# Patient Record
Sex: Male | Born: 1965 | Race: Black or African American | Hispanic: No | Marital: Single
Health system: Southern US, Community
[De-identification: ages and names within clinical notes are randomized; demographics above are authoritative.]

## PROBLEM LIST (undated history)

## (undated) DIAGNOSIS — K635 Polyp of colon: Secondary | ICD-10-CM

## (undated) DIAGNOSIS — I420 Dilated cardiomyopathy: Secondary | ICD-10-CM

## (undated) DIAGNOSIS — I5022 Chronic systolic (congestive) heart failure: Secondary | ICD-10-CM

## (undated) DIAGNOSIS — G629 Polyneuropathy, unspecified: Secondary | ICD-10-CM

## (undated) DIAGNOSIS — F1010 Alcohol abuse, uncomplicated: Secondary | ICD-10-CM

## (undated) DIAGNOSIS — I10 Essential (primary) hypertension: Secondary | ICD-10-CM

## (undated) DIAGNOSIS — Z85038 Personal history of other malignant neoplasm of large intestine: Secondary | ICD-10-CM

## (undated) DIAGNOSIS — K7460 Unspecified cirrhosis of liver: Secondary | ICD-10-CM

## (undated) DIAGNOSIS — I471 Supraventricular tachycardia, unspecified: Secondary | ICD-10-CM

## (undated) DIAGNOSIS — K279 Peptic ulcer, site unspecified, unspecified as acute or chronic, without hemorrhage or perforation: Secondary | ICD-10-CM

## (undated) DIAGNOSIS — R9431 Abnormal electrocardiogram [ECG] [EKG]: Secondary | ICD-10-CM

## (undated) DIAGNOSIS — R931 Abnormal findings on diagnostic imaging of heart and coronary circulation: Secondary | ICD-10-CM

## (undated) DIAGNOSIS — R079 Chest pain, unspecified: Secondary | ICD-10-CM

## (undated) DIAGNOSIS — K219 Gastro-esophageal reflux disease without esophagitis: Secondary | ICD-10-CM

## (undated) DIAGNOSIS — C189 Malignant neoplasm of colon, unspecified: Secondary | ICD-10-CM

## (undated) DIAGNOSIS — K859 Acute pancreatitis without necrosis or infection, unspecified: Secondary | ICD-10-CM

## (undated) DIAGNOSIS — K7010 Alcoholic hepatitis without ascites: Secondary | ICD-10-CM

## (undated) HISTORY — DX: Chest pain, unspecified: R07.9

## (undated) HISTORY — DX: Personal history of other malignant neoplasm of large intestine: Z85.038

## (undated) HISTORY — DX: Dilated cardiomyopathy: I42.0

## (undated) HISTORY — DX: Supraventricular tachycardia, unspecified: I47.10

## (undated) HISTORY — DX: Abnormal electrocardiogram (ECG) (EKG): R94.31

## (undated) HISTORY — DX: Supraventricular tachycardia: I47.1

## (undated) HISTORY — DX: Other disorders of phosphorus metabolism: E83.39

## (undated) HISTORY — DX: Alcoholic hepatitis without ascites: K70.10

## (undated) HISTORY — DX: Abnormal findings on diagnostic imaging of heart and coronary circulation: R93.1

## (undated) HISTORY — DX: Hypomagnesemia: E83.42

## (undated) HISTORY — DX: Polyp of colon: K63.5

## (undated) HISTORY — DX: Unspecified cirrhosis of liver: K74.60

## (undated) HISTORY — DX: Chronic systolic (congestive) heart failure: I50.22

---
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
## 1998-01-31 ENCOUNTER — Emergency Department (HOSPITAL_COMMUNITY): Admission: EM | Admit: 1998-01-31 | Discharge: 1998-01-31 | Payer: Self-pay | Admitting: Emergency Medicine

## 1999-05-11 ENCOUNTER — Encounter: Payer: Self-pay | Admitting: Emergency Medicine

## 1999-05-11 ENCOUNTER — Emergency Department (HOSPITAL_COMMUNITY): Admission: EM | Admit: 1999-05-11 | Discharge: 1999-05-11 | Payer: Self-pay | Admitting: Emergency Medicine

## 1999-08-18 ENCOUNTER — Emergency Department (HOSPITAL_COMMUNITY): Admission: EM | Admit: 1999-08-18 | Discharge: 1999-08-18 | Payer: Self-pay | Admitting: Emergency Medicine

## 1999-12-26 ENCOUNTER — Emergency Department (HOSPITAL_COMMUNITY): Admission: EM | Admit: 1999-12-26 | Discharge: 1999-12-27 | Payer: Self-pay | Admitting: Emergency Medicine

## 2001-02-16 ENCOUNTER — Emergency Department (HOSPITAL_COMMUNITY): Admission: EM | Admit: 2001-02-16 | Discharge: 2001-02-16 | Payer: Self-pay | Admitting: Emergency Medicine

## 2001-02-16 ENCOUNTER — Encounter: Payer: Self-pay | Admitting: Emergency Medicine

## 2003-10-20 ENCOUNTER — Emergency Department (HOSPITAL_COMMUNITY): Admission: EM | Admit: 2003-10-20 | Discharge: 2003-10-20 | Payer: Self-pay | Admitting: Emergency Medicine

## 2005-10-03 HISTORY — PX: OTHER SURGICAL HISTORY: SHX169

## 2005-10-03 HISTORY — PX: LAPAROSCOPIC SIGMOID COLECTOMY: SHX5928

## 2005-12-06 ENCOUNTER — Ambulatory Visit: Payer: Self-pay | Admitting: Internal Medicine

## 2005-12-06 ENCOUNTER — Ambulatory Visit: Payer: Self-pay | Admitting: Infectious Diseases

## 2005-12-06 ENCOUNTER — Inpatient Hospital Stay (HOSPITAL_COMMUNITY): Admission: EM | Admit: 2005-12-06 | Discharge: 2005-12-13 | Payer: Self-pay | Admitting: *Deleted

## 2005-12-07 ENCOUNTER — Encounter (INDEPENDENT_AMBULATORY_CARE_PROVIDER_SITE_OTHER): Payer: Self-pay | Admitting: Specialist

## 2005-12-08 ENCOUNTER — Encounter (INDEPENDENT_AMBULATORY_CARE_PROVIDER_SITE_OTHER): Payer: Self-pay | Admitting: *Deleted

## 2005-12-13 ENCOUNTER — Ambulatory Visit: Payer: Self-pay | Admitting: Hematology and Oncology

## 2006-01-05 ENCOUNTER — Emergency Department (HOSPITAL_COMMUNITY): Admission: EM | Admit: 2006-01-05 | Discharge: 2006-01-05 | Payer: Self-pay | Admitting: Emergency Medicine

## 2006-01-10 LAB — CBC WITH DIFFERENTIAL/PLATELET
BASO%: 1.8 % (ref 0.0–2.0)
Basophils Absolute: 0.1 10*3/uL (ref 0.0–0.1)
HCT: 47.2 % (ref 38.7–49.9)
HGB: 16 g/dL (ref 13.0–17.1)
MONO#: 0.4 10*3/uL (ref 0.1–0.9)
NEUT#: 3.1 10*3/uL (ref 1.5–6.5)
NEUT%: 61.9 % (ref 40.0–75.0)
WBC: 5.1 10*3/uL (ref 4.0–10.0)
lymph#: 1.3 10*3/uL (ref 0.9–3.3)

## 2006-01-10 LAB — COMPREHENSIVE METABOLIC PANEL WITH GFR
ALT: 8 U/L (ref 0–40)
AST: 15 U/L (ref 0–37)
Albumin: 4.7 g/dL (ref 3.5–5.2)
Alkaline Phosphatase: 55 U/L (ref 39–117)
BUN: 11 mg/dL (ref 6–23)
CO2: 26 meq/L (ref 19–32)
Calcium: 9.9 mg/dL (ref 8.4–10.5)
Chloride: 104 meq/L (ref 96–112)
Creatinine, Ser: 0.9 mg/dL (ref 0.4–1.5)
Glucose, Bld: 85 mg/dL (ref 70–99)
Potassium: 4.1 meq/L (ref 3.5–5.3)
Sodium: 140 meq/L (ref 135–145)
Total Bilirubin: 0.6 mg/dL (ref 0.3–1.2)
Total Protein: 7.8 g/dL (ref 6.0–8.3)

## 2006-01-10 LAB — CEA: CEA: 2.4 ng/mL (ref 0.0–5.0)

## 2006-01-19 ENCOUNTER — Ambulatory Visit (HOSPITAL_BASED_OUTPATIENT_CLINIC_OR_DEPARTMENT_OTHER): Admission: RE | Admit: 2006-01-19 | Discharge: 2006-01-19 | Payer: Self-pay | Admitting: General Surgery

## 2006-02-05 ENCOUNTER — Ambulatory Visit: Payer: Self-pay | Admitting: Hematology and Oncology

## 2006-02-08 LAB — CBC WITH DIFFERENTIAL/PLATELET
Basophils Absolute: 0 10*3/uL (ref 0.0–0.1)
EOS%: 2.2 % (ref 0.0–7.0)
HCT: 44.9 % (ref 38.7–49.9)
HGB: 15.5 g/dL (ref 13.0–17.1)
LYMPH%: 29.2 % (ref 14.0–48.0)
MCH: 32.6 pg (ref 28.0–33.4)
MCV: 94.2 fL (ref 81.6–98.0)
MONO%: 13.2 % — ABNORMAL HIGH (ref 0.0–13.0)
NEUT%: 54.9 % (ref 40.0–75.0)

## 2006-02-08 LAB — COMPREHENSIVE METABOLIC PANEL
AST: 27 U/L (ref 0–37)
Alkaline Phosphatase: 54 U/L (ref 39–117)
BUN: 8 mg/dL (ref 6–23)
Creatinine, Ser: 0.9 mg/dL (ref 0.4–1.5)

## 2006-03-01 LAB — COMPREHENSIVE METABOLIC PANEL
Albumin: 4.5 g/dL (ref 3.5–5.2)
CO2: 24 mEq/L (ref 19–32)
Calcium: 9.8 mg/dL (ref 8.4–10.5)
Chloride: 101 mEq/L (ref 96–112)
Glucose, Bld: 123 mg/dL — ABNORMAL HIGH (ref 70–99)
Sodium: 134 mEq/L — ABNORMAL LOW (ref 135–145)
Total Bilirubin: 0.5 mg/dL (ref 0.3–1.2)
Total Protein: 7.6 g/dL (ref 6.0–8.3)

## 2006-03-01 LAB — CBC WITH DIFFERENTIAL/PLATELET
Eosinophils Absolute: 0.1 10*3/uL (ref 0.0–0.5)
HCT: 44.7 % (ref 38.7–49.9)
LYMPH%: 28 % (ref 14.0–48.0)
MONO#: 0.5 10*3/uL (ref 0.1–0.9)
NEUT#: 1.4 10*3/uL — ABNORMAL LOW (ref 1.5–6.5)
NEUT%: 48.8 % (ref 40.0–75.0)
Platelets: 200 10*3/uL (ref 145–400)
RBC: 4.74 10*6/uL (ref 4.20–5.71)
WBC: 2.8 10*3/uL — ABNORMAL LOW (ref 4.0–10.0)
lymph#: 0.8 10*3/uL — ABNORMAL LOW (ref 0.9–3.3)

## 2006-03-01 LAB — MAGNESIUM: Magnesium: 2.8 mg/dL — ABNORMAL HIGH (ref 1.5–2.5)

## 2006-03-17 ENCOUNTER — Ambulatory Visit: Payer: Self-pay | Admitting: Hematology and Oncology

## 2006-03-28 LAB — CBC WITH DIFFERENTIAL/PLATELET
BASO%: 1 % (ref 0.0–2.0)
Basophils Absolute: 0.1 10*3/uL (ref 0.0–0.1)
EOS%: 1.2 % (ref 0.0–7.0)
HCT: 45 % (ref 38.7–49.9)
HGB: 15.5 g/dL (ref 13.0–17.1)
MCH: 32.9 pg (ref 28.0–33.4)
MCHC: 34.4 g/dL (ref 32.0–35.9)
MONO#: 1.4 10*3/uL — ABNORMAL HIGH (ref 0.1–0.9)
NEUT%: 63.8 % (ref 40.0–75.0)
RDW: 14.3 % (ref 11.2–14.6)
WBC: 8.9 10*3/uL (ref 4.0–10.0)
lymph#: 1.6 10*3/uL (ref 0.9–3.3)

## 2006-03-28 LAB — COMPREHENSIVE METABOLIC PANEL
ALT: 9 U/L (ref 0–40)
AST: 16 U/L (ref 0–37)
Albumin: 4.4 g/dL (ref 3.5–5.2)
CO2: 23 mEq/L (ref 19–32)
Calcium: 9.2 mg/dL (ref 8.4–10.5)
Chloride: 107 mEq/L (ref 96–112)
Potassium: 4.3 mEq/L (ref 3.5–5.3)
Total Protein: 7.1 g/dL (ref 6.0–8.3)

## 2006-04-11 LAB — CBC WITH DIFFERENTIAL/PLATELET
BASO%: 0.6 % (ref 0.0–2.0)
EOS%: 1.9 % (ref 0.0–7.0)
HCT: 45.6 % (ref 38.7–49.9)
MCH: 33 pg (ref 28.0–33.4)
MCHC: 34.1 g/dL (ref 32.0–35.9)
MONO#: 1.1 10*3/uL — ABNORMAL HIGH (ref 0.1–0.9)
NEUT%: 62.8 % (ref 40.0–75.0)
RBC: 4.71 10*6/uL (ref 4.20–5.71)
WBC: 8.1 10*3/uL (ref 4.0–10.0)
lymph#: 1.7 10*3/uL (ref 0.9–3.3)

## 2006-04-11 LAB — COMPREHENSIVE METABOLIC PANEL
ALT: 10 U/L (ref 0–40)
AST: 16 U/L (ref 0–37)
Albumin: 4.5 g/dL (ref 3.5–5.2)
CO2: 24 mEq/L (ref 19–32)
Calcium: 9.1 mg/dL (ref 8.4–10.5)
Chloride: 105 mEq/L (ref 96–112)
Potassium: 4.1 mEq/L (ref 3.5–5.3)
Sodium: 139 mEq/L (ref 135–145)
Total Protein: 7.2 g/dL (ref 6.0–8.3)

## 2006-05-05 ENCOUNTER — Ambulatory Visit: Payer: Self-pay | Admitting: Hematology and Oncology

## 2006-05-09 LAB — COMPREHENSIVE METABOLIC PANEL
ALT: 16 U/L (ref 0–40)
AST: 21 U/L (ref 0–37)
Calcium: 9.8 mg/dL (ref 8.4–10.5)
Creatinine, Ser: 0.9 mg/dL (ref 0.40–1.50)
Glucose, Bld: 102 mg/dL — ABNORMAL HIGH (ref 70–99)
Potassium: 4.5 mEq/L (ref 3.5–5.3)
Total Bilirubin: 0.7 mg/dL (ref 0.3–1.2)
Total Protein: 7.5 g/dL (ref 6.0–8.3)

## 2006-05-09 LAB — CBC WITH DIFFERENTIAL/PLATELET
Basophils Absolute: 0 10*3/uL (ref 0.0–0.1)
Eosinophils Absolute: 0.1 10*3/uL (ref 0.0–0.5)
HCT: 45.1 % (ref 38.7–49.9)
LYMPH%: 12.4 % — ABNORMAL LOW (ref 14.0–48.0)
MCH: 34.8 pg — ABNORMAL HIGH (ref 28.0–33.4)
MCHC: 34.4 g/dL (ref 32.0–35.9)
MONO#: 0.3 10*3/uL (ref 0.1–0.9)
MONO%: 6 % (ref 0.0–13.0)
NEUT%: 79.7 % — ABNORMAL HIGH (ref 40.0–75.0)
Platelets: 219 10*3/uL (ref 145–400)
RDW: 13.3 % (ref 11.2–14.6)
lymph#: 0.7 10*3/uL — ABNORMAL LOW (ref 0.9–3.3)

## 2006-05-22 LAB — CBC WITH DIFFERENTIAL/PLATELET
LYMPH%: 33.6 % (ref 14.0–48.0)
MONO#: 0.7 10*3/uL (ref 0.1–0.9)
MONO%: 15.4 % — ABNORMAL HIGH (ref 0.0–13.0)
Platelets: 196 10*3/uL (ref 145–400)
WBC: 4.4 10*3/uL (ref 4.0–10.0)

## 2006-05-22 LAB — COMPREHENSIVE METABOLIC PANEL
ALT: 16 U/L (ref 0–40)
AST: 24 U/L (ref 0–37)
Albumin: 4.5 g/dL (ref 3.5–5.2)
CO2: 26 mEq/L (ref 19–32)
Chloride: 103 mEq/L (ref 96–112)
Creatinine, Ser: 0.99 mg/dL (ref 0.40–1.50)
Glucose, Bld: 108 mg/dL — ABNORMAL HIGH (ref 70–99)
Total Protein: 7.3 g/dL (ref 6.0–8.3)

## 2006-06-19 ENCOUNTER — Ambulatory Visit: Payer: Self-pay | Admitting: Hematology and Oncology

## 2006-06-19 LAB — COMPREHENSIVE METABOLIC PANEL
AST: 19 U/L (ref 0–37)
Albumin: 4.9 g/dL (ref 3.5–5.2)
BUN: 9 mg/dL (ref 6–23)
CO2: 24 mEq/L (ref 19–32)
Calcium: 10.4 mg/dL (ref 8.4–10.5)
Chloride: 104 mEq/L (ref 96–112)
Creatinine, Ser: 0.93 mg/dL (ref 0.40–1.50)
Glucose, Bld: 87 mg/dL (ref 70–99)
Potassium: 4.4 mEq/L (ref 3.5–5.3)

## 2006-06-19 LAB — CBC WITH DIFFERENTIAL/PLATELET
Basophils Absolute: 0 10*3/uL (ref 0.0–0.1)
EOS%: 1.1 % (ref 0.0–7.0)
HCT: 49.8 % (ref 38.7–49.9)
HGB: 17.2 g/dL — ABNORMAL HIGH (ref 13.0–17.1)
LYMPH%: 31.2 % (ref 14.0–48.0)
MCHC: 34.6 g/dL (ref 32.0–35.9)
NEUT%: 52.2 % (ref 40.0–75.0)
RBC: 4.93 10*6/uL (ref 4.20–5.71)
RDW: 13.1 % (ref 11.2–14.6)
WBC: 4.6 10*3/uL (ref 4.0–10.0)

## 2006-10-16 ENCOUNTER — Ambulatory Visit: Payer: Self-pay | Admitting: Hematology and Oncology

## 2007-08-31 ENCOUNTER — Ambulatory Visit (HOSPITAL_COMMUNITY): Admission: RE | Admit: 2007-08-31 | Discharge: 2007-08-31 | Payer: Self-pay | Admitting: Hematology and Oncology

## 2007-08-31 ENCOUNTER — Ambulatory Visit: Payer: Self-pay | Admitting: Hematology and Oncology

## 2007-08-31 LAB — CBC WITH DIFFERENTIAL/PLATELET
BASO%: 0.5 % (ref 0.0–2.0)
Basophils Absolute: 0 10*3/uL (ref 0.0–0.1)
EOS%: 1.8 % (ref 0.0–7.0)
HGB: 15.6 g/dL (ref 13.0–17.1)
MCH: 34.1 pg — ABNORMAL HIGH (ref 28.0–33.4)
MCHC: 35.1 g/dL (ref 32.0–35.9)
RBC: 4.57 10*6/uL (ref 4.20–5.71)
RDW: 13 % (ref 11.2–14.6)
lymph#: 1.6 10*3/uL (ref 0.9–3.3)

## 2007-08-31 LAB — COMPREHENSIVE METABOLIC PANEL
Alkaline Phosphatase: 52 U/L (ref 39–117)
Glucose, Bld: 96 mg/dL (ref 70–99)
Sodium: 135 mEq/L (ref 135–145)
Total Bilirubin: 0.8 mg/dL (ref 0.3–1.2)
Total Protein: 7.9 g/dL (ref 6.0–8.3)

## 2007-10-15 ENCOUNTER — Ambulatory Visit (HOSPITAL_COMMUNITY): Admission: RE | Admit: 2007-10-15 | Discharge: 2007-10-15 | Payer: Self-pay | Admitting: General Surgery

## 2007-10-22 ENCOUNTER — Ambulatory Visit: Payer: Self-pay | Admitting: Internal Medicine

## 2007-10-29 ENCOUNTER — Ambulatory Visit: Payer: Self-pay | Admitting: Internal Medicine

## 2008-03-05 ENCOUNTER — Ambulatory Visit: Payer: Self-pay | Admitting: Hematology and Oncology

## 2009-01-13 ENCOUNTER — Emergency Department (HOSPITAL_COMMUNITY): Admission: EM | Admit: 2009-01-13 | Discharge: 2009-01-13 | Payer: Self-pay | Admitting: Family Medicine

## 2010-02-18 ENCOUNTER — Emergency Department (HOSPITAL_COMMUNITY): Admission: EM | Admit: 2010-02-18 | Discharge: 2010-02-18 | Payer: Self-pay | Admitting: Emergency Medicine

## 2010-10-03 HISTORY — PX: OPEN REDUCTION INTERNAL FIXATION (ORIF) HAND: SHX5991

## 2010-10-24 ENCOUNTER — Encounter: Payer: Self-pay | Admitting: Hematology and Oncology

## 2011-01-12 LAB — COMPREHENSIVE METABOLIC PANEL
ALT: 102 U/L — ABNORMAL HIGH (ref 0–53)
AST: 195 U/L — ABNORMAL HIGH (ref 0–37)
Albumin: 4 g/dL (ref 3.5–5.2)
BUN: 8 mg/dL (ref 6–23)
CO2: 23 mEq/L (ref 19–32)
Creatinine, Ser: 0.96 mg/dL (ref 0.4–1.5)
GFR calc Af Amer: >60 mL/min (ref >60)
GFR calc non Af Amer: >60 mL/min (ref >60)
Potassium: 4.1 mEq/L (ref 3.5–5.1)
Sodium: 135 mEq/L (ref 135–145)
Total Bilirubin: 0.7 mg/dL (ref 0.3–1.2)
Total Protein: 7.6 g/dL (ref 6.0–8.3)

## 2011-01-12 LAB — DIFFERENTIAL
Lymphocytes Relative: 24 % (ref 12–46)
Lymphs Abs: 1 10*3/uL (ref 0.7–4.0)
Monocytes Relative: 15 % — ABNORMAL HIGH (ref 3–12)
Neutrophils Relative %: 59 % (ref 43–77)

## 2011-01-12 LAB — POCT URINALYSIS DIP (DEVICE)
Glucose, UA: NEGATIVE mg/dL
Hgb urine dipstick: NEGATIVE
Ketones, ur: 15 mg/dL — AB
Nitrite: NEGATIVE
Specific Gravity, Urine: 1.02 (ref 1.005–1.030)
pH: 5.5 (ref 5.0–8.0)

## 2011-01-12 LAB — CBC
MCV: 98.8 fL (ref 78.0–100.0)
Platelets: 150 10*3/uL (ref 150–400)
WBC: 3.9 10*3/uL — ABNORMAL LOW (ref 4.0–10.5)

## 2011-02-15 NOTE — Op Note (Signed)
NAMEMAHKI, SPIKES                 ACCOUNT NO.:  [id number]   MEDICAL RECORD NO.:  [id number]          PATIENT TYPE:  AMB   LOCATION:  DAY                          FACILITY:  [facility]   PHYSICIAN:  Adolph Pollack, M.D.DATE OF BIRTH:  12-28-1965   DATE OF PROCEDURE:  10/15/2007  DATE OF DISCHARGE:                               OPERATIVE REPORT   PREOPERATIVE DIAGNOSIS:  Retained Port-A-Cath.   POSTOPERATIVE DIAGNOSIS:  Retained Port-A-Cath.   PROCEDURE:  Port-A-Cath removal.   SURGEON:  Adolph Pollack, M.D.   ANESTHESIA:  MAC plus local (1% lidocaine).   INDICATIONS:  A 41-year male had colon cancer, required chemotherapy,  and has a retained Port-A-Cath in.  He does not require any further  therapy at this time, thus he is here to have Port-A-Cath removal.  I  discussed the procedure risks preoperatively.   TECHNIQUE:  He was seen in the holding area and the left chest with  marked my initials just above the port site.  He was then brought to the  operating room, placed supine on the operating table, and given  intravenous sedation.  The left upper chest was sterilely prepped and  draped.  Local anesthetic was infiltrated at the site of the previous  chest wall incision superficially and deep.  The previous incision was  reincised through skin and subcutaneous tissue.  The catheter was  identified and the fibrin sheath dissected free from it.  The catheter  was then removed from the left subclavian vein and direct pressure was  held at this area for 10 minutes.  Using electrocautery I then dissected  the port free from the fibrous capsule and underlying subcutaneous  tissue and the catheter and the port were removed in their entirety.   I then injected Marcaine solution into the deep area of the wound.   Hemostasis was obtained using electrocautery.  Once hemostasis was  adequate, the wound was closed in two layers.  The subcutaneous tissue  was approximated with a  running 2-0 Vicryl suture and skin closed with a  4-0 Monocryl subcuticular stitch.  Steri-Strips and sterile dressings  were applied.   He tolerated the procedure well without any apparent complications.  He  was taken to recovery in satisfactory condition.      Adolph Pollack, M.D.  Electronically Signed    TJR/MEDQ  D:  10/15/2007  T:  10/16/2007  Job:  [id number]

## 2011-02-18 NOTE — Discharge Summary (Signed)
NAMEBRAIDON, CHERMAK                 ACCOUNT NO.:  [id number]   MEDICAL RECORD NO.:  [id number]          PATIENT TYPE:  INP   LOCATION:  0981                         FACILITY:  [facility]   PHYSICIAN:  Ronda Fairly, M.D.    DATE OF BIRTH:  08-Feb-1966   DATE OF ADMISSION:  12/06/2005  DATE OF DISCHARGE:  12/13/2005                                 DISCHARGE SUMMARY   DISCHARGE DIAGNOSES:  1.  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  2.  Polysubstance abuse.   DISCHARGE MEDICATIONS:  1.  Protonix 40 mg daily.  2.  Nicotine patch daily.  3.  Carafate 1 g q. 6 h p.r.n. for abdominal distention.   CONDITION ON DISCHARGE AND FOLLOWUP:  The patient was stable at time of  discharge.  He had recovered very well from his abdominal surgery.  He will  follow up with Dr. Abbey Chatters from general surgery, and he also has a  followup with Dr. Dalene Carrow at the [facility] in 2 to 4 weeks'  time.  The patient will be called  for the date.   PROCEDURES AND IMPORTANT DIAGNOSTIC STUDIES:  1.  Colonoscopy was done which showed sigmoid diverticula but most important      a large bleeding obstructing mass of the descending colon with      appearance consistent with carcinoma.  2.  CT scan of the abdomen showed area of focal thickening at the splenic      flexure that was concerning for a neoplasm.   ADMISSION HISTORY AND PHYSICAL:  Mr. Jaquith is a 45 year old African-American  male with a known family history of colon cancer who was admitted on March 6  with history of abdominal pain and a 3-week history of black tarry stools  which had become severe for the last 1 to 2 days.  He also had a history of  black tarry stools for the same period. He gives a history of about a 10-  pound weight loss during the last couple of months.  He has been previously  treated with Nexium for epigastric pain, but he stopped taking that some  time ago.   ALLERGIES:  PENICILLIN.  He has hives.   PAST MEDICAL HISTORY:  1.  Peptic ulcer disease with ulceration beginning at the age of 45 but no      EGD done.  2.  History of alcohol abuse.  3.  History of hernia repair at the age of 45.  4.  History of GERD.   MEDICATIONS:  Nexium 40 mg daily which he stopped taking 2 years ago.   SUBSTANCE HISTORY:  He currently smokes 1 pack per day for the last 18  years.  Alcohol: 3 to 4 drinks a day for the past 5 to 6 months.  Smokes  marijuana occasionally.  Is single, has 11th grade education, is self paid,  lives with his girlfriend.   FAMILY HISTORY:  Significant for colon cancer in his father.   REVIEW OF SYSTEMS:  Positive for weight loss.  Over the last 5 years, he has  lost about 35 pounds.  History of abdominal pain, nausea, and poor appetite.   PHYSICAL EXAMINATION:  VITAL SIGNS:  Temperature 97.6, blood pressure  174/135, pulse 71, respiratory rate 22, O2 saturation 100%.  GENERAL:  Not in acute distress.  EYES:  Pupils equal and reactive.  Extraocular movements intact.  ENT: No thrush, no tonsillar adenopathy.  NECK: Supple, no JVD or thyromegaly.  RESPIRATORY:  Clear to auscultation bilaterally.  CARDIOVASCULAR: Regular rate and rhythm.  ABDOMEN: Soft, generalized diffuse tenderness on deep palpation noted.  No  guarding. Bowel sounds present.  RECTAL:  Fecal occult blood positive, maroon colored stools with red  streaks.  SKIN:  Normal.  NEUROLOGIC: Normal.   ADMISSION LABORATORY DATA:  Hemoglobin 18.5, hematocrit 4.8, WBC 5.8,  platelets 250, ANC 3.3, MCV 100.  Sodium 138, potassium 2.8, chloride 101,  bicarb 26, BUN 7, creatinine 1.1, glucose 80.  Bilirubin 1.1, alkaline  phosphatase 62, SGOT 24, SGPT 14, protein 10.7, albumin 3.9, calcium 9.1.  lipase 20.  Fecal occult blood positive.  PT 13.1, INR 1.   HOSPITAL COURSE:  #1.  ADENOCARCINOMA OF THE COLON STATUS POST COLECTOMY: Mr. Osgood is a 45-  year-old  male with a family history of colon cancer and a history of tobacco  and alcohol abuse, presents with a history of abdominal pain, black tarry  stools, and weight loss.  In the ED, a CT scan of the abdomen was done which  showed an area of focal thinning of the splenic flexure of the colon that is  concerning for neoplasm.  GI was consulted.  Dr. Marina Goodell saw him, and patient  underwent colonoscopy which revealed a large, bleeding, obstructing mass of  the descending colon with appearance consistent with carcinoma.  Biopsy of  the mass was taken.  Dr. Abbey Chatters, from general surgery, did left  colectomy and also mobilization of the splenic flexure.  The patient  tolerated the procedure well.  Post surgery, he did have some abdominal  distention and an ileus which gradually resolved during the course of  hospitalization.  At the time of discharge, he had recovered significantly  from the surgery with normal vital signs, was tolerating p.o. as well and  was pain free off the pain medications.  Dr. Dalene Carrow, from the [facility], was consulted with regard to further management of the colon  cancer.  By that time, the biopsy results had come back which showed  invasive adenocarcinoma of the left colon.  The sigmoid colon was negative.  The mass was about 6.5 cm moderately differentiated adenocarcinoma with  focal mucinous features.  The surgical margins were free of tumor.  The 18-  30 __________  lymph nodes were free of tumor.  This was most likely T3, N0,  MX moderately differentiated adenocarcinoma of the colon with mucinous  features.  As Dr. Dalene Carrow will see the patient in about 3 to 4 weeks' time  for further management.  The patient will be called for the interview date.  Mr. Freeland will probably need endoscopy in the future because he main  complaint at the time of admission was black tarry stools which, considering history of GERD, he could have an ulcer, although the H. pylori  test was  negative.   Mr. Pittinger will also need a repeat colonoscopy to search for synchronous and  metachronous lesions beyond the site of the tumor.   #2.  ALCOHOL ABUSE:  Mr. Schwarz has a significant history of alcohol and  tobacco abuse.  During the hospitalization, he promised he would stop  drinking and smoking.  He was put on a nicotine patch which he tolerated  well.   DISCHARGE LABORATORY DATA AND VITAL SIGNS:  At the time of discharge, Mr.  Ambrocio was clinically stable.  His blood pressure was 130/78, pulse 77,  temperature 97.4.  Hemoglobin 14.1, hematocrit 40.8, WBC 4.5, platelets 305.  Sodium 136, potassium 4, chloride 104, bicarb 27, BUN 2, creatinine 0.9,  glucose 80.  He is being sent home with the understanding that he would  follow up with Dr. Dalene Carrow at the [facility] in 3 weeks' time.      Ronda Fairly, M.D.     Margreta Journey  D:  01/25/2006  T:  01/25/2006  Job:  [id number]   cc:   Adolph Pollack, M.D.  [address], [address]  [place]  [place] [address]   Wilhemina Bonito. Marina Goodell, M.D. [facility]  [address]  [place]  [place] [address]   Vicente Serene I. Odogwu, M.D.  Fax: [phone]

## 2011-02-18 NOTE — Consult Note (Signed)
Richard [name], WEYENBERG                 ACCOUNT NO.:  [id number]   MEDICAL RECORD NO.:  [id number]          PATIENT TYPE:  INP   LOCATION:  5034                         FACILITY:  [facility]   PHYSICIAN:  Lauretta I. Odogwu, M.D.DATE OF BIRTH:  10-19-1965   DATE OF CONSULTATION:  12/12/2005  DATE OF DISCHARGE:                                   CONSULTATION   REASON FOR CONSULTATION:  Colon cancer.   REFERRING PHYSICIAN:  Teaching service   HISTORY OF PRESENT ILLNESS:  Mr. Gignac is a pleasant 45 year old African-  American male with a known family history of colon cancer admitted through  the emergency department on December 06, 2005 with GI bleed, right upper  abdominal pain, and dehydration as well as blood in the stools.  CT of the  abdomen was performed demonstrated a focal colonic soft tissue thickening  suspicious for neoplasm along with small bowel intussusception.  Pelvic CT  was essentially unremarkable with the exception of a small amount of free  fluid and bilateral hydrocele.  He underwent colonoscopy with biopsy of the  colonic mass on December 07, 2005.  Pathology report case #[id number], demonstrated  an invasive adenocarcinoma of the left colon.  The sigmoid colon was  negative.  He then underwent a left partial colectomy with mobilization of  the left splenic flexure and lymph node resection.  Pathology report case  #[id number], demonstrated a 6.5 cm moderatelydifferentiated invasive  adenocarcinoma, with focal mucinous features. The surgical margins were free  of tumor. 18 peri-intestinal lymph nodes free of tumor. We were asked to see  the patient with recommendations regarding his care.   PAST MEDICAL HISTORY:  1.  Colon cancer as above.  2.  History of tobacco abuse.  3.  Alcohol habituation.  4.  History of hydrocele.   SURGERIES:  1.  Status post left colectomy Dr. Abbey Chatters December 08, 2005.  2.  Status post hernia repair age 37.   ALLERGIES:  PENICILLIN.   CURRENT  MEDICATIONS:  1.  Dilaudid 7.5 mg IV q.4h. PCA.  2.  Nicoderm patch TD daily.  3.  Protonix 40 mg daily.  4.  Carafate 1 g q.6h.  5.  Benadryl p.r.n.  6.  Ativan p.r.n.  7.  Reglan p.r.n.  8.  Narcan p.r.n.  9.  Zofran p.r.n.  10. Percocet p.r.n.  11. Compazine p.r.n.  12. Phenergan p.r.n.   REVIEW OF SYSTEMS:  Remarkable for fatigue and weight loss of 10 pounds over  the last two months accompanied with poor appetite, abdominal pain  intermittent, especially prior to admission, blood in the stools as well as  flatulence.  The rest of the review of systems is negative.   FAMILY HISTORY:  Mother alive and well.  Father died with colon cancer at  age 104.  Two sisters alive with PUD.  One sister died with end-stage renal  disease and two brothers alive with a history of PUD.   SOCIAL HISTORY:  The patient is separated from wife.  He has three children  in good health.  He drinks alcohol sometimes heavily,  about three to four  drinks a day.  Occasional marijuana and smokes tobacco about one pack a day  for the last 20 years.  He lives in [place].   PHYSICAL EXAMINATION:  GENERAL:  This is a well-developed 45 year old  African-American male in no acute distress.  Alert and oriented x3.  VITAL SIGNS:  Blood pressure 110/53, pulse 87, respirations 18, temperature  97.2, pulse oximetry 100% on room air, weight 145 pounds, height 72 inches.  HEENT:  Normocephalic, atraumatic.  PERRLA.  Oral mucosa without thrush or  lesions.  Poor dentition.  NECK:  Supple.  No cervical or supraclavicular masses.  LUNGS:  Clear to auscultation bilaterally.  No axillary masses.  CARDIOVASCULAR:  Regular rate and rhythm without murmurs, rubs, or gallops.  ABDOMEN:  Slightly distended, nontender.  Bowel sounds x4, active.  No  palpable spleen or liver.  GENITOURINARY:  Deferred.  RECTAL:  Deferred.  EXTREMITIES:  No clubbing or cyanosis.  No edema.  SKIN:  With several tattoos.  No bruising or  petechiae.  NEUROLOGIC:  Nonfocal.   LABORATORIES:  Hemoglobin 14.1, hematocrit 40.8, white count 4.5, platelets  305, neutrophils 3.3, monos 0.8, MCV 99.8.  PT 13.4, PTT 31, INR 1.  Sodium  136, potassium 4, BUN 2, creatinine 0.9, glucose 90, total bilirubin 1.1,  alkaline phosphatase 62, AST 24, ALT 14, total protein 7.7, albumin 3.9,  calcium 8.8.  CEA 6.2 preoperatively.   ASSESSMENT/PLAN:  This is a 45 year old African-American male seen for  evaluation of T3 N0 MX moderately differentiated adenocarcinoma of the colon  with mucinous features.  Dr. Dalene Carrow is aware of the patient admission and  after discussion with her and formal evaluation by myself, Marlowe Kays,  P.A.-C. for Dr. Dalene Carrow it has been  decided that the patient will follow up as an out patient in the [facility], three to four weeks from now.  He patient hasbeen given an  appointment. The patient knows to call if he has any questions or concerns  or if he needed to be seen sooner.  Thank you very much for allowing [place] the  participate in the care of Mr. Manfred.      Marlowe Kays, P.A.      Lauretta I. Odogwu, M.D.  Electronically Signed    SW/MEDQ  D:  12/13/2005  T:  12/13/2005  Job:  [id number]   cc:   Wilhemina Bonito. Marina Goodell, M.D. [facility]  [address]  [place]  [place] [address]   Adolph Pollack, M.D.  [address]  [place]  [place] [address]

## 2011-02-18 NOTE — Op Note (Signed)
Richard [name], Richard [name]                 ACCOUNT NO.:  [id number]   MEDICAL RECORD NO.:  [id number]          PATIENT TYPE:  INP   LOCATION:  3714                         FACILITY:  [facility]   PHYSICIAN:  Adolph Pollack, M.D.DATE OF BIRTH:  Oct 08, 1965   DATE OF PROCEDURE:  12/08/2005  DATE OF DISCHARGE:                                 OPERATIVE REPORT   PREOP DIAGNOSIS:  Left colon cancer.   POSTOP:  Left colon cancer.   PROCEDURE:  Left colectomy, mobilization of splenic flexure.   SURGEON:  Rosenbower.   ASSISTANT:  Revonda Standard L. Rennis Harding, N.P.   ANESTHESIA:  General.   INDICATIONS:  This is a 45 year old male who came in with lower GI bleeding.  On colonoscopy he had a malignant neoplasm noted in left colon. CT is  negative for metastatic disease to the liver. The lesion was also  responsible for his blood loss.  He is now brought to the operating room for  elective partial colectomy. We discussed the procedure and risks preop.   TECHNIQUE:  He was brought the operating room, placed supine on the  operating table. General anesthetic was administered. A Foley catheter  placed in the bladder. The hair on the abdominal wall was clipped, the area  sterilely prepped and draped. A midline incision was made through skin,  subcutaneous tissue, fascia, peritoneum entering the peritoneal cavity.   Peritoneal cavity was explored. No drop metastases in the pelvis noted.  Liver was smooth without nodules. Gallbladder was nondistended. The splenic  flexure area, firm mass was palpable.   I immobilized the left colon and the sigmoid colon by incising its lateral  attachments. I then carefully mobilized the splenic flexure and noted there  was some adherent omentum to the tumor. I resected this omentum leaving it  en bloc with the tumor. I then picked a point in the mid transverse colon  and divided the colon the GIA stapler. I picked a point in the mid sigmoid  colon, divided the colon here. I  then dissected down to and divided the  mesenteric vessels all the way down to the origin to the main vessels from  the SMA. There is some enlarged lymph nodes were here as well which were  removed with the specimen.  The specimen was handed off the field with the  distal and marked with a suture. There was some bleeding from some of the  fatty tissue around the spleen and I just packed that for the time being.   Following this, I was able to put the transverse colon side-to-side with the  distal sigmoid colon to perform a stapled anastomosis. The remaining  enterotomy was closed with a linear noncutting stapler. The crotch area of  the anastomosis reinforced with silk suture. Anastomosis was patent, viable  under no tension.   Gloves were changed and abdominal cavity was irrigated. I inspected the area  under the spleen and again noticing bleeding and tried some FloSeal but this  failed.  I tried Surgicel but this failed and I then used electrocautery  which helped control bleeding  and then placed Surgicel there and then the  area was hemostatic. This appeared to be a small vessel, one of which I had  clipped when I was mobilizing the splenic flexure. The other of which had  retracted under the spleen.   Following this I made sure that sponge counts were correct. Once they were  reported to be correct, I then closed the fascia with running #1 PDS suture.  The subcutaneous tissue was irrigated and skin closed with staples.   He tolerated the procedure well without apparent complications was taken  recovery in satisfactory condition.      Adolph Pollack, M.D.  Electronically Signed     TJR/MEDQ  D:  12/08/2005  T:  12/09/2005  Job:  [id number]   cc:   Fransisco Hertz, M.D.  Fax: [phone]   Wilhemina Bonito. Marina Goodell, M.D. [facility]  [address]  [place]  [place] [address]

## 2011-02-18 NOTE — Op Note (Signed)
NAMECAYSON, Richard [name]                 ACCOUNT NO.:  [id number]   MEDICAL RECORD NO.:  [id number]          PATIENT TYPE:  INP   LOCATION:  3714                         FACILITY:  [facility]   PHYSICIAN:  Adolph Pollack, M.D.DATE OF BIRTH:  1966-07-04   DATE OF PROCEDURE:  12/08/2005  DATE OF DISCHARGE:                                 OPERATIVE REPORT   PREOP DIAGNOSIS:  Left colon cancer.   POSTOP:  Left colon cancer.   PROCEDURE:  Left colectomy, mobilization of splenic flexure.   SURGEON:  Rosenbower.   ASSISTANT:  Revonda Standard L. Rennis Harding, N.P.   ANESTHESIA:  General.   INDICATIONS:  This is a 45 year old male who came in with lower GI bleeding.  On colonoscopy he had a malignant neoplasm noted in left colon. CT is  negative for metastatic disease to the liver. The lesion was also  responsible for his blood loss.  He is now brought to the operating room for  elective partial colectomy. We discussed the procedure and risks preop.   TECHNIQUE:  He was brought the operating room, placed supine on the  operating table. General anesthetic was administered. A Foley catheter  placed in the bladder. The hair on the abdominal wall was clipped, the area  sterilely prepped and draped. A midline incision was made through skin,  subcutaneous tissue, fascia, peritoneum entering the peritoneal cavity.   Peritoneal cavity was explored. No drop metastases in the pelvis noted.  Liver was smooth without nodules. Gallbladder was nondistended. The splenic  flexure area, firm mass was palpable.   I immobilized the left colon and the sigmoid colon by incising its lateral  attachments. I then carefully mobilized the splenic flexure and noted there  was some adherent omentum to the tumor. I resected this omentum leaving it  en bloc with the tumor. I then picked a point in the mid transverse colon  and divided the colon the GIA stapler. I picked a point in the mid sigmoid  colon, divided the colon here. I  then dissected down to and divided the  mesenteric vessels all the way down to the origin to the main vessels from  the SMA. There is some enlarged lymph nodes were here as well which were  removed with the specimen.  The specimen was handed off the field with the  distal and marked with a suture. There was some bleeding from some of the  fatty tissue around the spleen and I just packed that for the time being.   Following this, I was able to put the transverse colon side-to-side with the  distal sigmoid colon to perform a stapled anastomosis. The remaining  enterotomy was closed with a linear noncutting stapler. The crotch area of  the anastomosis reinforced with silk suture. Anastomosis was patent, viable  under no tension.   Gloves were changed and abdominal cavity was irrigated. I inspected the area  under the spleen and again noticing bleeding and tried some FloSeal but this  failed.  I tried Surgicel but this failed and I then used electrocautery  which helped control bleeding  and then placed Surgicel there and then the  area was hemostatic. This appeared to be a small vessel, one of which I had  clipped when I was mobilizing the splenic flexure. The other of which had  retracted under the spleen.   Following this I made sure that sponge counts were correct. Once they were  reported to be correct, I then closed the fascia with running #1 PDS suture.  The subcutaneous tissue was irrigated and skin closed with staples.   He tolerated the procedure well without apparent complications was taken  recovery in satisfactory condition.      Adolph Pollack, M.D.  Electronically Signed     TJR/MEDQ  D:  12/08/2005  T:  12/09/2005  Job:  [id number]

## 2011-02-18 NOTE — Consult Note (Signed)
Richard [name], Richard [name]                 ACCOUNT NO.:  [id number]   MEDICAL RECORD NO.:  [id number]          PATIENT TYPE:  INP   LOCATION:  3714                         FACILITY:  [facility]   PHYSICIAN:  Revonda Standard L. Rennis Harding, N.P. DATE OF BIRTH:  10/31/1965   DATE OF CONSULTATION:  12/07/2005  DATE OF DISCHARGE:                                   CONSULTATION   Admitting physician is the teaching service.  Requesting physician is Dr.  Marina Goodell, with Gastroenterology.  Consulting surgeon is  Dr. Abbey Chatters.   REASON FOR CONSULTATION:  Bleeding, obstructing descending colon mass,  presumable cancer.   HISTORY OF PRESENT ILLNESS:  Richard [name] is a 45 year old male patient, known  family medical history of colon cancer.  His father was first diagnosed at  age 36 and lived 10 years after the diagnosis.  History has been obtained  from the patient's mother.  Patient is heavily sedated and has been examined  in the endoscopy lab.  His mother states that the patient has had problems  for several years with intermittent right upper quadrant pain, blood in  stools, constipation, weight loss recently with anorexia and fatigue.  He  presented to the ER on December 07, 2007 with complaints of lower GI bleeding.  Hemoglobin was elevated at 18 and he was presumed to be somewhat  hypovolemic.  CT scan was done that showed an area of focal thickening at  the splenic flexure of the colon that was concerning for neoplasm.  GI was  consulted and patient underwent colonoscopy today, which revealed descending  and sigmoid diverticula but most importantly, a large bleeding obstructing  mass of the descending colon with appearance consistent with carcinoma.  We  have been asked to evaluate the patient for colon resection.   REVIEW OF SYSTEMS:  As per the history of present illness.  Again,  progression of these symptoms has been worse over the past 3 or 4 months.  Otherwise, review of systems is negative.  RESPIRATORY:  No  chest pain, no  shortness of breath.  No dyspnea on exertion.  No orthopnea.  GASTROINTESTINAL:  No hematemesis.  No actual nausea or vomiting.  No reflux  symptoms.   FAMILY MEDICAL HISTORY:  Father had history of colon cancer at age 34.  He  is now deceased.   SOCIAL HISTORY:  Patient smokes at least one pack of cigarettes per day.  Social alcohol.  Occasional marijuana.  He is a Chiropractor.  He is separated  from his wife for greater than 9 to 12 months.  He has three children.   PAST MEDICAL HISTORY:  Presumed peptic ulcer disease.  Patient never sought  medical treatment.   PAST SURGICAL HISTORY:  Inguinal hernia repair bilaterally for bilateral  hydrocele as a toddler.   ALLERGIES:  NKDA.   CURRENT HOSPITAL MEDICATIONS:  Pepcid, Protonix, Carafate, Percocet, IV  morphine, Phenergan.   PHYSICAL EXAMINATION:  GENERAL:  Sedated male who is currently snoring and  difficult to arouse in endoscopy lab post procedure.  VITAL SIGNS:  Temp 97.7, BP 136/87, pulse 52 and  regular, respirations 20.  NEUROLOGIC:  Patient is sedated.  He does move extremities x 4 when  stimulated.  HEENT:  Head is normocephalic.  Sclerae are not injected.  NECK:  Supple.  No appreciable adenopathy.  CHEST:  Bilateral lung sounds are clear posteriorly.  Patient is on  __________ .  HEART:  Cardiac sounds are S1, S2.  No rubs, murmurs, thrills or gallops.  No JVD.  Carotids 2+ bilaterally.  No appreciable bruits.  ABDOMEN:  Soft, distended.  Does not appear to be tender but, again, patient  is sedated.  Bowel sounds are extremely active throughout all 4 quadrants of  the abdomen.  Patient is lying on his side, so difficult to appreciate  whether he has any hepatosplenomegaly.  EXTREMITIES:  Symmetrical in appearance without edema, cyanosis or clubbing.  Pulses are easily palpable at 2+ bilaterally and regular, femoral and pedal.   LABORATORY:  White count today is 4900, hemoglobin is down to 16.5.   This  was 18.5 on admission.  Platelets are 214,000.  Sodium 134, potassium 4, CO2  of 24, glucose 81, BUN 4, creatinine 1.  PT 13.4, INR 1, PTT 31.   DIAGNOSTICS:  A CT of the abdomen and pelvis was done yesterday.  This shows  a focal thickening of the splenic flexure of the colon.  There is a  questionable area of small bowel intussusception.  Target sign is noted on  the CT but no __________ , probably self-limiting.  Also noted were small  retroperitoneal nodes.   IMPRESSION:  1.  Left ascending colon bleeding mass, presumed malignancy.  2.  Descending and sigmoid colon diverticula.  3.  Volume depletion and polycythemia.  4.  Tobacco abuse, ongoing.  5.  Abdominal pain secondary to obstructing colon mass.   PLAN:  Again, on this endoscopy, this lesion has significant appearance  consistent with malignancy.  Pathology is pending.  Patient will probably  need to undergo left colectomy.  We have tentatively placed him on the  schedule for tomorrow in [address] with  Dr. Abbey Chatters as Careers adviser.  Due to patient being sedated, I spoke with the  mother, as noted, to obtain the history.  Also, discussed with her the  diagnosis, need for eventual surgery with expectation to proceed tomorrow  morning and expectations of the procedure including the small percentage  that patient may need a colostomy depending on the appearance of the bowel,  which may or may not include problems related to edema, infection or  bleeding.  Additional discussions about the surgery include risks and  benefits to be done per Dr. Abbey Chatters.  At this point, patient seems to be  somewhat volume depleted and polycythemic secondary to low volume.  Will go  ahead and continue IV fluids, D5 1/2 normal saline with potassium at 150 an  hour.  Repeat CBC in the morning.  Clear liquids only.  Patient may or may not benefit from a bowel prep and/or prophylactic oral antibiotic regimen  prior to bowel surgery, i.e., oral  erythromycin and neomycin x 3 doses each.  Agree with checking a CEA.   Thank you, once again, for this consultation.      Allison L. Rennis Harding, N.P.     ALE/MEDQ  D:  12/07/2005  T:  12/08/2005  Job:  [id number]

## 2011-02-18 NOTE — Op Note (Signed)
NAMECHARLETON, Richard [name]                 ACCOUNT NO.:  [id number]   MEDICAL RECORD NO.:  [id number]          PATIENT TYPE:  AMB   LOCATION:  DSC                          FACILITY:  [facility]   PHYSICIAN:  Adolph Pollack, M.D.DATE OF BIRTH:  02/27/1966   DATE OF PROCEDURE:  01/19/2006  DATE OF DISCHARGE:  01/19/2006                                 OPERATIVE REPORT   PREOPERATIVE DIAGNOSIS:  Colon cancer.   POSTOPERATIVE DIAGNOSIS:  Colon cancer.   PROCEDURE:  Insertion of single-lumen Port-A-Cath into the left subclavian  vein under fluoroscopic guidance.   SURGEON:  Adolph Pollack, M.D.   ANESTHESIA:  Local (1% lidocaine)with MAC.   INDICATIONS:  A 45 year old male has colon cancer and requires chemotherapy.  He needs long-term venous access and thus presents for Port-A-Cath  insertion.  We discussed the procedure and risks (including bleeding,  infection, death, pneumothorax, DVT, catheter malfunction) with him  preoperatively.   TECHNIQUE:  He was seen in the holding area, brought to the operating room,  placed supine on the operating room and given intravenous sedation.  A roll  was placed under the back.  The neck and upper chest were sterilely prepped  and draped.  Local anesthetic was infiltrated in the left infraclavicular  region.  An 18-gauge needle was used to cannulate the left subclavian vein  and a wire passed into the superior vena cava under fluoroscopic guidance.  Local anesthetic was then infiltrated inferior to the wire insertion site  into the chest wall superficially and deep.  The incision was made into the  chest wall through the skin and subcutaneous tissue and using electrocautery  a pocket created for the port.  An incision was then made around the wire  and a tunneling device passed from the superior through the inferior  incision.  The catheter was then threaded up from the inferior through the  superior incision.  A dilator and introducer complex was  then placed over  the wire and into the superior vena cava.  Dilator and wire were removed,  and the catheter was threaded through the introducer complex.  The catheter  was then pulled back until the tip was in the distal superior vena cava.  The catheter was then cut and placed onto the port.  The port was then  aspirated of blood and flushed easily.  The port was anchored to the chest  wall with interrupted 2-0 Vicryl sutures.   Fluoroscopy was used to confirm the position of the port and the position of  the tip of the catheter.  I then closed subcutaneous tissue over the port  with a running 2-0 Vicryl suture.  The skin incisions were closed with 4-0  Monocryl subcuticular stitches.  I recannulated the port and injected  concentrated heparin solution into the port.  I then placed Steri-Strips and  sterile dressings on the wound.   He tolerated the procedure without apparent complications.  He was taken to  recovery in satisfactory condition where a portal chest x-ray is pending.  I  will give him a prescription for  Coumadin due to take 1 mg a day, and I have  explained this to him preoperatively.  Also Vicodin for pain.      Adolph Pollack, M.D.  Electronically Signed     TJR/MEDQ  D:  01/19/2006  T:  01/19/2006  Job:  [id number]

## 2012-01-09 ENCOUNTER — Emergency Department (HOSPITAL_COMMUNITY)
Admission: EM | Admit: 2012-01-09 | Discharge: 2012-01-09 | Disposition: A | Discharge status: Home / Self Care | Payer: Self-pay | Attending: Emergency Medicine | Admitting: Emergency Medicine

## 2012-01-09 ENCOUNTER — Encounter (HOSPITAL_COMMUNITY): Payer: Self-pay

## 2012-01-09 DIAGNOSIS — H539 Unspecified visual disturbance: Secondary | ICD-10-CM | POA: Insufficient documentation

## 2012-01-09 DIAGNOSIS — S0590XA Unspecified injury of unspecified eye and orbit, initial encounter: Secondary | ICD-10-CM

## 2012-01-09 DIAGNOSIS — H5710 Ocular pain, unspecified eye: Secondary | ICD-10-CM | POA: Insufficient documentation

## 2012-01-09 DIAGNOSIS — H53149 Visual discomfort, unspecified: Secondary | ICD-10-CM | POA: Insufficient documentation

## 2012-01-09 DIAGNOSIS — S0510XA Contusion of eyeball and orbital tissues, unspecified eye, initial encounter: Secondary | ICD-10-CM | POA: Insufficient documentation

## 2012-01-09 DIAGNOSIS — IMO0002 Reserved for concepts with insufficient information to code with codable children: Secondary | ICD-10-CM | POA: Insufficient documentation

## 2012-01-09 DIAGNOSIS — F17200 Nicotine dependence, unspecified, uncomplicated: Secondary | ICD-10-CM | POA: Insufficient documentation

## 2012-01-09 HISTORY — DX: Gastro-esophageal reflux disease without esophagitis: K21.9

## 2012-01-09 HISTORY — DX: Malignant neoplasm of colon, unspecified: C18.9

## 2012-01-09 MED ORDER — POLYMYXIN B-TRIMETHOPRIM 10000-0.1 UNIT/ML-% OP SOLN
2.0000 [drp] | OPHTHALMIC | Status: DC
  Administered 2012-01-09: 2 [drp] via OPHTHALMIC
  Filled 2012-01-09: qty 10

## 2012-01-09 MED ORDER — FLUORESCEIN SODIUM 1 MG OP STRP
1.0000 | ORAL_STRIP | Freq: Once | OPHTHALMIC | Status: AC
  Administered 2012-01-09: 1 via OPHTHALMIC
  Filled 2012-01-09: qty 1

## 2012-01-09 MED ORDER — TETRACAINE HCL 0.5 % OP SOLN
2.0000 [drp] | Freq: Once | OPHTHALMIC | Status: AC
  Administered 2012-01-09: 2 [drp] via OPHTHALMIC
  Filled 2012-01-09: qty 2

## 2012-01-09 NOTE — ED Notes (Signed)
Pt states he was playing with son and his son poked him in the left eye with a toy on Saturday and since then his vision has become worse. States he has blurry vision and has always had problems with his left eye.

## 2012-01-09 NOTE — Discharge Instructions (Signed)
Eye Contusion Bruising around the eye is known as an eye contusion. Eye contusions may also be referred to as a "shiner" or "black eye." Eye contusions are typically caused by a direct hit (blunt trauma) to the face, eye, or forehead. They are common in many contact sports. The injury usually resolves without treatment in 3 to 10 days.  SYMPTOMS   Pain around the eye.   Swelling around the eye.   Purplish, "black and blue," discoloration around the eye, with gradual fading.   Tenderness over the cheekbone.   Eye discomfort when exposed to bright lights (photophobia).   Mild light-headedness, if a concussion occurs.  CAUSES   Direct person-to-person contact.   Contact with balls or other sports equipment.   Assault.   Contact with floors and walls.  RISK INCREASES WITH:   Contact or collision sports.   Not wearing protective gear.   Individuals with only one eye.   Partial blindness.  PREVENTION   Correct visual disturbances.   Wear protective eye gear.   Wear protective headgear.  TREATMENT  Treatment first involves ice and medicine to reduce pain and inflammation. It is important to watch for symptoms of a more serious injury, such as a concussion. If you develop severe pain, double vision, blurry vision, or blood in the space in front of your pupil, you should immediately seek medical attention. Protective equipment should always be worn, especially when returning to sports. Donot resume playing if vision has not returned to normal.  Document Released: 09/19/2005 Document Revised: 09/08/2011 Document Reviewed: 01/01/2009 [employer] Patient Information 2012 [employer]. 

## 2012-01-09 NOTE — ED Provider Notes (Signed)
History     CSN: [id number]  Arrival date & time 01/09/12  7829   First MD Initiated Contact with Patient 01/09/12 [phone]      Chief Complaint  Patient presents with  . Eye Pain    left    (Consider location/radiation/quality/duration/timing/severity/associated sxs/prior treatment) HPI Patient is a 46 yo male who presents today complaining of 10/10 left eye pain with some associated blurry vision since his son hit him in the eye with a toy 2 days ago.  He feels that this is worse with light.  The patient has an abnormal eye and vision at baseline in this eye and admits that he has not followed up with an opthomologist or gotten glasses though he was supposed to.  He describes foreign body sensation as well as an ache in his eye.  The patient is a vague historian despite detailed  questioning.  He denies nausea, complete loss of vision or visual field, or other concerning symptoms.  There are no other associated or modifying factors. Past Medical History  Diagnosis Date  . Colon cancer   . Acid reflux     Past Surgical History  Procedure Date  . Colon resection     No family history on file.  History  Substance Use Topics  . Smoking status: Current Everyday Smoker -- 1.0 packs/day  . Smokeless tobacco: Not on file  . Alcohol Use: Yes     occasionally      Review of Systems  Constitutional: Negative.   HENT: Negative.   Eyes: Positive for photophobia, pain, redness and visual disturbance.  Respiratory: Negative.   Cardiovascular: Negative.   Gastrointestinal: Negative.   Genitourinary: Negative.   Musculoskeletal: Negative.   Skin: Negative.   Neurological: Negative.   Hematological: Negative.   Psychiatric/Behavioral: Negative.   All other systems reviewed and are negative.    Allergies  Penicillins  Home Medications   Current Outpatient Rx  Name Route Sig Dispense Refill  . NEXIUM PO Oral Take 1 tablet by mouth 2 (two) times daily.      BP 135/85   Pulse 78  Temp(Src) 97.9 F (36.6 C) (Oral)  Resp 18  SpO2 99%  Physical Exam  Nursing note and vitals reviewed. GEN: Well-developed, well-nourished male in no distress HEENT: Atraumatic, normocephalic. Oropharynx clear without erythema EYES: PERRLA BL, no scleral icterus. Left conjunctiva is slightly injected. Left sclera is overgrown with flesh-colored triangular membrane extending from the medial canthus that has been present for years.  Slit-lamp without cell and flare, swing lamp testing negative, fluporescin remarkable for pinpoint corneal abrasion at 9 o'clock, no tonopen covers available.   NECK: Trachea midline, no meningismus CV: regular rate and rhythm.  PULM: No respiratory distress.   MSK: Patient moves all 4 extremities symmetrically, no deformity, edema, or injury noted Skin: No rashes petechiae, purpura, or jaundice Psych: no abnormality of mood   ED Course  Procedures (including critical care time)  Labs Reviewed - No data to display No results found.   1. Blunt eye trauma       MDM  Patient was evaluated for eye pain and photophobia following minor eye trauma.  There was no concern for ruptured globe and presentation was not consistent with traumatic iritis on my exam.  Patient was poor historian with prior pathology of the left eye and failure to follow-up.  Presentation was not consistent with glaucoma based on history.  Patient did have one pinpoint lesion concerning for corneal abrasion and  was started on antibiotic drops.  He was referred to on call optho for follow-up.        Cyndra Numbers, MD 01/10/12 2126

## 2012-04-16 ENCOUNTER — Emergency Department (HOSPITAL_BASED_OUTPATIENT_CLINIC_OR_DEPARTMENT_OTHER)
Admission: EM | Admit: 2012-04-16 | Discharge: 2012-04-16 | Disposition: A | Discharge status: Home / Self Care | Payer: Self-pay | Attending: Emergency Medicine | Admitting: Emergency Medicine

## 2012-04-16 ENCOUNTER — Encounter (HOSPITAL_BASED_OUTPATIENT_CLINIC_OR_DEPARTMENT_OTHER): Payer: Self-pay | Admitting: *Deleted

## 2012-04-16 DIAGNOSIS — Z880 Allergy status to penicillin: Secondary | ICD-10-CM | POA: Insufficient documentation

## 2012-04-16 DIAGNOSIS — Z85038 Personal history of other malignant neoplasm of large intestine: Secondary | ICD-10-CM | POA: Insufficient documentation

## 2012-04-16 DIAGNOSIS — K219 Gastro-esophageal reflux disease without esophagitis: Secondary | ICD-10-CM | POA: Insufficient documentation

## 2012-04-16 DIAGNOSIS — L509 Urticaria, unspecified: Secondary | ICD-10-CM | POA: Insufficient documentation

## 2012-04-16 DIAGNOSIS — F17200 Nicotine dependence, unspecified, uncomplicated: Secondary | ICD-10-CM | POA: Insufficient documentation

## 2012-04-16 MED ORDER — DEXAMETHASONE SODIUM PHOSPHATE 10 MG/ML IJ SOLN
10.0000 mg | Freq: Once | INTRAMUSCULAR | Status: AC
  Administered 2012-04-16: 10 mg via INTRAVENOUS
  Filled 2012-04-16: qty 1

## 2012-04-16 MED ORDER — FAMOTIDINE IN NACL 20-0.9 MG/50ML-% IV SOLN
20.0000 mg | Freq: Once | INTRAVENOUS | Status: AC
  Administered 2012-04-16: 20 mg via INTRAVENOUS
  Filled 2012-04-16: qty 50

## 2012-04-16 MED ORDER — SODIUM CHLORIDE 0.9 % IV SOLN
Freq: Once | INTRAVENOUS | Status: AC
  Administered 2012-04-16: 20 mL/h via INTRAVENOUS

## 2012-04-16 MED ORDER — DIPHENHYDRAMINE HCL 50 MG/ML IJ SOLN
50.0000 mg | Freq: Once | INTRAMUSCULAR | Status: AC
  Administered 2012-04-16: 50 mg via INTRAVENOUS
  Filled 2012-04-16: qty 1

## 2012-04-16 MED ORDER — DIPHENHYDRAMINE HCL 25 MG PO CAPS: 50.0000 mg | ORAL_CAPSULE | Freq: Four times a day (QID) | ORAL | Status: DC | PRN

## 2012-04-16 MED ORDER — FAMOTIDINE 20 MG PO TABS: ORAL_TABLET | ORAL | Status: DC

## 2012-04-16 NOTE — ED Provider Notes (Signed)
History     CSN: [id number]  Arrival date & time 04/16/12  0137   First MD Initiated Contact with Patient 04/16/12 [phone]      Chief Complaint  Patient presents with  . Hives     (Consider location/radiation/quality/duration/timing/severity/associated sxs/prior treatment) HPI This is a 46 year old black male with a three-day history of hives. He states they may have been triggered by a new soap. They were mild until yesterday evening when they became moderate to severe. The only thing is taken for it was two old prednisone tablets which he thinks were 10 mg apiece. He denies any respiratory difficulty, throat swelling, nausea, vomiting or diarrhea  Past Medical History  Diagnosis Date  . Colon cancer   . Acid reflux     Past Surgical History  Procedure Date  . Colon resection     History reviewed. No pertinent family history.  History  Substance Use Topics  . Smoking status: Current Everyday Smoker -- 1.0 packs/day  . Smokeless tobacco: Not on file  . Alcohol Use: Yes     occasionally      Review of Systems  All other systems reviewed and are negative.    Allergies  Penicillins  Home Medications   Current Outpatient Rx  Name Route Sig Dispense Refill  . NEXIUM PO Oral Take 1 tablet by mouth 2 (two) times daily.      BP 152/90  Pulse 75  Temp 97.8 F (36.6 C) (Oral)  Resp 16  SpO2 99%  Physical Exam General: Well-developed, well-nourished male in no acute distress; appearance consistent with age of record; scratching HENT: normocephalic, atraumatic; no pharyngeal or lingual edema Eyes: pupils equal round and reactive to light; extraocular muscles intact Neck: supple Heart: regular rate and rhythm Lungs: clear to auscultation bilaterally Abdomen: soft; nondistended Extremities: No deformity; full range of motion Neurologic: Awake, alert and oriented; motor function intact in all extremities and symmetric; no facial droop Skin: Sparsely distributed  clusters of of hives Psychiatric: Normal mood and affect    ED Course  Procedures (including critical care time)     MDM  3:34 AM Patient resting comfortably after IV Benadryl, Pepcid and dexamethasone. Hives nearly resolved.        Hanley Seamen, MD 04/16/12 [phone]

## 2012-04-16 NOTE — ED Notes (Signed)
Pt with hives since Friday became severe last Pm around 10 pt took "left over prednisone"

## 2013-02-03 ENCOUNTER — Emergency Department (HOSPITAL_COMMUNITY)
Admission: EM | Admit: 2013-02-03 | Discharge: 2013-02-04 | Disposition: A | Discharge status: Home / Self Care | Payer: BC Managed Care – PPO | Attending: Emergency Medicine | Admitting: Emergency Medicine

## 2013-02-03 ENCOUNTER — Encounter (HOSPITAL_COMMUNITY): Payer: Self-pay | Admitting: Emergency Medicine

## 2013-02-03 DIAGNOSIS — Z880 Allergy status to penicillin: Secondary | ICD-10-CM | POA: Insufficient documentation

## 2013-02-03 DIAGNOSIS — R112 Nausea with vomiting, unspecified: Secondary | ICD-10-CM | POA: Insufficient documentation

## 2013-02-03 DIAGNOSIS — Z85038 Personal history of other malignant neoplasm of large intestine: Secondary | ICD-10-CM | POA: Insufficient documentation

## 2013-02-03 DIAGNOSIS — Z8711 Personal history of peptic ulcer disease: Secondary | ICD-10-CM | POA: Insufficient documentation

## 2013-02-03 DIAGNOSIS — R21 Rash and other nonspecific skin eruption: Secondary | ICD-10-CM | POA: Insufficient documentation

## 2013-02-03 DIAGNOSIS — Z8719 Personal history of other diseases of the digestive system: Secondary | ICD-10-CM | POA: Insufficient documentation

## 2013-02-03 DIAGNOSIS — K2920 Alcoholic gastritis without bleeding: Secondary | ICD-10-CM

## 2013-02-03 DIAGNOSIS — M25562 Pain in left knee: Secondary | ICD-10-CM

## 2013-02-03 DIAGNOSIS — S0993XA Unspecified injury of face, initial encounter: Secondary | ICD-10-CM | POA: Insufficient documentation

## 2013-02-03 DIAGNOSIS — Y939 Activity, unspecified: Secondary | ICD-10-CM | POA: Insufficient documentation

## 2013-02-03 DIAGNOSIS — G8929 Other chronic pain: Secondary | ICD-10-CM | POA: Insufficient documentation

## 2013-02-03 DIAGNOSIS — Y92009 Unspecified place in unspecified non-institutional (private) residence as the place of occurrence of the external cause: Secondary | ICD-10-CM | POA: Insufficient documentation

## 2013-02-03 DIAGNOSIS — W19XXXA Unspecified fall, initial encounter: Secondary | ICD-10-CM | POA: Insufficient documentation

## 2013-02-03 DIAGNOSIS — R42 Dizziness and giddiness: Secondary | ICD-10-CM

## 2013-02-03 DIAGNOSIS — R040 Epistaxis: Secondary | ICD-10-CM

## 2013-02-03 DIAGNOSIS — F10929 Alcohol use, unspecified with intoxication, unspecified: Secondary | ICD-10-CM

## 2013-02-03 DIAGNOSIS — M25569 Pain in unspecified knee: Secondary | ICD-10-CM | POA: Insufficient documentation

## 2013-02-03 DIAGNOSIS — F1010 Alcohol abuse, uncomplicated: Secondary | ICD-10-CM | POA: Insufficient documentation

## 2013-02-03 DIAGNOSIS — F17200 Nicotine dependence, unspecified, uncomplicated: Secondary | ICD-10-CM | POA: Insufficient documentation

## 2013-02-03 HISTORY — DX: Peptic ulcer, site unspecified, unspecified as acute or chronic, without hemorrhage or perforation: K27.9

## 2013-02-03 LAB — COMPREHENSIVE METABOLIC PANEL
ALT: 40 U/L (ref 0–53)
Alkaline Phosphatase: 71 U/L (ref 39–117)
CO2: 21 mEq/L (ref 19–32)
GFR calc Af Amer: >90 mL/min (ref >90)
GFR calc non Af Amer: 86 mL/min — ABNORMAL LOW (ref >90)
Glucose, Bld: 139 mg/dL — ABNORMAL HIGH (ref 70–99)
Potassium: 3.5 mEq/L (ref 3.5–5.1)
Sodium: 138 mEq/L (ref 135–145)
Total Bilirubin: 0.2 mg/dL — ABNORMAL LOW (ref 0.3–1.2)

## 2013-02-03 LAB — CBC WITH DIFFERENTIAL/PLATELET
Hemoglobin: 13.6 g/dL (ref 13.0–17.0)
Lymphocytes Relative: 43 % (ref 12–46)
Lymphs Abs: 1.8 10*3/uL (ref 0.7–4.0)
MCV: 96.1 fL (ref 78.0–100.0)
Monocytes Relative: 13 % — ABNORMAL HIGH (ref 3–12)
Neutrophils Relative %: 36 % — ABNORMAL LOW (ref 43–77)
Platelets: 200 10*3/uL (ref 150–400)
RBC: 3.88 MIL/uL — ABNORMAL LOW (ref 4.22–5.81)
WBC: 4.2 10*3/uL (ref 4.0–10.5)

## 2013-02-03 NOTE — ED Notes (Addendum)
PT. REPORTS DIZZINESS WITH EPISTAXIS / FELL AT HOME THIS EVENING . NO LOC / NO BLEEDING AT ARRIVAL . STATES DRANK ETOH THIS EVENING . ALERT AND ORIENTED .

## 2013-02-03 NOTE — ED Notes (Signed)
The pt has multiple complaints.  Not eating loosing weight.  Nose bleed intermittently and a rash over his body for  3 days.  He has been taking benadryl for that.  No nose bleed at present

## 2013-02-04 ENCOUNTER — Encounter (HOSPITAL_COMMUNITY): Payer: Self-pay | Admitting: Radiology

## 2013-02-04 ENCOUNTER — Emergency Department (HOSPITAL_COMMUNITY): Payer: BC Managed Care – PPO

## 2013-02-04 MED ORDER — IOHEXOL 300 MG/ML  SOLN: 25.0000 mL | INTRAMUSCULAR | Status: DC

## 2013-02-04 MED ORDER — IOHEXOL 300 MG/ML  SOLN
100.0000 mL | Freq: Once | INTRAMUSCULAR | Status: AC | PRN
  Administered 2013-02-04: 100 mL via INTRAVENOUS

## 2013-02-04 MED ORDER — ONDANSETRON HCL 4 MG/2ML IJ SOLN
4.0000 mg | Freq: Once | INTRAMUSCULAR | Status: AC
  Administered 2013-02-04: 4 mg via INTRAVENOUS
  Filled 2013-02-04: qty 2

## 2013-02-04 MED ORDER — OXYMETAZOLINE HCL 0.05 % NA SOLN
1.0000 | Freq: Once | NASAL | Status: AC
  Administered 2013-02-04: 1 via NASAL
  Filled 2013-02-04: qty 15

## 2013-02-04 MED ORDER — HYDROXYZINE HCL 25 MG PO TABS
25.0000 mg | ORAL_TABLET | Freq: Once | ORAL | Status: AC
  Administered 2013-02-04: 25 mg via ORAL
  Filled 2013-02-04: qty 1

## 2013-02-04 MED ORDER — PROMETHAZINE HCL 25 MG PO TABS: 25.0000 mg | ORAL_TABLET | Freq: Four times a day (QID) | ORAL | Status: DC | PRN

## 2013-02-04 MED ORDER — IOHEXOL 300 MG/ML  SOLN: 50.0000 mL | Freq: Once | INTRAMUSCULAR | Status: DC | PRN

## 2013-02-04 NOTE — ED Provider Notes (Addendum)
History     CSN: [id number]  Arrival date & time 02/03/13  2248   First MD Initiated Contact with Patient 02/04/13 0026      Chief Complaint  Patient presents with  . Dizziness  . Epistaxis  . Fall    (Consider location/radiation/quality/duration/timing/severity/associated sxs/prior treatment) HPI Richard [name] is a 47 y.o. male history of colon cancer, acid reflux presents with multiple complaints. Patient is had morning nausea and vomiting, patient drinks alcohol daily but he does not think that this is a problem. Does not have any abdominal pain, chest pain, shortness of breath. He complains about chronic knee pain for which she wears a knee brace, patient also complains about an itchy rash which he has had in the past.  He has lost weight over the last few months  It is concerned about his appetite. Patient is also complaining about left-sided nosebleed today where he says he lost a lot of blood, currently is hemostatic. He's not taking any blood thinners. Denies any fevers or chills.   Past Medical History  Diagnosis Date  . Colon cancer   . Acid reflux   . PUD (peptic ulcer disease)   . Colon cancer     Past Surgical History  Procedure Laterality Date  . Colon resection      No family history on file.  History  Substance Use Topics  . Smoking status: Current Every Day Smoker -- 1.00 packs/day  . Smokeless tobacco: Not on file  . Alcohol Use: Yes     Comment: occasionally      Review of Systems At least 10pt or greater review of systems completed and are negative except where specified in the HPI.  Allergies  Penicillins  Home Medications  No current outpatient prescriptions on file.  BP 134/84  Pulse 81  Temp(Src) 97.8 F (36.6 C) (Oral)  Resp 14  SpO2 100%  Physical Exam  Nursing notes reviewed.  Electronic medical record reviewed. VITAL SIGNS:   Filed Vitals:   02/04/13 0135 02/04/13 0136 02/04/13 0138 02/04/13 0303  BP: 111/65 108/67 127/74  136/88  Pulse: 77 62 80 77  Temp:      TempSrc:      Resp:    18  SpO2:    96%   CONSTITUTIONAL: Awake, oriented, appears non-toxic, smells of alcohol and appears mildly intoxicated HENT: Atraumatic, normocephalic, oral mucosa pink and moist, airway patent. Nares patent without drainage. External ears normal. EYES: Conjunctiva clear, EOMI, PERRLA NECK: Trachea midline, non-tender, supple CARDIOVASCULAR: Normal heart rate, Normal rhythm, No murmurs, rubs, gallops PULMONARY/CHEST: Clear to auscultation, no rhonchi, wheezes, or rales. Symmetrical breath sounds. Non-tender. ABDOMINAL: Non-distended, soft, non-tender - no rebound or guarding.  BS normal. NEUROLOGIC: Non-focal, moving all four extremities, no gross sensory or motor deficits. EXTREMITIES: No clubbing, cyanosis, or edema SKIN: Warm, Dry, No erythema, No rash  ED Course  Procedures (including critical care time)  Date: 02/04/2013  Rate: 69  Rhythm: normal sinus rhythm  QRS Axis: normal  Intervals: normal  ST/T Wave abnormalities: normal  Conduction Disutrbances: none  Narrative Interpretation: Normal sinus rhythm no prior     Labs Reviewed  CBC WITH DIFFERENTIAL - Abnormal; Notable for the following:    RBC 3.88 (*)    HCT 37.3 (*)    MCH 35.1 (*)    MCHC 36.5 (*)    Neutrophils Relative 36 (*)    Neutro Abs 1.5 (*)    Monocytes Relative 13 (*)  Eosinophils Relative 6 (*)    Basophils Relative 2 (*)    All other components within normal limits  COMPREHENSIVE METABOLIC PANEL - Abnormal; Notable for the following:    Glucose, Bld 139 (*)    AST 94 (*)    Total Bilirubin 0.2 (*)    GFR calc non Af Amer 86 (*)    All other components within normal limits   Ct Abdomen Pelvis W Contrast  02/04/2013  *RADIOLOGY REPORT*  Clinical Data: 47 year old male with weight loss nausea and vomiting.  History of colon cancer.  CT ABDOMEN AND PELVIS WITH CONTRAST  Technique:  Multidetector CT imaging of the abdomen and pelvis  was performed following the standard protocol during bolus administration of intravenous contrast.  Contrast: OMNIPAQUE IOHEXOL 300 MG/ML  SOLN  Comparison: 01/13/2009 and earlier.  Findings: Stable lung bases with minor atelectasis.  No pericardial or pleural effusion.  No acute or suspicious osseous lesion identified.  No pelvic free fluid.  Decompressed distal colon.  Bladder moderately distended but otherwise unremarkable.  In the upper abdomen the large bowel is distended with gas and low density stool measuring up to 6 cm diameter.  The hepatic flexure and right colon has a more normal appearance. Normal appendix. Oral contrast has not yet reached the terminal ileum.  No dilated small bowel loops.  The stomach is distended with contrast and food debris.  Duodenum within normal limits.  Background decreased density in the liver is stable.  Subtle low density area near the falciform ligament is stable (series 2 image 13).  A small low density area adjacent to the gallbladder fossa also is stable.  Negative gallbladder.  Negative spleen, pancreas and adrenal glands.  Portal venous system is patent.  Major arterial structures in the abdomen and pelvis are patent with atherosclerosis.  No abdominal free fluid.  No lymphadenopathy identified.  Kidneys are stable and are normal except for a 2 mm nonobstructing right lower pole calculus.  IMPRESSION: 1.  Overall stable postoperative appearance of the abdomen and pelvis.  Evidence of partial colectomy.  The mid colon is more distended with gas and low density stool, but no evidence of bowel obstruction. 2.  Stable evidence of hepatic steatosis with stable heterogeneity along the falciform ligament and gallbladder fossa since 2010.  No metastatic disease identified in the abdomen or pelvis.   Original Report Authenticated By: Erskine Speed, M.D.      1. Alcohol intoxication   2. Dizzy   3. Epistaxis   4. Nausea and vomiting   5. Alcoholic gastritis   6.  Left knee pain       MDM  Patient with multiple complaints including epistaxis, dizziness, left knee pain and concern for colon cancer recurrence - labs are unremarkable with exception of alcohol level of 172, patient is having morning nausea and vomiting, but this is likely secondary to chronic alcohol use as he does drink every day. 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  Patient's CT of the abdomen is unremarkable. No evidence for metastasis or recurrence of colon cancer. Outpatient followup with [facility]       Jones Skene, MD 02/04/13 4098  Jones Skene, MD 02/04/13 1191

## 2013-02-04 NOTE — ED Notes (Signed)
Pt sleeping waiting on results

## 2013-02-04 NOTE — ED Notes (Signed)
The pt  Has finished his contrast.  C/o itching his mother at bedside keeps speaking for him.  Pt has not opened his eyes.

## 2013-02-04 NOTE — ED Notes (Signed)
Pt drinking oral contrast.

## 2013-02-04 NOTE — ED Notes (Signed)
Med given for itching to c-t now

## 2013-02-04 NOTE — ED Notes (Signed)
c-t contacted  About oral contrast xonsumed

## 2013-03-09 ENCOUNTER — Encounter (HOSPITAL_BASED_OUTPATIENT_CLINIC_OR_DEPARTMENT_OTHER): Payer: Self-pay

## 2013-03-09 ENCOUNTER — Emergency Department (HOSPITAL_BASED_OUTPATIENT_CLINIC_OR_DEPARTMENT_OTHER): Payer: BC Managed Care – PPO

## 2013-03-09 ENCOUNTER — Emergency Department (HOSPITAL_BASED_OUTPATIENT_CLINIC_OR_DEPARTMENT_OTHER)
Admission: EM | Admit: 2013-03-09 | Discharge: 2013-03-09 | Disposition: A | Discharge status: Home / Self Care | Payer: BC Managed Care – PPO | Attending: Emergency Medicine | Admitting: Emergency Medicine

## 2013-03-09 DIAGNOSIS — S62319A Displaced fracture of base of unspecified metacarpal bone, initial encounter for closed fracture: Secondary | ICD-10-CM | POA: Insufficient documentation

## 2013-03-09 DIAGNOSIS — S99919A Unspecified injury of unspecified ankle, initial encounter: Secondary | ICD-10-CM | POA: Insufficient documentation

## 2013-03-09 DIAGNOSIS — Y990 Civilian activity done for income or pay: Secondary | ICD-10-CM | POA: Insufficient documentation

## 2013-03-09 DIAGNOSIS — Z85038 Personal history of other malignant neoplasm of large intestine: Secondary | ICD-10-CM | POA: Insufficient documentation

## 2013-03-09 DIAGNOSIS — Y9389 Activity, other specified: Secondary | ICD-10-CM | POA: Insufficient documentation

## 2013-03-09 DIAGNOSIS — S8992XA Unspecified injury of left lower leg, initial encounter: Secondary | ICD-10-CM

## 2013-03-09 DIAGNOSIS — Z8711 Personal history of peptic ulcer disease: Secondary | ICD-10-CM | POA: Insufficient documentation

## 2013-03-09 DIAGNOSIS — K219 Gastro-esophageal reflux disease without esophagitis: Secondary | ICD-10-CM | POA: Insufficient documentation

## 2013-03-09 DIAGNOSIS — Z880 Allergy status to penicillin: Secondary | ICD-10-CM | POA: Insufficient documentation

## 2013-03-09 DIAGNOSIS — F17200 Nicotine dependence, unspecified, uncomplicated: Secondary | ICD-10-CM | POA: Insufficient documentation

## 2013-03-09 DIAGNOSIS — IMO0002 Reserved for concepts with insufficient information to code with codable children: Secondary | ICD-10-CM | POA: Insufficient documentation

## 2013-03-09 DIAGNOSIS — Z79899 Other long term (current) drug therapy: Secondary | ICD-10-CM | POA: Insufficient documentation

## 2013-03-09 DIAGNOSIS — S6990XA Unspecified injury of unspecified wrist, hand and finger(s), initial encounter: Secondary | ICD-10-CM | POA: Insufficient documentation

## 2013-03-09 DIAGNOSIS — Y9289 Other specified places as the place of occurrence of the external cause: Secondary | ICD-10-CM | POA: Insufficient documentation

## 2013-03-09 DIAGNOSIS — S8990XA Unspecified injury of unspecified lower leg, initial encounter: Secondary | ICD-10-CM | POA: Insufficient documentation

## 2013-03-09 MED ORDER — HYDROCODONE-ACETAMINOPHEN 5-325 MG PO TABS
1.0000 | ORAL_TABLET | Freq: Four times a day (QID) | ORAL | Status: DC | PRN
Start: 2013-03-09 — End: 2014-06-27

## 2013-03-09 NOTE — ED Provider Notes (Signed)
History     CSN: [id number]  Arrival date & time 03/09/13  1218   First MD Initiated Contact with Patient 03/09/13 1251      Chief Complaint  Patient presents with  . Hand Injury  . Knee Pain    (Consider location/radiation/quality/duration/timing/severity/associated sxs/prior treatment) Patient is a 47 y.o. male presenting with hand injury and knee pain. The history is provided by the patient.  Hand Injury Associated symptoms: no back pain, no fever and no neck pain   Knee Pain Associated symptoms: no back pain, no fever and no neck pain    patient with injury to right hand yesterday at work hit it on a hard solid object. Swelling to that area and pain. Patient also caught his left leg under a ladder in complaining of left knee pain. The pain is 10 out of 10. Made worse by movement of either hand or the knee. No other injuries no loss of consciousness denies neck pain head pain chest pain abdominal pain or other extremity pain.  Past Medical History  Diagnosis Date  . Colon cancer   . Acid reflux   . PUD (peptic ulcer disease)   . Colon cancer     Past Surgical History  Procedure Laterality Date  . Colon resection      History reviewed. No pertinent family history.  History  Substance Use Topics  . Smoking status: Current Every Day Smoker -- 1.00 packs/day  . Smokeless tobacco: Not on file  . Alcohol Use: Yes     Comment: occasionally      Review of Systems  Constitutional: Negative for fever.  HENT: Negative for neck pain.   Eyes: Negative for redness.  Respiratory: Negative for shortness of breath.   Cardiovascular: Negative for chest pain.  Gastrointestinal: Negative for abdominal pain.  Musculoskeletal: Negative for back pain.  Skin: Negative for rash.  Neurological: Negative for weakness, numbness and headaches.  Hematological: Does not bruise/bleed easily.  Psychiatric/Behavioral: Negative for confusion.    Allergies  Penicillins  Home  Medications   Current Outpatient Rx  Name  Route  Sig  Dispense  Refill  . esomeprazole (NEXIUM) 20 MG capsule   Oral   Take 20 mg by mouth daily before breakfast.         . naproxen (NAPROSYN) 250 MG tablet   Oral   Take 250 mg by mouth 2 (two) times daily with a meal.         . HYDROcodone-acetaminophen (NORCO/VICODIN) 5-325 MG per tablet   Oral   Take 1-2 tablets by mouth every 6 (six) hours as needed for pain.   14 tablet   0   . promethazine (PHENERGAN) 25 MG tablet   Oral   Take 1 tablet (25 mg total) by mouth every 6 (six) hours as needed for nausea.   30 tablet   0     BP 151/72  Pulse 100  Temp(Src) 98.4 F (36.9 C) (Oral)  Resp 20  Ht 6' (1.829 m)  Wt 155 lb (70.308 kg)  BMI 21.02 kg/m2  SpO2 99%  Physical Exam  Constitutional: He is oriented to person, place, and time. He appears well-developed and well-nourished. No distress.  HENT:  Head: Normocephalic and atraumatic.  Eyes: Conjunctivae and EOM are normal. Pupils are equal, round, and reactive to light.  Neck: Normal range of motion. Neck supple.  Cardiovascular: Normal rate.   Pulmonary/Chest: Effort normal and breath sounds normal.  Abdominal: Soft. Bowel sounds are normal.  There is no tenderness.  Musculoskeletal: He exhibits tenderness. He exhibits no edema.  Right hand was significant swelling of the dorsum of the hand. No obvious deformity neurocirculatory is intact distally cap refill 2 seconds. Sensation intact. Suspect of fractures of the metacarpals. Left knee without effusion the patellas not dislocated no joint line tenderness. Limited range of motion due to pain. Neurovascular intact distally.  Neurological: He is alert and oriented to person, place, and time. No cranial nerve deficit. He exhibits normal muscle tone. Coordination normal.  Skin: Skin is warm.    ED Course  Procedures (including critical care time)  Labs Reviewed - No data to display Dg Knee Complete 4 Views  Left  03/09/2013   *RADIOLOGY REPORT*  Clinical Data: History of fall with injury to left knee.  LEFT KNEE - COMPLETE 4+ VIEW  Comparison: No priors.  Findings: Four views of the left knee demonstrate some mild irregularity of the articular surface of the medial femoral condyle.  Adjacent to this projecting over the joint space there is a tiny ossific density, concerning for loose body in the joint space.  There are also ossific densities projecting immediately superior to the patella, likely within the suprapatellar bursa, and lateral to the distal femur, likely a focus of heterotopic ossification.  IMPRESSION: 1.  Irregularity of the articular surface of the medial femoral condyle may suggest an osteochondral injury.  Probable loose body within the joint space immediately lateral to this.  If there is clinical concern for internal joint derangement, these findings can be better characterized with follow-up MRI of the knee. 2.  Probable loose body within the suprapatellar bursa. 3.  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   Original Report Authenticated By: Trudie Reed, M.D.   Dg Hand Complete Right  03/09/2013   *RADIOLOGY REPORT*  Clinical Data: History of fall complaining of right-sided hand pain.  RIGHT HAND - COMPLETE 3+ VIEW  Comparison: No priors.  Findings: There is an acute minimally displaced and mildly angulated transverse fracture through the distal third of the fourth metacarpal diaphysis with approximately 20 degrees of volar angulation.  There also appears to be an acute nondisplaced fracture through the distal aspect of the second metacarpal.  The distal aspect of the second metacarpal has an irregular shape and appearance suggestive of an old healed fracture through this region as well.  There is also post-traumatic remodelling of the distal first metacarpal, and the base of the fifth metacarpal, also likely related to prior healed fractures.   IMPRESSION: 1.  Acute fractures of the third and fourth metacarpals, as above. 2.  Old healed fractures of the distal second and third metacarpals, and the base of the fifth metacarpal.   Original Report Authenticated By: Trudie Reed, M.D.     1. Knee injury, left, initial encounter   2. Fracture of metacarpal, initial encounter       MDM  Patient with injury at work yesterday also fell off a ladder. Left knee without an acute effusion some of the findings on the x-ray of may be old. Right hand clearly has acute injuries to involve metacarpal fracture of the third and fourth fingers. A splint applied referral to hand surgery for the hand and general orthopedics for the knee.        Shelda Jakes, MD 03/09/13 [phone]

## 2013-03-09 NOTE — ED Notes (Signed)
Pt states that he was working yesterday and hit his hand on a solid object, swelling to the R hand posterior aspect.  Pt also caught his L leg under a ladder, c/o L knee pain also.

## 2013-10-23 ENCOUNTER — Telehealth: Payer: Self-pay | Admitting: Hematology and Oncology

## 2013-10-23 ENCOUNTER — Other Ambulatory Visit: Payer: Self-pay | Admitting: Internal Medicine

## 2013-10-23 ENCOUNTER — Ambulatory Visit
Admission: RE | Admit: 2013-10-23 | Discharge: 2013-10-23 | Disposition: A | Discharge status: Home / Self Care | Payer: BC Managed Care – PPO | Source: Ambulatory Visit | Attending: Internal Medicine | Admitting: Internal Medicine

## 2013-10-23 DIAGNOSIS — M25562 Pain in left knee: Secondary | ICD-10-CM

## 2013-10-23 DIAGNOSIS — R0789 Other chest pain: Secondary | ICD-10-CM

## 2013-10-23 NOTE — Telephone Encounter (Signed)
Faxed pt medical records to [facility] °

## 2014-06-27 ENCOUNTER — Encounter (HOSPITAL_COMMUNITY): Payer: Self-pay | Admitting: Emergency Medicine

## 2014-06-27 ENCOUNTER — Emergency Department (HOSPITAL_COMMUNITY)
Admission: EM | Admit: 2014-06-27 | Discharge: 2014-06-27 | Disposition: A | Discharge status: Home / Self Care | Payer: BC Managed Care – PPO | Attending: Emergency Medicine | Admitting: Emergency Medicine

## 2014-06-27 DIAGNOSIS — X500XXA Overexertion from strenuous movement or load, initial encounter: Secondary | ICD-10-CM | POA: Insufficient documentation

## 2014-06-27 DIAGNOSIS — F17200 Nicotine dependence, unspecified, uncomplicated: Secondary | ICD-10-CM | POA: Insufficient documentation

## 2014-06-27 DIAGNOSIS — Y9301 Activity, walking, marching and hiking: Secondary | ICD-10-CM | POA: Insufficient documentation

## 2014-06-27 DIAGNOSIS — G8929 Other chronic pain: Secondary | ICD-10-CM | POA: Insufficient documentation

## 2014-06-27 DIAGNOSIS — M25562 Pain in left knee: Secondary | ICD-10-CM

## 2014-06-27 DIAGNOSIS — S99929A Unspecified injury of unspecified foot, initial encounter: Principal | ICD-10-CM

## 2014-06-27 DIAGNOSIS — S79919A Unspecified injury of unspecified hip, initial encounter: Secondary | ICD-10-CM | POA: Insufficient documentation

## 2014-06-27 DIAGNOSIS — S99919A Unspecified injury of unspecified ankle, initial encounter: Principal | ICD-10-CM

## 2014-06-27 DIAGNOSIS — S8990XA Unspecified injury of unspecified lower leg, initial encounter: Secondary | ICD-10-CM | POA: Insufficient documentation

## 2014-06-27 DIAGNOSIS — Z880 Allergy status to penicillin: Secondary | ICD-10-CM | POA: Insufficient documentation

## 2014-06-27 DIAGNOSIS — Y9289 Other specified places as the place of occurrence of the external cause: Secondary | ICD-10-CM | POA: Insufficient documentation

## 2014-06-27 DIAGNOSIS — Z85038 Personal history of other malignant neoplasm of large intestine: Secondary | ICD-10-CM | POA: Insufficient documentation

## 2014-06-27 DIAGNOSIS — K219 Gastro-esophageal reflux disease without esophagitis: Secondary | ICD-10-CM | POA: Insufficient documentation

## 2014-06-27 DIAGNOSIS — S79929A Unspecified injury of unspecified thigh, initial encounter: Secondary | ICD-10-CM

## 2014-06-27 DIAGNOSIS — Z8711 Personal history of peptic ulcer disease: Secondary | ICD-10-CM | POA: Insufficient documentation

## 2014-06-27 DIAGNOSIS — Z79899 Other long term (current) drug therapy: Secondary | ICD-10-CM | POA: Insufficient documentation

## 2014-06-27 MED ORDER — HYDROCODONE-ACETAMINOPHEN 5-325 MG PO TABS
1.0000 | ORAL_TABLET | Freq: Once | ORAL | Status: DC
  Filled 2014-06-27: qty 1

## 2014-06-27 MED ORDER — MELOXICAM 7.5 MG PO TABS: 7.5000 mg | ORAL_TABLET | Freq: Every day | ORAL | Status: DC

## 2014-06-27 MED ORDER — OXYCODONE-ACETAMINOPHEN 5-325 MG PO TABS: 1.0000 | ORAL_TABLET | ORAL | Status: DC | PRN

## 2014-06-27 MED ORDER — OXYCODONE-ACETAMINOPHEN 5-325 MG PO TABS
1.0000 | ORAL_TABLET | Freq: Once | ORAL | Status: AC
  Administered 2014-06-27: 1 via ORAL
  Filled 2014-06-27: qty 1

## 2014-06-27 MED ORDER — HYDROCODONE-ACETAMINOPHEN 5-325 MG PO TABS: 1.0000 | ORAL_TABLET | ORAL | Status: DC | PRN

## 2014-06-27 NOTE — ED Notes (Signed)
Pt reports falling two days ago and still having left knee pain. Ambulating with crutches on arrival.

## 2014-06-27 NOTE — ED Provider Notes (Signed)
Medical screening examination/treatment/procedure(s) were performed by non-physician practitioner and as supervising physician I was immediately available for consultation/collaboration.    Johnna Acosta, MD 06/27/14 1901

## 2014-06-27 NOTE — ED Provider Notes (Signed)
CSN: [id number]     Arrival date & time 06/27/14  5993 History  This chart was scribed for non-physician practitioner Clayton Bibles, PA-C working with Johnna Acosta, MD by Ludger Nutting, ED Scribe. This patient was seen in room TR07C/TR07C and the patient's care was started at 9:54 AM.    Chief Complaint  Patient presents with  . Fall  . Knee Pain   The history is provided by the patient. No language interpreter was used.    HPI Comments: Richard [name] is a 48 y.o. male who presents to the Emergency Department complaining of 2 days of constant, gradually worsening left knee pain with mild left hip pain that began after the left knee buckled. Patient states he was walking down stairs when the injury occurred but he denies any trauma. Patient states he has chronic left knee pain and describes this knee to be "flimsy". He describes his current pain as popping. He states the pain is worse with sitting down and bearing weight. He reports using crutches to help him ambulate. Patient states he was seen by ortho in April and planned to have surgery. He states he is not able to squat or run at baseline. He denies fever, weakness, numbness, back pain.   Past Medical History  Diagnosis Date  . Colon cancer   . Acid reflux   . PUD (peptic ulcer disease)   . Colon cancer    Past Surgical History  Procedure Laterality Date  . Colon resection     History reviewed. No pertinent family history. History  Substance Use Topics  . Smoking status: Current Every Day Smoker -- 1.00 packs/day  . Smokeless tobacco: Not on file  . Alcohol Use: Yes     Comment: occasionally    Review of Systems  Constitutional: Negative for fever.  Musculoskeletal: Positive for arthralgias.  Skin: Negative for wound.  Neurological: Negative for weakness and numbness.      Allergies  Penicillins  Home Medications   Prior to Admission medications   Medication Sig Start Date End Date Taking? Authorizing Provider   esomeprazole (NEXIUM) 20 MG capsule Take 20 mg by mouth daily before breakfast.    Historical Provider, MD  HYDROcodone-acetaminophen (NORCO/VICODIN) 5-325 MG per tablet Take 1-2 tablets by mouth every 6 (six) hours as needed for pain. 03/09/13   Fredia Sorrow, MD  naproxen (NAPROSYN) 250 MG tablet Take 250 mg by mouth 2 (two) times daily with a meal.    Historical Provider, MD  promethazine (PHENERGAN) 25 MG tablet Take 1 tablet (25 mg total) by mouth every 6 (six) hours as needed for nausea. 02/04/13   John-Adam Bonk, MD   BP 153/77  Pulse 88  Temp(Src) 98.3 F (36.8 C) (Oral)  Resp 20  SpO2 100% Physical Exam  Nursing note and vitals reviewed. Constitutional: He appears well-developed and well-nourished. No distress.  HENT:  Head: Normocephalic and atraumatic.  Neck: Neck supple.  Cardiovascular: Normal rate.   Pulmonary/Chest: Effort normal.  Musculoskeletal: Normal range of motion. He exhibits no edema and no tenderness.  Left knee: Full active ROM. No erythema, edema, or warmth. No tenderness. Joint is stable without pain or laxity when stressed in any direction. Distal sensation and pulses intact.  Left hip: Non tender. No erythema, edema, or warmth.  Lumbar spine: Non tender. No crepitus or step offs.   Neurological: He is alert.  Skin: He is not diaphoretic.    ED Course  Procedures (including critical care time)  DIAGNOSTIC STUDIES: Oxygen Saturation is 100% on RA, normal by my interpretation.    COORDINATION OF CARE: 10:03 AM Will discharge home with pain medication and referral to ortho. Discussed treatment plan with pt at bedside and pt agreed to plan.   Labs Review Labs Reviewed - No data to display  Imaging Review No results found.   EKG Interpretation None      MDM   Final diagnoses:  Knee pain, left    Afebrile, nontoxic patient with exacerbation of chronic left knee pain.  Knee is stable.  No e/o infection, gout. Did not fall onto the knee. No  bony tenderness. No xray indicated.   D/C home with new knee sleeve (his is now too big for him), crutches, mobic, #10 percocet, orthopedic follow up.  Discussed result, findings, treatment, and follow up  with patient.  Pt given return precautions.  Pt verbalizes understanding and agrees with plan.       I personally performed the services described in this documentation, which was scribed in my presence. The recorded information has been reviewed and is accurate.   Clayton Bibles, PA-C 06/27/14 1221

## 2014-06-27 NOTE — Discharge Instructions (Signed)
Read the information below.  Use the prescribed medication as directed.  Please discuss all new medications with your pharmacist.  Do not take additional tylenol while taking the prescribed pain medication to avoid overdose.  You may return to the Emergency Department at any time for worsening condition or any new symptoms that concern you.  If you develop uncontrolled pain, weakness or numbness of the extremity, severe discoloration of the skin, or you are unable to move your knee or walk, return to the ER for a recheck.      Knee Pain The knee is the complex joint between your thigh and your lower leg. It is made up of bones, tendons, ligaments, and cartilage. The bones that make up the knee are:  The femur in the thigh.  The tibia and fibula in the lower leg.  The patella or kneecap riding in the groove on the lower femur. CAUSES  Knee pain is a common complaint with many causes. A few of these causes are:  Injury, such as:  A ruptured ligament or tendon injury.  Torn cartilage.  Medical conditions, such as:  Gout  Arthritis  Infections  Overuse, over training, or overdoing a physical activity. Knee pain can be minor or severe. Knee pain can accompany debilitating injury. Minor knee problems often respond well to self-care measures or get well on their own. More serious injuries may need medical intervention or even surgery. SYMPTOMS The knee is complex. Symptoms of knee problems can vary widely. Some of the problems are:  Pain with movement and weight bearing.  Swelling and tenderness.  Buckling of the knee.  Inability to straighten or extend your knee.  Your knee locks and you cannot straighten it.  Warmth and redness with pain and fever.  Deformity or dislocation of the kneecap. DIAGNOSIS  Determining what is wrong may be very straight forward such as when there is an injury. It can also be challenging because of the complexity of the knee. Tests to make a  diagnosis may include:  Your caregiver taking a history and doing a physical exam.  Routine X-rays can be used to rule out other problems. X-rays will not reveal a cartilage tear. Some injuries of the knee can be diagnosed by:  Arthroscopy a surgical technique by which a small video camera is inserted through tiny incisions on the sides of the knee. This procedure is used to examine and repair internal knee joint problems. Tiny instruments can be used during arthroscopy to repair the torn knee cartilage (meniscus).  Arthrography is a radiology technique. A contrast liquid is directly injected into the knee joint. Internal structures of the knee joint then become visible on X-ray film.  An MRI scan is a non X-ray radiology procedure in which magnetic fields and a computer produce two- or three-dimensional images of the inside of the knee. Cartilage tears are often visible using an MRI scanner. MRI scans have largely replaced arthrography in diagnosing cartilage tears of the knee.  Blood work.  Examination of the fluid that helps to lubricate the knee joint (synovial fluid). This is done by taking a sample out using a needle and a syringe. TREATMENT The treatment of knee problems depends on the cause. Some of these treatments are:  Depending on the injury, proper casting, splinting, surgery, or physical therapy care will be needed.  Give yourself adequate recovery time. Do not overuse your joints. If you begin to get sore during workout routines, back off. Slow down or do  fewer repetitions.  For repetitive activities such as cycling or running, maintain your strength and nutrition.  Alternate muscle groups. For example, if you are a weight lifter, work the upper body on one day and the lower body the next.  Either tight or weak muscles do not give the proper support for your knee. Tight or weak muscles do not absorb the stress placed on the knee joint. Keep the muscles surrounding the knee  strong.  Take care of mechanical problems.  If you have flat feet, orthotics or special shoes may help. See your caregiver if you need help.  Arch supports, sometimes with wedges on the inner or outer aspect of the heel, can help. These can shift pressure away from the side of the knee most bothered by osteoarthritis.  A brace called an "unloader" brace also may be used to help ease the pressure on the most arthritic side of the knee.  If your caregiver has prescribed crutches, braces, wraps or ice, use as directed. The acronym for this is PRICE. This means protection, rest, ice, compression, and elevation.  Nonsteroidal anti-inflammatory drugs (NSAIDs), can help relieve pain. But if taken immediately after an injury, they may actually increase swelling. Take NSAIDs with food in your stomach. Stop them if you develop stomach problems. Do not take these if you have a history of ulcers, stomach pain, or bleeding from the bowel. Do not take without your caregiver's approval if you have problems with fluid retention, heart failure, or kidney problems.  For ongoing knee problems, physical therapy may be helpful.  Glucosamine and chondroitin are over-the-counter dietary supplements. Both may help relieve the pain of osteoarthritis in the knee. These medicines are different from the usual anti-inflammatory drugs. Glucosamine may decrease the rate of cartilage destruction.  Injections of a corticosteroid drug into your knee joint may help reduce the symptoms of an arthritis flare-up. They may provide pain relief that lasts a few months. You may have to wait a few months between injections. The injections do have a small increased risk of infection, water retention, and elevated blood sugar levels.  Hyaluronic acid injected into damaged joints may ease pain and provide lubrication. These injections may work by reducing inflammation. A series of shots may give relief for as long as 6 months.  Topical  painkillers. Applying certain ointments to your skin may help relieve the pain and stiffness of osteoarthritis. Ask your pharmacist for suggestions. Many over the-counter products are approved for temporary relief of arthritis pain.  In some countries, doctors often prescribe topical NSAIDs for relief of chronic conditions such as arthritis and tendinitis. A review of treatment with NSAID creams found that they worked as well as oral medications but without the serious side effects. PREVENTION  Maintain a healthy weight. Extra pounds put more strain on your joints.  Get strong, stay limber. Weak muscles are a common cause of knee injuries. Stretching is important. Include flexibility exercises in your workouts.  Be smart about exercise. If you have osteoarthritis, chronic knee pain or recurring injuries, you may need to change the way you exercise. This does not mean you have to stop being active. If your knees ache after jogging or playing basketball, consider switching to swimming, water aerobics, or other low-impact activities, at least for a few days a week. Sometimes limiting high-impact activities will provide relief.  Make sure your shoes fit well. Choose footwear that is right for your sport.  Protect your knees. Use the proper gear for  knee-sensitive activities. Use kneepads when playing volleyball or laying carpet. Buckle your seat belt every time you drive. Most shattered kneecaps occur in car accidents.  Rest when you are tired. SEEK MEDICAL CARE IF:  You have knee pain that is continual and does not seem to be getting better.  SEEK IMMEDIATE MEDICAL CARE IF:  Your knee joint feels hot to the touch and you have a high fever. MAKE SURE YOU:   Understand these instructions.  Will watch your condition.  Will get help right away if you are not doing well or get worse. Document Released: 07/17/2007 Document Revised: 12/12/2011 Document Reviewed: 07/17/2007 [facility] Patient  Information 2015 [place], [place]. This information is not intended to replace advice given to you by your health care provider. Make sure you discuss any questions you have with your health care provider.  Knee Bracing Knee braces are supports to help stabilize and protect an injured or painful knee. They come in many different styles. They should support and protect the knee without increasing the chance of other injuries to yourself or others. It is important not to have a false sense of security when using a brace. Knee braces that help you to keep using your knee:  Do not restore normal knee stability under high stress forces.  May decrease some aspects of athletic performance. Some of the different types of knee braces are:  Prophylactic knee braces are designed to prevent or reduce the severity of knee injuries during sports that make injury to the knee more likely.  Rehabilitative knee braces are designed to allow protected motion of:  Injured knees.  Knees that have been treated with or without surgery. There is no evidence that the use of a supportive knee brace protects the graft following a successful anterior cruciate ligament (ACL) reconstruction. However, braces are sometimes used to:   Protect injured ligaments.  Control knee movement during the initial healing period. They may be used as part of the treatment program for the various injured ligaments or cartilage of the knee including the:  Anterior cruciate ligament.  Medial collateral ligament.  Medial or lateral cartilage (meniscus).  Posterior cruciate ligament.  Lateral collateral ligament. Rehabilitative knee braces are most commonly used:  During crutch-assisted walking right after injury.  During crutch-assisted walking right after surgery to repair the cartilage and/or cruciate ligament injury.  For a short period of time, 2-8 weeks, after the injury or surgery. The value of a rehabilitative brace as  opposed to a cast or splint includes the:  Ability to adjust the brace for swelling.  Ability to remove the brace for examinations, icing, or showering.  Ability to allow for movement in a controlled range of motion. Functional knee braces give support to knees that have already been injured. They are designed to provide stability for the injured knee and provide protection after repair. Functional knee braces may not affect performance much. Lower extremity muscle strengthening, flexibility, and improvement in technique are more important than bracing in treating ligamentous knee injuries. Functional braces are not a substitute for rehabilitation or surgical procedures. Unloader/off-loader braces are designed to provide pain relief in arthritic knees. Patients with wear and tear arthritis from growing old or from an old cartilage injury (osteoarthritis) of the knee, and bowlegged (varus) or knock-knee (valgus) deformities, often develop increased pain in the arthritic side due to increased loading. Unloader/off-loader braces are made to reduce uneven loading in such knees. There is reduction in bowing out movement in bowlegged knees  when the correct unloader brace is used. Patients with advanced osteoarthritis or severe varus or valgus alignment problems would not likely benefit from bracing. Patellofemoral braces help the kneecap to move smoothly and well centered over the end of the femur in the knee.  Most people who wear knee braces feel that they help. However, there is a lack of scientific evidence that knee braces are helpful at the level needed for athletic participation to prevent injury. In spite of this, athletes report an increase in knee stability, pain relief, performance improvement, and confidence during athletics when using a brace.  Different knee problems require different knee braces:  Your caregiver may suggest one kind of knee brace after knee surgery.  A caregiver may choose  another kind of knee brace for support instead of surgery for some types of torn ligaments.  You may also need one for pain in the front of your knee that is not getting better with strengthening and flexibility exercises. Get your caregiver's advice if you want to try a knee brace. The caregiver will advise you on where to get them and provide a prescription when it is needed to fashion and/or fit the brace. Knee braces are the least important part of preventing knee injuries or getting better following injury. Stretching, strengthening and technique improvement are far more important in caring for and preventing knee injuries. When strengthening your knee, increase your activities a little at a time so as not to develop injuries from overuse. Work out an exercise plan with your caregiver and/or physical therapist to get the best program for you. Do not let a knee brace become a crutch. Always remember, there are no braces which support the knee as well as your original ligaments and cartilage you were born with. Conditioning, proper warm-up, and stretching remain the most important parts of keeping your knees healthy. HOW TO USE A KNEE BRACE  During sports, knee braces should be used as directed by your caregiver.  Make sure that the hinges are where the knee bends.  Straps, tapes, or hook-and-loop tapes should be fastened around your leg as instructed.  You should check the placement of the brace during activities to make sure that it has not moved. Poorly positioned braces can hurt rather than help you.  To work well, a knee brace should be worn during all activities that put you at risk of knee injury.  Warm up properly before beginning athletic activities. HOME CARE INSTRUCTIONS  Knee braces often get damaged during normal use. Replace worn-out braces for maximum benefit.  Clean regularly with soap and water.  Inspect your brace often for wear and tear.  Cover exposed metal to  protect others from injury.  Durable materials may cost more, but last longer. SEEK IMMEDIATE MEDICAL CARE IF:   Your knee seems to be getting worse rather than better.  You have increasing pain or swelling in the knee.  You have problems caused by the knee brace.  You have increased swelling or inflammation (redness or soreness) in your knee.  Your knee becomes warm and more painful and you develop an unexplained temperature over 101F (38.3C). MAKE SURE YOU:   Understand these instructions.  Will watch your condition.  Will get help right away if you are not doing well or get worse. See your caregiver, physical therapist, or orthopedic surgeon for additional information. Document Released: 12/10/2003 Document Revised: 02/03/2014 Document Reviewed: 03/18/2009 [facility] Patient Information 2015 [place], [place]. This information is not intended to  replace advice given to you by your health care provider. Make sure you discuss any questions you have with your health care provider. ° °

## 2015-06-01 ENCOUNTER — Encounter (HOSPITAL_COMMUNITY): Payer: Self-pay | Admitting: Emergency Medicine

## 2015-06-01 ENCOUNTER — Emergency Department (HOSPITAL_COMMUNITY): Payer: Self-pay

## 2015-06-01 ENCOUNTER — Emergency Department (HOSPITAL_COMMUNITY)
Admission: EM | Admit: 2015-06-01 | Discharge: 2015-06-01 | Disposition: A | Discharge status: Home / Self Care | Payer: Self-pay | Attending: Emergency Medicine | Admitting: Emergency Medicine

## 2015-06-01 DIAGNOSIS — Z79899 Other long term (current) drug therapy: Secondary | ICD-10-CM | POA: Insufficient documentation

## 2015-06-01 DIAGNOSIS — K219 Gastro-esophageal reflux disease without esophagitis: Secondary | ICD-10-CM | POA: Insufficient documentation

## 2015-06-01 DIAGNOSIS — S92311A Displaced fracture of first metatarsal bone, right foot, initial encounter for closed fracture: Secondary | ICD-10-CM | POA: Insufficient documentation

## 2015-06-01 DIAGNOSIS — S92334A Nondisplaced fracture of third metatarsal bone, right foot, initial encounter for closed fracture: Secondary | ICD-10-CM | POA: Insufficient documentation

## 2015-06-01 DIAGNOSIS — Z8711 Personal history of peptic ulcer disease: Secondary | ICD-10-CM | POA: Insufficient documentation

## 2015-06-01 DIAGNOSIS — Y9289 Other specified places as the place of occurrence of the external cause: Secondary | ICD-10-CM | POA: Insufficient documentation

## 2015-06-01 DIAGNOSIS — S92324A Nondisplaced fracture of second metatarsal bone, right foot, initial encounter for closed fracture: Secondary | ICD-10-CM | POA: Insufficient documentation

## 2015-06-01 DIAGNOSIS — Z880 Allergy status to penicillin: Secondary | ICD-10-CM | POA: Insufficient documentation

## 2015-06-01 DIAGNOSIS — Z720 Tobacco use: Secondary | ICD-10-CM | POA: Insufficient documentation

## 2015-06-01 DIAGNOSIS — S92341A Displaced fracture of fourth metatarsal bone, right foot, initial encounter for closed fracture: Secondary | ICD-10-CM | POA: Insufficient documentation

## 2015-06-01 DIAGNOSIS — Y998 Other external cause status: Secondary | ICD-10-CM | POA: Insufficient documentation

## 2015-06-01 DIAGNOSIS — W208XXA Other cause of strike by thrown, projected or falling object, initial encounter: Secondary | ICD-10-CM | POA: Insufficient documentation

## 2015-06-01 DIAGNOSIS — Y9389 Activity, other specified: Secondary | ICD-10-CM | POA: Insufficient documentation

## 2015-06-01 DIAGNOSIS — S92901A Unspecified fracture of right foot, initial encounter for closed fracture: Secondary | ICD-10-CM

## 2015-06-01 DIAGNOSIS — Z85038 Personal history of other malignant neoplasm of large intestine: Secondary | ICD-10-CM | POA: Insufficient documentation

## 2015-06-01 MED ORDER — METHOCARBAMOL 750 MG PO TABS: 750.0000 mg | ORAL_TABLET | Freq: Four times a day (QID) | ORAL | Status: DC

## 2015-06-01 MED ORDER — LORAZEPAM 2 MG/ML IJ SOLN
1.0000 mg | Freq: Once | INTRAMUSCULAR | Status: AC
  Administered 2015-06-01: 1 mg via INTRAVENOUS
  Filled 2015-06-01: qty 1

## 2015-06-01 MED ORDER — OXYCODONE-ACETAMINOPHEN 5-325 MG PO TABS: 2.0000 | ORAL_TABLET | ORAL | Status: DC | PRN

## 2015-06-01 MED ORDER — MORPHINE SULFATE (PF) 4 MG/ML IV SOLN
4.0000 mg | Freq: Once | INTRAVENOUS | Status: AC
  Administered 2015-06-01: 4 mg via INTRAVENOUS
  Filled 2015-06-01: qty 1

## 2015-06-01 MED ORDER — HYDROMORPHONE HCL 1 MG/ML IJ SOLN
2.0000 mg | Freq: Once | INTRAMUSCULAR | Status: AC
  Administered 2015-06-01: 2 mg via INTRAVENOUS
  Filled 2015-06-01: qty 2

## 2015-06-01 NOTE — Progress Notes (Signed)
Ortho tech, Jon phoned and will come down to see the pt. (2pm) left foot is extremely swollen and ecymotic.

## 2015-06-01 NOTE — ED Notes (Signed)
Pt was taking a trailer off a hitch and dropped it on his rt foot crush injury. Abrasion of skin to dorsal portion of foot, pt unable to feel palpation to small and 2nd toe, does feel palpation to big toe, pt will not let staff feel for pulses due to pain.

## 2015-06-01 NOTE — ED Provider Notes (Signed)
CSN: [id number]     Arrival date & time 06/01/15  1045 History   First MD Initiated Contact with Patient 06/01/15 1115     Chief Complaint  Patient presents with  . Foot Pain     (Consider location/radiation/quality/duration/timing/severity/associated sxs/prior Treatment) HPI Comments: Patient here after dropping heavy object onto his right foot just prior to arrival. Complains of severe pain characterized as sharp and worse with any kind of movement. Denies any numbness or tingling to his toes. No other injuries noted. Symptoms better with remaining still. No treatment use prior to arrival  Patient is a 49 y.o. male presenting with lower extremity pain. The history is provided by the patient.  Foot Pain    Past Medical History  Diagnosis Date  . Colon cancer   . Acid reflux   . PUD (peptic ulcer disease)   . Colon cancer    Past Surgical History  Procedure Laterality Date  . Colon resection     No family history on file. Social History  Substance Use Topics  . Smoking status: Current Every Day Smoker -- 1.00 packs/day  . Smokeless tobacco: None  . Alcohol Use: Yes     Comment: occasionally    Review of Systems  All other systems reviewed and are negative.     Allergies  Penicillins  Home Medications   Prior to Admission medications   Medication Sig Start Date End Date Taking? Authorizing Provider  esomeprazole (NEXIUM) 40 MG capsule Take 40 mg by mouth daily at 12 noon.   Yes Historical Provider, MD  meloxicam (MOBIC) 7.5 MG tablet Take 1 tablet (7.5 mg total) by mouth daily. Patient not taking: Reported on 06/01/2015 06/27/14   Clayton Bibles, PA-C   BP 180/106 mmHg  Pulse 89  Temp(Src) 98 F (36.7 C) (Oral)  Resp 22  SpO2 100% Physical Exam  Constitutional: He is oriented to person, place, and time. He appears well-developed and well-nourished.  Non-toxic appearance. No distress.  HENT:  Head: Normocephalic and atraumatic.  Eyes: Conjunctivae, EOM and lids  are normal. Pupils are equal, round, and reactive to light.  Neck: Normal range of motion. Neck supple. No tracheal deviation present. No thyroid mass present.  Cardiovascular: Normal rate, regular rhythm and normal heart sounds.  Exam reveals no gallop.   No murmur heard. Pulmonary/Chest: Effort normal and breath sounds normal. No stridor. No respiratory distress. He has no decreased breath sounds. He has no wheezes. He has no rhonchi. He has no rales.  Abdominal: Soft. Normal appearance and bowel sounds are normal. He exhibits no distension. There is no tenderness. There is no rebound and no CVA tenderness.  Musculoskeletal: Normal range of motion. He exhibits no edema or tenderness.       Feet:  Neurological: He is alert and oriented to person, place, and time. He has normal strength. No cranial nerve deficit or sensory deficit. GCS eye subscore is 4. GCS verbal subscore is 5. GCS motor subscore is 6.  Skin: Skin is warm and dry. No abrasion and no rash noted.  Psychiatric: He has a normal mood and affect. His speech is normal and behavior is normal.  Nursing note and vitals reviewed.   ED Course  Procedures (including critical care time) Labs Review Labs Reviewed - No data to display  Imaging Review No results found. I have personally reviewed and evaluated these images and lab results as part of my medical decision-making.   EKG Interpretation None  MDM   Final diagnoses:  None    Patient given IV hydromorphone along with Ativan and feels better. His foot was reexamined multiple times and shows a sinus compartment syndrome. Able to wiggle his toes without difficulty. Denies any numbness to his toes. Spoke with Dr. Tamera Punt from orthopedics and he will see the patient in 2 days for follow-up. Strict return precautions given to the patient including monitoring for compartment syndrome.  Lacretia Leigh, MD 06/01/15 1340

## 2015-06-01 NOTE — ED Notes (Signed)
Patient transported to X-ray 

## 2015-06-01 NOTE — Discharge Instructions (Signed)
Do not walk on your foot. Keep your foot elevated at the level of your nose. Cast or Splint Care Casts and splints support injured limbs and keep bones from moving while they heal. It is important to care for your cast or splint at home.  HOME CARE INSTRUCTIONS  Keep the cast or splint uncovered during the drying period. It can take 24 to 48 hours to dry if it is made of plaster. A fiberglass cast will dry in less than 1 hour.  Do not rest the cast on anything harder than a pillow for the first 24 hours.  Do not put weight on your injured limb or apply pressure to the cast until your health care provider gives you permission.  Keep the cast or splint dry. Wet casts or splints can lose their shape and may not support the limb as well. A wet cast that has lost its shape can also create harmful pressure on your skin when it dries. Also, wet skin can become infected.  Cover the cast or splint with a plastic bag when bathing or when out in the rain or snow. If the cast is on the trunk of the body, take sponge baths until the cast is removed.  If your cast does become wet, dry it with a towel or a blow dryer on the cool setting only.  Keep your cast or splint clean. Soiled casts may be wiped with a moistened cloth.  Do not place any hard or soft foreign objects under your cast or splint, such as cotton, toilet paper, lotion, or powder.  Do not try to scratch the skin under the cast with any object. The object could get stuck inside the cast. Also, scratching could lead to an infection. If itching is a problem, use a blow dryer on a cool setting to relieve discomfort.  Do not trim or cut your cast or remove padding from inside of it.  Exercise all joints next to the injury that are not immobilized by the cast or splint. For example, if you have a long leg cast, exercise the hip joint and toes. If you have an arm cast or splint, exercise the shoulder, elbow, thumb, and fingers.  Elevate your  injured arm or leg on 1 or 2 pillows for the first 1 to 3 days to decrease swelling and pain.It is best if you can comfortably elevate your cast so it is higher than your heart. SEEK MEDICAL CARE IF:   Your cast or splint cracks.  Your cast or splint is too tight or too loose.  You have unbearable itching inside the cast.  Your cast becomes wet or develops a soft spot or area.  You have a bad smell coming from inside your cast.  You get an object stuck under your cast.  Your skin around the cast becomes red or raw.  You have new pain or worsening pain after the cast has been applied. SEEK IMMEDIATE MEDICAL CARE IF:   You have fluid leaking through the cast.  You are unable to move your fingers or toes.  You have discolored (blue or white), cool, painful, or very swollen fingers or toes beyond the cast.  You have tingling or numbness around the injured area.  You have severe pain or pressure under the cast.  You have any difficulty with your breathing or have shortness of breath.  You have chest pain. Document Released: 09/16/2000 Document Revised: 07/10/2013 Document Reviewed: 03/28/2013 [facility] Patient Information 2015 [employer]. This information is not intended to replace advice given to you by your health care provider. Make sure you discuss any questions you have with your health care provider. Compartment Syndrome of the Foot Compartment syndrome of the foot is a condition in which increased tissue pressure in a confined space in your foot causes decreased blood flow. Decreased blood flow can lead to muscle weakness and loss of feeling in your foot. Compartments of your foot contain bones, muscles, blood vessels, and nerves that are wrapped tightly together by tough fibrous tissues (fascia). When an injury occurs to these compartments, there is no room for the tissue to swell. The dangerously high pressure in compartment syndrome restricts the flow of blood to and  from the injured areas. Having the syndrome can be an emergency requiring immediate surgery to prevent permanent injury. CAUSES A crushing type of injury to your foot. RISK FACTORS  Blood clots.  Surgery to blood vessels of your leg or foot.  Prolonged compression during a period of consciousness.  Extremely vigorous exercise.  Anabolic steroid use.  Trauma.  An infection throughout the body (sepsis).  Overly tight bandages or casts.  Bites from venomous animals like snakes.  Anticoagulant medicine use that leads to excessive bleeding into the compartments after an injury.  Burns. SIGNS AND SYMPTOMS  New and persistent deep ache.  Pain that is greater than expected for the severity of an injury.  Numbness, a "pins-and-needles," or electricity-like pain in the foot.  Swelling and tightness.  Bruising. DIAGNOSIS  Your health care provider will perform a physical exam. Sometimes pressures are taken within the compartment. TREATMENT  Treatment involves relief of pressure in the compartment by cutting the fascia that surrounds it (fasciotomy). You will remain in the hospital while the cuts (incisions) are usually left open for several days. When the swelling has gone away, your incision will be closed.  Document Released: 12/26/2000 Document Revised: 09/24/2013 Document Reviewed: 05/08/2013 [facility] Patient Information 2015 [place], [place]. This information is not intended to replace advice given to you by your health care provider. Make sure you discuss any questions you have with your health care provider. Crutch Use Crutches are used to take weight off one of your legs or feet when you stand or walk. It is important to use crutches that fit properly. When fitted properly:  Each crutch should be 2-3 finger widths below the armpit.  Your weight should be supported by your hand, and not by resting the armpit on the crutch.  RISKS AND COMPLICATIONS Damage to the nerves  that extend from your armpit to your hand and arm. To prevent this from happening, make sure your crutches fit properly and do not put pressure on your armpit when using them. HOW TO USE YOUR CRUTCHES If you have been instructed to use partial weight bearing, apply (bear) the amount of weight as your health care provider suggests. Do not bear weight in an amount that causes pain to the area of injury. Walking  Step with the crutches.  Swing the healthy leg slightly ahead of the crutches. Going Up Steps If there is no handrail:  Step up with the healthy leg.  Step up with the crutches and injured leg.  Continue in this way. If there is a handrail: 1. Hold both crutches in one hand. 2. Place your free hand on the handrail. 3. While putting your weight on your arms, lift your healthy leg to the step. 4. Bring the crutches and the  injured leg up to that step. 5. Continue in this way. Going Down Steps Be very careful, as going down stairs with crutches is very challenging. If there is no handrail: 1. Step down with the injured leg and crutches. 2. Step down with the healthy leg. If there is a handrail: 1. Place your hand on the handrail. 2. Hold both crutches with your free hand. 3. Lower your injured leg and crutch to the step below you. Make sure to keep the crutch tips in the center of the step, never on the edge. 4. Lower your healthy leg to that step. 5. Continue in this way. Standing Up 1. Hold the injured leg forward. 2. Grab the armrest with one hand and the top of the crutches with the other hand. 3. Using these supports, pull yourself up to a standing position. Sitting Down 1. Hold the injured leg forward. 2. Grab the armrest with one hand and the top of the crutches with the other hand. 3. Lower yourself to a sitting position. SEEK MEDICAL CARE IF:  You still feel unsteady on your feet.  You develop new pain, for example in your armpits, back, shoulder, wrist, or  hip.  You develop any numbness or tingling. SEEK IMMEDIATE MEDICAL CARE IF: You fall. Document Released: 09/16/2000 Document Revised: 09/24/2013 Document Reviewed: 05/27/2013 [facility] Patient Information 2015 [place], [place]. This information is not intended to replace advice given to you by your health care provider. Make sure you discuss any questions you have with your health care provider.

## 2015-06-01 NOTE — ED Notes (Signed)
Jones dressing per ortho tech. Pt alertx4 respirations easy non labored. wc to car.

## 2015-06-03 NOTE — Progress Notes (Signed)
ED Cm consulted by ED unit secretary after a call from pt indicating he was unable to get an appt to see Dr Tamera Punt as discussed in [facility] ED and in EDP note. Pt left a number of [phone] to be reached   Cm called the Dr Tamera Punt office and spoke with Theadora Rama at [phone] to discuss this Theadora Rama states she was given permission to add pt in for an appt today but unable to reach pt Cm and Brandy compared contact numbers Ragan corrected the number she had for pt and states she will contact him to offer his appt

## 2016-08-20 ENCOUNTER — Inpatient Hospital Stay (HOSPITAL_COMMUNITY)
Admission: EM | Admit: 2016-08-20 | Discharge: 2016-08-27 | DRG: 896 | Disposition: A | Discharge status: Home / Self Care | Payer: Self-pay | Attending: Internal Medicine | Admitting: Internal Medicine

## 2016-08-20 ENCOUNTER — Emergency Department (HOSPITAL_COMMUNITY): Payer: Self-pay

## 2016-08-20 ENCOUNTER — Encounter (HOSPITAL_COMMUNITY): Payer: Self-pay

## 2016-08-20 DIAGNOSIS — Y905 Blood alcohol level of 100-119 mg/100 ml: Secondary | ICD-10-CM | POA: Diagnosis present

## 2016-08-20 DIAGNOSIS — K219 Gastro-esophageal reflux disease without esophagitis: Secondary | ICD-10-CM | POA: Diagnosis present

## 2016-08-20 DIAGNOSIS — F10239 Alcohol dependence with withdrawal, unspecified: Secondary | ICD-10-CM

## 2016-08-20 DIAGNOSIS — K760 Fatty (change of) liver, not elsewhere classified: Secondary | ICD-10-CM | POA: Diagnosis present

## 2016-08-20 DIAGNOSIS — D696 Thrombocytopenia, unspecified: Secondary | ICD-10-CM | POA: Diagnosis present

## 2016-08-20 DIAGNOSIS — E872 Acidosis, unspecified: Secondary | ICD-10-CM | POA: Diagnosis present

## 2016-08-20 DIAGNOSIS — Z85038 Personal history of other malignant neoplasm of large intestine: Secondary | ICD-10-CM

## 2016-08-20 DIAGNOSIS — F10931 Alcohol use, unspecified with withdrawal delirium: Secondary | ICD-10-CM

## 2016-08-20 DIAGNOSIS — I10 Essential (primary) hypertension: Secondary | ICD-10-CM | POA: Diagnosis present

## 2016-08-20 DIAGNOSIS — R45851 Suicidal ideations: Secondary | ICD-10-CM | POA: Diagnosis not present

## 2016-08-20 DIAGNOSIS — G9341 Metabolic encephalopathy: Secondary | ICD-10-CM | POA: Diagnosis present

## 2016-08-20 DIAGNOSIS — E162 Hypoglycemia, unspecified: Secondary | ICD-10-CM | POA: Diagnosis present

## 2016-08-20 DIAGNOSIS — A419 Sepsis, unspecified organism: Secondary | ICD-10-CM

## 2016-08-20 DIAGNOSIS — J690 Pneumonitis due to inhalation of food and vomit: Secondary | ICD-10-CM

## 2016-08-20 DIAGNOSIS — K7290 Hepatic failure, unspecified without coma: Secondary | ICD-10-CM | POA: Diagnosis present

## 2016-08-20 DIAGNOSIS — F329 Major depressive disorder, single episode, unspecified: Secondary | ICD-10-CM | POA: Diagnosis present

## 2016-08-20 DIAGNOSIS — R05 Cough: Secondary | ICD-10-CM

## 2016-08-20 DIAGNOSIS — Z800 Family history of malignant neoplasm of digestive organs: Secondary | ICD-10-CM

## 2016-08-20 DIAGNOSIS — Z886 Allergy status to analgesic agent status: Secondary | ICD-10-CM

## 2016-08-20 DIAGNOSIS — E876 Hypokalemia: Secondary | ICD-10-CM | POA: Diagnosis present

## 2016-08-20 DIAGNOSIS — Z781 Physical restraint status: Secondary | ICD-10-CM

## 2016-08-20 DIAGNOSIS — Z8711 Personal history of peptic ulcer disease: Secondary | ICD-10-CM

## 2016-08-20 DIAGNOSIS — D649 Anemia, unspecified: Secondary | ICD-10-CM | POA: Diagnosis present

## 2016-08-20 DIAGNOSIS — R112 Nausea with vomiting, unspecified: Secondary | ICD-10-CM

## 2016-08-20 DIAGNOSIS — F1010 Alcohol abuse, uncomplicated: Secondary | ICD-10-CM

## 2016-08-20 DIAGNOSIS — E860 Dehydration: Secondary | ICD-10-CM

## 2016-08-20 DIAGNOSIS — F17210 Nicotine dependence, cigarettes, uncomplicated: Secondary | ICD-10-CM | POA: Diagnosis present

## 2016-08-20 DIAGNOSIS — E871 Hypo-osmolality and hyponatremia: Secondary | ICD-10-CM | POA: Diagnosis present

## 2016-08-20 DIAGNOSIS — F10231 Alcohol dependence with withdrawal delirium: Principal | ICD-10-CM | POA: Diagnosis present

## 2016-08-20 DIAGNOSIS — Z880 Allergy status to penicillin: Secondary | ICD-10-CM

## 2016-08-20 DIAGNOSIS — R059 Cough, unspecified: Secondary | ICD-10-CM

## 2016-08-20 DIAGNOSIS — D72819 Decreased white blood cell count, unspecified: Secondary | ICD-10-CM | POA: Diagnosis present

## 2016-08-20 HISTORY — DX: Alcohol use, unspecified with withdrawal delirium: F10.931

## 2016-08-20 HISTORY — DX: Acidosis: E87.2

## 2016-08-20 HISTORY — DX: Acidosis, unspecified: E87.20

## 2016-08-20 HISTORY — DX: Sepsis, unspecified organism: A41.9

## 2016-08-20 HISTORY — DX: Alcohol dependence with withdrawal delirium: F10.231

## 2016-08-20 HISTORY — DX: Pneumonitis due to inhalation of food and vomit: J69.0

## 2016-08-20 LAB — BLOOD GAS, VENOUS
ACID-BASE DEFICIT: 22.5 mmol/L — AB (ref 0.0–2.0)
BICARBONATE: 9 mmol/L — AB (ref 20.0–28.0)
FIO2: 0.21
O2 Saturation: 97.9 %
PH VEN: 7.021 — AB (ref 7.250–7.430)
Patient temperature: 98.6
pCO2, Ven: 36.6 mmHg — ABNORMAL LOW (ref 44.0–60.0)
pO2, Ven: 184 mmHg — ABNORMAL HIGH (ref 32.0–45.0)

## 2016-08-20 LAB — CBC
HCT: 46 % (ref 39.0–52.0)
HEMOGLOBIN: 15.7 g/dL (ref 13.0–17.0)
MCH: 34.2 pg — ABNORMAL HIGH (ref 26.0–34.0)
MCHC: 34.1 g/dL (ref 30.0–36.0)
MCV: 100.2 fL — ABNORMAL HIGH (ref 78.0–100.0)
Platelets: 185 10*3/uL (ref 150–400)
RBC: 4.59 MIL/uL (ref 4.22–5.81)
RDW: 13.3 % (ref 11.5–15.5)
WBC: 11.4 10*3/uL — AB (ref 4.0–10.5)

## 2016-08-20 LAB — URINE MICROSCOPIC-ADD ON: RBC / HPF: NONE SEEN RBC/hpf (ref 0–5)

## 2016-08-20 LAB — URINALYSIS, ROUTINE W REFLEX MICROSCOPIC
Bilirubin Urine: NEGATIVE
Glucose, UA: 500 mg/dL — AB
Ketones, ur: >80 mg/dL — AB
Leukocytes, UA: NEGATIVE
NITRITE: NEGATIVE
Protein, ur: 30 mg/dL — AB
SPECIFIC GRAVITY, URINE: 1.03 (ref 1.005–1.030)
pH: 5.5 (ref 5.0–8.0)

## 2016-08-20 LAB — RAPID URINE DRUG SCREEN, HOSP PERFORMED
Amphetamines: NOT DETECTED
BARBITURATES: NOT DETECTED
Benzodiazepines: NOT DETECTED
COCAINE: NOT DETECTED
OPIATES: NOT DETECTED
TETRAHYDROCANNABINOL: POSITIVE — AB

## 2016-08-20 LAB — LIPASE, BLOOD: Lipase: 38 U/L (ref 11–51)

## 2016-08-20 LAB — ETHANOL: Alcohol, Ethyl (B): 103 mg/dL — ABNORMAL HIGH (ref <5)

## 2016-08-20 LAB — COMPREHENSIVE METABOLIC PANEL
ALK PHOS: 79 U/L (ref 38–126)
ALT: 53 U/L (ref 17–63)
ANION GAP: 26 — AB (ref 5–15)
AST: 200 U/L — ABNORMAL HIGH (ref 15–41)
Albumin: 5 g/dL (ref 3.5–5.0)
BILIRUBIN TOTAL: 1.7 mg/dL — AB (ref 0.3–1.2)
BUN: 13 mg/dL (ref 6–20)
CALCIUM: 9.4 mg/dL (ref 8.9–10.3)
CO2: 7 mmol/L — ABNORMAL LOW (ref 22–32)
Chloride: 102 mmol/L (ref 101–111)
Creatinine, Ser: 1.09 mg/dL (ref 0.61–1.24)
GFR calc Af Amer: >60 mL/min (ref >60)
GFR calc non Af Amer: >60 mL/min (ref >60)
Glucose, Bld: 48 mg/dL — ABNORMAL LOW (ref 65–99)
Potassium: 5.7 mmol/L — ABNORMAL HIGH (ref 3.5–5.1)
Sodium: 135 mmol/L (ref 135–145)
TOTAL PROTEIN: 9 g/dL — AB (ref 6.5–8.1)

## 2016-08-20 LAB — I-STAT CG4 LACTIC ACID, ED: LACTIC ACID, VENOUS: 5.19 mmol/L — AB (ref 0.5–1.9)

## 2016-08-20 LAB — CBG MONITORING, ED
GLUCOSE-CAPILLARY: 63 mg/dL — AB (ref 65–99)
GLUCOSE-CAPILLARY: 119 mg/dL — AB (ref 65–99)

## 2016-08-20 LAB — SALICYLATE LEVEL: Salicylate Lvl: <7 mg/dL (ref 2.8–30.0)

## 2016-08-20 MED ORDER — KETOROLAC TROMETHAMINE 30 MG/ML IJ SOLN
30.0000 mg | Freq: Once | INTRAMUSCULAR | Status: AC
  Administered 2016-08-20: 30 mg via INTRAVENOUS
  Filled 2016-08-20: qty 1

## 2016-08-20 MED ORDER — LEVOFLOXACIN IN D5W 750 MG/150ML IV SOLN
750.0000 mg | Freq: Once | INTRAVENOUS | Status: AC
  Administered 2016-08-21: 750 mg via INTRAVENOUS
  Filled 2016-08-20: qty 150

## 2016-08-20 MED ORDER — SODIUM CHLORIDE 0.9 % IJ SOLN
INTRAMUSCULAR | Status: AC
  Filled 2016-08-20: qty 50

## 2016-08-20 MED ORDER — FAMOTIDINE IN NACL 20-0.9 MG/50ML-% IV SOLN
20.0000 mg | INTRAVENOUS | Status: AC
  Administered 2016-08-20: 20 mg via INTRAVENOUS
  Filled 2016-08-20: qty 50

## 2016-08-20 MED ORDER — SODIUM CHLORIDE 0.9 % IV BOLUS (SEPSIS): 1000.0000 mL | Freq: Once | INTRAVENOUS | Status: DC

## 2016-08-20 MED ORDER — LORAZEPAM 2 MG/ML IJ SOLN
1.0000 mg | Freq: Once | INTRAMUSCULAR | Status: AC
  Administered 2016-08-21: 1 mg via INTRAVENOUS
  Filled 2016-08-20: qty 1

## 2016-08-20 MED ORDER — VANCOMYCIN HCL IN DEXTROSE 1-5 GM/200ML-% IV SOLN
1000.0000 mg | Freq: Once | INTRAVENOUS | Status: AC
  Administered 2016-08-21: 1000 mg via INTRAVENOUS
  Filled 2016-08-20: qty 200

## 2016-08-20 MED ORDER — SODIUM CHLORIDE 0.9 % IV BOLUS (SEPSIS)
2000.0000 mL | Freq: Once | INTRAVENOUS | Status: AC
  Administered 2016-08-20: 2000 mL via INTRAVENOUS

## 2016-08-20 MED ORDER — IOPAMIDOL (ISOVUE-300) INJECTION 61%
INTRAVENOUS | Status: AC
Start: 2016-08-20 — End: 2016-08-21
  Filled 2016-08-20: qty 100

## 2016-08-20 MED ORDER — DICYCLOMINE HCL 10 MG/ML IM SOLN
20.0000 mg | Freq: Once | INTRAMUSCULAR | Status: AC
  Administered 2016-08-20: 20 mg via INTRAMUSCULAR
  Filled 2016-08-20: qty 2

## 2016-08-20 MED ORDER — PROMETHAZINE HCL 25 MG/ML IJ SOLN
12.5000 mg | Freq: Once | INTRAMUSCULAR | Status: AC
  Administered 2016-08-20: 12.5 mg via INTRAVENOUS
  Filled 2016-08-20: qty 1

## 2016-08-20 MED ORDER — ONDANSETRON HCL 4 MG/2ML IJ SOLN
4.0000 mg | Freq: Once | INTRAMUSCULAR | Status: AC
  Administered 2016-08-20: 4 mg via INTRAVENOUS
  Filled 2016-08-20: qty 2

## 2016-08-20 MED ORDER — THIAMINE HCL 100 MG/ML IJ SOLN
Freq: Once | INTRAVENOUS | Status: AC
  Administered 2016-08-21: via INTRAVENOUS
  Filled 2016-08-20: qty 1000

## 2016-08-20 MED ORDER — HYDROMORPHONE HCL 1 MG/ML IJ SOLN
1.0000 mg | Freq: Once | INTRAMUSCULAR | Status: AC
  Administered 2016-08-20: 1 mg via INTRAVENOUS
  Filled 2016-08-20: qty 1

## 2016-08-20 MED ORDER — IOPAMIDOL (ISOVUE-300) INJECTION 61%
100.0000 mL | Freq: Once | INTRAVENOUS | Status: AC | PRN
  Administered 2016-08-20: 100 mL via INTRAVENOUS

## 2016-08-20 MED ORDER — DEXTROSE 50 % IV SOLN
1.0000 | Freq: Once | INTRAVENOUS | Status: AC
  Administered 2016-08-20: 50 mL via INTRAVENOUS
  Filled 2016-08-20: qty 50

## 2016-08-20 MED ORDER — GLUCOSE 40 % PO GEL
1.0000 | Freq: Once | ORAL | Status: DC
  Filled 2016-08-20: qty 1

## 2016-08-20 NOTE — ED Notes (Signed)
Pt Called from lobby with no response

## 2016-08-20 NOTE — ED Triage Notes (Signed)
Pt presents with c/o abdominal pain and vomiting for the past 2 days. Pt reports he has been around someone with the same symptoms earlier this week. Pt denies any diarrhea.

## 2016-08-20 NOTE — ED Provider Notes (Signed)
[facility] DEPT Provider Note   CSN: [id number] Arrival date & time: 08/20/16  1826  By signing my name below, I, Doran Stabler, attest that this documentation has been prepared under the direction and in the presence of [employer], PA-C. Electronically Signed: Doran Stabler, ED Scribe. 08/20/16. 9:47 PM.   History   Chief Complaint Chief Complaint  Patient presents with  . Abdominal Pain  . Emesis   The history is provided by the patient and a relative. No language interpreter was used.   HPI Comments: Richard [name] is a 50 y.o. male who presents to the Emergency Department PMHx of colon cancer complaining of sudden onset of emesis that began 2 days ago. Family also reports decreased appetite, abdominal pain and diaphoresis. Pt has not taken any OTC medication for his symptoms.  Pt denies any fevers, chills, CP, SOB, diarrhea, or any other symptoms at this time. Pt states he has been around someone with similar symptoms.   Colon resection 2007. Patient reports that he drinks fairly regularly, ranging from 1 40-ounce beer to 1 pint. Last drink Thursday; patient reports having 3 shots.   Past Medical History:  Diagnosis Date  . Acid reflux   . Colon cancer ([place])   . Colon cancer ([place])   . PUD (peptic ulcer disease)    Patient Active Problem List   Diagnosis Date Noted  . 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 Dehydration 08/20/2016  . Intractable nausea and vomiting 08/20/2016   Past Surgical History:  Procedure Laterality Date  . COLON RESECTION      Home Medications    Prior to Admission medications   Medication Sig Start Date End Date Taking? Authorizing Provider  esomeprazole (NEXIUM) 40 MG capsule Take 40 mg by mouth daily at 12 noon.   Yes Historical Provider, MD   Family History No family history on file.  Social History Social History  Substance Use Topics  . Smoking status: Current Every Day Smoker    Packs/day: 1.00  . Smokeless tobacco: Not on  file  . Alcohol use Yes     Comment: occasionally    Allergies   Aspirin and Penicillins  Review of Systems Review of Systems A complete 10 system review of systems was obtained and all systems are negative except as noted in the HPI and PMH.    Physical Exam Updated Vital Signs BP 161/70   Pulse 90   Temp 98.1 F (36.7 C) (Oral)   Resp 18   Ht 6' (1.829 m)   Wt 65.8 kg   SpO2 100%   BMI 19.67 kg/m   Physical Exam  Constitutional: He is oriented to person, place, and time. He appears well-developed and well-nourished. No distress.  Thin in appearance. Actively vomiting. Mildly diaphoretic.  HENT:  Head: Normocephalic and atraumatic.  Eyes: Conjunctivae and EOM are normal. No scleral icterus.  Neck: Normal range of motion.  Cardiovascular: Normal rate, regular rhythm and intact distal pulses.   Pulmonary/Chest: Effort normal. No respiratory distress. He has no wheezes. He has no rales.  Lungs CTAB  Abdominal: Soft. He exhibits no distension and no mass. There is tenderness. There is no guarding.  Periumbilical TTP. No distension or peritoneal signs.  Musculoskeletal: Normal range of motion.  Neurological: He is alert and oriented to person, place, and time. He exhibits normal muscle tone. Coordination normal.  GCS 15. Patient moving all extremities.  Skin: Skin is warm. No rash noted. He is diaphoretic. No erythema. No pallor.  Psychiatric: He has a normal mood and affect. His behavior is normal.  Nursing note and vitals reviewed.   ED Treatments / Results  DIAGNOSTIC STUDIES: Oxygen Saturation is 100% on room air, normal by my interpretation.    COORDINATION OF CARE: 9:47 PM Discussed treatment plan with pt at bedside and pt agreed to plan.  Labs (all labs ordered are listed, but only abnormal results are displayed) Labs Reviewed  COMPREHENSIVE METABOLIC PANEL - Abnormal; Notable for the following:       Result Value   Potassium 5.7 (*)    CO2 7 (*)     Glucose, Bld 48 (*)    Total Protein 9.0 (*)    AST 200 (*)    Total Bilirubin 1.7 (*)    Anion gap 26 (*)    All other components within normal limits  CBC - Abnormal; Notable for the following:    WBC 11.4 (*)    MCV 100.2 (*)    MCH 34.2 (*)    All other components within normal limits  BLOOD GAS, VENOUS - Abnormal; Notable for the following:    pH, Ven 7.021 (*)    pCO2, Ven 36.6 (*)    pO2, Ven 184.0 (*)    Bicarbonate 9.0 (*)    Acid-base deficit 22.5 (*)    All other components within normal limits  CBG MONITORING, ED - Abnormal; Notable for the following:    Glucose-Capillary 63 (*)    All other components within normal limits  I-STAT CG4 LACTIC ACID, ED - Abnormal; Notable for the following:    Lactic Acid, Venous 5.19 (*)    All other components within normal limits  CBG MONITORING, ED - Abnormal; Notable for the following:    Glucose-Capillary 119 (*)    All other components within normal limits  LIPASE, BLOOD  SALICYLATE LEVEL  URINALYSIS, ROUTINE W REFLEX MICROSCOPIC (NOT AT [facility])  POTASSIUM  ETHANOL  RAPID URINE DRUG SCREEN, HOSP PERFORMED  CK  MAGNESIUM  PHOSPHORUS   EKG  EKG Interpretation None      Radiology Ct Abdomen Pelvis W Contrast  Result Date: 08/20/2016 CLINICAL DATA:  Sudden onset of emesis, 2 days ago. Abdominal pain and diaphoresis. EXAM: CT ABDOMEN AND PELVIS WITH CONTRAST TECHNIQUE: Multidetector CT imaging of the abdomen and pelvis was performed using the standard protocol following bolus administration of intravenous contrast. CONTRAST:  116mL ISOVUE-300 IOPAMIDOL (ISOVUE-300) INJECTION 61% COMPARISON:  02/04/2013 FINDINGS: Lower chest: Right lower lobe opacities, tree-in-bud distribution. This could represent aspiration. Pneumonia not excluded. Hepatobiliary: Diffuse fatty infiltration of the liver without significant focal lesion. Gallbladder and bile ducts are unremarkable. Pancreas: Unremarkable. No pancreatic ductal dilatation or  surrounding inflammatory changes. Spleen: Normal in size without focal abnormality. Adrenals/Urinary Tract: Adrenal glands are unremarkable. Kidneys are normal, without renal calculi, focal lesion, or hydronephrosis. Bladder is unremarkable. Stomach/Bowel: Stomach, small bowel and colon are unremarkable. No bowel obstruction. No bowel inflammation. No extraluminal air. Vascular/Lymphatic: The abdominal aorta is normal in caliber with mild atherosclerotic calcification. No pathologic adenopathy is evident in the abdomen or pelvis. Reproductive: Unremarkable Other: No ascites. Musculoskeletal: No acute or significant osseous findings. IMPRESSION: 1. Hepatic steatosis without focal lesion. 2. Right lower lobe lung base opacities, suggesting aspiration or pneumonia. Electronically Signed   By: Andreas Newport M.D.   On: 08/20/2016 23:01    Procedures Procedures (including critical care time)  Medications Ordered in ED Medications  iopamidol (ISOVUE-300) 61 % injection (not administered)  sodium chloride 0.9 %  injection (not administered)  dextrose (GLUTOSE) 40 % oral gel 37.5 g (not administered)  sodium chloride 0.9 % 1,000 mL with thiamine 123XX123 mg, folic acid 1 mg, multivitamins adult 10 mL infusion (not administered)  sodium chloride 0.9 % bolus 2,000 mL (2,000 mLs Intravenous New Bag/Given 08/20/16 2157)  ketorolac (TORADOL) 30 MG/ML injection 30 mg (30 mg Intravenous Given 08/20/16 2158)  ondansetron (ZOFRAN) injection 4 mg (4 mg Intravenous Given 08/20/16 2158)  dicyclomine (BENTYL) injection 20 mg (20 mg Intramuscular Given 08/20/16 2202)  promethazine (PHENERGAN) injection 12.5 mg (12.5 mg Intravenous Given 08/20/16 2208)  dextrose 50 % solution 50 mL (50 mLs Intravenous Given 08/20/16 2200)  famotidine (PEPCID) IVPB 20 mg premix (0 mg Intravenous Stopped 08/20/16 2235)  iopamidol (ISOVUE-300) 61 % injection 100 mL (100 mLs Intravenous Contrast Given 08/20/16 2232)  HYDROmorphone (DILAUDID)  injection 1 mg (1 mg Intravenous Given 08/20/16 2315)    Initial Impression / Assessment and Plan / ED Course  I have reviewed the triage vital signs and the nursing notes.  Pertinent labs & imaging results that were available during my care of the patient were reviewed by me and considered in my medical decision making (see chart for details).  Clinical Course     50 year old male presents to the emergency department for persistent nausea and vomiting. He does report being around an individual sick with similar symptoms; however patient also reports a history of regular alcohol use. Question whether his symptoms may be due to alcohol withdrawal. He states that his last drink was 2 days ago before onset of his emesis. Patient with a lactic acidosis. He was hypoglycemic on arrival. This improved after an amp of D50. Patient complaining of abdominal pain which is suspected to be secondary to retching. He has a history of colon cancer and resection; CT scan today shows no acute process. No tachycardia, hypotension, tachypnea, fever, or significant leukocytosis to indicate SIRS or SEPSIS at the present time.  Case discussed with Dr. Roel Cluck of [facility] who will admit. Patient anticipated to need placement in Step Down for close monitoring and medical management.   Final Clinical Impressions(s) / ED Diagnoses   Final diagnoses:  Intractable vomiting with nausea, unspecified vomiting type  Lactic acidosis  Hypoglycemia    New Prescriptions New Prescriptions   No medications on file    I personally performed the services described in this documentation, which was scribed in my presence. The recorded information has been reviewed and is accurate.       Antonietta Breach, PA-C 08/20/16 [id number]    Gareth Morgan, MD 08/22/16 2116

## 2016-08-20 NOTE — H&P (Signed)
Richard [name] [id number] DOB: 1966/07/29 DOA: 08/20/2016     PCP: none Outpatient Specialists: none   Patient coming from:    home Lives  With family sister    Chief Complaint: Abdominal pain and vomiting  HPI: Richard [name] is a 49 y.o. male with medical history significant of alcohol abuse and remote history of colon cancer, PUD    Presented with 2 day history of abdominal pain diffuse nausea and vomiting no diarrhea. Reports sick contacts of somebody had similar presentation. His been associated decreased appetite diaphoresis. Patient denies taking over-the-counter medications for his symptoms. From the [place] fevers or chills no associated chest pain shortness of breath. No melena. Patient drinks at least 40 ounces beers a day sometimes up to a pint last alcoholic drink was Thursday when he had 3 shots of liquor. Has hx of withdrawal with shaking. No hx of seizures.  He reports some coughing.    Regarding pertinent Chronic problems: Patient is status post colon resection 2007 He has multiple visits over the years to emergency department secondary to trauma and alcohol intoxication  IN ER:  Temp (24hrs), Avg:98.1 F (36.7 C), Min:98.1 F (36.7 C), Max:98.1 F (36.7 C)     RR 18-20 oxygen saturation 100% HR 90 BP 161/70 Initial blood sugar on arrival 63 patient received OraGel with blood glucose now up to 119 Lactic acid 5.19 EtOH level 103  VBG 7.021/36.6/184  Na 135 K 5.7 Bicarb 7 Glucose 48 AST 200 total bili1.7 Alb 5.0 WBC 11.4  Hg 15.7  CT abd hepatic steatosis; Right  Lower lobe lung base ? Aspiration   Following Medications were ordered in ER: Medications  iopamidol (ISOVUE-300) 61 % injection (not administered)  sodium chloride 0.9 % injection (not administered)  dextrose (GLUTOSE) 40 % oral gel 37.5 g (not administered)  sodium chloride 0.9 % 1,000 mL with thiamine 123XX123 mg, folic acid 1 mg, multivitamins adult 10 mL infusion (not administered)  sodium  chloride 0.9 % bolus 2,000 mL (2,000 mLs Intravenous New Bag/Given 08/20/16 2157)  ketorolac (TORADOL) 30 MG/ML injection 30 mg (30 mg Intravenous Given 08/20/16 2158)  ondansetron (ZOFRAN) injection 4 mg (4 mg Intravenous Given 08/20/16 2158)  dicyclomine (BENTYL) injection 20 mg (20 mg Intramuscular Given 08/20/16 2202)  promethazine (PHENERGAN) injection 12.5 mg (12.5 mg Intravenous Given 08/20/16 2208)  dextrose 50 % solution 50 mL (50 mLs Intravenous Given 08/20/16 2200)  famotidine (PEPCID) IVPB 20 mg premix (0 mg Intravenous Stopped 08/20/16 2235)  iopamidol (ISOVUE-300) 61 % injection 100 mL (100 mLs Intravenous Contrast Given 08/20/16 2232)  HYDROmorphone (DILAUDID) injection 1 mg (1 mg Intravenous Given 08/20/16 2315)      Hospitalist was called for admission for Severe lactic acidosis dehydration and intractable nausea vomiting setting of alcohol withdrawal with questionable aspiration pneumonia  Review of Systems:    Pertinent positives include:  abdominal pain, nausea, vomiting,  Constitutional:  No weight loss, night sweats, Fevers, chills, fatigue, weight loss  HEENT:  No headaches, Difficulty swallowing,Tooth/dental problems,Sore throat,  No sneezing, itching, ear ache, nasal congestion, post nasal drip,  Cardio-vascular:  No chest pain, Orthopnea, PND, anasarca, dizziness, palpitations.no Bilateral lower extremity swelling  GI:  No heartburn, indigestion, diarrhea, change in bowel habits, loss of appetite, melena, blood in stool, hematemesis Resp:  no shortness of breath at rest. No dyspnea on exertion, No excess mucus, no productive cough, No non-productive cough, No coughing up of blood.No change in color of mucus.No wheezing. Skin:  no rash or lesions. No jaundice GU:  no dysuria, change in color of urine, no urgency or frequency. No straining to urinate.  No flank pain.  Musculoskeletal:  No joint pain or no joint swelling. No decreased range of motion. No back  pain.  Psych:  No change in mood or affect. No depression or anxiety. No memory loss.  Neuro: no localizing neurological complaints, no tingling, no weakness, no double vision, no gait abnormality, no slurred speech, no confusion  As per HPI otherwise 10 point review of systems negative.   Past Medical History: Past Medical History:  Diagnosis Date  . Acid reflux   . Colon cancer ([place])   . Colon cancer ([place])   . PUD (peptic ulcer disease)    Past Surgical History:  Procedure Laterality Date  . COLON RESECTION       Social History:  Ambulatory   independently      reports that he has been smoking.  He has been smoking about 1.00 pack per day. He has never used smokeless tobacco. He reports that he drinks alcohol. He reports that he does not use drugs.  Allergies:   Allergies  Allergen Reactions  . Aspirin Other (See Comments)    Acid reflux   . Penicillins Hives    Has patient had a PCN reaction causing immediate rash, facial/tongue/throat swelling, SOB or lightheadedness with hypotension: yes Has patient had a PCN reaction causing severe rash involving mucus membranes or skin necrosis: no Has patient had a PCN reaction that required hospitalization: no Has patient had a PCN reaction occurring within the last 10 years: no If all of the above answers are "NO", then may proceed with Cephalosporin use.        Family History:   Family History  Problem Relation Age of Onset  . Colon cancer Father   . Cancer Sister   . CAD Neg Hx   . Stroke Neg Hx   . Diabetes Neg Hx     Medications: Prior to Admission medications   Medication Sig Start Date End Date Taking? Authorizing Provider  esomeprazole (NEXIUM) 40 MG capsule Take 40 mg by mouth daily at 12 noon.   Yes Historical Provider, MD    Physical Exam: Patient Vitals for the past 24 hrs:  BP Temp Temp src Pulse Resp SpO2 Height Weight  08/20/16 2306 161/70 - - 90 18 100 % - -  08/20/16 2200 150/80 - - 89 18 100  % - -  08/20/16 1840 - - - - - - 6' (1.829 m) 65.8 kg (145 lb)  08/20/16 1835 153/78 98.1 F (36.7 C) Oral 86 20 100 % - -    1. General:  in No Acute distress 2. Psychological: Alert and   Oriented 3. Head/ENT:     Dry Mucous Membranes                          Head Non traumatic, neck supple                           Poor Dentition 4. SKIN:   decreased Skin turgor,  Skin clean Dry and intact no rash 5. Heart: Regular rate and rhythm no Murmur, Rub or gallop 6. Lungs:  no wheezes some occasional crackles   7. Abdomen: Soft,  tender, Non distended 8. Lower extremities: no clubbing, cyanosis, or edema 9. Neurologically Grossly intact, moving all  4 extremities equally tremulous 10. MSK: Normal range of motion   body mass index is 19.67 kg/m.  Labs on Admission:   Labs on Admission: I have personally reviewed following labs and imaging studies  CBC:  Recent Labs Lab 08/20/16 1920  WBC 11.4*  HGB 15.7  HCT 46.0  MCV 100.2*  PLT 123XX123   Basic Metabolic Panel:  Recent Labs Lab 08/20/16 1920  NA 135  K 5.7*  CL 102  CO2 7*  GLUCOSE 48*  BUN 13  CREATININE 1.09  CALCIUM 9.4   GFR: Estimated Creatinine Clearance: 77.1 mL/min (by C-G formula based on SCr of 1.09 mg/dL). Liver Function Tests:  Recent Labs Lab 08/20/16 1920  AST 200*  ALT 53  ALKPHOS 79  BILITOT 1.7*  PROT 9.0*  ALBUMIN 5.0    Recent Labs Lab 08/20/16 1920  LIPASE 38   No results for input(s): AMMONIA in the last 168 hours. Coagulation Profile: No results for input(s): INR, PROTIME in the last 168 hours. Cardiac Enzymes: No results for input(s): CKTOTAL, CKMB, CKMBINDEX, TROPONINI in the last 168 hours. BNP (last 3 results) No results for input(s): PROBNP in the last 8760 hours. HbA1C: No results for input(s): HGBA1C in the last 72 hours. CBG:  Recent Labs Lab 08/20/16 2141 08/20/16 2305  GLUCAP 63* 119*   Lipid Profile: No results for input(s): CHOL, HDL, LDLCALC, TRIG,  CHOLHDL, LDLDIRECT in the last 72 hours. Thyroid Function Tests: No results for input(s): TSH, T4TOTAL, FREET4, T3FREE, THYROIDAB in the last 72 hours. Anemia Panel: No results for input(s): VITAMINB12, FOLATE, FERRITIN, TIBC, IRON, RETICCTPCT in the last 72 hours. Urine analysis:    Component Value Date/Time   COLORURINE YELLOW 08/20/2016 2310   APPEARANCEUR CLEAR 08/20/2016 2310   LABSPEC 1.030 08/20/2016 2310   PHURINE 5.5 08/20/2016 2310   GLUCOSEU 500 (A) 08/20/2016 2310   HGBUR SMALL (A) 08/20/2016 2310   BILIRUBINUR NEGATIVE 08/20/2016 2310   KETONESUR >80 (A) 08/20/2016 2310   PROTEINUR 30 (A) 08/20/2016 2310   UROBILINOGEN 0.2 01/13/2009 1011   NITRITE NEGATIVE 08/20/2016 2310   LEUKOCYTESUR NEGATIVE 08/20/2016 2310   Sepsis Labs: @LABRCNTIP (procalcitonin:4,lacticidven:4) )No results found for this or any previous visit (from the past 240 hour(s)).     UA  no evidence of UTI    No results found for: HGBA1C  Estimated Creatinine Clearance: 77.1 mL/min (by C-G formula based on SCr of 1.09 mg/dL).  BNP (last 3 results) No results for input(s): PROBNP in the last 8760 hours.   ECG REPORT ordered  Filed Weights   08/20/16 1840  Weight: 65.8 kg (145 lb)     Cultures: No results found for: SDES, SPECREQUEST, CULT, REPTSTATUS   Radiological Exams on Admission: Ct Abdomen Pelvis W Contrast  Result Date: 08/20/2016 CLINICAL DATA:  Sudden onset of emesis, 2 days ago. Abdominal pain and diaphoresis. EXAM: CT ABDOMEN AND PELVIS WITH CONTRAST TECHNIQUE: Multidetector CT imaging of the abdomen and pelvis was performed using the standard protocol following bolus administration of intravenous contrast. CONTRAST:  154mL ISOVUE-300 IOPAMIDOL (ISOVUE-300) INJECTION 61% COMPARISON:  02/04/2013 FINDINGS: Lower chest: Right lower lobe opacities, tree-in-bud distribution. This could represent aspiration. Pneumonia not excluded. Hepatobiliary: Diffuse fatty infiltration of the  liver without significant focal lesion. Gallbladder and bile ducts are unremarkable. Pancreas: Unremarkable. No pancreatic ductal dilatation or surrounding inflammatory changes. Spleen: Normal in size without focal abnormality. Adrenals/Urinary Tract: Adrenal glands are unremarkable. Kidneys are normal, without renal calculi, focal lesion, or hydronephrosis. Bladder is  unremarkable. Stomach/Bowel: Stomach, small bowel and colon are unremarkable. No bowel obstruction. No bowel inflammation. No extraluminal air. Vascular/Lymphatic: The abdominal aorta is normal in caliber with mild atherosclerotic calcification. No pathologic adenopathy is evident in the abdomen or pelvis. Reproductive: Unremarkable Other: No ascites. Musculoskeletal: No acute or significant osseous findings. IMPRESSION: 1. Hepatic steatosis without focal lesion. 2. Right lower lobe lung base opacities, suggesting aspiration or pneumonia. Electronically Signed   By: Andreas Newport M.D.   On: 08/20/2016 23:01    Chart has been reviewed    Assessment/Plan   50 y.o. male with medical history significant of alcohol abuse and remote history of colon cancer, PUD admitted with Severe lactic acidosis dehydration and intractable nausea vomiting setting of alcohol withdrawal with questionable aspiration pneumonia   Present on Admission: . Alcohol withdrawal ([place]) CIWA protocol monitor for severe withdrawal  . Dehydration town dehydration will rehydrate and monitor . Intractable nausea and vomiting likely secondary to gastroenteritis given sick contacts CT scan of abdomen showing no evidence of infection . Lactic acidosis/starvation ketoacidosis severe we'll admit to step down appreciate be CCM consult/electronic monitoring will order nutritional consult need to monitor electrolytes closely. Avoid refeeding syndrome. Make sure patient is on thiamine folic acid. Make sure to monitor closely phosphate and magnesium level . Aspiration pneumonia  ([place]) in a setting of alcohol abuse we will obtain chest x-ray. Cover with Levaquin . Sepsis ([place]) versus dehydration admit to step down follow serial lactic acid   Other plan as per orders.  DVT prophylaxis:    Lovenox     Code Status:  FULL CODE  as per patient    Family Communication:   Family not  at  Bedside   Friend at bedside  Disposition Plan:     To home once workup is complete and patient is stable                              Consults called: PCCM  Admission status:   inpatient       Level of care    SDU      I have spent a total of 66 min on this admission  extra time was spent to discuss case with PCCM  Daniels 08/20/2016, 12:26 AM   [facility]  Pager [phone]   after 2 AM please page floor coverage PA If 7AM-7PM, please contact the day team taking care of the patient  [email or web address]  Password [email or web address]

## 2016-08-20 NOTE — ED Notes (Signed)
Notified EDP,LU,MD., pt. I-stat CG4 Lactic acid 5.19 and RN,Rick made aware.

## 2016-08-21 ENCOUNTER — Inpatient Hospital Stay (HOSPITAL_COMMUNITY): Payer: Self-pay

## 2016-08-21 DIAGNOSIS — K760 Fatty (change of) liver, not elsewhere classified: Secondary | ICD-10-CM

## 2016-08-21 DIAGNOSIS — D696 Thrombocytopenia, unspecified: Secondary | ICD-10-CM

## 2016-08-21 DIAGNOSIS — F1010 Alcohol abuse, uncomplicated: Secondary | ICD-10-CM

## 2016-08-21 HISTORY — DX: Fatty (change of) liver, not elsewhere classified: K76.0

## 2016-08-21 HISTORY — DX: Thrombocytopenia, unspecified: D69.6

## 2016-08-21 LAB — RETICULOCYTES
RBC.: 3.78 MIL/uL — AB (ref 4.22–5.81)
RETIC COUNT ABSOLUTE: 45.4 10*3/uL (ref 19.0–186.0)
Retic Ct Pct: 1.2 % (ref 0.4–3.1)

## 2016-08-21 LAB — COMPREHENSIVE METABOLIC PANEL
ALK PHOS: 49 U/L (ref 38–126)
ALT: 37 U/L (ref 17–63)
ANION GAP: 10 (ref 5–15)
AST: 88 U/L — ABNORMAL HIGH (ref 15–41)
Albumin: 3.7 g/dL (ref 3.5–5.0)
BUN: 9 mg/dL (ref 6–20)
CALCIUM: 7.9 mg/dL — AB (ref 8.9–10.3)
CO2: 17 mmol/L — AB (ref 22–32)
Chloride: 104 mmol/L (ref 101–111)
Creatinine, Ser: 0.84 mg/dL (ref 0.61–1.24)
GFR calc non Af Amer: >60 mL/min (ref >60)
Glucose, Bld: 69 mg/dL (ref 65–99)
POTASSIUM: 4.5 mmol/L (ref 3.5–5.1)
SODIUM: 131 mmol/L — AB (ref 135–145)
TOTAL PROTEIN: 6.7 g/dL (ref 6.5–8.1)
Total Bilirubin: 1.6 mg/dL — ABNORMAL HIGH (ref 0.3–1.2)

## 2016-08-21 LAB — BASIC METABOLIC PANEL
ANION GAP: 13 (ref 5–15)
ANION GAP: 12 (ref 5–15)
BUN: 10 mg/dL (ref 6–20)
BUN: 8 mg/dL (ref 6–20)
CHLORIDE: 106 mmol/L (ref 101–111)
CHLORIDE: 104 mmol/L (ref 101–111)
CO2: 13 mmol/L — AB (ref 22–32)
CO2: 16 mmol/L — AB (ref 22–32)
CREATININE: 0.87 mg/dL (ref 0.61–1.24)
Calcium: 8 mg/dL — ABNORMAL LOW (ref 8.9–10.3)
Calcium: 8 mg/dL — ABNORMAL LOW (ref 8.9–10.3)
Creatinine, Ser: 0.84 mg/dL (ref 0.61–1.24)
GFR calc non Af Amer: >60 mL/min (ref >60)
GFR calc non Af Amer: >60 mL/min (ref >60)
Glucose, Bld: 89 mg/dL (ref 65–99)
Glucose, Bld: 71 mg/dL (ref 65–99)
POTASSIUM: 4.9 mmol/L (ref 3.5–5.1)
POTASSIUM: 4.1 mmol/L (ref 3.5–5.1)
SODIUM: 132 mmol/L — AB (ref 135–145)
SODIUM: 132 mmol/L — AB (ref 135–145)

## 2016-08-21 LAB — GLUCOSE, CAPILLARY
GLUCOSE-CAPILLARY: 97 mg/dL (ref 65–99)
GLUCOSE-CAPILLARY: 66 mg/dL (ref 65–99)
Glucose-Capillary: 80 mg/dL (ref 65–99)
Glucose-Capillary: 67 mg/dL (ref 65–99)
Glucose-Capillary: 95 mg/dL (ref 65–99)
Glucose-Capillary: 87 mg/dL (ref 65–99)
Glucose-Capillary: 131 mg/dL — ABNORMAL HIGH (ref 65–99)
Glucose-Capillary: 101 mg/dL — ABNORMAL HIGH (ref 65–99)

## 2016-08-21 LAB — EXPECTORATED SPUTUM ASSESSMENT W REFEX TO RESP CULTURE: SPECIAL REQUESTS: NORMAL

## 2016-08-21 LAB — MAGNESIUM
MAGNESIUM: 1.8 mg/dL (ref 1.7–2.4)
MAGNESIUM: 1.9 mg/dL (ref 1.7–2.4)

## 2016-08-21 LAB — PROTIME-INR
INR: 1.11
PROTHROMBIN TIME: 14.4 s (ref 11.4–15.2)

## 2016-08-21 LAB — PHOSPHORUS
PHOSPHORUS: 2.1 mg/dL — AB (ref 2.5–4.6)
Phosphorus: 2.6 mg/dL (ref 2.5–4.6)

## 2016-08-21 LAB — EXPECTORATED SPUTUM ASSESSMENT W GRAM STAIN, RFLX TO RESP C: Special Requests: NORMAL

## 2016-08-21 LAB — IRON AND TIBC
Iron: 147 ug/dL (ref 45–182)
Saturation Ratios: 44 % — ABNORMAL HIGH (ref 17.9–39.5)
TIBC: 337 ug/dL (ref 250–450)
UIBC: 190 ug/dL

## 2016-08-21 LAB — PREALBUMIN: PREALBUMIN: 18.2 mg/dL (ref 18–38)

## 2016-08-21 LAB — PROCALCITONIN: PROCALCITONIN: 0.26 ng/mL

## 2016-08-21 LAB — I-STAT CG4 LACTIC ACID, ED: LACTIC ACID, VENOUS: 3.09 mmol/L — AB (ref 0.5–1.9)

## 2016-08-21 LAB — LACTIC ACID, PLASMA
Lactic Acid, Venous: 1.8 mmol/L (ref 0.5–1.9)
Lactic Acid, Venous: 0.9 mmol/L (ref 0.5–1.9)

## 2016-08-21 LAB — MRSA PCR SCREENING: MRSA BY PCR: NEGATIVE

## 2016-08-21 LAB — CBC
HEMATOCRIT: 35.4 % — AB (ref 39.0–52.0)
HEMOGLOBIN: 12.2 g/dL — AB (ref 13.0–17.0)
MCH: 34.1 pg — AB (ref 26.0–34.0)
MCHC: 34.5 g/dL (ref 30.0–36.0)
MCV: 98.9 fL (ref 78.0–100.0)
Platelets: 104 10*3/uL — ABNORMAL LOW (ref 150–400)
RBC: 3.58 MIL/uL — AB (ref 4.22–5.81)
RDW: 13.1 % (ref 11.5–15.5)
WBC: 9 10*3/uL (ref 4.0–10.5)

## 2016-08-21 LAB — FOLATE: FOLATE: 86.1 ng/mL (ref >5.9)

## 2016-08-21 LAB — FIBRINOGEN: FIBRINOGEN: 228 mg/dL (ref 210–475)

## 2016-08-21 LAB — HIV ANTIBODY (ROUTINE TESTING W REFLEX): HIV Screen 4th Generation wRfx: NONREACTIVE

## 2016-08-21 LAB — CK: CK TOTAL: 657 U/L — AB (ref 49–397)

## 2016-08-21 LAB — APTT: aPTT: 29 seconds (ref 24–36)

## 2016-08-21 LAB — STREP PNEUMONIAE URINARY ANTIGEN: Strep Pneumo Urinary Antigen: POSITIVE — AB

## 2016-08-21 LAB — FERRITIN: Ferritin: 243 ng/mL (ref 24–336)

## 2016-08-21 LAB — TSH: TSH: 1.334 u[IU]/mL (ref 0.350–4.500)

## 2016-08-21 MED ORDER — DEXTROSE-NACL 5-0.9 % IV SOLN
INTRAVENOUS | Status: DC
  Administered 2016-08-22 – 2016-08-24 (×3): via INTRAVENOUS

## 2016-08-21 MED ORDER — THIAMINE HCL 100 MG/ML IJ SOLN
100.0000 mg | Freq: Every day | INTRAMUSCULAR | Status: DC
  Administered 2016-08-21 – 2016-08-22 (×2): 100 mg via INTRAVENOUS
  Filled 2016-08-21 (×2): qty 2

## 2016-08-21 MED ORDER — FAMOTIDINE 20 MG PO TABS
20.0000 mg | ORAL_TABLET | Freq: Two times a day (BID) | ORAL | Status: DC
  Administered 2016-08-21 – 2016-08-22 (×4): 20 mg via ORAL
  Filled 2016-08-21 (×4): qty 1

## 2016-08-21 MED ORDER — FOLIC ACID 5 MG/ML IJ SOLN
1.0000 mg | Freq: Every day | INTRAMUSCULAR | Status: DC
  Administered 2016-08-21 – 2016-08-24 (×3): 1 mg via INTRAVENOUS
  Filled 2016-08-21 (×5): qty 0.2

## 2016-08-21 MED ORDER — DEXTROSE-NACL 5-0.9 % IV SOLN
INTRAVENOUS | Status: DC
  Administered 2016-08-21 – 2016-08-27 (×6): via INTRAVENOUS

## 2016-08-21 MED ORDER — ACETAMINOPHEN 650 MG RE SUPP
650.0000 mg | Freq: Four times a day (QID) | RECTAL | Status: DC | PRN
Start: 2016-08-21 — End: 2016-08-27

## 2016-08-21 MED ORDER — ONDANSETRON HCL 4 MG/2ML IJ SOLN: 4.0000 mg | Freq: Four times a day (QID) | INTRAMUSCULAR | Status: DC | PRN

## 2016-08-21 MED ORDER — SODIUM CHLORIDE 0.9 % IV SOLN
INTRAVENOUS | Status: DC
  Administered 2016-08-21: 02:00:00 via INTRAVENOUS

## 2016-08-21 MED ORDER — LORAZEPAM 2 MG/ML IJ SOLN
2.0000 mg | INTRAMUSCULAR | Status: DC | PRN
  Administered 2016-08-21 (×2): 2 mg via INTRAVENOUS
  Administered 2016-08-21: 3 mg via INTRAVENOUS
  Administered 2016-08-22 (×3): 2 mg via INTRAVENOUS
  Administered 2016-08-23: 3 mg via INTRAVENOUS
  Administered 2016-08-23 (×2): 2 mg via INTRAVENOUS
  Administered 2016-08-23 (×2): 3 mg via INTRAVENOUS
  Administered 2016-08-23: 2 mg via INTRAVENOUS
  Filled 2016-08-21 (×2): qty 1
  Filled 2016-08-21: qty 2
  Filled 2016-08-21: qty 1
  Filled 2016-08-21: qty 2
  Filled 2016-08-21: qty 1
  Filled 2016-08-21: qty 2
  Filled 2016-08-21: qty 1
  Filled 2016-08-21: qty 2
  Filled 2016-08-21 (×3): qty 1

## 2016-08-21 MED ORDER — SODIUM CHLORIDE 0.9% FLUSH
3.0000 mL | Freq: Two times a day (BID) | INTRAVENOUS | Status: DC
  Administered 2016-08-21 – 2016-08-25 (×8): 3 mL via INTRAVENOUS

## 2016-08-21 MED ORDER — LEVOFLOXACIN IN D5W 750 MG/150ML IV SOLN
750.0000 mg | INTRAVENOUS | Status: DC
  Administered 2016-08-22 – 2016-08-24 (×3): 750 mg via INTRAVENOUS
  Filled 2016-08-21 (×3): qty 150

## 2016-08-21 MED ORDER — BOOST / RESOURCE BREEZE PO LIQD
1.0000 | Freq: Three times a day (TID) | ORAL | Status: DC
  Administered 2016-08-21 – 2016-08-22 (×5): 1 via ORAL

## 2016-08-21 MED ORDER — ONDANSETRON HCL 4 MG PO TABS
4.0000 mg | ORAL_TABLET | Freq: Four times a day (QID) | ORAL | Status: DC | PRN
  Administered 2016-08-24: 4 mg via ORAL
  Filled 2016-08-21: qty 1

## 2016-08-21 MED ORDER — NICOTINE 21 MG/24HR TD PT24
21.0000 mg | MEDICATED_PATCH | Freq: Every day | TRANSDERMAL | Status: DC
  Administered 2016-08-21 – 2016-08-27 (×7): 21 mg via TRANSDERMAL
  Filled 2016-08-21 (×7): qty 1

## 2016-08-21 MED ORDER — VANCOMYCIN HCL IN DEXTROSE 750-5 MG/150ML-% IV SOLN
750.0000 mg | Freq: Two times a day (BID) | INTRAVENOUS | Status: DC
  Filled 2016-08-21: qty 150

## 2016-08-21 MED ORDER — ENOXAPARIN SODIUM 40 MG/0.4ML ~~LOC~~ SOLN
40.0000 mg | SUBCUTANEOUS | Status: DC
  Administered 2016-08-21 – 2016-08-27 (×7): 40 mg via SUBCUTANEOUS
  Filled 2016-08-21 (×7): qty 0.4

## 2016-08-21 MED ORDER — ACETAMINOPHEN 325 MG PO TABS
650.0000 mg | ORAL_TABLET | Freq: Four times a day (QID) | ORAL | Status: DC | PRN
  Administered 2016-08-21 (×2): 650 mg via ORAL
  Administered 2016-08-21: 325 mg via ORAL
  Administered 2016-08-24: 650 mg via ORAL
  Filled 2016-08-21 (×4): qty 2

## 2016-08-21 NOTE — Progress Notes (Signed)
Sean from lab called and reported that the folate level of 28.7 was incorrect the actual level is 86.1. Paged the Dr. On call to let them know.

## 2016-08-21 NOTE — Progress Notes (Signed)
Patient received from ICU/Stepdown to room 1521. Oriented patient to room and unit. Agree with ICU RN's shift assessment. Call bell within reach. Patient's visitors at bedside.

## 2016-08-21 NOTE — Progress Notes (Signed)
PROGRESS NOTE    Richard [name]  [id number] DOB: 05-Jul-1966 DOA: 08/20/2016  PCP: Pcp Not In System   Brief Narrative:  Richard [name] is a 50 y.o. male with medical history significant of alcohol abuse and remote history of colon cancer, PUD presented with 2 day history of abdominal pain, nausea and vomiting. Reports sick contacts of somebody had similar presentation.Patient denies taking over-the-counter medications for his symptoms.. Patient drinks at least 40 ounces beers a day sometimes up to a pint- last alcoholic drink was Thursday when he had 3 shots of liquor.   Subjective: Having abdominal pain 5/10 in mid abdomen, achy, no nausea or vomiting today. Cough with dark sputum today. No dyspnea.   Assessment & Plan:   Principal Problem:   Aspiration pneumonia  - RLL infiltrate, yellow sputum - on Levaquin- d/c Vanc as MRSA negative - not hypoxic    Active Problems:    Alcohol abuse with acute alcohol intoxication - ETOH level 103 on admission - follow with CIWA scale for alcohol withdrawal      Dehydration/ hyponatremia - cont IVF    Intractable nausea and vomiting/ abdominal pain - improving- PRN Zofran, clear liquids, Pepcid BID - pain is likely soreness from vomiting- Lipase normal-Tylenol PRN, K pad    Lactic acidosis/ starvation acidosis - pH 7.021 on admission with CO2 of 7 which has improved to 16 - LA 5.19 >> 0.9  Mild hypoglycemia - starting clear liquid diet, add D5 to fluids- not symptomatic from it    Hepatic steatosis - due to ETOH abuse    Thrombocytopenia  - ? Due to infection vs chronic ETOH abuse  Anemia - check anemia panel  DVT prophylaxis: Lovenox Code Status: Full code Family Communication: spoke with his "baby mama" at bedside Disposition Plan: home when stable Consultants:    Procedures:    Antimicrobials:  Anti-infectives    Start     Dose/Rate Route Frequency Ordered Stop   08/22/16 0100  levofloxacin (LEVAQUIN) IVPB 750  mg     750 mg 100 mL/hr over 90 Minutes Intravenous Every 24 hours 08/21/16 0022     08/21/16 1400  vancomycin (VANCOCIN) IVPB 750 mg/150 ml premix  Status:  Discontinued     750 mg 150 mL/hr over 60 Minutes Intravenous Every 12 hours 08/21/16 0024 08/21/16 0731   08/20/16 2359  levofloxacin (LEVAQUIN) IVPB 750 mg     750 mg 100 mL/hr over 90 Minutes Intravenous  Once 08/20/16 2340 08/21/16 0222   08/20/16 2359  vancomycin (VANCOCIN) IVPB 1000 mg/200 mL premix     1,000 mg 200 mL/hr over 60 Minutes Intravenous  Once 08/20/16 2340 08/21/16 0251       Objective: Vitals:   08/21/16 0500 08/21/16 0600 08/21/16 0700 08/21/16 0800  BP:  (!) 145/76  (!) 163/91  Pulse: 74 75 77 69  Resp: 16 19 14 18   Temp:  98.3 F (36.8 C)    TempSrc:  Oral    SpO2: 98% 99% 99% 99%  Weight:      Height:        Intake/Output Summary (Last 24 hours) at 08/21/16 0958 Last data filed at 08/21/16 0824  Gross per 24 hour  Intake          2941.67 ml  Output              960 ml  Net          1981.67 ml   [employer]  08/20/16 1840  Weight: 65.8 kg (145 lb)    Examination: General exam: Appears comfortable  HEENT: PERRLA, oral mucosa moist, no sclera icterus or thrush Respiratory system: coarse breath sounds/ crackles in LLL. Respiratory effort normal. Cardiovascular system: S1 & S2 heard, RRR.  No murmurs  Gastrointestinal system: Abdomen soft, mildly tender in epigastrium and mid abdomen, nondistended. Normal bowel sound. No organomegaly Central nervous system: Alert and oriented. No focal neurological deficits. Extremities: No cyanosis, clubbing or edema Skin: No rashes or ulcers Psychiatry:  Mood & affect appropriate.     Data Reviewed: I have personally reviewed following labs and imaging studies  CBC:  Recent Labs Lab 08/20/16 1920 08/21/16 0512  WBC 11.4* 9.0  HGB 15.7 12.2*  HCT 46.0 35.4*  MCV 100.2* 98.9  PLT 185 123456*   Basic Metabolic Panel:  Recent Labs Lab  08/20/16 1920 08/21/16 0026 08/21/16 0147 08/21/16 0512 08/21/16 0800  NA 135  --  132* 131* 132*  K 5.7*  --  4.9 4.5 4.1  CL 102  --  106 104 104  CO2 7*  --  13* 17* 16*  GLUCOSE 48*  --  89 69 71  BUN 13  --  10 9 8   CREATININE 1.09  --  0.84 0.84 0.87  CALCIUM 9.4  --  8.0* 7.9* 8.0*  MG  --  1.9  --  1.8  --   PHOS  --  2.6  --  2.1*  --    GFR: Estimated Creatinine Clearance: 96.6 mL/min (by C-G formula based on SCr of 0.87 mg/dL). Liver Function Tests:  Recent Labs Lab 08/20/16 1920 08/21/16 0512  AST 200* 88*  ALT 53 37  ALKPHOS 79 49  BILITOT 1.7* 1.6*  PROT 9.0* 6.7  ALBUMIN 5.0 3.7    Recent Labs Lab 08/20/16 1920  LIPASE 38   No results for input(s): AMMONIA in the last 168 hours. Coagulation Profile:  Recent Labs Lab 08/21/16 0026  INR 1.11   Cardiac Enzymes:  Recent Labs Lab 08/21/16 0026  CKTOTAL 657*   BNP (last 3 results) No results for input(s): PROBNP in the last 8760 hours. HbA1C: No results for input(s): HGBA1C in the last 72 hours. CBG:  Recent Labs Lab 08/20/16 2305 08/21/16 0303 08/21/16 0416 08/21/16 0616 08/21/16 0742  GLUCAP 119* 97 80 66 67   Lipid Profile: No results for input(s): CHOL, HDL, LDLCALC, TRIG, CHOLHDL, LDLDIRECT in the last 72 hours. Thyroid Function Tests:  Recent Labs  08/21/16 0512  TSH 1.334   Anemia Panel: No results for input(s): VITAMINB12, FOLATE, FERRITIN, TIBC, IRON, RETICCTPCT in the last 72 hours. Urine analysis:    Component Value Date/Time   COLORURINE YELLOW 08/20/2016 2310   APPEARANCEUR CLEAR 08/20/2016 2310   LABSPEC 1.030 08/20/2016 2310   PHURINE 5.5 08/20/2016 2310   GLUCOSEU 500 (A) 08/20/2016 2310   HGBUR SMALL (A) 08/20/2016 2310   BILIRUBINUR NEGATIVE 08/20/2016 2310   KETONESUR >80 (A) 08/20/2016 2310   PROTEINUR 30 (A) 08/20/2016 2310   UROBILINOGEN 0.2 01/13/2009 1011   NITRITE NEGATIVE 08/20/2016 2310   LEUKOCYTESUR NEGATIVE 08/20/2016 2310   Sepsis  Labs: @LABRCNTIP (procalcitonin:4,lacticidven:4) ) Recent Results (from the past 240 hour(s))  MRSA PCR Screening     Status: None   Collection Time: 08/21/16  1:00 AM  Result Value Ref Range Status   MRSA by PCR NEGATIVE NEGATIVE Final    Comment:        The GeneXpert MRSA Assay (FDA  approved for NASAL specimens only), is one component of a comprehensive MRSA colonization surveillance program. It is not intended to diagnose MRSA infection nor to guide or monitor treatment for MRSA infections.   Culture, sputum-assessment     Status: None   Collection Time: 08/21/16  2:46 AM  Result Value Ref Range Status   Specimen Description SPUTUM  Final   Special Requests Normal  Final   Sputum evaluation   Final    Sputum specimen not acceptable for testing.  Please recollect.   RESULT CALLED TO, READ BACK BY AND VERIFIED WITH: R.[email or web address],RN [id number] 08/21/16 W.SHEA    Report Status 08/21/2016 FINAL  Final  Culture, expectorated sputum-assessment     Status: None   Collection Time: 08/21/16  8:07 AM  Result Value Ref Range Status   Specimen Description SPUTUM  Final   Special Requests Normal  Final   Sputum evaluation   Final    THIS SPECIMEN IS ACCEPTABLE. RESPIRATORY CULTURE REPORT TO FOLLOW.   Report Status 08/21/2016 FINAL  Final         Radiology Studies: Dg Chest 2 View  Result Date: 08/21/2016 CLINICAL DATA:  Acute onset of generalized abdominal pain and nausea. Vomiting. Initial encounter. EXAM: CHEST  2 VIEW COMPARISON:  Chest radiograph performed 10/23/2013 FINDINGS: The lungs are well-aerated. Mild vascular congestion is noted. Mild right basilar airspace opacity may reflect pneumonia or mild interstitial edema. There is no evidence of pleural effusion or pneumothorax. The heart is normal in size; the mediastinal contour is within normal limits. No acute osseous abnormalities are seen. IMPRESSION: Mild vascular congestion. Mild right basilar airspace opacity may reflect  pneumonia or mild interstitial edema. Electronically Signed   By: Garald Balding M.D.   On: 08/21/2016 00:50   Ct Abdomen Pelvis W Contrast  Result Date: 08/20/2016 CLINICAL DATA:  Sudden onset of emesis, 2 days ago. Abdominal pain and diaphoresis. EXAM: CT ABDOMEN AND PELVIS WITH CONTRAST TECHNIQUE: Multidetector CT imaging of the abdomen and pelvis was performed using the standard protocol following bolus administration of intravenous contrast. CONTRAST:  131mL ISOVUE-300 IOPAMIDOL (ISOVUE-300) INJECTION 61% COMPARISON:  02/04/2013 FINDINGS: Lower chest: Right lower lobe opacities, tree-in-bud distribution. This could represent aspiration. Pneumonia not excluded. Hepatobiliary: Diffuse fatty infiltration of the liver without significant focal lesion. Gallbladder and bile ducts are unremarkable. Pancreas: Unremarkable. No pancreatic ductal dilatation or surrounding inflammatory changes. Spleen: Normal in size without focal abnormality. Adrenals/Urinary Tract: Adrenal glands are unremarkable. Kidneys are normal, without renal calculi, focal lesion, or hydronephrosis. Bladder is unremarkable. Stomach/Bowel: Stomach, small bowel and colon are unremarkable. No bowel obstruction. No bowel inflammation. No extraluminal air. Vascular/Lymphatic: The abdominal aorta is normal in caliber with mild atherosclerotic calcification. No pathologic adenopathy is evident in the abdomen or pelvis. Reproductive: Unremarkable Other: No ascites. Musculoskeletal: No acute or significant osseous findings. IMPRESSION: 1. Hepatic steatosis without focal lesion. 2. Right lower lobe lung base opacities, suggesting aspiration or pneumonia. Electronically Signed   By: Andreas Newport M.D.   On: 08/20/2016 23:01      Scheduled Meds: . dextrose  1 Tube Oral Once  . enoxaparin (LOVENOX) injection  40 mg Subcutaneous Q24H  . folic acid  1 mg Intravenous Daily  . iopamidol      . [START ON 08/22/2016] levofloxacin (LEVAQUIN) IV  750  mg Intravenous Q24H  . nicotine  21 mg Transdermal Daily  . sodium chloride  1,000 mL Intravenous Once   And  . sodium chloride  1,000 mL Intravenous  Once  . sodium chloride      . sodium chloride flush  3 mL Intravenous Q12H  . thiamine  100 mg Intravenous Daily   Continuous Infusions: . sodium chloride Stopped (08/21/16 0824)  . dextrose 5 % and 0.9% NaCl 100 mL/hr at 08/21/16 0823     LOS: 0 days    Time spent in minutes: 46    [place], MD [facility] Pager: [email or web address] Password [email or web address] 08/21/2016, 9:58 AM

## 2016-08-21 NOTE — Progress Notes (Signed)
Pharmacy Antibiotic Note  Richard [name] is a 50 y.o. male admitted on 08/20/2016 with pneumonia.  Patient ordered Vancomycin 1gm and Levaquin 750mg  IV x 1 dose each in the ED.  PMH significant for colon cancer and presents with complaints of abdominal pain and vomiting.  CTAngio suggests PNA. Pharmacy has been consulted for Vancomycin & Levaquin dosing.  Plan:  Vancomycin 750mg  IV q12h  Vancomycin trough goal: 15-20 mcg/ml  Levaquin 750mg  IV q24h  F/U cultures/sensitivities  Height: 6' (182.9 cm) Weight: 145 lb (65.8 kg) IBW/kg (Calculated) : 77.6  Temp (24hrs), Avg:98.1 F (36.7 C), Min:98.1 F (36.7 C), Max:98.1 F (36.7 C)   Recent Labs Lab 08/20/16 1920 08/20/16 2228  WBC 11.4*  --   CREATININE 1.09  --   LATICACIDVEN  --  5.19*    Estimated Creatinine Clearance: 77.1 mL/min (by C-G formula based on SCr of 1.09 mg/dL).    Allergies  Allergen Reactions  . Aspirin Other (See Comments)    Acid reflux   . Penicillins Hives    Has patient had a PCN reaction causing immediate rash, facial/tongue/throat swelling, SOB or lightheadedness with hypotension: yes Has patient had a PCN reaction causing severe rash involving mucus membranes or skin necrosis: no Has patient had a PCN reaction that required hospitalization: no Has patient had a PCN reaction occurring within the last 10 years: no If all of the above answers are "NO", then may proceed with Cephalosporin use.     Antimicrobials this admission: 11/19 vanc >>   11/19 levaquin >>    Dose adjustments this admission:    Microbiology results: 11/19 BCx: sent  Thank you for allowing pharmacy to be a part of this patient's care.  Everette Rank, PharmD 08/21/2016 12:17 AM

## 2016-08-22 LAB — GLUCOSE, CAPILLARY
GLUCOSE-CAPILLARY: 108 mg/dL — AB (ref 65–99)
GLUCOSE-CAPILLARY: 118 mg/dL — AB (ref 65–99)

## 2016-08-22 LAB — HEPATITIS PANEL, ACUTE
HEP B S AG: NEGATIVE
Hep A IgM: NEGATIVE
Hep B C IgM: NEGATIVE

## 2016-08-22 LAB — CBC
HCT: 39.6 % (ref 39.0–52.0)
Hemoglobin: 14 g/dL (ref 13.0–17.0)
MCH: 34.1 pg — ABNORMAL HIGH (ref 26.0–34.0)
MCHC: 35.4 g/dL (ref 30.0–36.0)
MCV: 96.4 fL (ref 78.0–100.0)
PLATELETS: 111 10*3/uL — AB (ref 150–400)
RBC: 4.11 MIL/uL — AB (ref 4.22–5.81)
RDW: 12.7 % (ref 11.5–15.5)
WBC: 3.6 10*3/uL — AB (ref 4.0–10.5)

## 2016-08-22 LAB — VITAMIN B12: VITAMIN B 12: 336 pg/mL (ref 180–914)

## 2016-08-22 MED ORDER — ADULT MULTIVITAMIN W/MINERALS CH
1.0000 | ORAL_TABLET | Freq: Every day | ORAL | Status: DC
  Administered 2016-08-22 – 2016-08-27 (×4): 1 via ORAL
  Filled 2016-08-22 (×4): qty 1

## 2016-08-22 MED ORDER — POTASSIUM CHLORIDE CRYS ER 20 MEQ PO TBCR
40.0000 meq | EXTENDED_RELEASE_TABLET | ORAL | Status: AC
  Administered 2016-08-22: 40 meq via ORAL
  Filled 2016-08-22 (×2): qty 2

## 2016-08-22 NOTE — Progress Notes (Signed)
08/22/16 1715  Paged MD twice to notify her that patient's mother is in the room and would like to speak with her in regards to his care. Still waiting for MD to response.

## 2016-08-22 NOTE — Progress Notes (Signed)
08/22/16  Reviewed AMA form with patient. Patient signed AMA form.

## 2016-08-22 NOTE — Care Management Note (Signed)
Case Management Note  Patient Details  Name: Richard [name] MRN: [id number] Date of Birth: October 27, 1965  Subjective/Objective: 50 y/o m admitted w/abd pain. Hx: etoh. IVC-Psych CSW following.                   Action/Plan:d/c plan IP Psych.   Expected Discharge Date:                  Expected Discharge Plan:  [facility]  In-House Referral:  Clinical Social Work  Discharge planning Services  CM Consult  Post Acute Care Choice:    Choice offered to:     DME Arranged:    DME Agency:     HH Arranged:    [place] Agency:     Status of Service:  In process, will continue to follow  If discussed at Long Length of Stay Meetings, dates discussed:    Additional Comments:  Dessa Phi, RN 08/22/2016, 11:23 AM

## 2016-08-22 NOTE — Progress Notes (Signed)
08/22/16  Paged MD. Patient states he is going to take everything off and leave. Waiting response.

## 2016-08-22 NOTE — Progress Notes (Addendum)
PROGRESS NOTE    Richard [name]  [id number] DOB: May 18, 1966 DOA: 08/20/2016  PCP: Pcp Not In System   Brief Narrative:  Richard [name] is a 50 y.o. male with medical history significant of alcohol abuse and remote history of colon cancer, PUD presented with 2 day history of abdominal pain, nausea and vomiting. Reports sick contact with somebody who had a similar presentation. Patient denies taking over-the-counter medications for his symptoms.. Patient drinks at least 40 ounces beers a day sometimes up to a pint- last alcoholic drink was Thursday when he had 3 shots of liquor.   Subjective: No complaints today. Wants to leave the hospital.   Assessment & Plan:   Principal Problem:   Aspiration pneumonia  - RLL infiltrate, yellow sputum - on Levaquin- d/c Vanc as MRSA negative - not hypoxic    Active Problems:    Alcohol abuse with acute alcohol intoxication - ETOH level 103 on admission - following with CIWA scale for alcohol withdrawal  - has required 5 mg Ativan overnight - trying to leave the hospital - appears restless- have done IVC paperwork- cont ETOH withdrawal protocol    Dehydration/ hyponatremia - cont IVF    Intractable nausea and vomiting/ abdominal pain - improving- PRN Zofran, Pepcid BID - pain is likely soreness from vomiting- Lipase normal-Tylenol PRN, K pad - pain improved today - advance diet as tolerated    Lactic acidosis/ starvation acidosis - pH 7.021 on admission with CO2 of 7 which has improved to 16 - LA 5.19 >> 0.9  Mild hypoglycemia - starting clear liquid diet, added D5 to fluids- not symptomatic from it    Hepatic steatosis - due to ETOH abuse    Thrombocytopenia  - ? Due to infection vs chronic ETOH abuse  Leukopenia - mild- WBC 3.6- follow  Anemia -  anemia panel wnl- Hb upto 14 today- follow  Smoker - nicotine patch  DVT prophylaxis: Lovenox Code Status: Full code Family Communication: spoke with his "baby mama" at  bedside Disposition Plan: home when stable Consultants:    Procedures:    Antimicrobials:  Anti-infectives    Start     Dose/Rate Route Frequency Ordered Stop   08/22/16 0100  levofloxacin (LEVAQUIN) IVPB 750 mg     750 mg 100 mL/hr over 90 Minutes Intravenous Every 24 hours 08/21/16 0022     08/21/16 1400  vancomycin (VANCOCIN) IVPB 750 mg/150 ml premix  Status:  Discontinued     750 mg 150 mL/hr over 60 Minutes Intravenous Every 12 hours 08/21/16 0024 08/21/16 0731   08/20/16 2359  levofloxacin (LEVAQUIN) IVPB 750 mg     750 mg 100 mL/hr over 90 Minutes Intravenous  Once 08/20/16 2340 08/21/16 0222   08/20/16 2359  vancomycin (VANCOCIN) IVPB 1000 mg/200 mL premix     1,000 mg 200 mL/hr over 60 Minutes Intravenous  Once 08/20/16 2340 08/21/16 0251       Objective: Vitals:   08/21/16 2030 08/22/16 0410 08/22/16 0831 08/22/16 1226  BP: (!) 156/89 (!) 137/94 (!) 166/111 (!) 161/110  Pulse: 80 73 78 90  Resp: 17 17 20 20   Temp: 98.6 F (37 C) 98.2 F (36.8 C)    TempSrc: Oral Oral    SpO2: 98% 99% 98% 99%  Weight:      Height:        Intake/Output Summary (Last 24 hours) at 08/22/16 1350 Last data filed at 08/22/16 0800  Gross per 24 hour  Intake  1380 ml  Output             1002 ml  Net              378 ml   Filed Weights   08/20/16 1840 08/21/16 1529  Weight: 65.8 kg (145 lb) 62.6 kg (138 lb 0.1 oz)    Examination: General exam: Appears comfortable  HEENT: PERRLA, oral mucosa moist, no sclera icterus or thrush Respiratory system: coarse breath sounds/ crackles in LLL. Respiratory effort normal. Cardiovascular system: S1 & S2 heard, RRR.  No murmurs  Gastrointestinal system: Abdomen soft, non tender today, nondistended. Normal bowel sound. No organomegaly Central nervous system: Alert and oriented. No focal neurological deficits. Extremities: No cyanosis, clubbing or edema Skin: No rashes or ulcers Psychiatry:  Mood & affect appropriate.      Data Reviewed: I have personally reviewed following labs and imaging studies  CBC:  Recent Labs Lab 08/20/16 1920 08/21/16 0512 08/22/16 0411  WBC 11.4* 9.0 3.6*  HGB 15.7 12.2* 14.0  HCT 46.0 35.4* 39.6  MCV 100.2* 98.9 96.4  PLT 185 104* [id number]*   Basic Metabolic Panel:  Recent Labs Lab 08/20/16 1920 08/21/16 0026 08/21/16 0147 08/21/16 0512 08/21/16 0800 08/22/16 0411  NA 135  --  132* 131* 132* 134*  K 5.7*  --  4.9 4.5 4.1 3.4*  CL 102  --  106 104 104 99*  CO2 7*  --  13* 17* 16* 26  GLUCOSE 48*  --  89 69 71 114*  BUN 13  --  10 9 8  <5*  CREATININE 1.09  --  0.84 0.84 0.87 0.63  CALCIUM 9.4  --  8.0* 7.9* 8.0* 9.4  MG  --  1.9  --  1.8  --   --   PHOS  --  2.6  --  2.1*  --   --    GFR: Estimated Creatinine Clearance: 100 mL/min (by C-G formula based on SCr of 0.63 mg/dL). Liver Function Tests:  Recent Labs Lab 08/20/16 1920 08/21/16 0512  AST 200* 88*  ALT 53 37  ALKPHOS 79 49  BILITOT 1.7* 1.6*  PROT 9.0* 6.7  ALBUMIN 5.0 3.7    Recent Labs Lab 08/20/16 1920  LIPASE 38   No results for input(s): AMMONIA in the last 168 hours. Coagulation Profile:  Recent Labs Lab 08/21/16 0026  INR 1.11   Cardiac Enzymes:  Recent Labs Lab 08/21/16 0026  CKTOTAL 657*   BNP (last 3 results) No results for input(s): PROBNP in the last 8760 hours. HbA1C: No results for input(s): HGBA1C in the last 72 hours. CBG:  Recent Labs Lab 08/21/16 1632 08/21/16 2024 08/21/16 2243 08/22/16 0047 08/22/16 0258  GLUCAP 87 131* 101* 108* 118*   Lipid Profile: No results for input(s): CHOL, HDL, LDLCALC, TRIG, CHOLHDL, LDLDIRECT in the last 72 hours. Thyroid Function Tests:  Recent Labs  08/21/16 0512  TSH 1.334   Anemia Panel:  Recent Labs  08/21/16 1248 08/21/16 1251 08/22/16 0411  VITAMINB12  --   --  336  FOLATE 86.1  --   --   FERRITIN  --  243  --   TIBC  --  337  --   IRON  --  147  --   RETICCTPCT  --  1.2  --    Urine  analysis:    Component Value Date/Time   COLORURINE YELLOW 08/20/2016 [place] 08/20/2016 2310   LABSPEC 1.030 08/20/2016 2310  PHURINE 5.5 08/20/2016 2310   GLUCOSEU 500 (A) 08/20/2016 2310   HGBUR SMALL (A) 08/20/2016 2310   BILIRUBINUR NEGATIVE 08/20/2016 2310   KETONESUR >80 (A) 08/20/2016 2310   PROTEINUR 30 (A) 08/20/2016 2310   UROBILINOGEN 0.2 01/13/2009 1011   NITRITE NEGATIVE 08/20/2016 2310   LEUKOCYTESUR NEGATIVE 08/20/2016 2310   Sepsis Labs: @LABRCNTIP (procalcitonin:4,lacticidven:4) ) Recent Results (from the past 240 hour(s))  Culture, blood (x 2)     Status: None (Preliminary result)   Collection Time: 08/21/16 12:21 AM  Result Value Ref Range Status   Specimen Description BLOOD BLOOD RIGHT FOREARM  Final   Special Requests BOTTLES DRAWN AEROBIC AND ANAEROBIC 5CC EACH  Final   Culture   Final    NO GROWTH 1 DAY Performed at [facility]    Report Status PENDING  Incomplete  MRSA PCR Screening     Status: None   Collection Time: 08/21/16  1:00 AM  Result Value Ref Range Status   MRSA by PCR NEGATIVE NEGATIVE Final    Comment:        The GeneXpert MRSA Assay (FDA approved for NASAL specimens only), is one component of a comprehensive MRSA colonization surveillance program. It is not intended to diagnose MRSA infection nor to guide or monitor treatment for MRSA infections.   Culture, blood (x 2)     Status: None (Preliminary result)   Collection Time: 08/21/16  1:47 AM  Result Value Ref Range Status   Specimen Description BLOOD LEFT ANTECUBITAL  Final   Special Requests IN PEDIATRIC BOTTLE 1.5CC  Final   Culture   Final    NO GROWTH 1 DAY Performed at [facility]    Report Status PENDING  Incomplete  Culture, sputum-assessment     Status: None   Collection Time: 08/21/16  2:46 AM  Result Value Ref Range Status   Specimen Description SPUTUM  Final   Special Requests Normal  Final   Sputum evaluation   Final     Sputum specimen not acceptable for testing.  Please recollect.   RESULT CALLED TO, READ BACK BY AND VERIFIED WITH: R.MCINTOSH,RN 0320 08/21/16 W.SHEA    Report Status 08/21/2016 FINAL  Final  Culture, expectorated sputum-assessment     Status: None   Collection Time: 08/21/16  8:07 AM  Result Value Ref Range Status   Specimen Description SPUTUM  Final   Special Requests Normal  Final   Sputum evaluation   Final    THIS SPECIMEN IS ACCEPTABLE. RESPIRATORY CULTURE REPORT TO FOLLOW.   Report Status 08/21/2016 FINAL  Final  Culture, respiratory (NON-Expectorated)     Status: None (Preliminary result)   Collection Time: 08/21/16  8:07 AM  Result Value Ref Range Status   Specimen Description SPUTUM  Final   Special Requests NONE  Final   Gram Stain   Final    NO WBC SEEN FEW SQUAMOUS EPITHELIAL CELLS PRESENT FEW GRAM POSITIVE RODS FEW GRAM POSITIVE COCCI IN PAIRS IN CLUSTERS FEW GRAM NEGATIVE RODS    Culture   Final    CULTURE REINCUBATED FOR BETTER GROWTH Performed at [facility]    Report Status PENDING  Incomplete         Radiology Studies: Dg Chest 2 View  Result Date: 08/21/2016 CLINICAL DATA:  Acute onset of generalized abdominal pain and nausea. Vomiting. Initial encounter. EXAM: CHEST  2 VIEW COMPARISON:  Chest radiograph performed 10/23/2013 FINDINGS: The lungs are well-aerated. Mild vascular congestion is noted. Mild right  basilar airspace opacity may reflect pneumonia or mild interstitial edema. There is no evidence of pleural effusion or pneumothorax. The heart is normal in size; the mediastinal contour is within normal limits. No acute osseous abnormalities are seen. IMPRESSION: Mild vascular congestion. Mild right basilar airspace opacity may reflect pneumonia or mild interstitial edema. Electronically Signed   By: Garald Balding M.D.   On: 08/21/2016 00:50   Ct Abdomen Pelvis W Contrast  Result Date: 08/20/2016 CLINICAL DATA:  Sudden onset of emesis, 2  days ago. Abdominal pain and diaphoresis. EXAM: CT ABDOMEN AND PELVIS WITH CONTRAST TECHNIQUE: Multidetector CT imaging of the abdomen and pelvis was performed using the standard protocol following bolus administration of intravenous contrast. CONTRAST:  159mL ISOVUE-300 IOPAMIDOL (ISOVUE-300) INJECTION 61% COMPARISON:  02/04/2013 FINDINGS: Lower chest: Right lower lobe opacities, tree-in-bud distribution. This could represent aspiration. Pneumonia not excluded. Hepatobiliary: Diffuse fatty infiltration of the liver without significant focal lesion. Gallbladder and bile ducts are unremarkable. Pancreas: Unremarkable. No pancreatic ductal dilatation or surrounding inflammatory changes. Spleen: Normal in size without focal abnormality. Adrenals/Urinary Tract: Adrenal glands are unremarkable. Kidneys are normal, without renal calculi, focal lesion, or hydronephrosis. Bladder is unremarkable. Stomach/Bowel: Stomach, small bowel and colon are unremarkable. No bowel obstruction. No bowel inflammation. No extraluminal air. Vascular/Lymphatic: The abdominal aorta is normal in caliber with mild atherosclerotic calcification. No pathologic adenopathy is evident in the abdomen or pelvis. Reproductive: Unremarkable Other: No ascites. Musculoskeletal: No acute or significant osseous findings. IMPRESSION: 1. Hepatic steatosis without focal lesion. 2. Right lower lobe lung base opacities, suggesting aspiration or pneumonia. Electronically Signed   By: Andreas Newport M.D.   On: 08/20/2016 23:01      Scheduled Meds: . dextrose  1 Tube Oral Once  . enoxaparin (LOVENOX) injection  40 mg Subcutaneous Q24H  . famotidine  20 mg Oral BID  . feeding supplement  1 Container Oral TID BM  . folic acid  1 mg Intravenous Daily  . levofloxacin (LEVAQUIN) IV  750 mg Intravenous Q24H  . nicotine  21 mg Transdermal Daily  . sodium chloride  1,000 mL Intravenous Once   And  . sodium chloride  1,000 mL Intravenous Once  . sodium  chloride flush  3 mL Intravenous Q12H  . thiamine  100 mg Intravenous Daily   Continuous Infusions: . dextrose 5 % and 0.9% NaCl 100 mL/hr at 08/22/16 0625  . dextrose 5 % and 0.9% NaCl 100 mL/hr at 08/22/16 0939     LOS: 1 day    Time spent in minutes: 73    Rheems, MD [facility] Pager: [email or web address] Password [email or web address] 08/22/2016, 1:50 PM

## 2016-08-22 NOTE — Progress Notes (Signed)
IVC paperwork completed. Faxed to [employer]. [employer] to serve patient.  Kathrin Greathouse, Latanya Presser, MSW Clinical Social Worker 5E and Psychiatric Service Line [phone] 08/22/2016  9:28 AM

## 2016-08-22 NOTE — Progress Notes (Signed)
Initial Nutrition Assessment  DOCUMENTATION CODES:   Not applicable  INTERVENTION:  Advance diet per MD.  Continue Boost Breeze po TID, each supplement provides 250 kcal and 9 grams of protein.  Encouraged adequate intake of calories and protein through meals, snacks, and beverages. Reviewed protein foods patient will enjoy (chicken, beans, milk) and encouraged their intake with each meal.   Ordered multivitamin with minerals daily. Continue folic acid 1 mg daily. Also recommend thiamine 100 mg daily in setting of alcohol abuse.  NUTRITION DIAGNOSIS:   Unintentional weight loss related to chronic illness (remote hx of colon cancer) as evidenced by 24 percent weight loss over 1 year per patient report.  GOAL:   Patient will meet greater than or equal to 90% of their needs  MONITOR:   PO intake, Supplement acceptance, Labs, Weight trends, I & O's  REASON FOR ASSESSMENT:   Malnutrition Screening Tool, Consult Assessment of nutrition requirement/status  ASSESSMENT:   50 y.o.malewith medical history significant of alcohol abuseand remote history of colon cancer, PUD, presented with 2 day history of abdominal pain, nausea and vomiting. Patient drinks at least 40 ounces beers a day sometimes up to a pint- last alcoholic drink was Thursday when he had 3 shots of liquor. Found to have aspiration PNA, alcohol intoxication.   -Patient was attempting to leave AMA earlier today, IVC paperwork completed and served to patient.  Spoke with patient at bedside and sitter was present. He reports his appetite is good and he eats 3 meals per day of a variety of foods. Did not provide any further details regarding intake upon probing except that he enjoys chicken and breakfast food. He reports he has been losing weight slowly this past year and his UBW was 182 lbs. This is a weight loss of 44 lbs (24% body weight) over 1 year, which is significant for time frame.  Patient reports he enjoys [employer] and is amenable to continuing to drink these during admission.  Meal Completion: 60-100% of CLD/FLD per chart.  Medications reviewed and include: famotidine, folic acid 1 mg daily, potassium chloride 40 mEq Q4hrs, D5-NS @ 100 ml/hr (120 grams dextrose, 408 kcal daily).  Labs reviewed: CBG 87-131 past 24 hrs, Vitamin B12 WNL.  Unable to complete Nutrition-Focused physical exam at this time as patient was in his clothes already and had been attempting to leave AMA.   Could not collect enough data to diagnose patient with malnutrition as he did not provide many details on intake and could not complete the NFPE.   Diet Order:  Diet full liquid Room service appropriate? Yes; Fluid consistency: Thin  Skin:  Reviewed, no issues  Last BM:  08/20/2016  Height:   Ht Readings from Last 1 Encounters:  08/21/16 6' (1.829 m)    Weight:   Wt Readings from Last 1 Encounters:  08/21/16 138 lb 0.1 oz (62.6 kg)    Ideal Body Weight:  80.9 kg  BMI:  Body mass index is 18.72 kg/m.  Estimated Nutritional Needs:   Kcal:  1850-2150 (MSJ x 1.2-1.4)  Protein:  80-95 grams (1.3-1.5 grams/kg)  Fluid:  >/= 1.8 L/day (30 ml/kg)  EDUCATION NEEDS:   Education needs addressed (Adequate intake of calories and protein.)  Willey Blade, MS, RD, LDN Pager: [phone] After Hours Pager: [phone]

## 2016-08-22 NOTE — Progress Notes (Signed)
08/22/16  Notified MD of patient elevated BP. Waiting for response.

## 2016-08-23 DIAGNOSIS — E872 Acidosis: Secondary | ICD-10-CM

## 2016-08-23 DIAGNOSIS — D696 Thrombocytopenia, unspecified: Secondary | ICD-10-CM

## 2016-08-23 DIAGNOSIS — J690 Pneumonitis due to inhalation of food and vomit: Secondary | ICD-10-CM

## 2016-08-23 DIAGNOSIS — F1010 Alcohol abuse, uncomplicated: Secondary | ICD-10-CM

## 2016-08-23 DIAGNOSIS — F10231 Alcohol dependence with withdrawal delirium: Principal | ICD-10-CM

## 2016-08-23 LAB — BASIC METABOLIC PANEL
ANION GAP: 8 (ref 5–15)
Anion gap: 9 (ref 5–15)
BUN: <5 mg/dL — ABNORMAL LOW (ref 6–20)
CALCIUM: 9.4 mg/dL (ref 8.9–10.3)
CHLORIDE: 99 mmol/L — AB (ref 101–111)
CHLORIDE: 101 mmol/L (ref 101–111)
CO2: 26 mmol/L (ref 22–32)
CO2: 26 mmol/L (ref 22–32)
CREATININE: 0.63 mg/dL (ref 0.61–1.24)
CREATININE: 0.63 mg/dL (ref 0.61–1.24)
Calcium: 10.3 mg/dL (ref 8.9–10.3)
GFR calc non Af Amer: >60 mL/min (ref >60)
Glucose, Bld: 114 mg/dL — ABNORMAL HIGH (ref 65–99)
Glucose, Bld: 104 mg/dL — ABNORMAL HIGH (ref 65–99)
POTASSIUM: 4.5 mmol/L (ref 3.5–5.1)
Potassium: 3.4 mmol/L — ABNORMAL LOW (ref 3.5–5.1)
SODIUM: 134 mmol/L — AB (ref 135–145)
SODIUM: 135 mmol/L (ref 135–145)

## 2016-08-23 LAB — CULTURE, RESPIRATORY W GRAM STAIN

## 2016-08-23 LAB — CBC
HEMATOCRIT: 41.7 % (ref 39.0–52.0)
Hemoglobin: 14.3 g/dL (ref 13.0–17.0)
MCH: 33.2 pg (ref 26.0–34.0)
MCHC: 34.3 g/dL (ref 30.0–36.0)
MCV: 96.8 fL (ref 78.0–100.0)
Platelets: 119 10*3/uL — ABNORMAL LOW (ref 150–400)
RBC: 4.31 MIL/uL (ref 4.22–5.81)
RDW: 12.5 % (ref 11.5–15.5)
WBC: 3.6 10*3/uL — AB (ref 4.0–10.5)

## 2016-08-23 LAB — MRSA PCR SCREENING: MRSA BY PCR: NEGATIVE

## 2016-08-23 LAB — CULTURE, RESPIRATORY
CULTURE: NORMAL
GRAM STAIN: NONE SEEN

## 2016-08-23 MED ORDER — DIAZEPAM 5 MG/ML IJ SOLN
2.5000 mg | Freq: Two times a day (BID) | INTRAMUSCULAR | Status: DC
  Administered 2016-08-23 – 2016-08-24 (×3): 2.5 mg via INTRAVENOUS
  Filled 2016-08-23 (×3): qty 2

## 2016-08-23 MED ORDER — SODIUM CHLORIDE 0.9 % IV SOLN
500.0000 mg | Freq: Three times a day (TID) | INTRAVENOUS | Status: AC
  Administered 2016-08-23 – 2016-08-26 (×9): 500 mg via INTRAVENOUS
  Filled 2016-08-23 (×2): qty 5
  Filled 2016-08-23: qty 4
  Filled 2016-08-23: qty 2
  Filled 2016-08-23 (×4): qty 5
  Filled 2016-08-23: qty 3

## 2016-08-23 MED ORDER — DEXMEDETOMIDINE HCL IN NACL 200 MCG/50ML IV SOLN
0.4000 ug/kg/h | INTRAVENOUS | Status: DC
  Administered 2016-08-23: 1 ug/kg/h via INTRAVENOUS
  Administered 2016-08-23 – 2016-08-24 (×6): 1.2 ug/kg/h via INTRAVENOUS
  Filled 2016-08-23 (×8): qty 50

## 2016-08-23 MED ORDER — HYDRALAZINE HCL 20 MG/ML IJ SOLN
10.0000 mg | INTRAMUSCULAR | Status: DC | PRN
  Administered 2016-08-23: 10 mg via INTRAVENOUS
  Administered 2016-08-24 – 2016-08-25 (×2): 20 mg via INTRAVENOUS
  Filled 2016-08-23 (×3): qty 1

## 2016-08-23 MED ORDER — FAMOTIDINE IN NACL 20-0.9 MG/50ML-% IV SOLN
20.0000 mg | Freq: Two times a day (BID) | INTRAVENOUS | Status: DC
  Administered 2016-08-23 – 2016-08-24 (×3): 20 mg via INTRAVENOUS
  Filled 2016-08-23 (×3): qty 50

## 2016-08-23 NOTE — Progress Notes (Signed)
Pt was given at total of 11 mg of ativan overnight. Pt placed on 4-point restraints. Pt pulled out 3 IV's overnight. MD notified about pt status.

## 2016-08-23 NOTE — Consult Note (Signed)
PULMONARY / CRITICAL CARE MEDICINE   Name: Richard [name] MRN: [id number] DOB: 1966-06-23    ADMISSION DATE:  08/20/2016 CONSULTATION DATE:  08/23/2016  REFERRING MD:  Debbe Odea, M.D. / [facility]  CHIEF COMPLAINT:  Altered Mental Status  HISTORY OF PRESENT ILLNESS:  50 y.o. male with known history of chronic alcohol abuse and history of colon cancer status post resection. Patient presented with a 2 day history of abdominal pain, nausea, and vomiting. Patient does have sick contacts per documentation. Patient's last reported alcoholic beverage was on Thursday prior to admission but his alcohol level was elevated on admission. Per documentation patient's abdominal discomfort improved on twice daily Pepcid. He was noted to have significant acidosis and elevated lactic acid on admission. A nicotine patch was also placed for chronic tobacco use. Patient placed on CIWA protocol and received a total of 21 mg of Ativan since yesterday morning. Due to patient's ongoing combative nature and altered mental status he was transferred to the ICU for further treatment and we were consulted.  PAST MEDICAL HISTORY :  Past Medical History:  Diagnosis Date  . Acid reflux   . Colon cancer ([place])   . Colon cancer ([place])   . PUD (peptic ulcer disease)     PAST SURGICAL HISTORY: Past Surgical History:  Procedure Laterality Date  . COLON RESECTION       Allergies  Allergen Reactions  . Aspirin Other (See Comments)    Acid reflux   . Penicillins Hives    Has patient had a PCN reaction causing immediate rash, facial/tongue/throat swelling, SOB or lightheadedness with hypotension: yes Has patient had a PCN reaction causing severe rash involving mucus membranes or skin necrosis: no Has patient had a PCN reaction that required hospitalization: no Has patient had a PCN reaction occurring within the last 10 years: no If all of the above answers are "NO", then may proceed with Cephalosporin use.     No current  facility-administered medications on file prior to encounter.    Current Outpatient Prescriptions on File Prior to Encounter  Medication Sig  . esomeprazole (NEXIUM) 40 MG capsule Take 40 mg by mouth daily at 12 noon.    FAMILY HISTORY:  Family History  Problem Relation Age of Onset  . Colon cancer Father   . Cancer Sister   . CAD Neg Hx   . Stroke Neg Hx   . Diabetes Neg Hx     SOCIAL HISTORY: Social History   Social History  . Marital status: Legally Separated    Spouse name: N/A  . Number of children: N/A  . Years of education: N/A   Social History Main Topics  . Smoking status: Current Every Day Smoker    Packs/day: 1.00  . Smokeless tobacco: Never Used  . Alcohol use Yes     Comment: heavy alcohol abuse a beer and couple of shots a day for past  14 years  . Drug use: No  . Sexual activity: Not Asked   Other Topics Concern  . None   Social History Narrative  . None    REVIEW OF SYSTEMS:  Unable to obtain given altered mental status.  SUBJECTIVE: As above.  VITAL SIGNS: BP (!) 164/84 (BP Location: Right Arm)   Pulse 93   Temp 97.5 F (36.4 C) (Axillary)   Resp 18   Ht 6' (1.829 m)   Wt 138 lb 0.1 oz (62.6 kg)   SpO2 93%   BMI 18.72 kg/m  HEMODYNAMICS:    VENTILATOR SETTINGS:    INTAKE / OUTPUT: I/O last 3 completed shifts: In: [id number] [P.O.:480; I.V.:6685; IV Piggyback:300] Out: 1202 [Urine:1202]  PHYSICAL EXAMINATION: General:  Awake. Alert. No acute distress. Sitting watching TV. Family at bedside.  Integument:  Warm & dry. No rash on exposed skin. No bruising. Lymphatics:  No appreciated cervical or supraclavicular lymphadenoapthy. HEENT:  Moist mucus membranes. No oral ulcers. No scleral injection or icterus. Endotracheal tube in place. PERRL. Cardiovascular:  Regular rate. No edema. No appreciable JVD.  Pulmonary:  Good aeration & clear to auscultation bilaterally. Symmetric chest wall expansion. No accessory muscle use. Abdomen: Soft.  Normal bowel sounds. Nondistended. Grossly nontender. Musculoskeletal:  Normal bulk and tone. Hand grip strength 5/5 bilaterally. No joint deformity or effusion appreciated. Neurological:  CN 2-12 grossly in tact. No meningismus. Moving all 4 extremities equally. Symmetric brachioradialis deep tendon reflexes. Psychiatric:  Mood and affect congruent. Speech normal rhythm, rate & tone.   LABS:  BMET  Recent Labs Lab 08/21/16 0800 08/22/16 0411 08/23/16 0511  NA 132* 134* 135  K 4.1 3.4* 4.5  CL 104 99* 101  CO2 16* 26 26  BUN 8 <5* <5*  CREATININE 0.87 0.63 0.63  GLUCOSE 71 114* 104*    Electrolytes  Recent Labs Lab 08/21/16 0026  08/21/16 0512 08/21/16 0800 08/22/16 0411 08/23/16 0511  CALCIUM  --   < > 7.9* 8.0* 9.4 10.3  MG 1.9  --  1.8  --   --   --   PHOS 2.6  --  2.1*  --   --   --   < > = values in this interval not displayed.  CBC  Recent Labs Lab 08/21/16 0512 08/22/16 0411 08/23/16 0511  WBC 9.0 3.6* 3.6*  HGB 12.2* 14.0 14.3  HCT 35.4* 39.6 41.7  PLT 104* 111* 119*    Coag's  Recent Labs Lab 08/21/16 0026  APTT 29  INR 1.11    Sepsis Markers  Recent Labs Lab 08/21/16 0026 08/21/16 0031 08/21/16 0147 08/21/16 0512  LATICACIDVEN  --  3.09* 1.8 0.9  PROCALCITON 0.26  --   --   --     ABG No results for input(s): PHART, PCO2ART, PO2ART in the last 168 hours.  Liver Enzymes  Recent Labs Lab 08/20/16 1920 08/21/16 0512  AST 200* 88*  ALT 53 37  ALKPHOS 79 49  BILITOT 1.7* 1.6*  ALBUMIN 5.0 3.7    Cardiac Enzymes No results for input(s): TROPONINI, PROBNP in the last 168 hours.  Glucose  Recent Labs Lab 08/21/16 1136 08/21/16 1632 08/21/16 2024 08/21/16 2243 08/22/16 0047 08/22/16 0258  GLUCAP 95 87 131* 101* 108* 118*    Imaging No results found.   STUDIES:  CT Abd/Pelvis 11/18: IMPRESSION: 1. Hepatic steatosis without focal lesion. 2. Right lower lobe lung base opacities, suggesting aspiration or  pneumonia. CXR PA/LAT 11/19:  Personally reviewed by me. No pleural effusion. Hazy right basilar opacity. Heart normal in size & mediastinum normal in contour.  MICROBIOLOGY: Hepatitis A IgM:  Negative Hepatitis B Surface Ag:  Negative Hepatitis B Core Ab:  Negative Hepatitis C Ab:  <0.1 HIV:  Nonreactive  MRSA PCR:  Negative Blood Ctx x2 11/19 >> Sputum Ctx 11/19 >>  ANTIBIOTICS: Vancomycin 11/19 - 11/20 Levaquin 11/18 >>  SIGNIFICANT EVENTS: 11/18 - Admit  LINES/TUBES: PIV  DISCUSSION:  50 y.o. male with chronic alcohol and tobacco use. Patient with worsening mentation likely secondary to hepatic encephalopathy. Starting  Precedex infusion and scheduling IV Valium twice daily. Plan to uptitrate dose of thiamine for treatment of possible Wernicke's encephalopathy. Continuing close monitoring. Given potential worsening of his mental status and possibly requiring endotracheal intubation for airway protection we will monitor him closely in the intensive care unit.  ASSESSMENT / PLAN:  NEUROLOGIC A:   Acute Encephalopathy - Multifactorial. EtOH Abuse/Use THC Use  P:   RASS goal: 0 Starting Thiamine 500mg  IV q8hr x 3 days Continuing Folic Acid 1mg  IV daily Starting Precedex gtt Starting Valium 2.5mg  IV q12hr Discontinuing CIWA Protocol Sitter at bedside ordered  PULMONARY A: Aspiration Pneumonia vs Pneumonitis Right Lower Lobe Tobacco Use Disorder  P:   Continuous pulse oximetry Nicotine Patch 21mg /24hr  CARDIOVASCULAR A:  Hypertension  P:  Continuous telemetry monitoring Vitals per unit protocol  RENAL A:   Hypokalemia - Resolved. AGMA - Resolved. Likely due to starvation. Lactic Acidosis - Resolved.  P:   Trending renal function & electrolytes daily Replacing electrolytes as indicated  GASTROINTESTINAL A:   Elevated AST - Likely due to EtOH use. Hepatic Steatosis - Seen on CT imaging.  H/O GERD H/O PUD  P:   NPO for now pending improvement in  mental status Monitoring LFTs intermittently Continuing Pepcid IV q12hr  HEMATOLOGIC/ONCOLOGIC A:   Thrombocytopenia - Likely splenic sequestration. Mild. H/O Colon Cancer - S/P Resection.  P:  Trending cell counts daily w/ CBC Lovenox [place] daily SCDs  INFECTIOUS A:   Possible Aspiration Pneumonia.  P:   Empiric Levaquin Day #4/7 Awaiting culture results Plan to re-culture for fever  ENDOCRINE A:   Risk for Hypoglycemia    P:   Monitoring glucose on daily labs  FAMILY  - Updates: No family at bedside. Family updated by nurse via phone.  - Inter-disciplinary family meet or Palliative Care meeting due by:  11/28.  I have spent a total of 36 minutes of critical care time today caring for the patient and reviewing the patient's electronic medical record.  Sonia Baller Ashok Cordia, M.D. [facility] Pulmonary & Critical Care Pager:  [phone] After 3pm or if no response, call [phone] 08/23/2016, 9:32 AM

## 2016-08-23 NOTE — Progress Notes (Signed)
[facility] Progress Note Patient Name: BATES DENNINGTON DOB: 1966-08-30 MRN: [id number]   Date of Service  08/23/2016  HPI/Events of Note  Elevated BP while resting comfortably, asleep  eICU Interventions  Hydralazine prn     Intervention Category Major Interventions: Hypertension - evaluation and management  Simonne Maffucci 08/23/2016, 11:27 PM

## 2016-08-23 NOTE — Progress Notes (Addendum)
PROGRESS NOTE    Richard [name]  [id number] DOB: July 06, 1966 DOA: 08/20/2016  PCP: Pcp Not In System   Brief Narrative:  Richard [name] is a 50 y.o. male with medical history significant of alcohol abuse and remote history of colon cancer, PUD presented with 2 day history of abdominal pain, nausea and vomiting. Reports sick contact with somebody who had a similar presentation. Patient denies taking over-the-counter medications for his symptoms.. Patient drinks at least 40 ounces beers a day sometimes up to a pint- last alcoholic drink was Thursday when he had 3 shots of liquor. Has had significant withdrawal symptoms and is now on a Precedex infusion per PCCM.   Subjective: Very agitated this AM. Had to be put in restraints.   Assessment & Plan:   Principal Problem:   Aspiration pneumonia  - RLL infiltrate, yellow sputum - on Levaquin- would give 5 days- d/c'd Vanc as MRSA negative - not hypoxic    Active Problems:    Alcohol abuse with acute alcohol intoxication - ETOH level 103 on admission - following with CIWA scale for alcohol withdrawal  - has required 21 mg Ativan in 24 hrs - requested PCCM consult - now on precedex infusion with additional Valium-  - TID Thiamine started by PCCM     Dehydration/ hyponatremia - cont IVF- Na +135 today    Intractable nausea and vomiting/ abdominal pain - improving- PRN Zofran, Pepcid BID - pain is likely soreness from vomiting- Lipase normal-Tylenol PRN, K pad - advanced diet to solids    Lactic acidosis/ starvation acidosis - pH 7.021 on admission with CO2 of 7 which has improved to 16 - LA 5.19 >> 0.9  Mild hypoglycemia - glucose was 67 on 11/19 AM- started clear liquid diet, added D5 to fluids- not symptomatic from it    Hepatic steatosis - due to ETOH abuse    Thrombocytopenia  - ? Due to infection vs chronic ETOH abuse  Leukopenia - mild- WBC 3.6- follow  Anemia - Hb 12/2 on 11/19-  anemia panel wnl- Hb upto 14     Smoker - nicotine patch  DVT prophylaxis: Lovenox Code Status: Full code Family Communication: spoke with his "baby mama" at bedside Disposition Plan: home when stable Consultants:    Procedures:    Antimicrobials:  Anti-infectives    Start     Dose/Rate Route Frequency Ordered Stop   08/22/16 0100  levofloxacin (LEVAQUIN) IVPB 750 mg     750 mg 100 mL/hr over 90 Minutes Intravenous Every 24 hours 08/21/16 0022     08/21/16 1400  vancomycin (VANCOCIN) IVPB 750 mg/150 ml premix  Status:  Discontinued     750 mg 150 mL/hr over 60 Minutes Intravenous Every 12 hours 08/21/16 0024 08/21/16 0731   08/20/16 2359  levofloxacin (LEVAQUIN) IVPB 750 mg     750 mg 100 mL/hr over 90 Minutes Intravenous  Once 08/20/16 2340 08/21/16 0222   08/20/16 2359  vancomycin (VANCOCIN) IVPB 1000 mg/200 mL premix     1,000 mg 200 mL/hr over 60 Minutes Intravenous  Once 08/20/16 2340 08/21/16 0251       Objective: Vitals:   08/23/16 0200 08/23/16 0504 08/23/16 0832 08/23/16 1000  BP:  (!) 168/102 (!) 164/84   Pulse: 100 88 93 (!) 102  Resp:  18 18 (!) 21  Temp:  97.8 F (36.6 C) 97.5 F (36.4 C)   TempSrc:  Oral Axillary   SpO2:  100% 93% 100%  Weight:  Height:        Intake/Output Summary (Last 24 hours) at 08/23/16 1339 Last data filed at 08/23/16 [id number]  Gross per 24 hour  Intake             5990 ml  Output              550 ml  Net             5440 ml   Filed Weights   08/20/16 1840 08/21/16 1529  Weight: 65.8 kg (145 lb) 62.6 kg (138 lb 0.1 oz)    Examination: General exam: Appears comfortable  HEENT: PERRLA, oral mucosa moist, no sclera icterus or thrush Respiratory system: coarse breath sounds/ crackles in LLL. Respiratory effort normal. Cardiovascular system: S1 & S2 heard, RRR.  No murmurs  Gastrointestinal system: Abdomen soft, non tender today, nondistended. Normal bowel sound. No organomegaly Central nervous system: Alert and oriented. No focal neurological  deficits. Extremities: No cyanosis, clubbing or edema Skin: No rashes or ulcers Psychiatry:  Mood & affect appropriate.     Data Reviewed: I have personally reviewed following labs and imaging studies  CBC:  Recent Labs Lab 08/20/16 1920 08/21/16 0512 08/22/16 0411 08/23/16 0511  WBC 11.4* 9.0 3.6* 3.6*  HGB 15.7 12.2* 14.0 14.3  HCT 46.0 35.4* 39.6 41.7  MCV 100.2* 98.9 96.4 96.8  PLT 185 104* 111* 123456*   Basic Metabolic Panel:  Recent Labs Lab 08/21/16 0026 08/21/16 0147 08/21/16 0512 08/21/16 0800 08/22/16 0411 08/23/16 0511  NA  --  132* 131* 132* 134* 135  K  --  4.9 4.5 4.1 3.4* 4.5  CL  --  106 104 104 99* 101  CO2  --  13* 17* 16* 26 26  GLUCOSE  --  89 69 71 114* 104*  BUN  --  10 9 8  <5* <5*  CREATININE  --  0.84 0.84 0.87 0.63 0.63  CALCIUM  --  8.0* 7.9* 8.0* 9.4 10.3  MG 1.9  --  1.8  --   --   --   PHOS 2.6  --  2.1*  --   --   --    GFR: Estimated Creatinine Clearance: 100 mL/min (by C-G formula based on SCr of 0.63 mg/dL). Liver Function Tests:  Recent Labs Lab 08/20/16 1920 08/21/16 0512  AST 200* 88*  ALT 53 37  ALKPHOS 79 49  BILITOT 1.7* 1.6*  PROT 9.0* 6.7  ALBUMIN 5.0 3.7    Recent Labs Lab 08/20/16 1920  LIPASE 38   No results for input(s): AMMONIA in the last 168 hours. Coagulation Profile:  Recent Labs Lab 08/21/16 0026  INR 1.11   Cardiac Enzymes:  Recent Labs Lab 08/21/16 0026  CKTOTAL 657*   BNP (last 3 results) No results for input(s): PROBNP in the last 8760 hours. HbA1C: No results for input(s): HGBA1C in the last 72 hours. CBG:  Recent Labs Lab 08/21/16 1632 08/21/16 2024 08/21/16 2243 08/22/16 0047 08/22/16 0258  GLUCAP 87 131* 101* 108* 118*   Lipid Profile: No results for input(s): CHOL, HDL, LDLCALC, TRIG, CHOLHDL, LDLDIRECT in the last 72 hours. Thyroid Function Tests:  Recent Labs  08/21/16 0512  TSH 1.334   Anemia Panel:  Recent Labs  08/21/16 1248 08/21/16 1251  08/22/16 0411  VITAMINB12  --   --  336  FOLATE 86.1  --   --   FERRITIN  --  243  --   TIBC  --  337  --  IRON  --  147  --   RETICCTPCT  --  1.2  --    Urine analysis:    Component Value Date/Time   COLORURINE YELLOW 08/20/2016 2310   APPEARANCEUR CLEAR 08/20/2016 2310   LABSPEC 1.030 08/20/2016 2310   PHURINE 5.5 08/20/2016 2310   GLUCOSEU 500 (A) 08/20/2016 2310   HGBUR SMALL (A) 08/20/2016 2310   BILIRUBINUR NEGATIVE 08/20/2016 2310   KETONESUR >80 (A) 08/20/2016 2310   PROTEINUR 30 (A) 08/20/2016 2310   UROBILINOGEN 0.2 01/13/2009 1011   NITRITE NEGATIVE 08/20/2016 2310   LEUKOCYTESUR NEGATIVE 08/20/2016 2310   Sepsis Labs: @LABRCNTIP (procalcitonin:4,lacticidven:4) ) Recent Results (from the past 240 hour(s))  Culture, blood (x 2)     Status: None (Preliminary result)   Collection Time: 08/21/16 12:21 AM  Result Value Ref Range Status   Specimen Description BLOOD BLOOD RIGHT FOREARM  Final   Special Requests BOTTLES DRAWN AEROBIC AND ANAEROBIC 5CC EACH  Final   Culture   Final    NO GROWTH 2 DAYS Performed at [facility]    Report Status PENDING  Incomplete  MRSA PCR Screening     Status: None   Collection Time: 08/21/16  1:00 AM  Result Value Ref Range Status   MRSA by PCR NEGATIVE NEGATIVE Final    Comment:        The GeneXpert MRSA Assay (FDA approved for NASAL specimens only), is one component of a comprehensive MRSA colonization surveillance program. It is not intended to diagnose MRSA infection nor to guide or monitor treatment for MRSA infections.   Culture, blood (x 2)     Status: None (Preliminary result)   Collection Time: 08/21/16  1:47 AM  Result Value Ref Range Status   Specimen Description BLOOD LEFT ANTECUBITAL  Final   Special Requests IN PEDIATRIC BOTTLE 1.5CC  Final   Culture   Final    NO GROWTH 2 DAYS Performed at [facility]    Report Status PENDING  Incomplete  Culture, sputum-assessment     Status: None    Collection Time: 08/21/16  2:46 AM  Result Value Ref Range Status   Specimen Description SPUTUM  Final   Special Requests Normal  Final   Sputum evaluation   Final    Sputum specimen not acceptable for testing.  Please recollect.   RESULT CALLED TO, READ BACK BY AND VERIFIED WITH: R.[email or web address],RN [id number] 08/21/16 W.SHEA    Report Status 08/21/2016 FINAL  Final  Culture, expectorated sputum-assessment     Status: None   Collection Time: 08/21/16  8:07 AM  Result Value Ref Range Status   Specimen Description SPUTUM  Final   Special Requests Normal  Final   Sputum evaluation   Final    THIS SPECIMEN IS ACCEPTABLE. RESPIRATORY CULTURE REPORT TO FOLLOW.   Report Status 08/21/2016 FINAL  Final  Culture, respiratory (NON-Expectorated)     Status: None   Collection Time: 08/21/16  8:07 AM  Result Value Ref Range Status   Specimen Description SPUTUM  Final   Special Requests NONE  Final   Gram Stain   Final    NO WBC SEEN FEW SQUAMOUS EPITHELIAL CELLS PRESENT FEW GRAM POSITIVE RODS FEW GRAM POSITIVE COCCI IN PAIRS IN CLUSTERS FEW GRAM NEGATIVE RODS    Culture   Final    Consistent with normal respiratory flora. Performed at [facility]    Report Status 08/23/2016 FINAL  Final  MRSA PCR Screening     Status:  None   Collection Time: 08/23/16  9:16 AM  Result Value Ref Range Status   MRSA by PCR NEGATIVE NEGATIVE Final    Comment:        The GeneXpert MRSA Assay (FDA approved for NASAL specimens only), is one component of a comprehensive MRSA colonization surveillance program. It is not intended to diagnose MRSA infection nor to guide or monitor treatment for MRSA infections.          Radiology Studies: No results found.    Scheduled Meds: . dextrose  1 Tube Oral Once  . diazepam  2.5 mg Intravenous Q12H  . enoxaparin (LOVENOX) injection  40 mg Subcutaneous Q24H  . famotidine (PEPCID) IV  20 mg Intravenous Q12H  . folic acid  1 mg Intravenous Daily  .  levofloxacin (LEVAQUIN) IV  750 mg Intravenous Q24H  . multivitamin with minerals  1 tablet Oral Daily  . nicotine  21 mg Transdermal Daily  . sodium chloride  1,000 mL Intravenous Once   And  . sodium chloride  1,000 mL Intravenous Once  . sodium chloride flush  3 mL Intravenous Q12H  . thiamine injection  500 mg Intravenous Q8H   Continuous Infusions: . dexmedetomidine 1.2 mcg/kg/hr (08/23/16 1226)  . dextrose 5 % and 0.9% NaCl 100 mL/hr at 08/22/16 2000  . dextrose 5 % and 0.9% NaCl 100 mL/hr at 08/23/16 1025     LOS: 2 days    Time spent in minutes: 65    [place], MD [facility] Pager: [email or web address] Password [email or web address] 08/23/2016, 1:39 PM

## 2016-08-23 NOTE — Progress Notes (Signed)
Pt transferred to 1234. Pt AOx2. Pt on 4-point restraints. Pt belongings were sent with the pt. Pt mother was updated of pt status and informed family of where pt was transferred. No questions or concerns at this time.  Dashan Chizmar W Jamilet Ambroise, RN

## 2016-08-23 NOTE — Progress Notes (Signed)
Date:  August 23, 2016 Chart reviewed for concurrent status and case management needs. Will continue to follow patient progress.  Iv Precedex due to etoh w/d. Discharge Planning: following for needs Expected discharge date: [id number] Velva Harman, BSN, [place], [place]

## 2016-08-24 LAB — BASIC METABOLIC PANEL
Anion gap: 7 (ref 5–15)
BUN: 5 mg/dL — AB (ref 6–20)
CALCIUM: 9.8 mg/dL (ref 8.9–10.3)
CO2: 21 mmol/L — ABNORMAL LOW (ref 22–32)
CREATININE: 0.7 mg/dL (ref 0.61–1.24)
Chloride: 109 mmol/L (ref 101–111)
Glucose, Bld: 124 mg/dL — ABNORMAL HIGH (ref 65–99)
Potassium: 3.6 mmol/L (ref 3.5–5.1)
SODIUM: 137 mmol/L (ref 135–145)

## 2016-08-24 LAB — CBC
HCT: 40.7 % (ref 39.0–52.0)
Hemoglobin: 14.2 g/dL (ref 13.0–17.0)
MCH: 33.6 pg (ref 26.0–34.0)
MCHC: 34.9 g/dL (ref 30.0–36.0)
MCV: 96.2 fL (ref 78.0–100.0)
PLATELETS: 126 10*3/uL — AB (ref 150–400)
RBC: 4.23 MIL/uL (ref 4.22–5.81)
RDW: 12.5 % (ref 11.5–15.5)
WBC: 3.1 10*3/uL — AB (ref 4.0–10.5)

## 2016-08-24 MED ORDER — LEVOFLOXACIN 750 MG PO TABS
750.0000 mg | ORAL_TABLET | ORAL | Status: DC
  Administered 2016-08-24 – 2016-08-26 (×3): 750 mg via ORAL
  Filled 2016-08-24 (×3): qty 1

## 2016-08-24 MED ORDER — FAMOTIDINE 20 MG PO TABS
20.0000 mg | ORAL_TABLET | Freq: Two times a day (BID) | ORAL | Status: DC
  Administered 2016-08-24 – 2016-08-25 (×2): 20 mg via ORAL
  Filled 2016-08-24 (×2): qty 1

## 2016-08-24 MED ORDER — LORAZEPAM 1 MG PO TABS
1.0000 mg | ORAL_TABLET | Freq: Four times a day (QID) | ORAL | Status: AC | PRN
  Administered 2016-08-24 – 2016-08-25 (×2): 1 mg via ORAL
  Filled 2016-08-24 (×2): qty 1

## 2016-08-24 MED ORDER — LORAZEPAM 2 MG/ML IJ SOLN: 1.0000 mg | INTRAMUSCULAR | Status: DC | PRN

## 2016-08-24 MED ORDER — FOLIC ACID 1 MG PO TABS
1.0000 mg | ORAL_TABLET | Freq: Every day | ORAL | Status: DC
  Administered 2016-08-25 – 2016-08-27 (×3): 1 mg via ORAL
  Filled 2016-08-24 (×2): qty 1

## 2016-08-24 MED ORDER — LORAZEPAM 2 MG/ML IJ SOLN
INTRAMUSCULAR | Status: AC
  Filled 2016-08-24: qty 1

## 2016-08-24 MED ORDER — CLONIDINE HCL 0.1 MG/24HR TD PTWK
0.1000 mg | MEDICATED_PATCH | TRANSDERMAL | Status: DC
  Administered 2016-08-24: 0.1 mg via TRANSDERMAL
  Filled 2016-08-24: qty 1

## 2016-08-24 MED ORDER — DILTIAZEM HCL-DEXTROSE 100-5 MG/100ML-% IV SOLN (PREMIX)
5.0000 mg/h | INTRAVENOUS | Status: DC
  Administered 2016-08-24: 5 mg/h via INTRAVENOUS
  Filled 2016-08-24: qty 100

## 2016-08-24 MED ORDER — DIAZEPAM 2 MG PO TABS
2.0000 mg | ORAL_TABLET | Freq: Two times a day (BID) | ORAL | Status: DC
  Administered 2016-08-24 – 2016-08-27 (×6): 2 mg via ORAL
  Filled 2016-08-24 (×6): qty 1

## 2016-08-24 MED ORDER — MIDAZOLAM HCL 2 MG/2ML IJ SOLN
1.0000 mg | INTRAMUSCULAR | Status: DC | PRN
  Administered 2016-08-24 – 2016-08-25 (×2): 2 mg via INTRAVENOUS
  Filled 2016-08-24 (×2): qty 2

## 2016-08-24 MED ORDER — LORAZEPAM 2 MG/ML IJ SOLN
2.0000 mg | Freq: Once | INTRAMUSCULAR | Status: AC
  Administered 2016-08-24: 2 mg via INTRAVENOUS

## 2016-08-24 MED ORDER — LORAZEPAM 2 MG/ML IJ SOLN
1.0000 mg | Freq: Four times a day (QID) | INTRAMUSCULAR | Status: AC | PRN
  Filled 2016-08-24: qty 1

## 2016-08-24 NOTE — Progress Notes (Signed)
PULMONARY / CRITICAL CARE MEDICINE   Name: Richard [name] MRN: [id number] DOB: 08-Jun-1966    ADMISSION DATE:  08/20/2016 CONSULTATION DATE:  08/23/2016  REFERRING MD:  Debbe Odea, M.D. / [facility]  CHIEF COMPLAINT:  Altered Mental Status  HISTORY OF PRESENT ILLNESS:  50 y.o. male with known history of chronic alcohol abuse and history of colon cancer status post resection. Patient presented with a 2 day history of abdominal pain, nausea, and vomiting. Patient does have sick contacts per documentation. Patient's last reported alcoholic beverage was on Thursday prior to admission but his alcohol level was elevated on admission. Per documentation patient's abdominal discomfort improved on twice daily Pepcid. He was noted to have significant acidosis and elevated lactic acid on admission. A nicotine patch was also placed for chronic tobacco use. Patient placed on CIWA protocol and received a total of 21 mg of Ativan since yesterday morning. Due to patient's ongoing combative nature and altered mental status he was transferred to the ICU for further treatment and we were consulted.  SUBJECTIVE:  No acute events overnight. Patient did have hypertension while resting comfortably & Hydralazine was ordered as needed. Weaned off her Precedex drip this morning. Patient more awake & answering questions. Denies any pain or difficulty breathing.   REVIEW OF SYSTEMS:  No fever or chills. Denies any abdominal pain or nausea.  VITAL SIGNS: BP (!) 150/85 (BP Location: Right Arm)   Pulse (!) 58   Temp 97.5 F (36.4 C) (Oral)   Resp 17   Ht 6' (1.829 m)   Wt 138 lb 0.1 oz (62.6 kg)   SpO2 100%   BMI 18.72 kg/m   HEMODYNAMICS:    VENTILATOR SETTINGS:    INTAKE / OUTPUT: I/O last 3 completed shifts: In: 9809.9 [I.V.:9359.9; IV Piggyback:450] Out: [id number] [Urine:1550]  PHYSICAL EXAMINATION: General:  Awake. No acute distress. Sitter at bedside. Integument:  Warm & dry. No rash on exposed  skin. Lymphatics:  No appreciated cervical or supraclavicular lymphadenoapthy. HEENT:  Moist mucus membranes. No oral ulcers. No scleral injection or icterus.  Cardiovascular:  Regular rate and rhythm. No edema. No appreciable JVD.  Pulmonary:  Normal work of breathing on room air. No accessory muscle use. Abdomen: Soft. Normal bowel sounds. Nondistended. Grossly nontender. Musculoskeletal:  Normal bulk and tone. No joint deformity or effusion appreciated. Neurological:  Oriented to person, place, year, & president. No meningismus. Following commands.   LABS:  BMET  Recent Labs Lab 08/22/16 0411 08/23/16 0511 08/24/16 0317  NA 134* 135 137  K 3.4* 4.5 3.6  CL 99* 101 109  CO2 26 26 21*  BUN <5* <5* 5*  CREATININE 0.63 0.63 0.70  GLUCOSE 114* 104* 124*    Electrolytes  Recent Labs Lab 08/21/16 0026  08/21/16 0512  08/22/16 0411 08/23/16 0511 08/24/16 0317  CALCIUM  --   < > 7.9*  < > 9.4 10.3 9.8  MG 1.9  --  1.8  --   --   --   --   PHOS 2.6  --  2.1*  --   --   --   --   < > = values in this interval not displayed.  CBC  Recent Labs Lab 08/22/16 0411 08/23/16 0511 08/24/16 0317  WBC 3.6* 3.6* 3.1*  HGB 14.0 14.3 14.2  HCT 39.6 41.7 40.7  PLT 111* 119* 126*    Coag's  Recent Labs Lab 08/21/16 0026  APTT 29  INR 1.11    Sepsis Markers  Recent Labs Lab 08/21/16 0026 08/21/16 0031 08/21/16 0147 08/21/16 0512  LATICACIDVEN  --  3.09* 1.8 0.9  PROCALCITON 0.26  --   --   --     ABG No results for input(s): PHART, PCO2ART, PO2ART in the last 168 hours.  Liver Enzymes  Recent Labs Lab 08/20/16 1920 08/21/16 0512  AST 200* 88*  ALT 53 37  ALKPHOS 79 49  BILITOT 1.7* 1.6*  ALBUMIN 5.0 3.7    Cardiac Enzymes No results for input(s): TROPONINI, PROBNP in the last 168 hours.  Glucose  Recent Labs Lab 08/21/16 1136 08/21/16 1632 08/21/16 2024 08/21/16 2243 08/22/16 0047 08/22/16 0258  GLUCAP 95 87 131* 101* 108* 118*     Imaging No results found.   STUDIES:  CT Abd/Pelvis 11/18: IMPRESSION: 1. Hepatic steatosis without focal lesion. 2. Right lower lobe lung base opacities, suggesting aspiration or pneumonia. CXR PA/LAT 11/19:  Personally reviewed by me. No pleural effusion. Hazy right basilar opacity. Heart normal in size & mediastinum normal in contour.  MICROBIOLOGY: Hepatitis A IgM:  Negative Hepatitis B Surface Ag:  Negative Hepatitis B Core Ab:  Negative Hepatitis C Ab:  <0.1 HIV:  Nonreactive  MRSA PCR:  Negative Blood Ctx x2 11/19 >> Sputum Ctx 11/19:  Oral Flora  ANTIBIOTICS: Vancomycin 11/19 - 11/20 Levaquin 11/18 >>  SIGNIFICANT EVENTS: 11/18 - Admit  LINES/TUBES: PIV  ASSESSMENT / PLAN:  NEUROLOGIC A:   Acute Encephalopathy - Multifactorial. Improving. EtOH Abuse/Use THC Use  P:   RASS goal: 0 Thiamine 500mg  IV q8hr x 3 days Continuing Folic Acid 1mg  IV daily Precedex gtt weaned to off Change to Valium 2mg  PO q12hr Starting CIWA Med-Surg Protocol Sitter at bedside ordered  PULMONARY A: Aspiration Pneumonia vs Pneumonitis Right Lower Lobe Tobacco Use Disorder  P:   Continuous pulse oximetry Nicotine Patch 21mg /24hr  CARDIOVASCULAR A:  Hypertension  P:  Continuous telemetry monitoring Vitals per unit protocol Hydralazine IV prn Starting Clonidine Patch TTS-1 Weekly  RENAL A:   Hypokalemia - Resolved. AGMA - Resolved. Likely due to starvation. Lactic Acidosis - Resolved.  P:   Trending renal function & electrolytes daily Replacing electrolytes as indicated  GASTROINTESTINAL A:   Elevated AST - Likely due to EtOH use. Hepatic Steatosis - Seen on CT imaging.  H/O GERD H/O PUD  P:   Starting Regular diet Monitoring LFTs intermittently Continuing Pepcid IV q12hr  HEMATOLOGIC/ONCOLOGIC A:   Thrombocytopenia - Likely splenic sequestration. Mild and improving. Leukopenia - Mild.  H/O Colon Cancer - S/P Resection.  P:  Trending cell  counts daily w/ CBC Lovenox [place] daily SCDs  INFECTIOUS A:   Possible Aspiration Pneumonia.  P:   Empiric Levaquin Day #5/7 Awaiting culture results Plan to re-culture for fever  ENDOCRINE A:   Risk for Hypoglycemia    P:   Monitoring glucose on daily labs  FAMILY  - Updates: No family at bedside 11/22.  - Inter-disciplinary family meet or Palliative Care meeting due by:  11/28.  TODAY'S SUMMARY:  50 y.o. male with chronic alcohol and tobacco use. Patient with worsening mentation likely secondary to hepatic encephalopathy. Switching Valium to oral twice daily and starting CIWA protocol for alcohol withdrawal. Continuing high dose IV thiamine with plan to transition to PO Thiamine after 3 days. Advancing diet as tolerated. We will monitor him in the ICU off Precedex to see if he remains stable & is able to transfer out of the ICU.   I have spent a  total of 32 minutes of critical care time today caring for the patient and reviewing the patient's electronic medical record.  Sonia Baller Ashok Cordia, M.D. [facility] Pulmonary & Critical Care Pager:  [phone] After 3pm or if no response, call [phone] 08/24/2016, 9:46 AM

## 2016-08-24 NOTE — Progress Notes (Signed)
PCCM Attending Re-Rounding Note: Patient seen this afternoon remaining off of Precedex infusion. 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 He is reporting some chronic pain from an old fracture in his right foot that has been evaluated by Orthopedic Surgery per his report and recommended against surgical correction. I informed him that without proper nutrition healing will be delayed. Transitioning patient to stepdown status. Hospitalist will assume care & PCCM will sign off as of 11/23.   Sonia Baller Ashok Cordia, M.D. [facility] Pulmonary & Critical Care Pager:  [phone] After 3pm or if no response, call [phone] 2:53 PM 08/24/16

## 2016-08-24 NOTE — Progress Notes (Signed)
Patient appears restless, and uncomfortable at this time, c/o abdominal "fullness" and feeling "uneasy" heart rate remains elevated 128 bpm and blood pressure remains elevated 180/87

## 2016-08-24 NOTE — Progress Notes (Signed)
PHARMACIST - PHYSICIAN COMMUNICATION DR:   Ashok Cordia CONCERNING: IV to Oral Route Change Policy  RECOMMENDATION: This patient is receiving Levaquin, Famotidine, and Folic acid by the intravenous route.  Based on criteria approved by the Pharmacy and Therapeutics Committee, these are being converted to the equivalent oral dose form(s).  ---> High dose Thiamine IV for encephalopathy was NOT changed to PO.   DESCRIPTION: These criteria include:  Patient being treated for a respiratory tract infection, urinary tract infection, cellulitis or clostridium difficile associated diarrhea if on metronidazole  The patient is not neutropenic and does not exhibit a GI malabsorption state  The patient is eating (either orally or via tube) and/or has been taking other orally administered medications for a least 24 hours  The patient is improving clinically and has a Tmax < 100.5  If you have questions about this conversion, please contact the Pharmacy Department   [phone])  [facility] PharmD, [place] Pager [phone] 08/24/2016 2:12 PM

## 2016-08-24 NOTE — Progress Notes (Signed)
eLink Physician-Brief Progress Note Patient Name: Richard [name] DOB: 1966/07/21 MRN: [id number]   Date of Service  08/24/2016  HPI/Events of Note  Sitting calm but tachy cand hypertensive and RN feels increased early evidence of withdrawal  eICU Interventions  Increase ativan prn using icu protocol     Intervention Category Major Interventions: Delirium, psychosis, severe agitation - evaluation and management  [name] 08/24/2016, 6:55 PM

## 2016-08-24 NOTE — Progress Notes (Signed)
Pharmacy Antibiotic Note  Richard [name] is a 50 y.o. male admitted on 08/20/2016 with pneumonia.  PMH significant for colon cancer and presents with complaints of abdominal pain and vomiting.  CTAngio suggests PNA. Pharmacy was initially consulted for Vancomycin & Levaquin dosing, narrowed to Levaquin alone.  Plan:  Continue Levaquin 750mg  q24h, but change from IV to PO formulation. Dosage remains stable and need for further dosage adjustment appears unlikely at present.  Pharmacy will sign off at this time.  Please reconsult if a change in clinical status warrants re-evaluation of dosage.   Height: 6' (182.9 cm) Weight: 138 lb 0.1 oz (62.6 kg) IBW/kg (Calculated) : 77.6  Temp (24hrs), Avg:97.6 F (36.4 C), Min:97.3 F (36.3 C), Max:98.2 F (36.8 C)   Recent Labs Lab 08/20/16 1920 08/20/16 2228 08/21/16 0031 08/21/16 0147 08/21/16 0512 08/21/16 0800 08/22/16 0411 08/23/16 0511 08/24/16 0317  WBC 11.4*  --   --   --  9.0  --  3.6* 3.6* 3.1*  CREATININE 1.09  --   --  0.84 0.84 0.87 0.63 0.63 0.70  LATICACIDVEN  --  5.19* 3.09* 1.8 0.9  --   --   --   --     Estimated Creatinine Clearance: 100 mL/min (by C-G formula based on SCr of 0.7 mg/dL).    Allergies  Allergen Reactions  . Aspirin Other (See Comments)    Acid reflux   . Penicillins Hives    Has patient had a PCN reaction causing immediate rash, facial/tongue/throat swelling, SOB or lightheadedness with hypotension: yes Has patient had a PCN reaction causing severe rash involving mucus membranes or skin necrosis: no Has patient had a PCN reaction that required hospitalization: no Has patient had a PCN reaction occurring within the last 10 years: no If all of the above answers are "NO", then may proceed with Cephalosporin use.     Antimicrobials this admission: 11/19 vanc >>  11/19 11/19 levaquin >>    Dose adjustments this admission:    Microbiology results: 11/19 BCx: ngtd 11/19 sputum: c/w normal  flora 11/19 MRSA PCR: neg 11/18 Strep Pneumo UAg: positive 11/21 MRSA PCR: neg  Thank you for allowing pharmacy to be a part of this patient's care.  Gretta Arab PharmD, BCPS Pager [phone] 08/24/2016 1:41 PM

## 2016-08-24 NOTE — Progress Notes (Signed)
eLink Physician-Brief Progress Note Patient Name: Richard [name] DOB: 01-12-1966 MRN: [id number]   Date of Service  08/24/2016  HPI/Events of Note  HR 159. SVT on EKG. BEnzo did not help. He is sittnig calmly  eICU Interventions  Start cardizem gtt     Intervention Category Major Interventions: Arrhythmia - evaluation and management  Richard [name] 08/24/2016, 9:40 PM

## 2016-08-25 LAB — CBC WITH DIFFERENTIAL/PLATELET
Basophils Absolute: 0 10*3/uL (ref 0.0–0.1)
Basophils Relative: 1 %
EOS PCT: 2 %
Eosinophils Absolute: 0.1 10*3/uL (ref 0.0–0.7)
HEMATOCRIT: 35.6 % — AB (ref 39.0–52.0)
Hemoglobin: 12.5 g/dL — ABNORMAL LOW (ref 13.0–17.0)
LYMPHS ABS: 1 10*3/uL (ref 0.7–4.0)
LYMPHS PCT: 28 %
MCH: 34.1 pg — AB (ref 26.0–34.0)
MCHC: 35.1 g/dL (ref 30.0–36.0)
MCV: 97 fL (ref 78.0–100.0)
MONO ABS: 0.7 10*3/uL (ref 0.1–1.0)
Monocytes Relative: 19 %
NEUTROS ABS: 1.8 10*3/uL (ref 1.7–7.7)
Neutrophils Relative %: 50 %
PLATELETS: 135 10*3/uL — AB (ref 150–400)
RBC: 3.67 MIL/uL — AB (ref 4.22–5.81)
RDW: 12.7 % (ref 11.5–15.5)
WBC: 3.7 10*3/uL — ABNORMAL LOW (ref 4.0–10.5)

## 2016-08-25 LAB — RENAL FUNCTION PANEL
ANION GAP: 6 (ref 5–15)
Albumin: 3.3 g/dL — ABNORMAL LOW (ref 3.5–5.0)
BUN: 7 mg/dL (ref 6–20)
CHLORIDE: 107 mmol/L (ref 101–111)
CO2: 22 mmol/L (ref 22–32)
Calcium: 9.1 mg/dL (ref 8.9–10.3)
Creatinine, Ser: 0.81 mg/dL (ref 0.61–1.24)
GFR calc Af Amer: >60 mL/min (ref >60)
GFR calc non Af Amer: >60 mL/min (ref >60)
GLUCOSE: 97 mg/dL (ref 65–99)
POTASSIUM: 3.3 mmol/L — AB (ref 3.5–5.1)
Phosphorus: 3.3 mg/dL (ref 2.5–4.6)
Sodium: 135 mmol/L (ref 135–145)

## 2016-08-25 LAB — MAGNESIUM: Magnesium: 1.6 mg/dL — ABNORMAL LOW (ref 1.7–2.4)

## 2016-08-25 MED ORDER — POTASSIUM CHLORIDE CRYS ER 20 MEQ PO TBCR
40.0000 meq | EXTENDED_RELEASE_TABLET | Freq: Once | ORAL | Status: AC
  Administered 2016-08-25: 40 meq via ORAL
  Filled 2016-08-25: qty 2

## 2016-08-25 MED ORDER — GI COCKTAIL ~~LOC~~
30.0000 mL | Freq: Once | ORAL | Status: AC
  Administered 2016-08-25: 30 mL via ORAL
  Filled 2016-08-25: qty 30

## 2016-08-25 MED ORDER — PANTOPRAZOLE SODIUM 40 MG PO TBEC
40.0000 mg | DELAYED_RELEASE_TABLET | Freq: Every day | ORAL | Status: DC
  Administered 2016-08-25: 40 mg via ORAL
  Filled 2016-08-25: qty 1

## 2016-08-25 MED ORDER — LABETALOL HCL 100 MG PO TABS
100.0000 mg | ORAL_TABLET | Freq: Two times a day (BID) | ORAL | Status: DC
  Administered 2016-08-25 – 2016-08-27 (×5): 100 mg via ORAL
  Filled 2016-08-25 (×5): qty 1

## 2016-08-25 NOTE — Progress Notes (Addendum)
Patient ID: Richard [name], male   DOB: 03/01/1966, 50 y.o.   MRN: [id number]  PROGRESS NOTE    Richard [name]  [id number] DOB: 10-01-1966 DOA: 08/20/2016  PCP: Pcp Not In System   Brief Narrative:  50 y.o. male with known history of chronic alcohol abuse and history of colon cancer status post resection. Patient presented with a 2 day history of abdominal pain, nausea, and vomiting. Pt was noted to have significant acidosis and elevated lactic acid on admission. Patient placed on CIWA protocol. Due to patient's ongoing combative nature and altered mental status he was transferred to the ICU for further treatment and has required precedex drip. In addition, he is on levaquin for aspiration pneumonia.     Assessment & Plan:   Principal Problem: Acute metabolic encephalopathy / acute alcohol intoxication / alcohol withdrawal delirium - Alcohol level 103 on admission  - Patient has required Precedex drip which now has been stopped 08/25/2016 - Has ativan as needed and clonidine patch  - Mental status good at this time, patient is alert and oriented to time, place and person - Transfer to telemetry  - Continue to monitor for withdrawals  Active Problems:   Sepsis secondary to aspiration pneumonia ([place]) /  Lactic acidosis / Intractable nausea and vomiting / Leukopenia  - RLL infiltrate - Started on Levaquin - Blood cx so far negative - Stable respiratory status     Thrombocytopenia (HCC) - Likely bone marrow suppression from history of alcohol abuse - Platelet count 135 this morning    Depression / Suicidal ideations - Sitter at bedside - Psych consulted     Hypokalemia - Likely secondary to history of alcohol abuse, sepsis - Supplemented - Follow-up BMP tomorrow morning    Essential hypertension / sinus tachycardia - Patient also tachycardic which improved with Cardizem drip. Cardizem drip stopped 08/24/2016 - His heart rate this morning is 109 so we will use labetalol 100  mg twice daily    Tobacco abuse - Counseled on smoking cessation - Continue nicotine patch    DVT prophylaxis: Lovenox subcutaneous Code Status: full code  Family Communication: No family at the bedside Disposition Plan: transfer to telemetry floor today    Consultants:   PCCM initially attending and [place] assumed car as of 08/25/2016  Psych  Procedures:   None   Antimicrobials:   Levaquin    Subjective: No overnight events.   Objective: Vitals:   08/25/16 0700 08/25/16 0730 08/25/16 0752 08/25/16 0800  BP: (!) 149/100 (!) 145/99  131/75  Pulse: 89 83  (!) 109  Resp: 19 17  19   Temp:   98 F (36.7 C)   TempSrc:   Oral   SpO2: 100% 100%  100%  Weight:      Height:        Intake/Output Summary (Last 24 hours) at 08/25/16 1008 Last data filed at 08/25/16 0900  Gross per 24 hour  Intake           2887.5 ml  Output              550 ml  Net           2337.5 ml   Filed Weights   08/20/16 1840 08/21/16 1529  Weight: 65.8 kg (145 lb) 62.6 kg (138 lb 0.1 oz)    Examination:  General exam: Appears calm and comfortable  Respiratory system: Clear to auscultation. Respiratory effort normal. Cardiovascular system: S1 & S2 heard, RRR. No JVD,  murmurs, rubs, gallops or clicks. No pedal edema. Gastrointestinal system: Abdomen is nondistended, soft and nontender. No organomegaly or masses felt. Normal bowel sounds heard. Central nervous system: Alert and oriented. No focal neurological deficits. Extremities: Symmetric 5 x 5 power. Skin: No rashes, lesions or ulcers Psychiatry: Judgement and insight appear normal. Mood & affect appropriate.   Data Reviewed: I have personally reviewed following labs and imaging studies  CBC:  Recent Labs Lab 08/21/16 0512 08/22/16 0411 08/23/16 0511 08/24/16 0317 08/25/16 0320  WBC 9.0 3.6* 3.6* 3.1* 3.7*  NEUTROABS  --   --   --   --  1.8  HGB 12.2* 14.0 14.3 14.2 12.5*  HCT 35.4* 39.6 41.7 40.7 35.6*  MCV 98.9 96.4 96.8  96.2 97.0  PLT 104* 111* 119* 126* [id number]*   Basic Metabolic Panel:  Recent Labs Lab 08/21/16 0026  08/21/16 0512 08/21/16 0800 08/22/16 0411 08/23/16 0511 08/24/16 0317 08/25/16 0320  NA  --   < > 131* 132* 134* 135 137 135  K  --   < > 4.5 4.1 3.4* 4.5 3.6 3.3*  CL  --   < > 104 104 99* 101 109 107  CO2  --   < > 17* 16* 26 26 21* 22  GLUCOSE  --   < > 69 71 114* 104* 124* 97  BUN  --   < > 9 8 <5* <5* 5* 7  CREATININE  --   < > 0.84 0.87 0.63 0.63 0.70 0.81  CALCIUM  --   < > 7.9* 8.0* 9.4 10.3 9.8 9.1  MG 1.9  --  1.8  --   --   --   --  1.6*  PHOS 2.6  --  2.1*  --   --   --   --  3.3  < > = values in this interval not displayed. GFR: Estimated Creatinine Clearance: 98.8 mL/min (by C-G formula based on SCr of 0.81 mg/dL). Liver Function Tests:  Recent Labs Lab 08/20/16 1920 08/21/16 0512 08/25/16 0320  AST 200* 88*  --   ALT 53 37  --   ALKPHOS 79 49  --   BILITOT 1.7* 1.6*  --   PROT 9.0* 6.7  --   ALBUMIN 5.0 3.7 3.3*    Recent Labs Lab 08/20/16 1920  LIPASE 38   No results for input(s): AMMONIA in the last 168 hours. Coagulation Profile:  Recent Labs Lab 08/21/16 0026  INR 1.11   Cardiac Enzymes:  Recent Labs Lab 08/21/16 0026  CKTOTAL 657*   BNP (last 3 results) No results for input(s): PROBNP in the last 8760 hours. HbA1C: No results for input(s): HGBA1C in the last 72 hours. CBG:  Recent Labs Lab 08/21/16 1632 08/21/16 2024 08/21/16 2243 08/22/16 0047 08/22/16 0258  GLUCAP 87 131* 101* 108* 118*   Lipid Profile: No results for input(s): CHOL, HDL, LDLCALC, TRIG, CHOLHDL, LDLDIRECT in the last 72 hours. Thyroid Function Tests: No results for input(s): TSH, T4TOTAL, FREET4, T3FREE, THYROIDAB in the last 72 hours. Anemia Panel: No results for input(s): VITAMINB12, FOLATE, FERRITIN, TIBC, IRON, RETICCTPCT in the last 72 hours. Urine analysis:    Component Value Date/Time   COLORURINE YELLOW 08/20/2016 2310   APPEARANCEUR CLEAR  08/20/2016 2310   LABSPEC 1.030 08/20/2016 2310   PHURINE 5.5 08/20/2016 2310   GLUCOSEU 500 (A) 08/20/2016 2310   HGBUR SMALL (A) 08/20/2016 2310   BILIRUBINUR NEGATIVE 08/20/2016 2310   KETONESUR >80 (A) 08/20/2016 2310  PROTEINUR 30 (A) 08/20/2016 2310   UROBILINOGEN 0.2 01/13/2009 1011   NITRITE NEGATIVE 08/20/2016 2310   LEUKOCYTESUR NEGATIVE 08/20/2016 2310   Sepsis Labs: @LABRCNTIP (procalcitonin:4,lacticidven:4)   Recent Results (from the past 240 hour(s))  Culture, blood (x 2)     Status: None (Preliminary result)   Collection Time: 08/21/16 12:21 AM  Result Value Ref Range Status   Specimen Description BLOOD BLOOD RIGHT FOREARM  Final   Special Requests BOTTLES DRAWN AEROBIC AND ANAEROBIC 5CC EACH  Final   Culture   Final    NO GROWTH 3 DAYS Performed at [facility]    Report Status PENDING  Incomplete  MRSA PCR Screening     Status: None   Collection Time: 08/21/16  1:00 AM  Result Value Ref Range Status   MRSA by PCR NEGATIVE NEGATIVE Final    Comment:        The GeneXpert MRSA Assay (FDA approved for NASAL specimens only), is one component of a comprehensive MRSA colonization surveillance program. It is not intended to diagnose MRSA infection nor to guide or monitor treatment for MRSA infections.   Culture, blood (x 2)     Status: None (Preliminary result)   Collection Time: 08/21/16  1:47 AM  Result Value Ref Range Status   Specimen Description BLOOD LEFT ANTECUBITAL  Final   Special Requests IN PEDIATRIC BOTTLE 1.5CC  Final   Culture   Final    NO GROWTH 3 DAYS Performed at [facility]    Report Status PENDING  Incomplete  Culture, sputum-assessment     Status: None   Collection Time: 08/21/16  2:46 AM  Result Value Ref Range Status   Specimen Description SPUTUM  Final   Special Requests Normal  Final   Sputum evaluation   Final    Sputum specimen not acceptable for testing.  Please recollect.   RESULT CALLED TO, READ BACK BY  AND VERIFIED WITH: R.[email or web address],RN 0320 08/21/16 W.SHEA    Report Status 08/21/2016 FINAL  Final  Culture, expectorated sputum-assessment     Status: None   Collection Time: 08/21/16  8:07 AM  Result Value Ref Range Status   Specimen Description SPUTUM  Final   Special Requests Normal  Final   Sputum evaluation   Final    THIS SPECIMEN IS ACCEPTABLE. RESPIRATORY CULTURE REPORT TO FOLLOW.   Report Status 08/21/2016 FINAL  Final  Culture, respiratory (NON-Expectorated)     Status: None   Collection Time: 08/21/16  8:07 AM  Result Value Ref Range Status   Specimen Description SPUTUM  Final   Special Requests NONE  Final   Gram Stain   Final    NO WBC SEEN FEW SQUAMOUS EPITHELIAL CELLS PRESENT FEW GRAM POSITIVE RODS FEW GRAM POSITIVE COCCI IN PAIRS IN CLUSTERS FEW GRAM NEGATIVE RODS    Culture   Final    Consistent with normal respiratory flora. Performed at [facility]    Report Status 08/23/2016 FINAL  Final  MRSA PCR Screening     Status: None   Collection Time: 08/23/16  9:16 AM  Result Value Ref Range Status   MRSA by PCR NEGATIVE NEGATIVE Final    Comment:        The GeneXpert MRSA Assay (FDA approved for NASAL specimens only), is one component of a comprehensive MRSA colonization surveillance program. It is not intended to diagnose MRSA infection nor to guide or monitor treatment for MRSA infections.       Radiology Studies:  Dg Chest 2 View Result Date: 08/21/2016 Mild vascular congestion. Mild right basilar airspace opacity may reflect pneumonia or mild interstitial edema.   Ct Abdomen Pelvis W Contrast Result Date: 08/20/2016 1. Hepatic steatosis without focal lesion. 2. Right lower lobe lung base opacities, suggesting aspiration or pneumonia.     Scheduled Meds: . cloNIDine  0.1 mg Transdermal Weekly  . dextrose  1 Tube Oral Once  . diazepam  2 mg Oral Q12H  . enoxaparin (LOVENOX) injection  40 mg Subcutaneous Q24H  . famotidine  20 mg Oral  BID  . folic acid  1 mg Oral Daily  . levofloxacin  750 mg Oral Q24H  . multivitamin with minerals  1 tablet Oral Daily  . nicotine  21 mg Transdermal Daily  . potassium chloride  40 mEq Oral Once  . sodium chloride  1,000 mL Intravenous Once   And  . sodium chloride  1,000 mL Intravenous Once  . sodium chloride flush  3 mL Intravenous Q12H  . thiamine injection  500 mg Intravenous Q8H   Continuous Infusions: . dexmedetomidine Stopped (08/24/16 0730)  . dextrose 5 % and 0.9% NaCl Stopped (08/23/16 2310)  . dextrose 5 % and 0.9% NaCl 100 mL/hr at 08/24/16 2157  . diltiazem (CARDIZEM) infusion Stopped (08/25/16 0634)     LOS: 4 days    Time spent: 25 minutes  Greater than 50% of the time spent on counseling and coordinating the care.   Leisa Lenz, MD [facility] Pager [phone]  If 7PM-7AM, please contact night-coverage [email or web address] Password [email or web address] 08/25/2016, 10:08 AM

## 2016-08-26 DIAGNOSIS — F17210 Nicotine dependence, cigarettes, uncomplicated: Secondary | ICD-10-CM

## 2016-08-26 DIAGNOSIS — Z880 Allergy status to penicillin: Secondary | ICD-10-CM

## 2016-08-26 DIAGNOSIS — Z888 Allergy status to other drugs, medicaments and biological substances status: Secondary | ICD-10-CM

## 2016-08-26 DIAGNOSIS — Z79899 Other long term (current) drug therapy: Secondary | ICD-10-CM

## 2016-08-26 DIAGNOSIS — Z823 Family history of stroke: Secondary | ICD-10-CM

## 2016-08-26 DIAGNOSIS — R000 Tachycardia, unspecified: Secondary | ICD-10-CM

## 2016-08-26 DIAGNOSIS — Z800 Family history of malignant neoplasm of digestive organs: Secondary | ICD-10-CM

## 2016-08-26 DIAGNOSIS — Z9889 Other specified postprocedural states: Secondary | ICD-10-CM

## 2016-08-26 DIAGNOSIS — Z833 Family history of diabetes mellitus: Secondary | ICD-10-CM

## 2016-08-26 LAB — CBC WITH DIFFERENTIAL/PLATELET
BASOS ABS: 0 10*3/uL (ref 0.0–0.1)
BASOS PCT: 1 %
Eosinophils Absolute: 0.1 10*3/uL (ref 0.0–0.7)
Eosinophils Relative: 2 %
HCT: 32.8 % — ABNORMAL LOW (ref 39.0–52.0)
Hemoglobin: 11.3 g/dL — ABNORMAL LOW (ref 13.0–17.0)
Lymphocytes Relative: 28 %
Lymphs Abs: 1.2 10*3/uL (ref 0.7–4.0)
MCH: 33.6 pg (ref 26.0–34.0)
MCHC: 34.5 g/dL (ref 30.0–36.0)
MCV: 97.6 fL (ref 78.0–100.0)
MONO ABS: 0.8 10*3/uL (ref 0.1–1.0)
Monocytes Relative: 19 %
NEUTROS ABS: 2.2 10*3/uL (ref 1.7–7.7)
NEUTROS PCT: 50 %
Platelets: 152 10*3/uL (ref 150–400)
RBC: 3.36 MIL/uL — ABNORMAL LOW (ref 4.22–5.81)
RDW: 12.9 % (ref 11.5–15.5)
WBC: 4.3 10*3/uL (ref 4.0–10.5)

## 2016-08-26 LAB — RENAL FUNCTION PANEL
ALBUMIN: 3.1 g/dL — AB (ref 3.5–5.0)
ANION GAP: 5 (ref 5–15)
BUN: 8 mg/dL (ref 6–20)
CALCIUM: 8.8 mg/dL — AB (ref 8.9–10.3)
CO2: 21 mmol/L — ABNORMAL LOW (ref 22–32)
Chloride: 109 mmol/L (ref 101–111)
Creatinine, Ser: 0.73 mg/dL (ref 0.61–1.24)
GLUCOSE: 103 mg/dL — AB (ref 65–99)
PHOSPHORUS: 4.1 mg/dL (ref 2.5–4.6)
Potassium: 3.3 mmol/L — ABNORMAL LOW (ref 3.5–5.1)
SODIUM: 135 mmol/L (ref 135–145)

## 2016-08-26 LAB — BASIC METABOLIC PANEL
Anion gap: 5 (ref 5–15)
BUN: 8 mg/dL (ref 6–20)
CALCIUM: 8.8 mg/dL — AB (ref 8.9–10.3)
CHLORIDE: 108 mmol/L (ref 101–111)
CO2: 21 mmol/L — ABNORMAL LOW (ref 22–32)
CREATININE: 0.72 mg/dL (ref 0.61–1.24)
GFR calc non Af Amer: >60 mL/min (ref >60)
Glucose, Bld: 104 mg/dL — ABNORMAL HIGH (ref 65–99)
Potassium: 3.3 mmol/L — ABNORMAL LOW (ref 3.5–5.1)
SODIUM: 134 mmol/L — AB (ref 135–145)

## 2016-08-26 LAB — CULTURE, BLOOD (ROUTINE X 2)
Culture: NO GROWTH
Culture: NO GROWTH

## 2016-08-26 LAB — MAGNESIUM: Magnesium: 1.5 mg/dL — ABNORMAL LOW (ref 1.7–2.4)

## 2016-08-26 MED ORDER — MAGNESIUM SULFATE 2 GM/50ML IV SOLN
2.0000 g | Freq: Once | INTRAVENOUS | Status: AC
  Administered 2016-08-26: 2 g via INTRAVENOUS
  Filled 2016-08-26: qty 50

## 2016-08-26 MED ORDER — POTASSIUM CHLORIDE CRYS ER 20 MEQ PO TBCR
40.0000 meq | EXTENDED_RELEASE_TABLET | Freq: Once | ORAL | Status: AC
  Administered 2016-08-26: 40 meq via ORAL
  Filled 2016-08-26: qty 2

## 2016-08-26 MED ORDER — PANTOPRAZOLE SODIUM 40 MG PO TBEC
40.0000 mg | DELAYED_RELEASE_TABLET | Freq: Two times a day (BID) | ORAL | Status: DC
  Administered 2016-08-26 – 2016-08-27 (×3): 40 mg via ORAL
  Filled 2016-08-26 (×3): qty 1

## 2016-08-26 NOTE — Consult Note (Signed)
[facility] Consult   Reason for Consult:  Depression and alcohol abuse Referring Physician:  Dr. Charlies Silvers Patient Identification: Richard [name] MRN:  [id number] Principal Diagnosis: Alcohol abuse Diagnosis:   Patient Active Problem List   Diagnosis Date Noted  . Hepatic steatosis [K76.0] 08/21/2016  . Thrombocytopenia ([place]) [D69.6] 08/21/2016  . Alcohol abuse [F10.10] 08/21/2016  . Alcohol withdrawal ([place]) [F10.239] 08/20/2016  . Dehydration [E86.0] 08/20/2016  . Intractable nausea and vomiting [R11.2] 08/20/2016  . Lactic acidosis [E87.2] 08/20/2016  . Aspiration pneumonia ([place]) [J69.0] 08/20/2016  . Sepsis ([place]) [A41.9] 08/20/2016    Total Time spent with patient: 1 hour  Subjective:   Richard [name] is a 50 y.o. male patient admitted with depression and alcohol abuse.  HPI:  Richard [name] is a 50 y.o. male , seen, chart reviewed and case discussed with LCSW who accompanied for this psychiatric consultation and evaluation for increased symptoms of depression and suicidal ideation. Patient reported that he has been doing fine except his medical problems abdominal pain, on and off over the years, low blood counts, dehydration secondary to drinking alcohol and also smoking tobacco. Patient endorses history of colon cancer 2007 and reportedly smokes 1 pack per day and drinks 2 shots daily. Patient denied current symptoms of depression, anxiety, auditory/visual hallucinations, delusions or paranoia. Patient has denied active suicidal/homicidal ideation, intention or plans.  Past Psychiatric History: Patient has history of depression in 1996 after separated from his first wife but denied suicidal attempts. Patient has no previous acute psychiatric hospitalization.  Risk to Self: Is patient at risk for suicide?: Yes Risk to Others:   Prior Inpatient Therapy:   Prior Outpatient Therapy:    Past Medical History:  Past Medical History:  Diagnosis Date  . Acid reflux   .  Colon cancer ([place])   . Colon cancer ([place])   . PUD (peptic ulcer disease)     Past Surgical History:  Procedure Laterality Date  . COLON RESECTION     Family History:  Family History  Problem Relation Age of Onset  . Colon cancer Father   . Cancer Sister   . CAD Neg Hx   . Stroke Neg Hx   . Diabetes Neg Hx    Family Psychiatric  History: Not contributory Social History:  History  Alcohol Use  . Yes    Comment: heavy alcohol abuse a beer and couple of shots a day for past  14 years     History  Drug Use No    Social History   Social History  . Marital status: Legally Separated    Spouse name: N/A  . Number of children: N/A  . Years of education: N/A   Social History Main Topics  . Smoking status: Current Every Day Smoker    Packs/day: 1.00  . Smokeless tobacco: Never Used  . Alcohol use Yes     Comment: heavy alcohol abuse a beer and couple of shots a day for past  14 years  . Drug use: No  . Sexual activity: Not Asked   Other Topics Concern  . None   Social History Narrative  . None   Additional Social History:    Allergies:   Allergies  Allergen Reactions  . Aspirin Other (See Comments)    Acid reflux   . Penicillins Hives    Has patient had a PCN reaction causing immediate rash, facial/tongue/throat swelling, SOB or lightheadedness with hypotension: yes Has patient had a PCN reaction  causing severe rash involving mucus membranes or skin necrosis: no Has patient had a PCN reaction that required hospitalization: no Has patient had a PCN reaction occurring within the last 10 years: no If all of the above answers are "NO", then may proceed with Cephalosporin use.     Labs:  Results for orders placed or performed during the hospital encounter of 08/20/16 (from the past 48 hour(s))  Renal function panel     Status: Abnormal   Collection Time: 08/25/16  3:20 AM  Result Value Ref Range   Sodium 135 135 - 145 mmol/L   Potassium 3.3 (L) 3.5 - 5.1 mmol/L    Chloride 107 101 - 111 mmol/L   CO2 22 22 - 32 mmol/L   Glucose, Bld 97 65 - 99 mg/dL   BUN 7 6 - 20 mg/dL   Creatinine, Ser 0.81 0.61 - 1.24 mg/dL   Calcium 9.1 8.9 - 10.3 mg/dL   Phosphorus 3.3 2.5 - 4.6 mg/dL   Albumin 3.3 (L) 3.5 - 5.0 g/dL   GFR calc non Af Amer >60 >60 mL/min   GFR calc Af Amer >60 >60 mL/min    Comment: (NOTE) The eGFR has been calculated using the CKD EPI equation. This calculation has not been validated in all clinical situations. eGFR's persistently <60 mL/min signify possible Chronic Kidney Disease.    Anion gap 6 5 - 15  CBC with Differential/Platelet     Status: Abnormal   Collection Time: 08/25/16  3:20 AM  Result Value Ref Range   WBC 3.7 (L) 4.0 - 10.5 K/uL   RBC 3.67 (L) 4.22 - 5.81 MIL/uL   Hemoglobin 12.5 (L) 13.0 - 17.0 g/dL   HCT 35.6 (L) 39.0 - 52.0 %   MCV 97.0 78.0 - 100.0 fL   MCH 34.1 (H) 26.0 - 34.0 pg   MCHC 35.1 30.0 - 36.0 g/dL   RDW 12.7 11.5 - 15.5 %   Platelets 135 (L) 150 - 400 K/uL   Neutrophils Relative % 50 %   Neutro Abs 1.8 1.7 - 7.7 K/uL   Lymphocytes Relative 28 %   Lymphs Abs 1.0 0.7 - 4.0 K/uL   Monocytes Relative 19 %   Monocytes Absolute 0.7 0.1 - 1.0 K/uL   Eosinophils Relative 2 %   Eosinophils Absolute 0.1 0.0 - 0.7 K/uL   Basophils Relative 1 %   Basophils Absolute 0.0 0.0 - 0.1 K/uL  Magnesium     Status: Abnormal   Collection Time: 08/25/16  3:20 AM  Result Value Ref Range   Magnesium 1.6 (L) 1.7 - 2.4 mg/dL  Renal function panel     Status: Abnormal   Collection Time: 08/26/16  4:09 AM  Result Value Ref Range   Sodium 135 135 - 145 mmol/L   Potassium 3.3 (L) 3.5 - 5.1 mmol/L   Chloride 109 101 - 111 mmol/L   CO2 21 (L) 22 - 32 mmol/L   Glucose, Bld 103 (H) 65 - 99 mg/dL   BUN 8 6 - 20 mg/dL   Creatinine, Ser 0.73 0.61 - 1.24 mg/dL   Calcium 8.8 (L) 8.9 - 10.3 mg/dL   Phosphorus 4.1 2.5 - 4.6 mg/dL   Albumin 3.1 (L) 3.5 - 5.0 g/dL   GFR calc non Af Amer >60 >60 mL/min   GFR calc Af Amer >60  >60 mL/min    Comment: (NOTE) The eGFR has been calculated using the CKD EPI equation. This calculation has not been validated in all  clinical situations. eGFR's persistently <60 mL/min signify possible Chronic Kidney Disease.    Anion gap 5 5 - 15  CBC with Differential/Platelet     Status: Abnormal   Collection Time: 08/26/16  4:09 AM  Result Value Ref Range   WBC 4.3 4.0 - 10.5 K/uL   RBC 3.36 (L) 4.22 - 5.81 MIL/uL   Hemoglobin 11.3 (L) 13.0 - 17.0 g/dL   HCT 32.8 (L) 39.0 - 52.0 %   MCV 97.6 78.0 - 100.0 fL   MCH 33.6 26.0 - 34.0 pg   MCHC 34.5 30.0 - 36.0 g/dL   RDW 12.9 11.5 - 15.5 %   Platelets 152 150 - 400 K/uL   Neutrophils Relative % 50 %   Neutro Abs 2.2 1.7 - 7.7 K/uL   Lymphocytes Relative 28 %   Lymphs Abs 1.2 0.7 - 4.0 K/uL   Monocytes Relative 19 %   Monocytes Absolute 0.8 0.1 - 1.0 K/uL   Eosinophils Relative 2 %   Eosinophils Absolute 0.1 0.0 - 0.7 K/uL   Basophils Relative 1 %   Basophils Absolute 0.0 0.0 - 0.1 K/uL  Magnesium     Status: Abnormal   Collection Time: 08/26/16  4:09 AM  Result Value Ref Range   Magnesium 1.5 (L) 1.7 - 2.4 mg/dL  Basic metabolic panel     Status: Abnormal   Collection Time: 08/26/16  4:09 AM  Result Value Ref Range   Sodium 134 (L) 135 - 145 mmol/L   Potassium 3.3 (L) 3.5 - 5.1 mmol/L   Chloride 108 101 - 111 mmol/L   CO2 21 (L) 22 - 32 mmol/L   Glucose, Bld 104 (H) 65 - 99 mg/dL   BUN 8 6 - 20 mg/dL   Creatinine, Ser 0.72 0.61 - 1.24 mg/dL   Calcium 8.8 (L) 8.9 - 10.3 mg/dL   GFR calc non Af Amer >60 >60 mL/min   GFR calc Af Amer >60 >60 mL/min    Comment: (NOTE) The eGFR has been calculated using the CKD EPI equation. This calculation has not been validated in all clinical situations. eGFR's persistently <60 mL/min signify possible Chronic Kidney Disease.    Anion gap 5 5 - 15    Current Facility-Administered Medications  Medication Dose Route Frequency Provider Last Rate Last Dose  . acetaminophen  (TYLENOL) tablet 650 mg  650 mg Oral Q6H PRN Toy Baker, MD   650 mg at 08/24/16 1753   Or  . acetaminophen (TYLENOL) suppository 650 mg  650 mg Rectal Q6H PRN Toy Baker, MD      . cloNIDine (CATAPRES - Dosed in mg/24 hr) patch 0.1 mg  0.1 mg Transdermal Weekly Javier Glazier, MD   0.1 mg at 08/24/16 1116  . dextrose (GLUTOSE) 40 % oral gel 37.5 g  1 Tube Oral Once [employer], PA-C      . dextrose 5 %-0.9 % sodium chloride infusion   Intravenous Continuous [name], MD 50 mL/hr at 08/26/16 0617    . diazepam (VALIUM) tablet 2 mg  2 mg Oral Q12H Javier Glazier, MD   2 mg at 08/26/16 1034  . enoxaparin (LOVENOX) injection 40 mg  40 mg Subcutaneous Q24H Toy Baker, MD   40 mg at 08/26/16 1034  . folic acid (FOLVITE) tablet 1 mg  1 mg Oral Daily Randa Spike, RPH   1 mg at 08/26/16 1035  . hydrALAZINE (APRESOLINE) injection 10-40 mg  10-40 mg Intravenous Q4H PRN Nathaneil Canary  Jerral Ralph, MD   20 mg at 08/25/16 0728  . labetalol (NORMODYNE) tablet 100 mg  100 mg Oral BID [name], MD   100 mg at 08/26/16 1034  . levofloxacin (LEVAQUIN) tablet 750 mg  750 mg Oral Q24H Randa Spike, RPH   750 mg at 08/25/16 2224  . LORazepam (ATIVAN) tablet 1 mg  1 mg Oral Q6H PRN Javier Glazier, MD   1 mg at 08/25/16 0729   Or  . LORazepam (ATIVAN) injection 1 mg  1 mg Intravenous Q6H PRN Javier Glazier, MD      . LORazepam (ATIVAN) injection 1-2 mg  1-2 mg Intravenous Q1H PRN Brand Males, MD      . midazolam (VERSED) injection 1-2 mg  1-2 mg Intravenous Q1H PRN Brand Males, MD   2 mg at 08/25/16 0149  . multivitamin with minerals tablet 1 tablet  1 tablet Oral Daily Debbe Odea, MD   1 tablet at 08/26/16 1034  . nicotine (NICODERM CQ - dosed in mg/24 hours) patch 21 mg  21 mg Transdermal Daily Debbe Odea, MD   21 mg at 08/26/16 1033  . ondansetron (ZOFRAN) tablet 4 mg  4 mg Oral Q6H PRN Toy Baker, MD   4 mg at 08/24/16 1753   Or  . ondansetron  (ZOFRAN) injection 4 mg  4 mg Intravenous Q6H PRN Toy Baker, MD      . pantoprazole (PROTONIX) EC tablet 40 mg  40 mg Oral BID [name], MD   40 mg at 08/26/16 1034  . sodium chloride 0.9 % bolus 1,000 mL  1,000 mL Intravenous Once Toy Baker, MD       And  . sodium chloride 0.9 % bolus 1,000 mL  1,000 mL Intravenous Once Toy Baker, MD      . sodium chloride flush (NS) 0.9 % injection 3 mL  3 mL Intravenous Q12H Toy Baker, MD   3 mL at 08/25/16 2224    Musculoskeletal: Strength & Muscle Tone: within normal limits Gait & Station: unable to stand Patient leans: N/A  Psychiatric Specialty Exam: Physical Exam as per history and physical   ROSComplaining about generalized weakness, stomach pain on and off and recent dehydration due to alcohol abuse. Patient denied nausea, vomiting, abdomen pain, shortness of breath and withdrawal shakes and sweating.  No Fever-chills, No Headache, No changes with Vision or hearing, reports vertigo No problems swallowing food or Liquids, No Chest pain, Cough or Shortness of Breath, No Abdominal pain, No Nausea or Vommitting, Bowel movements are regular, No Blood in stool or Urine, No dysuria, No new skin rashes or bruises, No new joints pains-aches,  No new weakness, tingling, numbness in any extremity, No recent weight gain or loss, No polyuria, polydypsia or polyphagia,  A full 10 point Review of Systems was done, except as stated above, all other Review of Systems were negative.  Blood pressure (!) 150/87, pulse 84, temperature 98.3 F (36.8 C), temperature source Oral, resp. rate 18, height 6' (1.829 m), weight 62.6 kg (138 lb 0.1 oz), SpO2 100 %.Body mass index is 18.72 kg/m.  General Appearance: Casual  Eye Contact:  Good  Speech:  Clear and Coherent  Volume:  Decreased  Mood:  Euthymic  Affect:  Appropriate and Congruent  Thought Process:  Coherent and Goal Directed  Orientation:  Full (Time, Place,  and Person)  Thought Content:  WDL  Suicidal Thoughts:  No  Homicidal Thoughts:  No  Memory:  Immediate;   Good Recent;   Fair Remote;   Fair  Judgement:  Intact  Insight:  Fair  Psychomotor Activity:  Normal  Concentration:  Concentration: Good and Attention Span: Fair  Recall:  Good  Fund of Knowledge:  Good  Language:  Good  Akathisia:  Negative  Handed:  Right  AIMS (if indicated):     Assets:  Communication Skills Desire for Improvement Financial Resources/Insurance Housing Leisure Time Resilience Social Support Transportation  ADL's:  Intact  Cognition:  WNL  Sleep:        Treatment Plan Summary: 50 years old male with generalized weakness, on and off stomach pain, dehydration and alcohol abuse but denied current symptoms of depression, anxiety and psychosis. Patient has no evidence of psychosis. Patient denied active suicidal/homicidal ideation, intention or plans.  Patient has no safety concerns.  No psychotropic medication was recommended at this time  Patient will be referred to the outpatient counseling services and medication management as needed Case discussed with LCSW.  Daily contact with patient to assess and evaluate symptoms and progress in treatment and Medication management  Disposition: No evidence of imminent risk to self or others at present.   Patient does not meet criteria for psychiatric inpatient admission. Supportive therapy provided about ongoing stressors.  Ambrose Finland, MD 08/26/2016 4:03 PM

## 2016-08-26 NOTE — Clinical Social Work Psych Assess (Addendum)
Clinical Social Work Nature conservation officer  Clinical Social Worker:  Lia Hopping, LCSW Date/Time:  08/26/2016, 3:22 PM Referred By:  Clinical Social Work Date Referred:    Reason for Referral:  Behavioral Health Issues   Presenting Symptoms/Problems  Presenting Symptoms/Problems(in person's/family's own words):  " I am not having anything suicidal ideations, I spoke to the guy that was in my room and he took it out to of context."   Patient presented to ED for nausea, stomach pain and vomiting.  Patient has significant history for alcohol abuse.  Patient had withdrawals.  Abuse/Neglect/Trauma History  Abuse/Neglect/Trauma History:  Denies History Abuse/Neglect/Trauma History Comments (indicate dates):  Patient report after he divorced his first wife he was depressed and had SI. But denies having SI since then.  Patient presented to ED for abdominal pain nausea and vomiting. The patient reports he thinks it is because of alcohol.    Psychiatric History  Psychiatric History:  Denies History Psychiatric Medication:  None reported.    Current Mental Health Hospitalizations/Previous Mental Health History: None reported.    Current Provider:  No primary PCP Place and Date:    Current Medications:    Legend:                    Inactive   Active   Linked          Medications 08/26/16 08/27/16 08/28/16 08/29/16 08/30/16 08/31/16 09/01/16  cloNIDine (CATAPRES - Dosed in mg/24 hr) patch 0.1 mg Dose: 0.1 mg Freq: Weekly Route: TD Start: 08/24/16 1100   Admin Instructions:  Remove old patch BEFORE applying new patchChange weekly.        1059   1100      dextrose (GLUTOSE) 40 % oral gel 37.5 g Dose: 1 Tube Freq: Once Route: PO Start: 08/20/16 2315           diazepam (VALIUM) tablet 2 mg Dose: 2 mg Freq: Every 12 hours Route: PO Start: 08/24/16 2200   1034   2200    1000   2200    1000   2200    1000   2200    1000   2200    1000   2200    1000   2200     enoxaparin (LOVENOX) injection 40 mg Dose: 40 mg Freq: Every 24 hours Route: [place] Start: 08/21/16 1000   Admin Instructions:  Pharmacy may adjust. Do NOT expel air bubble from syringe before giving.   1034    1000    1000    1000    1000    8185    6314     folic acid (FOLVITE) tablet 1 mg Dose: 1 mg Freq: Daily Route: PO Start: 08/25/16 1000   1035    1000    1000    1000    1000    1000    1000     labetalol (NORMODYNE) tablet 100 mg Dose: 100 mg Freq: 2 times daily Route: PO Start: 08/25/16 1015   Admin Instructions:  (BETA BLOCKER)   1034   2200    1000   2200    1000   2200    1000   2200    1000   2200    1000   2200    1000   2200     levofloxacin (LEVAQUIN) tablet 750 mg Dose: 750 mg Freq: Every 24 hours Route: PO Start: 08/24/16 2200  Admin Instructions:  Give 2 hours apart from vitamins, iron, and antacids.   2200    2200    2200    2200    2200    2200    2200     multivitamin with minerals tablet 1 tablet Dose: 1 tablet Freq: Daily Route: PO Start: 08/22/16 1700   1034    1000    1000    1000    1000    1000    1000     nicotine (NICODERM CQ - dosed in mg/24 hours) patch 21 mg Dose: 21 mg Freq: Daily Route: TD Start: 08/21/16 1000   Admin Instructions:  Remove old patch before applying new patch   0959   1033    0959   1000    1000    1000    1000    1000    1000     pantoprazole (PROTONIX) EC tablet 40 mg Dose: 40 mg Freq: 2 times daily Route: PO Start: 08/26/16 1000   1034   2200    1000   2200    1000   2200    1000   2200    1000   2200    1000   2200    1000   2200     sodium chloride 0.9 % bolus 1,000 mL Dose: 1,000 mL Freq: Once Route: IV Start: 08/20/16 2345   Admin Instructions:  Bag # 1 of 2 for Sepsis Bolus.           And sodium chloride 0.9 % bolus 1,000 mL Dose: 1,000 mL Freq:  Once Route: IV Start: 08/21/16 0015   Admin Instructions:  Bag # 2 of 2 for Sepsis Bolus.           sodium chloride flush (NS) 0.9 % injection 3 mL Dose: 3 mL Freq: Every 12 hours Route: IV Start: 08/21/16 0215   (1000)   2200    1000   2200    1000   2200    1000   2200    1000   2200    1000   2200    1000   2200     Medications 08/26/16 08/27/16 08/28/16 08/29/16 08/30/16 08/31/16 09/01/16    Continuous Meds Sorted by Name  for Benn [place] B as of 08/26/16 1530     Previous Inpatient Admission/Date/Reason:  None reported.    Emotional Health/Current Symptoms  Suicide/Self Harm: Suicidal Ideation (ex. "I can't take anymore, I wish I could disappear") (History of Ideations) After divorce of first wife patient reported feeling depressed and had SI.  Suicide Attempt in Past (date/description):  None reported.   Other Harmful Behavior (ex. homicidal ideation) (describe):  None reported.    Psychotic/Dissociative Symptoms  Psychotic/Dissociative Symptoms: None Reported Other Psychotic/Dissociative Symptoms:  Denies auditory or visual hallucinations.    Attention/Behavioral Symptoms  Attention/Behavioral Symptoms: Within Normal Limits Other Attention/Behavioral Symptoms:  Patient has a history of polysubstance use with marijuana and alcohol.    Cognitive Impairment  Cognitive Impairment:  Orientation - Place, Orientation - Self, Orientation - Situation, Orientation - Time Other Cognitive Impairment:  Alert and Oriented.    Mood and Adjustment  Mood and Adjustment:   (Cooperative and Pleasant )   Stress, Anxiety, Trauma, Any Recent Loss/Stressor  Stress, Anxiety, Trauma, Any Recent Loss/Stressor: None Reported Anxiety (frequency):  None reported.   Phobia (specify):  None reported.   Compulsive Behavior (specify):  None reported.  Obsessive Behavior (specify):  None reported.   Other Stress, Anxiety, Trauma, Any Recent  Loss/Stressor:  Patient denies any stressors other than "normal family issues."   Substance Abuse/Use  Substance Abuse/Use: Current Substance Use, Substance Abuse Treatment Needed SBIRT Completed (please refer for detailed history): N/A Self-reported Substance Use (last use and frequency):  Positive for Tetrahydrocannabinol and reports 40oz of beer and three shots in 24 hour period.   Urinary Drug Screen Completed: Yes Alcohol Level:  103   Environment/Housing/Living Arrangement  Environmental/Housing/Living Arrangement: Stable Housing Who is in the Home:  Children 10, 7 and Significant other  Emergency Contact:  [phone]   Financial  Financial:  Nature conservation officer   Patient's Strengths and Goals  Patient's Strengths and Goals (patient's own words):  " I have to stop drinking because it is affecting my health."   Clinical Social Worker's Interpretive Summary  Clinical Social Workers Interpretive Summary:  [place] and psychiatrist met with patient and 48 year old daughter at bedside. Patient wanted daughter to be present during assessment. Patient reports he has been feeling better and not having pains in his stomach. Patient reports he has been having severe pains in stomach for many years. Patient reports he feels his mental health is fine. He denies having suicidal or homicidal ideations. The patient reports SI in the past after divorcing his first wife in 79. The patient reports he loves his family too much.  Patient reports using alcohol daily  And smoking a pack of cigarettes daily. The patient denied any other drugs during assessment.  The patient reports he has been to substance abuse treatment in the past but did not go into detail. Later denied having SA services.  LCSWA provided patient with list of outpatient resources for treatment.  No appointment made due to recognize holiday. Patient plans to follow up.  Disposition  Disposition: LCSWA Provided patient with  SA resources.   Psych Clinical Film/video editor

## 2016-08-26 NOTE — Progress Notes (Signed)
   08/26/16 1100  Clinical Encounter Type  Visited With Patient and family together  Visit Type Initial  Referral From Nurse  Consult/Referral To Chaplain  Spiritual Encounters  Spiritual Needs Emotional;Prayer  Stress Factors  Patient Stress Factors Loss of control  [facility] responded to [facility] consult.  First visit patient was asleep but visited with 48 year old daughter.  Returned later and spoke with patient who requested prayer. Roe Coombs 08/26/16

## 2016-08-26 NOTE — Progress Notes (Signed)
Patient ID: Richard [name], male   DOB: April 27, 1966, 50 y.o.   MRN: [id number]  PROGRESS NOTE    Richard [name]  [id number] DOB: 08/28/1966 DOA: 08/20/2016  PCP: Pcp Not In System   Brief Narrative:  50 y.o. male with known history of chronic alcohol abuse and history of colon cancer status post resection. Patient presented with a 2 day history of abdominal pain, nausea, and vomiting. Pt was noted to have significant acidosis and elevated lactic acid on admission. Patient placed on CIWA protocol. Due to patient's ongoing combative nature and altered mental status he was transferred to the ICU for further treatment and has required precedex drip. In addition, he is on levaquin for aspiration pneumonia.     Assessment & Plan:   Principal Problem: Acute metabolic encephalopathy / acute alcohol intoxication / alcohol withdrawal delirium - Alcohol level 103 on admission  - Patient has required Precedex drip which now has been stopped 08/25/2016 - Has ativan as needed and clonidine patch  - Much better mental status this morning - No reports of further withdrawal  Active Problems:   Sepsis secondary to aspiration pneumonia (HCC) /  Lactic acidosis / Intractable nausea and vomiting / Leukopenia  - RLL infiltrate - Continue Levaquin - Blood cx so far negative    Thrombocytopenia (HCC) - Likely bone marrow suppression from history of alcohol abuse - Platelet count WNL this am    Depression / Suicidal ideations - Sitter at bedside - Psych consulted, I called psych consult again this am     Hypokalemia - Likely secondary to history of alcohol abuse, sepsis - Supplemented potassium and magnesium this am - Check BMP and mag level in am    Essential hypertension / sinus tachycardia - Patient was tachycardic which improved with Cardizem drip. Cardizem drip stopped 08/24/2016 - HR and BP normalized with labetalol 100 mg Q12 hours     Tobacco abuse - Counseled on smoking cessation -  Continue nicotine patch    DVT prophylaxis: Lovenox subcutaneous Code Status: full code  Family Communication: daughter at the bedside Disposition Plan: Discharge home versus [place] the dependent on psychiatry evaluation   Consultants:   PCCM initially attending and [place] assumed car as of 08/25/2016  Psych  Procedures:   None   Antimicrobials:   Levaquin    Subjective: No overnight events.   Objective: Vitals:   08/25/16 1200 08/25/16 1224 08/25/16 2038 08/26/16 0436  BP: (!) 116/53 132/84 (!) 146/85 135/86  Pulse: 99 99 88 80  Resp: (!) 24 18 16 16   Temp:  98.5 F (36.9 C) 98.3 F (36.8 C) 98.2 F (36.8 C)  TempSrc:  Oral Oral Oral  SpO2: 100% 100% 100% 100%  Weight:      Height:        Intake/Output Summary (Last 24 hours) at 08/26/16 1010 Last data filed at 08/26/16 0900  Gross per 24 hour  Intake            772.5 ml  Output              600 ml  Net            172.5 ml   Filed Weights   08/20/16 1840 08/21/16 1529  Weight: 65.8 kg (145 lb) 62.6 kg (138 lb 0.1 oz)    Examination:  General exam: Appears calm and comfortable, No distress Respiratory system: No wheezing, no rhonchi Cardiovascular system: S1 & S2 heard, rate controlled  Gastrointestinal  system: (+) BS, non tender abdomen  Central nervous system: No focal neurological deficits. Extremities: No edema, palpable pulses  Skin: warm and dry  Psychiatry: Normal mood and behavior   Data Reviewed: I have personally reviewed following labs and imaging studies  CBC:  Recent Labs Lab 08/22/16 0411 08/23/16 0511 08/24/16 0317 08/25/16 0320 08/26/16 0409  WBC 3.6* 3.6* 3.1* 3.7* 4.3  NEUTROABS  --   --   --  1.8 2.2  HGB 14.0 14.3 14.2 12.5* 11.3*  HCT 39.6 41.7 40.7 35.6* 32.8*  MCV 96.4 96.8 96.2 97.0 97.6  PLT 111* 119* 126* 135* 0000000   Basic Metabolic Panel:  Recent Labs Lab 08/21/16 0026  08/21/16 0512  08/22/16 0411 08/23/16 0511 08/24/16 0317 08/25/16 0320  08/26/16 0409  NA  --   < > 131*  < > 134* 135 137 135 135  134*  K  --   < > 4.5  < > 3.4* 4.5 3.6 3.3* 3.3*  3.3*  CL  --   < > 104  < > 99* 101 109 107 109  108  CO2  --   < > 17*  < > 26 26 21* 22 21*  21*  GLUCOSE  --   < > 69  < > 114* 104* 124* 97 103*  104*  BUN  --   < > 9  < > <5* <5* 5* 7 8  8   CREATININE  --   < > 0.84  < > 0.63 0.63 0.70 0.81 0.73  0.72  CALCIUM  --   < > 7.9*  < > 9.4 10.3 9.8 9.1 8.8*  8.8*  MG 1.9  --  1.8  --   --   --   --  1.6* 1.5*  PHOS 2.6  --  2.1*  --   --   --   --  3.3 4.1  < > = values in this interval not displayed. GFR: Estimated Creatinine Clearance: 100 mL/min (by C-G formula based on SCr of 0.73 mg/dL). Liver Function Tests:  Recent Labs Lab 08/20/16 1920 08/21/16 0512 08/25/16 0320 08/26/16 0409  AST 200* 88*  --   --   ALT 53 37  --   --   ALKPHOS 79 49  --   --   BILITOT 1.7* 1.6*  --   --   PROT 9.0* 6.7  --   --   ALBUMIN 5.0 3.7 3.3* 3.1*    Recent Labs Lab 08/20/16 1920  LIPASE 38   No results for input(s): AMMONIA in the last 168 hours. Coagulation Profile:  Recent Labs Lab 08/21/16 0026  INR 1.11   Cardiac Enzymes:  Recent Labs Lab 08/21/16 0026  CKTOTAL 657*   BNP (last 3 results) No results for input(s): PROBNP in the last 8760 hours. HbA1C: No results for input(s): HGBA1C in the last 72 hours. CBG:  Recent Labs Lab 08/21/16 1632 08/21/16 2024 08/21/16 2243 08/22/16 0047 08/22/16 0258  GLUCAP 87 131* 101* 108* 118*   Lipid Profile: No results for input(s): CHOL, HDL, LDLCALC, TRIG, CHOLHDL, LDLDIRECT in the last 72 hours. Thyroid Function Tests: No results for input(s): TSH, T4TOTAL, FREET4, T3FREE, THYROIDAB in the last 72 hours. Anemia Panel: No results for input(s): VITAMINB12, FOLATE, FERRITIN, TIBC, IRON, RETICCTPCT in the last 72 hours. Urine analysis:    Component Value Date/Time   COLORURINE YELLOW 08/20/2016 2310   APPEARANCEUR CLEAR 08/20/2016 2310   LABSPEC 1.030  08/20/2016 2310  PHURINE 5.5 08/20/2016 2310   GLUCOSEU 500 (A) 08/20/2016 2310   HGBUR SMALL (A) 08/20/2016 2310   BILIRUBINUR NEGATIVE 08/20/2016 2310   KETONESUR >80 (A) 08/20/2016 2310   PROTEINUR 30 (A) 08/20/2016 2310   UROBILINOGEN 0.2 01/13/2009 1011   NITRITE NEGATIVE 08/20/2016 2310   LEUKOCYTESUR NEGATIVE 08/20/2016 2310   Sepsis Labs: @LABRCNTIP (procalcitonin:4,lacticidven:4)   Recent Results (from the past 240 hour(s))  Culture, blood (x 2)     Status: None (Preliminary result)   Collection Time: 08/21/16 12:21 AM  Result Value Ref Range Status   Specimen Description BLOOD BLOOD RIGHT FOREARM  Final   Special Requests BOTTLES DRAWN AEROBIC AND ANAEROBIC 5CC EACH  Final   Culture   Final    NO GROWTH 4 DAYS Performed at [facility]    Report Status PENDING  Incomplete  MRSA PCR Screening     Status: None   Collection Time: 08/21/16  1:00 AM  Result Value Ref Range Status   MRSA by PCR NEGATIVE NEGATIVE Final    Comment:        The GeneXpert MRSA Assay (FDA approved for NASAL specimens only), is one component of a comprehensive MRSA colonization surveillance program. It is not intended to diagnose MRSA infection nor to guide or monitor treatment for MRSA infections.   Culture, blood (x 2)     Status: None (Preliminary result)   Collection Time: 08/21/16  1:47 AM  Result Value Ref Range Status   Specimen Description BLOOD LEFT ANTECUBITAL  Final   Special Requests IN PEDIATRIC BOTTLE 1.5CC  Final   Culture   Final    NO GROWTH 4 DAYS Performed at [facility]    Report Status PENDING  Incomplete  Culture, sputum-assessment     Status: None   Collection Time: 08/21/16  2:46 AM  Result Value Ref Range Status   Specimen Description SPUTUM  Final   Special Requests Normal  Final   Sputum evaluation   Final    Sputum specimen not acceptable for testing.  Please recollect.   RESULT CALLED TO, READ BACK BY AND VERIFIED WITH: R.MCINTOSH,RN  0320 08/21/16 W.SHEA    Report Status 08/21/2016 FINAL  Final  Culture, expectorated sputum-assessment     Status: None   Collection Time: 08/21/16  8:07 AM  Result Value Ref Range Status   Specimen Description SPUTUM  Final   Special Requests Normal  Final   Sputum evaluation   Final    THIS SPECIMEN IS ACCEPTABLE. RESPIRATORY CULTURE REPORT TO FOLLOW.   Report Status 08/21/2016 FINAL  Final  Culture, respiratory (NON-Expectorated)     Status: None   Collection Time: 08/21/16  8:07 AM  Result Value Ref Range Status   Specimen Description SPUTUM  Final   Special Requests NONE  Final   Gram Stain   Final    NO WBC SEEN FEW SQUAMOUS EPITHELIAL CELLS PRESENT FEW GRAM POSITIVE RODS FEW GRAM POSITIVE COCCI IN PAIRS IN CLUSTERS FEW GRAM NEGATIVE RODS    Culture   Final    Consistent with normal respiratory flora. Performed at [facility]    Report Status 08/23/2016 FINAL  Final  MRSA PCR Screening     Status: None   Collection Time: 08/23/16  9:16 AM  Result Value Ref Range Status   MRSA by PCR NEGATIVE NEGATIVE Final    Comment:        The GeneXpert MRSA Assay (FDA approved for NASAL specimens only), is one  component of a comprehensive MRSA colonization surveillance program. It is not intended to diagnose MRSA infection nor to guide or monitor treatment for MRSA infections.       Radiology Studies: Dg Chest 2 View Result Date: 08/21/2016 Mild vascular congestion. Mild right basilar airspace opacity may reflect pneumonia or mild interstitial edema.   Ct Abdomen Pelvis W Contrast Result Date: 08/20/2016 1. Hepatic steatosis without focal lesion. 2. Right lower lobe lung base opacities, suggesting aspiration or pneumonia.     Scheduled Meds: . cloNIDine  0.1 mg Transdermal Weekly  . dextrose  1 Tube Oral Once  . diazepam  2 mg Oral Q12H  . enoxaparin (LOVENOX) injection  40 mg Subcutaneous Q24H  . folic acid  1 mg Oral Daily  . labetalol  100 mg Oral BID   . levofloxacin  750 mg Oral Q24H  . magnesium sulfate 1 - 4 g bolus IVPB  2 g Intravenous Once  . multivitamin with minerals  1 tablet Oral Daily  . nicotine  21 mg Transdermal Daily  . pantoprazole  40 mg Oral BID  . potassium chloride  40 mEq Oral Once  . sodium chloride  1,000 mL Intravenous Once   And  . sodium chloride  1,000 mL Intravenous Once  . sodium chloride flush  3 mL Intravenous Q12H   Continuous Infusions: . dextrose 5 % and 0.9% NaCl 50 mL/hr at 08/26/16 0617     LOS: 5 days    Time spent: 25 minutes  Greater than 50% of the time spent on counseling and coordinating the care.   Leisa Lenz, MD [facility] Pager [phone]  If 7PM-7AM, please contact night-coverage [email or web address] Password [email or web address] 08/26/2016, 10:10 AM

## 2016-08-27 LAB — BASIC METABOLIC PANEL
ANION GAP: 5 (ref 5–15)
BUN: 8 mg/dL (ref 6–20)
CALCIUM: 8.8 mg/dL — AB (ref 8.9–10.3)
CO2: 23 mmol/L (ref 22–32)
Chloride: 106 mmol/L (ref 101–111)
Creatinine, Ser: 0.87 mg/dL (ref 0.61–1.24)
GFR calc Af Amer: >60 mL/min (ref >60)
GLUCOSE: 94 mg/dL (ref 65–99)
Potassium: 4 mmol/L (ref 3.5–5.1)
SODIUM: 134 mmol/L — AB (ref 135–145)

## 2016-08-27 LAB — CBC WITH DIFFERENTIAL/PLATELET
BASOS PCT: 1 %
Basophils Absolute: 0 10*3/uL (ref 0.0–0.1)
EOS ABS: 0.1 10*3/uL (ref 0.0–0.7)
EOS PCT: 3 %
HCT: 34.1 % — ABNORMAL LOW (ref 39.0–52.0)
Hemoglobin: 11.5 g/dL — ABNORMAL LOW (ref 13.0–17.0)
LYMPHS ABS: 1.2 10*3/uL (ref 0.7–4.0)
Lymphocytes Relative: 30 %
MCH: 33.1 pg (ref 26.0–34.0)
MCHC: 33.7 g/dL (ref 30.0–36.0)
MCV: 98.3 fL (ref 78.0–100.0)
Monocytes Absolute: 0.9 10*3/uL (ref 0.1–1.0)
Monocytes Relative: 22 %
Neutro Abs: 1.8 10*3/uL (ref 1.7–7.7)
Neutrophils Relative %: 44 %
PLATELETS: 188 10*3/uL (ref 150–400)
RBC: 3.47 MIL/uL — AB (ref 4.22–5.81)
RDW: 13.1 % (ref 11.5–15.5)
WBC: 4 10*3/uL (ref 4.0–10.5)

## 2016-08-27 LAB — RENAL FUNCTION PANEL
ALBUMIN: 3.2 g/dL — AB (ref 3.5–5.0)
Anion gap: 7 (ref 5–15)
BUN: 8 mg/dL (ref 6–20)
CALCIUM: 8.8 mg/dL — AB (ref 8.9–10.3)
CHLORIDE: 106 mmol/L (ref 101–111)
CO2: 21 mmol/L — ABNORMAL LOW (ref 22–32)
CREATININE: 0.91 mg/dL (ref 0.61–1.24)
Glucose, Bld: 96 mg/dL (ref 65–99)
Phosphorus: 3.7 mg/dL (ref 2.5–4.6)
Potassium: 4 mmol/L (ref 3.5–5.1)
SODIUM: 134 mmol/L — AB (ref 135–145)

## 2016-08-27 LAB — MAGNESIUM: MAGNESIUM: 1.8 mg/dL (ref 1.7–2.4)

## 2016-08-27 MED ORDER — FOLIC ACID 1 MG PO TABS: 1.0000 mg | ORAL_TABLET | Freq: Every day | ORAL | 0 refills | Status: DC

## 2016-08-27 MED ORDER — LABETALOL HCL 100 MG PO TABS: 100.0000 mg | ORAL_TABLET | Freq: Two times a day (BID) | ORAL | 0 refills | Status: DC

## 2016-08-27 NOTE — Discharge Summary (Signed)
Physician Discharge Summary  Richard [name] [id number] DOB: [birth year] DOA: 08/20/2016  PCP: Pcp Not In System  Admit date: 08/20/2016 Discharge date: 08/27/2016  Recommendations for Outpatient Follow-up:  1. Continue labetalol on discharge. Continue folic acid on discharge.  Discharge Diagnoses:  Principal Problem:   Alcohol abuse Active Problems:   Alcohol withdrawal (HCC)   Dehydration   Intractable nausea and vomiting   Lactic acidosis   Aspiration pneumonia (HCC)   Sepsis (HCC)   Hepatic steatosis   Thrombocytopenia (HCC)    Discharge Condition: stable   Diet recommendation: as tolerated   History of present illness:  50 y.o.male with known history of chronic alcohol abuse and history of colon cancer status post resection. Patient presented with a 2 day history of abdominal pain, nausea, and vomiting. Pt was noted to have significant acidosis and elevated lactic acid on admission. Patient placed on CIWA protocol. Due to patient's ongoing combative nature and altered mental status he was transferred to the ICU for further treatment and has required precedex drip. In addition, he is on levaquin for aspiration pneumonia.     Hospital Course:    Assessment & Plan:   Principal Problem: Acute metabolic encephalopathy / acute alcohol intoxication / alcohol withdrawal delirium - Alcohol level 103 on admission  - Patient has required Precedex drip which now has been stopped 08/25/2016 - No reports of intervals - We will stop clonidine patch - Patient is medically stable and mental status is at baseline, alert and oriented to time, place and person  Active Problems:   Sepsis secondary to aspiration pneumonia ([place]) /  Lactic acidosis / Intractable nausea and vomiting / Leukopenia  - RLL infiltrate - SLAP Levaquin today - Blood cx so far negative    Thrombocytopenia (HCC) - Likely bone marrow suppression from history of alcohol abuse - Platelet count WNL      Depression / Suicidal ideations - Sitter at bedside - Psych consulted - patient clear for outpatient follow-up, does not need sitter at bedside and does not need inpatient behavioral health placement    Hypokalemia - Likely secondary to history of alcohol abuse, sepsis - Supplemented potassium and magnesium and is now within normal limits    Essential hypertension / sinus tachycardia - Patient was tachycardic which improved with Cardizem drip. Cardizem drip stopped 08/24/2016 - HR and BP normalized with labetalol 100 mg Q12 hours  Patient will continue labetalol on discharge-     Tobacco abuse - Counseled on smoking cessation - Nicotine patch in hospital    DVT prophylaxis: Lovenox subcutaneous Code Status: full code  Family Communication: daughter at the bedside    Consultants:   PCCM initially attending and [place] assumed car as of 08/25/2016  Psych  Procedures:   None   Antimicrobials:   Levaquin     Signed:  Leisa Lenz, MD  [facility] 08/27/2016, 9:37 AM  Pager #: [phone]  Time spent in minutes: less than 30 minutes   Discharge Exam: Vitals:   08/26/16 2100 08/27/16 0424  BP: (!) 153/91 (!) 144/85  Pulse: 84 70  Resp: 20 16  Temp: 98.8 F (37.1 C) 98.7 F (37.1 C)   Vitals:   08/26/16 0436 08/26/16 1324 08/26/16 2100 08/27/16 0424  BP: 135/86 (!) 150/87 (!) 153/91 (!) 144/85  Pulse: 80 84 84 70  Resp: 16 18 20 16   Temp: 98.2 F (36.8 C) 98.3 F (36.8 C) 98.8 F (37.1 C) 98.7 F (37.1 C)  TempSrc: Oral Oral  Oral Oral  SpO2: 100% 100% 100% 100%  Weight:      Height:        General: Pt is alert, follows commands appropriately, not in acute distress Cardiovascular: Regular rate and rhythm, S1/S2 +, no murmurs Respiratory: Clear to auscultation bilaterally, no wheezing, no crackles, no rhonchi Abdominal: Soft, non tender, non distended, bowel sounds +, no guarding Extremities: no edema, no cyanosis, pulses  palpable bilaterally DP and PT Neuro: Grossly nonfocal  Discharge Instructions  Discharge Instructions    Call MD for:  persistant nausea and vomiting    Complete by:  As directed    Call MD for:  redness, tenderness, or signs of infection (pain, swelling, redness, odor or green/yellow discharge around incision site)    Complete by:  As directed    Call MD for:  severe uncontrolled pain    Complete by:  As directed    Diet - low sodium heart healthy    Complete by:  As directed    Discharge instructions    Complete by:  As directed    Continue labetalol on discharge for blood pressure control Continue folic acid   Increase activity slowly    Complete by:  As directed        Medication List    TAKE these medications   esomeprazole 40 MG capsule Commonly known as:  NEXIUM Take 40 mg by mouth daily at 12 noon.   folic acid 1 MG tablet Commonly known as:  FOLVITE Take 1 tablet (1 mg total) by mouth daily.   labetalol 100 MG tablet Commonly known as:  NORMODYNE Take 1 tablet (100 mg total) by mouth 2 (two) times daily.         The results of significant diagnostics from this hospitalization (including imaging, microbiology, ancillary and laboratory) are listed below for reference.    Significant Diagnostic Studies: Dg Chest 2 View  Result Date: 08/21/2016 CLINICAL DATA:  Acute onset of generalized abdominal pain and nausea. Vomiting. Initial encounter. EXAM: CHEST  2 VIEW COMPARISON:  Chest radiograph performed 10/23/2013 FINDINGS: The lungs are well-aerated. Mild vascular congestion is noted. Mild right basilar airspace opacity may reflect pneumonia or mild interstitial edema. There is no evidence of pleural effusion or pneumothorax. The heart is normal in size; the mediastinal contour is within normal limits. No acute osseous abnormalities are seen. IMPRESSION: Mild vascular congestion. Mild right basilar airspace opacity may reflect pneumonia or mild interstitial edema.  Electronically Signed   By: Garald Balding M.D.   On: 08/21/2016 00:50   Ct Abdomen Pelvis W Contrast  Result Date: 08/20/2016 CLINICAL DATA:  Sudden onset of emesis, 2 days ago. Abdominal pain and diaphoresis. EXAM: CT ABDOMEN AND PELVIS WITH CONTRAST TECHNIQUE: Multidetector CT imaging of the abdomen and pelvis was performed using the standard protocol following bolus administration of intravenous contrast. CONTRAST:  158mL ISOVUE-300 IOPAMIDOL (ISOVUE-300) INJECTION 61% COMPARISON:  02/04/2013 FINDINGS: Lower chest: Right lower lobe opacities, tree-in-bud distribution. This could represent aspiration. Pneumonia not excluded. Hepatobiliary: Diffuse fatty infiltration of the liver without significant focal lesion. Gallbladder and bile ducts are unremarkable. Pancreas: Unremarkable. No pancreatic ductal dilatation or surrounding inflammatory changes. Spleen: Normal in size without focal abnormality. Adrenals/Urinary Tract: Adrenal glands are unremarkable. Kidneys are normal, without renal calculi, focal lesion, or hydronephrosis. Bladder is unremarkable. Stomach/Bowel: Stomach, small bowel and colon are unremarkable. No bowel obstruction. No bowel inflammation. No extraluminal air. Vascular/Lymphatic: The abdominal aorta is normal in caliber with mild atherosclerotic calcification. No  pathologic adenopathy is evident in the abdomen or pelvis. Reproductive: Unremarkable Other: No ascites. Musculoskeletal: No acute or significant osseous findings. IMPRESSION: 1. Hepatic steatosis without focal lesion. 2. Right lower lobe lung base opacities, suggesting aspiration or pneumonia. Electronically Signed   By: Andreas Newport M.D.   On: 08/20/2016 23:01    Microbiology: Recent Results (from the past 240 hour(s))  Culture, blood (x 2)     Status: None   Collection Time: 08/21/16 12:21 AM  Result Value Ref Range Status   Specimen Description BLOOD BLOOD RIGHT FOREARM  Final   Special Requests BOTTLES DRAWN  AEROBIC AND ANAEROBIC 5CC EACH  Final   Culture   Final    NO GROWTH 5 DAYS Performed at [facility]    Report Status 08/26/2016 FINAL  Final  MRSA PCR Screening     Status: None   Collection Time: 08/21/16  1:00 AM  Result Value Ref Range Status   MRSA by PCR NEGATIVE NEGATIVE Final    Comment:        The GeneXpert MRSA Assay (FDA approved for NASAL specimens only), is one component of a comprehensive MRSA colonization surveillance program. It is not intended to diagnose MRSA infection nor to guide or monitor treatment for MRSA infections.   Culture, blood (x 2)     Status: None   Collection Time: 08/21/16  1:47 AM  Result Value Ref Range Status   Specimen Description BLOOD LEFT ANTECUBITAL  Final   Special Requests IN PEDIATRIC BOTTLE 1.5CC  Final   Culture   Final    NO GROWTH 5 DAYS Performed at [facility]    Report Status 08/26/2016 FINAL  Final  Culture, sputum-assessment     Status: None   Collection Time: 08/21/16  2:46 AM  Result Value Ref Range Status   Specimen Description SPUTUM  Final   Special Requests Normal  Final   Sputum evaluation   Final    Sputum specimen not acceptable for testing.  Please recollect.   RESULT CALLED TO, READ BACK BY AND VERIFIED WITH: R.MCINTOSH,RN 0320 08/21/16 W.SHEA    Report Status 08/21/2016 FINAL  Final  Culture, expectorated sputum-assessment     Status: None   Collection Time: 08/21/16  8:07 AM  Result Value Ref Range Status   Specimen Description SPUTUM  Final   Special Requests Normal  Final   Sputum evaluation   Final    THIS SPECIMEN IS ACCEPTABLE. RESPIRATORY CULTURE REPORT TO FOLLOW.   Report Status 08/21/2016 FINAL  Final  Culture, respiratory (NON-Expectorated)     Status: None   Collection Time: 08/21/16  8:07 AM  Result Value Ref Range Status   Specimen Description SPUTUM  Final   Special Requests NONE  Final   Gram Stain   Final    NO WBC SEEN FEW SQUAMOUS EPITHELIAL CELLS PRESENT FEW  GRAM POSITIVE RODS FEW GRAM POSITIVE COCCI IN PAIRS IN CLUSTERS FEW GRAM NEGATIVE RODS    Culture   Final    Consistent with normal respiratory flora. Performed at [facility]    Report Status 08/23/2016 FINAL  Final  MRSA PCR Screening     Status: None   Collection Time: 08/23/16  9:16 AM  Result Value Ref Range Status   MRSA by PCR NEGATIVE NEGATIVE Final    Comment:        The GeneXpert MRSA Assay (FDA approved for NASAL specimens only), is one component of a comprehensive MRSA colonization surveillance  program. It is not intended to diagnose MRSA infection nor to guide or monitor treatment for MRSA infections.      Labs: Basic Metabolic Panel:  Recent Labs Lab 08/21/16 0026  08/21/16 0512  08/23/16 0511 08/24/16 0317 08/25/16 0320 08/26/16 0409 08/27/16 0530  NA  --   < > 131*  < > 135 137 135 135  134* 134*  134*  K  --   < > 4.5  < > 4.5 3.6 3.3* 3.3*  3.3* 4.0  4.0  CL  --   < > 104  < > 101 109 107 109  108 106  106  CO2  --   < > 17*  < > 26 21* 22 21*  21* 21*  23  GLUCOSE  --   < > 69  < > 104* 124* 97 103*  104* 96  94  BUN  --   < > 9  < > <5* 5* 7 8  8 8  8   CREATININE  --   < > 0.84  < > 0.63 0.70 0.81 0.73  0.72 0.91  0.87  CALCIUM  --   < > 7.9*  < > 10.3 9.8 9.1 8.8*  8.8* 8.8*  8.8*  MG 1.9  --  1.8  --   --   --  1.6* 1.5* 1.8  PHOS 2.6  --  2.1*  --   --   --  3.3 4.1 3.7  < > = values in this interval not displayed. Liver Function Tests:  Recent Labs Lab 08/20/16 1920 08/21/16 0512 08/25/16 0320 08/26/16 0409 08/27/16 0530  AST 200* 88*  --   --   --   ALT 53 37  --   --   --   ALKPHOS 79 49  --   --   --   BILITOT 1.7* 1.6*  --   --   --   PROT 9.0* 6.7  --   --   --   ALBUMIN 5.0 3.7 3.3* 3.1* 3.2*    Recent Labs Lab 08/20/16 1920  LIPASE 38   No results for input(s): AMMONIA in the last 168 hours. CBC:  Recent Labs Lab 08/23/16 0511 08/24/16 0317 08/25/16 0320 08/26/16 0409 08/27/16 0530   WBC 3.6* 3.1* 3.7* 4.3 4.0  NEUTROABS  --   --  1.8 2.2 1.8  HGB 14.3 14.2 12.5* 11.3* 11.5*  HCT 41.7 40.7 35.6* 32.8* 34.1*  MCV 96.8 96.2 97.0 97.6 98.3  PLT 119* 126* 135* 152 188   Cardiac Enzymes:  Recent Labs Lab 08/21/16 0026  CKTOTAL 657*   BNP: BNP (last 3 results) No results for input(s): BNP in the last 8760 hours.  ProBNP (last 3 results) No results for input(s): PROBNP in the last 8760 hours.  CBG:  Recent Labs Lab 08/21/16 1632 08/21/16 2024 08/21/16 2243 08/22/16 0047 08/22/16 0258  GLUCAP 87 131* 101* 108* 118*

## 2016-08-27 NOTE — Discharge Instructions (Signed)
Labetalol tablets °What is this medicine? °LABETALOL (la BET a lole) is a beta-blocker. Beta-blockers reduce the workload on the heart and help it to beat more regularly. This medicine is used to treat high blood pressure. °This medicine may be used for other purposes; ask your health care provider or pharmacist if you have questions. °COMMON BRAND NAME(S): Normodyne, Trandate °What should I tell my health care provider before I take this medicine? °They need to know if you have any of these conditions: °-diabetes °-history of heart attack, heart disease or heart failure °-kidney disease °-liver disease °-lung or breathing disease, like asthma or emphysema °-pheochromocytoma °-thyroid disease °-an unusual or allergic reaction to labetalol, other beta-blockers, medicines, foods, dyes, or preservatives °-pregnant or trying to get pregnant °-breast-feeding °How should I use this medicine? °Take this medicine by mouth with a glass of water. Follow the directions on the prescription label. Take your doses at regular intervals. Do not take your medicine more often than directed. Do not stop taking this medicine suddenly. This could lead to serious heart-related effects. °Talk to your pediatrician regarding the use of this medicine in children. Special care may be needed. °Overdosage: If you think you have taken too much of this medicine contact a poison control center or emergency room at once. °NOTE: This medicine is only for you. Do not share this medicine with others. °What if I miss a dose? °If you miss a dose, take it as soon as you can. If it is almost time for your next dose, take only that dose. Do not take double or extra doses. °What may interact with this medicine? °This medicine also interact with the following medications: °-certain medicines for blood pressure, heart disease, irregular heart beat °-cimetidine °-general anesthetics °-medicines for asthma or lung disease like albuterol °-medicines for  depression °-nitroglycerin °This list may not describe all possible interactions. Give your health care provider a list of all the medicines, herbs, non-prescription drugs, or dietary supplements you use. Also tell them if you smoke, drink alcohol, or use illegal drugs. Some items may interact with your medicine. °What should I watch for while using this medicine? °Visit your doctor or health care professional for regular check ups. Check your blood pressure and pulse rate regularly. Ask your health care professional what your blood pressure and pulse rate should be, and when you should contact him or her. °You may get drowsy or dizzy. Do not drive, use machinery, or do anything that needs mental alertness until you know how this medicine affects you. Do not stand or sit up quickly. Alcohol may interfere with the effect of this medicine. Avoid alcoholic drinks. °This medicine can affect blood sugar levels. If you have diabetes, check with your doctor or health care professional before you change your diet or the dose of your diabetic medicine. °Do not treat yourself for coughs, colds, or pain while you are taking this medicine without asking your doctor or health care professional for advice. Some ingredients may increase your blood pressure. °What side effects may I notice from receiving this medicine? °Side effects that you should report to your doctor or health care professional as soon as possible: °-allergic reactions like skin rash, itching or hives, swelling of the face, lips, or tongue °-breathing problems °-cold hands or feet °-dark urine °-depression °-general ill feeling or flu-like symptoms °-irregular heartbeat °-light-colored stools °-loss of appetite, nausea °-pain or trouble passing urine °-right upper belly pain °-slow heart rate (fewer than recommended by your   doctor or health care professional) -swollen legs or ankles -tingling of the scalp or skin -unusually weak or  tired -vomiting -yellowing of the eyes or skin Side effects that usually do not require medical attention (report to your doctor or health care professional if they continue or are bothersome): -decreased sexual function or desire -dry itching skin -headache -tiredness This list may not describe all possible side effects. Call your doctor for medical advice about side effects. You may report side effects to FDA at 1-800-FDA-1088. Where should I keep my medicine? Keep out of the reach of children. Store at room temperature between 15 and 30 degrees C (59 and 86 degrees F). Protect from light. Keep container tightly closed. Throw away any unused medicine after the expiration date. NOTE: This sheet is a summary. It may not cover all possible information. If you have questions about this medicine, talk to your doctor, pharmacist, or health care provider.  2017 Elsevier/Gold Standard (2013-05-24 14:34:23)

## 2016-08-27 NOTE — Care Management Note (Signed)
Case Management Note  Patient Details  Name: Richard [name] MRN: [id number] Date of Birth: May 31, 1966  Subjective/Objective:   ETOH detox               Action/Plan: Discharge Planning: AVS reviewed: NCM spoke to pt and provided pt with list of clinics that accept self-pay patients. Provided contact information of [name] also to call to arrange follow up appointment. Pt is familiar with [facility] and orange card and plans to apply. Provided pt with goodrx coupon for [facility] for $15.55. Pt states he can afford medication.    Expected Discharge Date:  08/27/2016                Expected Discharge Plan:  Home/Self Care  In-House Referral:  Clinical Social Work  Discharge planning Services  CM Consult  Post Acute Care Choice:  NA Choice offered to:  NA  DME Arranged:  N/A DME Agency:  NA  HH Arranged:  NA HH Agency:  NA  Status of Service:  Completed, signed off  If discussed at [employer] of Stay Meetings, dates discussed:    Additional Comments:  Erenest Rasher, RN 08/27/2016, 11:35 AM

## 2016-08-27 NOTE — Progress Notes (Signed)
Patient discharged home as ordered. Cleared by psych and suicide precautions D/C'd. IVC paperwork rescinded and faxed to magistrate. Home Nexium medication returned to patient from pharmacy. Patient left with sister and family, all belongings returned.

## 2017-03-24 ENCOUNTER — Emergency Department (HOSPITAL_COMMUNITY)
Admission: EM | Admit: 2017-03-24 | Discharge: 2017-03-25 | Disposition: A | Discharge status: Home / Self Care | Payer: Self-pay | Attending: Emergency Medicine | Admitting: Emergency Medicine

## 2017-03-24 ENCOUNTER — Emergency Department (HOSPITAL_COMMUNITY): Payer: Self-pay

## 2017-03-24 ENCOUNTER — Encounter (HOSPITAL_COMMUNITY): Payer: Self-pay | Admitting: Nurse Practitioner

## 2017-03-24 DIAGNOSIS — Z85038 Personal history of other malignant neoplasm of large intestine: Secondary | ICD-10-CM | POA: Insufficient documentation

## 2017-03-24 DIAGNOSIS — R1084 Generalized abdominal pain: Secondary | ICD-10-CM | POA: Insufficient documentation

## 2017-03-24 DIAGNOSIS — F17200 Nicotine dependence, unspecified, uncomplicated: Secondary | ICD-10-CM | POA: Insufficient documentation

## 2017-03-24 DIAGNOSIS — Z79899 Other long term (current) drug therapy: Secondary | ICD-10-CM | POA: Insufficient documentation

## 2017-03-24 DIAGNOSIS — R109 Unspecified abdominal pain: Secondary | ICD-10-CM

## 2017-03-24 LAB — COMPREHENSIVE METABOLIC PANEL
ALBUMIN: 4.4 g/dL (ref 3.5–5.0)
ALT: 38 U/L (ref 17–63)
ANION GAP: 24 — AB (ref 5–15)
AST: 104 U/L — ABNORMAL HIGH (ref 15–41)
Alkaline Phosphatase: 83 U/L (ref 38–126)
BUN: 11 mg/dL (ref 6–20)
CHLORIDE: 107 mmol/L (ref 101–111)
CO2: 9 mmol/L — AB (ref 22–32)
Calcium: 9.2 mg/dL (ref 8.9–10.3)
Creatinine, Ser: 0.99 mg/dL (ref 0.61–1.24)
GFR calc Af Amer: >60 mL/min (ref >60)
GFR calc non Af Amer: >60 mL/min (ref >60)
GLUCOSE: 65 mg/dL (ref 65–99)
POTASSIUM: 4.6 mmol/L (ref 3.5–5.1)
SODIUM: 140 mmol/L (ref 135–145)
TOTAL PROTEIN: 8.2 g/dL — AB (ref 6.5–8.1)
Total Bilirubin: 1.4 mg/dL — ABNORMAL HIGH (ref 0.3–1.2)

## 2017-03-24 LAB — URINALYSIS, ROUTINE W REFLEX MICROSCOPIC
BACTERIA UA: NONE SEEN
BILIRUBIN URINE: NEGATIVE
Glucose, UA: NEGATIVE mg/dL
KETONES UR: 80 mg/dL — AB
LEUKOCYTES UA: NEGATIVE
Nitrite: NEGATIVE
Protein, ur: 100 mg/dL — AB
SPECIFIC GRAVITY, URINE: 1.015 (ref 1.005–1.030)
SQUAMOUS EPITHELIAL / LPF: NONE SEEN
pH: 5 (ref 5.0–8.0)

## 2017-03-24 LAB — CBC WITH DIFFERENTIAL/PLATELET
BASOS PCT: 0 %
Basophils Absolute: 0 10*3/uL (ref 0.0–0.1)
EOS ABS: 0 10*3/uL (ref 0.0–0.7)
Eosinophils Relative: 0 %
HCT: 44.3 % (ref 39.0–52.0)
HEMOGLOBIN: 15 g/dL (ref 13.0–17.0)
Lymphocytes Relative: 9 %
Lymphs Abs: 1.2 10*3/uL (ref 0.7–4.0)
MCH: 35 pg — ABNORMAL HIGH (ref 26.0–34.0)
MCHC: 33.9 g/dL (ref 30.0–36.0)
MCV: 103.3 fL — ABNORMAL HIGH (ref 78.0–100.0)
Monocytes Absolute: 0.7 10*3/uL (ref 0.1–1.0)
Monocytes Relative: 5 %
NEUTROS ABS: 10.9 10*3/uL — AB (ref 1.7–7.7)
NEUTROS PCT: 86 %
Platelets: 182 10*3/uL (ref 150–400)
RBC: 4.29 MIL/uL (ref 4.22–5.81)
RDW: 14.1 % (ref 11.5–15.5)
WBC: 12.7 10*3/uL — ABNORMAL HIGH (ref 4.0–10.5)

## 2017-03-24 LAB — LIPASE, BLOOD: LIPASE: 74 U/L — AB (ref 11–51)

## 2017-03-24 LAB — I-STAT CG4 LACTIC ACID, ED: LACTIC ACID, VENOUS: 2.61 mmol/L — AB (ref 0.5–1.9)

## 2017-03-24 MED ORDER — SODIUM CHLORIDE 0.9 % IV BOLUS (SEPSIS)
1000.0000 mL | Freq: Once | INTRAVENOUS | Status: AC
  Administered 2017-03-24: 1000 mL via INTRAVENOUS

## 2017-03-24 MED ORDER — MORPHINE SULFATE (PF) 4 MG/ML IV SOLN
8.0000 mg | Freq: Once | INTRAVENOUS | Status: AC
  Administered 2017-03-24: 8 mg via INTRAVENOUS
  Filled 2017-03-24: qty 2

## 2017-03-24 MED ORDER — IOPAMIDOL (ISOVUE-300) INJECTION 61%
INTRAVENOUS | Status: AC
  Filled 2017-03-24: qty 100

## 2017-03-24 MED ORDER — ONDANSETRON HCL 4 MG/2ML IJ SOLN
4.0000 mg | Freq: Once | INTRAMUSCULAR | Status: AC
  Administered 2017-03-24: 4 mg via INTRAVENOUS
  Filled 2017-03-24: qty 2

## 2017-03-24 MED ORDER — IOPAMIDOL (ISOVUE-300) INJECTION 61%
100.0000 mL | Freq: Once | INTRAVENOUS | Status: AC | PRN
  Administered 2017-03-25: 100 mL via INTRAVENOUS

## 2017-03-24 MED ORDER — HYDROMORPHONE HCL 1 MG/ML IJ SOLN
1.0000 mg | Freq: Once | INTRAMUSCULAR | Status: AC
  Administered 2017-03-25: 1 mg via INTRAVENOUS
  Filled 2017-03-24: qty 1

## 2017-03-24 NOTE — ED Triage Notes (Signed)
Pt states that he "feels as if something burst out of his stomach." He adds that he has been constantly vomiting.

## 2017-03-24 NOTE — ED Notes (Signed)
Notified EDP,Nanavati,MD., pt. I-stat CG4 Lactic acid results 2.61 and RN,Lindsay made aware.

## 2017-03-25 ENCOUNTER — Encounter (HOSPITAL_COMMUNITY): Payer: Self-pay | Admitting: Radiology

## 2017-03-25 LAB — I-STAT CG4 LACTIC ACID, ED: LACTIC ACID, VENOUS: 5.37 mmol/L — AB (ref 0.5–1.9)

## 2017-03-25 MED ORDER — ONDANSETRON 8 MG PO TBDP
8.0000 mg | ORAL_TABLET | Freq: Three times a day (TID) | ORAL | 0 refills | Status: DC | PRN
Start: 2017-03-25 — End: 2018-06-28

## 2017-03-25 NOTE — ED Provider Notes (Signed)
[facility] DEPT Provider Note   CSN: [id number] Arrival date & time: 03/24/17  2013     History   Chief Complaint Chief Complaint  Patient presents with  . Abdominal Pain    HPI Richard [name] is a 51 y.o. male.  HPI Patient is a 51 year old male male with a history of intermittent abdominal pain.  He's has a history of colon cancer.  He reports that his abdominal pain began abruptly 48 hours ago with associated nausea vomiting and diarrhea.  He states decreased ability to keep fluids down.  No fevers or chills.  No hematemesis.  No melena or hematochezia.  No prior history of bowel obstruction.  He has had abdominal surgery before.  He does report flares of abdominal pain and nausea vomiting like this before in the past.  Usually once every 6 months.  He had a history of alcohol abuse   Past Medical History:  Diagnosis Date  . Acid reflux   . Colon cancer ([place])   . Colon cancer ([place])   . PUD (peptic ulcer disease)     Patient Active Problem List   Diagnosis Date Noted  . Hepatic steatosis 08/21/2016  . Thrombocytopenia ([place]) 08/21/2016  . Alcohol abuse 08/21/2016  . Alcohol withdrawal ([place]) 08/20/2016  . Dehydration 08/20/2016  . Intractable nausea and vomiting 08/20/2016  . Lactic acidosis 08/20/2016  . Aspiration pneumonia ([place]) 08/20/2016  . Sepsis ([place]) 08/20/2016    Past Surgical History:  Procedure Laterality Date  . COLON RESECTION         Home Medications    Prior to Admission medications   Medication Sig Start Date End Date Taking? Authorizing Provider  esomeprazole (NEXIUM) 40 MG capsule Take 40 mg by mouth daily at 12 noon.    [provider]  folic acid (FOLVITE) 1 MG tablet Take 1 tablet (1 mg total) by mouth daily. 08/27/16   [name], MD  labetalol (NORMODYNE) 100 MG tablet Take 1 tablet (100 mg total) by mouth 2 (two) times daily. 08/27/16   [name], MD  ondansetron (ZOFRAN ODT) 8 MG disintegrating tablet Take 1 tablet (8  mg total) by mouth every 8 (eight) hours as needed for nausea or vomiting. 03/25/17   Jola Schmidt, MD    Family History Family History  Problem Relation Age of Onset  . Colon cancer Father   . Cancer Sister   . CAD Neg Hx   . Stroke Neg Hx   . Diabetes Neg Hx     Social History Social History  Substance Use Topics  . Smoking status: Current Every Day Smoker    Packs/day: 1.00  . Smokeless tobacco: Never Used  . Alcohol use Yes     Comment: heavy alcohol abuse a beer and couple of shots a day for past  14 years     Allergies   Aspirin and Penicillins   Review of Systems Review of Systems  All other systems reviewed and are negative.    Physical Exam Updated Vital Signs BP (!) 141/76 (BP Location: Right Arm)   Pulse 73   Temp 98 F (36.7 C) (Oral)   Resp 14   SpO2 99%   Physical Exam  Constitutional: He is oriented to person, place, and time. He appears well-developed and well-nourished.  HENT:  Head: Normocephalic and atraumatic.  Eyes: EOM are normal.  Neck: Normal range of motion.  Cardiovascular: Normal rate, regular rhythm, normal heart sounds and intact distal pulses.  Pulmonary/Chest: Effort normal and breath sounds normal. No respiratory distress.  Abdominal: Soft. He exhibits no distension.  Mild generalized abdominal tenderness without guarding or rebound.  Musculoskeletal: Normal range of motion.  Neurological: He is alert and oriented to person, place, and time.  Skin: Skin is warm and dry.  Psychiatric: He has a normal mood and affect. Judgment normal.  Nursing note and vitals reviewed.    ED Treatments / Results  Labs (all labs ordered are listed, but only abnormal results are displayed) Labs Reviewed  CBC WITH DIFFERENTIAL/PLATELET - Abnormal; Notable for the following:       Result Value   WBC 12.7 (*)    MCV 103.3 (*)    MCH 35.0 (*)    Neutro Abs 10.9 (*)    All other components within normal limits  LIPASE, BLOOD - Abnormal;  Notable for the following:    Lipase 74 (*)    All other components within normal limits  COMPREHENSIVE METABOLIC PANEL - Abnormal; Notable for the following:    CO2 9 (*)    Total Protein 8.2 (*)    AST 104 (*)    Total Bilirubin 1.4 (*)    Anion gap 24 (*)    All other components within normal limits  URINALYSIS, ROUTINE W REFLEX MICROSCOPIC - Abnormal; Notable for the following:    Hgb urine dipstick MODERATE (*)    Ketones, ur 80 (*)    Protein, ur 100 (*)    All other components within normal limits  I-STAT CG4 LACTIC ACID, ED - Abnormal; Notable for the following:    Lactic Acid, Venous 2.61 (*)    All other components within normal limits  I-STAT CG4 LACTIC ACID, ED - Abnormal; Notable for the following:    Lactic Acid, Venous 5.37 (*)    All other components within normal limits  URINE CULTURE    EKG  EKG Interpretation None       Radiology Ct Abdomen Pelvis W Contrast  Result Date: 03/25/2017 CLINICAL DATA:  Vomiting, abdominal pressure. History of colon cancer, peptic ulcer disease, alcohol abuse. EXAM: CT ABDOMEN AND PELVIS WITH CONTRAST TECHNIQUE: Multidetector CT imaging of the abdomen and pelvis was performed using the standard protocol following bolus administration of intravenous contrast. CONTRAST:  160mL ISOVUE-300 IOPAMIDOL (ISOVUE-300) INJECTION 61% COMPARISON:  CT abdomen and pelvis August 20, 2016 FINDINGS: LOWER CHEST: Bibasilar atelectasis. Included heart size is normal. No pericardial effusion. HEPATOBILIARY: The liver is diffusely hypodense compatible with steatosis. subcentimeter hypodensity segment 4 compatible with cysts versus hemangioma. Transient hepatic attenuation difference about the gallbladder fossa. Normal gallbladder. PANCREAS: Normal. SPLEEN: Normal. ADRENALS/URINARY TRACT: Kidneys are orthotopic, demonstrating symmetric enhancement. No nephrolithiasis, hydronephrosis or solid renal masses. The unopacified ureters are normal in course and  caliber. Delayed imaging through the kidneys demonstrates symmetric prompt contrast excretion within the proximal urinary collecting system. Urinary bladder is partially distended and unremarkable. Normal adrenal glands. STOMACH/BOWEL: The stomach, small bowel are normal in course and caliber without inflammatory changes, limited assessment without enteric contrast. Subtotal colectomy. Mild RIGHT colon wall thickening, however no pericolonic inflammation. VASCULAR/LYMPHATIC: Aortoiliac vessels are normal in course and caliber, mild to moderate calcific atherosclerosis. No lymphadenopathy by CT size criteria. REPRODUCTIVE: Penile calcifications are likely vascular though, central punctate shaft calcification. No prostatomegaly. OTHER: No intraperitoneal free fluid or free air. MUSCULOSKELETAL: Nonacute. IMPRESSION: Mild suspected colitis, limited assessment due to decompressed bowel. Status post subtotal colectomy. Severe hepatic steatosis. Penile calcifications, possible potential urolithiasis. Electronically Signed  By: Elon Alas M.D.   On: 03/25/2017 00:28    Procedures Procedures (including critical care time)  Medications Ordered in ED Medications  iopamidol (ISOVUE-300) 61 % injection (not administered)  sodium chloride 0.9 % bolus 1,000 mL (0 mLs Intravenous Stopped 03/24/17 2320)  ondansetron (ZOFRAN) injection 4 mg (4 mg Intravenous Given 03/24/17 2217)  morphine 4 MG/ML injection 8 mg (8 mg Intravenous Given 03/24/17 2217)  HYDROmorphone (DILAUDID) injection 1 mg (1 mg Intravenous Given 03/25/17 0003)  iopamidol (ISOVUE-300) 61 % injection 100 mL (100 mLs Intravenous Contrast Given 03/25/17 0007)     Initial Impression / Assessment and Plan / ED Course  I have reviewed the triage vital signs and the nursing notes.  Pertinent labs & imaging results that were available during my care of the patient were reviewed by me and considered in my medical decision making (see chart for  details).     Patient dehydrated on arrival as evident by his CO2 and his lactate.  He's given IV fluids.  He was treated symptomatically.  CT abdomen pelvis without pathology.  He feels much better at this time.  He's keeping oral fluids down.  Outpatient GI follow-up.  Patient has a gastroenterologist.  He understands return to the ER for new or worsening symptoms.  Home with Zofran.  Final Clinical Impressions(s) / ED Diagnoses   Final diagnoses:  Abdominal pain, unspecified abdominal location    New Prescriptions New Prescriptions   ONDANSETRON (ZOFRAN ODT) 8 MG DISINTEGRATING TABLET    Take 1 tablet (8 mg total) by mouth every 8 (eight) hours as needed for nausea or vomiting.     Jola Schmidt, MD 03/25/17 [phone]

## 2017-03-26 LAB — URINE CULTURE

## 2017-11-12 ENCOUNTER — Encounter (HOSPITAL_COMMUNITY): Payer: Self-pay | Admitting: *Deleted

## 2017-11-12 DIAGNOSIS — K2920 Alcoholic gastritis without bleeding: Secondary | ICD-10-CM | POA: Diagnosis present

## 2017-11-12 DIAGNOSIS — F1010 Alcohol abuse, uncomplicated: Secondary | ICD-10-CM | POA: Diagnosis present

## 2017-11-12 DIAGNOSIS — Z85038 Personal history of other malignant neoplasm of large intestine: Secondary | ICD-10-CM

## 2017-11-12 DIAGNOSIS — Z880 Allergy status to penicillin: Secondary | ICD-10-CM

## 2017-11-12 DIAGNOSIS — E872 Acidosis: Principal | ICD-10-CM | POA: Diagnosis present

## 2017-11-12 DIAGNOSIS — Z23 Encounter for immunization: Secondary | ICD-10-CM

## 2017-11-12 DIAGNOSIS — K219 Gastro-esophageal reflux disease without esophagitis: Secondary | ICD-10-CM | POA: Diagnosis present

## 2017-11-12 DIAGNOSIS — F17200 Nicotine dependence, unspecified, uncomplicated: Secondary | ICD-10-CM | POA: Diagnosis present

## 2017-11-12 DIAGNOSIS — Z886 Allergy status to analgesic agent status: Secondary | ICD-10-CM

## 2017-11-12 DIAGNOSIS — Z8711 Personal history of peptic ulcer disease: Secondary | ICD-10-CM

## 2017-11-12 DIAGNOSIS — R195 Other fecal abnormalities: Secondary | ICD-10-CM | POA: Diagnosis present

## 2017-11-12 DIAGNOSIS — Z79899 Other long term (current) drug therapy: Secondary | ICD-10-CM

## 2017-11-12 DIAGNOSIS — E860 Dehydration: Secondary | ICD-10-CM | POA: Diagnosis present

## 2017-11-12 DIAGNOSIS — R739 Hyperglycemia, unspecified: Secondary | ICD-10-CM | POA: Diagnosis present

## 2017-11-12 LAB — COMPREHENSIVE METABOLIC PANEL
ALBUMIN: 4.2 g/dL (ref 3.5–5.0)
ALT: 41 U/L (ref 17–63)
AST: 124 U/L — ABNORMAL HIGH (ref 15–41)
Alkaline Phosphatase: 101 U/L (ref 38–126)
Anion gap: >20 — ABNORMAL HIGH (ref 5–15)
BUN: 8 mg/dL (ref 6–20)
CALCIUM: 8.9 mg/dL (ref 8.9–10.3)
CHLORIDE: 99 mmol/L — AB (ref 101–111)
CO2: 11 mmol/L — AB (ref 22–32)
CREATININE: 0.78 mg/dL (ref 0.61–1.24)
GFR calc Af Amer: >60 mL/min (ref >60)
GFR calc non Af Amer: >60 mL/min (ref >60)
GLUCOSE: 52 mg/dL — AB (ref 65–99)
Potassium: 3.6 mmol/L (ref 3.5–5.1)
SODIUM: 136 mmol/L (ref 135–145)
Total Bilirubin: 1.4 mg/dL — ABNORMAL HIGH (ref 0.3–1.2)
Total Protein: 8.2 g/dL — ABNORMAL HIGH (ref 6.5–8.1)

## 2017-11-12 LAB — CBC
HCT: 43.3 % (ref 39.0–52.0)
HEMOGLOBIN: 14.7 g/dL (ref 13.0–17.0)
MCH: 35.9 pg — AB (ref 26.0–34.0)
MCHC: 33.9 g/dL (ref 30.0–36.0)
MCV: 105.9 fL — AB (ref 78.0–100.0)
PLATELETS: 238 10*3/uL (ref 150–400)
RBC: 4.09 MIL/uL — ABNORMAL LOW (ref 4.22–5.81)
RDW: 13.5 % (ref 11.5–15.5)
WBC: 13.4 10*3/uL — ABNORMAL HIGH (ref 4.0–10.5)

## 2017-11-12 LAB — CBG MONITORING, ED: GLUCOSE-CAPILLARY: 50 mg/dL — AB (ref 65–99)

## 2017-11-12 LAB — LIPASE, BLOOD: LIPASE: 49 U/L (ref 11–51)

## 2017-11-12 MED ORDER — ONDANSETRON HCL 4 MG/2ML IJ SOLN
4.0000 mg | Freq: Once | INTRAMUSCULAR | Status: AC
  Administered 2017-11-13: 4 mg via INTRAVENOUS
  Filled 2017-11-12: qty 2

## 2017-11-12 MED ORDER — DEXTROSE 50 % IV SOLN
1.0000 | Freq: Once | INTRAVENOUS | Status: AC
Start: 2017-11-13 — End: 2017-11-13
  Administered 2017-11-13: 50 mL via INTRAVENOUS
  Filled 2017-11-12: qty 50

## 2017-11-12 NOTE — ED Triage Notes (Signed)
Pt reports that he woke up this morning with vomiting and right sided abdominal pain. Denies diarrhea.

## 2017-11-12 NOTE — ED Triage Notes (Signed)
Pt arrives via EMS, per report by medic, c/o abdominal pain and vomiting today. VSS

## 2017-11-12 NOTE — ED Notes (Signed)
Pt glucose noted to be 54 on lab draw. Brought pt back to triage, cbg 50. Spoke with dr. Wyvonnia Dusky. Attempted to give pt juice, pt now vomiting. Will give zofran and amp of dextrose.

## 2017-11-13 ENCOUNTER — Other Ambulatory Visit: Payer: Self-pay

## 2017-11-13 ENCOUNTER — Emergency Department (HOSPITAL_COMMUNITY): Payer: Self-pay

## 2017-11-13 ENCOUNTER — Inpatient Hospital Stay (HOSPITAL_COMMUNITY)
Admission: EM | Admit: 2017-11-13 | Discharge: 2017-11-15 | DRG: 641 | Discharge status: Against Medical Advice | Payer: Self-pay | Attending: Internal Medicine | Admitting: Internal Medicine

## 2017-11-13 DIAGNOSIS — E872 Acidosis: Principal | ICD-10-CM

## 2017-11-13 DIAGNOSIS — E8729 Other acidosis: Secondary | ICD-10-CM

## 2017-11-13 DIAGNOSIS — E860 Dehydration: Secondary | ICD-10-CM

## 2017-11-13 DIAGNOSIS — R195 Other fecal abnormalities: Secondary | ICD-10-CM

## 2017-11-13 DIAGNOSIS — R112 Nausea with vomiting, unspecified: Secondary | ICD-10-CM

## 2017-11-13 DIAGNOSIS — F1010 Alcohol abuse, uncomplicated: Secondary | ICD-10-CM

## 2017-11-13 DIAGNOSIS — E8889 Other specified metabolic disorders: Secondary | ICD-10-CM

## 2017-11-13 HISTORY — DX: Acidosis: E87.2

## 2017-11-13 HISTORY — DX: Other acidosis: E87.29

## 2017-11-13 HISTORY — DX: Other fecal abnormalities: R19.5

## 2017-11-13 LAB — URINALYSIS, ROUTINE W REFLEX MICROSCOPIC
BILIRUBIN URINE: NEGATIVE
Glucose, UA: NEGATIVE mg/dL
Ketones, ur: 80 mg/dL — AB
LEUKOCYTES UA: NEGATIVE
Nitrite: NEGATIVE
PH: 5 (ref 5.0–8.0)
Protein, ur: 100 mg/dL — AB
SPECIFIC GRAVITY, URINE: 1.016 (ref 1.005–1.030)
Squamous Epithelial / LPF: NONE SEEN

## 2017-11-13 LAB — RAPID URINE DRUG SCREEN, HOSP PERFORMED
Amphetamines: NOT DETECTED
BARBITURATES: NOT DETECTED
Benzodiazepines: NOT DETECTED
COCAINE: NOT DETECTED
Opiates: NOT DETECTED
Tetrahydrocannabinol: POSITIVE — AB

## 2017-11-13 LAB — CBG MONITORING, ED: Glucose-Capillary: 168 mg/dL — ABNORMAL HIGH (ref 65–99)

## 2017-11-13 LAB — BASIC METABOLIC PANEL
Anion gap: 20 — ABNORMAL HIGH (ref 5–15)
Anion gap: 15 (ref 5–15)
BUN: 7 mg/dL (ref 6–20)
BUN: 7 mg/dL (ref 6–20)
CHLORIDE: 106 mmol/L (ref 101–111)
CHLORIDE: 102 mmol/L (ref 101–111)
CO2: 14 mmol/L — ABNORMAL LOW (ref 22–32)
CO2: 19 mmol/L — ABNORMAL LOW (ref 22–32)
CREATININE: 0.72 mg/dL (ref 0.61–1.24)
CREATININE: 0.78 mg/dL (ref 0.61–1.24)
Calcium: 8.2 mg/dL — ABNORMAL LOW (ref 8.9–10.3)
Calcium: 8.3 mg/dL — ABNORMAL LOW (ref 8.9–10.3)
GFR calc Af Amer: >60 mL/min (ref >60)
GFR calc Af Amer: >60 mL/min (ref >60)
GFR calc non Af Amer: >60 mL/min (ref >60)
GFR calc non Af Amer: >60 mL/min (ref >60)
GLUCOSE: 100 mg/dL — AB (ref 65–99)
Glucose, Bld: 76 mg/dL (ref 65–99)
Potassium: 3.7 mmol/L (ref 3.5–5.1)
Potassium: 3.6 mmol/L (ref 3.5–5.1)
SODIUM: 140 mmol/L (ref 135–145)
SODIUM: 136 mmol/L (ref 135–145)

## 2017-11-13 LAB — BLOOD GAS, VENOUS
Acid-base deficit: 12.9 mmol/L — ABNORMAL HIGH (ref 0.0–2.0)
Bicarbonate: 12.9 mmol/L — ABNORMAL LOW (ref 20.0–28.0)
Drawn by: 51425
O2 SAT: 90.1 %
PATIENT TEMPERATURE: 98.6
PO2 VEN: 71.4 mmHg — AB (ref 32.0–45.0)
pCO2, Ven: 30.6 mmHg — ABNORMAL LOW (ref 44.0–60.0)
pH, Ven: 7.25 (ref 7.250–7.430)

## 2017-11-13 LAB — MAGNESIUM: MAGNESIUM: 1.6 mg/dL — AB (ref 1.7–2.4)

## 2017-11-13 LAB — ETHANOL: Alcohol, Ethyl (B): 32 mg/dL — ABNORMAL HIGH (ref <10)

## 2017-11-13 LAB — HIV ANTIBODY (ROUTINE TESTING W REFLEX): HIV Screen 4th Generation wRfx: NONREACTIVE

## 2017-11-13 LAB — I-STAT CG4 LACTIC ACID, ED: Lactic Acid, Venous: 0.7 mmol/L (ref 0.5–1.9)

## 2017-11-13 LAB — BETA-HYDROXYBUTYRIC ACID: Beta-Hydroxybutyric Acid: 6.88 mmol/L — ABNORMAL HIGH (ref 0.05–0.27)

## 2017-11-13 LAB — PHOSPHORUS: Phosphorus: 2.7 mg/dL (ref 2.5–4.6)

## 2017-11-13 MED ORDER — ACETAMINOPHEN 325 MG PO TABS
650.0000 mg | ORAL_TABLET | Freq: Four times a day (QID) | ORAL | Status: DC | PRN
  Administered 2017-11-13 – 2017-11-14 (×2): 650 mg via ORAL
  Filled 2017-11-13 (×2): qty 2

## 2017-11-13 MED ORDER — ADULT MULTIVITAMIN W/MINERALS CH
1.0000 | ORAL_TABLET | Freq: Every day | ORAL | Status: DC
  Administered 2017-11-13 – 2017-11-14 (×2): 1 via ORAL
  Filled 2017-11-13 (×2): qty 1

## 2017-11-13 MED ORDER — ACETAMINOPHEN 650 MG RE SUPP: 650.0000 mg | Freq: Four times a day (QID) | RECTAL | Status: DC | PRN

## 2017-11-13 MED ORDER — SODIUM CHLORIDE 0.9 % IV BOLUS (SEPSIS)
1000.0000 mL | Freq: Once | INTRAVENOUS | Status: AC
  Administered 2017-11-13: 1000 mL via INTRAVENOUS

## 2017-11-13 MED ORDER — MORPHINE SULFATE (PF) 4 MG/ML IV SOLN
2.0000 mg | INTRAVENOUS | Status: DC | PRN
  Administered 2017-11-13 – 2017-11-14 (×7): 4 mg via INTRAVENOUS
  Filled 2017-11-13 (×7): qty 1

## 2017-11-13 MED ORDER — ONDANSETRON HCL 4 MG PO TABS: 4.0000 mg | ORAL_TABLET | Freq: Four times a day (QID) | ORAL | Status: DC | PRN

## 2017-11-13 MED ORDER — DEXTROSE-NACL 5-0.45 % IV SOLN
INTRAVENOUS | Status: DC
  Administered 2017-11-13 – 2017-11-14 (×3): via INTRAVENOUS

## 2017-11-13 MED ORDER — BOOST / RESOURCE BREEZE PO LIQD CUSTOM
1.0000 | Freq: Three times a day (TID) | ORAL | Status: DC
  Administered 2017-11-13 – 2017-11-14 (×5): 1 via ORAL

## 2017-11-13 MED ORDER — PANTOPRAZOLE SODIUM 40 MG IV SOLR
40.0000 mg | Freq: Once | INTRAVENOUS | Status: AC
  Administered 2017-11-13: 40 mg via INTRAVENOUS
  Filled 2017-11-13: qty 40

## 2017-11-13 MED ORDER — ONDANSETRON HCL 4 MG/2ML IJ SOLN
4.0000 mg | Freq: Once | INTRAMUSCULAR | Status: AC
  Administered 2017-11-13: 4 mg via INTRAVENOUS
  Filled 2017-11-13: qty 2

## 2017-11-13 MED ORDER — LORAZEPAM 1 MG PO TABS: 1.0000 mg | ORAL_TABLET | Freq: Four times a day (QID) | ORAL | Status: DC | PRN

## 2017-11-13 MED ORDER — INFLUENZA VAC SPLIT QUAD 0.5 ML IM SUSY
0.5000 mL | PREFILLED_SYRINGE | INTRAMUSCULAR | Status: AC
  Administered 2017-11-14: 0.5 mL via INTRAMUSCULAR
  Filled 2017-11-13: qty 0.5

## 2017-11-13 MED ORDER — ONDANSETRON HCL 4 MG/2ML IJ SOLN: 4.0000 mg | Freq: Four times a day (QID) | INTRAMUSCULAR | Status: DC | PRN

## 2017-11-13 MED ORDER — PNEUMOCOCCAL VAC POLYVALENT 25 MCG/0.5ML IJ INJ
0.5000 mL | INJECTION | INTRAMUSCULAR | Status: AC
  Administered 2017-11-14: 0.5 mL via INTRAMUSCULAR
  Filled 2017-11-13: qty 0.5

## 2017-11-13 MED ORDER — VITAMIN B-1 100 MG PO TABS
100.0000 mg | ORAL_TABLET | Freq: Every day | ORAL | Status: DC
  Administered 2017-11-13 – 2017-11-14 (×2): 100 mg via ORAL
  Filled 2017-11-13 (×2): qty 1

## 2017-11-13 MED ORDER — THIAMINE HCL 100 MG/ML IJ SOLN: 100.0000 mg | Freq: Every day | INTRAMUSCULAR | Status: DC

## 2017-11-13 MED ORDER — LORAZEPAM 2 MG/ML IJ SOLN
1.0000 mg | Freq: Four times a day (QID) | INTRAMUSCULAR | Status: DC | PRN
  Administered 2017-11-13: 1 mg via INTRAVENOUS
  Filled 2017-11-13: qty 1

## 2017-11-13 MED ORDER — FOLIC ACID 1 MG PO TABS
1.0000 mg | ORAL_TABLET | Freq: Every day | ORAL | Status: DC
  Administered 2017-11-13 – 2017-11-14 (×2): 1 mg via ORAL
  Filled 2017-11-13 (×2): qty 1

## 2017-11-13 NOTE — ED Notes (Signed)
Dr. Wyvonnia Dusky, collected occult blood and tested in the room; MD noted that it was positive.

## 2017-11-13 NOTE — ED Provider Notes (Signed)
[facility] DEPT Provider Note   CSN: [id number] Arrival date & time: 11/12/17  1956     History   Chief Complaint Chief Complaint  Patient presents with  . Emesis    HPI EVAAN TIDWELL is a 52 y.o. male.  Patient is a poor historian.  He comes by EMS with a 1 day history of abdominal pain with nausea and vomiting.  States he has seen some red in his emesis but is not sure if it was blood.  Has not been able to keep anything down today.  Denies any blood in his stool.  Denies any fever.  Patient was found to be hyperglycemic in triage and is not a diabetic.  He admits to poor intake over the past several days.  He does have a history of alcohol abuse but states he has not had anything for 2 or 3 days.  When asked about his alcohol intake he says he might have one "one in a while".  Chart review shows history of extensive alcohol abuse with previous admissions for sepsis from aspiration pneumonia.  Patient does have history of peptic ulcer disease as well as colon cancer status post resection.   The history is provided by the patient and the EMS personnel.  Emesis   Associated symptoms include abdominal pain. Pertinent negatives include no arthralgias, no diarrhea, no headaches and no myalgias.    Past Medical History:  Diagnosis Date  . Acid reflux   . Colon cancer ([place])   . Colon cancer ([place])   . PUD (peptic ulcer disease)     Patient Active Problem List   Diagnosis Date Noted  . Hepatic steatosis 08/21/2016  . Thrombocytopenia ([place]) 08/21/2016  . Alcohol abuse 08/21/2016  . Alcohol withdrawal ([place]) 08/20/2016  . Dehydration 08/20/2016  . Intractable nausea and vomiting 08/20/2016  . Lactic acidosis 08/20/2016  . Aspiration pneumonia ([place]) 08/20/2016  . Sepsis ([place]) 08/20/2016    Past Surgical History:  Procedure Laterality Date  . COLON RESECTION         Home Medications    Prior to Admission medications   Medication Sig Start  Date End Date Taking? Authorizing Provider  esomeprazole (NEXIUM) 40 MG capsule Take 40 mg by mouth daily at 12 noon.    [provider]  folic acid (FOLVITE) 1 MG tablet Take 1 tablet (1 mg total) by mouth daily. 08/27/16   [name], MD  labetalol (NORMODYNE) 100 MG tablet Take 1 tablet (100 mg total) by mouth 2 (two) times daily. 08/27/16   [name], MD  ondansetron (ZOFRAN ODT) 8 MG disintegrating tablet Take 1 tablet (8 mg total) by mouth every 8 (eight) hours as needed for nausea or vomiting. 03/25/17   Jola Schmidt, MD    Family History Family History  Problem Relation Age of Onset  . Colon cancer Father   . Cancer Sister   . CAD Neg Hx   . Stroke Neg Hx   . Diabetes Neg Hx     Social History Social History   Tobacco Use  . Smoking status: Current Every Day Smoker    Packs/day: 1.00  . Smokeless tobacco: Never Used  Substance Use Topics  . Alcohol use: Yes    Comment: heavy alcohol abuse a beer and couple of shots a day for past  14 years  . Drug use: No     Allergies   Aspirin and Penicillins   Review of Systems Review  of Systems  Constitutional: Positive for activity change, appetite change and fatigue.  HENT: Negative for congestion and rhinorrhea.   Respiratory: Negative for chest tightness and shortness of breath.   Cardiovascular: Negative for chest pain.  Gastrointestinal: Positive for abdominal pain, nausea and vomiting. Negative for blood in stool and diarrhea.  Genitourinary: Negative for dysuria, hematuria and testicular pain.  Musculoskeletal: Negative for arthralgias and myalgias.  Skin: Negative for wound.  Neurological: Positive for weakness. Negative for dizziness and headaches.   all other systems are negative except as noted in the HPI and PMH.     Physical Exam Updated Vital Signs BP 137/74 (BP Location: Left Arm)   Pulse 77   Temp 98.1 F (36.7 C) (Oral)   Resp 18   Ht 6' (1.829 m)   Wt 61.2 kg (135 lb)   SpO2  100%   BMI 18.31 kg/m   Physical Exam  Constitutional: He is oriented to person, place, and time. He appears well-developed and well-nourished. No distress.  Disheveled, cachectic appearing  HENT:  Head: Normocephalic and atraumatic.  Mouth/Throat: Oropharynx is clear and moist. No oropharyngeal exudate.  Eyes: Conjunctivae and EOM are normal. Pupils are equal, round, and reactive to light.  Neck: Normal range of motion. Neck supple.  No meningismus.  Cardiovascular: Normal rate, regular rhythm, normal heart sounds and intact distal pulses.  No murmur heard. Pulmonary/Chest: Effort normal and breath sounds normal. No respiratory distress.  Abdominal: Soft. There is tenderness. There is no rebound and no guarding.  Epigastric tenderness with palpable aortic pulse No guarding or rebound  Musculoskeletal: Normal range of motion. He exhibits no edema or tenderness.  No gross blood on rectal exam stool brown  Neurological: He is alert and oriented to person, place, and time. No cranial nerve deficit. He exhibits normal muscle tone. Coordination normal.  No ataxia on finger to nose bilaterally. No pronator drift. 5/5 strength throughout. CN 2-12 intact.Equal grip strength. Sensation intact.   Skin: Skin is warm. Capillary refill takes less than 2 seconds. No rash noted.  Psychiatric: He has a normal mood and affect. His behavior is normal.  Nursing note and vitals reviewed.    ED Treatments / Results  Labs (all labs ordered are listed, but only abnormal results are displayed) Labs Reviewed  COMPREHENSIVE METABOLIC PANEL - Abnormal; Notable for the following components:      Result Value   Chloride 99 (*)    CO2 11 (*)    Glucose, Bld 52 (*)    Total Protein 8.2 (*)    AST 124 (*)    Total Bilirubin 1.4 (*)    Anion gap >20 (*)    All other components within normal limits  CBC - Abnormal; Notable for the following components:   WBC 13.4 (*)    RBC 4.09 (*)    MCV 105.9 (*)     MCH 35.9 (*)    All other components within normal limits  URINALYSIS, ROUTINE W REFLEX MICROSCOPIC - Abnormal; Notable for the following components:   Hgb urine dipstick SMALL (*)    Ketones, ur 80 (*)    Protein, ur 100 (*)    Bacteria, UA RARE (*)    All other components within normal limits  ETHANOL - Abnormal; Notable for the following components:   Alcohol, Ethyl (B) 32 (*)    All other components within normal limits  RAPID URINE DRUG SCREEN, HOSP PERFORMED - Abnormal; Notable for the following components:   Tetrahydrocannabinol  POSITIVE (*)    All other components within normal limits  BLOOD GAS, VENOUS - Abnormal; Notable for the following components:   pCO2, Ven 30.6 (*)    pO2, Ven 71.4 (*)    Bicarbonate 12.9 (*)    Acid-base deficit 12.9 (*)    All other components within normal limits  BASIC METABOLIC PANEL - Abnormal; Notable for the following components:   CO2 14 (*)    Calcium 8.2 (*)    Anion gap 20 (*)    All other components within normal limits  BETA-HYDROXYBUTYRIC ACID - Abnormal; Notable for the following components:   Beta-Hydroxybutyric Acid 6.88 (*)    All other components within normal limits  MAGNESIUM - Abnormal; Notable for the following components:   Magnesium 1.6 (*)    All other components within normal limits  CBG MONITORING, ED - Abnormal; Notable for the following components:   Glucose-Capillary 50 (*)    All other components within normal limits  CBG MONITORING, ED - Abnormal; Notable for the following components:   Glucose-Capillary 168 (*)    All other components within normal limits  LIPASE, BLOOD  PHOSPHORUS  BASIC METABOLIC PANEL  BASIC METABOLIC PANEL  HIV ANTIBODY (ROUTINE TESTING)  I-STAT CG4 LACTIC ACID, ED  POC OCCULT BLOOD, ED  I-STAT CG4 LACTIC ACID, ED    EKG  EKG Interpretation None       Radiology Dg Abdomen Acute W/chest  Result Date: 11/13/2017 CLINICAL DATA:  52 y/o  M; vomiting and right-sided abdominal  pain. EXAM: DG ABDOMEN ACUTE W/ 1V CHEST COMPARISON:  03/25/2017 CT abdomen and pelvis FINDINGS: There is no evidence of dilated bowel loops or free intraperitoneal air. Surgical clips project over left hemiabdomen. No radiopaque calculi or other significant radiographic abnormality is seen. Heart size and mediastinal contours are within normal limits. Both lungs are clear. IMPRESSION: Negative abdominal radiographs.  No acute cardiopulmonary disease. Electronically Signed   By: Kristine Garbe M.D.   On: 11/13/2017 01:07    Procedures Procedures (including critical care time)  Medications Ordered in ED Medications  sodium chloride 0.9 % bolus 1,000 mL (not administered)  ondansetron (ZOFRAN) injection 4 mg (not administered)  pantoprazole (PROTONIX) injection 40 mg (not administered)  ondansetron (ZOFRAN) injection 4 mg (4 mg Intravenous Given 11/13/17 0006)  dextrose 50 % solution 50 mL (50 mLs Intravenous Given 11/13/17 0007)     Initial Impression / Assessment and Plan / ED Course  I have reviewed the triage vital signs and the nursing notes.  Pertinent labs & imaging results that were available during my care of the patient were reviewed by me and considered in my medical decision making (see chart for details).     Patient with alcohol abuse presenting with upper abdominal pain nausea and vomiting of unclear hematemesis.  He is hematologically stable.  Labs show hyperglycemia with anion gap acidosis  Patient will be hydrated, given symptomatic control  Abdominal pulses palpable patient had a CT scan last year which showed no AAA.  Labs show metabolic acidosis with bicarb of 9.  Likely starvation ketosis.  No evidence of DKA.  Patient continues to have nausea and vomiting.  His abdomen is nonsurgical. Will require admission for hydration, correction of electrolytes, monitoring of his hemoglobin given his Hemoccult positive stools.  D/w Dr. Alcario Drought. CRITICAL  CARE Performed by: Ezequiel Essex Total critical care time: 33 minutes Critical care time was exclusive of separately billable procedures and treating other patients. Critical care was  necessary to treat or prevent imminent or life-threatening deterioration. Critical care was time spent personally by me on the following activities: development of treatment plan with patient and/or surrogate as well as nursing, discussions with consultants, evaluation of patient's response to treatment, examination of patient, obtaining history from patient or surrogate, ordering and performing treatments and interventions, ordering and review of laboratory studies, ordering and review of radiographic studies, pulse oximetry and re-evaluation of patient's condition.   Final Clinical Impressions(s) / ED Diagnoses   Final diagnoses:  Alcoholic ketosis  Dehydration    ED Discharge Orders    None       Aziah Brostrom, Annie Main, MD 11/13/17 [id number]

## 2017-11-13 NOTE — Care Management Note (Signed)
Case Management Note  Patient Details  Name: [name] MRN: [id number] Date of Birth: 1966/04/29  Subjective/Objective:                  Emesis and abd pain  Action/Plan: Date:  November 13, 2017 Chart reviewed for concurrent status and case management needs.  Will continue to follow patient progress.  Discharge Planning: following for needs.  None present at this time of review. Expected discharge date: November 16, 2017 Velva Harman, BSN, [place], [place]   Expected Discharge Date:                  Expected Discharge Plan:  Home/Self Care  In-House Referral:     Discharge planning Services  CM Consult  Post Acute Care Choice:    Choice offered to:     DME Arranged:    DME Agency:     HH Arranged:    HH Agency:     Status of Service:  In process, will continue to follow  If discussed at Long Length of Stay Meetings, dates discussed:    Additional Comments:  Leeroy Cha, RN 11/13/2017, 9:59 AM

## 2017-11-13 NOTE — Progress Notes (Signed)
Initial Nutrition Assessment  DOCUMENTATION CODES:   Underweight  INTERVENTION:   Provide Boost Breeze po TID, each supplement provides 250 kcal and 9 grams of protein  NUTRITION DIAGNOSIS:   Increased nutrient needs related to (ETOH abuse) as evidenced by estimated needs.  GOAL:   Patient will meet greater than or equal to 90% of their needs  MONITOR:   PO intake, Supplement acceptance, Labs, Weight trends, I & O's  REASON FOR ASSESSMENT:   Malnutrition Screening Tool    ASSESSMENT:   52 y.o. male with medical history significant of colon cancer in remission, EtOH abuse ongoing.  Patient reports drinking more ETOH than eating food. Pt on CIWA protocol. Pt drinks beer and shots of liquor daily. Would recommend Boost Breeze supplements given continued nausea. Diet was just advanced to regular diet, will monitor PO intakes.  Per chart review, weight has remained ~130 lb in the past year.  Medications: Folic acid tablet daily, Multivitamin with minerals daily, Thiamine tablet daily, D5 -.45% NaCl infusion at 125 ml/hr -provides 510 kcal Labs reviewed: Low Mg Phos WNL   NUTRITION - FOCUSED PHYSICAL EXAM:  Unable to perform NFPE  Diet Order:  DIET SOFT Room service appropriate? Yes; Fluid consistency: Thin  EDUCATION NEEDS:   No education needs have been identified at this time  Skin:  Skin Assessment: Reviewed RN Assessment  Last BM:  2/10  Height:   Ht Readings from Last 1 Encounters:  11/12/17 6' (1.829 m)    Weight:   Wt Readings from Last 1 Encounters:  11/12/17 135 lb (61.2 kg)    Ideal Body Weight:  80.9 kg  BMI:  Body mass index is 18.31 kg/m.  Estimated Nutritional Needs:   Kcal:  1850-2050  Protein:  90-100g  Fluid:  2L/day  Richard Bibles, MS, RD, LDN [place] [facility] Pager: [phone] After Hours Pager: [phone]

## 2017-11-13 NOTE — H&P (Signed)
History and Physical    Richard [name] FYB:[id number] DOB: 1965-11-23 DOA: 11/13/2017  PCP: Patient, No Pcp Per  Patient coming from: Home  I have personally briefly reviewed patient's old medical records in [place]  Chief Complaint: N/V  HPI: Richard [name] is a 52 y.o. male with medical history significant of colon cancer in remission, EtOH abuse ongoing.  Patient presents to ED with c/o 1 day history of Abd pain, inability to keep EtOH down due to N/V.  No fever, denies blood in stools or vomit.  To me the patient admits that he drinks heavily (which his chart review supports), and essentially doesn't have much PO intake other than EtOH.   ED Course: BGL low at 52.  Bicarb 11, AG > 20.  80 keytones in urine.  Given 1 amp D50.  VBG pH 7.25 with bicarb 12.9.   Review of Systems: As per HPI otherwise 10 point review of systems negative.   Past Medical History:  Diagnosis Date  . Acid reflux   . Colon cancer ([place])   . Colon cancer ([place])   . PUD (peptic ulcer disease)     Past Surgical History:  Procedure Laterality Date  . COLON RESECTION       reports that he has been smoking.  He has been smoking about 1.00 pack per day. he has never used smokeless tobacco. He reports that he drinks alcohol. He reports that he does not use drugs.  Allergies  Allergen Reactions  . Aspirin Other (See Comments)    Acid reflux   . Penicillins Hives    Has patient had a PCN reaction causing immediate rash, facial/tongue/throat swelling, SOB or lightheadedness with hypotension: yes Has patient had a PCN reaction causing severe rash involving mucus membranes or skin necrosis: no Has patient had a PCN reaction that required hospitalization: no Has patient had a PCN reaction occurring within the last 10 years: no If all of the above answers are "NO", then may proceed with Cephalosporin use.     Family History  Problem Relation Age of Onset  . Colon cancer Father   . Cancer Sister     . CAD Neg Hx   . Stroke Neg Hx   . Diabetes Neg Hx      Prior to Admission medications   Medication Sig Start Date End Date Taking? Authorizing Provider  esomeprazole (NEXIUM) 40 MG capsule Take 40 mg by mouth daily at 12 noon.    [provider]  folic acid (FOLVITE) 1 MG tablet Take 1 tablet (1 mg total) by mouth daily. 08/27/16   [name], MD  labetalol (NORMODYNE) 100 MG tablet Take 1 tablet (100 mg total) by mouth 2 (two) times daily. 08/27/16   [name], MD  ondansetron (ZOFRAN ODT) 8 MG disintegrating tablet Take 1 tablet (8 mg total) by mouth every 8 (eight) hours as needed for nausea or vomiting. 03/25/17   Jola Schmidt, MD    Physical Exam: Vitals:   11/12/17 2005 11/12/17 2348 11/13/17 0153  BP: 123/65 137/74 (!) 158/81  Pulse: 88 77 77  Resp: 14 18 16   Temp: 98.1 F (36.7 C)    TempSrc: Oral    SpO2: 98% 100% 100%  Weight: 61.2 kg (135 lb)    Height: 6' (1.829 m)      Constitutional: NAD, calm, comfortable Eyes: PERRL, lids and conjunctivae normal ENMT: Mucous membranes are moist. Posterior pharynx clear of any exudate or lesions.Normal  dentition.  Neck: normal, supple, no masses, no thyromegaly Respiratory: clear to auscultation bilaterally, no wheezing, no crackles. Normal respiratory effort. No accessory muscle use.  Cardiovascular: Regular rate and rhythm, no murmurs / rubs / gallops. No extremity edema. 2+ pedal pulses. No carotid bruits.  Abdomen: no tenderness, no masses palpated. No hepatosplenomegaly. Bowel sounds positive.  Musculoskeletal: no clubbing / cyanosis. No joint deformity upper and lower extremities. Good ROM, no contractures. Normal muscle tone.  Skin: no rashes, lesions, ulcers. No induration Neurologic: CN 2-12 grossly intact. Sensation intact, DTR normal. Strength 5/5 in all 4.  Psychiatric: Normal judgment and insight. Alert and oriented x 3. Normal mood.    Labs on Admission: I have personally reviewed following  labs and imaging studies  CBC: Recent Labs  Lab 11/12/17 2012  WBC 13.4*  HGB 14.7  HCT 43.3  MCV 105.9*  PLT 540   Basic Metabolic Panel: Recent Labs  Lab 11/12/17 2012  NA 136  K 3.6  CL 99*  CO2 11*  GLUCOSE 52*  BUN 8  CREATININE 0.78  CALCIUM 8.9   GFR: Estimated Creatinine Clearance: 96.7 mL/min (by C-G formula based on SCr of 0.78 mg/dL). Liver Function Tests: Recent Labs  Lab 11/12/17 2012  AST 124*  ALT 41  ALKPHOS 101  BILITOT 1.4*  PROT 8.2*  ALBUMIN 4.2   Recent Labs  Lab 11/12/17 2012  LIPASE 49   No results for input(s): AMMONIA in the last 168 hours. Coagulation Profile: No results for input(s): INR, PROTIME in the last 168 hours. Cardiac Enzymes: No results for input(s): CKTOTAL, CKMB, CKMBINDEX, TROPONINI in the last 168 hours. BNP (last 3 results) No results for input(s): PROBNP in the last 8760 hours. HbA1C: No results for input(s): HGBA1C in the last 72 hours. CBG: Recent Labs  Lab 11/12/17 2346 11/13/17 0033  GLUCAP 50* 168*   Lipid Profile: No results for input(s): CHOL, HDL, LDLCALC, TRIG, CHOLHDL, LDLDIRECT in the last 72 hours. Thyroid Function Tests: No results for input(s): TSH, T4TOTAL, FREET4, T3FREE, THYROIDAB in the last 72 hours. Anemia Panel: No results for input(s): VITAMINB12, FOLATE, FERRITIN, TIBC, IRON, RETICCTPCT in the last 72 hours. Urine analysis:    Component Value Date/Time   COLORURINE YELLOW 11/12/2017 2007   APPEARANCEUR CLEAR 11/12/2017 2007   LABSPEC 1.016 11/12/2017 2007   PHURINE 5.0 11/12/2017 2007   GLUCOSEU NEGATIVE 11/12/2017 2007   HGBUR SMALL (A) 11/12/2017 2007   BILIRUBINUR NEGATIVE 11/12/2017 2007   KETONESUR 80 (A) 11/12/2017 2007   PROTEINUR 100 (A) 11/12/2017 2007   UROBILINOGEN 0.2 01/13/2009 1011   NITRITE NEGATIVE 11/12/2017 2007   LEUKOCYTESUR NEGATIVE 11/12/2017 2007    Radiological Exams on Admission: Dg Abdomen Acute W/chest  Result Date: 11/13/2017 CLINICAL DATA:   52 y/o  M; vomiting and right-sided abdominal pain. EXAM: DG ABDOMEN ACUTE W/ 1V CHEST COMPARISON:  03/25/2017 CT abdomen and pelvis FINDINGS: There is no evidence of dilated bowel loops or free intraperitoneal air. Surgical clips project over left hemiabdomen. No radiopaque calculi or other significant radiographic abnormality is seen. Heart size and mediastinal contours are within normal limits. Both lungs are clear. IMPRESSION: Negative abdominal radiographs.  No acute cardiopulmonary disease. Electronically Signed   By: Kristine Garbe M.D.   On: 11/13/2017 01:07    EKG: Independently reviewed.  Assessment/Plan Principal Problem:   Alcoholic ketoacidosis Active Problems:   Intractable nausea and vomiting   Alcohol abuse   Occult blood positive stool    1. Alcoholic  ketoacidosis - 1. D5half at 125 cc/hr 2. Q4H BMPs 3. Check and replace Mg, PO4 4. Zofran PRN 5. Morphine PRN 2. EtOH abuse - 1. CIWA 3. Occult positive stool - with h/o colon cancer 10+ years ago. 1. Needs GI follow up at a minimum 2. Repeat CBC in AM, but no gross blood or large volume bleed  DVT prophylaxis: SCDs Code Status: Full Family Communication: No family in room Disposition Plan: Home after admit Consults called: None Admission status: Admit to inpatient   [place], [place] [facility] Pager [phone]  If 7AM-7PM, please contact day team taking care of patient [email or web address] Password [email or web address]  11/13/2017, 2:28 AM

## 2017-11-13 NOTE — ED Notes (Signed)
ED TO INPATIENT HANDOFF REPORT  Name/Age/Gender Richard [name] 52 y.o. male  Code Status    Code Status Orders  (From admission, onward)        Start     Ordered   11/13/17 0227  Full code  Continuous     11/13/17 0228    Code Status History    Date Active Date Inactive Code Status Order ID Comments User Context   08/21/2016 02:00 08/27/2016 16:13 Full Code [id number]  Toy Baker, MD Inpatient   08/20/2016 23:23 08/21/2016 02:00 Full Code [id number]  [facility]      Home/SNF/Other Home  Chief Complaint Emesis  Level of Care/Admitting Diagnosis ED Disposition    ED Disposition Condition [facility] Area: [facility] [[id number]]  Level of Care: Med-Surg [16]  Diagnosis: Alcoholic ketoacidosis [030092]  Admitting Physician: Etta Quill [[id number]]  Attending Physician: Etta Quill [[id number]]  Estimated length of stay: past midnight tomorrow  Certification:: I certify this patient will need inpatient services for at least 2 midnights  PT Class (Do Not Modify): Inpatient [101]  PT Acc Code (Do Not Modify): Private [1]       Medical History Past Medical History:  Diagnosis Date  . Acid reflux   . Colon cancer ([place])   . Colon cancer ([place])   . PUD (peptic ulcer disease)     Allergies Allergies  Allergen Reactions  . Aspirin Other (See Comments)    Acid reflux   . Penicillins Hives    Has patient had a PCN reaction causing immediate rash, facial/tongue/throat swelling, SOB or lightheadedness with hypotension: yes Has patient had a PCN reaction causing severe rash involving mucus membranes or skin necrosis: no Has patient had a PCN reaction that required hospitalization: no Has patient had a PCN reaction occurring within the last 10 years: no If all of the above answers are "NO", then may proceed with Cephalosporin use.     IV Location/Drains/Wounds Patient Lines/Drains/Airways Status   Active  Line/Drains/Airways    Name:   Placement date:   Placement time:   Site:   Days:   External Urinary Catheter   08/23/16    1800    -   447          Labs/Imaging Results for orders placed or performed during the hospital encounter of 11/13/17 (from the past 48 hour(s))  Urinalysis, Routine w reflex microscopic     Status: Abnormal   Collection Time: 11/12/17  8:07 PM  Result Value Ref Range   Color, Urine YELLOW YELLOW   APPearance CLEAR CLEAR   Specific Gravity, Urine 1.016 1.005 - 1.030   pH 5.0 5.0 - 8.0   Glucose, UA NEGATIVE NEGATIVE mg/dL   Hgb urine dipstick SMALL (A) NEGATIVE   Bilirubin Urine NEGATIVE NEGATIVE   Ketones, ur 80 (A) NEGATIVE mg/dL   Protein, ur 100 (A) NEGATIVE mg/dL   Nitrite NEGATIVE NEGATIVE   Leukocytes, UA NEGATIVE NEGATIVE   RBC / HPF 0-5 0 - 5 RBC/hpf   WBC, UA 0-5 0 - 5 WBC/hpf   Bacteria, UA RARE (A) NONE SEEN   Squamous Epithelial / LPF NONE SEEN NONE SEEN   Mucus PRESENT    Hyaline Casts, UA PRESENT     Comment: Performed at [facility], [place] [address] [place], [place] [address]  Lipase, blood     Status: None   Collection Time: 11/12/17  8:12 PM  Result Value Ref Range   Lipase 49 11 - 51 U/L    Comment: Performed at [facility], [place] [address] [place], [place] [address]  Comprehensive metabolic panel     Status: Abnormal   Collection Time: 11/12/17  8:12 PM  Result Value Ref Range   Sodium 136 135 - 145 mmol/L   Potassium 3.6 3.5 - 5.1 mmol/L   Chloride 99 (L) 101 - 111 mmol/L   CO2 11 (L) 22 - 32 mmol/L   Glucose, Bld 52 (L) 65 - 99 mg/dL   BUN 8 6 - 20 mg/dL   Creatinine, Ser 0.78 0.61 - 1.24 mg/dL   Calcium 8.9 8.9 - 10.3 mg/dL   Total Protein 8.2 (H) 6.5 - 8.1 g/dL   Albumin 4.2 3.5 - 5.0 g/dL   AST 124 (H) 15 - 41 U/L   ALT 41 17 - 63 U/L   Alkaline Phosphatase 101 38 - 126 U/L   Total Bilirubin 1.4 (H) 0.3 - 1.2 mg/dL   GFR calc non Af Amer >60 >60 mL/min   GFR calc Af Amer >60  >60 mL/min    Comment: (NOTE) The eGFR has been calculated using the CKD EPI equation. This calculation has not been validated in all clinical situations. eGFR's persistently <60 mL/min signify possible Chronic Kidney Disease.    Anion gap >20 (H) 5 - 15    Comment: Performed at [facility], [place] [address], [place], [place] [address]  CBC     Status: Abnormal   Collection Time: 11/12/17  8:12 PM  Result Value Ref Range   WBC 13.4 (H) 4.0 - 10.5 K/uL   RBC 4.09 (L) 4.22 - 5.81 MIL/uL   Hemoglobin 14.7 13.0 - 17.0 g/dL   HCT 43.3 39.0 - 52.0 %   MCV 105.9 (H) 78.0 - 100.0 fL   MCH 35.9 (H) 26.0 - 34.0 pg   MCHC 33.9 30.0 - 36.0 g/dL   RDW 13.5 11.5 - 15.5 %   Platelets 238 150 - 400 K/uL    Comment: Performed at [facility], [place] [address] [place], [place] [address]  CBG monitoring, ED     Status: Abnormal   Collection Time: 11/12/17 11:46 PM  Result Value Ref Range   Glucose-Capillary 50 (L) 65 - 99 mg/dL   Comment 1 Notify RN   CBG monitoring, ED     Status: Abnormal   Collection Time: 11/13/17 12:33 AM  Result Value Ref Range   Glucose-Capillary 168 (H) 65 - 99 mg/dL  Ethanol     Status: Abnormal   Collection Time: 11/13/17 12:38 AM  Result Value Ref Range   Alcohol, Ethyl (B) 32 (H) <10 mg/dL    Comment:        LOWEST DETECTABLE LIMIT FOR SERUM ALCOHOL IS 10 mg/dL FOR MEDICAL PURPOSES ONLY Performed at [place] [address], [place], [place] [address]   I-Stat CG4 Lactic Acid, ED     Status: None   Collection Time: 11/13/17 12:53 AM  Result Value Ref Range   Lactic Acid, Venous 0.70 0.5 - 1.9 mmol/L  Blood gas, venous     Status: Abnormal (Preliminary result)   Collection Time: 11/13/17  1:30 AM  Result Value Ref Range   FIO2 PENDING    O2 Content PENDING L/min   pH, Ven 7.250 7.250 - 7.430   pCO2, Ven 30.6 (L) 44.0 - 60.0 mmHg   pO2, Ven 71.4 (H)  32.0 - 45.0 mmHg   Bicarbonate 12.9 (L) 20.0 -  28.0 mmol/L   Acid-base deficit 12.9 (H) 0.0 - 2.0 mmol/L   O2 Saturation 90.1 %   Patient temperature 98.6    Collection site VEIN    Drawn by [phone]    Sample type VENIPUNCTURE     Comment: Performed at [facility] [place] [address], [place], [place] [address]   Dg Abdomen Acute W/chest  Result Date: 11/13/2017 CLINICAL DATA:  52 y/o  M; vomiting and right-sided abdominal pain. EXAM: DG ABDOMEN ACUTE W/ 1V CHEST COMPARISON:  03/25/2017 CT abdomen and pelvis FINDINGS: There is no evidence of dilated bowel loops or free intraperitoneal air. Surgical clips project over left hemiabdomen. No radiopaque calculi or other significant radiographic abnormality is seen. Heart size and mediastinal contours are within normal limits. Both lungs are clear. IMPRESSION: Negative abdominal radiographs.  No acute cardiopulmonary disease. Electronically Signed   By: Kristine Garbe M.D.   On: 11/13/2017 01:07    Pending Labs Unresulted Labs (From admission, onward)   Start     Ordered   11/13/17 0229  Beta-hydroxybutyric acid  STAT,   R     11/13/17 0228   11/13/17 0229  Magnesium  STAT,   R     11/13/17 0228   11/13/17 0229  Phosphorus  STAT,   R     11/13/17 0228   11/13/17 0226  HIV antibody (Routine Testing)  Once,   R     11/13/17 0228   11/13/17 8413  Basic metabolic panel  Now then every 4 hours,   R     11/13/17 0154   11/13/17 0026  Rapid urine drug screen (hospital performed)  STAT,   R     11/13/17 0025      Vitals/Pain Today's Vitals   11/12/17 2005 11/12/17 2348 11/13/17 0153  BP: 123/65 137/74 (!) 158/81  Pulse: 88 77 77  Resp: '14 18 16  ' Temp: 98.1 F (36.7 C)    TempSrc: Oral    SpO2: 98% 100% 100%  Weight: 135 lb (61.2 kg)    Height: 6' (1.829 m)    PainSc: 10-Worst pain ever      Isolation Precautions No active isolations  Medications Medications  dextrose 5 %-0.45 % sodium chloride infusion (not administered)  LORazepam (ATIVAN) tablet 1  mg (not administered)    Or  LORazepam (ATIVAN) injection 1 mg (not administered)  thiamine (VITAMIN B-1) tablet 100 mg (not administered)    Or  thiamine (B-1) injection 100 mg (not administered)  folic acid (FOLVITE) tablet 1 mg (not administered)  multivitamin with minerals tablet 1 tablet (not administered)  acetaminophen (TYLENOL) tablet 650 mg (not administered)    Or  acetaminophen (TYLENOL) suppository 650 mg (not administered)  ondansetron (ZOFRAN) tablet 4 mg (not administered)    Or  ondansetron (ZOFRAN) injection 4 mg (not administered)  morphine 2 MG/ML injection 2-4 mg (not administered)  ondansetron (ZOFRAN) injection 4 mg (4 mg Intravenous Given 11/13/17 0006)  dextrose 50 % solution 50 mL (50 mLs Intravenous Given 11/13/17 0007)  sodium chloride 0.9 % bolus 1,000 mL (0 mLs Intravenous Stopped 11/13/17 0239)  ondansetron (ZOFRAN) injection 4 mg (4 mg Intravenous Given 11/13/17 0139)  pantoprazole (PROTONIX) injection 40 mg (40 mg Intravenous Given 11/13/17 0139)    Mobility walks

## 2017-11-13 NOTE — Progress Notes (Signed)
[facility] Progress Note  Subjective: want s to eat, still some R sided abd pain  Vitals:   11/13/17 0153 11/13/17 0300 11/13/17 0350 11/13/17 1146  BP: (!) 158/81 (!) 142/74 (!) 156/91 (!) 151/75  Pulse: 77 73 71 68  Resp: 16 16 16 16   Temp:   98.3 F (36.8 C) 97.9 F (36.6 C)  TempSrc:   Oral Oral  SpO2: 100% 96% 97% 98%  Weight:      Height:        Inpatient medications: . folic acid  1 mg Oral Daily  . [START ON 11/14/2017] Influenza vac split quadrivalent PF  0.5 mL Intramuscular Tomorrow-1000  . multivitamin with minerals  1 tablet Oral Daily  . [START ON 11/14/2017] pneumococcal 23 valent vaccine  0.5 mL Intramuscular Tomorrow-1000  . thiamine  100 mg Oral Daily   Or  . thiamine  100 mg Intravenous Daily   . dextrose 5 % and 0.45% NaCl 125 mL/hr at 11/13/17 0343   acetaminophen **OR** acetaminophen, LORazepam **OR** LORazepam, morphine injection, ondansetron **OR** ondansetron (ZOFRAN) IV  Exam: Thin AAM, no distress No jvd Chest clear bilat RRR no mrg aBd soft ntnd no ascites Ext no edema NF, slight tremors of the hands, full strength x 4 ext, Ox 3   Brief Summary: Richard [name] is a 52 y.o. male with medical history significant of colon cancer in remission, EtOH abuse ongoing.  Patient presented to ED 11/12/17 with c/o 1 day history of abd pain, inability to keep EtOH down due to N/V.  No fever, denies blood in stools or vomit. Patient said he drinks heavily and essentially doesn't have much PO intake other than EtOH.  In ED blood glucose was low at 52, bicarb 11, AG > 20.  80 ketones in urine.  Given 1 amp D50.  ABG pH 7.25 with bicarb 12.9.  CXR and abd xray negative.  Asked to see for admission.     Principal Problem:   Alcoholic ketoacidosis Active Problems:   Intractable nausea and vomiting   Alcohol abuse   Occult blood positive stool      Impression/Plan:  1)  Alcoholic ketoacidosis/ intractable nausea and vomiting - prob etoh gastritis,  improving , less nausea  - anion gap down from 20 > 15 today - cont IVF"s, advance diet, f/u labs in am - prn's for pain and nausea - IV PPI for now  2)  EtOH abuse - - shaky but no disoriented - cont CIWA - SW consult, interested in OP therapy  3)  Occult positive stool - with h/o colon cancer 10+ years ago. - Needs GI follow up at a minimum      - f/u Hb in am  Kelly Splinter MD [facility] pgr [phone] 11/13/2017, 12:50 PM    DVT prophylaxis: SCDs Code Status: Full Family Communication: No family in room Disposition Plan: Home after admit Consults called: None Admission status: Admit to inpatient     Procedures: -none  Consults: -none     Recent Labs  Lab 11/12/17 2012 11/13/17 0426 11/13/17 1028  NA 136 140 136  K 3.6 3.7 3.6  CL 99* 106 102  CO2 11* 14* 19*  GLUCOSE 52* 76 100*  BUN 8 7 7   CREATININE 0.78 0.72 0.78  CALCIUM 8.9 8.2* 8.3*  PHOS  --  2.7  --    Recent Labs  Lab 11/12/17 2012  AST 124*  ALT 41  ALKPHOS 101  BILITOT 1.4*  PROT 8.2*  ALBUMIN 4.2   Recent Labs  Lab 11/12/17 2012  WBC 13.4*  HGB 14.7  HCT 43.3  MCV 105.9*  PLT 238   Iron/TIBC/Ferritin/ %Sat    Component Value Date/Time   IRON 147 08/21/2016 1251   TIBC 337 08/21/2016 1251   FERRITIN 243 08/21/2016 1251   IRONPCTSAT 44 (H) 08/21/2016 1251

## 2017-11-14 ENCOUNTER — Inpatient Hospital Stay (HOSPITAL_COMMUNITY): Payer: Self-pay

## 2017-11-14 LAB — COMPREHENSIVE METABOLIC PANEL
ALK PHOS: 72 U/L (ref 38–126)
ALT: 26 U/L (ref 17–63)
ANION GAP: 13 (ref 5–15)
AST: 59 U/L — AB (ref 15–41)
Albumin: 3.3 g/dL — ABNORMAL LOW (ref 3.5–5.0)
BILIRUBIN TOTAL: 1.2 mg/dL (ref 0.3–1.2)
CALCIUM: 8.8 mg/dL — AB (ref 8.9–10.3)
CO2: 24 mmol/L (ref 22–32)
CREATININE: 0.5 mg/dL — AB (ref 0.61–1.24)
Chloride: 95 mmol/L — ABNORMAL LOW (ref 101–111)
GFR calc Af Amer: >60 mL/min (ref >60)
GFR calc non Af Amer: >60 mL/min (ref >60)
GLUCOSE: 126 mg/dL — AB (ref 65–99)
Potassium: 2.9 mmol/L — ABNORMAL LOW (ref 3.5–5.1)
Sodium: 132 mmol/L — ABNORMAL LOW (ref 135–145)
TOTAL PROTEIN: 6.4 g/dL — AB (ref 6.5–8.1)

## 2017-11-14 LAB — CBC
HEMATOCRIT: 31.4 % — AB (ref 39.0–52.0)
HEMOGLOBIN: 11.2 g/dL — AB (ref 13.0–17.0)
MCH: 35.3 pg — ABNORMAL HIGH (ref 26.0–34.0)
MCHC: 35.7 g/dL (ref 30.0–36.0)
MCV: 99.1 fL (ref 78.0–100.0)
Platelets: 162 10*3/uL (ref 150–400)
RBC: 3.17 MIL/uL — ABNORMAL LOW (ref 4.22–5.81)
RDW: 12.5 % (ref 11.5–15.5)
WBC: 4.7 10*3/uL (ref 4.0–10.5)

## 2017-11-14 MED ORDER — POTASSIUM CHLORIDE CRYS ER 20 MEQ PO TBCR
40.0000 meq | EXTENDED_RELEASE_TABLET | Freq: Three times a day (TID) | ORAL | Status: AC
  Administered 2017-11-14 (×2): 40 meq via ORAL
  Filled 2017-11-14 (×2): qty 2

## 2017-11-14 MED ORDER — IOPAMIDOL (ISOVUE-300) INJECTION 61%
INTRAVENOUS | Status: AC
  Filled 2017-11-14: qty 100

## 2017-11-14 MED ORDER — IOPAMIDOL (ISOVUE-300) INJECTION 61%
100.0000 mL | Freq: Once | INTRAVENOUS | Status: AC | PRN
  Administered 2017-11-14: 80 mL via INTRAVENOUS

## 2017-11-14 MED ORDER — ADULT MULTIVITAMIN W/MINERALS CH: 1.0000 | ORAL_TABLET | Freq: Every day | ORAL | Status: DC

## 2017-11-14 MED ORDER — SODIUM CHLORIDE 0.45 % IV SOLN
INTRAVENOUS | Status: AC
  Administered 2017-11-14 (×2): via INTRAVENOUS

## 2017-11-14 MED ORDER — FOLIC ACID 1 MG PO TABS: 1.0000 mg | ORAL_TABLET | Freq: Every day | ORAL | Status: DC

## 2017-11-14 MED ORDER — SODIUM CHLORIDE 0.9 % IV BOLUS (SEPSIS)
500.0000 mL | Freq: Once | INTRAVENOUS | Status: AC
  Administered 2017-11-14: 500 mL via INTRAVENOUS

## 2017-11-14 MED ORDER — IOPAMIDOL (ISOVUE-300) INJECTION 61%
INTRAVENOUS | Status: AC
  Administered 2017-11-14: 15 mL
  Filled 2017-11-14: qty 30

## 2017-11-14 MED ORDER — THIAMINE HCL 100 MG/ML IJ SOLN: 100.0000 mg | Freq: Every day | INTRAMUSCULAR | Status: DC

## 2017-11-14 MED ORDER — LORAZEPAM 1 MG PO TABS
1.0000 mg | ORAL_TABLET | Freq: Four times a day (QID) | ORAL | Status: DC | PRN
  Administered 2017-11-14: 1 mg via ORAL
  Filled 2017-11-14: qty 1

## 2017-11-14 MED ORDER — IOPAMIDOL (ISOVUE-300) INJECTION 61%: 15.0000 mL | Freq: Once | INTRAVENOUS | Status: DC | PRN

## 2017-11-14 MED ORDER — LORAZEPAM 2 MG/ML IJ SOLN
1.0000 mg | Freq: Four times a day (QID) | INTRAMUSCULAR | Status: DC | PRN
  Administered 2017-11-14: 1 mg via INTRAVENOUS
  Filled 2017-11-14: qty 1

## 2017-11-14 MED ORDER — POTASSIUM CHLORIDE 10 MEQ/100ML IV SOLN
10.0000 meq | INTRAVENOUS | Status: AC
  Administered 2017-11-14 (×3): 10 meq via INTRAVENOUS
  Filled 2017-11-14 (×3): qty 100

## 2017-11-14 MED ORDER — MAGNESIUM SULFATE 2 GM/50ML IV SOLN
2.0000 g | Freq: Once | INTRAVENOUS | Status: AC
  Administered 2017-11-14: 2 g via INTRAVENOUS
  Filled 2017-11-14: qty 50

## 2017-11-14 MED ORDER — VITAMIN B-1 100 MG PO TABS: 100.0000 mg | ORAL_TABLET | Freq: Every day | ORAL | Status: DC

## 2017-11-15 NOTE — Discharge Summary (Signed)
Physician Discharge Summary  PRUITT TABOADA TOI:[id number] DOB: [birth year] DOA: 11/13/2017  PCP: Patient, No Pcp Per  Admit date: 11/13/2017 Discharge date: 11/15/2017  Admitted From:  Disposition:  Recommendations for Outpatient Follow-up:  1. Follow up with PCP in 1-2 weeks 2. Please obtain BMP/CBC in one week 3. Please follow up on the following pending results:  Home Health Equipment/Devices:  Discharge Condition: CODE STATUS: Diet recommendation: Brief/Interim Summary:PATIENT LEFT AMA EVEN THOUGH I SAW HIM AND TOLD HIM I WILL DISCHRAGE YOU ASAP Discharge Diagnoses:  Principal Problem:   Alcoholic ketoacidosis Active Problems:   Intractable nausea and vomiting   Alcohol abuse   Occult blood positive stool    Discharge Instructions   Allergies as of 11/15/2017      Reactions   Aspirin Other (See Comments)   Acid reflux    Penicillins Hives   Has patient had a PCN reaction causing immediate rash, facial/tongue/throat swelling, SOB or lightheadedness with hypotension: yes Has patient had a PCN reaction causing severe rash involving mucus membranes or skin necrosis: no Has patient had a PCN reaction that required hospitalization: no Has patient had a PCN reaction occurring within the last 10 years: no If all of the above answers are "NO", then may proceed with Cephalosporin use.      Medication List    ASK your doctor about these medications   esomeprazole 40 MG capsule Commonly known as:  NEXIUM Take 40 mg by mouth daily at 12 noon.   folic acid 1 MG tablet Commonly known as:  FOLVITE Take 1 tablet (1 mg total) by mouth daily.   labetalol 100 MG tablet Commonly known as:  NORMODYNE Take 1 tablet (100 mg total) by mouth 2 (two) times daily.   ondansetron 8 MG disintegrating tablet Commonly known as:  ZOFRAN ODT Take 1 tablet (8 mg total) by mouth every 8 (eight) hours as needed for nausea or vomiting.       Allergies  Allergen Reactions  . Aspirin Other  (See Comments)    Acid reflux   . Penicillins Hives    Has patient had a PCN reaction causing immediate rash, facial/tongue/throat swelling, SOB or lightheadedness with hypotension: yes Has patient had a PCN reaction causing severe rash involving mucus membranes or skin necrosis: no Has patient had a PCN reaction that required hospitalization: no Has patient had a PCN reaction occurring within the last 10 years: no If all of the above answers are "NO", then may proceed with Cephalosporin use.     Consultations:     Procedures/Studies: Ct Abdomen W Contrast  Result Date: 11/14/2017 CLINICAL DATA:  Right upper quadrant pain and nausea and vomiting for several days. Alcohol misuse disorder. Personal history of colon carcinoma. EXAM: CT ABDOMEN WITH CONTRAST TECHNIQUE: Multidetector CT imaging of the abdomen was performed using the standard protocol following bolus administration of intravenous contrast. CONTRAST:  72mL ISOVUE-300 IOPAMIDOL (ISOVUE-300) INJECTION 61% COMPARISON:  03/25/2017 FINDINGS: Lower chest: No acute findings. Hepatobiliary: No hepatic masses identified. Moderate diffuse hepatic steatosis again demonstrated with focal sparing adjacent to the gallbladder fossa. Gallbladder is unremarkable. Pancreas:  No mass or inflammatory changes. Spleen:  Within normal limits in size and appearance. Adrenals/Urinary Tract: No masses identified. No evidence of hydronephrosis. Stomach/Bowel: Large amount of stool noted in visualized portion of colon in the left quadrant. Vascular/Lymphatic: No pathologically enlarged lymph nodes identified. No abdominal aortic aneurysm. Aortic atherosclerosis. Other:  None. Musculoskeletal:  No suspicious bone lesions identified. IMPRESSION: No acute findings.  Stable moderate hepatic steatosis. Aortic atherosclerosis. Electronically Signed   By: Earle Gell M.D.   On: 11/14/2017 17:11   Dg Abdomen Acute W/chest  Result Date: 11/13/2017 CLINICAL DATA:  52 y/o   M; vomiting and right-sided abdominal pain. EXAM: DG ABDOMEN ACUTE W/ 1V CHEST COMPARISON:  03/25/2017 CT abdomen and pelvis FINDINGS: There is no evidence of dilated bowel loops or free intraperitoneal air. Surgical clips project over left hemiabdomen. No radiopaque calculi or other significant radiographic abnormality is seen. Heart size and mediastinal contours are within normal limits. Both lungs are clear. IMPRESSION: Negative abdominal radiographs.  No acute cardiopulmonary disease. Electronically Signed   By: Kristine Garbe M.D.   On: 11/13/2017 01:07    (Echo, Carotid, EGD, Colonoscopy, ERCP)    Subjective:   Discharge Exam: Vitals:   11/14/17 2344 11/15/17 0528  BP: (!) 147/89 (!) 151/91  Pulse: 74 83  Resp: 18 16  Temp: 98.3 F (36.8 C) 98.1 F (36.7 C)  SpO2: 100% 100%   Vitals:   11/14/17 2113 11/14/17 2200 11/14/17 2344 11/15/17 0528  BP: (!) 159/92 (!) 159/92 (!) 147/89 (!) 151/91  Pulse: 90 90 74 83  Resp: 20  18 16   Temp: 98.2 F (36.8 C)  98.3 F (36.8 C) 98.1 F (36.7 C)  TempSrc: Oral  Oral Oral  SpO2: 100%  100% 100%  Weight:      Height:        General: Pt is alert, awake, not in acute distress Cardiovascular: RRR, S1/S2 +, no rubs, no gallops Respiratory: CTA bilaterally, no wheezing, no rhonchi Abdominal: Soft, NT, ND, bowel sounds + Extremities: no edema, no cyanosis    The results of significant diagnostics from this hospitalization (including imaging, microbiology, ancillary and laboratory) are listed below for reference.     Microbiology: No results found for this or any previous visit (from the past 240 hour(s)).   Labs: BNP (last 3 results) No results for input(s): BNP in the last 8760 hours. Basic Metabolic Panel: Recent Labs  Lab 11/12/17 2012 11/13/17 0426 11/13/17 1028 11/14/17 0618  NA 136 140 136 132*  K 3.6 3.7 3.6 2.9*  CL 99* 106 102 95*  CO2 11* 14* 19* 24  GLUCOSE 52* 76 100* 126*  BUN 8 7 7  <5*   CREATININE 0.78 0.72 0.78 0.50*  CALCIUM 8.9 8.2* 8.3* 8.8*  MG  --  1.6*  --   --   PHOS  --  2.7  --   --    Liver Function Tests: Recent Labs  Lab 11/12/17 2012 11/14/17 0618  AST 124* 59*  ALT 41 26  ALKPHOS 101 72  BILITOT 1.4* 1.2  PROT 8.2* 6.4*  ALBUMIN 4.2 3.3*   Recent Labs  Lab 11/12/17 2012  LIPASE 49   No results for input(s): AMMONIA in the last 168 hours. CBC: Recent Labs  Lab 11/12/17 2012 11/14/17 0618  WBC 13.4* 4.7  HGB 14.7 11.2*  HCT 43.3 31.4*  MCV 105.9* 99.1  PLT 238 162   Cardiac Enzymes: No results for input(s): CKTOTAL, CKMB, CKMBINDEX, TROPONINI in the last 168 hours. BNP: Invalid input(s): POCBNP CBG: Recent Labs  Lab 11/12/17 2346 11/13/17 0033  GLUCAP 50* 168*   D-Dimer No results for input(s): DDIMER in the last 72 hours. Hgb A1c No results for input(s): HGBA1C in the last 72 hours. Lipid Profile No results for input(s): CHOL, HDL, LDLCALC, TRIG, CHOLHDL, LDLDIRECT in the last 72 hours. Thyroid function studies  No results for input(s): TSH, T4TOTAL, T3FREE, THYROIDAB in the last 72 hours.  Invalid input(s): FREET3 Anemia work up No results for input(s): VITAMINB12, FOLATE, FERRITIN, TIBC, IRON, RETICCTPCT in the last 72 hours. Urinalysis    Component Value Date/Time   COLORURINE YELLOW 11/12/2017 2007   APPEARANCEUR CLEAR 11/12/2017 2007   LABSPEC 1.016 11/12/2017 2007   PHURINE 5.0 11/12/2017 2007   GLUCOSEU NEGATIVE 11/12/2017 2007   HGBUR SMALL (A) 11/12/2017 2007   BILIRUBINUR NEGATIVE 11/12/2017 2007   KETONESUR 80 (A) 11/12/2017 2007   PROTEINUR 100 (A) 11/12/2017 2007   UROBILINOGEN 0.2 01/13/2009 1011   NITRITE NEGATIVE 11/12/2017 2007   LEUKOCYTESUR NEGATIVE 11/12/2017 2007   Sepsis Labs Invalid input(s): PROCALCITONIN,  WBC,  LACTICIDVEN Microbiology No results found for this or any previous visit (from the past 240 hour(s)).   Time coordinating discharge: Over 30  minutes  SIGNED:   Georgette Shell, MD  [facility] 11/15/2017, 3:43 PM Pager   If 7PM-7AM, please contact night-coverage [email or web address] Password [email or web address]

## 2017-11-15 NOTE — Progress Notes (Signed)
Patient packed and ready to leave hospital.  Patient wanting to sign out AMA.  Dr. Rodena Piety came and spoke with patient.  Patient refusing to wait for discharge.  IV removed and [place] Advice form signed and placed in patient's chart.

## 2018-03-14 ENCOUNTER — Encounter (HOSPITAL_COMMUNITY): Payer: Self-pay

## 2018-03-14 ENCOUNTER — Emergency Department (HOSPITAL_COMMUNITY)
Admission: EM | Admit: 2018-03-14 | Discharge: 2018-03-14 | Disposition: A | Discharge status: Home / Self Care | Payer: Self-pay | Attending: Emergency Medicine | Admitting: Emergency Medicine

## 2018-03-14 DIAGNOSIS — Z79899 Other long term (current) drug therapy: Secondary | ICD-10-CM | POA: Insufficient documentation

## 2018-03-14 DIAGNOSIS — F17200 Nicotine dependence, unspecified, uncomplicated: Secondary | ICD-10-CM | POA: Insufficient documentation

## 2018-03-14 DIAGNOSIS — R05 Cough: Secondary | ICD-10-CM | POA: Insufficient documentation

## 2018-03-14 DIAGNOSIS — R040 Epistaxis: Secondary | ICD-10-CM | POA: Insufficient documentation

## 2018-03-14 DIAGNOSIS — Z85038 Personal history of other malignant neoplasm of large intestine: Secondary | ICD-10-CM | POA: Insufficient documentation

## 2018-03-14 LAB — COMPREHENSIVE METABOLIC PANEL
ALBUMIN: 3.5 g/dL (ref 3.5–5.0)
ALK PHOS: 90 U/L (ref 38–126)
ALT: 24 U/L (ref 17–63)
ANION GAP: 11 (ref 5–15)
AST: 61 U/L — ABNORMAL HIGH (ref 15–41)
BUN: 9 mg/dL (ref 6–20)
CALCIUM: 8.8 mg/dL — AB (ref 8.9–10.3)
CO2: 26 mmol/L (ref 22–32)
Chloride: 107 mmol/L (ref 101–111)
Creatinine, Ser: 0.55 mg/dL — ABNORMAL LOW (ref 0.61–1.24)
GFR calc Af Amer: >60 mL/min (ref >60)
GFR calc non Af Amer: >60 mL/min (ref >60)
GLUCOSE: 107 mg/dL — AB (ref 65–99)
Potassium: 3.3 mmol/L — ABNORMAL LOW (ref 3.5–5.1)
SODIUM: 144 mmol/L (ref 135–145)
Total Bilirubin: 0.5 mg/dL (ref 0.3–1.2)
Total Protein: 7.1 g/dL (ref 6.5–8.1)

## 2018-03-14 LAB — CBC WITH DIFFERENTIAL/PLATELET
Basophils Absolute: 0.1 10*3/uL (ref 0.0–0.1)
Basophils Relative: 2 %
Eosinophils Absolute: 0.2 10*3/uL (ref 0.0–0.7)
Eosinophils Relative: 4 %
HEMATOCRIT: 33.8 % — AB (ref 39.0–52.0)
Hemoglobin: 11.5 g/dL — ABNORMAL LOW (ref 13.0–17.0)
LYMPHS PCT: 18 %
Lymphs Abs: 1 10*3/uL (ref 0.7–4.0)
MCH: 34.6 pg — ABNORMAL HIGH (ref 26.0–34.0)
MCHC: 34 g/dL (ref 30.0–36.0)
MCV: 101.8 fL — AB (ref 78.0–100.0)
MONO ABS: 0.7 10*3/uL (ref 0.1–1.0)
MONOS PCT: 13 %
NEUTROS ABS: 3.7 10*3/uL (ref 1.7–7.7)
Neutrophils Relative %: 63 %
Platelets: 217 10*3/uL (ref 150–400)
RBC: 3.32 MIL/uL — ABNORMAL LOW (ref 4.22–5.81)
RDW: 14.4 % (ref 11.5–15.5)
WBC: 5.8 10*3/uL (ref 4.0–10.5)

## 2018-03-14 LAB — PROTIME-INR
INR: 1.05
Prothrombin Time: 13.7 seconds (ref 11.4–15.2)

## 2018-03-14 LAB — APTT: aPTT: 28 s (ref 24–36)

## 2018-03-14 MED ORDER — LIDOCAINE HCL URETHRAL/MUCOSAL 2 % EX GEL
1.0000 "application " | Freq: Once | CUTANEOUS | Status: AC
  Administered 2018-03-14: 1 via TOPICAL
  Filled 2018-03-14: qty 5

## 2018-03-14 MED ORDER — OXYCODONE-ACETAMINOPHEN 5-325 MG PO TABS
1.0000 | ORAL_TABLET | Freq: Once | ORAL | Status: AC
  Administered 2018-03-14: 1 via ORAL
  Filled 2018-03-14: qty 1

## 2018-03-14 MED ORDER — CEPHALEXIN 500 MG PO CAPS: 500.0000 mg | ORAL_CAPSULE | Freq: Two times a day (BID) | ORAL | 0 refills | Status: DC

## 2018-03-14 MED ORDER — OXYMETAZOLINE HCL 0.05 % NA SOLN
1.0000 | Freq: Once | NASAL | Status: AC
  Administered 2018-03-14: 1 via NASAL
  Filled 2018-03-14: qty 15

## 2018-03-14 MED ORDER — ACETAMINOPHEN 325 MG PO TABS
650.0000 mg | ORAL_TABLET | Freq: Once | ORAL | Status: AC
Start: 2018-03-14 — End: 2018-03-14
  Administered 2018-03-14: 650 mg via ORAL
  Filled 2018-03-14: qty 2

## 2018-03-14 MED ORDER — TRANEXAMIC ACID 1000 MG/10ML IV SOLN
500.0000 mg | Freq: Once | INTRAVENOUS | Status: AC
  Administered 2018-03-14: 500 mg via TOPICAL
  Filled 2018-03-14: qty 10

## 2018-03-14 MED ORDER — GI COCKTAIL ~~LOC~~
30.0000 mL | Freq: Once | ORAL | Status: AC
  Administered 2018-03-14: 30 mL via ORAL
  Filled 2018-03-14: qty 30

## 2018-03-14 NOTE — ED Provider Notes (Addendum)
[facility] DEPT Provider Note   CSN: [id number] Arrival date & time: 03/14/18  0315  Time seen 03:57 AM   History   Chief Complaint Chief Complaint  Patient presents with  . Epistaxis    HPI Richard [name] is a 52 y.o. male.  HPI patient is here for his wife.  He states he started having epistaxis the evening of the 11th and then again this morning.  He states this morning it is coming out of both sides.  It awakened him from sleep.  He states yesterday he thinks maybe it was starting on the right side first but he cannot tell for sure.  Wife states he is having more blood clots coming out on the right side.  He states he has had a cough for 1 to 2 months, he denies any fever.  He states he is never had nosebleeds before.  Patient is not on any blood thinners.  He states he has not been on blood pressure medication.  PCP Patient, No Pcp Per   Past Medical History:  Diagnosis Date  . Acid reflux   . Colon cancer ([place])   . Colon cancer ([place])   . PUD (peptic ulcer disease)     Patient Active Problem List   Diagnosis Date Noted  . Occult blood positive stool 11/13/2017  . Alcoholic ketoacidosis 42/35/3614  . Hepatic steatosis 08/21/2016  . Thrombocytopenia ([place]) 08/21/2016  . Alcohol abuse 08/21/2016  . Alcohol withdrawal ([place]) 08/20/2016  . Dehydration 08/20/2016  . Intractable nausea and vomiting 08/20/2016  . Lactic acidosis 08/20/2016  . Aspiration pneumonia ([place]) 08/20/2016  . Sepsis ([place]) 08/20/2016    Past Surgical History:  Procedure Laterality Date  . COLON RESECTION          Home Medications    Prior to Admission medications   Medication Sig Start Date End Date Taking? Authorizing Provider  esomeprazole (NEXIUM) 40 MG capsule Take 40 mg by mouth daily at 12 noon.    [provider]  folic acid (FOLVITE) 1 MG tablet Take 1 tablet (1 mg total) by mouth daily. Patient not taking: Reported on 11/13/2017 08/27/16    [name], MD  labetalol (NORMODYNE) 100 MG tablet Take 1 tablet (100 mg total) by mouth 2 (two) times daily. Patient not taking: Reported on 11/13/2017 08/27/16   [name], MD  ondansetron (ZOFRAN ODT) 8 MG disintegrating tablet Take 1 tablet (8 mg total) by mouth every 8 (eight) hours as needed for nausea or vomiting. Patient not taking: Reported on 11/13/2017 03/25/17   Jola Schmidt, MD    Family History Family History  Problem Relation Age of Onset  . Colon cancer Father   . Cancer Sister   . CAD Neg Hx   . Stroke Neg Hx   . Diabetes Neg Hx     Social History Social History   Tobacco Use  . Smoking status: Current Every Day Smoker    Packs/day: 1.00  . Smokeless tobacco: Never Used  Substance Use Topics  . Alcohol use: Yes    Comment: heavy alcohol abuse a beer and couple of shots a day for past  14 years  . Drug use: No  lives with spouse   Allergies   Aspirin and Penicillins   Review of Systems Review of Systems  All other systems reviewed and are negative.    Physical Exam Updated Vital Signs BP (!) 153/93 (BP Location: Right Arm)   Pulse  94   Temp 97.7 F (36.5 C) (Oral)   Resp 18   Ht 6' (1.829 m)   Wt 65.8 kg (145 lb)   SpO2 100%   BMI 19.67 kg/m   Vital signs normal except hypertension   Physical Exam  Constitutional: He is oriented to person, place, and time. He appears well-developed and well-nourished.  HENT:  Head: Normocephalic and atraumatic.  Right Ear: External ear normal.  Left Ear: External ear normal.  Mouth/Throat: Oropharynx is clear and moist.  Pt has blood in both nares, no obvious bleeding site seen.   Eyes: Pupils are equal, round, and reactive to light. Conjunctivae and EOM are normal.  Cardiovascular: Normal rate and regular rhythm.  Pulmonary/Chest: Effort normal and breath sounds normal. No respiratory distress.  Musculoskeletal: Normal range of motion. He exhibits no deformity.  Neurological: He is alert  and oriented to person, place, and time. No cranial nerve deficit.  Skin: Skin is warm and dry. No pallor.  Psychiatric: He has a normal mood and affect. His behavior is normal. Thought content normal.  Nursing note and vitals reviewed.    ED Treatments / Results  Labs (all labs ordered are listed, but only abnormal results are displayed) Results for orders placed or performed during the hospital encounter of 03/14/18  Comprehensive metabolic panel  Result Value Ref Range   Sodium 144 135 - 145 mmol/L   Potassium 3.3 (L) 3.5 - 5.1 mmol/L   Chloride 107 101 - 111 mmol/L   CO2 26 22 - 32 mmol/L   Glucose, Bld 107 (H) 65 - 99 mg/dL   BUN 9 6 - 20 mg/dL   Creatinine, Ser 0.55 (L) 0.61 - 1.24 mg/dL   Calcium 8.8 (L) 8.9 - 10.3 mg/dL   Total Protein 7.1 6.5 - 8.1 g/dL   Albumin 3.5 3.5 - 5.0 g/dL   AST 61 (H) 15 - 41 U/L   ALT 24 17 - 63 U/L   Alkaline Phosphatase 90 38 - 126 U/L   Total Bilirubin 0.5 0.3 - 1.2 mg/dL   GFR calc non Af Amer >60 >60 mL/min   GFR calc Af Amer >60 >60 mL/min   Anion gap 11 5 - 15  CBC with Differential  Result Value Ref Range   WBC 5.8 4.0 - 10.5 K/uL   RBC 3.32 (L) 4.22 - 5.81 MIL/uL   Hemoglobin 11.5 (L) 13.0 - 17.0 g/dL   HCT 33.8 (L) 39.0 - 52.0 %   MCV 101.8 (H) 78.0 - 100.0 fL   MCH 34.6 (H) 26.0 - 34.0 pg   MCHC 34.0 30.0 - 36.0 g/dL   RDW 14.4 11.5 - 15.5 %   Platelets 217 150 - 400 K/uL   Neutrophils Relative % 63 %   Neutro Abs 3.7 1.7 - 7.7 K/uL   Lymphocytes Relative 18 %   Lymphs Abs 1.0 0.7 - 4.0 K/uL   Monocytes Relative 13 %   Monocytes Absolute 0.7 0.1 - 1.0 K/uL   Eosinophils Relative 4 %   Eosinophils Absolute 0.2 0.0 - 0.7 K/uL   Basophils Relative 2 %   Basophils Absolute 0.1 0.0 - 0.1 K/uL  Protime-INR  Result Value Ref Range   Prothrombin Time 13.7 11.4 - 15.2 seconds   INR 1.05   APTT  Result Value Ref Range   aPTT 28 24 - 36 seconds   Laboratory interpretation all normal except mild anemia that was present in  February    EKG None  Radiology No results found.  Procedures Procedures (including critical care time)  Medications Ordered in ED Medications  tranexamic acid (CYKLOKAPRON) injection 500 mg (500 mg Topical Given 03/14/18 0412)  acetaminophen (TYLENOL) tablet 650 mg (650 mg Oral Given 03/14/18 0443)     Initial Impression / Assessment and Plan / ED Course  I have reviewed the triage vital signs and the nursing notes.  Pertinent labs & imaging results that were available during my care of the patient were reviewed by me and considered in my medical decision making (see chart for details).    04:28 AM TXA pledget was placed in both nostrils.   Recheck at 5:35 AM patient is bleeding through the TXA pledgets on both sides.  I am going to have to place a Merocel sponge or Rhino Rocket.  06:50 AM The pledgets had come out.  Pt now in only trickling blood from his right nostril. Merocel 10 cm sponge was placed in right nostril and was sprayed with TXA and saline to expand the sponge.   Recheck at 7:40 AM the Merocel sponge is soaked in blood and patient states it is dripping blood.  He does not feel like he is having blood go down the back of his throat however.  He is not having any bleeding from the left nostril.  Patient was placed in the patient room and my PA is going to place a [employer] and if that does not stop the bleeding ENT will need to be consulted.  Blood pressure has improved to 144/77 without specific treatment.  Final Clinical Impressions(s) / ED Diagnoses   Final diagnoses:  Right-sided epistaxis    Disposition pending  Rolland Porter, MD, Barbette Or, MD 03/14/18 3295    Rolland Porter, MD 03/14/18 [id number]

## 2018-03-14 NOTE — Discharge Instructions (Addendum)
Please follow up with Dr. Erik Obey with ENT or primary care doctor in 2-3 days for packing removal. Keflex for infection prevention. Return if worsening symptoms.

## 2018-03-14 NOTE — ED Provider Notes (Signed)
Patient in emergency department with epistaxis from right nostril.  Patient was previously packed by Dr. Tomi Bamberger, however he is saturated through [facility] dressing.  She asked me to repack his nostril.  After pulling the Merocel dressing out, large amount of clots was extracted from the right nare.  2 sprays of Afrin applied.  I then packed his right nostril with a Rhino Rocket, 7.5 cm.  Patient tolerated procedure well.  Will monitor for further bleeding.  Marland KitchenEpistaxis Management Date/Time: 03/14/2018 8:19 AM Performed by: Jeannett Senior, PA-C Authorized by: Jeannett Senior, PA-C   Consent:    Consent obtained:  Verbal   Consent given by:  Patient   Risks discussed:  Bleeding, nasal injury and pain   Alternatives discussed:  No treatment Anesthesia (see MAR for exact dosages):    Anesthesia method:  None Procedure details:    Treatment site:  R anterior   Treatment method:  Nasal balloon   Treatment episode: recurring   Post-procedure details:    Assessment:  Bleeding decreased   Patient tolerance of procedure:  Tolerated well, no immediate complications   7:67 AM No bleeding from the nostril. Pt complaining of severe abdominal pain. GI cocktail given, no improvement. States this is a chronic issue. Will have percocet for pain. Otherwise pt is stable for DC home. Will start on keflex and follow up with ENT or PCP. Return precautions discussed.   Vitals:   03/14/18 0321 03/14/18 0715 03/14/18 0832  BP: (!) 153/93 (!) 144/77 (!) 164/96  Pulse: 94  62  Resp: 18  16  Temp: 97.7 F (36.5 C)    TempSrc: Oral    SpO2: 100%  100%  Weight: 65.8 kg (145 lb)    Height: 6' (1.829 m)        Jeannett Senior, PA-C 03/14/18 1606    Charlesetta Shanks, MD 03/16/18 1720

## 2018-03-14 NOTE — ED Triage Notes (Signed)
Pt's nose started bleeding yesterday on and off and he thought it was his allergies, about an hour ago he was unable to get it to stop EMS gave afrin with no relief

## 2018-03-14 NOTE — ED Notes (Signed)
Pt's nose is bleeding again

## 2018-05-01 ENCOUNTER — Encounter (HOSPITAL_COMMUNITY): Payer: Self-pay

## 2018-05-01 ENCOUNTER — Other Ambulatory Visit: Payer: Self-pay

## 2018-05-01 ENCOUNTER — Inpatient Hospital Stay (HOSPITAL_COMMUNITY)
Admission: EM | Admit: 2018-05-01 | Discharge: 2018-05-04 | DRG: 438 | Disposition: A | Discharge status: Home / Self Care | Payer: Self-pay | Attending: Internal Medicine | Admitting: Internal Medicine

## 2018-05-01 ENCOUNTER — Emergency Department (HOSPITAL_COMMUNITY): Payer: Self-pay

## 2018-05-01 DIAGNOSIS — K219 Gastro-esophageal reflux disease without esophagitis: Secondary | ICD-10-CM | POA: Diagnosis present

## 2018-05-01 DIAGNOSIS — K760 Fatty (change of) liver, not elsewhere classified: Secondary | ICD-10-CM | POA: Diagnosis present

## 2018-05-01 DIAGNOSIS — E8729 Other acidosis: Secondary | ICD-10-CM | POA: Diagnosis present

## 2018-05-01 DIAGNOSIS — K852 Alcohol induced acute pancreatitis without necrosis or infection: Principal | ICD-10-CM | POA: Diagnosis present

## 2018-05-01 DIAGNOSIS — Z681 Body mass index (BMI) 19 or less, adult: Secondary | ICD-10-CM

## 2018-05-01 DIAGNOSIS — Z800 Family history of malignant neoplasm of digestive organs: Secondary | ICD-10-CM

## 2018-05-01 DIAGNOSIS — E872 Acidosis: Secondary | ICD-10-CM | POA: Diagnosis present

## 2018-05-01 DIAGNOSIS — Z85038 Personal history of other malignant neoplasm of large intestine: Secondary | ICD-10-CM

## 2018-05-01 DIAGNOSIS — K859 Acute pancreatitis without necrosis or infection, unspecified: Secondary | ICD-10-CM | POA: Diagnosis present

## 2018-05-01 DIAGNOSIS — Z9049 Acquired absence of other specified parts of digestive tract: Secondary | ICD-10-CM

## 2018-05-01 DIAGNOSIS — F1010 Alcohol abuse, uncomplicated: Secondary | ICD-10-CM | POA: Diagnosis present

## 2018-05-01 DIAGNOSIS — F17210 Nicotine dependence, cigarettes, uncomplicated: Secondary | ICD-10-CM | POA: Diagnosis present

## 2018-05-01 DIAGNOSIS — E43 Unspecified severe protein-calorie malnutrition: Secondary | ICD-10-CM

## 2018-05-01 LAB — COMPREHENSIVE METABOLIC PANEL
ALT: 36 U/L (ref 0–44)
AST: 70 U/L — ABNORMAL HIGH (ref 15–41)
Albumin: 3.7 g/dL (ref 3.5–5.0)
Alkaline Phosphatase: 106 U/L (ref 38–126)
Anion gap: 30 — ABNORMAL HIGH (ref 5–15)
BUN: 8 mg/dL (ref 6–20)
CALCIUM: 8.8 mg/dL — AB (ref 8.9–10.3)
CO2: 10 mmol/L — ABNORMAL LOW (ref 22–32)
CREATININE: 0.95 mg/dL (ref 0.61–1.24)
Chloride: 99 mmol/L (ref 98–111)
Glucose, Bld: 76 mg/dL (ref 70–99)
Potassium: 3.7 mmol/L (ref 3.5–5.1)
Sodium: 139 mmol/L (ref 135–145)
Total Bilirubin: 1 mg/dL (ref 0.3–1.2)
Total Protein: 8 g/dL (ref 6.5–8.1)

## 2018-05-01 LAB — CBC WITH DIFFERENTIAL/PLATELET
BASOS ABS: 0.1 10*3/uL (ref 0.0–0.1)
Basophils Relative: 1 %
EOS ABS: 0 10*3/uL (ref 0.0–0.7)
Eosinophils Relative: 0 %
HCT: 39.3 % (ref 39.0–52.0)
Hemoglobin: 12.6 g/dL — ABNORMAL LOW (ref 13.0–17.0)
LYMPHS ABS: 0.7 10*3/uL (ref 0.7–4.0)
Lymphocytes Relative: 7 %
MCH: 31.3 pg (ref 26.0–34.0)
MCHC: 32.1 g/dL (ref 30.0–36.0)
MCV: 97.5 fL (ref 78.0–100.0)
MONO ABS: 0.7 10*3/uL (ref 0.1–1.0)
Monocytes Relative: 7 %
Neutro Abs: 8.5 10*3/uL — ABNORMAL HIGH (ref 1.7–7.7)
Neutrophils Relative %: 85 %
PLATELETS: 308 10*3/uL (ref 150–400)
RBC: 4.03 MIL/uL — ABNORMAL LOW (ref 4.22–5.81)
RDW: 14.4 % (ref 11.5–15.5)
WBC: 10 10*3/uL (ref 4.0–10.5)

## 2018-05-01 LAB — URINALYSIS, ROUTINE W REFLEX MICROSCOPIC
BILIRUBIN URINE: NEGATIVE
Bacteria, UA: NONE SEEN
Glucose, UA: NEGATIVE mg/dL
Ketones, ur: 80 mg/dL — AB
LEUKOCYTES UA: NEGATIVE
NITRITE: NEGATIVE
PH: 5 (ref 5.0–8.0)
Protein, ur: 100 mg/dL — AB
SPECIFIC GRAVITY, URINE: 1.014 (ref 1.005–1.030)

## 2018-05-01 LAB — LIPASE, BLOOD: LIPASE: 158 U/L — AB (ref 11–51)

## 2018-05-01 MED ORDER — TRAMADOL HCL 50 MG PO TABS
50.0000 mg | ORAL_TABLET | Freq: Four times a day (QID) | ORAL | Status: DC | PRN
  Administered 2018-05-02: 50 mg via ORAL
  Filled 2018-05-01: qty 1

## 2018-05-01 MED ORDER — SODIUM CHLORIDE 0.9 % IV SOLN
INTRAVENOUS | Status: DC
Start: 2018-05-01 — End: 2018-05-02

## 2018-05-01 MED ORDER — ONDANSETRON HCL 4 MG/2ML IJ SOLN
4.0000 mg | Freq: Once | INTRAMUSCULAR | Status: AC
  Administered 2018-05-01: 4 mg via INTRAVENOUS
  Filled 2018-05-01: qty 2

## 2018-05-01 MED ORDER — ONDANSETRON HCL 4 MG PO TABS: 4.0000 mg | ORAL_TABLET | Freq: Four times a day (QID) | ORAL | Status: DC | PRN

## 2018-05-01 MED ORDER — HYDROMORPHONE HCL 1 MG/ML IJ SOLN
0.5000 mg | INTRAMUSCULAR | Status: AC | PRN
  Administered 2018-05-01 – 2018-05-02 (×3): 0.5 mg via INTRAVENOUS
  Filled 2018-05-01 (×3): qty 1

## 2018-05-01 MED ORDER — ENOXAPARIN SODIUM 40 MG/0.4ML ~~LOC~~ SOLN
40.0000 mg | SUBCUTANEOUS | Status: DC
  Administered 2018-05-02 – 2018-05-03 (×2): 40 mg via SUBCUTANEOUS
  Filled 2018-05-01 (×2): qty 0.4

## 2018-05-01 MED ORDER — KCL IN DEXTROSE-NACL 20-5-0.45 MEQ/L-%-% IV SOLN
Freq: Once | INTRAVENOUS | Status: AC
Start: 2018-05-01 — End: 2018-05-02
  Administered 2018-05-02: 03:00:00 via INTRAVENOUS
  Filled 2018-05-01: qty 1000

## 2018-05-01 MED ORDER — ONDANSETRON HCL 4 MG/2ML IJ SOLN
4.0000 mg | Freq: Four times a day (QID) | INTRAMUSCULAR | Status: DC | PRN
  Administered 2018-05-02: 4 mg via INTRAVENOUS
  Filled 2018-05-01: qty 2

## 2018-05-01 MED ORDER — THIAMINE HCL 100 MG/ML IJ SOLN
Freq: Once | INTRAVENOUS | Status: AC
  Administered 2018-05-02: 07:00:00 via INTRAVENOUS
  Filled 2018-05-01: qty 1000

## 2018-05-01 MED ORDER — SENNOSIDES-DOCUSATE SODIUM 8.6-50 MG PO TABS: 1.0000 | ORAL_TABLET | Freq: Every evening | ORAL | Status: DC | PRN

## 2018-05-01 MED ORDER — SODIUM CHLORIDE 0.9 % IV BOLUS
1000.0000 mL | Freq: Once | INTRAVENOUS | Status: AC
  Administered 2018-05-01: 1000 mL via INTRAVENOUS

## 2018-05-01 NOTE — ED Triage Notes (Addendum)
Pt BIB [facility] from home c/o increased chronic abdominal pain. He reports loss of appetite, nausea and vomiting over the last 2 weeks. One episode of emesis with EMS. Pt endorses ETOH.   15g oral glucose given en route d/t GBG of 54. A&Ox4.

## 2018-05-01 NOTE — ED Provider Notes (Signed)
[facility] DEPT Provider Note   CSN: [id number] Arrival date & time: 05/01/18  2049     History   Chief Complaint Chief Complaint  Patient presents with  . Abdominal Pain    HPI Richard [name] is a 52 y.o. male.  HPI Patient presents to the emergency room for evaluation of abdominal pain.  Patient states he has had these symptoms for at least the last week or 2.  The pain has been throughout his entire abdomen.  He has had multiple episodes of vomiting and diarrhea.  Patient states it must be at least 1000.  He denies any blood in his vomit or diarrhea.  Nothing seems to make the pain any better.  He  admits to drinking alcohol and had some over the weekend and if anything it made it feel better. Past Medical History:  Diagnosis Date  . Acid reflux   . Colon cancer ([place])   . Colon cancer ([place])   . PUD (peptic ulcer disease)     Patient Active Problem List   Diagnosis Date Noted  . Occult blood positive stool 11/13/2017  . Alcoholic ketoacidosis 16/38/4665  . Hepatic steatosis 08/21/2016  . Thrombocytopenia ([place]) 08/21/2016  . Alcohol abuse 08/21/2016  . Alcohol withdrawal ([place]) 08/20/2016  . Dehydration 08/20/2016  . Intractable nausea and vomiting 08/20/2016  . Lactic acidosis 08/20/2016  . Aspiration pneumonia ([place]) 08/20/2016  . Sepsis ([place]) 08/20/2016    Past Surgical History:  Procedure Laterality Date  . COLON RESECTION          Home Medications    Prior to Admission medications   Medication Sig Start Date End Date Taking? Authorizing Provider  cephALEXin (KEFLEX) 500 MG capsule Take 1 capsule (500 mg total) by mouth 2 (two) times daily. Patient not taking: Reported on 05/01/2018 03/14/18   Jeannett Senior, PA-C  folic acid (FOLVITE) 1 MG tablet Take 1 tablet (1 mg total) by mouth daily. Patient not taking: Reported on 11/13/2017 08/27/16   [name], MD  labetalol (NORMODYNE) 100 MG tablet Take 1 tablet (100 mg  total) by mouth 2 (two) times daily. Patient not taking: Reported on 11/13/2017 08/27/16   [name], MD  ondansetron (ZOFRAN ODT) 8 MG disintegrating tablet Take 1 tablet (8 mg total) by mouth every 8 (eight) hours as needed for nausea or vomiting. Patient not taking: Reported on 11/13/2017 03/25/17   Jola Schmidt, MD    Family History Family History  Problem Relation Age of Onset  . Colon cancer Father   . Cancer Sister   . CAD Neg Hx   . Stroke Neg Hx   . Diabetes Neg Hx     Social History Social History   Tobacco Use  . Smoking status: Current Every Day Smoker    Packs/day: 1.00  . Smokeless tobacco: Never Used  Substance Use Topics  . Alcohol use: Yes    Comment: heavy alcohol abuse a beer and couple of shots a day for past  14 years  . Drug use: No     Allergies   Aspirin and Penicillins   Review of Systems Review of Systems  All other systems reviewed and are negative.    Physical Exam Updated Vital Signs BP (!) 155/75   Pulse 74   Temp 97.7 F (36.5 C) (Oral)   Resp 17   Ht 1.829 m (6')   Wt 65.8 kg (145 lb)   SpO2 100%   BMI 19.67  kg/m   Physical Exam  Constitutional: He appears well-developed and well-nourished. No distress.  HENT:  Head: Normocephalic and atraumatic.  Right Ear: External ear normal.  Left Ear: External ear normal.  Eyes: Conjunctivae are normal. Right eye exhibits no discharge. Left eye exhibits no discharge. No scleral icterus.  Neck: Neck supple. No tracheal deviation present.  Cardiovascular: Normal rate, regular rhythm and intact distal pulses.  Pulmonary/Chest: Effort normal and breath sounds normal. No stridor. No respiratory distress. He has no wheezes. He has no rales.  Abdominal: Soft. Bowel sounds are normal. He exhibits no distension. There is generalized tenderness. There is no rebound and no guarding.  Musculoskeletal: He exhibits no edema or tenderness.  Neurological: He is alert. He has normal strength. No  cranial nerve deficit (no facial droop, extraocular movements intact, no slurred speech) or sensory deficit. He exhibits normal muscle tone. He displays no seizure activity. Coordination normal.  Skin: Skin is warm and dry. No rash noted.  Psychiatric: He has a normal mood and affect.  Nursing note and vitals reviewed.    ED Treatments / Results  Labs (all labs ordered are listed, but only abnormal results are displayed) Labs Reviewed  COMPREHENSIVE METABOLIC PANEL - Abnormal; Notable for the following components:      Result Value   CO2 10 (*)    Calcium 8.8 (*)    AST 70 (*)    Anion gap 30 (*)    All other components within normal limits  LIPASE, BLOOD - Abnormal; Notable for the following components:   Lipase 158 (*)    All other components within normal limits  CBC WITH DIFFERENTIAL/PLATELET - Abnormal; Notable for the following components:   RBC 4.03 (*)    Hemoglobin 12.6 (*)    Neutro Abs 8.5 (*)    All other components within normal limits  URINALYSIS, ROUTINE W REFLEX MICROSCOPIC - Abnormal; Notable for the following components:   Hgb urine dipstick SMALL (*)    Ketones, ur 80 (*)    Protein, ur 100 (*)    All other components within normal limits  SALICYLATE LEVEL  LACTIC ACID, PLASMA  ETHANOL     Radiology Dg Abd Acute W/chest  Result Date: 05/01/2018 CLINICAL DATA:  Mid abdominal pain and vomiting. EXAM: DG ABDOMEN ACUTE W/ 1V CHEST COMPARISON:  Radiographs and CT February 2019 FINDINGS: The cardiomediastinal contours are normal. The lungs are clear. There is no free intra-abdominal air. Generalized paucity of small bowel gas. Enteric sutures in the left abdomen with surgical clips. Air within normal caliber ascending and transverse colon. No radiopaque calculi. No acute osseous abnormalities are seen. IMPRESSION: 1. Clear lungs. 2. Non-specific paucity of small bowel gas. No gaseous dilatation to suggest obstruction. No free air. Electronically Signed   By:  Jeb Levering M.D.   On: 05/01/2018 21:47    Procedures .Critical Care Performed by: Dorie Rank, MD Authorized by: Dorie Rank, MD   Critical care provider statement:    Critical care time (minutes):  30   Critical care was time spent personally by me on the following activities:  Discussions with consultants, evaluation of patient's response to treatment, examination of patient, ordering and performing treatments and interventions, ordering and review of laboratory studies, ordering and review of radiographic studies, pulse oximetry, re-evaluation of patient's condition, obtaining history from patient or surrogate and review of old charts   (including critical care time)  Medications Ordered in ED Medications  sodium chloride 0.9 % bolus 1,000  mL (1,000 mLs Intravenous New Bag/Given 05/01/18 2156)    And  0.9 %  sodium chloride infusion ( Intravenous Hold 05/01/18 2157)  HYDROmorphone (DILAUDID) injection 0.5 mg (0.5 mg Intravenous Given 05/01/18 2153)  dextrose 5 % and 0.45 % NaCl with KCl 20 mEq/L infusion (has no administration in time range)  ondansetron (ZOFRAN) injection 4 mg (4 mg Intravenous Given 05/01/18 2152)     Initial Impression / Assessment and Plan / ED Course  I have reviewed the triage vital signs and the nursing notes.  Pertinent labs & imaging results that were available during my care of the patient were reviewed by me and considered in my medical decision making (see chart for details).  Clinical Course as of May 01 2334  Tue May 01, 2018  2329 Pt states he is still not feeling any better.  Will add on dextrose to his fluids. Add on etoh, salicylate and lactic acid level to evaluate his metabolic acidosis   [JK]    Clinical Course User Index [JK] Dorie Rank, MD    Patient presented to the emergency room for evaluation of persistent abdominal pain.  According to the medical records, the patient has had issues with alcoholic ketoacidosis.  Patient's  laboratory tests are notable for an elevated lipase as well as an anion gap metabolic acidosis.  I suspect he is having issues with pancreatitis and recurrent alcoholic ketoacidosis.  I have added on on lactic acid, ethanol, and salicylate levels.  Patient has been treated with IV fluids and pain medications.  I will start a dextrose infusion.  I will consult the medical service for admission and further treatment  Final Clinical Impressions(s) / ED Diagnoses   Final diagnoses:  Alcohol-induced acute pancreatitis, unspecified complication status  High anion gap metabolic acidosis     Dorie Rank, MD 05/01/18 2339

## 2018-05-01 NOTE — ED Notes (Signed)
Bed: OB79 Expected date:  Expected time:  Means of arrival:  Comments: 87M N/V abd pain, hx colon cancer

## 2018-05-02 ENCOUNTER — Inpatient Hospital Stay (HOSPITAL_COMMUNITY): Payer: Self-pay

## 2018-05-02 ENCOUNTER — Encounter (HOSPITAL_COMMUNITY): Payer: Self-pay | Admitting: *Deleted

## 2018-05-02 DIAGNOSIS — F1010 Alcohol abuse, uncomplicated: Secondary | ICD-10-CM

## 2018-05-02 DIAGNOSIS — E43 Unspecified severe protein-calorie malnutrition: Secondary | ICD-10-CM

## 2018-05-02 DIAGNOSIS — K852 Alcohol induced acute pancreatitis without necrosis or infection: Principal | ICD-10-CM

## 2018-05-02 HISTORY — DX: Unspecified severe protein-calorie malnutrition: E43

## 2018-05-02 LAB — CBC
HEMATOCRIT: 35.7 % — AB (ref 39.0–52.0)
HEMOGLOBIN: 11.4 g/dL — AB (ref 13.0–17.0)
MCH: 30.8 pg (ref 26.0–34.0)
MCHC: 31.9 g/dL (ref 30.0–36.0)
MCV: 96.5 fL (ref 78.0–100.0)
Platelets: 275 10*3/uL (ref 150–400)
RBC: 3.7 MIL/uL — AB (ref 4.22–5.81)
RDW: 14.2 % (ref 11.5–15.5)
WBC: 9.6 10*3/uL (ref 4.0–10.5)

## 2018-05-02 LAB — COMPREHENSIVE METABOLIC PANEL
ALK PHOS: 95 U/L (ref 38–126)
ALT: 32 U/L (ref 0–44)
ANION GAP: 25 — AB (ref 5–15)
AST: 62 U/L — ABNORMAL HIGH (ref 15–41)
Albumin: 3.4 g/dL — ABNORMAL LOW (ref 3.5–5.0)
BUN: 7 mg/dL (ref 6–20)
CALCIUM: 8.3 mg/dL — AB (ref 8.9–10.3)
CO2: 11 mmol/L — AB (ref 22–32)
Chloride: 104 mmol/L (ref 98–111)
Creatinine, Ser: 1.04 mg/dL (ref 0.61–1.24)
GFR calc non Af Amer: >60 mL/min (ref >60)
GLUCOSE: 81 mg/dL (ref 70–99)
POTASSIUM: 4.3 mmol/L (ref 3.5–5.1)
SODIUM: 140 mmol/L (ref 135–145)
Total Bilirubin: 1.4 mg/dL — ABNORMAL HIGH (ref 0.3–1.2)
Total Protein: 7.5 g/dL (ref 6.5–8.1)

## 2018-05-02 LAB — SALICYLATE LEVEL: Salicylate Lvl: <7 mg/dL (ref 2.8–30.0)

## 2018-05-02 LAB — LACTIC ACID, PLASMA: Lactic Acid, Venous: 1.8 mmol/L (ref 0.5–1.9)

## 2018-05-02 LAB — LIPASE, BLOOD: Lipase: 200 U/L — ABNORMAL HIGH (ref 11–51)

## 2018-05-02 LAB — ETHANOL: Alcohol, Ethyl (B): 76 mg/dL — ABNORMAL HIGH (ref <10)

## 2018-05-02 MED ORDER — VITAMIN B-1 100 MG PO TABS
100.0000 mg | ORAL_TABLET | Freq: Every day | ORAL | Status: DC
  Administered 2018-05-02 – 2018-05-04 (×3): 100 mg via ORAL
  Filled 2018-05-02 (×3): qty 1

## 2018-05-02 MED ORDER — FOLIC ACID 1 MG PO TABS
1.0000 mg | ORAL_TABLET | Freq: Every day | ORAL | Status: DC
  Administered 2018-05-02 – 2018-05-04 (×3): 1 mg via ORAL
  Filled 2018-05-02 (×3): qty 1

## 2018-05-02 MED ORDER — MORPHINE SULFATE (PF) 4 MG/ML IV SOLN
4.0000 mg | INTRAVENOUS | Status: DC | PRN
  Administered 2018-05-02 – 2018-05-04 (×8): 4 mg via INTRAVENOUS
  Filled 2018-05-02 (×8): qty 1

## 2018-05-02 MED ORDER — IOPAMIDOL (ISOVUE-300) INJECTION 61%
INTRAVENOUS | Status: AC
  Administered 2018-05-02: 15 mL
  Filled 2018-05-02: qty 30

## 2018-05-02 MED ORDER — THIAMINE HCL 100 MG/ML IJ SOLN: 100.0000 mg | Freq: Every day | INTRAMUSCULAR | Status: DC

## 2018-05-02 MED ORDER — IOPAMIDOL (ISOVUE-300) INJECTION 61%
INTRAVENOUS | Status: AC
  Administered 2018-05-02: 15 mL
  Filled 2018-05-02: qty 100

## 2018-05-02 MED ORDER — ADULT MULTIVITAMIN W/MINERALS CH
1.0000 | ORAL_TABLET | Freq: Every day | ORAL | Status: DC
  Administered 2018-05-02 – 2018-05-04 (×3): 1 via ORAL
  Filled 2018-05-02 (×3): qty 1

## 2018-05-02 MED ORDER — IOPAMIDOL (ISOVUE-300) INJECTION 61%
100.0000 mL | Freq: Once | INTRAVENOUS | Status: AC | PRN
  Administered 2018-05-02: 100 mL via INTRAVENOUS

## 2018-05-02 MED ORDER — LORAZEPAM 2 MG/ML IJ SOLN
1.0000 mg | Freq: Four times a day (QID) | INTRAMUSCULAR | Status: DC | PRN
  Administered 2018-05-02: 1 mg via INTRAVENOUS
  Filled 2018-05-02: qty 1

## 2018-05-02 MED ORDER — OXYCODONE HCL 5 MG PO TABS
10.0000 mg | ORAL_TABLET | Freq: Once | ORAL | Status: AC
  Administered 2018-05-02: 10 mg via ORAL
  Filled 2018-05-02: qty 2

## 2018-05-02 MED ORDER — SODIUM CHLORIDE 0.9 % IV SOLN
INTRAVENOUS | Status: DC
  Administered 2018-05-02 – 2018-05-04 (×6): via INTRAVENOUS

## 2018-05-02 MED ORDER — NICOTINE 14 MG/24HR TD PT24
14.0000 mg | MEDICATED_PATCH | TRANSDERMAL | Status: DC
  Administered 2018-05-02 – 2018-05-03 (×2): 14 mg via TRANSDERMAL
  Filled 2018-05-02 (×2): qty 1

## 2018-05-02 MED ORDER — FAMOTIDINE IN NACL 20-0.9 MG/50ML-% IV SOLN
20.0000 mg | Freq: Two times a day (BID) | INTRAVENOUS | Status: DC
  Administered 2018-05-02 – 2018-05-04 (×6): 20 mg via INTRAVENOUS
  Filled 2018-05-02 (×6): qty 50

## 2018-05-02 MED ORDER — BOOST / RESOURCE BREEZE PO LIQD CUSTOM
1.0000 | Freq: Three times a day (TID) | ORAL | Status: DC
  Administered 2018-05-02: 1 via ORAL
  Administered 2018-05-02: 21:00:00 via ORAL
  Administered 2018-05-03: 1 via ORAL

## 2018-05-02 MED ORDER — LABETALOL HCL 100 MG PO TABS
100.0000 mg | ORAL_TABLET | Freq: Two times a day (BID) | ORAL | Status: DC
  Administered 2018-05-02 – 2018-05-04 (×5): 100 mg via ORAL
  Filled 2018-05-02 (×5): qty 1

## 2018-05-02 MED ORDER — LORAZEPAM 1 MG PO TABS: 1.0000 mg | ORAL_TABLET | Freq: Four times a day (QID) | ORAL | Status: DC | PRN

## 2018-05-02 MED ORDER — IOPAMIDOL (ISOVUE-300) INJECTION 61%: 15.0000 mL | Freq: Once | INTRAVENOUS | Status: AC | PRN

## 2018-05-02 NOTE — Consult Note (Signed)
Referring Provider:  Dr. Tawanna Solo, [facility] Primary Care Physician:  Patient, No Pcp Per Primary Gastroenterologist:  Dr. Henrene Pastor in 2007  Reason for Consultation:  Abdominal pain and elevated lipase  HPI: Richard [name] is a 52 y.o. male with history of colon cancer in 2007 diagnosed by Dr. Henrene Pastor.  Since then he has followed with [facility] and had a colonoscopy in 2015, but according to [facility] office he has been discharged from [place] practices.  Anyway, he also has medical history of ETOH abuse.  Came to hospital with complaints of abdominal pain for the past 14 days that has worsened in intensity.  Says that it actually seemed to start in mid-lower abdomen but has moved up.  Describes as sharp pains.  Had a lot of nausea and vomiting as well.  Tells me that nothing he has received for pain has helped since he has been here.  Lipase was 158 on admission and is 200 today.  Only abdominal x-ray performed.  Denies NSAID use.  Says that he takes medication for acid reflux at home but that is not on his medication list.  Is on pepcid 20 mg BID here.   Past Medical History:  Diagnosis Date  . Acid reflux   . Colon cancer ([place])   . Colon cancer ([place])   . PUD (peptic ulcer disease)     Past Surgical History:  Procedure Laterality Date  . COLON RESECTION      Prior to Admission medications   Medication Sig Start Date End Date Taking? Authorizing Provider  cephALEXin (KEFLEX) 500 MG capsule Take 1 capsule (500 mg total) by mouth 2 (two) times daily. Patient not taking: Reported on 05/01/2018 03/14/18   Jeannett Senior, PA-C  folic acid (FOLVITE) 1 MG tablet Take 1 tablet (1 mg total) by mouth daily. Patient not taking: Reported on 11/13/2017 08/27/16   [name], MD  labetalol (NORMODYNE) 100 MG tablet Take 1 tablet (100 mg total) by mouth 2 (two) times daily. Patient not taking: Reported on 11/13/2017 08/27/16   [name], MD  ondansetron (ZOFRAN ODT) 8 MG disintegrating tablet Take 1  tablet (8 mg total) by mouth every 8 (eight) hours as needed for nausea or vomiting. Patient not taking: Reported on 11/13/2017 03/25/17   Jola Schmidt, MD    Current Facility-Administered Medications  Medication Dose Route Frequency Provider Last Rate Last Dose  . 0.9 %  sodium chloride infusion   Intravenous Continuous Tomma Rakers, MD 125 mL/hr at 05/02/18 [phone]    . enoxaparin (LOVENOX) injection 40 mg  40 mg Subcutaneous Q24H Hollice Gong, Mir Mohammed, MD   40 mg at 05/02/18 0945  . famotidine (PEPCID) IVPB 20 mg premix  20 mg Intravenous Q12H Tomma Rakers, MD 100 mL/hr at 05/02/18 0943 20 mg at 05/02/18 0943  . feeding supplement (BOOST / RESOURCE BREEZE) liquid 1 Container  1 Container Oral TID BM Shelly Coss, MD   1 Container at 05/02/18 1205  . folic acid (FOLVITE) tablet 1 mg  1 mg Oral Daily Hollice Gong, Mir Mohammed, MD   1 mg at 05/02/18 [phone]  . labetalol (NORMODYNE) tablet 100 mg  100 mg Oral BID Tomma Rakers, MD   100 mg at 05/02/18 0347  . LORazepam (ATIVAN) tablet 1 mg  1 mg Oral Q6H PRN Tomma Rakers, MD       Or  . LORazepam (ATIVAN) injection 1 mg  1 mg Intravenous Q6H PRN Hollice Gong, Mir  Mohammed, MD      . morphine 4 MG/ML injection 4 mg  4 mg Intravenous Q4H PRN Shelly Coss, MD   4 mg at 05/02/18 1409  . multivitamin with minerals tablet 1 tablet  1 tablet Oral Daily Tomma Rakers, MD   1 tablet at 05/02/18 0944  . ondansetron (ZOFRAN) tablet 4 mg  4 mg Oral Q6H PRN Hollice Gong, Mir Mohammed, MD       Or  . ondansetron [facility]) injection 4 mg  4 mg Intravenous Q6H PRN Tomma Rakers, MD   4 mg at 05/02/18 0713  . senna-docusate (Senokot-S) tablet 1 tablet  1 tablet Oral QHS PRN Hollice Gong, Mir Mohammed, MD      . thiamine (VITAMIN B-1) tablet 100 mg  100 mg Oral Daily Hollice Gong, Mir Mohammed, MD   100 mg at 05/02/18 5397   Or  . thiamine (B-1) injection 100 mg  100 mg Intravenous Daily Tomma Rakers, MD        Allergies as of 05/01/2018 - Review Complete 05/01/2018  Allergen Reaction Noted  . Aspirin Other (See Comments) 08/20/2016  . Penicillins Hives 01/09/2012    Family History  Problem Relation Age of Onset  . Colon cancer Father   . Cancer Sister   . CAD Neg Hx   . Stroke Neg Hx   . Diabetes Neg Hx     Social History   Socioeconomic History  . Marital status: Legally Separated    Spouse name: Not on file  . Number of children: Not on file  . Years of education: Not on file  . Highest education level: Not on file  Occupational History  . Not on file  Social Needs  . Financial resource strain: Not on file  . Food insecurity:    Worry: Not on file    Inability: Not on file  . Transportation needs:    Medical: Not on file    Non-medical: Not on file  Tobacco Use  . Smoking status: Current Every Day Smoker    Packs/day: 1.00  . Smokeless tobacco: Never Used  Substance and Sexual Activity  . Alcohol use: Yes    Comment: heavy alcohol abuse a beer and couple of shots a day for past  14 years  . Drug use: No  . Sexual activity: Not on file  Lifestyle  . Physical activity:    Days per week: Not on file    Minutes per session: Not on file  . Stress: Not on file  Relationships  . Social connections:    Talks on phone: Not on file    Gets together: Not on file    Attends religious service: Not on file    Active member of club or organization: Not on file    Attends meetings of clubs or organizations: Not on file    Relationship status: Not on file  . Intimate partner violence:    Fear of current or ex partner: Not on file    Emotionally abused: Not on file    Physically abused: Not on file    Forced sexual activity: Not on file  Other Topics Concern  . Not on file  Social History Narrative  . Not on file   Review of Systems: ROS is O/W negative except as mentioned in HPI.  Physical Exam: Vital signs in last 24 hours: Temp:  [97.7 F  (36.5 C)] 97.7 F (36.5 C) (07/31 0207) Pulse Rate:  [67-83] 68 (07/31  2637) Resp:  [14-20] 20 (07/31 0207) BP: (130-157)/(74-92) 138/74 (07/31 0207) SpO2:  [99 %-100 %] 100 % (07/31 0207) Weight:  [140 lb (63.5 kg)-145 lb (65.8 kg)] 140 lb (63.5 kg) (07/31 0200) Last BM Date: 05/01/18 General:   Alert,  Well-developed, well-nourished, pleasant and cooperative in NAD Head:  Normocephalic and atraumatic. Eyes:  Sclera clear, no icterus.   Conjunctiva pink. Ears:  Normal auditory acuity. Mouth:  No deformity or lesions.   Neck:  Supple; no masses or thyromegaly. Lungs:  Clear throughout to auscultation.   No wheezes, crackles, or rhonchi.  Heart:  Regular rate and rhythm; no murmurs, clicks, rubs,  or gallops. Abdomen:  Soft,nontender, BS active,nonpalp mass or hsm.   Rectal:  Deferred  Msk:  Symmetrical without gross deformities. . Pulses:  Normal pulses noted. Extremities:  Without clubbing or edema. Neurologic:  Alert and  oriented x4;  grossly normal neurologically. Skin:  Intact without significant lesions or rashes.. Psych:  Alert and cooperative. Normal mood and affect.  Intake/Output from previous day: 07/30 0701 - 07/31 0700 In: 1246.8 [I.V.:246.8; IV Piggyback:1000] Out: -  Intake/Output this shift: Total I/O In: 720 [P.O.:720] Out: -   Lab Results: Recent Labs    05/01/18 2155 05/02/18 0235  WBC 10.0 9.6  HGB 12.6* 11.4*  HCT 39.3 35.7*  PLT 308 275   BMET Recent Labs    05/01/18 2155 05/02/18 0235  NA 139 140  K 3.7 4.3  CL 99 104  CO2 10* 11*  GLUCOSE 76 81  BUN 8 7  CREATININE 0.95 1.04  CALCIUM 8.8* 8.3*   LFT Recent Labs    05/02/18 0235  PROT 7.5  ALBUMIN 3.4*  AST 62*  ALT 32  ALKPHOS 95  BILITOT 1.4*   Studies/Results: Dg Abd Acute W/chest  Result Date: 05/01/2018 CLINICAL DATA:  Mid abdominal pain and vomiting. EXAM: DG ABDOMEN ACUTE W/ 1V CHEST COMPARISON:  Radiographs and CT February 2019 FINDINGS: The cardiomediastinal  contours are normal. The lungs are clear. There is no free intra-abdominal air. Generalized paucity of small bowel gas. Enteric sutures in the left abdomen with surgical clips. Air within normal caliber ascending and transverse colon. No radiopaque calculi. No acute osseous abnormalities are seen. IMPRESSION: 1. Clear lungs. 2. Non-specific paucity of small bowel gas. No gaseous dilatation to suggest obstruction. No free air. Electronically Signed   By: Jeb Levering M.D.   On: 05/01/2018 21:47   IMPRESSION:  *52 year old male with history of ETOH abuse who presented with abdominal pain, nausea, and vomiting for 14 days that has worsened.  Lipase is elevated.  ? ETOH pancreatitis. *History of colon cancer in 2007.  Last colonoscopy 2015 at [facility].  PLAN: *Will check CT scan abdomen and pelvis with contrast. *O/W continue supportive care for now with IVF's, pain medication, anti-emetics.  Laban Emperor. Kenny Rea  05/02/2018, 2:10 PM

## 2018-05-02 NOTE — Progress Notes (Signed)
Initial Nutrition Assessment  DOCUMENTATION CODES:   Severe malnutrition in context of chronic illness  INTERVENTION:   -Recommend weight patient for admission.  -Provide Boost Breeze po TID, each supplement provides 250 kcal and 9 grams of protein  NUTRITION DIAGNOSIS:   Severe Malnutrition related to chronic illness(ETOH, pancreatitis) as evidenced by severe fat depletion, severe muscle depletion, energy intake < or equal to 50% for > or equal to 5 days.  GOAL:   Patient will meet greater than or equal to 90% of their needs  MONITOR:   PO intake, Supplement acceptance, Labs, Weight trends, I & O's  REASON FOR ASSESSMENT:   Malnutrition Screening Tool   ASSESSMENT:   52 y.o. male with medical history significant for daily EtOH abuse, PUD presented with abdominal pain found to have mild pancreatitis and likely alcoholic starvation ketosis. He says he started having lower abdominal pain with radiation to the epigastrum about 10 days ago, he has also been vomiting and having diarrhea, though he says he vomits every morning due to phlegm. He says now that his last drink of EtOH was yesterday 7/29.  Patient reports still feeling pain. States morphine is not controlling his pain. Clear liquid tray consumed ~95%. Currently tolerating liquids but states he still has some nausea. No vomiting since PTA. Pt reports no PO for 10 days PTA, but he was drinking ETOH. Is willing to try Boost Breeze supplements while on clear liquids.   Pt reports his UBW is around 140 lb. That is his current weight for this admission, pt feels that he weighs less than this now. Recommend weighing patient again.   Labs reviewed. Medications: Folic acid tablet daily, Multivitamin with minerals daily , Thiamine tablet daily, IV Zofran PRN  NUTRITION - FOCUSED PHYSICAL EXAM:    Most Recent Value  Orbital Region  Mild depletion  Upper Arm Region  Severe depletion  Thoracic and Lumbar Region  Unable to assess   Buccal Region  Mild depletion  Temple Region  Moderate depletion  Clavicle Bone Region  Severe depletion  Clavicle and Acromion Bone Region  Severe depletion  Scapular Bone Region  Unable to assess  Dorsal Hand  Moderate depletion  Patellar Region  Moderate depletion  Anterior Thigh Region  Unable to assess  Posterior Calf Region  Moderate depletion  Edema (RD Assessment)  None       Diet Order:   Diet Order           Diet clear liquid Room service appropriate? Yes; Fluid consistency: Thin  Diet effective now          EDUCATION NEEDS:   Education needs have been addressed  Skin:  Skin Assessment: Reviewed RN Assessment  Last BM:  7/30  Height:   Ht Readings from Last 1 Encounters:  05/02/18 6' (1.829 m)    Weight:   Wt Readings from Last 1 Encounters:  05/02/18 140 lb (63.5 kg)    Ideal Body Weight:  80.9 kg  BMI:  Body mass index is 18.99 kg/m.  Estimated Nutritional Needs:   Kcal:  1900-2100  Protein:  100-110g  Fluid:  2L/day  Clayton Bibles, MS, RD, LDN [place] [facility] Pager: [phone] After Hours Pager: [phone]

## 2018-05-02 NOTE — Progress Notes (Signed)
Patient has had 2nd loose, watery stool today.  MD notified via text page.

## 2018-05-02 NOTE — Progress Notes (Signed)
PROGRESS NOTE    KRISHNA DANCEL  MEQ:[id number] DOB: 10-20-1965 DOA: 05/01/2018 PCP: Patient, No Pcp Per   Brief Narrative: Patient is a 52 year old male with past medical history significant for chronic alcohol abuse, peptic ulcer disease , history of colon cancer status post resection who presented with abdominal pain patient was found to have elevated lipase level, metabolic acidosis due to alcohol.  Admitted for management of acute pancreatitis.  He has been admitted in the past with abdominal pain.  Assessment & Plan:   Active Problems:   Hepatic steatosis   Alcohol abuse   Alcoholic ketoacidosis   Pancreatitis   Protein-calorie malnutrition, severe  Acute pancreatitis: Mild elevation in lipase.  Mild elevated LFTs.  Continues to complain of abdominal pain,mailny epigastric.  Continue pain management.  Continue IV fluids.  Started on clear liquid diet today. Also has history of peptic ulcer disease.  Pain Could also be alcoholic gastritis.  I have requested for GI evaluation.  Chronic alcohol abuse: Drinks daily.  He usually drinks vodka.  He is interested on alcohol rehabilitation.  Social worker requested for providing the resources. We will continue to monitor for withdrawal.  History of colon cancer: Currently in remission.  Status post colon resection.  Needs GI follow-up as an outpatient as well  Metabolic acidosis/Alcoholic ketoacidosis: Continue IV fluids.  Alcohol level was elevated on presentation.   DVT prophylaxis:SCD Code Status: Full Family Communication: None present at the bedside Disposition Plan: Home in 1 to 2 days   Consultants: GI  Procedures: None  Antimicrobials: None  Subjective: Patient seen and examined the bedside this morning.  Continues to complain of severe epigastric abdominal pain.  Objective: Vitals:   05/02/18 0100 05/02/18 0130 05/02/18 0200 05/02/18 0207  BP: 140/82 (!) 157/87  138/74  Pulse: 77 71  68  Resp: 16 14  20   Temp:     97.7 F (36.5 C)  TempSrc:      SpO2: 99% 100%  100%  Weight:   63.5 kg (140 lb)   Height:   6' (1.829 m)     Intake/Output Summary (Last 24 hours) at 05/02/2018 1347 Last data filed at 05/02/2018 1200 Gross per 24 hour  Intake 1966.79 ml  Output -  Net 1966.79 ml   Filed Weights   05/01/18 2107 05/02/18 0200  Weight: 65.8 kg (145 lb) 63.5 kg (140 lb)    Examination:  General exam: In moderate  Distress due to pain,thin built HEENT:PERRL,Oral mucosa moist, Ear/Nose normal on gross exam Respiratory system: Bilateral equal air entry, normal vesicular breath sounds, no wheezes or crackles  Cardiovascular system: S1 & S2 heard, RRR. No JVD, murmurs, rubs, gallops or clicks. No pedal edema. Gastrointestinal system: Abdomen is nondistended, soft .  Tenderness in the epigastric region. No organomegaly or masses felt. Normal bowel sounds heard. Central nervous system: Alert and oriented. No focal neurological deficits. Extremities: No edema, no clubbing ,no cyanosis, distal peripheral pulses palpable. Skin: No rashes, lesions or ulcers,no icterus ,no pallor MSK: Normal muscle bulk,tone ,power Psychiatry: Judgement and insight appear normal. Mood & affect appropriate.     Data Reviewed: I have personally reviewed following labs and imaging studies  CBC: Recent Labs  Lab 05/01/18 2155 05/02/18 0235  WBC 10.0 9.6  NEUTROABS 8.5*  --   HGB 12.6* 11.4*  HCT 39.3 35.7*  MCV 97.5 96.5  PLT 308 297   Basic Metabolic Panel: Recent Labs  Lab 05/01/18 2155 05/02/18 0235  NA 139 140  K 3.7 4.3  CL 99 104  CO2 10* 11*  GLUCOSE 76 81  BUN 8 7  CREATININE 0.95 1.04  CALCIUM 8.8* 8.3*   GFR: Estimated Creatinine Clearance: 76.3 mL/min (by C-G formula based on SCr of 1.04 mg/dL). Liver Function Tests: Recent Labs  Lab 05/01/18 2155 05/02/18 0235  AST 70* 62*  ALT 36 32  ALKPHOS 106 95  BILITOT 1.0 1.4*  PROT 8.0 7.5  ALBUMIN 3.7 3.4*   Recent Labs  Lab  05/01/18 2155 05/02/18 0235  LIPASE 158* 200*   No results for input(s): AMMONIA in the last 168 hours. Coagulation Profile: No results for input(s): INR, PROTIME in the last 168 hours. Cardiac Enzymes: No results for input(s): CKTOTAL, CKMB, CKMBINDEX, TROPONINI in the last 168 hours. BNP (last 3 results) No results for input(s): PROBNP in the last 8760 hours. HbA1C: No results for input(s): HGBA1C in the last 72 hours. CBG: No results for input(s): GLUCAP in the last 168 hours. Lipid Profile: No results for input(s): CHOL, HDL, LDLCALC, TRIG, CHOLHDL, LDLDIRECT in the last 72 hours. Thyroid Function Tests: No results for input(s): TSH, T4TOTAL, FREET4, T3FREE, THYROIDAB in the last 72 hours. Anemia Panel: No results for input(s): VITAMINB12, FOLATE, FERRITIN, TIBC, IRON, RETICCTPCT in the last 72 hours. Sepsis Labs: Recent Labs  Lab 05/02/18 0235  LATICACIDVEN 1.8    No results found for this or any previous visit (from the past 240 hour(s)).       Radiology Studies: Dg Abd Acute W/chest  Result Date: 05/01/2018 CLINICAL DATA:  Mid abdominal pain and vomiting. EXAM: DG ABDOMEN ACUTE W/ 1V CHEST COMPARISON:  Radiographs and CT February 2019 FINDINGS: The cardiomediastinal contours are normal. The lungs are clear. There is no free intra-abdominal air. Generalized paucity of small bowel gas. Enteric sutures in the left abdomen with surgical clips. Air within normal caliber ascending and transverse colon. No radiopaque calculi. No acute osseous abnormalities are seen. IMPRESSION: 1. Clear lungs. 2. Non-specific paucity of small bowel gas. No gaseous dilatation to suggest obstruction. No free air. Electronically Signed   By: Jeb Levering M.D.   On: 05/01/2018 21:47        Scheduled Meds: . enoxaparin (LOVENOX) injection  40 mg Subcutaneous Q24H  . feeding supplement  1 Container Oral TID BM  . folic acid  1 mg Oral Daily  . labetalol  100 mg Oral BID  . multivitamin  with minerals  1 tablet Oral Daily  . thiamine  100 mg Oral Daily   Or  . thiamine  100 mg Intravenous Daily   Continuous Infusions: . sodium chloride 125 mL/hr at 05/02/18 0658  . famotidine (PEPCID) IV 20 mg (05/02/18 0943)     LOS: 0 days    Time spent: 25 mins.More than 50% of that time was spent in counseling and/or coordination of care.      Shelly Coss, MD [facility] Pager [phone]  If 7PM-7AM, please contact night-coverage [email or web address] Password [email or web address] 05/02/2018, 1:47 PM

## 2018-05-02 NOTE — H&P (Signed)
History and Physical  ALDIN DREES CBJ:[id number] DOB: 01/05/1966 DOA: 05/01/2018   PCP: Patient, No Pcp Per   Patient coming from: Home   Chief Complaint: abd pain   HPI: Richard [name] is a 52 y.o. male with medical history significant for daily EtOH abuse, PUD presented with abdominal pain found to have mild pancreatitis and likely alcoholic starvation ketosis. He says he started having lower abdominal pain with radiation to the epigastrum about 10 days ago, he has also been vomiting and having diarrhea, though he says he vomits every morning due to phlegm. He says now that his last drink of EtOH was yesterday 7/29. Currently he is asking if he can have more pain medication for his abdominal pain.   ED Course: Labs in the ER consistent with mild pancreatitis and metabolic acidosis. Hospitalist asked to admit.  Review of Systems: Please see HPI for pertinent positives and negatives. A complete 10 system review of systems are otherwise negative.  Past Medical History:  Diagnosis Date  . Acid reflux   . Colon cancer ([place])   . Colon cancer ([place])   . PUD (peptic ulcer disease)    Past Surgical History:  Procedure Laterality Date  . COLON RESECTION      Social History:  reports that he has been smoking.  He has been smoking about 1.00 pack per day. He has never used smokeless tobacco. He reports that he drinks alcohol. He reports that he does not use drugs.   Allergies  Allergen Reactions  . Aspirin Other (See Comments)    Acid reflux   . Penicillins Hives    Has patient had a PCN reaction causing immediate rash, facial/tongue/throat swelling, SOB or lightheadedness with hypotension: yes Has patient had a PCN reaction causing severe rash involving mucus membranes or skin necrosis: no Has patient had a PCN reaction that required hospitalization: no Has patient had a PCN reaction occurring within the last 10 years: no If all of the above answers are "NO", then may proceed with  Cephalosporin use.     Family History  Problem Relation Age of Onset  . Colon cancer Father   . Cancer Sister   . CAD Neg Hx   . Stroke Neg Hx   . Diabetes Neg Hx      Prior to Admission medications   Medication Sig Start Date End Date Taking? Authorizing Provider  cephALEXin (KEFLEX) 500 MG capsule Take 1 capsule (500 mg total) by mouth 2 (two) times daily. Patient not taking: Reported on 05/01/2018 03/14/18   Jeannett Senior, PA-C  folic acid (FOLVITE) 1 MG tablet Take 1 tablet (1 mg total) by mouth daily. Patient not taking: Reported on 11/13/2017 08/27/16   [name], MD  labetalol (NORMODYNE) 100 MG tablet Take 1 tablet (100 mg total) by mouth 2 (two) times daily. Patient not taking: Reported on 11/13/2017 08/27/16   [name], MD  ondansetron (ZOFRAN ODT) 8 MG disintegrating tablet Take 1 tablet (8 mg total) by mouth every 8 (eight) hours as needed for nausea or vomiting. Patient not taking: Reported on 11/13/2017 03/25/17   Jola Schmidt, MD    Physical Exam: BP (!) 155/75   Pulse 74   Temp 97.7 F (36.5 C) (Oral)   Resp 17   Ht 6' (1.829 m)   Wt 65.8 kg (145 lb)   SpO2 100%   BMI 19.67 kg/m   General:  Alert, oriented, calm, in no acute distress, looks comfortable Eyes:  EOMI, clear conjuctivae, white sclerea Neck: supple, no masses, trachea mildline  Cardiovascular: RRR, no murmurs or rubs, no peripheral edema  Respiratory: clear to auscultation bilaterally, no wheezes, no crackles  Abdomen: soft, tender in epigastrum, nondistended, normal bowel tones heard  Skin: dry, no rashes  Musculoskeletal: no joint effusions, normal range of motion  Psychiatric: appropriate affect, normal speech  Neurologic: extraocular muscles intact, clear speech, moving all extremities with intact sensorium            Labs on Admission:  Basic Metabolic Panel: Recent Labs  Lab 05/01/18 2155  NA 139  K 3.7  CL 99  CO2 10*  GLUCOSE 76  BUN 8  CREATININE 0.95    CALCIUM 8.8*   Liver Function Tests: Recent Labs  Lab 05/01/18 2155  AST 70*  ALT 36  ALKPHOS 106  BILITOT 1.0  PROT 8.0  ALBUMIN 3.7   Recent Labs  Lab 05/01/18 2155  LIPASE 158*   No results for input(s): AMMONIA in the last 168 hours. CBC: Recent Labs  Lab 05/01/18 2155  WBC 10.0  NEUTROABS 8.5*  HGB 12.6*  HCT 39.3  MCV 97.5  PLT 308   Cardiac Enzymes: No results for input(s): CKTOTAL, CKMB, CKMBINDEX, TROPONINI in the last 168 hours.  BNP (last 3 results) No results for input(s): BNP in the last 8760 hours.  ProBNP (last 3 results) No results for input(s): PROBNP in the last 8760 hours.  CBG: No results for input(s): GLUCAP in the last 168 hours.  Radiological Exams on Admission: Dg Abd Acute W/chest  Result Date: 05/01/2018 CLINICAL DATA:  Mid abdominal pain and vomiting. EXAM: DG ABDOMEN ACUTE W/ 1V CHEST COMPARISON:  Radiographs and CT February 2019 FINDINGS: The cardiomediastinal contours are normal. The lungs are clear. There is no free intra-abdominal air. Generalized paucity of small bowel gas. Enteric sutures in the left abdomen with surgical clips. Air within normal caliber ascending and transverse colon. No radiopaque calculi. No acute osseous abnormalities are seen. IMPRESSION: 1. Clear lungs. 2. Non-specific paucity of small bowel gas. No gaseous dilatation to suggest obstruction. No free air. Electronically Signed   By: Jeb Levering M.D.   On: 05/01/2018 21:47    Assessment/Plan Present on Admission: . Pancreatitis . Hepatic steatosis . Alcohol abuse . Alcoholic ketoacidosis  52 year old [place] American male with history of daily alcohol abuse being admitted with abdominal pain likely due to mild pancreatitis and suspected recurrent PUD, as well as alcoholic ketoacidosis. - observation admission - IVF and NPO for pancreatitis - follow lipase and LFTs in AM - consider GI consult in AM if pain persists as he may benefit from EGD - IV  famotidine empirically Elevated LFT - likely due to recent EtOH abuse last on 7/29   Hepatic steatosis   Alcohol abuse - place on Ativan per CIWA protocol, currently no s/s of withdrawal   Alcoholic ketoacidosis   Pancreatitis  DVT prophylaxis: Lovenox   Code Status: FULL   Family Communication: Daughter at bedside in ER.   Disposition Plan: Home at discharge   Consults called: None   Admission status: Obs   Time spent: 35 minutes  Sigmund Morera Marry Guan MD [facility] Pager [phone]  If 7PM-7AM, please contact night-coverage [email or web address] Password [email or web address]  05/02/2018, 12:22 AM

## 2018-05-03 LAB — COMPREHENSIVE METABOLIC PANEL
ALT: 29 U/L (ref 0–44)
ANION GAP: 15 (ref 5–15)
AST: 86 U/L — ABNORMAL HIGH (ref 15–41)
Albumin: 3.1 g/dL — ABNORMAL LOW (ref 3.5–5.0)
Alkaline Phosphatase: 102 U/L (ref 38–126)
BUN: <5 mg/dL — ABNORMAL LOW (ref 6–20)
CO2: 19 mmol/L — AB (ref 22–32)
CREATININE: 0.76 mg/dL (ref 0.61–1.24)
Calcium: 8.4 mg/dL — ABNORMAL LOW (ref 8.9–10.3)
Chloride: 97 mmol/L — ABNORMAL LOW (ref 98–111)
GFR calc non Af Amer: >60 mL/min (ref >60)
Glucose, Bld: 122 mg/dL — ABNORMAL HIGH (ref 70–99)
POTASSIUM: 3.3 mmol/L — AB (ref 3.5–5.1)
SODIUM: 131 mmol/L — AB (ref 135–145)
Total Bilirubin: 0.7 mg/dL (ref 0.3–1.2)
Total Protein: 6.8 g/dL (ref 6.5–8.1)

## 2018-05-03 LAB — LIPASE, BLOOD: LIPASE: 322 U/L — AB (ref 11–51)

## 2018-05-03 MED ORDER — ENOXAPARIN SODIUM 40 MG/0.4ML ~~LOC~~ SOLN: 40.0000 mg | SUBCUTANEOUS | Status: DC

## 2018-05-03 MED ORDER — POTASSIUM CHLORIDE CRYS ER 20 MEQ PO TBCR
40.0000 meq | EXTENDED_RELEASE_TABLET | Freq: Once | ORAL | Status: AC
  Administered 2018-05-03: 40 meq via ORAL
  Filled 2018-05-03: qty 2

## 2018-05-03 NOTE — Progress Notes (Signed)
Ultrasound called and stated patient needs to be npo, prior to ultrasound.  They will do ultrasound in the morning, and asked if patient could be placed npo after midnight.

## 2018-05-03 NOTE — Progress Notes (Signed)
Progress Note   Subjective  Patient states he is doing much better today. Reports pain improved to 2-3/10, much improved since last visit. No vomiting, he's tolerating clears and passing gas / stools. Reports some loose stool overnight. CT scan as below     Objective   Vital signs in last 24 hours: Temp:  [98.3 F (36.8 C)-98.9 F (37.2 C)] 98.3 F (36.8 C) (08/01 0541) Pulse Rate:  [77-106] 106 (08/01 0541) Resp:  [18-20] 18 (08/01 0541) BP: (127-153)/(74-91) 127/74 (08/01 0541) SpO2:  [97 %-100 %] 100 % (08/01 0541) Weight:  [117 lb 4.6 oz (53.2 kg)] 117 lb 4.6 oz (53.2 kg) (07/31 2000) Last BM Date: 05/01/18 General:    AA male in NAD Heart:  Regular rate and rhythm; no murmurs Lungs: Respirations even and unlabored, lungs CTA bilaterally Abdomen:  Soft, nontender, mildly distended.  Extremities:  Without edema. Neurologic:  Alert and oriented,  grossly normal neurologically. Psych:  Cooperative. Normal mood and affect.  Intake/Output from previous day: 07/31 0701 - 08/01 0700 In: 1772.8 [P.O.:720; I.V.:1002.8; IV Piggyback:50] Out: 280 [Urine:280] Intake/Output this shift: No intake/output data recorded.  Lab Results: Recent Labs    05/01/18 2155 05/02/18 0235  WBC 10.0 9.6  HGB 12.6* 11.4*  HCT 39.3 35.7*  PLT 308 275   BMET Recent Labs    05/01/18 2155 05/02/18 0235 05/03/18 0605  NA 139 140 131*  K 3.7 4.3 3.3*  CL 99 104 97*  CO2 10* 11* 19*  GLUCOSE 76 81 122*  BUN 8 7 <5*  CREATININE 0.95 1.04 0.76  CALCIUM 8.8* 8.3* 8.4*   LFT Recent Labs    05/03/18 0605  PROT 6.8  ALBUMIN 3.1*  AST 86*  ALT 29  ALKPHOS 102  BILITOT 0.7   PT/INR No results for input(s): LABPROT, INR in the last 72 hours.  Studies/Results: Ct Abdomen Pelvis W Contrast  Result Date: 05/02/2018 CLINICAL DATA:  52 y/o M; right-sided abdominal pain with history of alcohol use. Elevated lipase. History of colon cancer with colon resection. EXAM: CT ABDOMEN  AND PELVIS WITH CONTRAST TECHNIQUE: Multidetector CT imaging of the abdomen and pelvis was performed using the standard protocol following bolus administration of intravenous contrast. CONTRAST:  100 cc Isovue-300 COMPARISON:  03/25/2017 CT abdomen and pelvis. FINDINGS: Lower chest: Small focus of consolidation within the dependent left lower lobe. Hepatobiliary: Hepatomegaly. No focal liver lesion identified. Hepatic steatosis. Patent portal venous system. No intra or extrahepatic biliary ductal dilatation. No gallbladder wall thickening or radiopaque cholelithiasis. Pancreas: Extensive edema throughout the upper abdominal retroperitoneum greater on the left and surrounding the pancreas. No discrete rim enhancing collection or findings of pancreatic necrosis. Spleen: Normal in size without focal abnormality. Adrenals/Urinary Tract: Adrenal glands are unremarkable. Kidneys are normal, without renal calculi, focal lesion, or hydronephrosis. Bladder is unremarkable. Stomach/Bowel: Partial colectomy with patent anastomosis in the left lower quadrant. Mild diffuse low-attenuation wall thickening of the colon without obstruction. Right lower quadrant enteroenteric intussusception with mild proximal distention of the upstream small bowel and minimal passage of oral contrast beyond the point of intussusception. Vascular/Lymphatic: Aortic atherosclerosis. No enlarged abdominal or pelvic lymph nodes. Reproductive: Prostate is unremarkable. Other: No abdominal wall hernia or abnormality. No abdominopelvic ascites. Musculoskeletal: No fracture is seen. IMPRESSION: 1. Extensive upper abdominal retroperitoneal edema, likely representing acute pancreatitis. No acute peripancreatic collection or findings of necrosis at this time. 2. Diffuse wall thickening of the residual colon may represent acute colitis or  possibly portal hypertensive colopathy. 3. Right lower quadrant enteroenteric intussusception with mild upstream distention  of small bowel and minimal passage of oral contrast indicating partial obstruction. 4. Small focus of consolidation within the dependent left lower lobe, possibly aspiration given distribution. 5. Hepatomegaly and hepatic steatosis. Electronically Signed   By: Kristine Garbe M.D.   On: 05/02/2018 18:39   Dg Abd Acute W/chest  Result Date: 05/01/2018 CLINICAL DATA:  Mid abdominal pain and vomiting. EXAM: DG ABDOMEN ACUTE W/ 1V CHEST COMPARISON:  Radiographs and CT February 2019 FINDINGS: The cardiomediastinal contours are normal. The lungs are clear. There is no free intra-abdominal air. Generalized paucity of small bowel gas. Enteric sutures in the left abdomen with surgical clips. Air within normal caliber ascending and transverse colon. No radiopaque calculi. No acute osseous abnormalities are seen. IMPRESSION: 1. Clear lungs. 2. Non-specific paucity of small bowel gas. No gaseous dilatation to suggest obstruction. No free air. Electronically Signed   By: Jeb Levering M.D.   On: 05/01/2018 21:47       Assessment / Plan:   52 y/o male with chronic alcoholism, increased alcohol intake recently, presenting with a few weeks of progressive abdominal pain, found to have pancreatitis by labs and imaging.   Given IVF and bowel rest, overall appears significantly improved symptomatically this morning.  I discussed pancreatitis with him. This is very likely due to his alcohol intake, almost certainly, but will obtain an [place] to rule out gallstones to ensure okay. Would also check baseline lipid panel if none on file to ensure okay. He also has hepatic steatosis and at risk for cirrhosis with his longstanding drinking. We discussed the importance of alcohol cessation, he will work on this, monitoring with CIWA scale now / detox.   Of note, had suspected partial SBO on CT scan as well as colonic thickening. He is tolerating liquids and has no vomiting, and passing gas / stool, he is clinically not  obstructed at this time, but would keep on clear liquids today. Otherwise, colon thickening is nonspecific, if having diarrhea would send stools for C diff initially. Continue IVF and replete electrolytes.   Will follow, call with questions.  Mullen Cellar, MD [facility]

## 2018-05-03 NOTE — Progress Notes (Signed)
PROGRESS NOTE    Richard [name]  QZE:[id number] DOB: 1966/07/18 DOA: 05/01/2018 PCP: Patient, No Pcp Per   Brief Narrative: Patient is a 52 year old male with past medical history significant for chronic alcohol abuse, peptic ulcer disease , history of colon cancer status post resection who presented with abdominal pain patient was found to have elevated lipase level, metabolic acidosis due to alcohol.  Admitted for management of acute pancreatitis.  He has been admitted in the past with abdominal pain.  Assessment & Plan:   Active Problems:   Hepatic steatosis   Alcohol abuse   Alcoholic ketoacidosis   Pancreatitis   Protein-calorie malnutrition, severe  Acute pancreatitis: Mild elevation in lipase.  Mild elevated LFTs.  Abdominal pain much better today.Continue IV fluids.  On clear liquid diet GI following.  GI recommending ultrasound of the abdomen to rule out gallstones.  We will also check basic lipid panel. CT scan showed peripancreatic edema suggesting of acute pancreatitis.  No necrosis.  CT also showed colonic thickening, possible SBO.  But patient is having bowel movements and he does not complain of any abdominal pain so clinically he is not obstructed at this time.  We will continue clear liquid diet for today.  Chronic alcohol abuse: Drinks daily.  He usually drinks vodka.  He is interested on alcohol rehabilitation.  Social worker requested for providing the resources. We will continue to monitor for withdrawal.  History of colon cancer: Currently in remission.  Status post colon resection.  Needs GI follow-up as an outpatient as well  Metabolic acidosis/Alcoholic ketoacidosis: Improved.Continue IV fluids.  Alcohol level was elevated on presentation.   DVT prophylaxis:SCD Code Status: Full Family Communication: None present at the bedside Disposition Plan: Home in 1 to 2 days   Consultants: GI  Procedures: None  Antimicrobials: None  Subjective: Patient seen  and examined the bedside this morning.  Feels much better today.  No abdominal pain.  Diarrhea has stopped. Objective: Vitals:   05/02/18 2010 05/03/18 0158 05/03/18 0541 05/03/18 1417  BP: (!) 141/83 (!) 136/91 127/74 (!) 142/96  Pulse: 77 83 (!) 106 86  Resp: 20 18 18 17   Temp: 98.7 F (37.1 C) 98.9 F (37.2 C) 98.3 F (36.8 C) 98.4 F (36.9 C)  TempSrc: Oral Oral Oral Oral  SpO2: 97% 97% 100% 98%  Weight:      Height:        Intake/Output Summary (Last 24 hours) at 05/03/2018 1441 Last data filed at 05/03/2018 1006 Gross per 24 hour  Intake 1292.76 ml  Output 280 ml  Net 1012.76 ml   Filed Weights   05/01/18 2107 05/02/18 0200 05/02/18 2000  Weight: 65.8 kg (145 lb) 63.5 kg (140 lb) 53.2 kg (117 lb 4.6 oz)    Examination:  General exam: comfortable ,thin built HEENT:PERRL,Oral mucosa moist, Ear/Nose normal on gross exam Respiratory system: Bilateral equal air entry, normal vesicular breath sounds, no wheezes or crackles  Cardiovascular system: S1 & S2 heard, RRR. No JVD, murmurs, rubs, gallops or clicks. No pedal edema. Gastrointestinal system: Abdomen is nondistended, soft ,nontender.No organomegaly or masses felt. Normal bowel sounds heard. Central nervous system: Alert and oriented. No focal neurological deficits. Extremities: No edema, no clubbing ,no cyanosis, distal peripheral pulses palpable. Skin: No rashes, lesions or ulcers,no icterus ,no pallor MSK: Normal muscle bulk,tone ,power Psychiatry: Judgement and insight appear normal. Mood & affect appropriate.     Data Reviewed: I have personally reviewed following labs and imaging studies  CBC:  Recent Labs  Lab 05/01/18 2155 05/02/18 0235  WBC 10.0 9.6  NEUTROABS 8.5*  --   HGB 12.6* 11.4*  HCT 39.3 35.7*  MCV 97.5 96.5  PLT 308 229   Basic Metabolic Panel: Recent Labs  Lab 05/01/18 2155 05/02/18 0235 05/03/18 0605  NA 139 140 131*  K 3.7 4.3 3.3*  CL 99 104 97*  CO2 10* 11* 19*  GLUCOSE 76 81  122*  BUN 8 7 <5*  CREATININE 0.95 1.04 0.76  CALCIUM 8.8* 8.3* 8.4*   GFR: Estimated Creatinine Clearance: 83.1 mL/min (by C-G formula based on SCr of 0.76 mg/dL). Liver Function Tests: Recent Labs  Lab 05/01/18 2155 05/02/18 0235 05/03/18 0605  AST 70* 62* 86*  ALT 36 32 29  ALKPHOS 106 95 102  BILITOT 1.0 1.4* 0.7  PROT 8.0 7.5 6.8  ALBUMIN 3.7 3.4* 3.1*   Recent Labs  Lab 05/01/18 2155 05/02/18 0235 05/03/18 0605  LIPASE 158* 200* 322*   No results for input(s): AMMONIA in the last 168 hours. Coagulation Profile: No results for input(s): INR, PROTIME in the last 168 hours. Cardiac Enzymes: No results for input(s): CKTOTAL, CKMB, CKMBINDEX, TROPONINI in the last 168 hours. BNP (last 3 results) No results for input(s): PROBNP in the last 8760 hours. HbA1C: No results for input(s): HGBA1C in the last 72 hours. CBG: No results for input(s): GLUCAP in the last 168 hours. Lipid Profile: No results for input(s): CHOL, HDL, LDLCALC, TRIG, CHOLHDL, LDLDIRECT in the last 72 hours. Thyroid Function Tests: No results for input(s): TSH, T4TOTAL, FREET4, T3FREE, THYROIDAB in the last 72 hours. Anemia Panel: No results for input(s): VITAMINB12, FOLATE, FERRITIN, TIBC, IRON, RETICCTPCT in the last 72 hours. Sepsis Labs: Recent Labs  Lab 05/02/18 0235  LATICACIDVEN 1.8    No results found for this or any previous visit (from the past 240 hour(s)).       Radiology Studies: Ct Abdomen Pelvis W Contrast  Result Date: 05/02/2018 CLINICAL DATA:  52 y/o M; right-sided abdominal pain with history of alcohol use. Elevated lipase. History of colon cancer with colon resection. EXAM: CT ABDOMEN AND PELVIS WITH CONTRAST TECHNIQUE: Multidetector CT imaging of the abdomen and pelvis was performed using the standard protocol following bolus administration of intravenous contrast. CONTRAST:  100 cc Isovue-300 COMPARISON:  03/25/2017 CT abdomen and pelvis. FINDINGS: Lower chest: Small  focus of consolidation within the dependent left lower lobe. Hepatobiliary: Hepatomegaly. No focal liver lesion identified. Hepatic steatosis. Patent portal venous system. No intra or extrahepatic biliary ductal dilatation. No gallbladder wall thickening or radiopaque cholelithiasis. Pancreas: Extensive edema throughout the upper abdominal retroperitoneum greater on the left and surrounding the pancreas. No discrete rim enhancing collection or findings of pancreatic necrosis. Spleen: Normal in size without focal abnormality. Adrenals/Urinary Tract: Adrenal glands are unremarkable. Kidneys are normal, without renal calculi, focal lesion, or hydronephrosis. Bladder is unremarkable. Stomach/Bowel: Partial colectomy with patent anastomosis in the left lower quadrant. Mild diffuse low-attenuation wall thickening of the colon without obstruction. Right lower quadrant enteroenteric intussusception with mild proximal distention of the upstream small bowel and minimal passage of oral contrast beyond the point of intussusception. Vascular/Lymphatic: Aortic atherosclerosis. No enlarged abdominal or pelvic lymph nodes. Reproductive: Prostate is unremarkable. Other: No abdominal wall hernia or abnormality. No abdominopelvic ascites. Musculoskeletal: No fracture is seen. IMPRESSION: 1. Extensive upper abdominal retroperitoneal edema, likely representing acute pancreatitis. No acute peripancreatic collection or findings of necrosis at this time. 2. Diffuse wall thickening of the residual colon  may represent acute colitis or possibly portal hypertensive colopathy. 3. Right lower quadrant enteroenteric intussusception with mild upstream distention of small bowel and minimal passage of oral contrast indicating partial obstruction. 4. Small focus of consolidation within the dependent left lower lobe, possibly aspiration given distribution. 5. Hepatomegaly and hepatic steatosis. Electronically Signed   By: Kristine Garbe M.D.    On: 05/02/2018 18:39   Dg Abd Acute W/chest  Result Date: 05/01/2018 CLINICAL DATA:  Mid abdominal pain and vomiting. EXAM: DG ABDOMEN ACUTE W/ 1V CHEST COMPARISON:  Radiographs and CT February 2019 FINDINGS: The cardiomediastinal contours are normal. The lungs are clear. There is no free intra-abdominal air. Generalized paucity of small bowel gas. Enteric sutures in the left abdomen with surgical clips. Air within normal caliber ascending and transverse colon. No radiopaque calculi. No acute osseous abnormalities are seen. IMPRESSION: 1. Clear lungs. 2. Non-specific paucity of small bowel gas. No gaseous dilatation to suggest obstruction. No free air. Electronically Signed   By: Jeb Levering M.D.   On: 05/01/2018 21:47        Scheduled Meds: . enoxaparin (LOVENOX) injection  40 mg Subcutaneous Q24H  . feeding supplement  1 Container Oral TID BM  . folic acid  1 mg Oral Daily  . labetalol  100 mg Oral BID  . multivitamin with minerals  1 tablet Oral Daily  . nicotine  14 mg Transdermal Q24H  . thiamine  100 mg Oral Daily   Or  . thiamine  100 mg Intravenous Daily   Continuous Infusions: . sodium chloride 125 mL/hr at 05/03/18 1223  . famotidine (PEPCID) IV 20 mg (05/03/18 0902)     LOS: 1 day    Time spent: 25 mins.More than 50% of that time was spent in counseling and/or coordination of care.      Shelly Coss, MD [facility] Pager [phone]  If 7PM-7AM, please contact night-coverage [email or web address] Password [email or web address] 05/03/2018, 2:41 PM

## 2018-05-04 ENCOUNTER — Inpatient Hospital Stay (HOSPITAL_COMMUNITY): Payer: Self-pay

## 2018-05-04 LAB — LIPID PANEL
Cholesterol: 97 mg/dL (ref 0–200)
HDL: 35 mg/dL — ABNORMAL LOW
LDL Cholesterol: 49 mg/dL (ref 0–99)
Total CHOL/HDL Ratio: 2.8 ratio
Triglycerides: 63 mg/dL
VLDL: 13 mg/dL (ref 0–40)

## 2018-05-04 LAB — BASIC METABOLIC PANEL WITH GFR
Anion gap: 9 (ref 5–15)
BUN: <5 mg/dL — ABNORMAL LOW (ref 6–20)
CO2: 28 mmol/L (ref 22–32)
Calcium: 8.7 mg/dL — ABNORMAL LOW (ref 8.9–10.3)
Chloride: 102 mmol/L (ref 98–111)
Creatinine, Ser: 0.51 mg/dL — ABNORMAL LOW (ref 0.61–1.24)
GFR calc Af Amer: >60 mL/min
GFR calc non Af Amer: >60 mL/min
Glucose, Bld: 89 mg/dL (ref 70–99)
Potassium: 2.9 mmol/L — ABNORMAL LOW (ref 3.5–5.1)
Sodium: 139 mmol/L (ref 135–145)

## 2018-05-04 LAB — LIPASE, BLOOD: Lipase: 319 U/L — ABNORMAL HIGH (ref 11–51)

## 2018-05-04 LAB — MAGNESIUM: MAGNESIUM: 1.5 mg/dL — AB (ref 1.7–2.4)

## 2018-05-04 MED ORDER — THIAMINE HCL 100 MG PO TABS: 100.0000 mg | ORAL_TABLET | Freq: Every day | ORAL | 0 refills | Status: DC

## 2018-05-04 MED ORDER — METOPROLOL TARTRATE 5 MG/5ML IV SOLN: 5.0000 mg | INTRAVENOUS | Status: DC | PRN

## 2018-05-04 MED ORDER — POTASSIUM CHLORIDE 10 MEQ/100ML IV SOLN
10.0000 meq | INTRAVENOUS | Status: AC
  Administered 2018-05-04 (×4): 10 meq via INTRAVENOUS
  Filled 2018-05-04 (×4): qty 100

## 2018-05-04 MED ORDER — POTASSIUM CHLORIDE CRYS ER 20 MEQ PO TBCR
40.0000 meq | EXTENDED_RELEASE_TABLET | Freq: Once | ORAL | Status: AC
  Administered 2018-05-04: 40 meq via ORAL
  Filled 2018-05-04: qty 2

## 2018-05-04 MED ORDER — POTASSIUM CHLORIDE ER 20 MEQ PO TBCR: 20.0000 meq | EXTENDED_RELEASE_TABLET | Freq: Every day | ORAL | 0 refills | Status: DC

## 2018-05-04 NOTE — Progress Notes (Signed)
Progress Note   Subjective  Patient is doing very well. No abdominal pain at all. He wants to eat. Also seems motivated to quit alcohol. [place] not done yesterday, should be done today. NPO while waiting for that.   Objective   Vital signs in last 24 hours: Temp:  [98.1 F (36.7 C)-99 F (37.2 C)] 99 F (37.2 C) (08/02 0421) Pulse Rate:  [74-86] 76 (08/02 0421) Resp:  [17-19] 18 (08/02 0421) BP: (142-145)/(94-97) 145/94 (08/02 0421) SpO2:  [98 %-100 %] 100 % (08/02 0421) Last BM Date: 05/03/18(per pt) General:    AA male in NAD Heart:  Regular rate and rhythm; no murmurs Lungs: Respirations even and unlabored, lungs CTA bilaterally Abdomen:  Soft, nontender and nondistended.  Extremities:  Without edema. Neurologic:  Alert and oriented,  grossly normal neurologically. Psych:  Cooperative. Normal mood and affect.  Intake/Output from previous day: 08/01 0701 - 08/02 0700 In: 3397.9 [P.O.:720; I.V.:2527.9; IV Piggyback:150] Out: 850 [Urine:850] Intake/Output this shift: No intake/output data recorded.  Lab Results: Recent Labs    05/01/18 2155 05/02/18 0235  WBC 10.0 9.6  HGB 12.6* 11.4*  HCT 39.3 35.7*  PLT 308 275   BMET Recent Labs    05/02/18 0235 05/03/18 0605 05/04/18 0608  NA 140 131* 139  K 4.3 3.3* 2.9*  CL 104 97* 102  CO2 11* 19* 28  GLUCOSE 81 122* 89  BUN 7 <5* <5*  CREATININE 1.04 0.76 0.51*  CALCIUM 8.3* 8.4* 8.7*   LFT Recent Labs    05/03/18 0605  PROT 6.8  ALBUMIN 3.1*  AST 86*  ALT 29  ALKPHOS 102  BILITOT 0.7   PT/INR No results for input(s): LABPROT, INR in the last 72 hours.  Studies/Results: Ct Abdomen Pelvis W Contrast  Result Date: 05/02/2018 CLINICAL DATA:  52 y/o M; right-sided abdominal pain with history of alcohol use. Elevated lipase. History of colon cancer with colon resection. EXAM: CT ABDOMEN AND PELVIS WITH CONTRAST TECHNIQUE: Multidetector CT imaging of the abdomen and pelvis was performed using the  standard protocol following bolus administration of intravenous contrast. CONTRAST:  100 cc Isovue-300 COMPARISON:  03/25/2017 CT abdomen and pelvis. FINDINGS: Lower chest: Small focus of consolidation within the dependent left lower lobe. Hepatobiliary: Hepatomegaly. No focal liver lesion identified. Hepatic steatosis. Patent portal venous system. No intra or extrahepatic biliary ductal dilatation. No gallbladder wall thickening or radiopaque cholelithiasis. Pancreas: Extensive edema throughout the upper abdominal retroperitoneum greater on the left and surrounding the pancreas. No discrete rim enhancing collection or findings of pancreatic necrosis. Spleen: Normal in size without focal abnormality. Adrenals/Urinary Tract: Adrenal glands are unremarkable. Kidneys are normal, without renal calculi, focal lesion, or hydronephrosis. Bladder is unremarkable. Stomach/Bowel: Partial colectomy with patent anastomosis in the left lower quadrant. Mild diffuse low-attenuation wall thickening of the colon without obstruction. Right lower quadrant enteroenteric intussusception with mild proximal distention of the upstream small bowel and minimal passage of oral contrast beyond the point of intussusception. Vascular/Lymphatic: Aortic atherosclerosis. No enlarged abdominal or pelvic lymph nodes. Reproductive: Prostate is unremarkable. Other: No abdominal wall hernia or abnormality. No abdominopelvic ascites. Musculoskeletal: No fracture is seen. IMPRESSION: 1. Extensive upper abdominal retroperitoneal edema, likely representing acute pancreatitis. No acute peripancreatic collection or findings of necrosis at this time. 2. Diffuse wall thickening of the residual colon may represent acute colitis or possibly portal hypertensive colopathy. 3. Right lower quadrant enteroenteric intussusception with mild upstream distention of small bowel and minimal passage of  oral contrast indicating partial obstruction. 4. Small focus of  consolidation within the dependent left lower lobe, possibly aspiration given distribution. 5. Hepatomegaly and hepatic steatosis. Electronically Signed   By: Kristine Garbe M.D.   On: 05/02/2018 18:39       Assessment / Plan:   52 y/o male with chronic alcoholism, increased alcohol intake recently, presenting with a few weeks of progressive abdominal pain, found to have pancreatitis by labs and imaging.   Pancreatitis almost certainly due to alcohol, but US abdomen pending to assess for gallstones, although even if present his LAEs suggest this is likely due to alcohol. Lipids normal. He's done really well so far, no abdominal pain today and wants to eat. We have discussed the importance of alcohol cessation in light of this occurrence and steatosis noted on [place] with risks for cirrhosis. He is motivated to quit long term.   Recommend: - [place] today - low fat diet once done with [place], as tolerated - if he tolerates a diet, could be discharged later today  - repletion of K - complete alcohol abstinence - he will coordinate a follow up in our office in the upcoming weeks post discharge. He thinks he had his last colonoscopy around 2014-2015. Will discuss scheduling surveillance colonoscopy at his office follow up given his history of colon cancer and CT findings  Call with questions.  Yolo Cellar, MD [facility]

## 2018-05-04 NOTE — Care Management Note (Signed)
Case Management Note  Patient Details  Name: KEALAN BUCHAN MRN: [id number] Date of Birth: 17-Jun-1966  Subjective/Objective:  CM referral for pcp-no health insurance-provided patient w/pcp listing-[facility] encouraged-patient voiced understanding.                  Action/Plan:d/c home.   Expected Discharge Date:  05/04/18               Expected Discharge Plan:  Home/Self Care  In-House Referral:     Discharge planning Services  CM Consult  Post Acute Care Choice:    Choice offered to:     DME Arranged:    DME Agency:     HH Arranged:    HH Agency:     Status of Service:  Completed, signed off  If discussed at [employer] of Stay Meetings, dates discussed:    Additional Comments:  Dessa Phi, RN 05/04/2018, 3:20 PM

## 2018-05-04 NOTE — Progress Notes (Signed)
Went over d/c instructions with patient.  He verbalized understanding.  Left hospital with AVS via personal vehicle.

## 2018-05-04 NOTE — Discharge Summary (Signed)
Physician Discharge Summary  Richard [name] ACZ:[id number] DOB: 19-Mar-1966 DOA: 05/01/2018  PCP: Patient, No Pcp Per  Admit date: 05/01/2018 Discharge date: 05/04/2018  Admitted From: Home Disposition:  Home  Discharge Condition:Stable CODE STATUS:FULL Diet recommendation: Heart Healthy,Low fat diet  Brief/Interim Summary: Patient is a 52 year old male with past medical history significant for chronic alcohol abuse, peptic ulcer disease , history of colon cancer status post resection who presented with abdominal pain patient was found to have elevated lipase level, metabolic acidosis due to alcohol.  Admitted for management of acute pancreatitis.  He has been admitted in the past with abdominal pain.  Following problems were addressed during his hospitalization:  Acute pancreatitis: Mild elevation in lipase.  Mild elevated LFTs.  Abdominal pain much better today.Diet advanced. GI following.    Ultrasound of the abdomen did not show any gallbladder stones .lipid panel normal.  CT scan showed peripancreatic edema suggesting of acute pancreatitis.  No necrosis.  CT also showed colonic thickening, possible SBO.  But patient is having bowel movements and he does not complain of any abdominal pain so clinically he is not obstructed at this time.   Patient is stable for discharge to home today.  He will follow-up with GI as an outpatient.  Chronic alcohol abuse: Drinks daily.  He usually drinks vodka.  He is interested on alcohol rehabilitation.  Social worker requested for providing the resources. Patient encouraged to follow-up with alcohol rehabilitation services as an outpatient.  Continue thiamine and folic acid at home.  History of colon cancer: Currently in remission.  Status post colon resection.  Needs GI follow-up as an outpatient as well  Metabolic acidosis/Alcoholic ketoacidosis: Improved.Alcohol level was elevated on presentation.      Discharge Diagnoses:  Active Problems:    Hepatic steatosis   Alcohol abuse   Alcoholic ketoacidosis   Pancreatitis   Protein-calorie malnutrition, severe    Discharge Instructions  Discharge Instructions    Diet - low sodium heart healthy   Complete by:  As directed    Low fat diet   Discharge instructions   Complete by:  As directed    1) Please stop alcohol consumption. 2) Follow up with alcohol rehabilitation services. 3) Follow up with gastroenterology as an outpatient.  Name and number of the provider has been attached. 4) Take prescribed medications as instructed.   Increase activity slowly   Complete by:  As directed      Allergies as of 05/04/2018      Reactions   Aspirin Other (See Comments)   Acid reflux    Penicillins Hives   Has patient had a PCN reaction causing immediate rash, facial/tongue/throat swelling, SOB or lightheadedness with hypotension: yes Has patient had a PCN reaction causing severe rash involving mucus membranes or skin necrosis: no Has patient had a PCN reaction that required hospitalization: no Has patient had a PCN reaction occurring within the last 10 years: no If all of the above answers are "NO", then may proceed with Cephalosporin use.      Medication List    STOP taking these medications   cephALEXin 500 MG capsule Commonly known as:  KEFLEX     TAKE these medications   folic acid 1 MG tablet Commonly known as:  FOLVITE Take 1 tablet (1 mg total) by mouth daily.   labetalol 100 MG tablet Commonly known as:  NORMODYNE Take 1 tablet (100 mg total) by mouth 2 (two) times daily.   ondansetron 8 MG disintegrating  tablet Commonly known as:  ZOFRAN ODT Take 1 tablet (8 mg total) by mouth every 8 (eight) hours as needed for nausea or vomiting.   Potassium Chloride ER 20 MEQ Tbcr Take 20 mEq by mouth daily for 5 days. Start taking on:  05/05/2018   thiamine 100 MG tablet Take 1 tablet (100 mg total) by mouth daily. Start taking on:  05/05/2018      Follow-up Information     Armbruster, Carlota Raspberry, MD. Schedule an appointment as soon as possible for a visit in 2 week(s).   Specialty:  Gastroenterology Contact information: [address] [place] [address] [phone]          Allergies  Allergen Reactions  . Aspirin Other (See Comments)    Acid reflux   . Penicillins Hives    Has patient had a PCN reaction causing immediate rash, facial/tongue/throat swelling, SOB or lightheadedness with hypotension: yes Has patient had a PCN reaction causing severe rash involving mucus membranes or skin necrosis: no Has patient had a PCN reaction that required hospitalization: no Has patient had a PCN reaction occurring within the last 10 years: no If all of the above answers are "NO", then may proceed with Cephalosporin use.     Consultations: GI  Procedures/Studies: Ct Abdomen Pelvis W Contrast  Result Date: 05/02/2018 CLINICAL DATA:  52 y/o M; right-sided abdominal pain with history of alcohol use. Elevated lipase. History of colon cancer with colon resection. EXAM: CT ABDOMEN AND PELVIS WITH CONTRAST TECHNIQUE: Multidetector CT imaging of the abdomen and pelvis was performed using the standard protocol following bolus administration of intravenous contrast. CONTRAST:  100 cc Isovue-300 COMPARISON:  03/25/2017 CT abdomen and pelvis. FINDINGS: Lower chest: Small focus of consolidation within the dependent left lower lobe. Hepatobiliary: Hepatomegaly. No focal liver lesion identified. Hepatic steatosis. Patent portal venous system. No intra or extrahepatic biliary ductal dilatation. No gallbladder wall thickening or radiopaque cholelithiasis. Pancreas: Extensive edema throughout the upper abdominal retroperitoneum greater on the left and surrounding the pancreas. No discrete rim enhancing collection or findings of pancreatic necrosis. Spleen: Normal in size without focal abnormality. Adrenals/Urinary Tract: Adrenal glands are unremarkable. Kidneys are  normal, without renal calculi, focal lesion, or hydronephrosis. Bladder is unremarkable. Stomach/Bowel: Partial colectomy with patent anastomosis in the left lower quadrant. Mild diffuse low-attenuation wall thickening of the colon without obstruction. Right lower quadrant enteroenteric intussusception with mild proximal distention of the upstream small bowel and minimal passage of oral contrast beyond the point of intussusception. Vascular/Lymphatic: Aortic atherosclerosis. No enlarged abdominal or pelvic lymph nodes. Reproductive: Prostate is unremarkable. Other: No abdominal wall hernia or abnormality. No abdominopelvic ascites. Musculoskeletal: No fracture is seen. IMPRESSION: 1. Extensive upper abdominal retroperitoneal edema, likely representing acute pancreatitis. No acute peripancreatic collection or findings of necrosis at this time. 2. Diffuse wall thickening of the residual colon may represent acute colitis or possibly portal hypertensive colopathy. 3. Right lower quadrant enteroenteric intussusception with mild upstream distention of small bowel and minimal passage of oral contrast indicating partial obstruction. 4. Small focus of consolidation within the dependent left lower lobe, possibly aspiration given distribution. 5. Hepatomegaly and hepatic steatosis. Electronically Signed   By: Kristine Garbe M.D.   On: 05/02/2018 18:39   Dg Abd Acute W/chest  Result Date: 05/01/2018 CLINICAL DATA:  Mid abdominal pain and vomiting. EXAM: DG ABDOMEN ACUTE W/ 1V CHEST COMPARISON:  Radiographs and CT February 2019 FINDINGS: The cardiomediastinal contours are normal. The lungs are clear. There  is no free intra-abdominal air. Generalized paucity of small bowel gas. Enteric sutures in the left abdomen with surgical clips. Air within normal caliber ascending and transverse colon. No radiopaque calculi. No acute osseous abnormalities are seen. IMPRESSION: 1. Clear lungs. 2. Non-specific paucity of small  bowel gas. No gaseous dilatation to suggest obstruction. No free air. Electronically Signed   By: Jeb Levering M.D.   On: 05/01/2018 21:47   US Abdomen Limited Ruq  Result Date: 05/04/2018 CLINICAL DATA:  Pancreatitis. Alcohol induced acute pancreatitis without infection or necrosis. EXAM: ULTRASOUND ABDOMEN LIMITED RIGHT UPPER QUADRANT COMPARISON:  CT abdomen and pelvis 05/02/2018. FINDINGS: Gallbladder: No gallstones or wall thickening visualized. No sonographic Murphy sign noted by sonographer. Pericholecystic fluid is secondary to adjacent inflammatory changes of the pancreas. Common bile duct: Diameter: 4.8 mm, within normal limits. Liver: No focal lesion identified. Within normal limits in parenchymal echogenicity. Portal vein is patent on color Doppler imaging with normal direction of blood flow towards the liver. IMPRESSION: 1. Normal sonographic appearance of the gallbladder and liver. 2. Ascites, related to known pancreatitis. 3. No secondary biliary duct obstruction. Electronically Signed   By: San Morelle M.D.   On: 05/04/2018 11:38      Subjective:  Patient seen and examined the bedside this morning.  Remains comfortable.  No new issues/events.  Stable for discharge Discharge Exam: Vitals:   05/03/18 2112 05/04/18 0421  BP: (!) 145/97 (!) 145/94  Pulse: 74 76  Resp: 19 18  Temp: 98.1 F (36.7 C) 99 F (37.2 C)  SpO2: 98% 100%   Vitals:   05/03/18 0541 05/03/18 1417 05/03/18 2112 05/04/18 0421  BP: 127/74 (!) 142/96 (!) 145/97 (!) 145/94  Pulse: (!) 106 86 74 76  Resp: 18 17 19 18   Temp: 98.3 F (36.8 C) 98.4 F (36.9 C) 98.1 F (36.7 C) 99 F (37.2 C)  TempSrc: Oral Oral Oral Oral  SpO2: 100% 98% 98% 100%  Weight:      Height:        General: Pt is alert, awake, not in acute distress Cardiovascular: RRR, S1/S2 +, no rubs, no gallops Respiratory: CTA bilaterally, no wheezing, no rhonchi Abdominal: Soft, NT, ND, bowel sounds + Extremities: no edema,  no cyanosis    The results of significant diagnostics from this hospitalization (including imaging, microbiology, ancillary and laboratory) are listed below for reference.     Microbiology: No results found for this or any previous visit (from the past 240 hour(s)).   Labs: BNP (last 3 results) No results for input(s): BNP in the last 8760 hours. Basic Metabolic Panel: Recent Labs  Lab 05/01/18 2155 05/02/18 0235 05/03/18 0605 05/04/18 0608  NA 139 140 131* 139  K 3.7 4.3 3.3* 2.9*  CL 99 104 97* 102  CO2 10* 11* 19* 28  GLUCOSE 76 81 122* 89  BUN 8 7 <5* <5*  CREATININE 0.95 1.04 0.76 0.51*  CALCIUM 8.8* 8.3* 8.4* 8.7*  MG  --   --   --  1.5*   Liver Function Tests: Recent Labs  Lab 05/01/18 2155 05/02/18 0235 05/03/18 0605  AST 70* 62* 86*  ALT 36 32 29  ALKPHOS 106 95 102  BILITOT 1.0 1.4* 0.7  PROT 8.0 7.5 6.8  ALBUMIN 3.7 3.4* 3.1*   Recent Labs  Lab 05/01/18 2155 05/02/18 0235 05/03/18 0605 05/04/18 0608  LIPASE 158* 200* 322* 319*   No results for input(s): AMMONIA in the last 168 hours. CBC: Recent Labs  Lab 05/01/18 2155 05/02/18 0235  WBC 10.0 9.6  NEUTROABS 8.5*  --   HGB 12.6* 11.4*  HCT 39.3 35.7*  MCV 97.5 96.5  PLT 308 275   Cardiac Enzymes: No results for input(s): CKTOTAL, CKMB, CKMBINDEX, TROPONINI in the last 168 hours. BNP: Invalid input(s): POCBNP CBG: No results for input(s): GLUCAP in the last 168 hours. D-Dimer No results for input(s): DDIMER in the last 72 hours. Hgb A1c No results for input(s): HGBA1C in the last 72 hours. Lipid Profile Recent Labs    05/04/18 0608  CHOL 97  HDL 35*  LDLCALC 49  TRIG 63  CHOLHDL 2.8   Thyroid function studies No results for input(s): TSH, T4TOTAL, T3FREE, THYROIDAB in the last 72 hours.  Invalid input(s): FREET3 Anemia work up No results for input(s): VITAMINB12, FOLATE, FERRITIN, TIBC, IRON, RETICCTPCT in the last 72 hours. Urinalysis    Component Value Date/Time    COLORURINE YELLOW 05/01/2018 2114   APPEARANCEUR CLEAR 05/01/2018 2114   LABSPEC 1.014 05/01/2018 2114   PHURINE 5.0 05/01/2018 2114   GLUCOSEU NEGATIVE 05/01/2018 2114   HGBUR SMALL (A) 05/01/2018 2114   BILIRUBINUR NEGATIVE 05/01/2018 2114   KETONESUR 80 (A) 05/01/2018 2114   PROTEINUR 100 (A) 05/01/2018 2114   UROBILINOGEN 0.2 01/13/2009 1011   NITRITE NEGATIVE 05/01/2018 2114   LEUKOCYTESUR NEGATIVE 05/01/2018 2114   Sepsis Labs Invalid input(s): PROCALCITONIN,  WBC,  LACTICIDVEN Microbiology No results found for this or any previous visit (from the past 240 hour(s)).  Please note: You were cared for by a hospitalist during your hospital stay. Once you are discharged, your primary care physician will handle any further medical issues. Please note that NO REFILLS for any discharge medications will be authorized once you are discharged, as it is imperative that you return to your primary care physician (or establish a relationship with a primary care physician if you do not have one) for your post hospital discharge needs so that they can reassess your need for medications and monitor your lab values.    Time coordinating discharge: 40 minutes  SIGNED:   Shelly Coss, MD  [facility] 05/04/2018, 1:01 PM Pager [phone]  If 7PM-7AM, please contact night-coverage [email or web address] Password [email or web address]

## 2018-05-07 ENCOUNTER — Telehealth: Payer: Self-pay

## 2018-05-07 NOTE — Telephone Encounter (Signed)
-----   Message from Yetta Flock, MD sent at 05/04/2018  1:24 PM EDT ----- Regarding: perry follow up Vaughan Basta this is a former patient of Dr. Henrene Pastor who will need a routine office follow up in a few months. Thanks

## 2018-05-07 NOTE — Telephone Encounter (Signed)
Pt scheduled to see Dr. Henrene Pastor 07/10/18@10am . Appt letter mailed to pt.

## 2018-06-27 ENCOUNTER — Other Ambulatory Visit: Payer: Self-pay

## 2018-06-27 ENCOUNTER — Encounter (HOSPITAL_COMMUNITY): Payer: Self-pay

## 2018-06-27 ENCOUNTER — Emergency Department (HOSPITAL_COMMUNITY): Payer: Self-pay

## 2018-06-27 ENCOUNTER — Observation Stay (HOSPITAL_COMMUNITY)
Admission: EM | Admit: 2018-06-27 | Discharge: 2018-06-28 | Discharge status: Against Medical Advice | Payer: Self-pay | Attending: Family Medicine | Admitting: Family Medicine

## 2018-06-27 DIAGNOSIS — K219 Gastro-esophageal reflux disease without esophagitis: Secondary | ICD-10-CM | POA: Insufficient documentation

## 2018-06-27 DIAGNOSIS — Y905 Blood alcohol level of 100-119 mg/100 ml: Secondary | ICD-10-CM | POA: Insufficient documentation

## 2018-06-27 DIAGNOSIS — R112 Nausea with vomiting, unspecified: Secondary | ICD-10-CM | POA: Diagnosis present

## 2018-06-27 DIAGNOSIS — D696 Thrombocytopenia, unspecified: Secondary | ICD-10-CM | POA: Insufficient documentation

## 2018-06-27 DIAGNOSIS — R778 Other specified abnormalities of plasma proteins: Secondary | ICD-10-CM

## 2018-06-27 DIAGNOSIS — R1013 Epigastric pain: Secondary | ICD-10-CM

## 2018-06-27 DIAGNOSIS — R7989 Other specified abnormal findings of blood chemistry: Secondary | ICD-10-CM

## 2018-06-27 DIAGNOSIS — Z8711 Personal history of peptic ulcer disease: Secondary | ICD-10-CM | POA: Insufficient documentation

## 2018-06-27 DIAGNOSIS — E162 Hypoglycemia, unspecified: Secondary | ICD-10-CM

## 2018-06-27 DIAGNOSIS — K860 Alcohol-induced chronic pancreatitis: Secondary | ICD-10-CM | POA: Insufficient documentation

## 2018-06-27 DIAGNOSIS — E860 Dehydration: Secondary | ICD-10-CM | POA: Insufficient documentation

## 2018-06-27 DIAGNOSIS — I160 Hypertensive urgency: Secondary | ICD-10-CM

## 2018-06-27 DIAGNOSIS — D649 Anemia, unspecified: Secondary | ICD-10-CM | POA: Insufficient documentation

## 2018-06-27 DIAGNOSIS — E872 Acidosis: Secondary | ICD-10-CM | POA: Insufficient documentation

## 2018-06-27 DIAGNOSIS — K2920 Alcoholic gastritis without bleeding: Principal | ICD-10-CM | POA: Insufficient documentation

## 2018-06-27 DIAGNOSIS — Z85028 Personal history of other malignant neoplasm of stomach: Secondary | ICD-10-CM | POA: Insufficient documentation

## 2018-06-27 DIAGNOSIS — E8729 Other acidosis: Secondary | ICD-10-CM | POA: Diagnosis present

## 2018-06-27 DIAGNOSIS — Z886 Allergy status to analgesic agent status: Secondary | ICD-10-CM | POA: Insufficient documentation

## 2018-06-27 DIAGNOSIS — Z880 Allergy status to penicillin: Secondary | ICD-10-CM | POA: Insufficient documentation

## 2018-06-27 DIAGNOSIS — I10 Essential (primary) hypertension: Secondary | ICD-10-CM | POA: Insufficient documentation

## 2018-06-27 DIAGNOSIS — F17210 Nicotine dependence, cigarettes, uncomplicated: Secondary | ICD-10-CM | POA: Insufficient documentation

## 2018-06-27 DIAGNOSIS — Z79899 Other long term (current) drug therapy: Secondary | ICD-10-CM | POA: Insufficient documentation

## 2018-06-27 DIAGNOSIS — I700 Atherosclerosis of aorta: Secondary | ICD-10-CM | POA: Insufficient documentation

## 2018-06-27 DIAGNOSIS — R109 Unspecified abdominal pain: Secondary | ICD-10-CM | POA: Diagnosis present

## 2018-06-27 DIAGNOSIS — K760 Fatty (change of) liver, not elsewhere classified: Secondary | ICD-10-CM | POA: Insufficient documentation

## 2018-06-27 DIAGNOSIS — F1010 Alcohol abuse, uncomplicated: Secondary | ICD-10-CM

## 2018-06-27 DIAGNOSIS — Z85038 Personal history of other malignant neoplasm of large intestine: Secondary | ICD-10-CM | POA: Insufficient documentation

## 2018-06-27 DIAGNOSIS — R748 Abnormal levels of other serum enzymes: Secondary | ICD-10-CM

## 2018-06-27 HISTORY — DX: Other specified abnormal findings of blood chemistry: R79.89

## 2018-06-27 HISTORY — DX: Other specified abnormalities of plasma proteins: R77.8

## 2018-06-27 HISTORY — DX: Hypertensive urgency: I16.0

## 2018-06-27 HISTORY — DX: Hypoglycemia, unspecified: E16.2

## 2018-06-27 LAB — CBG MONITORING, ED
GLUCOSE-CAPILLARY: 71 mg/dL (ref 70–99)
Glucose-Capillary: 81 mg/dL (ref 70–99)
Glucose-Capillary: 53 mg/dL — ABNORMAL LOW (ref 70–99)
Glucose-Capillary: 104 mg/dL — ABNORMAL HIGH (ref 70–99)

## 2018-06-27 LAB — COMPREHENSIVE METABOLIC PANEL
ALBUMIN: 3.4 g/dL — AB (ref 3.5–5.0)
ALT: 21 U/L (ref 0–44)
AST: 49 U/L — AB (ref 15–41)
Alkaline Phosphatase: 99 U/L (ref 38–126)
Anion gap: 20 — ABNORMAL HIGH (ref 5–15)
BUN: 5 mg/dL — AB (ref 6–20)
CHLORIDE: 104 mmol/L (ref 98–111)
CO2: 13 mmol/L — ABNORMAL LOW (ref 22–32)
Calcium: 8.5 mg/dL — ABNORMAL LOW (ref 8.9–10.3)
Creatinine, Ser: 0.99 mg/dL (ref 0.61–1.24)
GFR calc Af Amer: >60 mL/min (ref >60)
Glucose, Bld: 80 mg/dL (ref 70–99)
POTASSIUM: 3.5 mmol/L (ref 3.5–5.1)
Sodium: 137 mmol/L (ref 135–145)
Total Bilirubin: 1.1 mg/dL (ref 0.3–1.2)
Total Protein: 7.6 g/dL (ref 6.5–8.1)

## 2018-06-27 LAB — URINALYSIS, ROUTINE W REFLEX MICROSCOPIC
Bilirubin Urine: NEGATIVE
GLUCOSE, UA: NEGATIVE mg/dL
KETONES UR: 80 mg/dL — AB
LEUKOCYTES UA: NEGATIVE
NITRITE: NEGATIVE
PH: 6 (ref 5.0–8.0)
Protein, ur: 30 mg/dL — AB
Specific Gravity, Urine: 1.014 (ref 1.005–1.030)

## 2018-06-27 LAB — CBC WITH DIFFERENTIAL/PLATELET
ABS IMMATURE GRANULOCYTES: 0 10*3/uL (ref 0.0–0.1)
BASOS ABS: 0.1 10*3/uL (ref 0.0–0.1)
Basophils Relative: 1 %
Eosinophils Absolute: 0.1 10*3/uL (ref 0.0–0.7)
Eosinophils Relative: 1 %
HCT: 39.6 % (ref 39.0–52.0)
HEMOGLOBIN: 12.5 g/dL — AB (ref 13.0–17.0)
IMMATURE GRANULOCYTES: 0 %
Lymphocytes Relative: 11 %
Lymphs Abs: 1 10*3/uL (ref 0.7–4.0)
MCH: 31.2 pg (ref 26.0–34.0)
MCHC: 31.6 g/dL (ref 30.0–36.0)
MCV: 98.8 fL (ref 78.0–100.0)
MONO ABS: 0.6 10*3/uL (ref 0.1–1.0)
Monocytes Relative: 7 %
NEUTROS ABS: 7.7 10*3/uL (ref 1.7–7.7)
NEUTROS PCT: 80 %
Platelets: 187 10*3/uL (ref 150–400)
RBC: 4.01 MIL/uL — ABNORMAL LOW (ref 4.22–5.81)
RDW: 16.3 % — ABNORMAL HIGH (ref 11.5–15.5)
WBC: 9.6 10*3/uL (ref 4.0–10.5)

## 2018-06-27 LAB — ETHANOL: Alcohol, Ethyl (B): 113 mg/dL — ABNORMAL HIGH (ref <10)

## 2018-06-27 LAB — TROPONIN I: TROPONIN I: 0.05 ng/mL — AB (ref <0.03)

## 2018-06-27 LAB — LIPASE, BLOOD: LIPASE: 48 U/L (ref 11–51)

## 2018-06-27 MED ORDER — LORAZEPAM 1 MG PO TABS: 0.0000 mg | ORAL_TABLET | Freq: Four times a day (QID) | ORAL | Status: DC

## 2018-06-27 MED ORDER — GI COCKTAIL ~~LOC~~
30.0000 mL | Freq: Once | ORAL | Status: AC
  Administered 2018-06-27: 30 mL via ORAL
  Filled 2018-06-27: qty 30

## 2018-06-27 MED ORDER — THIAMINE HCL 100 MG/ML IJ SOLN: 100.0000 mg | Freq: Once | INTRAMUSCULAR | Status: DC

## 2018-06-27 MED ORDER — SODIUM CHLORIDE 0.9 % IV BOLUS
1000.0000 mL | Freq: Once | INTRAVENOUS | Status: AC
  Administered 2018-06-27: 1000 mL via INTRAVENOUS

## 2018-06-27 MED ORDER — FOLIC ACID 1 MG PO TABS: 1.0000 mg | ORAL_TABLET | Freq: Every day | ORAL | Status: DC

## 2018-06-27 MED ORDER — ADULT MULTIVITAMIN W/MINERALS CH: 1.0000 | ORAL_TABLET | Freq: Every day | ORAL | Status: DC

## 2018-06-27 MED ORDER — ONDANSETRON HCL 4 MG/2ML IJ SOLN
4.0000 mg | Freq: Four times a day (QID) | INTRAMUSCULAR | Status: DC | PRN
  Administered 2018-06-28: 4 mg via INTRAVENOUS
  Filled 2018-06-27: qty 2

## 2018-06-27 MED ORDER — LORAZEPAM 2 MG/ML IJ SOLN: 0.0000 mg | Freq: Four times a day (QID) | INTRAMUSCULAR | Status: DC

## 2018-06-27 MED ORDER — LORAZEPAM 2 MG/ML IJ SOLN: 0.0000 mg | Freq: Two times a day (BID) | INTRAMUSCULAR | Status: DC

## 2018-06-27 MED ORDER — ONDANSETRON HCL 4 MG PO TABS: 4.0000 mg | ORAL_TABLET | Freq: Four times a day (QID) | ORAL | Status: DC | PRN

## 2018-06-27 MED ORDER — DEXTROSE 10 % IV SOLN
INTRAVENOUS | Status: DC
  Administered 2018-06-27: 22:00:00 via INTRAVENOUS

## 2018-06-27 MED ORDER — IOHEXOL 300 MG/ML  SOLN
100.0000 mL | Freq: Once | INTRAMUSCULAR | Status: AC | PRN
  Administered 2018-06-27: 100 mL via INTRAVENOUS

## 2018-06-27 MED ORDER — FAMOTIDINE IN NACL 20-0.9 MG/50ML-% IV SOLN
20.0000 mg | Freq: Two times a day (BID) | INTRAVENOUS | Status: DC
  Administered 2018-06-27: 20 mg via INTRAVENOUS
  Filled 2018-06-27: qty 50

## 2018-06-27 MED ORDER — VITAMIN B-1 100 MG PO TABS: 100.0000 mg | ORAL_TABLET | Freq: Every day | ORAL | Status: DC

## 2018-06-27 MED ORDER — SODIUM CHLORIDE 0.9 % IV BOLUS
500.0000 mL | Freq: Once | INTRAVENOUS | Status: AC
  Administered 2018-06-27: 500 mL via INTRAVENOUS

## 2018-06-27 MED ORDER — LORAZEPAM 2 MG/ML IJ SOLN: 1.0000 mg | Freq: Four times a day (QID) | INTRAMUSCULAR | Status: DC | PRN

## 2018-06-27 MED ORDER — THIAMINE HCL 100 MG/ML IJ SOLN: 100.0000 mg | Freq: Every day | INTRAMUSCULAR | Status: DC

## 2018-06-27 MED ORDER — DEXTROSE 10 % IV BOLUS
250.0000 mL | Freq: Once | INTRAVENOUS | Status: AC
  Administered 2018-06-27: 250 mL via INTRAVENOUS

## 2018-06-27 MED ORDER — LORAZEPAM 1 MG PO TABS: 1.0000 mg | ORAL_TABLET | Freq: Four times a day (QID) | ORAL | Status: DC | PRN

## 2018-06-27 MED ORDER — ACETAMINOPHEN 325 MG PO TABS: 650.0000 mg | ORAL_TABLET | Freq: Once | ORAL | Status: DC

## 2018-06-27 MED ORDER — LABETALOL HCL 5 MG/ML IV SOLN
5.0000 mg | INTRAVENOUS | Status: DC | PRN
  Administered 2018-06-28: 5 mg via INTRAVENOUS
  Filled 2018-06-27: qty 4

## 2018-06-27 MED ORDER — LORAZEPAM 1 MG PO TABS: 0.0000 mg | ORAL_TABLET | Freq: Two times a day (BID) | ORAL | Status: DC

## 2018-06-27 NOTE — ED Notes (Signed)
Pt CBG 104. Notified Roselyn Reef, Therapist, sports.

## 2018-06-27 NOTE — H&P (Signed)
History and Physical    DUTCH ING BSJ:[id number] DOB: 10-Jan-1966 DOA: 06/27/2018  PCP: Patient, No Pcp Per  Patient coming from: Home.  Chief Complaint: Abdominal pain low blood sugar.  HPI: Richard [name] is a 52 y.o. male with history of colon cancer status post surgery in remission with history of chronic alcohol abuse and chronic pancreatitis who was admitted last month for acute pancreatitis presents to the ER because of worsening abdominal pain with nausea vomiting and diarrhea also was found to have low blood sugar.  As per the patient family was at the bedside patient has been a worsening pain in the abdomen last 3 days with persistent nausea vomiting no diarrhea.  The vomitus did not have any blood.  Abdominal pain is generalized mostly in the periumbilical area.  Has not been able to eat anything but was able to drink alcohol.  Patient has become more weak and EMS was called.  Patient was found to be hypoglycemic and was brought to the ER.  ED Course: In the ER patient remained hypoglycemic in the 49s and patient was given thiamine IV followed by which patient was started on D10 since patient remained hypoglycemic despite D50.  CT of the abdomen pelvis does not show any bowel obstruction but does show some bowel dysmotility at the enterocolonic anastomotic site and features for gastritis.  Labs are largely unremarkable.  Urine does show some ketosis.  Patient admitted for hypoglycemia and abdominal pain likely from gastritis.  Review of Systems: As per HPI, rest all negative.   Past Medical History:  Diagnosis Date  . Acid reflux   . Colon cancer ([place])   . Colon cancer ([place])   . PUD (peptic ulcer disease)     Past Surgical History:  Procedure Laterality Date  . COLON RESECTION       reports that he has been smoking. He has been smoking about 1.00 pack per day. He has never used smokeless tobacco. He reports that he drinks alcohol. He reports that he does not use  drugs.  Allergies  Allergen Reactions  . Aspirin Other (See Comments)    Acid reflux   . Penicillins Hives    Has patient had a PCN reaction causing immediate rash, facial/tongue/throat swelling, SOB or lightheadedness with hypotension: yes Has patient had a PCN reaction causing severe rash involving mucus membranes or skin necrosis: no Has patient had a PCN reaction that required hospitalization: no Has patient had a PCN reaction occurring within the last 10 years: no If all of the above answers are "NO", then may proceed with Cephalosporin use.     Family History  Problem Relation Age of Onset  . Colon cancer Father   . Cancer Sister   . CAD Neg Hx   . Stroke Neg Hx   . Diabetes Neg Hx     Prior to Admission medications   Medication Sig Start Date End Date Taking? Authorizing Provider  Esomeprazole Magnesium (NEXIUM PO) Take 1 capsule by mouth daily.   Yes [provider]  folic acid (FOLVITE) 1 MG tablet Take 1 tablet (1 mg total) by mouth daily. Patient not taking: Reported on 11/13/2017 08/27/16   [name], MD  labetalol (NORMODYNE) 100 MG tablet Take 1 tablet (100 mg total) by mouth 2 (two) times daily. Patient not taking: Reported on 11/13/2017 08/27/16   [name], MD  ondansetron (ZOFRAN ODT) 8 MG disintegrating tablet Take 1 tablet (8 mg total) by  mouth every 8 (eight) hours as needed for nausea or vomiting. Patient not taking: Reported on 06/27/2018 03/25/17   Jola Schmidt, MD  potassium chloride 20 MEQ TBCR Take 20 mEq by mouth daily for 5 days. 05/05/18 05/10/18  Shelly Coss, MD  thiamine 100 MG tablet Take 1 tablet (100 mg total) by mouth daily. Patient not taking: Reported on 06/27/2018 05/05/18   Shelly Coss, MD    Physical Exam: Vitals:   06/27/18 1718 06/27/18 1723 06/27/18 1827 06/27/18 2124  BP:    (!) 186/101  Pulse:    66  Resp:    12  Temp:   98.7 F (37.1 C) 98.2 F (36.8 C)  TempSrc:   Oral Oral  SpO2: 100%   100%  Weight:   62.6 kg    Height:  6' (1.829 m)        Constitutional: Moderately built and nourished. Vitals:   06/27/18 1718 06/27/18 1723 06/27/18 1827 06/27/18 2124  BP:    (!) 186/101  Pulse:    66  Resp:    12  Temp:   98.7 F (37.1 C) 98.2 F (36.8 C)  TempSrc:   Oral Oral  SpO2: 100%   100%  Weight:  62.6 kg    Height:  6' (1.829 m)     Eyes: Anicteric no pallor. ENMT: No discharge from the ears eyes nose or mouth. Neck: No mass felt.  No neck rigidity.  No JVD appreciated. Respiratory: No rhonchi or crepitations. Cardiovascular: S1-S2 heard no murmurs appreciated. Abdomen: Mild tenderness in the epigastric area. Musculoskeletal: No edema. Skin: No rash. Neurologic: Alert awake oriented to time place and person.  Moves all extremities. Psychiatric: Appears normal per normal affect.   Labs on Admission: I have personally reviewed following labs and imaging studies  CBC: Recent Labs  Lab 06/27/18 1807  WBC 9.6  NEUTROABS 7.7  HGB 12.5*  HCT 39.6  MCV 98.8  PLT 144   Basic Metabolic Panel: Recent Labs  Lab 06/27/18 1807  NA 137  K 3.5  CL 104  CO2 13*  GLUCOSE 80  BUN 5*  CREATININE 0.99  CALCIUM 8.5*   GFR: Estimated Creatinine Clearance: 79 mL/min (by C-G formula based on SCr of 0.99 mg/dL). Liver Function Tests: Recent Labs  Lab 06/27/18 1807  AST 49*  ALT 21  ALKPHOS 99  BILITOT 1.1  PROT 7.6  ALBUMIN 3.4*   Recent Labs  Lab 06/27/18 1807  LIPASE 48   No results for input(s): AMMONIA in the last 168 hours. Coagulation Profile: No results for input(s): INR, PROTIME in the last 168 hours. Cardiac Enzymes: Recent Labs  Lab 06/27/18 1807  TROPONINI 0.05*   BNP (last 3 results) No results for input(s): PROBNP in the last 8760 hours. HbA1C: No results for input(s): HGBA1C in the last 72 hours. CBG: Recent Labs  Lab 06/27/18 1832 06/27/18 1946 06/27/18 2120  GLUCAP 81 71 53*   Lipid Profile: No results for input(s): CHOL, HDL,  LDLCALC, TRIG, CHOLHDL, LDLDIRECT in the last 72 hours. Thyroid Function Tests: No results for input(s): TSH, T4TOTAL, FREET4, T3FREE, THYROIDAB in the last 72 hours. Anemia Panel: No results for input(s): VITAMINB12, FOLATE, FERRITIN, TIBC, IRON, RETICCTPCT in the last 72 hours. Urine analysis:    Component Value Date/Time   COLORURINE YELLOW 06/27/2018 1934   APPEARANCEUR CLEAR 06/27/2018 1934   LABSPEC 1.014 06/27/2018 1934   PHURINE 6.0 06/27/2018 1934   GLUCOSEU NEGATIVE 06/27/2018 1934   HGBUR SMALL (  A) 06/27/2018 1934   BILIRUBINUR NEGATIVE 06/27/2018 1934   KETONESUR 80 (A) 06/27/2018 1934   PROTEINUR 30 (A) 06/27/2018 1934   UROBILINOGEN 0.2 01/13/2009 1011   NITRITE NEGATIVE 06/27/2018 1934   LEUKOCYTESUR NEGATIVE 06/27/2018 1934   Sepsis Labs: @LABRCNTIP (procalcitonin:4,lacticidven:4) )No results found for this or any previous visit (from the past 240 hour(s)).   Radiological Exams on Admission: Dg Chest 2 View  Result Date: 06/27/2018 CLINICAL DATA:  Weakness, fatigue EXAM: CHEST - 2 VIEW COMPARISON:  05/01/2018 FINDINGS: Heart and mediastinal contours are within normal limits. No focal opacities or effusions. No acute bony abnormality. Mild peribronchial thickening IMPRESSION: Mild bronchitic changes. Electronically Signed   By: Rolm Baptise M.D.   On: 06/27/2018 19:28   Ct Abdomen Pelvis W Contrast  Result Date: 06/27/2018 CLINICAL DATA:  Stomach pain, history of stomach cancer. Nausea and vomiting with acute weakness. EXAM: CT ABDOMEN AND PELVIS WITH CONTRAST TECHNIQUE: Multidetector CT imaging of the abdomen and pelvis was performed using the standard protocol following bolus administration of intravenous contrast. CONTRAST:  164mL OMNIPAQUE IOHEXOL 300 MG/ML  SOLN COMPARISON:  05/02/2018 CT FINDINGS: Lower chest: Normal heart size without pericardial effusion. Lung bases are clear. Hepatobiliary: Steatosis of the liver with stable loud hepatomegaly. No enhancing liver  lesions. No biliary dilatation. The gallbladder is distended likely from a fasting state. No mural thickening or gallstones. Patent portal veins. Pancreas: Normal Spleen: Normal Adrenals/Urinary Tract: Adrenal glands are unremarkable. Kidneys are normal, without renal calculi, focal lesion, or hydronephrosis. Bladder is unremarkable. Stomach/Bowel: Diffuse mild-to-moderate gastric fold thickening suspicious for changes of gastritis. No ulcer, focal mural or mucosal thickening nor mass. Normal small bowel rotation. Partial colectomy with patent anastomosis in the left lower quadrant. Probable component of bowel dysmotility given fecalized material within the lumen of the small and large bowel at the anastomotic site and also given the patulous appearance. No bowel obstruction. Slight swirling of the mesentery in the left hemiabdomen without closed-loop obstruction identified. Resolution of previously noted small bowel intussusception within the right hemiabdomen. Gas and stool noted within large bowel. Vascular/Lymphatic: Aortic atherosclerosis. No enlarged abdominal or pelvic lymph nodes. Reproductive: Prostate is unremarkable. Other: No abdominal wall hernia or abnormality. No abdominopelvic ascites. Musculoskeletal: No acute or significant osseous findings. IMPRESSION: 1. Thickened gastric mucosa query gastritis. 2. Patulous distended appearance at site of enterocolic anastomosis with fecalized material within. Suspect a component of bowel dysmotility from prior surgery. No definite mechanical bowel obstruction. Resolution of previously noted small bowel intussusception from right hemiabdomen. 3. Stable hepatomegaly with steatosis of the liver. Distended gallbladder likely from a fasting state. Electronically Signed   By: Ashley Royalty M.D.   On: 06/27/2018 21:06   Dg Foot Complete Right  Result Date: 06/27/2018 CLINICAL DATA:  Right foot pain EXAM: RIGHT FOOT COMPLETE - 3+ VIEW COMPARISON:  06/01/2015 FINDINGS:  Old healed metatarsal fractures. No acute fracture, subluxation or dislocation. Mild pes planus. Small plantar calcaneal spur. Mild hallux valgus. IMPRESSION: No acute bony abnormality.  Mild hallux valgus and pes planus. Electronically Signed   By: Rolm Baptise M.D.   On: 06/27/2018 19:30    EKG: Independently reviewed.  Normal sinus rhythm with nonspecific ST-T changes.  Assessment/Plan Principal Problem:   Abdominal pain Active Problems:   Intractable nausea and vomiting   Alcohol abuse   Alcoholic ketoacidosis   Hypertensive urgency   Elevated troponin    1. Abdominal pain with intractable nausea vomiting likely from alcoholic gastritis -we will keep patient  on Pepcid and clear liquid diet for now.  Antiemetics.  Pain relief medications.  Will get KUB in the morning.  CT scan does show some bowel dysmotility but no obstruction.  If pain persists may consult GI or surgery. 2. Hypoglycemia likely from poor oral intake.  Continue D10 patient is already on thiamine IV.  Closely follow CBGs.  Will check C-peptide beta hydroxybutyric acid and cortisol level to work-up for hypoglycemia. 3. Elevated troponin -patient denies any chest pain.  EKG just showed nonspecific changes.  Will trend cardiac markers. 4. Alcoholic ketoacidosis -advised about quitting alcohol drinking.  For now patient is gently hydrated and full liquid diet. 5. Elevated blood pressure has been placed on PRN IV labetalol. 6. Alcohol abuse on CIWA protocol. 7. History of chronic pancreatitis secondary to alcohol abuse. 8. Chronic anemia follow CBC.   DVT prophylaxis: SCDs. Code Status: Full code. Family Communication: Family at the bedside. Disposition Plan: Home. Consults called: None. Admission status: Observation.   [name] MD [facility] Pager [phone].  If 7PM-7AM, please contact night-coverage [email or web address] Password [email or web address]  06/27/2018, 10:50 PM

## 2018-06-27 NOTE — ED Notes (Signed)
Brought pt to bathroom and tried getting a urine sample however pt had already urinated.

## 2018-06-27 NOTE — ED Triage Notes (Signed)
Pt arrived via [facility] from home with c/o stomach pain and hx of stomach caner. Pt reports he has been cancer free since 2007 with last colonoscopy in 2014 which "was clear". C/O increasing weakness stating he has not eaten in about 2-3 days and only been able to ambulate from bathroom to living room.

## 2018-06-27 NOTE — ED Notes (Signed)
Pt CBG 81

## 2018-06-27 NOTE — ED Notes (Signed)
Pt temp. 98.3 

## 2018-06-27 NOTE — ED Notes (Signed)
Pt also c/o right foot pain states "it was broken in the past, had a cast, but it never got better".

## 2018-06-27 NOTE — ED Provider Notes (Signed)
[facility] EMERGENCY DEPARTMENT Provider Note   CSN: [id number] Arrival date & time: 06/27/18  1714     History   Chief Complaint Chief Complaint  Patient presents with  . Abdominal Pain  . Fatigue    HPI Richard [name] is a 52 y.o. male with past medical history of colon cancer in remission since 2007, alcohol abuse with alcoholic ketosis, pancreatitis, who presents today for evaluation of abdominal pain and generally not feeling well.  He was reportedly found to be hypoglycemic with EMS with a sugar of 45, he was given oral glucose and it came up to 107.  He reports that for the past 2 to 3 days he has been weak and generally not feeling well.  He has been vomiting and unable to keep down other p.o. intake.  He denies diarrhea or constipation.  He reports mid lower abdominal pain, says the last time he felt like this was last time he was admitted at [facility] long when he was admitted with alcohol induced pancreatitis, high anion gap acidosis.   He also reports right foot pain,  Thinks he re broke it but is unsure when.  Denies recent falls or trauma. History is limited as patient is a poor historian.   HPI  Past Medical History:  Diagnosis Date  . Acid reflux   . Colon cancer ([place])   . Colon cancer ([place])   . PUD (peptic ulcer disease)     Patient Active Problem List   Diagnosis Date Noted  . Abdominal pain 06/27/2018  . Hypertensive urgency 06/27/2018  . Elevated troponin 06/27/2018  . Hypoglycemia 06/27/2018  . Protein-calorie malnutrition, severe 05/02/2018  . Pancreatitis 05/01/2018  . Occult blood positive stool 11/13/2017  . Alcoholic ketoacidosis 22/11/5425  . Hepatic steatosis 08/21/2016  . Thrombocytopenia ([place]) 08/21/2016  . Alcohol abuse 08/21/2016  . Alcohol withdrawal ([place]) 08/20/2016  . Dehydration 08/20/2016  . Intractable nausea and vomiting 08/20/2016  . Lactic acidosis 08/20/2016  . Aspiration pneumonia ([place]) 08/20/2016  . Sepsis  ([place]) 08/20/2016    Past Surgical History:  Procedure Laterality Date  . COLON RESECTION          Home Medications    Prior to Admission medications   Medication Sig Start Date End Date Taking? Authorizing Provider  Esomeprazole Magnesium (NEXIUM PO) Take 1 capsule by mouth daily.   Yes [provider]  folic acid (FOLVITE) 1 MG tablet Take 1 tablet (1 mg total) by mouth daily. Patient not taking: Reported on 11/13/2017 08/27/16   [name], MD  labetalol (NORMODYNE) 100 MG tablet Take 1 tablet (100 mg total) by mouth 2 (two) times daily. Patient not taking: Reported on 11/13/2017 08/27/16   [name], MD  ondansetron (ZOFRAN ODT) 8 MG disintegrating tablet Take 1 tablet (8 mg total) by mouth every 8 (eight) hours as needed for nausea or vomiting. Patient not taking: Reported on 06/27/2018 03/25/17   Jola Schmidt, MD  potassium chloride 20 MEQ TBCR Take 20 mEq by mouth daily for 5 days. 05/05/18 05/10/18  Shelly Coss, MD  thiamine 100 MG tablet Take 1 tablet (100 mg total) by mouth daily. Patient not taking: Reported on 06/27/2018 05/05/18   Shelly Coss, MD    Family History Family History  Problem Relation Age of Onset  . Colon cancer Father   . Cancer Sister   . CAD Neg Hx   . Stroke Neg Hx   . Diabetes Neg Hx  Social History Social History   Tobacco Use  . Smoking status: Current Every Day Smoker    Packs/day: 1.00  . Smokeless tobacco: Never Used  Substance Use Topics  . Alcohol use: Yes    Comment: heavy alcohol abuse a beer and couple of shots a day for past  14 years  . Drug use: No     Allergies   Aspirin and Penicillins   Review of Systems Review of Systems  Constitutional: Negative for chills and fever.  HENT: Negative for ear pain and sore throat.   Eyes: Negative for pain and visual disturbance.  Respiratory: Negative for cough and shortness of breath.   Cardiovascular: Negative for chest pain and palpitations.   Gastrointestinal: Positive for abdominal pain, nausea and vomiting. Negative for diarrhea.  Genitourinary: Negative for dysuria and hematuria.  Musculoskeletal: Negative for arthralgias and back pain.  Skin: Negative for color change and rash.  Neurological: Positive for weakness. Negative for seizures and syncope.  All other systems reviewed and are negative.    Physical Exam Updated Vital Signs BP (!) 189/95 (BP Location: Right Arm)   Pulse 71   Temp 98.3 F (36.8 C) (Oral)   Resp 14   Ht 6' (1.829 m)   Wt 62.6 kg   SpO2 99%   BMI 18.72 kg/m   Physical Exam  Constitutional: He is oriented to person, place, and time.  Appears malnourished, chronically ill.    HENT:  Head: Normocephalic and atraumatic.  Right Ear: External ear normal.  Left Ear: External ear normal.  Mouth/Throat: Mucous membranes are dry.  Eyes: Conjunctivae are normal.  Neck: Neck supple.  Cardiovascular: Normal rate, regular rhythm and intact distal pulses.  No murmur heard. Right foot 2+ distal pulses.   Pulmonary/Chest: Effort normal and breath sounds normal. No respiratory distress.  Abdominal: Soft. Normal appearance and bowel sounds are normal. There is generalized tenderness and tenderness in the right upper quadrant, epigastric area and periumbilical area. There is no rigidity, no rebound and no guarding.  Musculoskeletal: He exhibits no edema.  No obvious deformity, abnormal erythema or crepitis over right foot.   Neurological: He is alert and oriented to person, place, and time.  Skin: Skin is warm and dry.  Psychiatric: He has a normal mood and affect.  Nursing note and vitals reviewed.    ED Treatments / Results  Labs (all labs ordered are listed, but only abnormal results are displayed) Labs Reviewed  COMPREHENSIVE METABOLIC PANEL - Abnormal; Notable for the following components:      Result Value   CO2 13 (*)    BUN 5 (*)    Calcium 8.5 (*)    Albumin 3.4 (*)    AST 49 (*)     Anion gap 20 (*)    All other components within normal limits  CBC WITH DIFFERENTIAL/PLATELET - Abnormal; Notable for the following components:   RBC 4.01 (*)    Hemoglobin 12.5 (*)    RDW 16.3 (*)    All other components within normal limits  URINALYSIS, ROUTINE W REFLEX MICROSCOPIC - Abnormal; Notable for the following components:   Hgb urine dipstick SMALL (*)    Ketones, ur 80 (*)    Protein, ur 30 (*)    Bacteria, UA RARE (*)    All other components within normal limits  ETHANOL - Abnormal; Notable for the following components:   Alcohol, Ethyl (B) 113 (*)    All other components within normal limits  TROPONIN  I - Abnormal; Notable for the following components:   Troponin I 0.05 (*)    All other components within normal limits  BETA-HYDROXYBUTYRIC ACID - Abnormal; Notable for the following components:   Beta-Hydroxybutyric Acid 3.02 (*)    All other components within normal limits  CBG MONITORING, ED - Abnormal; Notable for the following components:   Glucose-Capillary 53 (*)    All other components within normal limits  CBG MONITORING, ED - Abnormal; Notable for the following components:   Glucose-Capillary 104 (*)    All other components within normal limits  LIPASE, BLOOD  TROPONIN I  CORTISOL  BASIC METABOLIC PANEL  CBC  HEPATIC FUNCTION PANEL  TROPONIN I  TROPONIN I  C-PEPTIDE  RAPID URINE DRUG SCREEN, HOSP PERFORMED  CBG MONITORING, ED  CBG MONITORING, ED  CBG MONITORING, ED    EKG EKG Interpretation  Date/Time:  Wednesday June 27 2018 20:23:30 EDT Ventricular Rate:  71 PR Interval:  152 QRS Duration: 88 QT Interval:  456 QTC Calculation: 495 R Axis:   72 Text Interpretation:  Normal sinus rhythm Minimal voltage criteria for LVH, may be normal variant Prolonged QT Abnormal ECG When compared with ECG of EARLIER SAME DATE No significant change was found Confirmed by Delora Fuel ([id number]) on 06/27/2018 10:54:38 PM   Radiology Dg Chest 2  View  Result Date: 06/27/2018 CLINICAL DATA:  Weakness, fatigue EXAM: CHEST - 2 VIEW COMPARISON:  05/01/2018 FINDINGS: Heart and mediastinal contours are within normal limits. No focal opacities or effusions. No acute bony abnormality. Mild peribronchial thickening IMPRESSION: Mild bronchitic changes. Electronically Signed   By: Rolm Baptise M.D.   On: 06/27/2018 19:28   Ct Abdomen Pelvis W Contrast  Result Date: 06/27/2018 CLINICAL DATA:  Stomach pain, history of stomach cancer. Nausea and vomiting with acute weakness. EXAM: CT ABDOMEN AND PELVIS WITH CONTRAST TECHNIQUE: Multidetector CT imaging of the abdomen and pelvis was performed using the standard protocol following bolus administration of intravenous contrast. CONTRAST:  134mL OMNIPAQUE IOHEXOL 300 MG/ML  SOLN COMPARISON:  05/02/2018 CT FINDINGS: Lower chest: Normal heart size without pericardial effusion. Lung bases are clear. Hepatobiliary: Steatosis of the liver with stable loud hepatomegaly. No enhancing liver lesions. No biliary dilatation. The gallbladder is distended likely from a fasting state. No mural thickening or gallstones. Patent portal veins. Pancreas: Normal Spleen: Normal Adrenals/Urinary Tract: Adrenal glands are unremarkable. Kidneys are normal, without renal calculi, focal lesion, or hydronephrosis. Bladder is unremarkable. Stomach/Bowel: Diffuse mild-to-moderate gastric fold thickening suspicious for changes of gastritis. No ulcer, focal mural or mucosal thickening nor mass. Normal small bowel rotation. Partial colectomy with patent anastomosis in the left lower quadrant. Probable component of bowel dysmotility given fecalized material within the lumen of the small and large bowel at the anastomotic site and also given the patulous appearance. No bowel obstruction. Slight swirling of the mesentery in the left hemiabdomen without closed-loop obstruction identified. Resolution of previously noted small bowel intussusception within  the right hemiabdomen. Gas and stool noted within large bowel. Vascular/Lymphatic: Aortic atherosclerosis. No enlarged abdominal or pelvic lymph nodes. Reproductive: Prostate is unremarkable. Other: No abdominal wall hernia or abnormality. No abdominopelvic ascites. Musculoskeletal: No acute or significant osseous findings. IMPRESSION: 1. Thickened gastric mucosa query gastritis. 2. Patulous distended appearance at site of enterocolic anastomosis with fecalized material within. Suspect a component of bowel dysmotility from prior surgery. No definite mechanical bowel obstruction. Resolution of previously noted small bowel intussusception from right hemiabdomen. 3. Stable hepatomegaly with steatosis of the  liver. Distended gallbladder likely from a fasting state. Electronically Signed   By: Ashley Royalty M.D.   On: 06/27/2018 21:06   Dg Foot Complete Right  Result Date: 06/27/2018 CLINICAL DATA:  Right foot pain EXAM: RIGHT FOOT COMPLETE - 3+ VIEW COMPARISON:  06/01/2015 FINDINGS: Old healed metatarsal fractures. No acute fracture, subluxation or dislocation. Mild pes planus. Small plantar calcaneal spur. Mild hallux valgus. IMPRESSION: No acute bony abnormality.  Mild hallux valgus and pes planus. Electronically Signed   By: Rolm Baptise M.D.   On: 06/27/2018 19:30    Procedures Procedures (including critical care time)  Medications Ordered in ED Medications  dextrose 10 % infusion ( Intravenous New Bag/Given 06/27/18 2158)  LORazepam (ATIVAN) tablet 1 mg (has no administration in time range)    Or  LORazepam (ATIVAN) injection 1 mg (has no administration in time range)  thiamine (VITAMIN B-1) tablet 100 mg (has no administration in time range)    Or  thiamine (B-1) injection 100 mg (has no administration in time range)  folic acid (FOLVITE) tablet 1 mg (has no administration in time range)  multivitamin with minerals tablet 1 tablet (has no administration in time range)  ondansetron (ZOFRAN) tablet  4 mg (has no administration in time range)    Or  ondansetron (ZOFRAN) injection 4 mg (has no administration in time range)  LORazepam (ATIVAN) injection 0-4 mg (0 mg Intravenous Not Given 06/28/18 0000)    Followed by  LORazepam (ATIVAN) injection 0-4 mg (has no administration in time range)  labetalol (NORMODYNE,TRANDATE) injection 5 mg (5 mg Intravenous Given 06/28/18 0031)  famotidine (PEPCID) IVPB 20 mg premix (0 mg Intravenous Stopped 06/28/18 0013)  sodium chloride 0.9 % bolus 500 mL (0 mLs Intravenous Stopped 06/27/18 1849)  iohexol (OMNIPAQUE) 300 MG/ML solution 100 mL (100 mLs Intravenous Contrast Given 06/27/18 2038)  sodium chloride 0.9 % bolus 1,000 mL (0 mLs Intravenous Stopped 06/27/18 2236)  gi cocktail (Maalox,Lidocaine,Donnatal) (30 mLs Oral Given 06/27/18 2132)  dextrose (D10W) 10% bolus 250 mL (0 mLs Intravenous Stopped 06/27/18 2217)     Initial Impression / Assessment and Plan / ED Course  I have reviewed the triage vital signs and the nursing notes.  Pertinent labs & imaging results that were available during my care of the patient were reviewed by me and considered in my medical decision making (see chart for details).  Clinical Course as of Jun 29 135  Wed Jun 27, 2018  2000 Troponin I(!!): 0.05 [EH]  2133 D10 ordered  Glucose-Capillary(!): 32 [EH]  2235 Asked RN to give thiamine.  Spoke with dr. Lara Mulch who will admit patient.    [EH]    Clinical Course User Index [EH] Lorin Glass, PA-C   Patient presents today for evaluation of abdominal pain, fatigue, and generally not feeling well.  Upon arrival of EMS he was found to be hypoglycemic with a sugar of 40.  He was given oral glucose which elevated his sugar to 170.  He has a long-standing history of alcohol abuse with alcoholic ketosis and high anion gap acidosis.  While in the emergency room he had a second episode of hypoglycemia where his sugar dropped to 53, D10 bolus and drip was ordered.  His  lipase is not elevated, CMP appears mostly consistent with his baseline, however CO2 is decreased at 13 consistent with acidosis.  His CBC does not show any leukocytosis, hemoglobin mildly decreased at 12.5.  His urine has 80 ketones with rare bacteria.  Alcohol is only elevated at 113.  Patient was placed on CIWA protocol.  Initial troponin I and lab was obtained at 0.05 which is mildly elevated.  Suspect that this may be secondary to dehydration and demand.  Patient was rehydrated with IV fluids.  CT abdomen pelvis was obtained concern for gastritis.  Patient was mildly hypertensive while in the emergency room.  His pain was treated with GI cocktail.  He was given IV thiamine while in the ER given his history of alcoholism.  Chest x-ray did not show any acute abnormalities.  X-ray of his right foot did not show acute abnormalities, suspect that this is chronic pain.  I spoke with hospitalist Dr. Lara Mulch who will admit the patient.    Final Clinical Impressions(s) / ED Diagnoses   Final diagnoses:  Dehydration  Epigastric pain  Chronic alcoholic gastritis, presence of bleeding unspecified  Hypoglycemia    ED Discharge Orders    None       Ollen Gross 06/28/18 0140    Julianne Rice, MD 07/01/18 1159

## 2018-06-28 ENCOUNTER — Observation Stay (HOSPITAL_COMMUNITY): Payer: Self-pay

## 2018-06-28 DIAGNOSIS — K2920 Alcoholic gastritis without bleeding: Principal | ICD-10-CM

## 2018-06-28 DIAGNOSIS — E872 Acidosis: Secondary | ICD-10-CM

## 2018-06-28 LAB — HEPATIC FUNCTION PANEL
ALT: 22 U/L (ref 0–44)
AST: 48 U/L — ABNORMAL HIGH (ref 15–41)
Albumin: 3.2 g/dL — ABNORMAL LOW (ref 3.5–5.0)
Alkaline Phosphatase: 87 U/L (ref 38–126)
BILIRUBIN DIRECT: 0.5 mg/dL — AB (ref 0.0–0.2)
Indirect Bilirubin: 1.3 mg/dL — ABNORMAL HIGH (ref 0.3–0.9)
Total Bilirubin: 1.8 mg/dL — ABNORMAL HIGH (ref 0.3–1.2)
Total Protein: 7 g/dL (ref 6.5–8.1)

## 2018-06-28 LAB — BASIC METABOLIC PANEL
Anion gap: 12 (ref 5–15)
BUN: <5 mg/dL — ABNORMAL LOW (ref 6–20)
CHLORIDE: 100 mmol/L (ref 98–111)
CO2: 19 mmol/L — ABNORMAL LOW (ref 22–32)
CREATININE: 0.86 mg/dL (ref 0.61–1.24)
Calcium: 8.4 mg/dL — ABNORMAL LOW (ref 8.9–10.3)
GFR calc Af Amer: >60 mL/min (ref >60)
GFR calc non Af Amer: >60 mL/min (ref >60)
Glucose, Bld: 109 mg/dL — ABNORMAL HIGH (ref 70–99)
POTASSIUM: 3.2 mmol/L — AB (ref 3.5–5.1)
SODIUM: 131 mmol/L — AB (ref 135–145)

## 2018-06-28 LAB — CBG MONITORING, ED
GLUCOSE-CAPILLARY: 130 mg/dL — AB (ref 70–99)
GLUCOSE-CAPILLARY: 91 mg/dL (ref 70–99)
GLUCOSE-CAPILLARY: 124 mg/dL — AB (ref 70–99)
GLUCOSE-CAPILLARY: 132 mg/dL — AB (ref 70–99)
Glucose-Capillary: 104 mg/dL — ABNORMAL HIGH (ref 70–99)
Glucose-Capillary: 135 mg/dL — ABNORMAL HIGH (ref 70–99)
Glucose-Capillary: 94 mg/dL (ref 70–99)
Glucose-Capillary: 95 mg/dL (ref 70–99)

## 2018-06-28 LAB — RAPID URINE DRUG SCREEN, HOSP PERFORMED
Amphetamines: NOT DETECTED
BARBITURATES: NOT DETECTED
BENZODIAZEPINES: NOT DETECTED
COCAINE: NOT DETECTED
Opiates: NOT DETECTED
Tetrahydrocannabinol: POSITIVE — AB

## 2018-06-28 LAB — CBC
HCT: 35.1 % — ABNORMAL LOW (ref 39.0–52.0)
Hemoglobin: 11.6 g/dL — ABNORMAL LOW (ref 13.0–17.0)
MCH: 31.3 pg (ref 26.0–34.0)
MCHC: 33 g/dL (ref 30.0–36.0)
MCV: 94.6 fL (ref 78.0–100.0)
PLATELETS: 160 10*3/uL (ref 150–400)
RBC: 3.71 MIL/uL — AB (ref 4.22–5.81)
RDW: 15.5 % (ref 11.5–15.5)
WBC: 7 10*3/uL (ref 4.0–10.5)

## 2018-06-28 LAB — CORTISOL: Cortisol, Plasma: 12.5 ug/dL

## 2018-06-28 LAB — TROPONIN I
Troponin I: <0.03 ng/mL (ref <0.03)
Troponin I: <0.03 ng/mL (ref <0.03)

## 2018-06-28 LAB — BETA-HYDROXYBUTYRIC ACID: BETA-HYDROXYBUTYRIC ACID: 3.02 mmol/L — AB (ref 0.05–0.27)

## 2018-06-28 MED ORDER — THIAMINE HCL 100 MG PO TABS: 100.0000 mg | ORAL_TABLET | Freq: Every day | ORAL | 0 refills | Status: DC

## 2018-06-28 MED ORDER — DIPHENHYDRAMINE HCL 25 MG PO CAPS
25.0000 mg | ORAL_CAPSULE | Freq: Once | ORAL | Status: AC
  Administered 2018-06-28: 25 mg via ORAL
  Filled 2018-06-28: qty 1

## 2018-06-28 MED ORDER — DIPHENHYDRAMINE HCL 50 MG/ML IJ SOLN
25.0000 mg | Freq: Once | INTRAMUSCULAR | Status: AC
  Administered 2018-06-28: 25 mg via INTRAVENOUS
  Filled 2018-06-28: qty 1

## 2018-06-28 MED ORDER — ONDANSETRON 8 MG PO TBDP: 8.0000 mg | ORAL_TABLET | Freq: Three times a day (TID) | ORAL | 0 refills | Status: DC | PRN

## 2018-06-28 MED ORDER — FENTANYL CITRATE (PF) 100 MCG/2ML IJ SOLN
25.0000 ug | INTRAMUSCULAR | Status: DC | PRN
  Administered 2018-06-28 (×2): 25 ug via INTRAVENOUS
  Filled 2018-06-28 (×2): qty 2

## 2018-06-28 MED ORDER — FOLIC ACID 1 MG PO TABS: 1.0000 mg | ORAL_TABLET | Freq: Every day | ORAL | 0 refills | Status: DC

## 2018-06-28 MED ORDER — AMLODIPINE BESYLATE 10 MG PO TABS: 10.0000 mg | ORAL_TABLET | Freq: Every day | ORAL | 0 refills | Status: DC

## 2018-06-28 MED ORDER — FAMOTIDINE 20 MG PO TABS: 20.0000 mg | ORAL_TABLET | Freq: Two times a day (BID) | ORAL | 0 refills | Status: DC

## 2018-06-28 NOTE — ED Notes (Signed)
Patient ambulated to bathroom without assistance. Steady gait noted.

## 2018-06-28 NOTE — ED Notes (Signed)
Admitting paged about patient BP 181/92; patient was concerned about asked MD to be notified.

## 2018-06-28 NOTE — ED Notes (Signed)
Pt stated that he does not feel sick.

## 2018-06-28 NOTE — Discharge Summary (Signed)
Physician Discharge Summary  Richard [name]  OZY:[id number]  DOB: 1966-06-14  DOA: 06/27/2018 PCP: Patient, No Pcp Per  Admit date: 06/27/2018 Discharge date: 06/28/2018  Admitted From: Home  Disposition:  Home   Recommendations for Outpatient Follow-up:  1. Follow up with PCP in 1 week 2. Please obtain BMP/CBC in one week to monitor electrolytes and  hemoglobin  Discharge Condition: Stable CODE STATUS: Full code Diet recommendation: Heart Healthy   Brief/Interim Summary: For full details see H&P/Progress note, but in brief, Richard [name] is a 52 year old male with medical history of colon cancer status post surgery in remission, chronic alcohol abuse and chronic pancreatitis who presented to the emergency department complaining of persistent abdominal pain associated with nausea, vomiting and diarrhea.  Per family member patient was not himself and they called EMS upon evaluation was found to have blood sugars in the 27s.  He was given D10, but remained hypoglycemic and patient was brought to the ER.  In the ED CT abdomen/pelvis did not show any obstruction but shows some bowel dysmotility of the colonic anastomotic site with features of gastritis.  Urine shows some ketosis and patient was admitted with working diagnosis of hypoglycemia and alcoholic gastritis.  Patient was started on IV Pepcid, antiemetics and clear liquid diet.  Tolerated diet well.  Requested to advance diet to regular diet which he did well.  Hypoglycemia resolved and patient requested to be discharged home.  Discussed alcohol use with patient he reported he is not quitting drinking alcohol and is not interested on detoxification.  His abdominal pain has resolved, he is tolerating diet well and ambulating with no issues.  Patient was deemed stable for discharge and follow-up with PCP as an outpatient.  Subjective: Patient seen and examined, he has no complaints.  Tolerating regular diet well.  Denies chest pain, shortness of  breath, palpitations, nausea, vomiting and diarrhea. Want to go home.  Discharge Diagnoses/Hospital Course:  Alcoholic gastritis Patient with history of chronic pancreatitis and alcohol abuse.  CT shows some bowel dysmotility, but no obstruction.  Also with signs of gastritis.  Patient was started on IV Pepcid with resolution of symptoms and now tolerating oral diet well.  Advised to avoid alcohol.  Will discharge on Pepcid 20 mg twice daily, and Zofran as needed.  Hypoglycemia Likely related to alcohol abuse and poor oral intake.  Patient was treated with D10 and capillary blood glucose improved.  Beta hydroxybutyric acid elevated likely related to alcohol.  Cortisol levels were normal.    Isolated elevated troponin, EKG with nonspecific changes, troponin trend was negative.  Patient asymptomatic.  No need for further work-up at this point.  Alcohol abuse with ketoacidosis Cessation discussed with patient, patient was placed on CIWA protocol.  Patient report he is not interested on quitting at this time and does not want any detoxification for the moment.  Patient declined rehab.   Hypertension Likely related to alcohol abuse, prior to admission on labetalol, will start amlodipine.  DC labetalol due cost effective.   Chronic pancreatitis Secondary to alcohol abuse, lipase normal  All other chronic medical condition were stable during the hospitalization.  On the day of the discharge the patient's vitals were stable, and no other acute medical condition were reported by patient. the patient was felt safe to be discharge to home.  Discharge Instructions  You were cared for by a hospitalist during your hospital stay. If you have any questions about your discharge medications or the care you  received while you were in the hospital after you are discharged, you can call the unit and asked to speak with the hospitalist on call if the hospitalist that took care of you is not available. Once you  are discharged, your primary care physician will handle any further medical issues. Please note that NO REFILLS for any discharge medications will be authorized once you are discharged, as it is imperative that you return to your primary care physician (or establish a relationship with a primary care physician if you do not have one) for your aftercare needs so that they can reassess your need for medications and monitor your lab values.  Discharge Instructions    Call MD for:  difficulty breathing, headache or visual disturbances   Complete by:  As directed    Call MD for:  extreme fatigue   Complete by:  As directed    Call MD for:  hives   Complete by:  As directed    Call MD for:  persistant dizziness or light-headedness   Complete by:  As directed    Call MD for:  persistant nausea and vomiting   Complete by:  As directed    Call MD for:  redness, tenderness, or signs of infection (pain, swelling, redness, odor or green/yellow discharge around incision site)   Complete by:  As directed    Call MD for:  severe uncontrolled pain   Complete by:  As directed    Call MD for:  temperature >100.4   Complete by:  As directed    Diet - low sodium heart healthy   Complete by:  As directed    Increase activity slowly   Complete by:  As directed      Allergies as of 06/28/2018      Reactions   Aspirin Other (See Comments)   Acid reflux    Penicillins Hives   Has patient had a PCN reaction causing immediate rash, facial/tongue/throat swelling, SOB or lightheadedness with hypotension: yes Has patient had a PCN reaction causing severe rash involving mucus membranes or skin necrosis: no Has patient had a PCN reaction that required hospitalization: no Has patient had a PCN reaction occurring within the last 10 years: no If all of the above answers are "NO", then may proceed with Cephalosporin use.      Medication List    STOP taking these medications   labetalol 100 MG tablet Commonly  known as:  NORMODYNE   NEXIUM PO   Potassium Chloride ER 20 MEQ Tbcr     TAKE these medications   amLODipine 10 MG tablet Commonly known as:  NORVASC Take 1 tablet (10 mg total) by mouth daily.   famotidine 20 MG tablet Commonly known as:  PEPCID Take 1 tablet (20 mg total) by mouth 2 (two) times daily.   folic acid 1 MG tablet Commonly known as:  FOLVITE Take 1 tablet (1 mg total) by mouth daily.   ondansetron 8 MG disintegrating tablet Commonly known as:  ZOFRAN-ODT Take 1 tablet (8 mg total) by mouth every 8 (eight) hours as needed for nausea or vomiting.   thiamine 100 MG tablet Take 1 tablet (100 mg total) by mouth daily.      Follow-up Information    [facility] .   Contact information: [place] [id number] [phone]         Allergies  Allergen Reactions  . Aspirin Other (See Comments)  Acid reflux   . Penicillins Hives    Has patient had a PCN reaction causing immediate rash, facial/tongue/throat swelling, SOB or lightheadedness with hypotension: yes Has patient had a PCN reaction causing severe rash involving mucus membranes or skin necrosis: no Has patient had a PCN reaction that required hospitalization: no Has patient had a PCN reaction occurring within the last 10 years: no If all of the above answers are "NO", then may proceed with Cephalosporin use.     Consultations:  None   Procedures/Studies: Dg Chest 2 View  Result Date: 06/27/2018 CLINICAL DATA:  Weakness, fatigue EXAM: CHEST - 2 VIEW COMPARISON:  05/01/2018 FINDINGS: Heart and mediastinal contours are within normal limits. No focal opacities or effusions. No acute bony abnormality. Mild peribronchial thickening IMPRESSION: Mild bronchitic changes. Electronically Signed   By: Rolm Baptise M.D.   On: 06/27/2018 19:28   Ct Abdomen Pelvis W Contrast  Result Date: 06/27/2018 CLINICAL DATA:  Stomach pain, history of  stomach cancer. Nausea and vomiting with acute weakness. EXAM: CT ABDOMEN AND PELVIS WITH CONTRAST TECHNIQUE: Multidetector CT imaging of the abdomen and pelvis was performed using the standard protocol following bolus administration of intravenous contrast. CONTRAST:  117mL OMNIPAQUE IOHEXOL 300 MG/ML  SOLN COMPARISON:  05/02/2018 CT FINDINGS: Lower chest: Normal heart size without pericardial effusion. Lung bases are clear. Hepatobiliary: Steatosis of the liver with stable loud hepatomegaly. No enhancing liver lesions. No biliary dilatation. The gallbladder is distended likely from a fasting state. No mural thickening or gallstones. Patent portal veins. Pancreas: Normal Spleen: Normal Adrenals/Urinary Tract: Adrenal glands are unremarkable. Kidneys are normal, without renal calculi, focal lesion, or hydronephrosis. Bladder is unremarkable. Stomach/Bowel: Diffuse mild-to-moderate gastric fold thickening suspicious for changes of gastritis. No ulcer, focal mural or mucosal thickening nor mass. Normal small bowel rotation. Partial colectomy with patent anastomosis in the left lower quadrant. Probable component of bowel dysmotility given fecalized material within the lumen of the small and large bowel at the anastomotic site and also given the patulous appearance. No bowel obstruction. Slight swirling of the mesentery in the left hemiabdomen without closed-loop obstruction identified. Resolution of previously noted small bowel intussusception within the right hemiabdomen. Gas and stool noted within large bowel. Vascular/Lymphatic: Aortic atherosclerosis. No enlarged abdominal or pelvic lymph nodes. Reproductive: Prostate is unremarkable. Other: No abdominal wall hernia or abnormality. No abdominopelvic ascites. Musculoskeletal: No acute or significant osseous findings. IMPRESSION: 1. Thickened gastric mucosa query gastritis. 2. Patulous distended appearance at site of enterocolic anastomosis with fecalized material  within. Suspect a component of bowel dysmotility from prior surgery. No definite mechanical bowel obstruction. Resolution of previously noted small bowel intussusception from right hemiabdomen. 3. Stable hepatomegaly with steatosis of the liver. Distended gallbladder likely from a fasting state. Electronically Signed   By: Ashley Royalty M.D.   On: 06/27/2018 21:06   Dg Abd Acute W/chest  Result Date: 06/28/2018 CLINICAL DATA:  Abdominal pain, hypoglycemia. EXAM: DG ABDOMEN ACUTE W/ 1V CHEST COMPARISON:  Chest x-ray of June 27, 2018 and abdominal and pelvic CT scan of June 27, 2018. FINDINGS: The lungs are mildly hyperinflated. There is no focal infiltrate. A prominent left nipple shadow is observed. The heart and pulmonary vascularity are normal. There is calcification in the wall of the aortic arch. There is no pleural effusion. The bony thorax is unremarkable. Within the abdomen there is a moderate amount of gas within normal caliber small and large bowel loops. The stool burden is moderate. There is gas in  the region of the rectum. No free extraluminal gas collections are observed. There surgical clips in the left mid abdomen. No abnormal soft tissue calcifications are observed. IMPRESSION: Mild bronchitic-smoking related changes, stable. No acute cardiopulmonary abnormality. Thoracic aortic atherosclerosis. No evidence of bowel obstruction or ileus. The colonic stool burden is moderate. Electronically Signed   By: David  Martinique M.D.   On: 06/28/2018 07:30   Dg Foot Complete Right  Result Date: 06/27/2018 CLINICAL DATA:  Right foot pain EXAM: RIGHT FOOT COMPLETE - 3+ VIEW COMPARISON:  06/01/2015 FINDINGS: Old healed metatarsal fractures. No acute fracture, subluxation or dislocation. Mild pes planus. Small plantar calcaneal spur. Mild hallux valgus. IMPRESSION: No acute bony abnormality.  Mild hallux valgus and pes planus. Electronically Signed   By: Rolm Baptise M.D.   On: 06/27/2018 19:30      Discharge Exam: Vitals:   06/28/18 0600 06/28/18 0630  BP: (!) 158/96 (!) 160/77  Pulse: 63 66  Resp: 14 16  Temp:    SpO2: 100% 100%   Vitals:   06/28/18 0500 06/28/18 0530 06/28/18 0600 06/28/18 0630  BP: (!) 162/97 137/81 (!) 158/96 (!) 160/77  Pulse: 70 61 63 66  Resp: 15 16 14 16   Temp:      TempSrc:      SpO2: 99% 100% 100% 100%  Weight:      Height:       General: Pt is alert, awake, not in acute distress Cardiovascular: RRR, S1/S2 +, no rubs, no gallops Respiratory: CTA bilaterally, no wheezing, no rhonchi Abdominal: Soft, NT, ND, bowel sounds + Extremities: no edema, no cyanosis  The results of significant diagnostics from this hospitalization (including imaging, microbiology, ancillary and laboratory) are listed below for reference.     Microbiology: No results found for this or any previous visit (from the past 240 hour(s)).   Labs: BNP (last 3 results) No results for input(s): BNP in the last 8760 hours. Basic Metabolic Panel: Recent Labs  Lab 06/27/18 1807 06/28/18 0425  NA 137 131*  K 3.5 3.2*  CL 104 100  CO2 13* 19*  GLUCOSE 80 109*  BUN 5* <5*  CREATININE 0.99 0.86  CALCIUM 8.5* 8.4*   Liver Function Tests: Recent Labs  Lab 06/27/18 1807 06/28/18 0425  AST 49* 48*  ALT 21 22  ALKPHOS 99 87  BILITOT 1.1 1.8*  PROT 7.6 7.0  ALBUMIN 3.4* 3.2*   Recent Labs  Lab 06/27/18 1807  LIPASE 48   No results for input(s): AMMONIA in the last 168 hours. CBC: Recent Labs  Lab 06/27/18 1807 06/28/18 0425  WBC 9.6 7.0  NEUTROABS 7.7  --   HGB 12.5* 11.6*  HCT 39.6 35.1*  MCV 98.8 94.6  PLT 187 160   Cardiac Enzymes: Recent Labs  Lab 06/27/18 1807 06/28/18 0039 06/28/18 0425 06/28/18 1211  TROPONINI 0.05* <0.03 <0.03 <0.03   BNP: Invalid input(s): POCBNP CBG: Recent Labs  Lab 06/28/18 0356 06/28/18 0651 06/28/18 1115 06/28/18 1239 06/28/18 1325  GLUCAP 135* 91 124* 94 132*   D-Dimer No results for input(s):  DDIMER in the last 72 hours. Hgb A1c No results for input(s): HGBA1C in the last 72 hours. Lipid Profile No results for input(s): CHOL, HDL, LDLCALC, TRIG, CHOLHDL, LDLDIRECT in the last 72 hours. Thyroid function studies No results for input(s): TSH, T4TOTAL, T3FREE, THYROIDAB in the last 72 hours.  Invalid input(s): FREET3 Anemia work up No results for input(s): VITAMINB12, FOLATE, FERRITIN, TIBC, IRON, RETICCTPCT in the  last 72 hours. Urinalysis    Component Value Date/Time   COLORURINE YELLOW 06/27/2018 1934   APPEARANCEUR CLEAR 06/27/2018 1934   LABSPEC 1.014 06/27/2018 1934   PHURINE 6.0 06/27/2018 1934   GLUCOSEU NEGATIVE 06/27/2018 1934   HGBUR SMALL (A) 06/27/2018 1934   BILIRUBINUR NEGATIVE 06/27/2018 1934   KETONESUR 80 (A) 06/27/2018 1934   PROTEINUR 30 (A) 06/27/2018 1934   UROBILINOGEN 0.2 01/13/2009 1011   NITRITE NEGATIVE 06/27/2018 1934   LEUKOCYTESUR NEGATIVE 06/27/2018 1934   Sepsis Labs Invalid input(s): PROCALCITONIN,  WBC,  LACTICIDVEN Microbiology No results found for this or any previous visit (from the past 240 hour(s)).   Time coordinating discharge: 32 minutes  SIGNED:  Chipper Oman, MD  [facility] 06/28/2018, 2:01 PM  Pager please text page via  [email or web address]  Note - This record has been created using [employer]. Chart creation errors have been sought, but may not always have been located. Such creation errors do not reflect on the standard of medical care.

## 2018-06-28 NOTE — ED Notes (Signed)
Pt left AMA °

## 2018-06-28 NOTE — ED Notes (Signed)
Pt has received his lunch and is now eating his lunch

## 2018-06-28 NOTE — ED Notes (Signed)
Ordered breakfast tray  

## 2018-06-29 LAB — C-PEPTIDE: C PEPTIDE: 1.5 ng/mL (ref 1.1–4.4)

## 2018-07-10 ENCOUNTER — Ambulatory Visit: Payer: Self-pay | Admitting: Internal Medicine

## 2018-07-17 ENCOUNTER — Ambulatory Visit: Payer: Self-pay | Admitting: Family Medicine

## 2018-07-20 ENCOUNTER — Encounter: Payer: Self-pay | Admitting: Gastroenterology

## 2018-07-20 ENCOUNTER — Other Ambulatory Visit: Payer: Self-pay

## 2018-07-20 ENCOUNTER — Ambulatory Visit: Payer: Self-pay | Attending: Family Medicine | Admitting: Licensed Clinical Social Worker

## 2018-07-20 ENCOUNTER — Encounter: Payer: Self-pay | Admitting: Family Medicine

## 2018-07-20 ENCOUNTER — Ambulatory Visit: Payer: Self-pay | Attending: Family Medicine | Admitting: Family Medicine

## 2018-07-20 VITALS — BP 156/88 | HR 95 | Temp 98.3°F | Resp 18 | Ht 72.0 in | Wt 124.0 lb

## 2018-07-20 DIAGNOSIS — K860 Alcohol-induced chronic pancreatitis: Secondary | ICD-10-CM

## 2018-07-20 DIAGNOSIS — Z800 Family history of malignant neoplasm of digestive organs: Secondary | ICD-10-CM | POA: Insufficient documentation

## 2018-07-20 DIAGNOSIS — M79671 Pain in right foot: Secondary | ICD-10-CM

## 2018-07-20 DIAGNOSIS — Z85038 Personal history of other malignant neoplasm of large intestine: Secondary | ICD-10-CM

## 2018-07-20 DIAGNOSIS — Z23 Encounter for immunization: Secondary | ICD-10-CM

## 2018-07-20 DIAGNOSIS — E162 Hypoglycemia, unspecified: Secondary | ICD-10-CM | POA: Insufficient documentation

## 2018-07-20 DIAGNOSIS — E43 Unspecified severe protein-calorie malnutrition: Secondary | ICD-10-CM

## 2018-07-20 DIAGNOSIS — F17210 Nicotine dependence, cigarettes, uncomplicated: Secondary | ICD-10-CM | POA: Insufficient documentation

## 2018-07-20 DIAGNOSIS — F1010 Alcohol abuse, uncomplicated: Secondary | ICD-10-CM

## 2018-07-20 DIAGNOSIS — R197 Diarrhea, unspecified: Secondary | ICD-10-CM | POA: Insufficient documentation

## 2018-07-20 DIAGNOSIS — F10288 Alcohol dependence with other alcohol-induced disorder: Secondary | ICD-10-CM

## 2018-07-20 DIAGNOSIS — Z880 Allergy status to penicillin: Secondary | ICD-10-CM | POA: Insufficient documentation

## 2018-07-20 DIAGNOSIS — F419 Anxiety disorder, unspecified: Secondary | ICD-10-CM

## 2018-07-20 DIAGNOSIS — Z809 Family history of malignant neoplasm, unspecified: Secondary | ICD-10-CM | POA: Insufficient documentation

## 2018-07-20 DIAGNOSIS — R053 Chronic cough: Secondary | ICD-10-CM

## 2018-07-20 DIAGNOSIS — R05 Cough: Secondary | ICD-10-CM

## 2018-07-20 DIAGNOSIS — F1020 Alcohol dependence, uncomplicated: Secondary | ICD-10-CM | POA: Insufficient documentation

## 2018-07-20 DIAGNOSIS — W208XXA Other cause of strike by thrown, projected or falling object, initial encounter: Secondary | ICD-10-CM | POA: Insufficient documentation

## 2018-07-20 DIAGNOSIS — G8929 Other chronic pain: Secondary | ICD-10-CM

## 2018-07-20 DIAGNOSIS — I10 Essential (primary) hypertension: Secondary | ICD-10-CM

## 2018-07-20 DIAGNOSIS — Z79899 Other long term (current) drug therapy: Secondary | ICD-10-CM | POA: Insufficient documentation

## 2018-07-20 DIAGNOSIS — M25562 Pain in left knee: Secondary | ICD-10-CM

## 2018-07-20 DIAGNOSIS — Z886 Allergy status to analgesic agent status: Secondary | ICD-10-CM | POA: Insufficient documentation

## 2018-07-20 DIAGNOSIS — K219 Gastro-esophageal reflux disease without esophagitis: Secondary | ICD-10-CM

## 2018-07-20 DIAGNOSIS — F331 Major depressive disorder, recurrent, moderate: Secondary | ICD-10-CM

## 2018-07-20 DIAGNOSIS — Z8711 Personal history of peptic ulcer disease: Secondary | ICD-10-CM | POA: Insufficient documentation

## 2018-07-20 DIAGNOSIS — R110 Nausea: Secondary | ICD-10-CM

## 2018-07-20 MED ORDER — ONDANSETRON 8 MG PO TBDP: 8.0000 mg | ORAL_TABLET | Freq: Three times a day (TID) | ORAL | 0 refills | Status: DC | PRN

## 2018-07-20 MED ORDER — DOXYCYCLINE HYCLATE 100 MG PO TABS: 100.0000 mg | ORAL_TABLET | Freq: Two times a day (BID) | ORAL | 0 refills | Status: DC

## 2018-07-20 MED ORDER — AMLODIPINE BESYLATE 10 MG PO TABS: 10.0000 mg | ORAL_TABLET | Freq: Every day | ORAL | 6 refills | Status: DC

## 2018-07-20 MED ORDER — PANTOPRAZOLE SODIUM 40 MG PO TBEC: 40.0000 mg | DELAYED_RELEASE_TABLET | Freq: Two times a day (BID) | ORAL | 3 refills | Status: DC

## 2018-07-20 MED ORDER — FOLIC ACID 1 MG PO TABS: 1.0000 mg | ORAL_TABLET | Freq: Every day | ORAL | 6 refills | Status: DC

## 2018-07-20 MED ORDER — PREDNISONE 20 MG PO TABS: ORAL_TABLET | ORAL | 0 refills | Status: DC

## 2018-07-20 MED ORDER — THIAMINE HCL 100 MG PO TABS: 100.0000 mg | ORAL_TABLET | Freq: Every day | ORAL | 6 refills | Status: DC

## 2018-07-20 NOTE — Patient Instructions (Signed)

## 2018-07-20 NOTE — Progress Notes (Signed)
Subjective:    Patient ID: Richard [name], male    DOB: 02-08-1966, 52 y.o.   MRN: [id number]  HPI 52 year old male new to the practice.  Patient is status post recent emergency department visit on 06/27/2018.  Per emergency department notes, patient had episode of hypoglycemia and per EMS, his blood sugar was 45 and he was given oral glucose with increase of his blood sugar to 107.  Patient with history of alcohol abuse with alcoholic ketosis and patient with chronic abdominal pain related to pancreatitis.  Patient additionally with a past history of colon cancer which is been in remission since 2007 but patient states he has had no recent follow-up with either GI or oncology.  Patient reports that he did have a colonoscopy which he believes was in 2014 after his initial surgery for colon cancer in 2007.  Patient feels as if he has difficulty gaining weight and maintaining weight.        Patient also with complaint of chronic issues with coughing which usually occurs each morning and patient states that he often coughs until he throws up.  Patient does feel at times as if he has postnasal drainage and mucus in his throat.  Patient has not thrown up any blood.  Patient states that the cough is usually worse in the morning.  Patient sometimes feels lightheaded after coughing.   patient does smoke.  Patient states that he was told in the emergency room recently that he did have an abnormality on his chest x-ray but patient is not sure what he was told.       Patient also reports issues with fatigue/no energy.  Patient also has complaint of chronic right foot pain since suffering an injury in which the equipment fell on his foot at work.  Patient states that he was diagnosed with a fracture of his foot but did not require surgery but had to wear a cast on his right foot for a long time.  Patient also states that when he was younger he suffered a left knee injury in high school and he believes that his gait over  time was abnormal due to the knee pain which also caused him to have increased right foot pain over time after his injury.  Patient reports he does occasionally take an over-the-counter pain medication such as ibuprofen or Aleve for his pain but does not believe that he is taken any medication recently.  Patient states the pain is in his knee is worse with walking and with weather changes and this pain is about a 4 -5 on a 0-to-10 scale however his foot pain ranges from a 6 to an 8 and sometimes greater.        Patient also reports chronic abdominal pain that sometimes is a 3 or 4 but often anywhere from an 8-10 and at today's visit, patient states that for the past few days he has had the sensation that the pain radiates from his mid upper abdomen to his back.  Patient states that he has this sensation often.  Patient reports that he does still drink a few beers on most days (Patient is accompanied at today's visit by his girlfriend who when not in the presence of the patient, stated that patient drinks at least a pint of alcohol on a daily basis in addition to beer.  Patient's girlfriend states that she was recently diagnosed with cancer related to her use of alcohol and she is stop drinking but  she states that patient continues to drink alcohol daily and she is trying to encourage him to seek treatment for his issues with alcohol).  Patient reports that he does have issues with substernal burning, chronic nausea as well as bad tasting liquid in his throat and mouth at times.  Patient states that he is taking over-the-counter Nexium to help with acid reflux but this has not been helping.  Patient reports that he has never had an EGD.  Patient reports that he was prescribed Creon but patient states that he was not really sure what this medication did therefore he has not been taking this medication and has a whole bottle at home.  Patient states that he does have issues with diarrhea at times.      Patient  reports history of hypertension.  Patient is not sure if he took his blood pressure medicine prior to today's visit.  Patient denies any headaches associated with his blood pressure.  In  Past Medical History:  Diagnosis Date  . Acid reflux   . Colon cancer ([place])   . Colon cancer ([place])   . PUD (peptic ulcer disease)    Past Surgical History:  Procedure Laterality Date  . COLON RESECTION     Family History  Problem Relation Age of Onset  . Colon cancer Father   . Cancer Sister   . CAD Neg Hx   . Stroke Neg Hx   . Diabetes Neg Hx   . Social History   Tobacco Use  . Smoking status: Current Every Day Smoker    Packs/day: 1.00  . Smokeless tobacco: Never Used  Substance Use Topics  . Alcohol use: Yes    Alcohol/week: 2.0 standard drinks    Types: 1 Cans of beer, 1 Shots of liquor per week    Comment: heavy alcohol abuse a beer and couple of shots a day for past  14 years  . Drug use: No   Allergies  Allergen Reactions  . Aspirin Other (See Comments)    Acid reflux   . Penicillins Hives    Has patient had a PCN reaction causing immediate rash, facial/tongue/throat swelling, SOB or lightheadedness with hypotension: yes Has patient had a PCN reaction causing severe rash involving mucus membranes or skin necrosis: no Has patient had a PCN reaction that required hospitalization: no Has patient had a PCN reaction occurring within the last 10 years: no If all of the above answers are "NO", then may proceed with Cephalosporin use.       Review of Systems  Constitutional: Positive for appetite change and fatigue. Negative for chills, diaphoresis and fever.  HENT: Positive for congestion, postnasal drip, rhinorrhea and sore throat. Negative for trouble swallowing.   Respiratory: Positive for cough. Negative for chest tightness, shortness of breath and wheezing.   Cardiovascular: Positive for chest pain, palpitations and leg swelling.  Gastrointestinal: Positive for abdominal  pain, diarrhea, nausea and vomiting. Negative for blood in stool.  Musculoskeletal: Positive for arthralgias, back pain, gait problem and joint swelling.  Neurological: Positive for light-headedness. Negative for dizziness and headaches.  Hematological: Negative for adenopathy. Does not bruise/bleed easily.  Psychiatric/Behavioral: Negative for self-injury and suicidal ideas. The patient is nervous/anxious.        Objective:   Physical Exam BP (!) 156/88   Pulse 95   Temp 98.3 F (36.8 C) (Oral)   Resp 18   Ht 6' (1.829 m)   Wt 124 lb (56.2 kg)   SpO2 99%  BMI 16.82 kg/m Nurse's notes and vital signs reviewed General- thin appearing older male, patient is wearing layers of clothing which make him appeared to weigh more who was sitting on the exam table in no acute distress but patient does not appear to feel well ENT- right TM dull, left TM obscured by impacted cerumen, nares with edema/erythema of the nasal turbinates with mild clear nasal discharge, patient with edema/erythema of the oral mucosa and posterior oropharynx and patient with a slight brownish tint on the posterior portion of the tongue but patient's oral mucosa appears moist Neck-supple, no lymphadenopathy, borderline thyromegaly, no carotid bruit Lungs- clear to auscultation bilaterally with some decrease in breath sounds at the lung bases Cardiovascular-regular rate and rhythm Abdomen- slightly distended, slightly hyperactive bowel sounds, positive epigastric tenderness but patient does not have rebound, possible mild voluntary guarding.  Patient with a healed midline vertical surgical scar which starts below the umbilicus to just above the pubis Back-no CVA tenderness, patient with complaint of lumbosacral tenderness to palpation and patient has some lumbar paraspinous spasm Extremities-no edema Musculoskeletal- patient with some left medial joint line tenderness of the knee and patient with complaint of right lateral  midfoot and top of the foot discomfort to palpation and patient with mild right pedal edema       Assessment & Plan:  1. Essential hypertension Patient is provided with refill of amlodipine for treatment of hypertension.  Patient is encouraged to remain compliant with his medications - amLODipine (NORVASC) 10 MG tablet; Take 1 tablet (10 mg total) by mouth daily. To lower blood pressure  Dispense: 30 tablet; Refill: 6  2. Chronic cough Patient with complaint of issues with chronic cough which usually occurs in the mornings and causes him to cough until he throws up.  Patient does have a long history of tobacco use and reports some symptoms that indicate possible allergic rhinitis with postnasal drainage as a contributing factor.  Patient however also with reflux symptoms which could be contributing to his cough.  On review of records from his emergency department visit, patient with chest x-ray findings of hyperinflated lungs and changes suggestive of chronic bronchitis.  Patient will be placed on a prednisone for 5 days and patient is to make sure that he eats prior to taking prednisone and patient will be placed on doxycycline as patient's cough could be related to chronic bronchitis/COPD.  Patient will be referred to see a pulmonologist here at this clinic.  Discussed the need for smoking cessation with the patient at today's visit. - Ambulatory referral to Pulmonology - doxycycline (VIBRA-TABS) 100 MG tablet; Take 1 tablet (100 mg total) by mouth 2 (two) times daily.  Dispense: 20 tablet; Refill: 0 - predniSONE (DELTASONE) 20 MG tablet; 2 pills once per day for 5 days; eat before taking the medication  Dispense: 10 tablet; Refill: 0  3. Chronic nausea Patient with issues with chronic nausea and I discussed with the patient that he likely needs to have EGD done by GI as patient with alcohol abuse and reflux symptoms which can increase the possibility of chronic irritation to the esophagus.   Patient also with chronic pancreatitis which is likely also contributing to his nausea.  Patient states that he does have Zofran which was recently prescribed and patient would like to have a refill this medication.  Patient is also being placed on pantoprazole 40 mg twice daily to help suppress stomach acid. - Ambulatory referral to Gastroenterology - ondansetron (ZOFRAN ODT) 8 MG  disintegrating tablet; Take 1 tablet (8 mg total) by mouth every 8 (eight) hours as needed for nausea or vomiting.  Dispense: 30 tablet; Refill: 0 - pantoprazole (PROTONIX) 40 MG tablet; Take 1 tablet (40 mg total) by mouth 2 (two) times daily. 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  Patient will have repeat lipase level at today's visit.  Patient will also be referred to gastroenterology for further evaluation and treatment.  Discussed with patient the role of Creon and helping to replace pancreatic enzymes that are needed to help with digestion and patient agrees to start the use of this medication.  Also discussed the role of the pancreas in insulin production and regulation of blood sugar and discussed how his issues with hypoglycemia are likely related to his pancreatitis (and possibly liver as patient may also have some alcoholic hepatitis/cirrhosis due to his chronic alcohol use.) - Lipase - Ambulatory referral to Gastroenterology - folic acid (FOLVITE) 1 MG tablet; Take 1 tablet (1 mg total) by mouth daily.  Dispense: 30 tablet; Refill: 6 - ondansetron (ZOFRAN ODT) 8 MG disintegrating tablet; Take 1 tablet (8 mg total) by mouth every 8 (eight) hours as needed for nausea or vomiting.  Dispense: 30 tablet; Refill: 0 - thiamine 100 MG tablet; Take 1 tablet (100 mg total) by mouth daily.  Dispense: 30 tablet; Refill: 6  5. Right foot pain Patient reports issues with chronic foot pain status post  injury.  Patient unfortunately has other medical issues which limit his use of Tylenol and NSAIDs for pain.  Patient should make sure that he is wearing comfortable, supportive shoes  6. Chronic pain of left knee Patient reports remote history of injury to the left knee while playing sports in high school and patient now with issues with recurrent pain and he believes that this is also affected his gait over time.  Patient may use an over-the-counter knee brace/sleeve to see if this will help with the knee pain and take low-dose over-the-counter pain medications as needed but because of his alcohol use, patient should try to limit/avoid nonsteroidal anti-inflammatories as well as Tylenol.  Patient can also see if warm moist heat or cold compresses to the knee help and then use whichever method gives better pain relief when needed  7. Gastroesophageal reflux disease, esophagitis presence not specified Patient will be placed on Protonix 40 mg twice daily.  Patient is aware of the need to stop alcohol use as this would likely improve his reflux symptoms.  Patient is also being referred to GI for further evaluation and treatment - pantoprazole (PROTONIX) 40 MG tablet; Take 1 tablet (40 mg total) by mouth 2 (two) times daily. To reduce stomach acid  Dispense: 60 tablet; Refill: 3  8. History of colon cancer Patient will have CBC to look for blood loss as patient with complaint of epigastric pain as well as history of colon cancer.  Patient is also being referred to gastroenterology - CBC with Differential - Ambulatory referral to Gastroenterology  9. Protein-calorie malnutrition, severe Patient with severe protein calorie malnutrition.  Patient has been asked to restart the use of Creon to help with digestion of food.  Patient unfortunately has had colon cancer with removal of portion of the colon but I do not know the full details of the surgery but this may also be affecting his caloric absorption.   Patient also with alcohol abuse  and likely does not eat healthy food choices and with diminished appetite secondary to abdominal pain from his pancreatitis as well as the chronic nausea and acid reflux symptoms.  Patient is encouraged to eat several small meals throughout the day and will try to see if there are resources available for high-protein dietary supplements. - Comprehensive metabolic panel  10.  Alcohol dependence Patient with alcoholism with chronic pancreatitis and poor nutritional status.  Patient will meet with social worker to discuss resources.  Patient is not yet ready to acknowledge his alcohol dependence issues and is not ready to seek treatment.  11. Need for immunization against influenza Patient was offered and received influenza immunization at today's visit  .An After Visit Summary was printed and given to the patient.  Return in about 4 weeks (around 08/17/2018).

## 2018-07-20 NOTE — Progress Notes (Signed)
Pain; right foot, 2017 broken, sharp Left knee/ right burn pain, tingling in fingers   Flu:   Stomach checked, giving him a lot of pain and blood sugar, ED requested to be checked with pcp.   Cbg: 91 A1c: 4.7   nexium  Over the counter pain meds prn   Cough everymorning makes him throw up and at night, dry cough

## 2018-07-23 NOTE — BH Specialist Note (Signed)
Integrated Behavioral Health Initial Visit  MRN: [id number] Name: Richard [name]  Number of [facility] Clinician visits:: 1/6 Session Start time: 10:30 AM  Session End time: 11:00 AM Total time: 30 minutes  Type of Service: [place] Interpretor:No. Interpretor Name and Language: N/A   Warm Hand Off Completed.       SUBJECTIVE: Richard [name] is a 52 y.o. male accompanied by Partner/Significant Other Patient was referred by Dr. Chapman Fitch for depression and anxiety. Patient reports the following symptoms/concerns: feelings of sadness and worry, racing thoughts, loss of appetite, chronic pain, and substance use (alcohol) Duration of problem: Ongoing; Severity of problem: severe  OBJECTIVE: Mood: Anxious and Affect: Appropriate Risk of harm to self or others: No plan to harm self or others  LIFE CONTEXT: Family and Social: Pt receives support from family and friends School/Work: Pt has pending disability case. He is uninsured Self-Care: Pt reports daily alcohol use (pint of vodka) to cope with stressors Life Changes: Pt has difficulty managing ongoing medical conditions. He experiences chronic pain and reports substance use hx.   GOALS ADDRESSED: Patient will: 1. Reduce symptoms of: anxiety and depression 2. Increase knowledge and/or ability of: coping skills and healthy habits  3. Demonstrate ability to: Increase adequate support systems for patient/family and Decrease self-medicating behaviors  INTERVENTIONS: Interventions utilized: Solution-Focused Strategies, Supportive Counseling, Psychoeducation and/or Health Education and Link to [employer]  Standardized Assessments completed: GAD-7 and PHQ 2&9  ASSESSMENT: Patient currently experiencing depression and anxiety triggered by difficulty managing ongoing medical conditions. He experiences chronic pain and reports substance use hx. Pt's symptoms consist of feelings of  sadness and worry, racing thoughts, loss of appetite, and chronic pain. Pt receives strong support from girlfriend, who was present during visit, in addition, to family and friends who reside locally.    Patient may benefit from psychoeducation, psychotherapy, and medication management. [place] educated pt on the correlation between one's physical and mental health, in addition, to how substance use can negatively impact both. Therapeutic interventions were discussed to decrease symptoms. Pt was provided supportive resources to assist with food insecurity and financial strain. Pt is not interested in substance use treatment resources at this time.   PLAN: 1. Follow up with behavioral health clinician on : Pt was encouraged to contact [facility] if symptoms worsen or fail to improve to schedule behavioral appointments at [facility]. 2. Behavioral recommendations: [facility] recommends that pt apply healthy coping skills discussed and utilize provided resources. Pt is encouraged to schedule follow up appointment with [facility] 3. Referral(s): [place] (In Clinic) and [employer] Resources:  Presenter, broadcasting 4. "From scale of 1-10, how likely are you to follow plan?":   Rebekah Chesterfield, LCSW 07/24/18 10:36 AM

## 2018-07-26 ENCOUNTER — Ambulatory Visit: Payer: Self-pay | Admitting: Gastroenterology

## 2018-08-01 ENCOUNTER — Ambulatory Visit: Payer: Self-pay

## 2018-08-03 DIAGNOSIS — K859 Acute pancreatitis without necrosis or infection, unspecified: Secondary | ICD-10-CM

## 2018-08-03 HISTORY — DX: Acute pancreatitis without necrosis or infection, unspecified: K85.90

## 2018-08-07 ENCOUNTER — Ambulatory Visit: Payer: Self-pay | Admitting: Gastroenterology

## 2018-08-13 ENCOUNTER — Other Ambulatory Visit: Payer: Self-pay

## 2018-08-13 ENCOUNTER — Inpatient Hospital Stay (HOSPITAL_COMMUNITY)
Admission: EM | Admit: 2018-08-13 | Discharge: 2018-08-15 | DRG: 438 | Discharge status: Against Medical Advice | Payer: Self-pay | Attending: Internal Medicine | Admitting: Internal Medicine

## 2018-08-13 ENCOUNTER — Encounter (HOSPITAL_COMMUNITY): Payer: Self-pay | Admitting: Emergency Medicine

## 2018-08-13 DIAGNOSIS — Z85038 Personal history of other malignant neoplasm of large intestine: Secondary | ICD-10-CM

## 2018-08-13 DIAGNOSIS — K859 Acute pancreatitis without necrosis or infection, unspecified: Principal | ICD-10-CM | POA: Diagnosis present

## 2018-08-13 DIAGNOSIS — Z880 Allergy status to penicillin: Secondary | ICD-10-CM

## 2018-08-13 DIAGNOSIS — Z5329 Procedure and treatment not carried out because of patient's decision for other reasons: Secondary | ICD-10-CM | POA: Diagnosis not present

## 2018-08-13 DIAGNOSIS — Z681 Body mass index (BMI) 19 or less, adult: Secondary | ICD-10-CM

## 2018-08-13 DIAGNOSIS — Z800 Family history of malignant neoplasm of digestive organs: Secondary | ICD-10-CM

## 2018-08-13 DIAGNOSIS — E860 Dehydration: Secondary | ICD-10-CM | POA: Diagnosis present

## 2018-08-13 DIAGNOSIS — Z79899 Other long term (current) drug therapy: Secondary | ICD-10-CM

## 2018-08-13 DIAGNOSIS — R251 Tremor, unspecified: Secondary | ICD-10-CM | POA: Diagnosis present

## 2018-08-13 DIAGNOSIS — I10 Essential (primary) hypertension: Secondary | ICD-10-CM

## 2018-08-13 DIAGNOSIS — F1010 Alcohol abuse, uncomplicated: Secondary | ICD-10-CM | POA: Diagnosis present

## 2018-08-13 DIAGNOSIS — F17210 Nicotine dependence, cigarettes, uncomplicated: Secondary | ICD-10-CM | POA: Diagnosis present

## 2018-08-13 DIAGNOSIS — Z8711 Personal history of peptic ulcer disease: Secondary | ICD-10-CM

## 2018-08-13 DIAGNOSIS — K219 Gastro-esophageal reflux disease without esophagitis: Secondary | ICD-10-CM | POA: Diagnosis present

## 2018-08-13 DIAGNOSIS — E43 Unspecified severe protein-calorie malnutrition: Secondary | ICD-10-CM | POA: Diagnosis present

## 2018-08-13 DIAGNOSIS — K319 Disease of stomach and duodenum, unspecified: Secondary | ICD-10-CM

## 2018-08-13 DIAGNOSIS — R64 Cachexia: Secondary | ICD-10-CM | POA: Diagnosis present

## 2018-08-13 DIAGNOSIS — Z886 Allergy status to analgesic agent status: Secondary | ICD-10-CM

## 2018-08-13 DIAGNOSIS — R112 Nausea with vomiting, unspecified: Secondary | ICD-10-CM | POA: Diagnosis present

## 2018-08-13 DIAGNOSIS — F17200 Nicotine dependence, unspecified, uncomplicated: Secondary | ICD-10-CM

## 2018-08-13 DIAGNOSIS — F1020 Alcohol dependence, uncomplicated: Secondary | ICD-10-CM | POA: Diagnosis present

## 2018-08-13 HISTORY — DX: Acute pancreatitis without necrosis or infection, unspecified: K85.90

## 2018-08-13 LAB — CBC
HEMATOCRIT: 41.4 % (ref 39.0–52.0)
Hemoglobin: 12.8 g/dL — ABNORMAL LOW (ref 13.0–17.0)
MCH: 30.3 pg (ref 26.0–34.0)
MCHC: 30.9 g/dL (ref 30.0–36.0)
MCV: 97.9 fL (ref 80.0–100.0)
Platelets: 198 10*3/uL (ref 150–400)
RBC: 4.23 MIL/uL (ref 4.22–5.81)
RDW: 16.3 % — ABNORMAL HIGH (ref 11.5–15.5)
WBC: 8.7 10*3/uL (ref 4.0–10.5)
nRBC: 0 % (ref 0.0–0.2)

## 2018-08-13 LAB — LIPASE, BLOOD: Lipase: 987 U/L — ABNORMAL HIGH (ref 11–51)

## 2018-08-13 LAB — COMPREHENSIVE METABOLIC PANEL
ALK PHOS: 83 U/L (ref 38–126)
ALT: 41 U/L (ref 0–44)
AST: 76 U/L — AB (ref 15–41)
Albumin: 4.8 g/dL (ref 3.5–5.0)
Anion gap: 15 (ref 5–15)
BILIRUBIN TOTAL: 1.1 mg/dL (ref 0.3–1.2)
BUN: 12 mg/dL (ref 6–20)
CALCIUM: 10 mg/dL (ref 8.9–10.3)
CHLORIDE: 106 mmol/L (ref 98–111)
CO2: 14 mmol/L — ABNORMAL LOW (ref 22–32)
Creatinine, Ser: 0.9 mg/dL (ref 0.61–1.24)
GFR calc Af Amer: >60 mL/min (ref >60)
Glucose, Bld: 130 mg/dL — ABNORMAL HIGH (ref 70–99)
Potassium: 4.3 mmol/L (ref 3.5–5.1)
Sodium: 135 mmol/L (ref 135–145)
TOTAL PROTEIN: 9 g/dL — AB (ref 6.5–8.1)

## 2018-08-13 LAB — I-STAT CG4 LACTIC ACID, ED: Lactic Acid, Venous: 1.01 mmol/L (ref 0.5–1.9)

## 2018-08-13 MED ORDER — SODIUM CHLORIDE 0.9 % IV BOLUS
1000.0000 mL | Freq: Once | INTRAVENOUS | Status: AC
  Administered 2018-08-14: 1000 mL via INTRAVENOUS

## 2018-08-13 MED ORDER — FENTANYL CITRATE (PF) 100 MCG/2ML IJ SOLN
100.0000 ug | Freq: Once | INTRAMUSCULAR | Status: AC
  Administered 2018-08-14: 100 ug via INTRAVENOUS
  Filled 2018-08-13: qty 2

## 2018-08-13 MED ORDER — ONDANSETRON HCL 4 MG/2ML IJ SOLN
4.0000 mg | Freq: Once | INTRAMUSCULAR | Status: AC
  Administered 2018-08-14: 4 mg via INTRAVENOUS
  Filled 2018-08-13: qty 2

## 2018-08-13 MED ORDER — FAMOTIDINE IN NACL 20-0.9 MG/50ML-% IV SOLN
20.0000 mg | Freq: Once | INTRAVENOUS | Status: AC
  Administered 2018-08-14: 20 mg via INTRAVENOUS
  Filled 2018-08-13: qty 50

## 2018-08-13 NOTE — ED Provider Notes (Signed)
[facility] DE[facility] Provider Note: Georgena Spurling, MD, FACEP  CSN: [id number] MRN: [id number] ARRIVAL: 08/13/18 at 2211 ROOM: [place]  Abdominal Pain   HISTORY OF PRESENT ILLNESS  08/13/18 11:22 PM Richard [name] is a 52 y.o. male with history of alcohol abuse and pancreatitis.  He is here with a 2-day history of nausea and vomiting abdominal pain.  He describes the abdominal pain is generalized and sharp.  He rates it as a 9-1/2 out of 10.  It is somewhat worse with movement or palpation.  He feels very weak and can feel his heart pounding.  His mouth feels dry.  He states his acid reflux is acting up and he has not been able to keep down his antacids.  He denies diarrhea.    Past Medical History:  Diagnosis Date  . Acid reflux   . Colon cancer ([place])   . Pancreatitis   . PUD (peptic ulcer disease)     Past Surgical History:  Procedure Laterality Date  . COLON RESECTION      Family History  Problem Relation Age of Onset  . Colon cancer Father   . Cancer Sister   . CAD Neg Hx   . Stroke Neg Hx   . Diabetes Neg Hx     Social History   Tobacco Use  . Smoking status: Current Every Day Smoker    Packs/day: 1.00  . Smokeless tobacco: Never Used  Substance Use Topics  . Alcohol use: Yes    Alcohol/week: 2.0 standard drinks    Types: 1 Cans of beer, 1 Shots of liquor per week    Comment: heavy alcohol abuse a beer and couple of shots a day for past  14 years  . Drug use: No    Prior to Admission medications   Medication Sig Start Date End Date Taking? Authorizing Provider  folic acid (FOLVITE) 1 MG tablet Take 1 tablet (1 mg total) by mouth daily. 07/20/18  Yes Fulp, Cammie, MD  amLODipine (NORVASC) 10 MG tablet Take 1 tablet (10 mg total) by mouth daily. To lower blood pressure 07/20/18 08/19/18  Fulp, Cammie, MD  doxycycline (VIBRA-TABS) 100 MG tablet Take 1 tablet (100 mg total) by mouth 2 (two) times daily. Patient not taking: Reported on  08/14/2018 07/20/18   Fulp, Ander Gaster, MD  famotidine (PEPCID) 20 MG tablet Take 1 tablet (20 mg total) by mouth 2 (two) times daily. Patient not taking: Reported on 07/20/2018 06/28/18   Patrecia Pour, Christean Grief, MD  ondansetron (ZOFRAN ODT) 8 MG disintegrating tablet Take 1 tablet (8 mg total) by mouth every 8 (eight) hours as needed for nausea or vomiting. Patient not taking: Reported on 08/14/2018 07/20/18   Fulp, Ander Gaster, MD  pantoprazole (PROTONIX) 40 MG tablet Take 1 tablet (40 mg total) by mouth 2 (two) times daily. To reduce stomach acid 07/20/18   Fulp, Cammie, MD  predniSONE (DELTASONE) 20 MG tablet 2 pills once per day for 5 days; eat before taking the medication Patient not taking: Reported on 08/14/2018 07/20/18   Fulp, Ander Gaster, MD  thiamine 100 MG tablet Take 1 tablet (100 mg total) by mouth daily. 07/20/18   Fulp, Cammie, MD    Allergies Aspirin and Penicillins   REVIEW OF SYSTEMS  Negative except as noted here or in the History of Present Illness.   PHYSICAL EXAMINATION  Initial Vital Signs Blood pressure (!) 152/86, pulse 96, temperature 98.1 F (36.7 C), temperature source Oral, resp. rate 14,  height 6' (1.829 m), weight 54.4 kg, SpO2 100 %.  Examination General: Well-developed, well-nourished male in no acute distress; appearance consistent with age of record HENT: normocephalic; atraumatic; ketotic breath Eyes: pupils equal, round and reactive to light; extraocular muscles intact Neck: supple Heart: regular rate and rhythm Lungs: clear to auscultation bilaterally Abdomen: soft; nondistended; mild diffuse tenderness; no masses or hepatosplenomegaly; bowel sounds present Extremities: No deformity; full range of motion; pulses normal Neurologic: Awake, alert, oriented; motor function intact in all extremities and symmetric; no facial droop Skin: Warm and dry Psychiatric: Flat affect   RESULTS  Summary of this visit's results, reviewed by myself:   EKG  Interpretation  Date/Time:    Ventricular Rate:    PR Interval:    QRS Duration:   QT Interval:    QTC Calculation:   R Axis:     Text Interpretation:        Laboratory Studies: Results for orders placed or performed during the hospital encounter of 08/13/18 (from the past 24 hour(s))  Lipase, blood     Status: Abnormal   Collection Time: 08/13/18 10:33 PM  Result Value Ref Range   Lipase 987 (H) 11 - 51 U/L  Comprehensive metabolic panel     Status: Abnormal   Collection Time: 08/13/18 10:33 PM  Result Value Ref Range   Sodium 135 135 - 145 mmol/L   Potassium 4.3 3.5 - 5.1 mmol/L   Chloride 106 98 - 111 mmol/L   CO2 14 (L) 22 - 32 mmol/L   Glucose, Bld 130 (H) 70 - 99 mg/dL   BUN 12 6 - 20 mg/dL   Creatinine, Ser 0.90 0.61 - 1.24 mg/dL   Calcium 10.0 8.9 - 10.3 mg/dL   Total Protein 9.0 (H) 6.5 - 8.1 g/dL   Albumin 4.8 3.5 - 5.0 g/dL   AST 76 (H) 15 - 41 U/L   ALT 41 0 - 44 U/L   Alkaline Phosphatase 83 38 - 126 U/L   Total Bilirubin 1.1 0.3 - 1.2 mg/dL   GFR calc non Af Amer >60 >60 mL/min   GFR calc Af Amer >60 >60 mL/min   Anion gap 15 5 - 15  CBC     Status: Abnormal   Collection Time: 08/13/18 10:33 PM  Result Value Ref Range   WBC 8.7 4.0 - 10.5 K/uL   RBC 4.23 4.22 - 5.81 MIL/uL   Hemoglobin 12.8 (L) 13.0 - 17.0 g/dL   HCT 41.4 39.0 - 52.0 %   MCV 97.9 80.0 - 100.0 fL   MCH 30.3 26.0 - 34.0 pg   MCHC 30.9 30.0 - 36.0 g/dL   RDW 16.3 (H) 11.5 - 15.5 %   Platelets 198 150 - 400 K/uL   nRBC 0.0 0.0 - 0.2 %  Differential     Status: None   Collection Time: 08/13/18 10:33 PM  Result Value Ref Range   Neutrophils Relative % 82 %   Neutro Abs 7.1 1.7 - 7.7 K/uL   Lymphocytes Relative 8 %   Lymphs Abs 0.7 0.7 - 4.0 K/uL   Monocytes Relative 9 %   Monocytes Absolute 0.8 0.1 - 1.0 K/uL   Eosinophils Relative 1 %   Eosinophils Absolute 0.1 0.0 - 0.5 K/uL   Basophils Relative 0 %   Basophils Absolute 0.0 0.0 - 0.1 K/uL  I-Stat CG4 Lactic Acid, ED      Status: None   Collection Time: 08/13/18 10:45 PM  Result Value Ref Range  Lactic Acid, Venous 1.01 0.5 - 1.9 mmol/L  Urinalysis, Routine w reflex microscopic     Status: Abnormal   Collection Time: 08/14/18 12:09 AM  Result Value Ref Range   Color, Urine YELLOW YELLOW   APPearance CLEAR CLEAR   Specific Gravity, Urine 1.024 1.005 - 1.030   pH 5.0 5.0 - 8.0   Glucose, UA NEGATIVE NEGATIVE mg/dL   Hgb urine dipstick MODERATE (A) NEGATIVE   Bilirubin Urine NEGATIVE NEGATIVE   Ketones, ur 80 (A) NEGATIVE mg/dL   Protein, ur 100 (A) NEGATIVE mg/dL   Nitrite NEGATIVE NEGATIVE   Leukocytes, UA NEGATIVE NEGATIVE   RBC / HPF 0-5 0 - 5 RBC/hpf   WBC, UA 0-5 0 - 5 WBC/hpf   Bacteria, UA NONE SEEN NONE SEEN   Mucus PRESENT    Hyaline Casts, UA PRESENT   Rapid urine drug screen (hospital performed)     Status: Abnormal   Collection Time: 08/14/18 12:09 AM  Result Value Ref Range   Opiates NONE DETECTED NONE DETECTED   Cocaine NONE DETECTED NONE DETECTED   Benzodiazepines POSITIVE (A) NONE DETECTED   Amphetamines NONE DETECTED NONE DETECTED   Tetrahydrocannabinol POSITIVE (A) NONE DETECTED   Barbiturates NONE DETECTED NONE DETECTED  Ethanol     Status: None   Collection Time: 08/14/18 12:15 AM  Result Value Ref Range   Alcohol, Ethyl (B) <10 <10 mg/dL  Blood gas, venous     Status: Abnormal   Collection Time: 08/14/18 12:15 AM  Result Value Ref Range   pH, Ven 7.341 7.250 - 7.430   pCO2, Ven 33.2 (L) 44.0 - 60.0 mmHg   pO2, Ven 47.8 (H) 32.0 - 45.0 mmHg   Bicarbonate 17.5 (L) 20.0 - 28.0 mmol/L   Acid-base deficit 6.9 (H) 0.0 - 2.0 mmol/L   O2 Saturation 81.3 %   Patient temperature 98.6    Collection site VEIN    Drawn by DRAWN BY RN    Sample type VENOUS   I-Stat CG4 Lactic Acid, ED     Status: None   Collection Time: 08/14/18 12:36 AM  Result Value Ref Range   Lactic Acid, Venous 1.16 0.5 - 1.9 mmol/L   Imaging Studies: No results found.  ED COURSE and MDM  Nursing  notes and initial vitals signs, including pulse oximetry, reviewed.  Vitals:   08/13/18 2220 08/13/18 2222 08/13/18 2225 08/14/18 0009  BP:   (!) 152/86 (!) 157/98  Pulse:   96 93  Resp:   14 16  Temp:   98.1 F (36.7 C)   TempSrc:   Oral   SpO2: 100%  100% 98%  Weight:  54.4 kg    Height:  6' (1.829 m)     12:18 AM Lipase consistent with acute pancreatitis.  Patient feeling better after IV fluids and medications.  Will have patient admitted.  PROCEDURES    ED DIAGNOSES     ICD-10-CM   1. Pancreatitis, recurrent K85.90   2. Nausea and vomiting in adult R11.2        Shanon Rosser, MD 08/14/18 [phone]

## 2018-08-13 NOTE — ED Notes (Signed)
Bed: DS89 Expected date:  Expected time:  Means of arrival:  Comments: [place]

## 2018-08-13 NOTE — ED Notes (Signed)
Writer asked pt to provide urine sample, pt stated they had "peed 20 times today" and wasn't able to at this time.  Pt has urinal at bedside.

## 2018-08-13 NOTE — ED Triage Notes (Addendum)
Pt arriving via [facility] from home with abdominal pain x2 days. N/V x2 days. Pt has hx of pancreatitis. A&O x4. Received 4mg  Zofran prior to arrival.

## 2018-08-14 ENCOUNTER — Telehealth: Payer: Self-pay

## 2018-08-14 ENCOUNTER — Telehealth: Payer: Self-pay | Admitting: Family Medicine

## 2018-08-14 DIAGNOSIS — K219 Gastro-esophageal reflux disease without esophagitis: Secondary | ICD-10-CM

## 2018-08-14 DIAGNOSIS — F17200 Nicotine dependence, unspecified, uncomplicated: Secondary | ICD-10-CM

## 2018-08-14 DIAGNOSIS — K319 Disease of stomach and duodenum, unspecified: Secondary | ICD-10-CM

## 2018-08-14 DIAGNOSIS — K859 Acute pancreatitis without necrosis or infection, unspecified: Secondary | ICD-10-CM | POA: Diagnosis present

## 2018-08-14 DIAGNOSIS — I10 Essential (primary) hypertension: Secondary | ICD-10-CM

## 2018-08-14 HISTORY — DX: Acute pancreatitis without necrosis or infection, unspecified: K85.90

## 2018-08-14 HISTORY — DX: Disease of stomach and duodenum, unspecified: K31.9

## 2018-08-14 LAB — BLOOD GAS, VENOUS
ACID-BASE DEFICIT: 6.9 mmol/L — AB (ref 0.0–2.0)
Bicarbonate: 17.5 mmol/L — ABNORMAL LOW (ref 20.0–28.0)
O2 SAT: 81.3 %
PCO2 VEN: 33.2 mmHg — AB (ref 44.0–60.0)
Patient temperature: 98.6
pH, Ven: 7.341 (ref 7.250–7.430)
pO2, Ven: 47.8 mmHg — ABNORMAL HIGH (ref 32.0–45.0)

## 2018-08-14 LAB — I-STAT CG4 LACTIC ACID, ED: Lactic Acid, Venous: 1.16 mmol/L (ref 0.5–1.9)

## 2018-08-14 LAB — URINALYSIS, ROUTINE W REFLEX MICROSCOPIC
Bacteria, UA: NONE SEEN
Bilirubin Urine: NEGATIVE
Glucose, UA: NEGATIVE mg/dL
KETONES UR: 80 mg/dL — AB
Leukocytes, UA: NEGATIVE
Nitrite: NEGATIVE
PH: 5 (ref 5.0–8.0)
Protein, ur: 100 mg/dL — AB
SPECIFIC GRAVITY, URINE: 1.024 (ref 1.005–1.030)

## 2018-08-14 LAB — DIFFERENTIAL
BASOS PCT: 0 %
Basophils Absolute: 0 10*3/uL (ref 0.0–0.1)
Eosinophils Absolute: 0.1 10*3/uL (ref 0.0–0.5)
Eosinophils Relative: 1 %
LYMPHS PCT: 8 %
Lymphs Abs: 0.7 10*3/uL (ref 0.7–4.0)
MONO ABS: 0.8 10*3/uL (ref 0.1–1.0)
Monocytes Relative: 9 %
NEUTROS ABS: 7.1 10*3/uL (ref 1.7–7.7)
Neutrophils Relative %: 82 %

## 2018-08-14 LAB — RAPID URINE DRUG SCREEN, HOSP PERFORMED
AMPHETAMINES: NOT DETECTED
BENZODIAZEPINES: POSITIVE — AB
Barbiturates: NOT DETECTED
COCAINE: NOT DETECTED
OPIATES: NOT DETECTED
TETRAHYDROCANNABINOL: POSITIVE — AB

## 2018-08-14 LAB — ETHANOL: Alcohol, Ethyl (B): <10 mg/dL (ref <10)

## 2018-08-14 MED ORDER — THIAMINE HCL 100 MG/ML IJ SOLN: 100.0000 mg | Freq: Every day | INTRAMUSCULAR | Status: DC

## 2018-08-14 MED ORDER — PANTOPRAZOLE SODIUM 40 MG PO TBEC
40.0000 mg | DELAYED_RELEASE_TABLET | Freq: Every day | ORAL | Status: DC
  Administered 2018-08-14 – 2018-08-15 (×2): 40 mg via ORAL
  Filled 2018-08-14 (×2): qty 1

## 2018-08-14 MED ORDER — IPRATROPIUM-ALBUTEROL 0.5-2.5 (3) MG/3ML IN SOLN
3.0000 mL | Freq: Two times a day (BID) | RESPIRATORY_TRACT | Status: DC
  Administered 2018-08-14 – 2018-08-15 (×2): 3 mL via RESPIRATORY_TRACT
  Filled 2018-08-14 (×2): qty 3

## 2018-08-14 MED ORDER — NICOTINE 14 MG/24HR TD PT24
14.0000 mg | MEDICATED_PATCH | Freq: Every day | TRANSDERMAL | Status: DC
  Administered 2018-08-14 – 2018-08-15 (×2): 14 mg via TRANSDERMAL
  Filled 2018-08-14 (×2): qty 1

## 2018-08-14 MED ORDER — AMLODIPINE BESYLATE 10 MG PO TABS
10.0000 mg | ORAL_TABLET | Freq: Every day | ORAL | Status: DC
  Administered 2018-08-14 – 2018-08-15 (×2): 10 mg via ORAL
  Filled 2018-08-14: qty 2
  Filled 2018-08-14: qty 1

## 2018-08-14 MED ORDER — ADULT MULTIVITAMIN W/MINERALS CH
1.0000 | ORAL_TABLET | Freq: Every day | ORAL | Status: DC
  Administered 2018-08-14 – 2018-08-15 (×2): 1 via ORAL
  Filled 2018-08-14 (×2): qty 1

## 2018-08-14 MED ORDER — VITAMIN B-1 100 MG PO TABS
100.0000 mg | ORAL_TABLET | Freq: Every day | ORAL | Status: DC
  Administered 2018-08-14 – 2018-08-15 (×2): 100 mg via ORAL
  Filled 2018-08-14 (×2): qty 1

## 2018-08-14 MED ORDER — MORPHINE SULFATE (PF) 2 MG/ML IV SOLN
2.0000 mg | INTRAVENOUS | Status: DC | PRN
  Administered 2018-08-14 – 2018-08-15 (×4): 2 mg via INTRAVENOUS
  Filled 2018-08-14 (×5): qty 1

## 2018-08-14 MED ORDER — ONDANSETRON HCL 4 MG/2ML IJ SOLN: 4.0000 mg | Freq: Four times a day (QID) | INTRAMUSCULAR | Status: DC | PRN

## 2018-08-14 MED ORDER — SODIUM CHLORIDE 0.9 % IV SOLN
INTRAVENOUS | Status: DC
  Administered 2018-08-14 – 2018-08-15 (×4): via INTRAVENOUS

## 2018-08-14 MED ORDER — IPRATROPIUM-ALBUTEROL 0.5-2.5 (3) MG/3ML IN SOLN: 3.0000 mL | Freq: Four times a day (QID) | RESPIRATORY_TRACT | Status: DC | PRN

## 2018-08-14 MED ORDER — FENTANYL CITRATE (PF) 100 MCG/2ML IJ SOLN
100.0000 ug | INTRAMUSCULAR | Status: DC | PRN
  Administered 2018-08-14 (×2): 100 ug via INTRAVENOUS
  Filled 2018-08-14 (×2): qty 2

## 2018-08-14 MED ORDER — LORAZEPAM 2 MG/ML IJ SOLN
1.0000 mg | Freq: Four times a day (QID) | INTRAMUSCULAR | Status: DC | PRN
  Administered 2018-08-14 – 2018-08-15 (×4): 1 mg via INTRAVENOUS
  Filled 2018-08-14 (×4): qty 1

## 2018-08-14 MED ORDER — LORAZEPAM 1 MG PO TABS: 1.0000 mg | ORAL_TABLET | Freq: Four times a day (QID) | ORAL | Status: DC | PRN

## 2018-08-14 MED ORDER — FOLIC ACID 1 MG PO TABS
1.0000 mg | ORAL_TABLET | Freq: Every day | ORAL | Status: DC
  Administered 2018-08-14 – 2018-08-15 (×2): 1 mg via ORAL
  Filled 2018-08-14 (×2): qty 1

## 2018-08-14 MED ORDER — IPRATROPIUM-ALBUTEROL 0.5-2.5 (3) MG/3ML IN SOLN
3.0000 mL | Freq: Four times a day (QID) | RESPIRATORY_TRACT | Status: DC
  Administered 2018-08-14: 3 mL via RESPIRATORY_TRACT
  Filled 2018-08-14: qty 3

## 2018-08-14 MED ORDER — ENOXAPARIN SODIUM 40 MG/0.4ML ~~LOC~~ SOLN: 40.0000 mg | SUBCUTANEOUS | Status: DC

## 2018-08-14 MED ORDER — LIP MEDEX EX OINT
TOPICAL_OINTMENT | CUTANEOUS | Status: AC
  Administered 2018-08-14: 15:00:00
  Filled 2018-08-14: qty 7

## 2018-08-14 NOTE — Progress Notes (Signed)
Report received from ED 

## 2018-08-14 NOTE — ED Notes (Signed)
Pt requesting pain meds. Lovena Le, RN notified.

## 2018-08-14 NOTE — Telephone Encounter (Signed)
Patient's (girlfriend) called to see if she could speak with you. Patient is at the hospital and in critical condition. Due to no DPR on file I am not able to cancel any appointments with her. Please follow up.

## 2018-08-14 NOTE — H&P (Signed)
History and Physical    Richard [name] GHW:[id number] DOB: 06/23/1966 DOA: 08/13/2018  PCP: Antony Blackbird, MD   Patient coming from: Home   Chief Complaint: Abdominal pain, nausea, vomiting  HPI: Richard [name] is a 52 y.o. male with medical history significant of chronic alcohol abuse, history of alcoholic pancreatitis, nicotine abuse, hypertension, GERD, colon cancer who presents to the emergency department today from home with complaints of nausea, vomiting and abdominal pain.  He reports that he has been having the symptoms since last Saturday.  He describes the pain as severe rating 9/10 , sharp in nature, in the epigastric region.  Patient was vomiting and was unable to tolerate anything by mouth.  His pain was worse with movement.  He feels very dehydrated and weak.  He has also been bothered by cough and acid reflux.  Patient reports drinking half pint of vodka every day for last several years.  He also reported smoking a pack a day for last several years.  His UDS was also positive for benzos and THC. Patient seen and examined the bedside in the emergency department.  He was mildly hypertensive otherwise hemodynamically stable.  Found to be weak and cachectic.  He denies any chest pain, shortness of breath, fever, chills, diarrhea, dysuria , headache, hematochezia or melena.  ED Course: Started on IV fluids, pain medication provided.    Past Medical History:  Diagnosis Date  . Acid reflux   . Colon cancer ([place])   . Pancreatitis   . PUD (peptic ulcer disease)     Past Surgical History:  Procedure Laterality Date  . COLON RESECTION       reports that he has been smoking. He has been smoking about 1.00 pack per day. He has never used smokeless tobacco. He reports that he drinks about 2.0 standard drinks of alcohol per week. He reports that he does not use drugs.  Allergies  Allergen Reactions  . Aspirin Other (See Comments)    Acid reflux   . Penicillins Hives    Has patient  had a PCN reaction causing immediate rash, facial/tongue/throat swelling, SOB or lightheadedness with hypotension: yes Has patient had a PCN reaction causing severe rash involving mucus membranes or skin necrosis: no Has patient had a PCN reaction that required hospitalization: no Has patient had a PCN reaction occurring within the last 10 years: no If all of the above answers are "NO", then may proceed with Cephalosporin use.     Family History  Problem Relation Age of Onset  . Colon cancer Father   . Cancer Sister   . CAD Neg Hx   . Stroke Neg Hx   . Diabetes Neg Hx      Prior to Admission medications   Medication Sig Start Date End Date Taking? Authorizing Provider  folic acid (FOLVITE) 1 MG tablet Take 1 tablet (1 mg total) by mouth daily. 07/20/18  Yes Fulp, Cammie, MD  amLODipine (NORVASC) 10 MG tablet Take 1 tablet (10 mg total) by mouth daily. To lower blood pressure 07/20/18 08/19/18  Fulp, Cammie, MD  pantoprazole (PROTONIX) 40 MG tablet Take 1 tablet (40 mg total) by mouth 2 (two) times daily. To reduce stomach acid 07/20/18   Fulp, Cammie, MD  thiamine 100 MG tablet Take 1 tablet (100 mg total) by mouth daily. 07/20/18   Antony Blackbird, MD    Physical Exam: Vitals:   08/14/18 0100 08/14/18 0457 08/14/18 0737 08/14/18 0739  BP: (!) 142/84 140/86 Marland Kitchen)  144/92 (!) 144/92  Pulse: 79 77 76 76  Resp: 17 13  13   Temp:      TempSrc:      SpO2: 98% 100%  99%  Weight:      Height:        Constitutional: Very weak,cachetic Vitals:   08/14/18 0100 08/14/18 0457 08/14/18 0737 08/14/18 0739  BP: (!) 142/84 140/86 (!) 144/92 (!) 144/92  Pulse: 79 77 76 76  Resp: 17 13  13   Temp:      TempSrc:      SpO2: 98% 100%  99%  Weight:      Height:       Eyes: PERRL, lids and conjunctivae normal ENMT: Mucous membranes are dry. Posterior pharynx clear of any exudate or lesions.poor dentition, poor oral hygiene Neck: normal, supple, no masses, no thyromegaly Respiratory: clear to  auscultation bilaterally, no wheezing, no crackles. Normal respiratory effort. No accessory muscle use.  Cardiovascular: Regular rate and rhythm, no murmurs / rubs / gallops. No extremity edema. 2+ pedal pulses. No carotid bruits.  Abdomen: Tenderness in the epigastric region , no masses palpated. No hepatosplenomegaly. Bowel sounds positive.  Musculoskeletal: no clubbing / cyanosis. No joint deformity upper and lower extremities. Good ROM, no contractures. Normal muscle tone.  Skin: no rashes, lesions, ulcers. No induration Neurologic: CN 2-12 grossly intact. Sensation intact, DTR normal. Strength 5/5 in all 4.  Psychiatric: Normal judgment and insight. Alert and oriented x 3. Normal mood.   Foley Catheter:None  Labs on Admission: I have personally reviewed following labs and imaging studies  CBC: Recent Labs  Lab 08/13/18 2233  WBC 8.7  NEUTROABS 7.1  HGB 12.8*  HCT 41.4  MCV 97.9  PLT 545   Basic Metabolic Panel: Recent Labs  Lab 08/13/18 2233  NA 135  K 4.3  CL 106  CO2 14*  GLUCOSE 130*  BUN 12  CREATININE 0.90  CALCIUM 10.0   GFR: Estimated Creatinine Clearance: 75.6 mL/min (by C-G formula based on SCr of 0.9 mg/dL). Liver Function Tests: Recent Labs  Lab 08/13/18 2233  AST 76*  ALT 41  ALKPHOS 83  BILITOT 1.1  PROT 9.0*  ALBUMIN 4.8   Recent Labs  Lab 08/13/18 2233  LIPASE 987*   No results for input(s): AMMONIA in the last 168 hours. Coagulation Profile: No results for input(s): INR, PROTIME in the last 168 hours. Cardiac Enzymes: No results for input(s): CKTOTAL, CKMB, CKMBINDEX, TROPONINI in the last 168 hours. BNP (last 3 results) No results for input(s): PROBNP in the last 8760 hours. HbA1C: No results for input(s): HGBA1C in the last 72 hours. CBG: No results for input(s): GLUCAP in the last 168 hours. Lipid Profile: No results for input(s): CHOL, HDL, LDLCALC, TRIG, CHOLHDL, LDLDIRECT in the last 72 hours. Thyroid Function Tests: No  results for input(s): TSH, T4TOTAL, FREET4, T3FREE, THYROIDAB in the last 72 hours. Anemia Panel: No results for input(s): VITAMINB12, FOLATE, FERRITIN, TIBC, IRON, RETICCTPCT in the last 72 hours. Urine analysis:    Component Value Date/Time   COLORURINE YELLOW 08/14/2018 0009   APPEARANCEUR CLEAR 08/14/2018 0009   LABSPEC 1.024 08/14/2018 0009   PHURINE 5.0 08/14/2018 0009   GLUCOSEU NEGATIVE 08/14/2018 0009   HGBUR MODERATE (A) 08/14/2018 0009   BILIRUBINUR NEGATIVE 08/14/2018 0009   KETONESUR 80 (A) 08/14/2018 0009   PROTEINUR 100 (A) 08/14/2018 0009   UROBILINOGEN 0.2 01/13/2009 1011   NITRITE NEGATIVE 08/14/2018 0009   LEUKOCYTESUR NEGATIVE 08/14/2018 0009  Radiological Exams on Admission: No results found.   Assessment/Plan Principal Problem:   Acute pancreatitis Active Problems:   Intractable nausea and vomiting   Alcohol abuse   Protein-calorie malnutrition, severe   Smoker   GERD (gastroesophageal reflux disease)   HTN (hypertension)  Acute pancreatitis: Presented with epigastric pain, nausea and vomiting.  Lipase elevated at 900s, mild elevation of AST. This is alcoholic pancreatitis.  He has history of same in the past.  Continue pain medication, IV fluids.  We will keep him n.p.o. for now.  Nausea/vomiting: Continue Zofran, IV fluids  Chronic alcohol abuse: Has been drinking since last several years.  Reports that he is half point of vodka daily.  Counseled for alcohol cessation.  Last drink was Friday.  Risks of withdrawal.  He says he gets tremors in the morning.  Continue to monitor for alcohol withdrawal.  Follow CIWA protocol.  Counseled for alcohol cessation.  Will request for social worker consultation.  Continue thiamine and folic acid.  Smoker: Has been smoking for last several years.  Smokes a pack a day.  Counseled for cessation.  Will order nicotine patch. He says he was having cough.  Will order bronchodilators.  No wheezes auscultated.  No  documented history of COPD, but he might have underlying COPD.  GERD: Continue Protonix  Hypertension: Not taking medication but was reported to be on amlodipine.  Restarted amlodipine.  Continue to monitor blood pressure.  Severe protein energy malnutrition: From alcohol dependence.  Will request for nutrition services..  Severity of Illness: The appropriate patient status for this patient is OBSERVATION.     DVT prophylaxis: Lovenox Code Status: Full Family Communication: None present at the bedside Consults called: None     Shelly Coss MD [facility] Pager [phone]  If 7PM-7AM, please contact night-coverage [email or web address] Password [facility]  08/14/2018, 7:53 AM

## 2018-08-14 NOTE — ED Notes (Signed)
ED TO INPATIENT HANDOFF REPORT  Name/Age/Gender Richard [name] 52 y.o. male  Code Status    Code Status Orders  (From admission, onward)         Start     Ordered   08/14/18 0752  Full code  Continuous     08/14/18 0752        Code Status History    Date Active Date Inactive Code Status Order ID Comments User Context   06/27/2018 2249 06/28/2018 2151 Full Code [id number]  [name], MD ED   05/01/2018 2359 05/04/2018 1955 Full Code [id number]  Tomma Rakers, MD ED   11/13/2017 0228 11/15/2017 1102 Full Code [id number]  Etta Quill, DO ED   08/21/2016 0200 08/27/2016 1613 Full Code [id number]  Toy Baker, MD Inpatient   08/20/2016 2323 08/21/2016 0200 Full Code [id number]  [facility]      Home/SNF/Other Home  Chief Complaint Abdominal Pain  Level of Care/Admitting Diagnosis ED Disposition    ED Disposition Condition Comment   [place] [facility]: [facility] [[id number]]  Level of Care: Med-Surg [16]  Diagnosis: Acute pancreatitis [577.0.ICD-9-CM]  Admitting Physician: Shelly Coss [[id number]]  Attending Physician: Shelly Coss [[id number]]  PT Class (Do Not Modify): Observation [104]  PT Acc Code (Do Not Modify): Observation [10022]       Medical History Past Medical History:  Diagnosis Date  . Acid reflux   . Colon cancer ([place])   . Pancreatitis   . PUD (peptic ulcer disease)     Allergies Allergies  Allergen Reactions  . Aspirin Other (See Comments)    Acid reflux   . Penicillins Hives    Has patient had a PCN reaction causing immediate rash, facial/tongue/throat swelling, SOB or lightheadedness with hypotension: yes Has patient had a PCN reaction causing severe rash involving mucus membranes or skin necrosis: no Has patient had a PCN reaction that required hospitalization: no Has patient had a PCN reaction occurring within the last 10 years: no If all of the above answers are "NO", then may  proceed with Cephalosporin use.     IV Location/Drains/Wounds Patient Lines/Drains/Airways Status   Active Line/Drains/Airways    Name:   Placement date:   Placement time:   Site:   Days:   Peripheral IV 08/13/18 Left Antecubital   08/13/18    2200    Antecubital   1   Peripheral IV 08/14/18 Left Forearm   08/14/18    0028    Forearm   less than 1          Labs/Imaging Results for orders placed or performed during the hospital encounter of 08/13/18 (from the past 48 hour(s))  Lipase, blood     Status: Abnormal   Collection Time: 08/13/18 10:33 PM  Result Value Ref Range   Lipase 987 (H) 11 - 51 U/L    Comment: RESULTS CONFIRMED BY MANUAL DILUTION Performed at [facility], [place] [address], [place], [place] [address]   Comprehensive metabolic panel     Status: Abnormal   Collection Time: 08/13/18 10:33 PM  Result Value Ref Range   Sodium 135 135 - 145 mmol/L   Potassium 4.3 3.5 - 5.1 mmol/L   Chloride 106 98 - 111 mmol/L   CO2 14 (L) 22 - 32 mmol/L   Glucose, Bld 130 (H) 70 - 99 mg/dL   BUN 12 6 - 20 mg/dL   Creatinine, Ser 0.90 0.61 - 1.24 mg/dL  Calcium 10.0 8.9 - 10.3 mg/dL   Total Protein 9.0 (H) 6.5 - 8.1 g/dL   Albumin 4.8 3.5 - 5.0 g/dL   AST 76 (H) 15 - 41 U/L   ALT 41 0 - 44 U/L   Alkaline Phosphatase 83 38 - 126 U/L   Total Bilirubin 1.1 0.3 - 1.2 mg/dL   GFR calc non Af Amer >60 >60 mL/min   GFR calc Af Amer >60 >60 mL/min    Comment: (NOTE) The eGFR has been calculated using the CKD EPI equation. This calculation has not been validated in all clinical situations. eGFR's persistently <60 mL/min signify possible Chronic Kidney Disease.    Anion gap 15 5 - 15    Comment: Performed at [facility], [place] [address] [place], [place] [address]  CBC     Status: Abnormal   Collection Time: 08/13/18 10:33 PM  Result Value Ref Range   WBC 8.7 4.0 - 10.5 K/uL   RBC 4.23 4.22 - 5.81 MIL/uL   Hemoglobin 12.8 (L) 13.0 - 17.0  g/dL   HCT 41.4 39.0 - 52.0 %   MCV 97.9 80.0 - 100.0 fL   MCH 30.3 26.0 - 34.0 pg   MCHC 30.9 30.0 - 36.0 g/dL   RDW 16.3 (H) 11.5 - 15.5 %   Platelets 198 150 - 400 K/uL   nRBC 0.0 0.0 - 0.2 %    Comment: Performed at [facility], [place] [address] [place], [place] [address]  Differential     Status: None   Collection Time: 08/13/18 10:33 PM  Result Value Ref Range   Neutrophils Relative % 82 %   Neutro Abs 7.1 1.7 - 7.7 K/uL   Lymphocytes Relative 8 %   Lymphs Abs 0.7 0.7 - 4.0 K/uL   Monocytes Relative 9 %   Monocytes Absolute 0.8 0.1 - 1.0 K/uL   Eosinophils Relative 1 %   Eosinophils Absolute 0.1 0.0 - 0.5 K/uL   Basophils Relative 0 %   Basophils Absolute 0.0 0.0 - 0.1 K/uL    Comment: Performed at [facility], [place] [address], [place], [place] [address]  I-Stat CG4 Lactic Acid, ED     Status: None   Collection Time: 08/13/18 10:45 PM  Result Value Ref Range   Lactic Acid, Venous 1.01 0.5 - 1.9 mmol/L  Urinalysis, Routine w reflex microscopic     Status: Abnormal   Collection Time: 08/14/18 12:09 AM  Result Value Ref Range   Color, Urine YELLOW YELLOW   APPearance CLEAR CLEAR   Specific Gravity, Urine 1.024 1.005 - 1.030   pH 5.0 5.0 - 8.0   Glucose, UA NEGATIVE NEGATIVE mg/dL   Hgb urine dipstick MODERATE (A) NEGATIVE   Bilirubin Urine NEGATIVE NEGATIVE   Ketones, ur 80 (A) NEGATIVE mg/dL   Protein, ur 100 (A) NEGATIVE mg/dL   Nitrite NEGATIVE NEGATIVE   Leukocytes, UA NEGATIVE NEGATIVE   RBC / HPF 0-5 0 - 5 RBC/hpf   WBC, UA 0-5 0 - 5 WBC/hpf   Bacteria, UA NONE SEEN NONE SEEN   Mucus PRESENT    Hyaline Casts, UA PRESENT     Comment: Performed at [facility], [place] [address] [place], [place] [address]  Rapid urine drug screen (hospital performed)     Status: Abnormal   Collection Time: 08/14/18 12:09 AM  Result Value Ref Range   Opiates NONE DETECTED NONE DETECTED   Cocaine NONE DETECTED NONE DETECTED  Benzodiazepines POSITIVE (A) NONE DETECTED   Amphetamines NONE DETECTED NONE DETECTED   Tetrahydrocannabinol POSITIVE (A) NONE DETECTED   Barbiturates NONE DETECTED NONE DETECTED    Comment: (NOTE) DRUG SCREEN FOR MEDICAL PURPOSES ONLY.  IF CONFIRMATION IS NEEDED FOR ANY PURPOSE, NOTIFY LAB WITHIN 5 DAYS. LOWEST DETECTABLE LIMITS FOR URINE DRUG SCREEN Drug Class                     Cutoff (ng/mL) Amphetamine and metabolites    1000 Barbiturate and metabolites    200 Benzodiazepine                 329 Tricyclics and metabolites     300 Opiates and metabolites        300 Cocaine and metabolites        300 THC                            50 Performed at [facility], [place] [address], [place], [place] [address]   Ethanol     Status: None   Collection Time: 08/14/18 12:15 AM  Result Value Ref Range   Alcohol, Ethyl (B) <10 <10 mg/dL    Comment: (NOTE) Lowest detectable limit for serum alcohol is 10 mg/dL. For medical purposes only. Performed at [facility], [place] [address] [place], [place] [address]   Blood gas, venous     Status: Abnormal   Collection Time: 08/14/18 12:15 AM  Result Value Ref Range   pH, Ven 7.341 7.250 - 7.430   pCO2, Ven 33.2 (L) 44.0 - 60.0 mmHg   pO2, Ven 47.8 (H) 32.0 - 45.0 mmHg   Bicarbonate 17.5 (L) 20.0 - 28.0 mmol/L   Acid-base deficit 6.9 (H) 0.0 - 2.0 mmol/L   O2 Saturation 81.3 %   Patient temperature 98.6    Collection site VEIN    Drawn by DRAWN BY RN    Sample type VENOUS     Comment: Performed at [facility] [address], [place], [place] [address]  I-Stat CG4 Lactic Acid, ED     Status: None   Collection Time: 08/14/18 12:36 AM  Result Value Ref Range   Lactic Acid, Venous 1.16 0.5 - 1.9 mmol/L   No results found. None  Pending Labs Unresulted Labs (From admission, onward)    Start     Ordered   08/15/18 1093  Basic metabolic panel  Tomorrow morning,   R     08/14/18  0752   08/15/18 0500  CBC  Tomorrow morning,   R     08/14/18 0752          Vitals/Pain Today's Vitals   08/14/18 0528 08/14/18 0737 08/14/18 0739 08/14/18 0857  BP:  (!) 144/92 (!) 144/92 (!) 158/99  Pulse:  76 76 85  Resp:   13 15  Temp:      TempSrc:      SpO2:   99% 100%  Weight:      Height:      PainSc: Asleep  5      Isolation Precautions No active isolations  Medications Medications  fentaNYL (SUBLIMAZE) injection 100 mcg (100 mcg Intravenous Given 08/14/18 0900)  LORazepam (ATIVAN) tablet 1 mg (has no administration in time range)    Or  LORazepam (ATIVAN) injection 1 mg (has no administration in time range)  thiamine (VITAMIN B-1) tablet 100 mg (100 mg  Oral Given 08/14/18 0903)    Or  thiamine (B-1) injection 100 mg ( Intravenous See Alternative 39/79/53 6922)  folic acid (FOLVITE) tablet 1 mg (1 mg Oral Given 08/14/18 0902)  multivitamin with minerals tablet 1 tablet (1 tablet Oral Given 08/14/18 0902)  amLODipine (NORVASC) tablet 10 mg (10 mg Oral Given 08/14/18 0906)  pantoprazole (PROTONIX) EC tablet 40 mg (40 mg Oral Given 08/14/18 0902)  enoxaparin (LOVENOX) injection 40 mg (has no administration in time range)  ondansetron (ZOFRAN) injection 4 mg (has no administration in time range)  morphine 2 MG/ML injection 2 mg (has no administration in time range)  ipratropium-albuterol (DUONEB) 0.5-2.5 (3) MG/3ML nebulizer solution 3 mL (3 mLs Nebulization Given 08/14/18 0858)  0.9 %  sodium chloride infusion ( Intravenous New Bag/Given 08/14/18 0900)  nicotine (NICODERM CQ - dosed in mg/24 hours) patch 14 mg (14 mg Transdermal Patch Applied 08/14/18 0906)  famotidine (PEPCID) IVPB 20 mg premix (0 mg Intravenous Stopped 08/14/18 0123)  ondansetron (ZOFRAN) injection 4 mg (4 mg Intravenous Given 08/14/18 0035)  fentaNYL (SUBLIMAZE) injection 100 mcg (100 mcg Intravenous Given 08/14/18 0035)  sodium chloride 0.9 % bolus 1,000 mL (0 mLs Intravenous Stopped 08/14/18  0143)    Mobility walks

## 2018-08-14 NOTE — Telephone Encounter (Signed)
Call placed to patient's friend, Gearldine Bienenstock.  She said the he is in the hospital and she wants to cancel the financial counseling appointment for 08/16/18 but she does not want to cancel his appointment with his PCP next week.

## 2018-08-15 DIAGNOSIS — E43 Unspecified severe protein-calorie malnutrition: Secondary | ICD-10-CM

## 2018-08-15 DIAGNOSIS — K219 Gastro-esophageal reflux disease without esophagitis: Secondary | ICD-10-CM

## 2018-08-15 DIAGNOSIS — F17200 Nicotine dependence, unspecified, uncomplicated: Secondary | ICD-10-CM

## 2018-08-15 DIAGNOSIS — F1010 Alcohol abuse, uncomplicated: Secondary | ICD-10-CM

## 2018-08-15 DIAGNOSIS — R112 Nausea with vomiting, unspecified: Secondary | ICD-10-CM

## 2018-08-15 DIAGNOSIS — I10 Essential (primary) hypertension: Secondary | ICD-10-CM

## 2018-08-15 DIAGNOSIS — K852 Alcohol induced acute pancreatitis without necrosis or infection: Secondary | ICD-10-CM

## 2018-08-15 DIAGNOSIS — K859 Acute pancreatitis without necrosis or infection, unspecified: Principal | ICD-10-CM

## 2018-08-15 LAB — BASIC METABOLIC PANEL
Anion gap: 9 (ref 5–15)
BUN: 6 mg/dL (ref 6–20)
CO2: 22 mmol/L (ref 22–32)
CREATININE: 0.55 mg/dL — AB (ref 0.61–1.24)
Calcium: 8.9 mg/dL (ref 8.9–10.3)
Chloride: 105 mmol/L (ref 98–111)
GFR calc Af Amer: >60 mL/min (ref >60)
Glucose, Bld: 96 mg/dL (ref 70–99)
POTASSIUM: 3.3 mmol/L — AB (ref 3.5–5.1)
Sodium: 136 mmol/L (ref 135–145)

## 2018-08-15 LAB — CBC
HCT: 34.3 % — ABNORMAL LOW (ref 39.0–52.0)
Hemoglobin: 11 g/dL — ABNORMAL LOW (ref 13.0–17.0)
MCH: 31.3 pg (ref 26.0–34.0)
MCHC: 32.1 g/dL (ref 30.0–36.0)
MCV: 97.7 fL (ref 80.0–100.0)
PLATELETS: 125 10*3/uL — AB (ref 150–400)
RBC: 3.51 MIL/uL — AB (ref 4.22–5.81)
RDW: 15.9 % — ABNORMAL HIGH (ref 11.5–15.5)
WBC: 6 10*3/uL (ref 4.0–10.5)
nRBC: 0 % (ref 0.0–0.2)

## 2018-08-15 MED ORDER — LORAZEPAM 2 MG/ML IJ SOLN: 1.0000 mg | INTRAMUSCULAR | Status: DC | PRN

## 2018-08-15 MED ORDER — BOOST / RESOURCE BREEZE PO LIQD CUSTOM: 1.0000 | Freq: Three times a day (TID) | ORAL | Status: DC

## 2018-08-15 MED ORDER — LORAZEPAM 1 MG PO TABS: 1.0000 mg | ORAL_TABLET | ORAL | Status: DC | PRN

## 2018-08-15 MED ORDER — POTASSIUM CHLORIDE CRYS ER 20 MEQ PO TBCR
40.0000 meq | EXTENDED_RELEASE_TABLET | Freq: Once | ORAL | Status: AC
  Administered 2018-08-15: 40 meq via ORAL
  Filled 2018-08-15: qty 2

## 2018-08-15 NOTE — Progress Notes (Signed)
PROGRESS NOTE  ANMOL FLECK EVO:[id number] DOB: [birth year] DOA: 08/13/2018 PCP: Antony Blackbird, MD   LOS: 0 days   Brief narrative: Richard [name] is a 52 y.o. male with medical history significant of chronic alcohol abuse, history of alcoholic pancreatitis, nicotine abuse, hypertension, GERD, colon cancer who presented to the emergency department with complaints of nausea vomiting and abdominal pain.  Patient reports drinking of vodka every day for several years.  In the ED lipase was elevated.  His urine drug screen was also positive for benzos and THC.  Patient was admitted to hospital for acute pancreatitis.  Assessment/Plan:  Principal Problem:   Acute pancreatitis Active Problems:   Intractable nausea and vomiting   Alcohol abuse   Protein-calorie malnutrition, severe   Smoker   GERD (gastroesophageal reflux disease)   HTN (hypertension)  Acute alcoholic pancreatitis.  Will check lipase in a.m.  Patient was intubated will advised to clear liquids today.  We will closely monitor.  Chronic alcohol abuse.  Continue CIWA protocol.  Not in active withdrawal at this time.    Nicotine dependence, cannabis abuse, on nicotine patch.  Patient was counseled about it.  GERD.  Continue Protonix.  Severe protein calorie malnutrition.  Get nutrition consult.  Patient was counseled regarding alcohol abuse.   VTE Prophylaxis: Lovenox  Code Status:  Full code  Family Communication: With the patient's father and mother at bedside  Disposition Plan: Home, in 1 to 2 days   Consultants:  None  Procedures:  None  Antibiotics:  None  Subjective: Denies any nausea vomiting or abdominal pain.  Wants to advance his diet.  No diarrhea.  Objective: Vitals:   08/15/18 0916 08/15/18 1032  BP:  (!) 160/89  Pulse: 77 92  Resp: 16   Temp:  98.3 F (36.8 C)  SpO2: 100% 97%    Intake/Output Summary (Last 24 hours) at 08/15/2018 1253 Last data filed at 08/15/2018 1142 Gross per  24 hour  Intake 1404.29 ml  Output 1600 ml  Net -195.71 ml   Filed Weights   08/13/18 2222  Weight: 54.4 kg   Physical Examination: General exam: Appears calm and comfortable ,Not in distress, thinly built. HEENT:PERRL,Oral mucosa moist Respiratory system: Bilateral equal air entry, normal vesicular breath sounds, no wheezes or crackles  Cardiovascular system: S1 & S2 heard, RRR.  Gastrointestinal system: Abdomen is nondistended, soft and nontender. No organomegaly or masses felt. Normal bowel sounds heard. Central nervous system: Alert and oriented. No focal neurological deficits. Extremities: No edema, no clubbing ,no cyanosis, distal peripheral pulses palpable.  Tremors noted. Skin: No rashes, lesions or ulcers,no icterus ,no pallor MSK: Thinly built.   Data Review: I have personally reviewed the following laboratory data and studies,  CBC: Recent Labs  Lab 08/13/18 2233 08/15/18 0519  WBC 8.7 6.0  NEUTROABS 7.1  --   HGB 12.8* 11.0*  HCT 41.4 34.3*  MCV 97.9 97.7  PLT 198 299*   Basic Metabolic Panel: Recent Labs  Lab 08/13/18 2233 08/15/18 0519  NA 135 136  K 4.3 3.3*  CL 106 105  CO2 14* 22  GLUCOSE 130* 96  BUN 12 6  CREATININE 0.90 0.55*  CALCIUM 10.0 8.9   Liver Function Tests: Recent Labs  Lab 08/13/18 2233  AST 76*  ALT 41  ALKPHOS 83  BILITOT 1.1  PROT 9.0*  ALBUMIN 4.8   Recent Labs  Lab 08/13/18 2233  LIPASE 987*   No results for input(s): AMMONIA in the last  168 hours. Cardiac Enzymes: No results for input(s): CKTOTAL, CKMB, CKMBINDEX, TROPONINI in the last 168 hours. BNP (last 3 results) No results for input(s): BNP in the last 8760 hours.  ProBNP (last 3 results) No results for input(s): PROBNP in the last 8760 hours.  CBG: No results for input(s): GLUCAP in the last 168 hours. No results found for this or any previous visit (from the past 240 hour(s)).   Studies: No results found.  Scheduled Meds: . amLODipine  10 mg  Oral Daily  . enoxaparin (LOVENOX) injection  40 mg Subcutaneous Q24H  . folic acid  1 mg Oral Daily  . multivitamin with minerals  1 tablet Oral Daily  . nicotine  14 mg Transdermal Daily  . pantoprazole  40 mg Oral Daily  . thiamine  100 mg Oral Daily   Or  . thiamine  100 mg Intravenous Daily   Continuous Infusions: . sodium chloride 150 mL/hr at 08/15/18 1047    Time spent: 25 minutes. More than 50% of that time was spent in counseling and/or coordination of care.  Kenecia Barren  [facility] Pager [phone]  If 7PM-7AM, please contact night-coverage at [email or web address], password [facility] 08/15/2018, 12:53 PM

## 2018-08-15 NOTE — Progress Notes (Signed)
Initial Nutrition Assessment  DOCUMENTATION CODES:   Severe malnutrition in context of chronic illness, Underweight  INTERVENTION:   Provide Boost Breeze po TID, each supplement provides 250 kcal and 9 grams of protein  NUTRITION DIAGNOSIS:   Severe Malnutrition related to chronic illness(alcohol abuse) as evidenced by percent weight loss, severe fat depletion, severe muscle depletion.   GOAL:   Patient will meet greater than or equal to 90% of their needs  MONITOR:   PO intake, Supplement acceptance, Labs, Weight trends, I & O's  REASON FOR ASSESSMENT:   Consult Assessment of nutrition requirement/status  ASSESSMENT:   52 y.o. male with medical history significant of chronic alcohol abuse, history of alcoholic pancreatitis, nicotine abuse, hypertension, GERD, colon cancer who presented to the emergency department with complaints of nausea vomiting and abdominal pain.  Patient reports drinking of vodka every day for several years.Patient was admitted to hospital for acute pancreatitis.   Patient currently on clear liquids, consuming 100% of trays. PTA pt was drinking 1/2 pint of vodka daily. Had N/V since 11/16, was unable to tolerate any PO during this time.  Pt would benefit from protein supplements, will order Boost Breeze.  Per weight records, pt has los 18 lb since 9/25 (13% wt loss x 1.5 months, significant for time frame).  Medications: Folic acid tablet daily, Multivitamin with minerals daily, K-DUR tablet once,  Thiamine tablet daily Labs reviewed:  Low K  NUTRITION - FOCUSED PHYSICAL EXAM:    Most Recent Value  Orbital Region  Mild depletion  Upper Arm Region  Severe depletion  Thoracic and Lumbar Region  Unable to assess  Buccal Region  Mild depletion  Temple Region  Moderate depletion  Clavicle Bone Region  Severe depletion  Clavicle and Acromion Bone Region  Severe depletion  Scapular Bone Region  Unable to assess  Dorsal Hand  Moderate depletion   Patellar Region  Moderate depletion  Anterior Thigh Region  Unable to assess  Posterior Calf Region  Moderate depletion  Edema (RD Assessment)  None       Diet Order:   Diet Order            Diet clear liquid Room service appropriate? Yes; Fluid consistency: Thin  Diet effective now              EDUCATION NEEDS:   No education needs have been identified at this time  Skin:  Skin Assessment: Reviewed RN Assessment  Last BM:  11/10  Height:   Ht Readings from Last 1 Encounters:  08/14/18 6' (1.829 m)    Weight:   Wt Readings from Last 1 Encounters:  08/13/18 54.4 kg    Ideal Body Weight:  80.9 kg  BMI:  Body mass index is 16.27 kg/m.  Estimated Nutritional Needs:   Kcal:  2200-2400  Protein:  120-130g  Fluid:  2.2L/day   Clayton Bibles, MS, RD, LDN [place] [facility] Pager: [phone] After Hours Pager: [phone]

## 2018-08-16 ENCOUNTER — Ambulatory Visit: Payer: Self-pay

## 2018-08-17 ENCOUNTER — Telehealth (INDEPENDENT_AMBULATORY_CARE_PROVIDER_SITE_OTHER): Payer: Self-pay

## 2018-08-17 NOTE — Telephone Encounter (Signed)
-----   Message from Antony Blackbird, MD sent at 07/25/2018  1:11 PM EDT ----- Please contact patient and asked that he have his blood work done from his recent visit.  It appears that patient left without having his blood work ----- Message ----- From: SYSTEM Sent: 07/25/2018  12:09 AM EDT To: Antony Blackbird, MD

## 2018-08-17 NOTE — Telephone Encounter (Signed)
Called patient but voicemail is full. Nat Christen, CMA

## 2018-08-21 ENCOUNTER — Encounter: Payer: Self-pay | Admitting: Family Medicine

## 2018-08-21 ENCOUNTER — Ambulatory Visit: Payer: Self-pay | Attending: Family Medicine | Admitting: Family Medicine

## 2018-08-21 ENCOUNTER — Ambulatory Visit: Payer: Self-pay | Attending: Family Medicine | Admitting: Licensed Clinical Social Worker

## 2018-08-21 VITALS — BP 130/77 | HR 77 | Temp 98.3°F | Resp 18 | Ht 72.0 in | Wt 128.0 lb

## 2018-08-21 DIAGNOSIS — K219 Gastro-esophageal reflux disease without esophagitis: Secondary | ICD-10-CM | POA: Insufficient documentation

## 2018-08-21 DIAGNOSIS — F17210 Nicotine dependence, cigarettes, uncomplicated: Secondary | ICD-10-CM | POA: Insufficient documentation

## 2018-08-21 DIAGNOSIS — J209 Acute bronchitis, unspecified: Secondary | ICD-10-CM

## 2018-08-21 DIAGNOSIS — F331 Major depressive disorder, recurrent, moderate: Secondary | ICD-10-CM

## 2018-08-21 DIAGNOSIS — F10288 Alcohol dependence with other alcohol-induced disorder: Secondary | ICD-10-CM

## 2018-08-21 DIAGNOSIS — F1010 Alcohol abuse, uncomplicated: Secondary | ICD-10-CM

## 2018-08-21 DIAGNOSIS — I10 Essential (primary) hypertension: Secondary | ICD-10-CM | POA: Insufficient documentation

## 2018-08-21 DIAGNOSIS — K859 Acute pancreatitis without necrosis or infection, unspecified: Secondary | ICD-10-CM | POA: Insufficient documentation

## 2018-08-21 DIAGNOSIS — Z886 Allergy status to analgesic agent status: Secondary | ICD-10-CM | POA: Insufficient documentation

## 2018-08-21 DIAGNOSIS — Z8711 Personal history of peptic ulcer disease: Secondary | ICD-10-CM | POA: Insufficient documentation

## 2018-08-21 DIAGNOSIS — E43 Unspecified severe protein-calorie malnutrition: Secondary | ICD-10-CM

## 2018-08-21 DIAGNOSIS — F419 Anxiety disorder, unspecified: Secondary | ICD-10-CM

## 2018-08-21 DIAGNOSIS — Z880 Allergy status to penicillin: Secondary | ICD-10-CM | POA: Insufficient documentation

## 2018-08-21 DIAGNOSIS — D649 Anemia, unspecified: Secondary | ICD-10-CM

## 2018-08-21 DIAGNOSIS — R110 Nausea: Secondary | ICD-10-CM

## 2018-08-21 DIAGNOSIS — F17200 Nicotine dependence, unspecified, uncomplicated: Secondary | ICD-10-CM

## 2018-08-21 DIAGNOSIS — K860 Alcohol-induced chronic pancreatitis: Secondary | ICD-10-CM

## 2018-08-21 DIAGNOSIS — K2920 Alcoholic gastritis without bleeding: Secondary | ICD-10-CM

## 2018-08-21 DIAGNOSIS — Z85038 Personal history of other malignant neoplasm of large intestine: Secondary | ICD-10-CM | POA: Insufficient documentation

## 2018-08-21 DIAGNOSIS — Z09 Encounter for follow-up examination after completed treatment for conditions other than malignant neoplasm: Secondary | ICD-10-CM

## 2018-08-21 DIAGNOSIS — E876 Hypokalemia: Secondary | ICD-10-CM

## 2018-08-21 MED ORDER — DOXYCYCLINE HYCLATE 100 MG PO TABS: 100.0000 mg | ORAL_TABLET | Freq: Two times a day (BID) | ORAL | 0 refills | Status: DC

## 2018-08-21 MED ORDER — ONDANSETRON HCL 4 MG PO TABS: 4.0000 mg | ORAL_TABLET | Freq: Three times a day (TID) | ORAL | 4 refills | Status: DC | PRN

## 2018-08-21 NOTE — Progress Notes (Signed)
Subjective:    Patient ID: Richard [name], male    DOB: 06-25-1966, 52 y.o.   MRN: [id number]  HPI       52 yo male seen status post recent hospitalization on 08/13/2018-08/15/18 secondary to emergency department visit for complaints of nausea/vomiting and abdominal pain.  Patient with a medical history significant for chronic alcohol abuse, alcoholic pancreatitis, tobacco dependence, hypertension, GERD, hypertension, protein calorie malnutrition, severe and history of colon cancer.  On review of labs from hospital, patient also with anemia with hemoglobin of 11.0 and hypokalemia with potassium of 3.3.  Patient with a lipase of 987 on 08/13/2018.      At today's visit, patient with complaint of continued abdominal pain, chronic nausea but also a few weeks of a recurrent cough with production of brown sputum and patient with sensation of postnasal drainage.  Patient is also felt as if he has chills.  Patient states that at times he is coughing until he throws up.  Patient reports upper mid abdominal pain that is a deep, aching pain and occasional burning and pain is about an 8 on a 0-to-10 scale.  Patient states that the pain was greater than 10 when he was admitted to the hospital.  Patient admits that he continues to drink.  Patient states that he is drinking about a half a pint a day of vodka and previously drank 1 pint per day until he started to slow down on his drinking.  Patient reports that he is interested in eventual complete cessation from alcohol.  Patient does continue to smoke.  Patient admits that his weight loss is partially to the fact that he just does not eat very much.  Patient states that eating increases his abdominal pain and that he really does not have an appetite.  Patient denies any recent issues with blood in the stool and no black/sticky stools.  Patient does have chronic nausea. Past Medical History:  Diagnosis Date  . Acid reflux   . Colon cancer ([place])   . Pancreatitis   .  PUD (peptic ulcer disease)    Past Surgical History:  Procedure Laterality Date  . COLON RESECTION     Family History  Problem Relation Age of Onset  . Colon cancer Father   . Cancer Sister   . CAD Neg Hx   . Stroke Neg Hx   . Diabetes Neg Hx    Social History   Tobacco Use  . Smoking status: Current Every Day Smoker    Packs/day: 1.00  . Smokeless tobacco: Never Used  Substance Use Topics  . Alcohol use: Yes    Alcohol/week: 2.0 standard drinks    Types: 1 Cans of beer, 1 Shots of liquor per week    Comment: heavy alcohol abuse a beer and couple of shots a day for past  14 years  . Drug use: No   Allergies  Allergen Reactions  . Aspirin Other (See Comments)    Acid reflux   . Penicillins Hives    Has patient had a PCN reaction causing immediate rash, facial/tongue/throat swelling, SOB or lightheadedness with hypotension: yes Has patient had a PCN reaction causing severe rash involving mucus membranes or skin necrosis: no Has patient had a PCN reaction that required hospitalization: no Has patient had a PCN reaction occurring within the last 10 years: no If all of the above answers are "NO", then may proceed with Cephalosporin use.      Review of Systems  Constitutional: Positive for appetite change, chills and fatigue. Negative for fever.  HENT: Negative for dental problem, nosebleeds and trouble swallowing.   Respiratory: Positive for cough. Negative for shortness of breath.   Cardiovascular: Negative for chest pain, palpitations and leg swelling.  Gastrointestinal: Positive for abdominal distention, abdominal pain, nausea and vomiting. Negative for blood in stool and constipation.  Genitourinary: Negative for dysuria and frequency.  Musculoskeletal: Positive for back pain and myalgias. Negative for joint swelling.  Neurological: Positive for light-headedness. Negative for headaches.       Objective:   Physical Exam BP 130/77 (BP Location: Right Arm, Patient  Position: Sitting, Cuff Size: Normal)   Pulse 77   Temp 98.3 F (36.8 C) (Oral)   Resp 18   Ht 6' (1.829 m)   Wt 128 lb (58.1 kg)   SpO2 100%   BMI 17.36 kg/m Nurse's notes and vital signs reviewed General- thin framed male who appears slightly older than stated age who appears to be fatigued.  Patient is semireclined on the exam table but able to sit up. ENT- TMs light pink bilaterally, nares with moderate edema/erythema of the nasal turbinates, patient with posterior pharynx erythema/edema and cobblestoning Neck-supple, no lymphadenopathy, no thyromegaly, no carotid bruit Lungs- decreased breath sounds in all lung fields, no increased work of breathing Abdomen- patient is very tender in the epigastric area with voluntary withdrawal with light palpation, patient with hepatomegaly, no rebound on exam Back-no CVA tenderness but patient with some lumbosacral paraspinous spasm and discomfort to palpation-mild Extremities-no edema Psych-patient with a subdued/slightly flattened or blunted affect       Assessment & Plan:  1. Alcohol-induced chronic pancreatitis [facility]) Patient's recent hospital notes were reviewed and discussed with the patient at today's visit.  I discussed with the patient that pancreatitis can be fatal.  Discussed with patient that pancreatic enzymes from irritation/inflammation of the pancreas can get into the bloodstream and cause damage to other organs leading to death.  Patient will have CMP and lipase and follow-up pancreatitis at today's visit and patient is given prescription for Zofran. - Comprehensive metabolic panel - Lipase - ondansetron (ZOFRAN) 4 MG tablet; Take 1 tablet (4 mg total) by mouth every 8 (eight) hours as needed for nausea or vomiting.  Dispense: 30 tablet; Refill: 4  2. Protein-calorie malnutrition, severe Patient with severe protein calorie malnutrition per hospital discharge records and labs.  I discussed the importance of patient with eating  small, bland meals throughout the day to help increase his nutritional status as well as the need to decrease and stop alcohol consumption. - Comprehensive metabolic panel  3. Acute bronchitis, unspecified organism Patient with evidence of acute bronchitis and patient with long-term smoking use so he may also have COPD.  Patient is being placed on doxycycline 100 mg twice daily x10 days and patient should return for evaluation if he has any worsening of symptoms or does not feel that his symptoms are improving on antibiotic treatment. - doxycycline (VIBRA-TABS) 100 MG tablet; Take 1 tablet (100 mg total) by mouth 2 (two) times daily.  Dispense: 20 tablet; Refill: 0  4. Chronic alcoholic gastritis, presence of bleeding unspecified Patient with CT scan done on 06/27/2018 showing diffuse mild to moderate gastric fold thickening suspicious for changes of gastritis.  Discussed with patient that his chronic use of alcohol is the likely cause of his stomach irritation and that this can lead to ulceration.  Alcohol cessation stressed and patient is to continue use of pantoprazole  40 mg twice daily and should continue his GI follow-up  5. Anemia, unspecified type Patient with anemia during hospitalization with hemoglobin of 11 and patient will have repeat CBC at today's visit.  If patient has had any significant drop in hemoglobin, he will be acutely referred to GI and/or asked to go to the emergency department for further evaluation and treatment - CBC with Differential  6. Hypokalemia Patient has had hypokalemia during recent hospitalization and patient will be sent for CMP which will check potassium as well as liver enzymes as patient also with hepatomegaly on imaging done during hospitalization and patient with protein calorie malnutrition - Comprehensive metabolic panel  7. Alcohol dependence with other alcohol-induced disorder [facility]) Patient with chronic issues with alcohol dependence and patient has  social work consultation at today's visit to offer resources to help with his alcohol dependence/treatment for alcohol abuse  8. Chronic nausea Patient with complaint of chronic nausea which is likely related to his alcohol use which has caused chronic pancreatitis and gastritis.  Patient is provided with prescription for Zofran and patient is encouraged to try and eat nutritious foods or diet supplements after he has taken the Zofran. - ondansetron (ZOFRAN) 4 MG tablet; Take 1 tablet (4 mg total) by mouth every 8 (eight) hours as needed for nausea or vomiting.  Dispense: 30 tablet; Refill: 4  9. Tobacco dependence Discussed the need for an importance of tobacco cessation at today's visit.  Patient reports that he is not yet ready to consider smoking cessation.  [facility] follow-up Patient's notes were reviewed including labs/imaging and notes from recent hospitalization secondary to chronic pancreatitis.  An After Visit Summary was printed and given to the patient.  Return in about 1 week (around 08/28/2018).

## 2018-08-21 NOTE — Progress Notes (Signed)
   Subjective:    Patient ID: Richard [name], male    DOB: 21-Jul-1966, 52 y.o.   MRN: [id number]  HPI    Review of Systems     Objective:   Physical Exam        Assessment & Plan:

## 2018-08-22 LAB — CBC WITH DIFFERENTIAL/PLATELET
Basophils Absolute: 0.1 x10E3/uL (ref 0.0–0.2)
Basos: 2 %
EOS (ABSOLUTE): 0.1 x10E3/uL (ref 0.0–0.4)
Eos: 2 %
Hematocrit: 32.5 % — ABNORMAL LOW (ref 37.5–51.0)
Hemoglobin: 11.1 g/dL — ABNORMAL LOW (ref 13.0–17.7)
Immature Grans (Abs): 0.1 x10E3/uL (ref 0.0–0.1)
Immature Granulocytes: 1 %
Lymphocytes Absolute: 1 x10E3/uL (ref 0.7–3.1)
Lymphs: 15 %
MCH: 30.8 pg (ref 26.6–33.0)
MCHC: 34.2 g/dL (ref 31.5–35.7)
MCV: 90 fL (ref 79–97)
Monocytes Absolute: 1.2 x10E3/uL — ABNORMAL HIGH (ref 0.1–0.9)
Monocytes: 19 %
Neutrophils Absolute: 3.9 x10E3/uL (ref 1.4–7.0)
Neutrophils: 61 %
Platelets: 381 x10E3/uL (ref 150–450)
RBC: 3.6 x10E6/uL — ABNORMAL LOW (ref 4.14–5.80)
RDW: 14.5 % (ref 12.3–15.4)
WBC: 6.4 x10E3/uL (ref 3.4–10.8)

## 2018-08-22 LAB — COMPREHENSIVE METABOLIC PANEL WITH GFR
ALT: 57 IU/L — ABNORMAL HIGH (ref 0–44)
AST: 117 IU/L — ABNORMAL HIGH (ref 0–40)
Albumin/Globulin Ratio: 1.4 (ref 1.2–2.2)
Albumin: 4.2 g/dL (ref 3.5–5.5)
Alkaline Phosphatase: 85 IU/L (ref 39–117)
BUN/Creatinine Ratio: 9 (ref 9–20)
BUN: 6 mg/dL (ref 6–24)
Bilirubin Total: 0.3 mg/dL (ref 0.0–1.2)
CO2: 23 mmol/L (ref 20–29)
Calcium: 9.7 mg/dL (ref 8.7–10.2)
Chloride: 98 mmol/L (ref 96–106)
Creatinine, Ser: 0.69 mg/dL — ABNORMAL LOW (ref 0.76–1.27)
GFR calc Af Amer: 128 mL/min/1.73
GFR calc non Af Amer: 111 mL/min/1.73
Globulin, Total: 2.9 g/dL (ref 1.5–4.5)
Glucose: 80 mg/dL (ref 65–99)
Potassium: 3.4 mmol/L — ABNORMAL LOW (ref 3.5–5.2)
Sodium: 139 mmol/L (ref 134–144)
Total Protein: 7.1 g/dL (ref 6.0–8.5)

## 2018-08-22 LAB — LIPASE: Lipase: 102 U/L — ABNORMAL HIGH (ref 13–78)

## 2018-08-24 ENCOUNTER — Telehealth: Payer: Self-pay | Admitting: *Deleted

## 2018-08-24 NOTE — BH Specialist Note (Signed)
Integrated Behavioral Health Initial Visit  MRN: [id number] Name: Richard [name]  Number of [facility] Clinician visits:: 2/6 Session Start time: 9:45 AM  Session End time: 10:00 AM Total time: 15 minutes  Type of Service: [place] Interpretor:No. Interpretor Name and Language: N/A   SUBJECTIVE: Richard [name] is a 52 y.o. male accompanied by self Patient was referred by Dr. Chapman Fitch for depression and anxiety. Patient reports the following symptoms/concerns: feelings of sadness and worry, racing thoughts, loss of appetite, chronic pain, and substance use (alcohol) Duration of problem: Ongoing; Severity of problem: severe  OBJECTIVE: Mood: Anxious and Affect: Depressed Risk of harm to self or others: No plan to harm self or others Pt scored positive on phq9; however, denied current SI/HI/AVH. Protective factors identified, safety plan discussed, and crisis intervention resources were provided.  LIFE CONTEXT: Family and Social: Pt receives support from family and friends School/Work: Pt has pending disability case. He is uninsured Self-Care: Pt reports daily alcohol use (pint of vodka) to cope with stressors Life Changes: Pt has difficulty managing ongoing medical conditions. He experiences chronic pain and reports substance use hx. Was recently hosopitalized for multiple days and is currently worried about girlfriend who is currently hospitalized, as well.   GOALS ADDRESSED: Patient will: 1. Reduce symptoms of: anxiety and depression 2. Increase knowledge and/or ability of: coping skills and healthy habits  3. Demonstrate ability to: Increase adequate support systems for patient/family and Decrease self-medicating behaviors  INTERVENTIONS: Interventions utilized: Solution-Focused Strategies, Supportive Counseling and Link to [employer]  Standardized Assessments completed: GAD-7 and PHQ 2&9 with  C-SSRS  ASSESSMENT: Patient currently experiencing depression and anxiety triggered by difficulty managing ongoing medical conditions and his girlfriend's recent hospitalization. He experiences chronic pain and reports substance use hx (alcohol) Pt's symptoms consist of feelings of sadness and worry, racing thoughts, loss of appetite, and chronic pain. Pt receives strong support in the community.   Pt scored positive on phq9; however, denied current SI/HI/AVH. Protective factors identified, safety plan discussed, and crisis intervention resources were provided.  Pt shared frustration with obtaining required documentation to complete application for the financial assistance program. LCSWA commended pt on coming to appointment and validated his feelings. Pt's strengths were highlighted and encouragement was provided. Transportation assistance (bus passes) were provided to assist pt with obtaining paperwork from [employer].   PLAN: 1. Follow up with behavioral health clinician on : Pt was encouraged to contact LCSWA if symptoms worsen or fail to improve to schedule behavioral appointments at [facility]. 2. Behavioral recommendations: LCSWA recommends that pt apply healthy coping skills discussed and utilize provided resources. Pt is encouraged to schedule follow up appointment with LCSWA 3. Referral(s): [place] (In Clinic) and [employer] Resources:  Transportation 4. "From scale of 1-10, how likely are you to follow plan?":   Rebekah Chesterfield, LCSW 08/24/18 4:10 PM

## 2018-08-24 NOTE — Telephone Encounter (Signed)
-----   Message from Antony Blackbird, MD sent at 08/22/2018  7:09 PM EST ----- Please notify patient that his CBC shows stable anemia with a hemoglobin of 11.1 with normal range being 13-17.  Notify patient that while his lipase remains elevated above normal at 102 that this level is much improved from a value of 987 on 08/13/2018 when he was hospitalized for acute pancreatitis.  Patient is complete metabolic panel showed a mild decrease in potassium at 3.4.  Patient may wish to eat bananas or drink orange juice which are high in potassium for about 3 to 4 days to help increase his potassium.  Patient also with elevated liver enzymes with AST of 117 and ALT of 57.  Patient should make efforts to decrease and eventually stop alcohol use.

## 2018-08-24 NOTE — Telephone Encounter (Signed)
MA unable to reach patient and VM was full.

## 2018-08-28 NOTE — Discharge Summary (Signed)
Physician Discharge Summary  SHOURYA MACPHERSON ZOX:[id number] DOB: [birth year] DOA: 08/13/2018  PCP: Antony Blackbird, MD  Admit date: 08/13/2018  Discharge date: 08/15/2018  Admitted From: Home  Disposition:  AMA  Discharge Condition: Left AMA  CODE STATUS:  Full   Brief/Interim Summary: Casper Harrison Criteis a 52 y.o.malewith medical history significant ofchronic alcohol abuse, history of alcoholic pancreatitis, nicotine abuse, hypertension, GERD, colon cancer who presented to the emergency department with complaints of nausea, vomiting and abdominal pain.  Patient reported drinking of vodka every day for several years.  In the ED lipase was elevated.  His urine drug screen was also positive for benzos and THC.  Patient was admitted to hospital for acute pancreatitis. He was treated conservatively but decided to leave AMA on 08/15/2018. He did not wish to stay in the hospital or get discharge instructions despite knowing the risk of pancreatitis. He was able to make his own medical decisions.   Discharge Diagnoses:  Principal Problem:   Acute pancreatitis Active Problems:   Intractable nausea and vomiting   Alcohol abuse   Protein-calorie malnutrition, severe   Smoker   GERD (gastroesophageal reflux disease)   HTN (hypertension)   Discharge Instructions Left AMA   Follow-up Information    Fulp, Cammie, MD Follow up.   Specialty:  Family Medicine Contact information: [address] [place] [address] [phone]          Allergies  Allergen Reactions  . Aspirin Other (See Comments)    Acid reflux   . Penicillins Hives    Has patient had a PCN reaction causing immediate rash, facial/tongue/throat swelling, SOB or lightheadedness with hypotension: yes Has patient had a PCN reaction causing severe rash involving mucus membranes or skin necrosis: no Has patient had a PCN reaction that required hospitalization: no Has patient had a PCN reaction occurring within the  last 10 years: no If all of the above answers are "NO", then may proceed with Cephalosporin use.     Consultations:  None   Procedures/Studies:  No results found.    The results of significant diagnostics from this hospitalization (including imaging, microbiology, ancillary and laboratory) are listed below for reference.    Microbiology: No results found for this or any previous visit (from the past 240 hour(s)).   Labs: BNP (last 3 results) No results for input(s): BNP in the last 8760 hours. Basic Metabolic Panel: No results for input(s): NA, K, CL, CO2, GLUCOSE, BUN, CREATININE, CALCIUM, MG, PHOS in the last 168 hours. Liver Function Tests: No results for input(s): AST, ALT, ALKPHOS, BILITOT, PROT, ALBUMIN in the last 168 hours. No results for input(s): LIPASE, AMYLASE in the last 168 hours. No results for input(s): AMMONIA in the last 168 hours. CBC: No results for input(s): WBC, NEUTROABS, HGB, HCT, MCV, PLT in the last 168 hours. Cardiac Enzymes: No results for input(s): CKTOTAL, CKMB, CKMBINDEX, TROPONINI in the last 168 hours. BNP: Invalid input(s): POCBNP CBG: No results for input(s): GLUCAP in the last 168 hours. D-Dimer No results for input(s): DDIMER in the last 72 hours. Hgb A1c No results for input(s): HGBA1C in the last 72 hours. Lipid Profile No results for input(s): CHOL, HDL, LDLCALC, TRIG, CHOLHDL, LDLDIRECT in the last 72 hours. Thyroid function studies No results for input(s): TSH, T4TOTAL, T3FREE, THYROIDAB in the last 72 hours.  Invalid input(s): FREET3 Anemia work up No results for input(s): VITAMINB12, FOLATE, FERRITIN, TIBC, IRON, RETICCTPCT in the last 72 hours. Urinalysis    Component  Value Date/Time   COLORURINE YELLOW 08/14/2018 0009   APPEARANCEUR CLEAR 08/14/2018 0009   LABSPEC 1.024 08/14/2018 0009   PHURINE 5.0 08/14/2018 0009   GLUCOSEU NEGATIVE 08/14/2018 0009   HGBUR MODERATE (A) 08/14/2018 0009   BILIRUBINUR NEGATIVE  08/14/2018 0009   KETONESUR 80 (A) 08/14/2018 0009   PROTEINUR 100 (A) 08/14/2018 0009   UROBILINOGEN 0.2 01/13/2009 1011   NITRITE NEGATIVE 08/14/2018 0009   LEUKOCYTESUR NEGATIVE 08/14/2018 0009   Sepsis Labs Invalid input(s): PROCALCITONIN,  WBC,  LACTICIDVEN Microbiology No results found for this or any previous visit (from the past 240 hour(s)).  Please note: You were cared for by a hospitalist during your hospital stay. Once you are discharged, your primary care physician will handle any further medical issues.    SIGNED:  Flora Lipps, MD  [facility] 08/28/2018, 12:03 PM

## 2018-09-10 ENCOUNTER — Ambulatory Visit: Payer: Self-pay | Admitting: Family Medicine

## 2018-09-28 ENCOUNTER — Ambulatory Visit: Payer: Self-pay | Attending: Family Medicine | Admitting: Family Medicine

## 2018-09-28 ENCOUNTER — Encounter: Payer: Self-pay | Admitting: Family Medicine

## 2018-09-28 VITALS — BP 134/91 | HR 85 | Temp 98.5°F | Resp 18 | Ht 72.0 in | Wt 123.0 lb

## 2018-09-28 DIAGNOSIS — Z800 Family history of malignant neoplasm of digestive organs: Secondary | ICD-10-CM | POA: Insufficient documentation

## 2018-09-28 DIAGNOSIS — G8929 Other chronic pain: Secondary | ICD-10-CM | POA: Insufficient documentation

## 2018-09-28 DIAGNOSIS — M545 Low back pain, unspecified: Secondary | ICD-10-CM

## 2018-09-28 DIAGNOSIS — K219 Gastro-esophageal reflux disease without esophagitis: Secondary | ICD-10-CM

## 2018-09-28 DIAGNOSIS — I10 Essential (primary) hypertension: Secondary | ICD-10-CM

## 2018-09-28 DIAGNOSIS — F10288 Alcohol dependence with other alcohol-induced disorder: Secondary | ICD-10-CM

## 2018-09-28 DIAGNOSIS — Z85038 Personal history of other malignant neoplasm of large intestine: Secondary | ICD-10-CM | POA: Insufficient documentation

## 2018-09-28 DIAGNOSIS — Z880 Allergy status to penicillin: Secondary | ICD-10-CM | POA: Insufficient documentation

## 2018-09-28 DIAGNOSIS — Z886 Allergy status to analgesic agent status: Secondary | ICD-10-CM | POA: Insufficient documentation

## 2018-09-28 DIAGNOSIS — J411 Mucopurulent chronic bronchitis: Secondary | ICD-10-CM

## 2018-09-28 DIAGNOSIS — F17210 Nicotine dependence, cigarettes, uncomplicated: Secondary | ICD-10-CM | POA: Insufficient documentation

## 2018-09-28 DIAGNOSIS — E876 Hypokalemia: Secondary | ICD-10-CM

## 2018-09-28 DIAGNOSIS — R110 Nausea: Secondary | ICD-10-CM

## 2018-09-28 DIAGNOSIS — K860 Alcohol-induced chronic pancreatitis: Secondary | ICD-10-CM

## 2018-09-28 DIAGNOSIS — Z8711 Personal history of peptic ulcer disease: Secondary | ICD-10-CM | POA: Insufficient documentation

## 2018-09-28 DIAGNOSIS — Z79899 Other long term (current) drug therapy: Secondary | ICD-10-CM | POA: Insufficient documentation

## 2018-09-28 MED ORDER — TRAMADOL HCL 50 MG PO TABS: ORAL_TABLET | ORAL | 0 refills | Status: DC

## 2018-09-28 MED ORDER — ONDANSETRON HCL 4 MG PO TABS: 4.0000 mg | ORAL_TABLET | Freq: Three times a day (TID) | ORAL | 4 refills | Status: DC | PRN

## 2018-09-28 MED ORDER — ONDANSETRON 4 MG PO TBDP
4.0000 mg | ORAL_TABLET | Freq: Once | ORAL | Status: AC
  Administered 2018-09-28: 4 mg via ORAL

## 2018-09-28 MED ORDER — PANTOPRAZOLE SODIUM 40 MG PO TBEC: 40.0000 mg | DELAYED_RELEASE_TABLET | Freq: Two times a day (BID) | ORAL | 3 refills | Status: DC

## 2018-09-28 MED ORDER — AMLODIPINE BESYLATE 10 MG PO TABS: 10.0000 mg | ORAL_TABLET | Freq: Every day | ORAL | 6 refills | Status: DC

## 2018-09-28 MED ORDER — DOXYCYCLINE HYCLATE 100 MG PO TABS: 100.0000 mg | ORAL_TABLET | Freq: Two times a day (BID) | ORAL | 0 refills | Status: DC

## 2018-09-28 MED FILL — AMLODIPINE BESYLATE 10 MG T: 10 | 30 days supply | Qty: 30 | Fill #0

## 2018-09-28 MED FILL — PANTOPRAZOLE SOD DR 40 MG T: 40 | 30 days supply | Qty: 60 | Fill #0

## 2018-09-28 MED FILL — DOXYCYCLINE HYCLATE 100 MG: 100 | 10 days supply | Qty: 20 | Fill #0

## 2018-09-28 MED FILL — ONDANSETRON HCL 4 MG TABLET: 4 | 10 days supply | Qty: 30 | Fill #0

## 2018-09-28 MED FILL — traMADol HCL 50 MG TABS: 50 | 5 days supply | Qty: 40 | Fill #0

## 2018-09-28 NOTE — Progress Notes (Signed)
Subjective:    Patient ID: Richard [name], male    DOB: 1965/12/18, 52 y.o.   MRN: [id number]  HPI       52 yo male seen in follow-up of chronic pancreatitis, Hypertension and GERD. Patient reports that he continues to have chronic abdominal pain and chronic nausea. Patient also continues to have a chronic productive cough. Patient states that he was unable to afford to have the antibiotic that was prescribed at his last visit for his bronchitis. He reports that he is not drinking as much. Last alcohol consumption was on Christmas Day. He does not believe that he is having any withdrawal symptoms. Patient has continued epigastric pain but not as severe as at his las visit. He believes that the pantoprazole is helping but he still occasionally gets a backwash of fluid into his mouth and throat.      Patient reports that he has seen his eye doctor in [place] since his last visit here-he is not sure of his diagnosis but he had a laser treatment that he does not feel helped. Patient is taking his blood pressure medications daily and has had no headaches or dizziness that he thinks is BP related. Patient has felt light-headed at times, including this morning as he has not yet eaten and is feeling nauseous.  Patient also saw a disability doctor and was sent to have imaging which he believes was of his right foot which he fractured in the past and in which he continues to issues with chronic pain. He also thinks that his back may have been x-rayed. (Patient did not mention to me during his initial H&P or exam that he had a recent fall this week onto his backside which caused increased pain in his lower back/tailone area).  Past Medical History:  Diagnosis Date  . Acid reflux   . Colon cancer ([place])   . Pancreatitis   . PUD (peptic ulcer disease)    Past Surgical History:  Procedure Laterality Date  . COLON RESECTION     Family History  Problem Relation Age of Onset  . Colon cancer Father   .  Cancer Sister   . CAD Neg Hx   . Stroke Neg Hx   . Diabetes Neg Hx    Social History   Tobacco Use  . Smoking status: Current Every Day Smoker    Packs/day: 1.00  . Smokeless tobacco: Never Used  Substance Use Topics  . Alcohol use: Yes    Alcohol/week: 2.0 standard drinks    Types: 1 Cans of beer, 1 Shots of liquor per week    Comment: heavy alcohol abuse a beer and couple of shots a day for past  14 years  . Drug use: No   Allergies  Allergen Reactions  . Aspirin Other (See Comments)    Acid reflux   . Penicillins Hives    Has patient had a PCN reaction causing immediate rash, facial/tongue/throat swelling, SOB or lightheadedness with hypotension: yes Has patient had a PCN reaction causing severe rash involving mucus membranes or skin necrosis: no Has patient had a PCN reaction that required hospitalization: no Has patient had a PCN reaction occurring within the last 10 years: no If all of the above answers are "NO", then may proceed with Cephalosporin use.       Review of Systems  Constitutional: Positive for appetite change and fatigue. Negative for chills and fever.  HENT: Positive for congestion, postnasal drip and rhinorrhea. Negative for  ear pain, sore throat and trouble swallowing.   Eyes: Positive for redness and visual disturbance. Negative for photophobia and pain.  Respiratory: Positive for cough. Negative for shortness of breath.   Cardiovascular: Negative for chest pain, palpitations and leg swelling.  Gastrointestinal: Positive for abdominal pain and nausea. Negative for blood in stool, constipation and diarrhea.  Endocrine: Negative for polydipsia, polyphagia and polyuria.  Genitourinary: Negative for dysuria and frequency.  Musculoskeletal: Positive for back pain, gait problem, joint swelling (right foot) and myalgias.  Neurological: Positive for light-headedness. Negative for dizziness and headaches.  Hematological: Negative for adenopathy. Does not  bruise/bleed easily.       Objective:   Physical Exam BP (!) 134/91 (BP Location: Right Arm, Patient Position: Sitting, Cuff Size: Normal)   Pulse 85   Temp 98.5 F (36.9 C) (Oral)   Resp 18   Ht 6' (1.829 m)   Wt 123 lb (55.8 kg)   SpO2 98%   BMI 16.68 kg/m nurses notes and vital signs reviewed Gen- WNWD but somewhat thin older male in NAD when sitting still but has some difficulty getting up from a chair and getting onto the exam table; occasional mild cough and a nasal quality to his voice ENT-TM's dull, nares with moderate edema and erythema of the nasal turbinates with clear drainage, posterior pharynx and tonsilar arch erythema Neck- supple, no LAD Lungs- mild decreased breath sounds and decreased breath sounds, breathing is not labored and no accessory muscle use CV- RRR ABD- soft, patient with mild epigastric tenderness to palp-no rebound or guarding Back-no CVA tenderness; patient with cervicothoracic scoliosis. Lumbosacral tenderness to palp and tenderness over the lower sacrum/coccyx area EXT- no edema       Assessment & Plan:  1. Alcohol-induced chronic pancreatitis [facility]) Patient with history of alcohol-induced chronic pancreatitis.  Patient reports that he has decreased his alcohol consumption and has not had anything to drink since December 25.  Patient does have less abdominal pain on exam but continues to have nausea.  Patient will have repeat lipase.  Prescription given for refill of Zofran and patient was given a dose of Zofran here in the office. - Lipase - ondansetron (ZOFRAN) 4 MG tablet; Take 1 tablet (4 mg total) by mouth every 8 (eight) hours as needed for nausea or vomiting.  Dispense: 30 tablet; Refill: 4  2. Nausea Patient received a dose of Zofran in the office as patient reported that he had not yet eaten and was having nausea.  Patient also was given some graham crackers which he was able to tolerate without emesis.  Patient will have BMP at today's  visit and follow-up of his issues with recurrent nausea with occasional vomiting and patient with history of hypokalemia.  Refill provided of Zofran. - ondansetron (ZOFRAN-ODT) disintegrating tablet 4 mg - Basic Metabolic Panel  3. Hypokalemia Patient has had issues with recurrent hypokalemia and patient had BMP done at today's visit - Basic Metabolic Panel  4. Essential hypertension Patient with hypertension which is fairly well-controlled on his current amlodipine which he will continue.  Patient will have BMP at today's visit in follow-up of electrolytes/renal function - Basic Metabolic Panel - amLODipine (NORVASC) 10 MG tablet; Take 1 tablet (10 mg total) by mouth daily. To lower blood pressure  Dispense: 30 tablet; Refill: 6  5. Mucopurulent chronic bronchitis ([place]) Patient with mucopurulent chronic bronchitis.  Patient states that he was unable to obtain the antibiotics prescribed at his last visit therefore prescription will be  sent to this pharmacy and as he has not used this pharmacy previously he will be able to obtain today's medication for free - doxycycline (VIBRA-TABS) 100 MG tablet; Take 1 tablet (100 mg total) by mouth 2 (two) times daily.  Dispense: 20 tablet; Refill: 0  6. Acute midline low back pain, unspecified whether sciatica present Patient with a history of chronic back pain but is now status post recent fall with increased pain in the lower sacrum/coccyx area.  X-ray ordered entered for patient to have an x-ray of the sacrum/coccyx to look for any possible fracture and patient provided with prescription for tramadol for acute pain - DG Sacrum/Coccyx; Future - traMADol (ULTRAM) 50 MG tablet; 1-2 pills every 6 hours as needed for pain; eat before taking medication  Dispense: 40 tablet; Refill: 0  7. Chronic nausea Patient with chronic nausea related to his longstanding alcohol use as well as chronic pancreatitis as well as possible gastritis and patient with acid reflux.   Patient is provided with refills of pantoprazole and Zofran  - pantoprazole (PROTONIX) 40 MG tablet; Take 1 tablet (40 mg total) by mouth 2 (two) times daily. To reduce stomach acid  Dispense: 60 tablet; Refill: 3 - ondansetron (ZOFRAN) 4 MG tablet; Take 1 tablet (4 mg total) by mouth every 8 (eight) hours as needed for nausea or vomiting.  Dispense: 30 tablet; Refill: 4  8. Gastroesophageal reflux disease, esophagitis presence not specified Patient provided with refill of pantoprazole for continued treatment of reflux as well as stomach protection as patient has had issues with alcohol abuse/dependence which increases his risk for gastritis/stomach ulcers and GI bleed - pantoprazole (PROTONIX) 40 MG tablet; Take 1 tablet (40 mg total) by mouth 2 (two) times daily. To reduce stomach acid  Dispense: 60 tablet; Refill: 3  9.  Alcohol dependence with other alcohol-induced disorder Patient reports that he has decreased his alcohol intake and states that he has not had alcohol consumption since Christmas day.  Patient denies any withdrawal symptoms.  Patient at today's visit did not have epigastric tenderness as he has had on prior visits due to acute inflammation of his pancreas from alcoholic pancreatitis.  Patient was congratulated on his efforts to decrease alcohol use.  Medical social worker was notified that patient needs to be contacted regarding help with social issues such as food insecurity and patient is in the process of trying to obtain disability. Again mentioned support groups/AA attendance at today's visit.  An After Visit Summary was printed and given to the patient.  Allergies as of 09/28/2018      Reactions   Aspirin Other (See Comments)   Acid reflux    Penicillins Hives   Has patient had a PCN reaction causing immediate rash, facial/tongue/throat swelling, SOB or lightheadedness with hypotension: yes Has patient had a PCN reaction causing severe rash involving mucus membranes or  skin necrosis: no Has patient had a PCN reaction that required hospitalization: no Has patient had a PCN reaction occurring within the last 10 years: no If all of the above answers are "NO", then may proceed with Cephalosporin use.      Medication List       Accurate as of September 28, 2018 10:12 PM. Always use your most recent med list.        amLODipine 10 MG tablet Commonly known as:  NORVASC Take 1 tablet (10 mg total) by mouth daily. To lower blood pressure   doxycycline 100 MG tablet Commonly known as:  VIBRA-TABS Take 1 tablet (100 mg total) by mouth 2 (two) times daily.   folic acid 1 MG tablet Commonly known as:  FOLVITE Take 1 tablet (1 mg total) by mouth daily.   ondansetron 4 MG tablet Commonly known as:  ZOFRAN Take 1 tablet (4 mg total) by mouth every 8 (eight) hours as needed for nausea or vomiting.   pantoprazole 40 MG tablet Commonly known as:  PROTONIX Take 1 tablet (40 mg total) by mouth 2 (two) times daily. To reduce stomach acid   thiamine 100 MG tablet Take 1 tablet (100 mg total) by mouth daily.   traMADol 50 MG tablet Commonly known as:  ULTRAM 1-2 pills every 6 hours as needed for pain; eat before taking medication       Return in about 2 weeks (around 10/12/2018) for chronic issues.

## 2018-09-29 LAB — BASIC METABOLIC PANEL WITH GFR
BUN/Creatinine Ratio: 8 — ABNORMAL LOW (ref 9–20)
BUN: 6 mg/dL (ref 6–24)
CO2: 16 mmol/L — ABNORMAL LOW (ref 20–29)
Calcium: 9.9 mg/dL (ref 8.7–10.2)
Creatinine, Ser: 0.73 mg/dL — ABNORMAL LOW (ref 0.76–1.27)
GFR calc Af Amer: 125 mL/min/1.73
GFR calc non Af Amer: 108 mL/min/1.73
Glucose: 86 mg/dL (ref 65–99)
Potassium: 4.6 mmol/L (ref 3.5–5.2)
Sodium: 151 mmol/L — ABNORMAL HIGH (ref 134–144)

## 2018-09-29 LAB — LIPASE: Lipase: 37 U/L (ref 13–78)

## 2018-09-30 ENCOUNTER — Encounter: Payer: Self-pay | Admitting: Family Medicine

## 2018-10-01 ENCOUNTER — Telehealth: Payer: Self-pay | Admitting: Family Medicine

## 2018-10-01 NOTE — Telephone Encounter (Signed)
Pt needs to come in for repeat lab work.

## 2018-10-02 ENCOUNTER — Telehealth: Payer: Self-pay | Admitting: *Deleted

## 2018-10-02 NOTE — Telephone Encounter (Signed)
-----   Message from Antony Blackbird, MD sent at 09/30/2018 12:00 AM EST ----- Lipase level (pancreatic enzyme) is now normal. Sodium level was high on BMP and needs to be repeated

## 2018-10-02 NOTE — Telephone Encounter (Signed)
Medical Assistant left message on patient's home and cell voicemail. Voicemail states to give a call back to Laron Boorman with [facility] at [phone].  

## 2018-10-04 ENCOUNTER — Telehealth: Payer: Self-pay | Admitting: Family Medicine

## 2018-10-04 NOTE — Telephone Encounter (Signed)
Patient called to get their lab results. Please follow up with patient. °

## 2018-10-09 ENCOUNTER — Telehealth: Payer: Self-pay | Admitting: Licensed Clinical Social Worker

## 2018-10-09 NOTE — Telephone Encounter (Signed)
Medical Assistant left message on patient's home and cell voicemail. Voicemail states to give a call back to [place] with [facility] at [phone]. !!!Please inform patient of Lipase level (pancreatic enzyme) being now normal. Sodium (salt) level was high on BMP and needs to be repeated. Please schedule a lab visit for BMP!!!

## 2018-10-09 NOTE — Telephone Encounter (Signed)
Patient called back to get their results. Please follow up.  °

## 2018-10-09 NOTE — Telephone Encounter (Signed)
LCSWA placed call to patient to follow up on psychosocial and/or behavioral health needs. A message requesting a return call was left.

## 2018-10-12 ENCOUNTER — Ambulatory Visit: Payer: Self-pay | Admitting: Family Medicine

## 2018-10-12 ENCOUNTER — Telehealth: Payer: Self-pay | Admitting: Licensed Clinical Social Worker

## 2018-10-12 NOTE — Telephone Encounter (Signed)
Call placed to patient. LCSWA informed pt of consult from PCP to address recent food stamp reduction. Pt shared that he is interested in Legal Aid referral to assist with appeal.   A completed Legal Aid referral was faxed to Abelino Derrick at [phone]

## 2018-10-15 ENCOUNTER — Ambulatory Visit: Payer: Self-pay | Admitting: Family Medicine

## 2018-11-05 ENCOUNTER — Inpatient Hospital Stay (HOSPITAL_COMMUNITY)
Admission: EM | Admit: 2018-11-05 | Discharge: 2018-11-10 | DRG: 640 | Disposition: A | Discharge status: Home / Self Care | Payer: Self-pay | Attending: Pulmonary Disease | Admitting: Pulmonary Disease

## 2018-11-05 ENCOUNTER — Encounter (HOSPITAL_COMMUNITY): Payer: Self-pay | Admitting: Emergency Medicine

## 2018-11-05 ENCOUNTER — Emergency Department (HOSPITAL_COMMUNITY): Payer: Self-pay

## 2018-11-05 ENCOUNTER — Inpatient Hospital Stay (HOSPITAL_COMMUNITY): Payer: Self-pay

## 2018-11-05 DIAGNOSIS — I110 Hypertensive heart disease with heart failure: Secondary | ICD-10-CM | POA: Diagnosis present

## 2018-11-05 DIAGNOSIS — Z792 Long term (current) use of antibiotics: Secondary | ICD-10-CM

## 2018-11-05 DIAGNOSIS — I471 Supraventricular tachycardia: Secondary | ICD-10-CM | POA: Diagnosis not present

## 2018-11-05 DIAGNOSIS — F1010 Alcohol abuse, uncomplicated: Secondary | ICD-10-CM

## 2018-11-05 DIAGNOSIS — Z85030 Personal history of malignant carcinoid tumor of large intestine: Secondary | ICD-10-CM

## 2018-11-05 DIAGNOSIS — H5500 Unspecified nystagmus: Secondary | ICD-10-CM | POA: Diagnosis present

## 2018-11-05 DIAGNOSIS — I420 Dilated cardiomyopathy: Secondary | ICD-10-CM | POA: Diagnosis present

## 2018-11-05 DIAGNOSIS — E43 Unspecified severe protein-calorie malnutrition: Secondary | ICD-10-CM | POA: Diagnosis present

## 2018-11-05 DIAGNOSIS — Y907 Blood alcohol level of 200-239 mg/100 ml: Secondary | ICD-10-CM | POA: Diagnosis present

## 2018-11-05 DIAGNOSIS — K852 Alcohol induced acute pancreatitis without necrosis or infection: Secondary | ICD-10-CM | POA: Diagnosis present

## 2018-11-05 DIAGNOSIS — Z8711 Personal history of peptic ulcer disease: Secondary | ICD-10-CM

## 2018-11-05 DIAGNOSIS — Z9049 Acquired absence of other specified parts of digestive tract: Secondary | ICD-10-CM

## 2018-11-05 DIAGNOSIS — Z23 Encounter for immunization: Secondary | ICD-10-CM

## 2018-11-05 DIAGNOSIS — R1084 Generalized abdominal pain: Secondary | ICD-10-CM

## 2018-11-05 DIAGNOSIS — Z880 Allergy status to penicillin: Secondary | ICD-10-CM

## 2018-11-05 DIAGNOSIS — F17210 Nicotine dependence, cigarettes, uncomplicated: Secondary | ICD-10-CM | POA: Diagnosis present

## 2018-11-05 DIAGNOSIS — G9341 Metabolic encephalopathy: Secondary | ICD-10-CM | POA: Diagnosis not present

## 2018-11-05 DIAGNOSIS — E860 Dehydration: Secondary | ICD-10-CM | POA: Diagnosis present

## 2018-11-05 DIAGNOSIS — R64 Cachexia: Secondary | ICD-10-CM | POA: Diagnosis present

## 2018-11-05 DIAGNOSIS — Z781 Physical restraint status: Secondary | ICD-10-CM

## 2018-11-05 DIAGNOSIS — R109 Unspecified abdominal pain: Secondary | ICD-10-CM | POA: Diagnosis present

## 2018-11-05 DIAGNOSIS — K219 Gastro-esophageal reflux disease without esophagitis: Secondary | ICD-10-CM | POA: Diagnosis present

## 2018-11-05 DIAGNOSIS — Z79899 Other long term (current) drug therapy: Secondary | ICD-10-CM

## 2018-11-05 DIAGNOSIS — Z800 Family history of malignant neoplasm of digestive organs: Secondary | ICD-10-CM

## 2018-11-05 DIAGNOSIS — E8729 Other acidosis: Secondary | ICD-10-CM

## 2018-11-05 DIAGNOSIS — Z886 Allergy status to analgesic agent status: Secondary | ICD-10-CM

## 2018-11-05 DIAGNOSIS — K860 Alcohol-induced chronic pancreatitis: Secondary | ICD-10-CM | POA: Diagnosis present

## 2018-11-05 DIAGNOSIS — F10931 Alcohol use, unspecified with withdrawal delirium: Secondary | ICD-10-CM

## 2018-11-05 DIAGNOSIS — R05 Cough: Secondary | ICD-10-CM

## 2018-11-05 DIAGNOSIS — Z85038 Personal history of other malignant neoplasm of large intestine: Secondary | ICD-10-CM

## 2018-11-05 DIAGNOSIS — K760 Fatty (change of) liver, not elsewhere classified: Secondary | ICD-10-CM | POA: Diagnosis present

## 2018-11-05 DIAGNOSIS — E876 Hypokalemia: Secondary | ICD-10-CM | POA: Diagnosis present

## 2018-11-05 DIAGNOSIS — R825 Elevated urine levels of drugs, medicaments and biological substances: Secondary | ICD-10-CM

## 2018-11-05 DIAGNOSIS — I700 Atherosclerosis of aorta: Secondary | ICD-10-CM | POA: Diagnosis present

## 2018-11-05 DIAGNOSIS — I10 Essential (primary) hypertension: Secondary | ICD-10-CM

## 2018-11-05 DIAGNOSIS — F10231 Alcohol dependence with withdrawal delirium: Secondary | ICD-10-CM | POA: Diagnosis present

## 2018-11-05 DIAGNOSIS — Z681 Body mass index (BMI) 19 or less, adult: Secondary | ICD-10-CM

## 2018-11-05 DIAGNOSIS — R059 Cough, unspecified: Secondary | ICD-10-CM

## 2018-11-05 DIAGNOSIS — Z598 Other problems related to housing and economic circumstances: Secondary | ICD-10-CM

## 2018-11-05 DIAGNOSIS — F10288 Alcohol dependence with other alcohol-induced disorder: Secondary | ICD-10-CM | POA: Diagnosis present

## 2018-11-05 DIAGNOSIS — E872 Acidosis: Secondary | ICD-10-CM

## 2018-11-05 DIAGNOSIS — I5022 Chronic systolic (congestive) heart failure: Secondary | ICD-10-CM | POA: Diagnosis present

## 2018-11-05 DIAGNOSIS — I959 Hypotension, unspecified: Secondary | ICD-10-CM | POA: Diagnosis not present

## 2018-11-05 DIAGNOSIS — E162 Hypoglycemia, unspecified: Secondary | ICD-10-CM | POA: Diagnosis present

## 2018-11-05 HISTORY — DX: Alcohol abuse, uncomplicated: F10.10

## 2018-11-05 HISTORY — DX: Other acidosis: E87.29

## 2018-11-05 HISTORY — DX: Acidosis: E87.2

## 2018-11-05 HISTORY — DX: Essential (primary) hypertension: I10

## 2018-11-05 LAB — COMPREHENSIVE METABOLIC PANEL
ALK PHOS: 100 U/L (ref 38–126)
ALT: 23 U/L (ref 0–44)
ANION GAP: 26 — AB (ref 5–15)
AST: 81 U/L — ABNORMAL HIGH (ref 15–41)
Albumin: 3.6 g/dL (ref 3.5–5.0)
BILIRUBIN TOTAL: 1.7 mg/dL — AB (ref 0.3–1.2)
BUN: 11 mg/dL (ref 6–20)
CALCIUM: 8.5 mg/dL — AB (ref 8.9–10.3)
CO2: 10 mmol/L — AB (ref 22–32)
CREATININE: 1.14 mg/dL (ref 0.61–1.24)
Chloride: 107 mmol/L (ref 98–111)
Glucose, Bld: 59 mg/dL — ABNORMAL LOW (ref 70–99)
Potassium: 3.9 mmol/L (ref 3.5–5.1)
SODIUM: 143 mmol/L (ref 135–145)
TOTAL PROTEIN: 8.1 g/dL (ref 6.5–8.1)

## 2018-11-05 LAB — CBC WITH DIFFERENTIAL/PLATELET
Abs Immature Granulocytes: 0.16 10*3/uL — ABNORMAL HIGH (ref 0.00–0.07)
BASOS ABS: 0.1 10*3/uL (ref 0.0–0.1)
Basophils Relative: 1 %
EOS ABS: 0 10*3/uL (ref 0.0–0.5)
EOS PCT: 0 %
HEMATOCRIT: 39.9 % (ref 39.0–52.0)
HEMOGLOBIN: 12.6 g/dL — AB (ref 13.0–17.0)
Immature Granulocytes: 1 %
LYMPHS ABS: 1.5 10*3/uL (ref 0.7–4.0)
LYMPHS PCT: 8 %
MCH: 31.1 pg (ref 26.0–34.0)
MCHC: 31.6 g/dL (ref 30.0–36.0)
MCV: 98.5 fL (ref 80.0–100.0)
MONO ABS: 1 10*3/uL (ref 0.1–1.0)
MONOS PCT: 6 %
NRBC: 0 % (ref 0.0–0.2)
Neutro Abs: 14.9 10*3/uL — ABNORMAL HIGH (ref 1.7–7.7)
Neutrophils Relative %: 84 %
Platelets: 232 10*3/uL (ref 150–400)
RBC: 4.05 MIL/uL — ABNORMAL LOW (ref 4.22–5.81)
RDW: 18.6 % — AB (ref 11.5–15.5)
WBC: 17.6 10*3/uL — ABNORMAL HIGH (ref 4.0–10.5)

## 2018-11-05 LAB — BASIC METABOLIC PANEL
Anion gap: 20 — ABNORMAL HIGH (ref 5–15)
Anion gap: 15 (ref 5–15)
BUN: 9 mg/dL (ref 6–20)
BUN: 6 mg/dL (ref 6–20)
CO2: 14 mmol/L — ABNORMAL LOW (ref 22–32)
CO2: 16 mmol/L — ABNORMAL LOW (ref 22–32)
Calcium: 7.9 mg/dL — ABNORMAL LOW (ref 8.9–10.3)
Calcium: 7.8 mg/dL — ABNORMAL LOW (ref 8.9–10.3)
Chloride: 106 mmol/L (ref 98–111)
Chloride: 106 mmol/L (ref 98–111)
Creatinine, Ser: 1 mg/dL (ref 0.61–1.24)
Creatinine, Ser: 0.86 mg/dL (ref 0.61–1.24)
GFR calc Af Amer: >60 mL/min (ref >60)
GFR calc Af Amer: >60 mL/min (ref >60)
GFR calc non Af Amer: >60 mL/min (ref >60)
GFR calc non Af Amer: >60 mL/min (ref >60)
GLUCOSE: 95 mg/dL (ref 70–99)
Glucose, Bld: 85 mg/dL (ref 70–99)
Potassium: 3.7 mmol/L (ref 3.5–5.1)
Potassium: 3.6 mmol/L (ref 3.5–5.1)
Sodium: 140 mmol/L (ref 135–145)
Sodium: 137 mmol/L (ref 135–145)

## 2018-11-05 LAB — CBG MONITORING, ED
GLUCOSE-CAPILLARY: 69 mg/dL — AB (ref 70–99)
Glucose-Capillary: 59 mg/dL — ABNORMAL LOW (ref 70–99)
Glucose-Capillary: 97 mg/dL (ref 70–99)
Glucose-Capillary: 72 mg/dL (ref 70–99)
Glucose-Capillary: 123 mg/dL — ABNORMAL HIGH (ref 70–99)
Glucose-Capillary: 65 mg/dL — ABNORMAL LOW (ref 70–99)
Glucose-Capillary: 76 mg/dL (ref 70–99)
Glucose-Capillary: 118 mg/dL — ABNORMAL HIGH (ref 70–99)
Glucose-Capillary: 93 mg/dL (ref 70–99)
Glucose-Capillary: 129 mg/dL — ABNORMAL HIGH (ref 70–99)

## 2018-11-05 LAB — MAGNESIUM: Magnesium: 1.7 mg/dL (ref 1.7–2.4)

## 2018-11-05 LAB — PHOSPHORUS: Phosphorus: 2.2 mg/dL — ABNORMAL LOW (ref 2.5–4.6)

## 2018-11-05 LAB — ETHANOL: Alcohol, Ethyl (B): 226 mg/dL — ABNORMAL HIGH (ref <10)

## 2018-11-05 LAB — TROPONIN I

## 2018-11-05 LAB — BETA-HYDROXYBUTYRIC ACID: Beta-Hydroxybutyric Acid: >8 mmol/L — ABNORMAL HIGH (ref 0.05–0.27)

## 2018-11-05 LAB — LACTIC ACID, PLASMA
LACTIC ACID, VENOUS: 2.2 mmol/L — AB (ref 0.5–1.9)
Lactic Acid, Venous: 3.1 mmol/L (ref 0.5–1.9)
Lactic Acid, Venous: 1.7 mmol/L (ref 0.5–1.9)

## 2018-11-05 LAB — LIPASE, BLOOD: Lipase: 92 U/L — ABNORMAL HIGH (ref 11–51)

## 2018-11-05 MED ORDER — FOLIC ACID 5 MG/ML IJ SOLN
1.0000 mg | Freq: Every day | INTRAMUSCULAR | Status: DC
  Administered 2018-11-05 – 2018-11-06 (×2): 1 mg via INTRAVENOUS
  Filled 2018-11-05 (×3): qty 0.2

## 2018-11-05 MED ORDER — K PHOS MONO-SOD PHOS DI & MONO 155-852-130 MG PO TABS
500.0000 mg | ORAL_TABLET | ORAL | Status: AC
  Administered 2018-11-05 (×2): 500 mg via ORAL
  Filled 2018-11-05 (×3): qty 2

## 2018-11-05 MED ORDER — DEXTROSE 50 % IV SOLN: 50.0000 mL | Freq: Once | INTRAVENOUS | Status: DC

## 2018-11-05 MED ORDER — SODIUM CHLORIDE 0.9 % IV BOLUS
500.0000 mL | Freq: Once | INTRAVENOUS | Status: AC
  Administered 2018-11-05: 500 mL via INTRAVENOUS

## 2018-11-05 MED ORDER — MORPHINE SULFATE (PF) 2 MG/ML IV SOLN
2.0000 mg | Freq: Once | INTRAVENOUS | Status: AC
  Administered 2018-11-05: 2 mg via INTRAVENOUS
  Filled 2018-11-05: qty 1

## 2018-11-05 MED ORDER — SODIUM CHLORIDE 0.9 % IV BOLUS
1000.0000 mL | Freq: Once | INTRAVENOUS | Status: AC
  Administered 2018-11-05: 1000 mL via INTRAVENOUS

## 2018-11-05 MED ORDER — DICLOFENAC SODIUM 1 % TD GEL
2.0000 g | Freq: Four times a day (QID) | TRANSDERMAL | Status: DC
  Administered 2018-11-05 – 2018-11-10 (×16): 2 g via TOPICAL
  Filled 2018-11-05 (×2): qty 100

## 2018-11-05 MED ORDER — DEXTROSE 50 % IV SOLN
50.0000 mL | INTRAVENOUS | Status: DC | PRN
  Filled 2018-11-05: qty 50

## 2018-11-05 MED ORDER — IOHEXOL 300 MG/ML  SOLN
100.0000 mL | Freq: Once | INTRAMUSCULAR | Status: AC | PRN
  Administered 2018-11-05: 100 mL via INTRAVENOUS

## 2018-11-05 MED ORDER — LORAZEPAM 2 MG/ML IJ SOLN
2.0000 mg | INTRAMUSCULAR | Status: DC | PRN
  Administered 2018-11-05: 2 mg via INTRAVENOUS
  Administered 2018-11-06: 3 mg via INTRAVENOUS
  Administered 2018-11-06 – 2018-11-07 (×3): 2 mg via INTRAVENOUS
  Administered 2018-11-07 (×3): 3 mg via INTRAVENOUS
  Administered 2018-11-07: 2 mg via INTRAVENOUS
  Filled 2018-11-05 (×3): qty 1
  Filled 2018-11-05: qty 2
  Filled 2018-11-05: qty 1
  Filled 2018-11-05 (×3): qty 2
  Filled 2018-11-05: qty 1

## 2018-11-05 MED ORDER — ONDANSETRON HCL 4 MG PO TABS: 4.0000 mg | ORAL_TABLET | Freq: Four times a day (QID) | ORAL | Status: DC | PRN

## 2018-11-05 MED ORDER — MORPHINE SULFATE (PF) 2 MG/ML IV SOLN
2.0000 mg | INTRAVENOUS | Status: DC | PRN
  Administered 2018-11-05 – 2018-11-06 (×6): 2 mg via INTRAVENOUS
  Filled 2018-11-05 (×6): qty 1

## 2018-11-05 MED ORDER — ACETAMINOPHEN 325 MG PO TABS
650.0000 mg | ORAL_TABLET | Freq: Four times a day (QID) | ORAL | Status: DC | PRN
  Administered 2018-11-09: 650 mg via ORAL
  Filled 2018-11-05: qty 2

## 2018-11-05 MED ORDER — THIAMINE HCL 100 MG/ML IJ SOLN
100.0000 mg | Freq: Every day | INTRAMUSCULAR | Status: DC
  Administered 2018-11-06: 100 mg via INTRAVENOUS
  Filled 2018-11-05 (×3): qty 2

## 2018-11-05 MED ORDER — PANTOPRAZOLE SODIUM 40 MG PO TBEC
40.0000 mg | DELAYED_RELEASE_TABLET | Freq: Every day | ORAL | Status: DC
  Administered 2018-11-06 – 2018-11-10 (×5): 40 mg via ORAL
  Filled 2018-11-05 (×5): qty 1

## 2018-11-05 MED ORDER — NICOTINE 21 MG/24HR TD PT24
21.0000 mg | MEDICATED_PATCH | Freq: Every day | TRANSDERMAL | Status: DC
  Administered 2018-11-05 – 2018-11-10 (×6): 21 mg via TRANSDERMAL
  Filled 2018-11-05 (×6): qty 1

## 2018-11-05 MED ORDER — PANTOPRAZOLE SODIUM 40 MG IV SOLR
40.0000 mg | Freq: Once | INTRAVENOUS | Status: AC
  Administered 2018-11-05: 40 mg via INTRAVENOUS
  Filled 2018-11-05: qty 40

## 2018-11-05 MED ORDER — DEXTROSE 50 % IV SOLN
1.0000 | Freq: Once | INTRAVENOUS | Status: AC
  Administered 2018-11-05: 50 mL via INTRAVENOUS
  Filled 2018-11-05: qty 50

## 2018-11-05 MED ORDER — PROMETHAZINE HCL 25 MG/ML IJ SOLN
12.5000 mg | Freq: Four times a day (QID) | INTRAMUSCULAR | Status: DC | PRN
  Administered 2018-11-05 – 2018-11-06 (×2): 12.5 mg via INTRAVENOUS
  Filled 2018-11-05 (×2): qty 1

## 2018-11-05 MED ORDER — ONDANSETRON HCL 4 MG/2ML IJ SOLN: 4.0000 mg | Freq: Four times a day (QID) | INTRAMUSCULAR | Status: DC | PRN

## 2018-11-05 MED ORDER — AMLODIPINE BESYLATE 10 MG PO TABS
10.0000 mg | ORAL_TABLET | Freq: Every day | ORAL | Status: DC
  Administered 2018-11-05 – 2018-11-06 (×2): 10 mg via ORAL
  Filled 2018-11-05 (×2): qty 2
  Filled 2018-11-05: qty 1

## 2018-11-05 MED ORDER — THIAMINE HCL 100 MG/ML IJ SOLN
Freq: Once | INTRAVENOUS | Status: AC
  Administered 2018-11-05: 07:00:00 via INTRAVENOUS
  Filled 2018-11-05: qty 1000

## 2018-11-05 MED ORDER — DEXTROSE-NACL 5-0.9 % IV SOLN
INTRAVENOUS | Status: DC
  Administered 2018-11-05 (×2): 125 mL/h via INTRAVENOUS
  Administered 2018-11-06: 07:00:00 via INTRAVENOUS

## 2018-11-05 MED ORDER — ACETAMINOPHEN 650 MG RE SUPP: 650.0000 mg | Freq: Four times a day (QID) | RECTAL | Status: DC | PRN

## 2018-11-05 MED ORDER — ENOXAPARIN SODIUM 40 MG/0.4ML ~~LOC~~ SOLN
40.0000 mg | SUBCUTANEOUS | Status: DC
  Administered 2018-11-06 – 2018-11-09 (×4): 40 mg via SUBCUTANEOUS
  Filled 2018-11-05 (×7): qty 0.4

## 2018-11-05 MED ORDER — SODIUM PHOSPHATES 45 MMOLE/15ML IV SOLN
10.0000 mmol | Freq: Once | INTRAVENOUS | Status: DC
  Filled 2018-11-05: qty 3.33

## 2018-11-05 NOTE — ED Notes (Signed)
PAGED ADMITTING PER RN  

## 2018-11-05 NOTE — ED Notes (Addendum)
Attempted EKg. Pt c/o nausea. Dr. Jari Favre contacted and request repeat EKG.

## 2018-11-05 NOTE — ED Notes (Signed)
Pt cleaned of urine that spilled from urinal

## 2018-11-05 NOTE — ED Notes (Signed)
DIET TRAY ORDERED FOR PT

## 2018-11-05 NOTE — ED Notes (Signed)
Pt again ask for pain mkeds. Will contact provider

## 2018-11-05 NOTE — H&P (Signed)
Date: 11/05/2018               Patient Name:  Richard [name] MRN: [id number]  DOB: 1965/11/01 Age / Sex: 53 y.o., male   PCP: Richard Blackbird, MD         Medical Service: Internal Medicine Teaching Service         Attending Physician: Dr. Annia Belt, MD    First Contact: Dr. Truman Hayward Pager: [phone]  Second Contact: Dr. Trilby Drummer Pager: [phone]       After Hours (After 5p/  First Contact Pager: [phone]  weekends / holidays): Second Contact Pager: [phone]   Chief Complaint: abdominal pain  History of Present Illness:  Richard [name] is a 53yo male with PMH of EtOH use disorder, HTN, EtOH pancreatitis, GERD, h/o colon cancer s/p resection presenting to [facility] for abdominal pain.  Patient states he has had recurrent abdominal pain for years now, but current episode began 2 days ago. Pain is diffuse and similar in character to prior episodes. He endorses associated weakness (hasn't gotten out of bed since pain began), nausea, vomiting (mucous with only specs of blood and w/o frank hematemesis or coffee ground emesis); he has not eaten or taken much PO liquids in the last couple of days due to weakness and abdominal pain. He endorses associated hot flashes and chills; denies fever as far as he knows, denies change in chronic, productive cough, denies melena or hematochezia; urinary symptoms. He endorses drinking 1 pint of vodka daily with last use 2 days ago; he denies prior seizures but endorses almost daily withdrawal symptoms as well as history of hallucinations with withdrawals.   He endorses ongoing chronic cough worse in the morning, productive of brown sputum. He denies hemoptysis. He has chest pain radiating from his left shoulder.   In the ED, glucose was 59; CMet revealed an anion gap metabolic acidosis with bicarb of 10 and anion gap of 26; he had AST elevation to 81 with normal ALT; total bili was up to 1.7. CBC revealed a leukocytosis to 17.6 with Hgb 12.6, Hct 39.9, plts 232. EtOH  level was 226. Lactic acid was 3.1>2.2. Lipase was 92. Troponin was negative. CT abdomen did not reveal an acute process to explain his pain.  Meds:  Current Meds  Medication Sig  . ondansetron (ZOFRAN) 4 MG tablet Take 1 tablet (4 mg total) by mouth every 8 (eight) hours as needed for nausea or vomiting.  . pantoprazole (PROTONIX) 40 MG tablet Take 1 tablet (40 mg total) by mouth 2 (two) times daily. To reduce stomach acid (Patient taking differently: Take 40 mg by mouth daily. To reduce stomach acid)   Allergies: Allergies as of 11/05/2018 - Review Complete 11/05/2018  Allergen Reaction Noted  . Aspirin Other (See Comments) 08/20/2016  . Penicillins Hives 01/09/2012   Past Medical History:  Diagnosis Date  . Acid reflux   . Colon cancer ([place])   . Pancreatitis   . PUD (peptic ulcer disease)     Family History:  Father who passed with colon cancer Sister - unknown cancer  Social History: smokes 1ppd of cigarettes for decades, drinks 1 pint of vodka daily; last illicit drug use in 5784O - cocaine and marijuana.   Review of Systems: A complete ROS was negative except as per HPI.   Physical Exam: Blood pressure 140/82, pulse 69, temperature 98.9 F (37.2 C), temperature source Rectal, resp. rate 19, SpO2 98 %. GENERAL- alert, co-operative, appears as  stated age, not in any distress. Cachectic. HEENT- Atraumatic, normocephalic, EOMI, oral mucosa appears dry CARDIAC- RRR, no murmurs, rubs or gallops. RESP- no increased work of breathing. Bibasilar rales present. No wheezing. ABDOMEN- Soft, nondistended, vertical midabdomen surgical scar present. Diffuse tenderness to palpation, no masses appreciated. NEURO- No obvious Cr N abnormality. Moving all 4 extremities freely EXTREMITIES- pulse 2+ PT/DP, symmetric, no pedal edema. SKIN- Warm, dry, no rash or lesion. PSYCH- Normal mood and affect, appropriate thought content and speech.  EKG: personally reviewed my interpretation is  sinus rhythm w/o acute ST elevation or TWI  CXR: personally reviewed my interpretation is blunting of bil hemidiaphragms, no infiltrate or consolidation, no mass  CT abd/pel: 1. Status post subtotal colectomy. Anastomosis patent in the left abdomen. No evident bowel obstruction currently. No abscess evident in the abdomen or pelvis. 2.  Hepatic steatosis. 3.  Aortoiliac atherosclerosis. 4.  Mild lower lobe bronchiectatic change.  Assessment & Plan by Problem: Principal Problem:   High anion gap metabolic acidosis Active Problems:   Hepatic steatosis   Alcohol abuse   Alcoholic ketoacidosis   Abdominal pain  Anion gap metabolic acidosis - likely alcoholic ketoacidosis: S/p 1.5L NS and 1L banana bag in ED; he was initially hypoglycemic which improved with dextrose. Lactic acid still slightly elevated to 2.2 after initial boluses. He does have a leukocytosis and reportedly a fever by EMS however has been afebrile and had not received treatment for fever; he denies other symptoms to indicate infection, however does have a chronic productive cough with exam revealing bibasilar crackles. CXR without infiltrate or consolidation. --f/u beta hydroxybutirate --q2hr CBGs for now, bmet q4hr until resolution of anion gap --1L NS bolus again, start D5NS infusion at 141ml/hr --f/u repeat LA  EtOH use disorder: Patient drinks 1 pint of vodka per day and experiences daily withdrawal symptoms until he is able to drink again. He denies prior seizures but does endorse prior hallucinations with withdrawal so high risk. He reports last EtOH use was 2 days ago; EtOH level in ED was 226. He has required precedex drip in 2017 for alcohol withdrawal. He is interested in cessation - we discussed medications to help with this that [facility] can likely help him with (naltrexone). --CIWA stepdown protocol --Thiamine and folate replacement --SW consult for AA resources --Consider starting naltrexone at discharge through  [place]  HTN: Patient with h/o of hypertension. Had been started on amlodipine by his PCP, however has not picked up this medication yet. He is mildly hypertensive today though this could be due to pain. --hold home amlodipine for now; if he stops drinking he may not need this at discharge  GERD: Continue home protonix daily.  Hepatic steatosis: Seen on CT abdomen, likely due to EtOH use. HCV negative 08/2016 and denies recent illicit drug use. --advised alcohol cessation  Tobacco use Chronic cough Shortness of breath with exertion: Patient with decades of 1ppd smoking history now with chronic cough and shortness of breath with exertion. SOB may be from deconditioning as he is cachectic; he has a chronic cough that is most productive in the mornings so may have COPD component as well. CXR w/o infiltrate or consolidation but does have some blunting of hemidiaphragms.. --nicotine patch --consider PFTs outpatient   H/o colon cancer: Patient with h/o invasive adenocarcinoma s/p resection in 2007. Per patient he had repeat colonoscopy in 2014 but no records in our system. He endorses weight loss but denies hematochezia or melena.  Diet: clears IVF: D5NS  153ml/hr VTE ppx: enoxaparin Code: FULL - discussed at admission; wishes consistent with FULL code  Dispo: Admit patient to Inpatient with expected length of stay greater than 2 midnights.  SignedAlphonzo Grieve, MD 11/05/2018, 9:46 AM  [phone]

## 2018-11-05 NOTE — ED Notes (Signed)
Hassan Rowan, RN states most recent CBG 97.

## 2018-11-05 NOTE — ED Notes (Signed)
Admit Doctor at bedside.  

## 2018-11-05 NOTE — ED Notes (Signed)
CBG confirmed and pt given po juice. Taking fluyids well and tolerates po well.

## 2018-11-05 NOTE — ED Triage Notes (Signed)
Per EMS, pt reports weakness, N/V X1 week.  Two days ago he started experiencing left sided chest/shoulder pain.  Per EMS, fever of 101.7.  Had a headache and took motrin earlier, headache is gone.

## 2018-11-05 NOTE — ED Notes (Signed)
Dr. Jari Favre contacted with most recent lactic.

## 2018-11-05 NOTE — ED Notes (Signed)
Dr, Jari Favre contacted with CBG and request for pain meds.

## 2018-11-05 NOTE — ED Provider Notes (Addendum)
[facility] EMERGENCY DEPARTMENT Provider Note   CSN: [id number] Arrival date & time: 11/05/18  0444     History   Chief Complaint Chief Complaint  Patient presents with  . Chest Pain  . Weakness    HPI Richard [name] is a 53 y.o. male.  Patient to ED via EMS for nausea, vomiting and progressive, severe generalized weakness over the past 1 week. He complains of abdominal pain, "I always have abdominal pain". He is reported by EMS to be febrile to 101.7. The patient does not endorse fever at home. He is having left upper chest pain but does not state for how long. He states he is concerned that he has pneumonia as he has been SOB with cough. He is a continuous smoker, daily alcohol drinker, with history of colon CA 2007 s/p surgery in remission, alcoholic gastritis, pancreatitis.   The history is provided by the EMS personnel and the patient. No language interpreter was used.  Chest Pain  Associated symptoms: abdominal pain, cough, fever (by EMS - ), nausea, shortness of breath, vomiting and weakness   Weakness  Associated symptoms: abdominal pain, chest pain, cough, fever (by EMS - ), nausea, shortness of breath and vomiting     Past Medical History:  Diagnosis Date  . Acid reflux   . Colon cancer ([place])   . Pancreatitis   . PUD (peptic ulcer disease)     Patient Active Problem List   Diagnosis Date Noted  . Acute pancreatitis 08/14/2018  . Smoker 08/14/2018  . GERD (gastroesophageal reflux disease) 08/14/2018  . HTN (hypertension) 08/14/2018  . Abdominal pain 06/27/2018  . Hypertensive urgency 06/27/2018  . Elevated troponin 06/27/2018  . Hypoglycemia 06/27/2018  . Protein-calorie malnutrition, severe 05/02/2018  . Pancreatitis 05/01/2018  . Occult blood positive stool 11/13/2017  . Alcoholic ketoacidosis 67/89/3810  . Hepatic steatosis 08/21/2016  . Thrombocytopenia ([place]) 08/21/2016  . Alcohol abuse 08/21/2016  . Alcohol withdrawal ([place])  08/20/2016  . Dehydration 08/20/2016  . Intractable nausea and vomiting 08/20/2016  . Lactic acidosis 08/20/2016  . Aspiration pneumonia ([place]) 08/20/2016  . Sepsis ([place]) 08/20/2016    Past Surgical History:  Procedure Laterality Date  . COLON RESECTION          Home Medications    Prior to Admission medications   Medication Sig Start Date End Date Taking? Authorizing Provider  amLODipine (NORVASC) 10 MG tablet Take 1 tablet (10 mg total) by mouth daily. To lower blood pressure 09/28/18 10/28/18  Fulp, Cammie, MD  doxycycline (VIBRA-TABS) 100 MG tablet Take 1 tablet (100 mg total) by mouth 2 (two) times daily. 09/28/18   Fulp, Cammie, MD  folic acid (FOLVITE) 1 MG tablet Take 1 tablet (1 mg total) by mouth daily. 07/20/18   Fulp, Cammie, MD  ondansetron (ZOFRAN) 4 MG tablet Take 1 tablet (4 mg total) by mouth every 8 (eight) hours as needed for nausea or vomiting. 09/28/18   Fulp, Cammie, MD  pantoprazole (PROTONIX) 40 MG tablet Take 1 tablet (40 mg total) by mouth 2 (two) times daily. To reduce stomach acid 09/28/18   Fulp, Cammie, MD  thiamine 100 MG tablet Take 1 tablet (100 mg total) by mouth daily. 07/20/18   Fulp, Ander Gaster, MD  traMADol (ULTRAM) 50 MG tablet 1-2 pills every 6 hours as needed for pain; eat before taking medication 09/28/18   Antony Blackbird, MD    Family History Family History  Problem Relation Age of Onset  . Colon  cancer Father   . Cancer Sister   . CAD Neg Hx   . Stroke Neg Hx   . Diabetes Neg Hx     Social History Social History   Tobacco Use  . Smoking status: Current Every Day Smoker    Packs/day: 1.00  . Smokeless tobacco: Never Used  Substance Use Topics  . Alcohol use: Yes    Alcohol/week: 2.0 standard drinks    Types: 1 Cans of beer, 1 Shots of liquor per week    Comment: heavy alcohol abuse a beer and couple of shots a day for past  14 years  . Drug use: No     Allergies   Aspirin and Penicillins   Review of Systems Review of  Systems  Constitutional: Positive for fever (by EMS - ).  HENT: Negative for congestion.   Respiratory: Positive for cough and shortness of breath.   Cardiovascular: Positive for chest pain.  Gastrointestinal: Positive for abdominal pain, nausea and vomiting.  Neurological: Positive for weakness.     Physical Exam Updated Vital Signs BP 133/80 (BP Location: Right Arm)   Pulse 76   Temp 98.6 F (37 C) (Oral)   Resp 16   SpO2 100%   Physical Exam Constitutional:      Appearance: He is well-developed. He is ill-appearing.  HENT:     Head: Normocephalic.     Mouth/Throat:     Mouth: Mucous membranes are dry.  Eyes:     Conjunctiva/sclera: Conjunctivae normal.  Neck:     Musculoskeletal: Normal range of motion and neck supple.  Cardiovascular:     Rate and Rhythm: Regular rhythm. Tachycardia present.     Heart sounds: No murmur.  Pulmonary:     Effort: Pulmonary effort is normal.     Breath sounds: Normal breath sounds. No wheezing, rhonchi or rales.     Comments: Left upper chest tender to palpation. Chest:     Chest wall: No tenderness.  Abdominal:     General: There is no distension.     Palpations: Abdomen is soft.     Tenderness: There is abdominal tenderness (Generalized). There is no guarding or rebound.  Musculoskeletal: Normal range of motion.  Skin:    General: Skin is warm and dry.     Findings: No rash.  Neurological:     Mental Status: He is alert.      ED Treatments / Results  Labs (all labs ordered are listed, but only abnormal results are displayed) Labs Reviewed  CULTURE, BLOOD (ROUTINE X 2)  CULTURE, BLOOD (ROUTINE X 2)  CBC WITH DIFFERENTIAL/PLATELET  LIPASE, BLOOD  COMPREHENSIVE METABOLIC PANEL  LACTIC ACID, PLASMA  LACTIC ACID, PLASMA  TROPONIN I   Results for orders placed or performed during the hospital encounter of 11/05/18  CBC with Differential  Result Value Ref Range   WBC 17.6 (H) 4.0 - 10.5 K/uL   RBC 4.05 (L) 4.22 - 5.81  MIL/uL   Hemoglobin 12.6 (L) 13.0 - 17.0 g/dL   HCT 39.9 39.0 - 52.0 %   MCV 98.5 80.0 - 100.0 fL   MCH 31.1 26.0 - 34.0 pg   MCHC 31.6 30.0 - 36.0 g/dL   RDW 18.6 (H) 11.5 - 15.5 %   Platelets 232 150 - 400 K/uL   nRBC 0.0 0.0 - 0.2 %   Neutrophils Relative % 84 %   Neutro Abs 14.9 (H) 1.7 - 7.7 K/uL   Lymphocytes Relative 8 %   Lymphs  Abs 1.5 0.7 - 4.0 K/uL   Monocytes Relative 6 %   Monocytes Absolute 1.0 0.1 - 1.0 K/uL   Eosinophils Relative 0 %   Eosinophils Absolute 0.0 0.0 - 0.5 K/uL   Basophils Relative 1 %   Basophils Absolute 0.1 0.0 - 0.1 K/uL   Immature Granulocytes 1 %   Abs Immature Granulocytes 0.16 (H) 0.00 - 0.07 K/uL  Lipase, blood  Result Value Ref Range   Lipase 92 (H) 11 - 51 U/L  Comprehensive metabolic panel  Result Value Ref Range   Sodium 143 135 - 145 mmol/L   Potassium 3.9 3.5 - 5.1 mmol/L   Chloride 107 98 - 111 mmol/L   CO2 10 (L) 22 - 32 mmol/L   Glucose, Bld 59 (L) 70 - 99 mg/dL   BUN 11 6 - 20 mg/dL   Creatinine, Ser 1.14 0.61 - 1.24 mg/dL   Calcium 8.5 (L) 8.9 - 10.3 mg/dL   Total Protein 8.1 6.5 - 8.1 g/dL   Albumin 3.6 3.5 - 5.0 g/dL   AST 81 (H) 15 - 41 U/L   ALT 23 0 - 44 U/L   Alkaline Phosphatase 100 38 - 126 U/L   Total Bilirubin 1.7 (H) 0.3 - 1.2 mg/dL   GFR calc non Af Amer >60 >60 mL/min   GFR calc Af Amer >60 >60 mL/min   Anion gap 26 (H) 5 - 15  Lactic acid, plasma  Result Value Ref Range   Lactic Acid, Venous 3.1 (HH) 0.5 - 1.9 mmol/L  Troponin I - Once  Result Value Ref Range   Troponin I <0.03 <0.03 ng/mL  CBG monitoring, ED  Result Value Ref Range   Glucose-Capillary 59 (L) 70 - 99 mg/dL     EKG None  Radiology No results found.  Procedures Procedures (including critical care time) CRITICAL CARE Performed by: Dewaine Oats   Total critical care time: 40 minutes  Critical care time was exclusive of separately billable procedures and treating other patients.  Critical care was necessary to treat or  prevent imminent or life-threatening deterioration.  Critical care was time spent personally by me on the following activities: development of treatment plan with patient and/or surrogate as well as nursing, discussions with consultants, evaluation of patient's response to treatment, examination of patient, obtaining history from patient or surrogate, ordering and performing treatments and interventions, ordering and review of laboratory studies, ordering and review of radiographic studies, pulse oximetry and re-evaluation of patient's condition.  Medications Ordered in ED Medications  sodium chloride 0.9 % bolus 500 mL (has no administration in time range)  pantoprazole (PROTONIX) injection 40 mg (has no administration in time range)     Initial Impression / Assessment and Plan / ED Course  I have reviewed the triage vital signs and the nursing notes.  Pertinent labs & imaging results that were available during my care of the patient were reviewed by me and considered in my medical decision making (see chart for details).     Patient to ED with weakness, abdominal pain, cough, SOB x 1 week. He called EMS tonight because "I couldn't get up". Per EMS, febrile to 101.7. Here he is afebrile.   He has a longstanding alcohol dependence with ongoing use. H/o pancreatitis. Labs are concerning for elevated lipase (92), alcoholic ketoacidosis with CO2 10, anion gap of 26. He has a significant leukocytosis of over 17.   Fluids started including banana bag for thiamine given hypoglycemia of 59. Amp  D50 also given.   VSS. CT abd/pel pending for full evaluation given abdominal pain in setting of remote colon CA, ulcer disease, pancreatitis. Abdomen is soft, diffusely tender without peritoneal signs. Feel he can be admitted at this point to medicine.  Discussed with Internal Medicine for unassigned admission who accepts the patient onto their service.     Final Clinical Impressions(s) / ED Diagnoses     Final diagnoses:  None   1. Alcoholic ketoacidosis 2. Pancreatitis 3. Severe, persistent alcohol dependence 4. Dehydration 5. Abdominal pain  ED Discharge Orders    None       Dennie Bible 11/05/18 2229    Orpah Greek, MD 11/16/18 0735    Charlann Lange, PA-C 12/21/18 7989    Orpah Greek, MD 12/22/18 [phone]

## 2018-11-05 NOTE — ED Notes (Signed)
Ordered diet tray for pt  

## 2018-11-05 NOTE — ED Notes (Signed)
Pt taking po fluids and tolerating well. 

## 2018-11-05 NOTE — ED Notes (Signed)
Patient transported to CT 

## 2018-11-05 NOTE — ED Notes (Signed)
Pt requesting pain meds. Will inform provider.

## 2018-11-05 NOTE — ED Notes (Signed)
Pt vomiting, figity and very uncomfortable.  CIWA was an 11.  EKG completed, provider returned page, ordered medication. Medication given.

## 2018-11-05 NOTE — ED Notes (Signed)
Pharmacy contacted for medications

## 2018-11-05 NOTE — ED Notes (Signed)
RN is drawing labs.

## 2018-11-05 NOTE — ED Notes (Signed)
Pt vomits moderate amount

## 2018-11-06 ENCOUNTER — Encounter (HOSPITAL_COMMUNITY): Payer: Self-pay

## 2018-11-06 ENCOUNTER — Other Ambulatory Visit: Payer: Self-pay

## 2018-11-06 DIAGNOSIS — E876 Hypokalemia: Secondary | ICD-10-CM

## 2018-11-06 DIAGNOSIS — K700 Alcoholic fatty liver: Secondary | ICD-10-CM

## 2018-11-06 LAB — CBC
HEMATOCRIT: 37.8 % — AB (ref 39.0–52.0)
Hemoglobin: 12.7 g/dL — ABNORMAL LOW (ref 13.0–17.0)
MCH: 30.1 pg (ref 26.0–34.0)
MCHC: 33.6 g/dL (ref 30.0–36.0)
MCV: 89.6 fL (ref 80.0–100.0)
NRBC: 0 % (ref 0.0–0.2)
Platelets: 174 10*3/uL (ref 150–400)
RBC: 4.22 MIL/uL (ref 4.22–5.81)
RDW: 17.5 % — ABNORMAL HIGH (ref 11.5–15.5)
WBC: 7.6 10*3/uL (ref 4.0–10.5)

## 2018-11-06 LAB — COMPREHENSIVE METABOLIC PANEL
ALT: 22 U/L (ref 0–44)
AST: 97 U/L — ABNORMAL HIGH (ref 15–41)
Albumin: 3.3 g/dL — ABNORMAL LOW (ref 3.5–5.0)
Alkaline Phosphatase: 108 U/L (ref 38–126)
Anion gap: 15 (ref 5–15)
CO2: 24 mmol/L (ref 22–32)
Calcium: 8.8 mg/dL — ABNORMAL LOW (ref 8.9–10.3)
Chloride: 95 mmol/L — ABNORMAL LOW (ref 98–111)
Creatinine, Ser: 0.89 mg/dL (ref 0.61–1.24)
GFR calc Af Amer: >60 mL/min (ref >60)
Glucose, Bld: 130 mg/dL — ABNORMAL HIGH (ref 70–99)
Potassium: 3 mmol/L — ABNORMAL LOW (ref 3.5–5.1)
Sodium: 134 mmol/L — ABNORMAL LOW (ref 135–145)
TOTAL PROTEIN: 7.4 g/dL (ref 6.5–8.1)
Total Bilirubin: 1.3 mg/dL — ABNORMAL HIGH (ref 0.3–1.2)

## 2018-11-06 LAB — PHOSPHORUS: Phosphorus: <1 mg/dL — CL (ref 2.5–4.6)

## 2018-11-06 LAB — MAGNESIUM: Magnesium: 1.3 mg/dL — ABNORMAL LOW (ref 1.7–2.4)

## 2018-11-06 LAB — GLUCOSE, CAPILLARY: Glucose-Capillary: 160 mg/dL — ABNORMAL HIGH (ref 70–99)

## 2018-11-06 LAB — CBG MONITORING, ED
Glucose-Capillary: 118 mg/dL — ABNORMAL HIGH (ref 70–99)
Glucose-Capillary: 138 mg/dL — ABNORMAL HIGH (ref 70–99)

## 2018-11-06 LAB — CALCIUM, IONIZED: Calcium, Ionized, Serum: 4.4 mg/dL — ABNORMAL LOW (ref 4.5–5.6)

## 2018-11-06 MED ORDER — MAGNESIUM SULFATE 2 GM/50ML IV SOLN
2.0000 g | Freq: Once | INTRAVENOUS | Status: AC
  Administered 2018-11-06: 2 g via INTRAVENOUS
  Filled 2018-11-06: qty 50

## 2018-11-06 MED ORDER — INFLUENZA VAC SPLIT QUAD 0.5 ML IM SUSY
0.5000 mL | PREFILLED_SYRINGE | INTRAMUSCULAR | Status: AC
  Administered 2018-11-08: 0.5 mL via INTRAMUSCULAR
  Filled 2018-11-06: qty 0.5

## 2018-11-06 MED ORDER — POTASSIUM CHLORIDE CRYS ER 20 MEQ PO TBCR
40.0000 meq | EXTENDED_RELEASE_TABLET | Freq: Once | ORAL | Status: AC
  Administered 2018-11-06: 40 meq via ORAL
  Filled 2018-11-06: qty 2

## 2018-11-06 MED ORDER — PROMETHAZINE HCL 25 MG/ML IJ SOLN
12.5000 mg | Freq: Three times a day (TID) | INTRAMUSCULAR | Status: DC | PRN
  Administered 2018-11-06: 12.5 mg via INTRAVENOUS
  Filled 2018-11-06 (×2): qty 1

## 2018-11-06 MED ORDER — ADULT MULTIVITAMIN W/MINERALS CH
1.0000 | ORAL_TABLET | Freq: Every day | ORAL | Status: DC
  Administered 2018-11-06 – 2018-11-10 (×5): 1 via ORAL
  Filled 2018-11-06 (×5): qty 1

## 2018-11-06 MED ORDER — ENSURE ENLIVE PO LIQD
237.0000 mL | Freq: Two times a day (BID) | ORAL | Status: DC
  Administered 2018-11-06 – 2018-11-10 (×10): 237 mL via ORAL

## 2018-11-06 MED ORDER — POTASSIUM PHOSPHATES 15 MMOLE/5ML IV SOLN
40.0000 mmol | Freq: Once | INTRAVENOUS | Status: AC
  Administered 2018-11-06: 40 mmol via INTRAVENOUS
  Filled 2018-11-06: qty 13.33

## 2018-11-06 MED ORDER — POTASSIUM CHLORIDE 10 MEQ/100ML IV SOLN: 10.0000 meq | INTRAVENOUS | Status: DC

## 2018-11-06 NOTE — ED Notes (Signed)
Hospital bed arrived in ED.  Pt placed on clean hospital bed for comfort.  He attempted to stand, was very wobbly.  RN requested that tec place condom cath for pt's safety.

## 2018-11-06 NOTE — ED Notes (Signed)
Breakfast tray ordered 

## 2018-11-06 NOTE — Progress Notes (Signed)
Subjective:  Richard [name] is a 53 y.o. with PMH of AUD admit for alcoholic ketoacidosis on hospital day 1  Richard [name] was examined and evaluated at bedside this AM. He was observed resting comfortably in bed. His abdominal tenderness has significantly improved and besides the abdominal pain he states he feels fine. He had episode of nausea and vomiting last night and was intermittently hypoglycemic. He states he feels well enough to attempt to eat breakfast today.  Objective:  Vital signs in last 24 hours: Vitals:   11/06/18 0645 11/06/18 0715 11/06/18 0730 11/06/18 0853  BP: (!) 147/110 (!) 144/107 (!) 141/96 (!) 147/104  Pulse: 94 97 (!) 103 (!) 106  Resp: 19 20 19    Temp:    97.9 F (36.6 C)  TempSrc:    Oral  SpO2: 100% 98% 97% 99%    Physical Exam  Constitutional: He is oriented to person, place, and time. No distress.  Cachetic  HENT:  Mouth/Throat: Oropharynx is clear and moist.  Eyes: Conjunctivae are normal.  Neck: Normal range of motion. Neck supple.  Cardiovascular: Normal rate, regular rhythm, normal heart sounds and intact distal pulses.  No murmur heard. Abdominal: Soft. Bowel sounds are normal. He exhibits distension (hepatomegaly). There is abdominal tenderness (peri-umbilical tenderness to deep palpation). There is no rebound and no guarding.  Musculoskeletal: Normal range of motion.        General: No edema.  Neurological: He is alert and oriented to person, place, and time.  Skin: Skin is warm and dry.   Assessment/Plan:  Principal Problem:   High anion gap metabolic acidosis Active Problems:   Hepatic steatosis   Alcohol abuse   Alcoholic ketoacidosis   Abdominal pain  Richard [name] is a 53 yo M w/ PMH of alcohol use disorder presenting with nausea and vomiting secondary to alcoholic ketoacidosis. His gap metabolic acidosis has now resolved with aggressive fluid resuscitation. His nausea, vomiting and abdominal pain is slowly improving. He is not having  significant withdrawal symptoms at this time. He is recovering well but has significant electrolyte abnormalities secondary to poor dietary nutrition. We will need to replete and correct his electrolyte abnormalities and ensure he is able to tolerate oral intake. He has been without alcohol for about 24 hours now and we expect withdrawal symptoms to begin appearing. Currently on CIWA.  Anion gap metabolic acidosis 2/2 alcoholic ketoacidosis & lactic acidosis Received 2.5L boluses on admission and currently on maintenance of Dextrose-NaCl 125cc/hr. Co2 14->16->24. Gap 20->15. Symptoms of nausea and vomiting improving. C/w aggressive fluid resuscitation and encourage oral intake - C/w D5NaCl @ 125cc/hr - Encourage oral intake - Trend BMP - Phenergan 12.5mg  for nausea [will need to monitor qtc prolongation had >500 on EKG]  Alcohol Use Disorder Drinks 1 pint of vodka daily. Presented with 226 alcohol level. ~24-30 hrs since last drink. Will need close observation for withdrawal symptoms. Prior hx of delirium during hospitalization from alcohol withdrawal but no seizures. States he would like to quit drinking. - CIWA w/ Ativan 2-3mg  - F/u social work consult for alcohol abstinence resources - C/w Thiamine and folate  Hypomagnesia, Hypophosphatemia, Hypokalemia, Hypocalcemia 2/2 poor oral intake Appear cachetic. Denies any numbness, tingling, cramping. Endorsing improving abdominal pain. Am labs show: Phos <1.0, Mag 1.3, Ionized calcium 4.4 - Mag IV 2g - K-dur 33mEq - KPhosphate IV 47mmol  - Trend electrolytes  Transaminitis 2/2 Alcohol-induced steatosis AST 97, ALT 22 consistent with alcohol induced liver injury. Hepatomegaly on exam but no  asterixis or jaundice. Albumin 3.3. TBili 1.3 - C/w trend - Alcoholic cessation  HTN Bp this am 147/104 - C/w home meds: amlodipine 10mg  daily  Hx of Gerd - C/w home meds: Pantoprazole 40mg  daily  DVT prophx: Lovenox Diet: Regular Bowel: N/A Code:  Full  Dispo: Anticipated discharge in approximately 3-4 day(s).   Mosetta Anis, MD 11/06/2018, 10:25 AM Pager: [phone]

## 2018-11-06 NOTE — Progress Notes (Signed)
Initial Nutrition Assessment  DOCUMENTATION CODES:   Severe malnutrition in context of social or environmental circumstances, Underweight  INTERVENTION:   Monitor magnesium, potassium, and phosphorus daily for at least 3 days, MD to replete as needed, as pt is at risk for refeeding syndrome given EtOH abuse and severe malnutrition.  - MVI with minerals daily  - Continue Ensure Enlive po BID, each supplement provides 350 kcal and 20 grams of protein (chocolate or strawberry)  - Continue thiamine and folic acid in the setting of EtOH abuse  NUTRITION DIAGNOSIS:   Severe Malnutrition related to social / environmental circumstances (EtOH abuse) as evidenced by moderate fat depletion, severe fat depletion, moderate muscle depletion, severe muscle depletion, percent weight loss (9.4% weight loss in less than 5 months).  GOAL:   Patient will meet greater than or equal to 90% of their needs  MONITOR:   PO intake, Supplement acceptance, Labs, Weight trends  REASON FOR ASSESSMENT:   Malnutrition Screening Tool    ASSESSMENT:   53 year old male who presented to the ED on 2/3 with N/V x 1 week, weakness, and chest pain. PMH significant for EtOH abuse (approximtately 1 pint of vodka daily), stage II colon cancer s/p sigmoid colectomy in 2007, pancreatitis, HTN, GERD.  Pt admitted with alcoholic ketoacidosis.  Spoke with pt at bedside. RN in room providing nursing care at time of visit.  Pt shares that he typically eats twice daily even though he does not have much of an appetite. In the morning, pt has eggs, bacon, oatmeal, grits, and pancakes (all at one meal per pt). In the afternoon, pt has a burger from a restaurant or fried chicken. Pt states that he cooks most foods for himself.  Pt states that "I've always been a small eater" but that since Saturday, he has barely eaten anything due to N/V. Pt shares that he drank liquids but "they came right back up." Pt reports that breakfast  this morning was the first time he's eaten since Saturday. Pt reports eating "almost all" of his breakfast this morning which included scrambled eggs, sausage, a biscuit, and fruit. Pt states that he didn't eat much lunch (noted ~10% completed lunch meal tray at bedside) due to just having breakfast and wanting to "take it slow."  Pt endorses "slow but steady" weight loss since colectomy surgery in 2007. Pt reports that prior to surgery, he weighed 182 lbs but now weighs 125 lbs.  Per weight history in chart, pt with 5.9 kg weight loss since 06/27/18. This is a 9.4% weight loss in less than 5 months which is significant for timeframe.  Pt shares that he occasionally has difficulty swallowing due to "stuff in the back of my throat." Pt states that "I throw up every morning, and its phlegm." Pt denies experiencing daily emesis with PO intake. Pt states that he eats mostly soft foods and does not eat much meat.  Discussed the importance of adequate kcal and protein intake in maintaining lean muscle mass and preventing further weight loss. Discussed the importance of adequate nutrition in healing. Pt expresses understanding.  Meal Completion: 50%  Medications reviewed and include: Ensure Enlive BID, folic acid, thiamine, Protonix, potassium phosphate 40 mmol once IVF: D5 @ 125 ml/hr (provides 510 kcal daily)  Labs reviewed: sodium 134 (L), potassium 3.0 (L), phosphorus <1 (L), magnesium 1.3 (L) CBG's: 160, 138, 118, 129, 93, 118 x 24 hours  UOP: 1900 ml x 24 hours  NUTRITION - FOCUSED PHYSICAL EXAM:  Most Recent Value  Orbital Region  Moderate depletion  Upper Arm Region  Severe depletion  Thoracic and Lumbar Region  Severe depletion  Buccal Region  Moderate depletion  Temple Region  Moderate depletion  Clavicle Bone Region  Severe depletion  Clavicle and Acromion Bone Region  Severe depletion  Scapular Bone Region  Severe depletion  Dorsal Hand  Moderate depletion  Patellar Region   Severe depletion  Anterior Thigh Region  Severe depletion  Posterior Calf Region  Severe depletion  Edema (RD Assessment)  None  Hair  Reviewed  Eyes  Reviewed  Mouth  Reviewed  Skin  Reviewed  Nails  Reviewed       Diet Order:   Diet Order            Diet regular Room service appropriate? Yes; Fluid consistency: Thin  Diet effective now              EDUCATION NEEDS:   Education needs have been addressed  Skin:  Skin Assessment: Reviewed RN Assessment  Last BM:  2/3  Height:   Ht Readings from Last 1 Encounters:  11/06/18 6' (1.829 m)    Weight:   Wt Readings from Last 1 Encounters:  11/06/18 56.7 kg    Ideal Body Weight:  80.9 kg  BMI:  Body mass index is 16.95 kg/m.  Estimated Nutritional Needs:   Kcal:  1800-2000  Protein:  80-95 grams  Fluid:  >/= 1.8 L    Gaynell Face, MS, RD, LDN Inpatient Clinical Dietitian Pager: [phone] Weekend/After Hours: [phone]

## 2018-11-07 ENCOUNTER — Encounter (HOSPITAL_COMMUNITY): Payer: Self-pay | Admitting: Physician Assistant

## 2018-11-07 ENCOUNTER — Other Ambulatory Visit (HOSPITAL_COMMUNITY): Payer: Self-pay

## 2018-11-07 DIAGNOSIS — F10231 Alcohol dependence with withdrawal delirium: Secondary | ICD-10-CM

## 2018-11-07 DIAGNOSIS — I471 Supraventricular tachycardia: Secondary | ICD-10-CM

## 2018-11-07 DIAGNOSIS — Z79899 Other long term (current) drug therapy: Secondary | ICD-10-CM

## 2018-11-07 DIAGNOSIS — E872 Acidosis: Principal | ICD-10-CM

## 2018-11-07 LAB — COMPREHENSIVE METABOLIC PANEL
ALK PHOS: 91 U/L (ref 38–126)
ALT: 23 U/L (ref 0–44)
ANION GAP: 15 (ref 5–15)
AST: 84 U/L — ABNORMAL HIGH (ref 15–41)
Albumin: 3.3 g/dL — ABNORMAL LOW (ref 3.5–5.0)
BUN: <5 mg/dL — ABNORMAL LOW (ref 6–20)
CO2: 23 mmol/L (ref 22–32)
Calcium: 9.3 mg/dL (ref 8.9–10.3)
Chloride: 97 mmol/L — ABNORMAL LOW (ref 98–111)
Creatinine, Ser: 0.73 mg/dL (ref 0.61–1.24)
GFR calc Af Amer: >60 mL/min (ref >60)
GFR calc non Af Amer: >60 mL/min (ref >60)
Glucose, Bld: 99 mg/dL (ref 70–99)
Potassium: 3.2 mmol/L — ABNORMAL LOW (ref 3.5–5.1)
Sodium: 135 mmol/L (ref 135–145)
Total Bilirubin: 2.1 mg/dL — ABNORMAL HIGH (ref 0.3–1.2)
Total Protein: 7.4 g/dL (ref 6.5–8.1)

## 2018-11-07 LAB — PHOSPHORUS
Phosphorus: 1.8 mg/dL — ABNORMAL LOW (ref 2.5–4.6)
Phosphorus: 2.9 mg/dL (ref 2.5–4.6)

## 2018-11-07 LAB — MAGNESIUM
Magnesium: 1.3 mg/dL — ABNORMAL LOW (ref 1.7–2.4)
Magnesium: 2.5 mg/dL — ABNORMAL HIGH (ref 1.7–2.4)

## 2018-11-07 LAB — BASIC METABOLIC PANEL
Anion gap: 12 (ref 5–15)
BUN: 5 mg/dL — ABNORMAL LOW (ref 6–20)
CO2: 25 mmol/L (ref 22–32)
Calcium: 9.4 mg/dL (ref 8.9–10.3)
Chloride: 100 mmol/L (ref 98–111)
Creatinine, Ser: 0.71 mg/dL (ref 0.61–1.24)
GFR calc Af Amer: >60 mL/min (ref >60)
GFR calc non Af Amer: >60 mL/min (ref >60)
Glucose, Bld: 79 mg/dL (ref 70–99)
Potassium: 4.2 mmol/L (ref 3.5–5.1)
Sodium: 137 mmol/L (ref 135–145)

## 2018-11-07 LAB — GLUCOSE, CAPILLARY: GLUCOSE-CAPILLARY: 102 mg/dL — AB (ref 70–99)

## 2018-11-07 LAB — MRSA PCR SCREENING: MRSA by PCR: NEGATIVE

## 2018-11-07 MED ORDER — METOPROLOL TARTRATE 5 MG/5ML IV SOLN
2.5000 mg | INTRAVENOUS | Status: DC | PRN
  Administered 2018-11-07 (×2): 5 mg via INTRAVENOUS
  Filled 2018-11-07 (×2): qty 5

## 2018-11-07 MED ORDER — LACTATED RINGERS IV BOLUS
1000.0000 mL | Freq: Once | INTRAVENOUS | Status: AC
  Administered 2018-11-07: 1000 mL via INTRAVENOUS

## 2018-11-07 MED ORDER — CHLORDIAZEPOXIDE HCL 25 MG PO CAPS: 25.0000 mg | ORAL_CAPSULE | Freq: Every day | ORAL | Status: DC

## 2018-11-07 MED ORDER — THIAMINE HCL 100 MG/ML IJ SOLN
100.0000 mg | Freq: Once | INTRAMUSCULAR | Status: AC
  Administered 2018-11-07: 100 mg via INTRAMUSCULAR
  Filled 2018-11-07: qty 2

## 2018-11-07 MED ORDER — CHLORDIAZEPOXIDE HCL 25 MG PO CAPS
25.0000 mg | ORAL_CAPSULE | Freq: Four times a day (QID) | ORAL | Status: DC | PRN
  Filled 2018-11-07: qty 1

## 2018-11-07 MED ORDER — CHLORDIAZEPOXIDE HCL 25 MG PO CAPS
25.0000 mg | ORAL_CAPSULE | Freq: Four times a day (QID) | ORAL | Status: AC
  Administered 2018-11-07 – 2018-11-08 (×5): 25 mg via ORAL
  Filled 2018-11-07 (×5): qty 1

## 2018-11-07 MED ORDER — METOPROLOL TARTRATE 5 MG/5ML IV SOLN
INTRAVENOUS | Status: AC
  Administered 2018-11-07: 5 mg via INTRAVENOUS
  Filled 2018-11-07: qty 5

## 2018-11-07 MED ORDER — ADENOSINE 6 MG/2ML IV SOLN
12.0000 mg | Freq: Once | INTRAVENOUS | Status: DC
  Filled 2018-11-07: qty 4

## 2018-11-07 MED ORDER — MAGNESIUM SULFATE 2 GM/50ML IV SOLN
2.0000 g | Freq: Once | INTRAVENOUS | Status: AC
  Administered 2018-11-07: 2 g via INTRAVENOUS
  Filled 2018-11-07: qty 50

## 2018-11-07 MED ORDER — DEXMEDETOMIDINE HCL IN NACL 400 MCG/100ML IV SOLN
0.4000 ug/kg/h | INTRAVENOUS | Status: DC
  Administered 2018-11-07: 0.2 ug/kg/h via INTRAVENOUS
  Administered 2018-11-08: 1 ug/kg/h via INTRAVENOUS
  Administered 2018-11-08 – 2018-11-09 (×3): 0.8 ug/kg/h via INTRAVENOUS
  Filled 2018-11-07 (×5): qty 100

## 2018-11-07 MED ORDER — ADULT MULTIVITAMIN W/MINERALS CH: 1.0000 | ORAL_TABLET | Freq: Every day | ORAL | Status: DC

## 2018-11-07 MED ORDER — LOPERAMIDE HCL 2 MG PO CAPS: 2.0000 mg | ORAL_CAPSULE | ORAL | Status: DC | PRN

## 2018-11-07 MED ORDER — LACTATED RINGERS IV SOLN
INTRAVENOUS | Status: DC
  Administered 2018-11-07 – 2018-11-09 (×6): via INTRAVENOUS

## 2018-11-07 MED ORDER — METOPROLOL TARTRATE 50 MG PO TABS
50.0000 mg | ORAL_TABLET | Freq: Three times a day (TID) | ORAL | Status: DC
  Administered 2018-11-07 – 2018-11-09 (×5): 50 mg via ORAL
  Filled 2018-11-07 (×6): qty 1

## 2018-11-07 MED ORDER — LORAZEPAM 2 MG/ML IJ SOLN
INTRAMUSCULAR | Status: AC
  Filled 2018-11-07: qty 1

## 2018-11-07 MED ORDER — ONDANSETRON 4 MG PO TBDP: 4.0000 mg | ORAL_TABLET | Freq: Four times a day (QID) | ORAL | Status: DC | PRN

## 2018-11-07 MED ORDER — HYDROXYZINE HCL 25 MG PO TABS: 25.0000 mg | ORAL_TABLET | Freq: Four times a day (QID) | ORAL | Status: DC | PRN

## 2018-11-07 MED ORDER — LORAZEPAM 2 MG/ML IJ SOLN: 1.0000 mg | INTRAMUSCULAR | Status: DC | PRN

## 2018-11-07 MED ORDER — POTASSIUM CHLORIDE CRYS ER 20 MEQ PO TBCR
40.0000 meq | EXTENDED_RELEASE_TABLET | Freq: Once | ORAL | Status: AC
  Administered 2018-11-07: 40 meq via ORAL
  Filled 2018-11-07: qty 2

## 2018-11-07 MED ORDER — METOPROLOL TARTRATE 5 MG/5ML IV SOLN
5.0000 mg | INTRAVENOUS | Status: AC
  Administered 2018-11-07: 5 mg via INTRAVENOUS

## 2018-11-07 MED ORDER — METOPROLOL TARTRATE 5 MG/5ML IV SOLN
5.0000 mg | Freq: Once | INTRAVENOUS | Status: AC
  Administered 2018-11-07: 5 mg via INTRAVENOUS
  Filled 2018-11-07: qty 5

## 2018-11-07 MED ORDER — CHLORDIAZEPOXIDE HCL 25 MG PO CAPS
25.0000 mg | ORAL_CAPSULE | Freq: Three times a day (TID) | ORAL | Status: AC
  Administered 2018-11-09 (×3): 25 mg via ORAL
  Filled 2018-11-07 (×3): qty 1

## 2018-11-07 MED ORDER — POTASSIUM PHOSPHATES 15 MMOLE/5ML IV SOLN
30.0000 mmol | Freq: Once | INTRAVENOUS | Status: AC
  Administered 2018-11-07: 30 mmol via INTRAVENOUS
  Filled 2018-11-07: qty 10

## 2018-11-07 MED ORDER — POTASSIUM PHOSPHATES 15 MMOLE/5ML IV SOLN
40.0000 mmol | Freq: Once | INTRAVENOUS | Status: DC
  Filled 2018-11-07: qty 13.33

## 2018-11-07 MED ORDER — METOPROLOL TARTRATE 5 MG/5ML IV SOLN: 2.5000 mg | INTRAVENOUS | Status: DC | PRN

## 2018-11-07 MED ORDER — FOLIC ACID 1 MG PO TABS
1.0000 mg | ORAL_TABLET | Freq: Every day | ORAL | Status: DC
  Administered 2018-11-07 – 2018-11-10 (×4): 1 mg via ORAL
  Filled 2018-11-07 (×3): qty 1

## 2018-11-07 MED ORDER — METOPROLOL TARTRATE 5 MG/5ML IV SOLN
5.0000 mg | INTRAVENOUS | Status: AC | PRN
  Administered 2018-11-07 (×2): 5 mg via INTRAVENOUS
  Filled 2018-11-07 (×2): qty 5

## 2018-11-07 MED ORDER — CHLORDIAZEPOXIDE HCL 25 MG PO CAPS
25.0000 mg | ORAL_CAPSULE | ORAL | Status: DC
  Administered 2018-11-10: 25 mg via ORAL
  Filled 2018-11-07: qty 1

## 2018-11-07 MED ORDER — LORAZEPAM 2 MG/ML IJ SOLN
1.0000 mg | INTRAMUSCULAR | Status: DC | PRN
  Administered 2018-11-07 – 2018-11-09 (×3): 2 mg via INTRAVENOUS
  Filled 2018-11-07 (×2): qty 1

## 2018-11-07 MED ORDER — VITAMIN B-1 100 MG PO TABS: 100.0000 mg | ORAL_TABLET | Freq: Every day | ORAL | Status: DC

## 2018-11-07 MED ORDER — VITAMIN B-1 100 MG PO TABS
100.0000 mg | ORAL_TABLET | Freq: Every day | ORAL | Status: DC
  Administered 2018-11-07 – 2018-11-10 (×4): 100 mg via ORAL
  Filled 2018-11-07 (×4): qty 1

## 2018-11-07 NOTE — Progress Notes (Signed)
Received call from central tele about HR in 190's. Patient not symptomatic in bathroom . Got back to bed tried to bear down and blow through straw. Dr. Hulen Skains back with order of metoprolol. Will continue to monitor.

## 2018-11-07 NOTE — Consult Note (Signed)
NAME:  Richard [name], MRN:  [id number], DOB:  December 15, 1965, LOS: 2 ADMISSION DATE:  11/05/2018, CONSULTATION DATE:  2/5 REFERRING MD: Waymon Budge, CHIEF COMPLAINT:  Alcohol withdrawal    Brief History   53 year old male patient with a history of alcohol abuse.  Last drink about 24 hours prior to admission on 2/3.  Developing worsening tachycardia, agitation, combativeness and delirium which progressed from 2/4 into 2/5.  Pulmonary/critical care asked to see on 2/5 for consideration of Precedex infusion   History of present illness   This is a 53 year old male patient with a history as mentioned below who presented to the emergency room On 2/3 with chief complaint of abdominal discomfort.  As mentioned below is a significant history of alcohol abuse disorder, and he continues to actively drink.  On admission he complained of marked discomfort of the abdomen primarily periumbilical in nature with intermittent nausea and vomiting there was no blood or coffee-ground discoloration noted.  He had decreased p.o. intake and on evaluation in the emergency room he was found to have an anion gap metabolic acidosis felt secondary to ketoacidosis from alcoholism, mild lactic acidosis mildly elevated lipase in elevated AST.  He was admitted with a working diagnosis of alcoholic ketoacidosis as well as mild pancreatitis.  On 2/4 his abdominal pain had improved some however he began to develop increased restlessness and confusion.  Later that day he developed significant tachycardia with heart rate up in the 170s ultimately necessitating cardiology consultation.  They recommended continuing beta-blockade and ongoing telemetry monitoring.  In addition they recommended TSH and TEE evaluation.  Over the following hours he became progressively agitated, and at times combative he is continued to have rapid cardiac rate pulmonary has been asked to see on 2/5 for consideration for Precedex infusion.  Past Medical History  HETOH  abuse, ETOH related pancreatitis, HTN, chronic abd pain,  colon cancer (s/p resection but no f/u)  Significant Hospital Events   2/3 admitted with mild pancreatitis, abdominal pain, and alcoholic ketoacidosis 2/4: Pain better began to have episodes of supraventricular tachycardia, worsening delirium, and combativeness.  Nursing staff noting he seemed to get more confused with lorazepam per CIWA protocol 2/5: Pulmonary asked to see and consider for possible Precedex infusion  Consults:  Pulmonary 2/5 for Precedex Cardiology 2/4 for SVT  Procedures:    Significant Diagnostic Tests:  Echo 2/5  Micro Data:    Antimicrobials:   Interim history/subjective:  Restless and agitated  Objective   Blood pressure (Abnormal) 110/93, pulse (Abnormal) 130, temperature 98.9 F (37.2 C), temperature source Oral, resp. rate 18, height 6' (1.829 m), weight 56.7 kg, SpO2 100 %.        Intake/Output Summary (Last 24 hours) at 11/07/2018 1354 Last data filed at 11/07/2018 1200 Gross per 24 hour  Intake 2178.86 ml  Output 575 ml  Net 1603.86 ml   Filed Weights   11/06/18 1100  Weight: 56.7 kg    Examination: General: This is a thin 53 year old male patient is currently in bed intermittently confused speech is slurred HENT: Normocephalic atraumatic mucous membranes moist sclera nonicteric Lungs: Clear to auscultation no accessory Cardiovascular: Rapid regular rhythm without murmur rub or gallop heart rate currently 140s Abdomen: Soft nontender Extremities: No edema brisk cap refill Neuro: Speech slurred oriented x1 no focal deficits GU: Voids  Resolved Hospital Problem list   Anion gap metabolic acidosis  Assessment & Plan:  Acute metabolic encephalopathy/delirium tremens -Seemingly worse with with Lorazepam per nursing staff Plan  Transfer to intensive care Initiate Precedex and Librium overlap Eventually transition to clonidine taper off Precedex in the next 24 to 48 hours Continue  close observation for safety Continue thiamine and folate Continue maintenance IV fluids Close observation of blood chemistry including magnesium  Supraventricular tachycardia -Likely exacerbated by withdrawal Plan Continuing the current dose of Lopressor Follow-up echocardiogram Often times heart rate will drop with Precedex so we will hold off on further intervention until Precedex infusion initiated  Mild abdominal pain with acute on chronic pancreatitis Plan Follow-up lipase  Hypotension Plan Continuing IV fluid  Fluid and electrolyte imbalance: Hypomagnesemia, Hypophosphatemia, hypokalemia. Plan Aggressive electrolyte replacement Follow-up and replace further as necessary  Anion gap metabolic acidosis  -Initially felt ketosis from alcoholism but also had mildly elevated.  His lactic acid cleared Plan Follow-up chemistry a.m.  History of gastroesophageal reflux disease Plan Continue PPI   Best practice:  Diet: clears Pain/Anxiety/Delirium protocol (if indicated): ETOH w/d started 2/5 VAP protocol (if indicated): na DVT prophylaxis: LMWH GI prophylaxis: PPI Glucose control: na Mobility: BR  Code Status: full code Family Communication: pending Disposition: to ICU for precedex.   Labs   CBC: Recent Labs  Lab 11/05/18 0529 11/06/18 0342  WBC 17.6* 7.6  NEUTROABS 14.9*  --   HGB 12.6* 12.7*  HCT 39.9 37.8*  MCV 98.5 89.6  PLT 232 784    Basic Metabolic Panel: Recent Labs  Lab 11/05/18 0529 11/05/18 0933 11/05/18 1407 11/06/18 0342 11/07/18 0418  NA 143 140 137 134* 135  K 3.9 3.7 3.6 3.0* 3.2*  CL 107 106 106 95* 97*  CO2 10* 14* 16* 24 23  GLUCOSE 59* 95 85 130* 99  BUN 11 9 6  <5* <5*  CREATININE 1.14 1.00 0.86 0.89 0.73  CALCIUM 8.5* 7.9* 7.8* 8.8* 9.3  MG  --  1.7  --  1.3* 1.3*  PHOS  --  2.2*  --  <1.0* 1.8*   GFR: Estimated Creatinine Clearance: 88.6 mL/min (by C-G formula based on SCr of 0.73 mg/dL). Recent Labs  Lab  11/05/18 0529 11/05/18 0654 11/05/18 1407 11/06/18 0342  WBC 17.6*  --   --  7.6  LATICACIDVEN 3.1* 2.2* 1.7  --     Liver Function Tests: Recent Labs  Lab 11/05/18 0529 11/06/18 0342 11/07/18 0418  AST 81* 97* 84*  ALT 23 22 23   ALKPHOS 100 108 91  BILITOT 1.7* 1.3* 2.1*  PROT 8.1 7.4 7.4  ALBUMIN 3.6 3.3* 3.3*   Recent Labs  Lab 11/05/18 0529  LIPASE 92*   No results for input(s): AMMONIA in the last 168 hours.  ABG    Component Value Date/Time   HCO3 17.5 (L) 08/14/2018 0015   ACIDBASEDEF 6.9 (H) 08/14/2018 0015   O2SAT 81.3 08/14/2018 0015     Coagulation Profile: No results for input(s): INR, PROTIME in the last 168 hours.  Cardiac Enzymes: Recent Labs  Lab 11/05/18 0529  TROPONINI <0.03    HbA1C: No results found for: HGBA1C  CBG: Recent Labs  Lab 11/05/18 2229 11/06/18 0110 11/06/18 0632 11/06/18 1221 11/07/18 0930  GLUCAP 129* 118* 138* 160* 102*    Review of Systems:   Not able   Past Medical History  He,  has a past medical history of Acid reflux, Colon cancer ([place]), ETOH abuse, HTN (hypertension), Pancreatitis (08/2018), and PUD (peptic ulcer disease).   Surgical History    Past Surgical History:  Procedure Laterality Date  . LAPAROSCOPIC SIGMOID COLECTOMY  2007  Social History   reports that he has been smoking. He has been smoking about 1.00 pack per day. He has never used smokeless tobacco. He reports current alcohol use of about 2.0 standard drinks of alcohol per week. He reports that he does not use drugs.   Family History   His family history includes Cancer in his sister; Colon cancer in his father. There is no history of CAD, Stroke, or Diabetes.   Allergies Allergies  Allergen Reactions  . Aspirin Other (See Comments)    Acid reflux   . Penicillins Hives    Has patient had a PCN reaction causing immediate rash, facial/tongue/throat swelling, SOB or lightheadedness with hypotension: yes Has patient had a PCN  reaction causing severe rash involving mucus membranes or skin necrosis: no Has patient had a PCN reaction that required hospitalization: no Has patient had a PCN reaction occurring within the last 10 years: no If all of the above answers are "NO", then may proceed with Cephalosporin use.      Home Medications  Prior to Admission medications   Medication Sig Start Date End Date Taking? Authorizing Provider  ondansetron (ZOFRAN) 4 MG tablet Take 1 tablet (4 mg total) by mouth every 8 (eight) hours as needed for nausea or vomiting. 09/28/18  Yes Fulp, Cammie, MD  pantoprazole (PROTONIX) 40 MG tablet Take 1 tablet (40 mg total) by mouth 2 (two) times daily. To reduce stomach acid Patient taking differently: Take 40 mg by mouth daily. To reduce stomach acid 09/28/18  Yes Fulp, Cammie, MD  amLODipine (NORVASC) 10 MG tablet Take 1 tablet (10 mg total) by mouth daily. To lower blood pressure Patient not taking: Reported on 11/05/2018 09/28/18 10/28/18  Fulp, Ander Gaster, MD  doxycycline (VIBRA-TABS) 100 MG tablet Take 1 tablet (100 mg total) by mouth 2 (two) times daily. Patient not taking: Reported on 11/05/2018 09/28/18   Antony Blackbird, MD  folic acid (FOLVITE) 1 MG tablet Take 1 tablet (1 mg total) by mouth daily. Patient not taking: Reported on 11/05/2018 07/20/18   Fulp, Ander Gaster, MD  thiamine 100 MG tablet Take 1 tablet (100 mg total) by mouth daily. Patient not taking: Reported on 11/05/2018 07/20/18   Antony Blackbird, MD  traMADol (ULTRAM) 50 MG tablet 1-2 pills every 6 hours as needed for pain; eat before taking medication Patient not taking: Reported on 11/05/2018 09/28/18   Antony Blackbird, MD     Critical care time: 34 min      Erick Colace ACNP-BC [place] Pager # [phone] OR # [phone] if no answer

## 2018-11-07 NOTE — Progress Notes (Addendum)
Evaluated patient at bedside for SVT. Patient initially admitted for alcoholic ketoacidosis which is now resolved, however he has been withdrawing from alcohol and required ativan throughout the night and this morning due to agitation and delirium. He also has electrolyte abnormalities with hypomag, hypophos, and hypokalemia which are being addressed.   On evaluation patient is somnolent and confused but does wake to voice and has spontaneous movement, talking spontaneously, breathing comfortably. BP initially stable however dipped to SBP of 88 after 5mg  metoprolol push, likely aided by recent ativan administration as well.   We are bolusing him as he is likely fluid down due to hypermetabolic process of EtOH withdrawal.  BP improved to SBP 100s. HR down to 140s from 160s after repeat metoprolol 5mg  IV injection with stable BP. Will repeat IV metoprolol (3rd dose this morning). Cardiology has been consulted and will evaluate patient.   Patient seen by Dr. Beryle Beams.  Addendum:  After 3rd dose of metoprolol HR still 140s, with BP stable in 110s and patient more awake, answering more appropriately. Will f/u cards recs; may try IV cardizem as he is now HD stable.  Alphonzo Grieve, MD [facility] - PGY3 Pager [phone]

## 2018-11-07 NOTE — Progress Notes (Signed)
Patient heart rate up into 170's, the patient laying in bed with no s/s of distress or discomfort, b/p stable . Dr Eileen Stanford paged and ordered 5mg  iv lopressor and EKG. Heart rate in high 140's after given .

## 2018-11-07 NOTE — Progress Notes (Signed)
Patient transfered to 3MW-10 report given to receiving RN. All patients belongings gathered and transferred with patient

## 2018-11-07 NOTE — Significant Event (Signed)
Rapid Response Event Note  Overview:  RN called regarding HR 190's, asymptomatic.    Initial Focused Assessment: Pt supine in bed, asymptomatic, reports some ongoing abd discomfort. BBS clear, alert and oriented x3, skin warm and dry. HR dropped to 140's after receiving 5mg  Metoprolol IVP approx 0430. MD to bedside. New orders given for additional dose of 5 mg Metoprolol, 3 mg Ativan, 1L LR bolus and repeat EKG. HR again dropped to 140's. The plan was to re-evaluate pt after 1 hour.   4327 update I went back to the reassess pt, Dr. Jari Favre at bedside. New orders given and completed by RRT.  5 mg Metoprolol IVP x2 Mag Sulfate 2 g IVPB Potassium phosphate gtt 37mmol CBG 101  18G RAFA    Interventions:  Plan of Care (if not transferred): Continue to monitor and call RRT as needed.  Event Summary:   at      at          [facility], Sela Hua

## 2018-11-07 NOTE — Progress Notes (Signed)
Paged by nursing staff about Richard [name]. He has started to become agitative and combative with violence against nursing staff despite Ativan administration. He is most likely to have worsening withdrawal symptoms. Sitter was ordered earlier today but was informed hospital have no more sitters available.  - Soft Restraints ordered - Will consult pulm for possible precedex drip

## 2018-11-07 NOTE — Progress Notes (Signed)
Paged provider x 5. Provider returned phone call after 12 minutes. Pt to get 5mg  of metoprolol iv push stat and new ECG. Pt asymptomatic.

## 2018-11-07 NOTE — Plan of Care (Signed)
Patient remains extremely agitated, restraints started for patient safety.  Cardiology involved with patient care for HR control.  CIWA protocol continued.  Pending transfer to critical care at this time

## 2018-11-07 NOTE — Progress Notes (Signed)
Patient continues to try to get up and want to leave the floor, Ciwa score 15, after given 4mg  ativan. The patient tried to leave AMA. Dr Eileen Stanford was paged and came to bedside to speak with the patient. Verbal ordered to give 3mg  ativan iv to patient. Patient became compliant to lay back in bed and not leave the hospital.

## 2018-11-07 NOTE — Progress Notes (Signed)
Pt agitated after girlfriend visited. Attempted to reposition patient in bed and he tried to punch nurses and also spit out his Librium at [place]. Pt in 4 point restraints and trying to bite them off. Ativan given and precedex dose increased.

## 2018-11-07 NOTE — Progress Notes (Addendum)
Subjective:  Richard [name] is a 53 y.o. with PMH of AUD admit for alcoholic ketoacidosis on hospital day 2  Richard [name] was examined and evaluated at bedside this AM. He developed tachycardia overnight read as SVT. Telemetry reviewed and repeat EKG performed confirming SVT. He states he feels fine and denies any chest pain, dyspnea. He states he does feel his heart racing but states he is not concerned.  Evaluated patient at later date. He was observed agitated, borderline combative per nursing staff, requesting to leave. He states he wants to go home to see his '3 boys.' He is AAOx2 to self and location but not date. He was observed attempting to walk out of the room with IV still on him.   Objective:  Vital signs in last 24 hours: Vitals:   11/07/18 0900 11/07/18 0945 11/07/18 1013 11/07/18 1035  BP: 112/89 (!) 104/91 119/88 115/84  Pulse:      Resp:  18 17   Temp:      TempSrc:      SpO2: 100% 100%  100%  Weight:      Height:       Physical Exam  Constitutional:  Cachetic  HENT:  Mouth/Throat: Oropharynx is clear and moist.  Eyes: Conjunctivae are normal.  Neck: Normal range of motion. Neck supple.  Cardiovascular: Regular rhythm, normal heart sounds and intact distal pulses.  No murmur heard. Tachycardic  Pulmonary/Chest: Effort normal and breath sounds normal. He has no wheezes. He has no rales.  Abdominal: Soft. Bowel sounds are normal. He exhibits distension (hepatomegaly). There is no abdominal tenderness. There is no rebound and no guarding.  Musculoskeletal: Normal range of motion.        General: No edema.  Neurological: He is alert.  Oriented x2 to name and location  Skin: Skin is warm and dry.   Assessment/Plan:  Principal Problem:   High anion gap metabolic acidosis Active Problems:   Hepatic steatosis   Alcohol abuse   Alcoholic ketoacidosis   Abdominal pain  Richard [name] is a 53 yo M w/ PMH of alcohol use disorder presenting with nausea and vomiting  secondary to alcoholic ketoacidosis. Developed SVT overnight with mild reduction of HR with metoprolol pushes. His blood pressure dropped after his IV push this morning with systolic below 43X and is expected to not tolerate any more medical management. Will consult cardiology for cardioversion. He is also currently actively withdrawing due to his history of alcohol use. He had prior episode of admission to ICU for precedex drip. He may require similar intervention if he continues to be agitated and starts to show more severe withdrawal symptoms but currently appear responsive to Ativan.  Superventricular Tachycardia 2/2 metabolic abnormality Developed tachycardia up to 180s last night and early this am. Received metoprolol 5mg  IV x3 w/ some improvement down to 140s. BP dropped below 90s this am. Unable to tolerate further pharmaceutical management. - Consult Cardiology for cardioversion  Hypomagnesia, Hypophosphatemia, Hypokalemia 2/2 poor oral intake Appear cachetic. Denies any numbness, tingling, cramping. Endorsing resolution of abdominal pain. Am labs show: Phos 1.8, Mag 1.3, K 3.0->3.2 - Mag IV 2g - K-dur 53mEq - KPhosphate IV 12mmol  - Trend electrolytes  Anion gap metabolic acidosis 2/2 alcoholic ketoacidosis & lactic acidosis Co2 16->24->23. Gap 20->15->15. Symptoms of nausea and vomiting resolved. Able to tolerate dinner last night as well as breakfast this morning - Resolved - LR 150cc/hr maintenance fluid - Encourage oral intake - Trend BMP  Alcohol Use Disorder  Presented with 226 alcohol level. ~48 hrs since last drink. CIWA score up to 15 overnight with response to Ativan 8mg  over 3 hours period. Prior hx of delirium during hospitalization from alcohol withdrawal but no seizures. Intermittent agitated this morning - CIWA w/ Ativan 2-3mg  - F/u social work consult for alcohol abstinence resources - C/w Thiamine and folate  HTN Had episode of hypotension this morning after  repeated metoprolol pushes - Hold home bp meds (amlodipine 10mg  daily)  Hx of Gerd - C/w home meds: Pantoprazole 40mg  daily  DVT prophx: Lovenox Diet: Regular Bowel: N/A Code: Full  Dispo: Anticipated discharge in approximately 3-4 day(s).   Mosetta Anis, MD 11/07/2018, 11:05 AM Pager: [phone]

## 2018-11-07 NOTE — Consult Note (Addendum)
Cardiology Consultation:   Patient ID: Richard [name]; [id number]; [birth year]   Admit date: 11/05/2018 Date of Consult: 11/07/2018  Primary Care Provider: Antony Blackbird, MD Primary Cardiologist: Jenkins Rouge, MD New Primary Electrophysiologist:  None   Patient Profile:   Richard [name] is a 53 y.o. male with a hx of ETOH abuse and w/d, colon CA s/p surgery 2007 w/out follow up, PUD, GERD, ETOH pancreatitis, chronic abd pain, HTN, who is being seen today for the evaluation of SVT at the request of Dr Beryle Beams.  History of Present Illness:   Richard [name] was admitted 02/03 for abd pain and alcoholic ketoacidosis, Ativan per CIWA protocol. Intermittently hypoglycemic (problems w/ this in the past).  Early this am, pt HR spiked to 190s, IV metoprolol given and HR improved. Cards asked to see.   Richard [name] responds to questions, but his speech is slurred and the answers are not necessarily responsive.  This is at least partly secondary to the Ativan being given per protocol.  He is not able to provide much information.  He denies chest pain.  He complains of some shoulder discomfort, not able to describe very well but it seems to be either related to him sleeping on his left side or lifting something heavy.  He does not seem aware of his elevated heart rate.  He denies shortness of breath and is not having abdominal pain at this time.   Past Medical History:  Diagnosis Date  . Acid reflux   . Colon cancer ([place])   . ETOH abuse   . HTN (hypertension)   . Pancreatitis 08/2018  . PUD (peptic ulcer disease)     Past Surgical History:  Procedure Laterality Date  . LAPAROSCOPIC SIGMOID COLECTOMY  2007     Prior to Admission medications   Medication Sig Start Date End Date Taking? Authorizing Provider  ondansetron (ZOFRAN) 4 MG tablet Take 1 tablet (4 mg total) by mouth every 8 (eight) hours as needed for nausea or vomiting. 09/28/18  Yes Fulp, Cammie, MD  pantoprazole (PROTONIX) 40 MG  tablet Take 1 tablet (40 mg total) by mouth 2 (two) times daily. To reduce stomach acid Patient taking differently: Take 40 mg by mouth daily. To reduce stomach acid 09/28/18  Yes Fulp, Cammie, MD  amLODipine (NORVASC) 10 MG tablet Take 1 tablet (10 mg total) by mouth daily. To lower blood pressure Patient not taking: Reported on 11/05/2018 09/28/18 10/28/18  Fulp, Ander Gaster, MD  doxycycline (VIBRA-TABS) 100 MG tablet Take 1 tablet (100 mg total) by mouth 2 (two) times daily. Patient not taking: Reported on 11/05/2018 09/28/18   Antony Blackbird, MD  folic acid (FOLVITE) 1 MG tablet Take 1 tablet (1 mg total) by mouth daily. Patient not taking: Reported on 11/05/2018 07/20/18   Fulp, Ander Gaster, MD  thiamine 100 MG tablet Take 1 tablet (100 mg total) by mouth daily. Patient not taking: Reported on 11/05/2018 07/20/18   Antony Blackbird, MD  traMADol (ULTRAM) 50 MG tablet 1-2 pills every 6 hours as needed for pain; eat before taking medication Patient not taking: Reported on 11/05/2018 09/28/18   Antony Blackbird, MD    Inpatient Medications: Scheduled Meds: . adenosine  12 mg Intravenous Once  . diclofenac sodium  2 g Topical QID  . enoxaparin (LOVENOX) injection  40 mg Subcutaneous Q24H  . feeding supplement (ENSURE ENLIVE)  237 mL Oral BID BM  . folic acid  1 mg Oral Daily  . Influenza vac split quadrivalent PF  0.5 mL Intramuscular Tomorrow-1000  . multivitamin with minerals  1 tablet Oral Daily  . nicotine  21 mg Transdermal Daily  . pantoprazole  40 mg Oral Daily  . thiamine  100 mg Oral Daily   Continuous Infusions: . lactated ringers 150 mL/hr at 11/07/18 1107  . potassium PHOSPHATE IVPB (in mmol) 30 mmol (11/07/18 0935)   PRN Meds: acetaminophen **OR** acetaminophen, dextrose, LORazepam, morphine injection, promethazine  Allergies:    Allergies  Allergen Reactions  . Aspirin Other (See Comments)    Acid reflux   . Penicillins Hives    Has patient had a PCN reaction causing immediate rash,  facial/tongue/throat swelling, SOB or lightheadedness with hypotension: yes Has patient had a PCN reaction causing severe rash involving mucus membranes or skin necrosis: no Has patient had a PCN reaction that required hospitalization: no Has patient had a PCN reaction occurring within the last 10 years: no If all of the above answers are "NO", then may proceed with Cephalosporin use.     Social History:   Social History   Socioeconomic History  . Marital status: Single    Spouse name: Not on file  . Number of children: Not on file  . Years of education: Not on file  . Highest education level: Not on file  Occupational History  . Not on file  Social Needs  . Financial resource strain: Not on file  . Food insecurity:    Worry: Not on file    Inability: Not on file  . Transportation needs:    Medical: Not on file    Non-medical: Not on file  Tobacco Use  . Smoking status: Current Every Day Smoker    Packs/day: 1.00  . Smokeless tobacco: Never Used  Substance and Sexual Activity  . Alcohol use: Yes    Alcohol/week: 2.0 standard drinks    Types: 1 Cans of beer, 1 Shots of liquor per week    Comment: heavy alcohol abuse a beer and couple of shots a day for past  14 years  . Drug use: No  . Sexual activity: Not Currently  Lifestyle  . Physical activity:    Days per week: Not on file    Minutes per session: Not on file  . Stress: Not on file  Relationships  . Social connections:    Talks on phone: Not on file    Gets together: Not on file    Attends religious service: Not on file    Active member of club or organization: Not on file    Attends meetings of clubs or organizations: Not on file    Relationship status: Not on file  . Intimate partner violence:    Fear of current or ex partner: Not on file    Emotionally abused: Not on file    Physically abused: Not on file    Forced sexual activity: Not on file  Other Topics Concern  . Not on file  Social History  Narrative  . Not on file    Family History:   Family History  Problem Relation Age of Onset  . Colon cancer Father   . Cancer Sister   . CAD Neg Hx   . Stroke Neg Hx   . Diabetes Neg Hx    Family Status:  Family Status  Relation Name Status  . Father  Deceased  . Sister  Deceased  . Neg Hx  (Not Specified)    ROS:  Please see the history of present  illness.  All other ROS reviewed and negative.     Physical Exam/Data:   Vitals:   11/07/18 0945 11/07/18 1013 11/07/18 1035 11/07/18 1136  BP: (!) 104/91 119/88 115/84 101/72  Pulse:      Resp: 18 17    Temp:      TempSrc:      SpO2: 100%  100% 100%  Weight:      Height:        Intake/Output Summary (Last 24 hours) at 11/07/2018 1145 Last data filed at 11/06/2018 1541 Gross per 24 hour  Intake 1500 ml  Output 575 ml  Net 925 ml   Filed Weights   11/06/18 1100  Weight: 56.7 kg   Body mass index is 16.95 kg/m.  General:  Well developed, thin African-American male, in no acute distress HEENT: normal for age Lymph: no adenopathy Neck: External jugular is elevated. Endocrine:  No thryomegaly Vascular: No carotid bruits; 4/4 extremity pulses 2+, without bruits  Cardiac:  normal S1, S2; rapid RRR; soft murmur  Lungs:  clear to auscultation bilaterally, no wheezing, rhonchi or rales  Abd: soft, nontender, bowel sounds are present Ext: no edema Musculoskeletal:  No deformities, BUE and BLE strength weak but equal Skin: warm and dry  Neuro:  CNs 2-12 intact, no focal abnormalities noted Psych:  Normal affect   EKG:  The EKG was personally reviewed and demonstrates: Initial ECG 11/05/2018 is sinus rhythm, heart rate 77, no acute ischemic changes, QT/QTc 440/498.  ECG later on 2/3 is sinus rhythm, heart rate 76, QT/QTc now 462/519. ECG 11/07/2018 is SVT, heart rate 177, diffuse ST depression  Telemetry:  Telemetry was personally reviewed and demonstrates: Sinus tach with rates occasionally spiking up greater than 170  versus sinus tach and SVT  Relevant CV Studies:  None  Laboratory Data:  Chemistry Recent Labs  Lab 11/05/18 1407 11/06/18 0342 11/07/18 0418  NA 137 134* 135  K 3.6 3.0* 3.2*  CL 106 95* 97*  CO2 16* 24 23  GLUCOSE 85 130* 99  BUN 6 <5* <5*  CREATININE 0.86 0.89 0.73  CALCIUM 7.8* 8.8* 9.3  GFRNONAA >60 >60 >60  GFRAA >60 >60 >60  ANIONGAP 15 15 15     Lab Results  Component Value Date   ALT 23 11/07/2018   AST 84 (H) 11/07/2018   ALKPHOS 91 11/07/2018   BILITOT 2.1 (H) 11/07/2018   Hematology Recent Labs  Lab 11/05/18 0529 11/06/18 0342  WBC 17.6* 7.6  RBC 4.05* 4.22  HGB 12.6* 12.7*  HCT 39.9 37.8*  MCV 98.5 89.6  MCH 31.1 30.1  MCHC 31.6 33.6  RDW 18.6* 17.5*  PLT 232 174   Cardiac Enzymes Recent Labs  Lab 11/05/18 0529  TROPONINI <0.03   TSH:  Lab Results  Component Value Date   TSH 1.334 08/21/2016   Lipids: Lab Results  Component Value Date   CHOL 97 05/04/2018   HDL 35 (L) 05/04/2018   LDLCALC 49 05/04/2018   TRIG 63 05/04/2018   CHOLHDL 2.8 05/04/2018   HgbA1c:No results found for: HGBA1C Magnesium:  Magnesium  Date Value Ref Range Status  11/07/2018 1.3 (L) 1.7 - 2.4 mg/dL Final    Comment:    Performed at [facility], [place] [address] [place], [place] [address]     Radiology/Studies:  Dg Chest 2 View  Result Date: 11/05/2018 CLINICAL DATA:  Chest pain and cough EXAM: CHEST - 2 VIEW COMPARISON:  06/28/2018 FINDINGS: Cardiac shadow is stable.  The lungs are well aerated bilaterally. No focal infiltrate or sizable effusion is seen. No bony abnormality is noted. IMPRESSION: No acute abnormality noted. Electronically Signed   By: Inez Catalina M.D.   On: 11/05/2018 09:20   Ct Abdomen Pelvis W Contrast  Result Date: 11/05/2018 CLINICAL DATA:  Nausea and vomiting with abdominal pain. History of colon carcinoma EXAM: CT ABDOMEN AND PELVIS WITH CONTRAST TECHNIQUE: Multidetector CT imaging of the abdomen and pelvis was performed  using the standard protocol following bolus administration of intravenous contrast. CONTRAST:  172mL OMNIPAQUE IOHEXOL 300 MG/ML  SOLN COMPARISON:  June 27, 2018 FINDINGS: Lower chest: There are foci of scarring and atelectatic change in lung bases. There is lower lobe bronchiectatic change is well. Hepatobiliary: There is hepatic steatosis. No focal liver lesions are demonstrable on this study. There is mild fatty sparing in the left lobe. Gallbladder wall is not appreciably thickened. There is no biliary duct dilatation. Pancreas: There is no appreciable pancreatic mass or inflammatory focus. Spleen: No splenic lesions are evident. Adrenals/Urinary Tract: Adrenals bilaterally appear unremarkable. Kidneys bilaterally show no evident mass or hydronephrosis on either side. There is no evident renal or ureteral calculus on either side. Urinary bladder is midline with wall thickness within normal limits. Stomach/Bowel: Patient is status post partial colectomy. Postoperative changes noted on the left with anastomosis patent. There is stool in portions of remaining colon. There is no appreciable bowel wall or mesenteric thickening. There is no evident bowel obstruction. There is no appreciable free air or portal venous air. Vascular/Lymphatic: There is aortoiliac atherosclerosis. Major mesenteric arterial vessels appear patent. No adenopathy is appreciable in the abdomen or pelvis. Reproductive: Prostate and seminal vesicles are normal in size and contour. No pelvic masses appreciable. Other: No abscess or ascites is evident in the abdomen or pelvis. Musculoskeletal: There are foci of degenerative change in the lower thoracic and lumbar spine regions. There are no blastic or lytic bone lesions. There is no intramuscular or abdominal wall lesion. IMPRESSION: 1. Status post subtotal colectomy. Anastomosis patent in the left abdomen. No evident bowel obstruction currently. No abscess evident in the abdomen or pelvis.  2.  Hepatic steatosis. 3.  Aortoiliac atherosclerosis. 4.  Mild lower lobe bronchiectatic change. Aortic Atherosclerosis (ICD10-I70.0). Electronically Signed   By: Lowella Grip III M.D.   On: 11/05/2018 07:41    Assessment and Plan:   1. SVT: His heart rate has been consistently elevated since admission and went slower, it is clearly sinus tach. -Although he does spike a higher heart rate at times and has been consistently elevated, the rate is variable and slows with the metoprolol so is not clearly a reentrant pathway. -Spoke with Dr Johnsie Cancel, he recommends a one-time dose of adenosine 12 mg (ordered) for possible conversion and/or diagnostic purposes. -Continue beta-blocker, it is ordered as metoprolol 5 mg every 5 minutes as needed - After the adenosine, decide on additional medications and evaluation. -MD advise if an echo would be helpful  Otherwise, per IM. He is getting nutritional supplements, electrolyte supplements, and Ativan per CIWA protocol Principal Problem:   High anion gap metabolic acidosis Active Problems:   Hepatic steatosis   Alcohol abuse   Alcoholic ketoacidosis   Abdominal pain     For questions or updates, please contact [facility] Please consult [email or web address] for contact info under Cardiology/STEMI.   SignedRosaria Ferries, PA-C  11/07/2018 11:45 AM  Patient examined chart reviewed. Discussed care with nurse and PA. Telemetry and ECG;s reviewed  Appears to have atrial tachycardia in setting of DT;s. We were prepared to give him adenosine and broke into sinus tachycardia with clear P waves. Appears to have long RP tachycardia. Continue oral beta blocker and PRN iv for faster rates Would prefer not to have him hooked up to continuous iv infusion due to risk of taking iv out. Exam with thin black male confused but not so agitated Clear lungs no murmur soft abdomen no edema. Check TSH and TTE supplement electrolytes per primary service
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
## 2018-11-08 ENCOUNTER — Encounter (HOSPITAL_COMMUNITY): Payer: Self-pay

## 2018-11-08 ENCOUNTER — Inpatient Hospital Stay (HOSPITAL_COMMUNITY): Payer: Self-pay

## 2018-11-08 DIAGNOSIS — I479 Paroxysmal tachycardia, unspecified: Secondary | ICD-10-CM

## 2018-11-08 LAB — CBC
HCT: 33.5 % — ABNORMAL LOW (ref 39.0–52.0)
Hemoglobin: 11.2 g/dL — ABNORMAL LOW (ref 13.0–17.0)
MCH: 31.1 pg (ref 26.0–34.0)
MCHC: 33.4 g/dL (ref 30.0–36.0)
MCV: 93.1 fL (ref 80.0–100.0)
Platelets: 142 10*3/uL — ABNORMAL LOW (ref 150–400)
RBC: 3.6 MIL/uL — ABNORMAL LOW (ref 4.22–5.81)
RDW: 17 % — ABNORMAL HIGH (ref 11.5–15.5)
WBC: 5 10*3/uL (ref 4.0–10.5)
nRBC: 0 % (ref 0.0–0.2)

## 2018-11-08 LAB — COMPREHENSIVE METABOLIC PANEL
ALT: 29 U/L (ref 0–44)
AST: 110 U/L — ABNORMAL HIGH (ref 15–41)
Albumin: 2.6 g/dL — ABNORMAL LOW (ref 3.5–5.0)
Alkaline Phosphatase: 68 U/L (ref 38–126)
Anion gap: 10 (ref 5–15)
BUN: <5 mg/dL — ABNORMAL LOW (ref 6–20)
CHLORIDE: 108 mmol/L (ref 98–111)
CO2: 22 mmol/L (ref 22–32)
Calcium: 8.1 mg/dL — ABNORMAL LOW (ref 8.9–10.3)
Creatinine, Ser: 0.53 mg/dL — ABNORMAL LOW (ref 0.61–1.24)
GFR calc non Af Amer: >60 mL/min (ref >60)
Glucose, Bld: 102 mg/dL — ABNORMAL HIGH (ref 70–99)
Potassium: 4.1 mmol/L (ref 3.5–5.1)
Sodium: 140 mmol/L (ref 135–145)
Total Bilirubin: 1.7 mg/dL — ABNORMAL HIGH (ref 0.3–1.2)
Total Protein: 6 g/dL — ABNORMAL LOW (ref 6.5–8.1)

## 2018-11-08 LAB — ECHOCARDIOGRAM COMPLETE
Height: 72 in
Weight: 2000 oz

## 2018-11-08 LAB — MAGNESIUM: MAGNESIUM: 2 mg/dL (ref 1.7–2.4)

## 2018-11-08 LAB — LIPASE, BLOOD: Lipase: 26 U/L (ref 11–51)

## 2018-11-08 LAB — PHOSPHORUS: PHOSPHORUS: 2.7 mg/dL (ref 2.5–4.6)

## 2018-11-08 NOTE — Progress Notes (Signed)
Medicine attending: We appreciate cardiology and critical care assistance. He reverted back to sinus rhythm spontaneously.  He never had to get a dose of adenosine.  He did not require any additional doses of IV beta-blocker overnight.  Currently on maintenance oral metoprolol 50 mg 3 times daily.  He is still having episodes of agitation and confusion now on a Precedex infusion.  He was very sedated when we saw him this morning but easily arousable. Plan per critical care medicine.  Back to medical service when stable.

## 2018-11-08 NOTE — Progress Notes (Signed)
Progress Note  Patient Name: Richard [name] Date of Encounter: 11/08/2018  Primary Cardiologist: Jenkins Rouge, MD    Subjective   Sedated   Inpatient Medications    Scheduled Meds: . chlordiazePOXIDE  25 mg Oral QID   Followed by  . [START ON 11/09/2018] chlordiazePOXIDE  25 mg Oral TID   Followed by  . [START ON 11/10/2018] chlordiazePOXIDE  25 mg Oral BH-qamhs   Followed by  . [START ON 11/11/2018] chlordiazePOXIDE  25 mg Oral Daily  . diclofenac sodium  2 g Topical QID  . enoxaparin (LOVENOX) injection  40 mg Subcutaneous Q24H  . feeding supplement (ENSURE ENLIVE)  237 mL Oral BID BM  . folic acid  1 mg Oral Daily  . Influenza vac split quadrivalent PF  0.5 mL Intramuscular Tomorrow-1000  . metoprolol tartrate  50 mg Oral Q8H  . multivitamin with minerals  1 tablet Oral Daily  . nicotine  21 mg Transdermal Daily  . pantoprazole  40 mg Oral Daily  . thiamine  100 mg Oral Daily   Continuous Infusions: . dexmedetomidine (PRECEDEX) IV infusion 0.8 mcg/kg/hr (11/08/18 0800)  . lactated ringers 150 mL/hr at 11/08/18 0800   PRN Meds: acetaminophen **OR** acetaminophen, chlordiazePOXIDE, dextrose, hydrOXYzine, loperamide, LORazepam, metoprolol tartrate, ondansetron   Vital Signs    Vitals:   11/08/18 0500 11/08/18 0600 11/08/18 0700 11/08/18 0800  BP: 123/87 (!) 125/97 (!) 128/92 (!) 135/96  Pulse: (!) 56 (!) 56 (!) 58 (!) 58  Resp: 18 14 19 18   Temp:      TempSrc:      SpO2: 96% 98% 96% 97%  Weight:      Height:        Intake/Output Summary (Last 24 hours) at 11/08/2018 0811 Last data filed at 11/08/2018 0800 Gross per 24 hour  Intake 4712.08 ml  Output 2100 ml  Net 2612.08 ml   Last 3 Weights 11/06/2018 09/28/2018 08/21/2018  Weight (lbs) 125 lb 123 lb 128 lb  Weight (kg) 56.7 kg 55.792 kg 58.06 kg      Telemetry    NSR rates 60 bpm - Personally Reviewed  ECG    ST no acute ST changes - Personally Reviewed  Physical Exam  Thin chronically ill black male    GEN: No acute distress.   Neck: No JVD Cardiac: RRR, no murmurs, rubs, or gallops.  Respiratory: Clear to auscultation bilaterally. GI: Soft, nontender, non-distended  MS: No edema; No deformity. Neuro:  Nonfocal  Psych: Normal affect   Labs    Chemistry Recent Labs  Lab 11/06/18 0342 11/07/18 0418 11/07/18 1805 11/08/18 0614  NA 134* 135 137 140  K 3.0* 3.2* 4.2 4.1  CL 95* 97* 100 108  CO2 24 23 25 22   GLUCOSE 130* 99 79 102*  BUN <5* <5* 5* <5*  CREATININE 0.89 0.73 0.71 0.53*  CALCIUM 8.8* 9.3 9.4 8.1*  PROT 7.4 7.4  --  6.0*  ALBUMIN 3.3* 3.3*  --  2.6*  AST 97* 84*  --  110*  ALT 22 23  --  29  ALKPHOS 108 91  --  68  BILITOT 1.3* 2.1*  --  1.7*  GFRNONAA >60 >60 >60 >60  GFRAA >60 >60 >60 >60  ANIONGAP 15 15 12 10      Hematology Recent Labs  Lab 11/05/18 0529 11/06/18 0342 11/08/18 0614  WBC 17.6* 7.6 5.0  RBC 4.05* 4.22 3.60*  HGB 12.6* 12.7* 11.2*  HCT 39.9 37.8* 33.5*  MCV 98.5 89.6 93.1  MCH 31.1 30.1 31.1  MCHC 31.6 33.6 33.4  RDW 18.6* 17.5* 17.0*  PLT 232 174 142*    Cardiac Enzymes Recent Labs  Lab 11/05/18 0529  TROPONINI <0.03   No results for input(s): TROPIPOC in the last 168 hours.   BNPNo results for input(s): BNP, PROBNP in the last 168 hours.   DDimer No results for input(s): DDIMER in the last 168 hours.   Radiology    No results found.  Cardiac Studies   TTE pending   Patient Profile     53 y.o. male with DT;s ETOH abuse with tachycardia   Assessment & Plan    Atrial tachycardia:  Resolved with Rx of DT;s Resolved with beta blocker continue q 8 hours ETOH:  DT;s per primary service sedated this am combative last night      For questions or updates, please contact [place] Please consult [email or web address] for contact info under        Signed, Jenkins Rouge, MD  11/08/2018, 8:11 AM

## 2018-11-08 NOTE — Progress Notes (Signed)
NAME:  Richard [name], MRN:  [id number], DOB:  02-01-1966, LOS: 3 ADMISSION DATE:  11/05/2018, CONSULTATION DATE:  2/5 REFERRING MD: Waymon Budge, CHIEF COMPLAINT:  Alcohol withdrawal    Brief History   53 year old male patient with a history of alcohol abuse.  Last drink about 24 hours prior to admission on 2/3.  Developing worsening tachycardia, agitation, combativeness and delirium which progressed from 2/4 into 2/5.  Pulmonary/critical care asked to see on 2/5 for consideration of Precedex infusion   Past Medical History  HETOH abuse, ETOH related pancreatitis, HTN, chronic abd pain,  colon cancer (s/p resection but no f/u)  Significant Hospital Events   2/3 admitted with mild pancreatitis, abdominal pain, and alcoholic ketoacidosis 2/4: Pain better began to have episodes of supraventricular tachycardia, worsening delirium, and combativeness.  Nursing staff noting he seemed to get more confused with lorazepam per CIWA protocol 2/5: Pulmonary asked to see and consider for possible Precedex infusion  Consults:  Pulmonary 2/5 for Precedex Cardiology 2/4 for SVT  Procedures:    Significant Diagnostic Tests:  Echo 2/5  Micro Data:  BCx 2/3> NGTD MRSA 1/5> neg  Antimicrobials:   Interim history/subjective:  NAEO, improved agitation with precedex    Objective   Blood pressure (!) 141/91, pulse (!) 57, temperature (!) 97.4 F (36.3 C), temperature source Oral, resp. rate 18, height 6' (1.829 m), weight 56.7 kg, SpO2 99 %.        Intake/Output Summary (Last 24 hours) at 11/08/2018 1233 Last data filed at 11/08/2018 1200 Gross per 24 hour  Intake 4778.63 ml  Output 2300 ml  Net 2478.63 ml   Filed Weights   11/06/18 1100  Weight: 56.7 kg    Examination: General: thin appearing adult male, NAD  HENT: MMM NCAT anicteric sclera trachea midline  Lungs: CTA bilaterally, no accessory muscle recruitment  Cardiovascular: RRR s1s2 no r/g/m, 2+ radial pulses  Abdomen: soft,  non-tender non-distended  Extremities: symmetrical bulk and tone. No edema  Neuro: Sedated. Awakens to loud voice. Follows commands. Is oriented to self.  Skin: clean, dry, warm, intact  Resolved Hospital Problem list   Anion gap metabolic acidosis Hypotension  Assessment & Plan:  Acute metabolic encephalopathy/delirium tremens -Seemingly worse with with Lorazepam per nursing staff Plan Initiate Precedex and Librium overlap Eventually transition to clonidine taper off Precedex in the next 24 to 48 hours Continue CIWA nursing assessment  Continue close observation for safety Continue thiamine and folate Continue to trend BMP, mag phos. Replace electrolytes PRN Continue mIVF   Supraventricular tachycardia -Likely exacerbated by withdrawal Plan Cardiology following NSR now with treatment of DTs, no acute ST changes.  Continuing current lopressor  ECHO completed, follow up for results   Acute on chronic pancreatitis Plan Follow-up lipase  Fluid and electrolyte imbalance:  Hypomagnesemia, Hypophosphatemia, hypokalemia Plan Trend BMP, mag/phos Follow-up and replace further as necessary  History of gastroesophageal reflux disease Plan Continue PPI  Rest per primary  Best practice:  Diet: NPO  Pain/Anxiety/Delirium protocol (if indicated): Precedex, PRN ativan. ETOH w/d started 2/5 VAP protocol (if indicated): na DVT prophylaxis: LMWH GI prophylaxis: PPI Glucose control: na Mobility: BR  Code Status: full code Family Communication: pending Disposition: Continue ICU level of care, precedex   Labs   CBC: Recent Labs  Lab 11/05/18 0529 11/06/18 0342 11/08/18 0614  WBC 17.6* 7.6 5.0  NEUTROABS 14.9*  --   --   HGB 12.6* 12.7* 11.2*  HCT 39.9 37.8* 33.5*  MCV 98.5 89.6 93.1  PLT 232 174 142*    Basic Metabolic Panel: Recent Labs  Lab 11/05/18 0933 11/05/18 1407 11/06/18 0342 11/07/18 0418 11/07/18 1805 11/08/18 0614  NA 140 137 134* 135 137 140  K  3.7 3.6 3.0* 3.2* 4.2 4.1  CL 106 106 95* 97* 100 108  CO2 14* 16* 24 23 25 22   GLUCOSE 95 85 130* 99 79 102*  BUN 9 6 <5* <5* 5* <5*  CREATININE 1.00 0.86 0.89 0.73 0.71 0.53*  CALCIUM 7.9* 7.8* 8.8* 9.3 9.4 8.1*  MG 1.7  --  1.3* 1.3* 2.5* 2.0  PHOS 2.2*  --  <1.0* 1.8* 2.9 2.7   GFR: Estimated Creatinine Clearance: 88.6 mL/min (A) (by C-G formula based on SCr of 0.53 mg/dL (L)). Recent Labs  Lab 11/05/18 0529 11/05/18 0654 11/05/18 1407 11/06/18 0342 11/08/18 0614  WBC 17.6*  --   --  7.6 5.0  LATICACIDVEN 3.1* 2.2* 1.7  --   --     Liver Function Tests: Recent Labs  Lab 11/05/18 0529 11/06/18 0342 11/07/18 0418 11/08/18 0614  AST 81* 97* 84* 110*  ALT 23 22 23 29   ALKPHOS 100 108 91 68  BILITOT 1.7* 1.3* 2.1* 1.7*  PROT 8.1 7.4 7.4 6.0*  ALBUMIN 3.6 3.3* 3.3* 2.6*   Recent Labs  Lab 11/05/18 0529 11/08/18 0614  LIPASE 92* 26   No results for input(s): AMMONIA in the last 168 hours.  ABG    Component Value Date/Time   HCO3 17.5 (L) 08/14/2018 0015   ACIDBASEDEF 6.9 (H) 08/14/2018 0015   O2SAT 81.3 08/14/2018 0015     Coagulation Profile: No results for input(s): INR, PROTIME in the last 168 hours.  Cardiac Enzymes: Recent Labs  Lab 11/05/18 0529  TROPONINI <0.03    HbA1C: No results found for: HGBA1C  CBG: Recent Labs  Lab 11/05/18 2229 11/06/18 0110 11/06/18 0632 11/06/18 1221 11/07/18 0930  GLUCAP 129* 118* 138* 160* 102*   Critical care time: 30 minutes      Eliseo Gum MSN, AGACNP-BC [place] 11/08/2018, 12:33 PM

## 2018-11-08 NOTE — Progress Notes (Signed)
  Echocardiogram 2D Echocardiogram has been performed.  Richard [name] 11/08/2018, 11:33 AM

## 2018-11-09 DIAGNOSIS — I426 Alcoholic cardiomyopathy: Secondary | ICD-10-CM

## 2018-11-09 LAB — BASIC METABOLIC PANEL
Anion gap: 13 (ref 5–15)
BUN: <5 mg/dL — ABNORMAL LOW (ref 6–20)
CO2: 23 mmol/L (ref 22–32)
Calcium: 9.4 mg/dL (ref 8.9–10.3)
Chloride: 101 mmol/L (ref 98–111)
Creatinine, Ser: 0.59 mg/dL — ABNORMAL LOW (ref 0.61–1.24)
GFR calc Af Amer: >60 mL/min (ref >60)
GFR calc non Af Amer: >60 mL/min (ref >60)
Glucose, Bld: 101 mg/dL — ABNORMAL HIGH (ref 70–99)
Potassium: 3.8 mmol/L (ref 3.5–5.1)
Sodium: 137 mmol/L (ref 135–145)

## 2018-11-09 LAB — PHOSPHORUS: Phosphorus: 3.5 mg/dL (ref 2.5–4.6)

## 2018-11-09 LAB — MAGNESIUM: Magnesium: 1.5 mg/dL — ABNORMAL LOW (ref 1.7–2.4)

## 2018-11-09 MED ORDER — LOSARTAN POTASSIUM 50 MG PO TABS
50.0000 mg | ORAL_TABLET | Freq: Every day | ORAL | Status: DC
  Administered 2018-11-09 – 2018-11-10 (×2): 50 mg via ORAL
  Filled 2018-11-09 (×2): qty 1

## 2018-11-09 MED ORDER — CARVEDILOL 12.5 MG PO TABS
12.5000 mg | ORAL_TABLET | Freq: Two times a day (BID) | ORAL | Status: DC
  Administered 2018-11-09 – 2018-11-10 (×3): 12.5 mg via ORAL
  Filled 2018-11-09 (×4): qty 1

## 2018-11-09 MED ORDER — FENTANYL CITRATE (PF) 100 MCG/2ML IJ SOLN
25.0000 ug | Freq: Once | INTRAMUSCULAR | Status: AC
  Administered 2018-11-09: 25 ug via INTRAVENOUS
  Filled 2018-11-09: qty 2

## 2018-11-09 MED ORDER — MAGNESIUM SULFATE 2 GM/50ML IV SOLN
2.0000 g | Freq: Once | INTRAVENOUS | Status: AC
  Administered 2018-11-09: 2 g via INTRAVENOUS
  Filled 2018-11-09: qty 50

## 2018-11-09 NOTE — Progress Notes (Signed)
Progress Note  Patient Name: Richard [name] Date of Encounter: 11/09/2018  Primary Cardiologist: Jenkins Rouge, MD    Subjective   Lethargic   Inpatient Medications    Scheduled Meds: . chlordiazePOXIDE  25 mg Oral QID   Followed by  . chlordiazePOXIDE  25 mg Oral TID   Followed by  . [START ON 11/10/2018] chlordiazePOXIDE  25 mg Oral BH-qamhs   Followed by  . [START ON 11/11/2018] chlordiazePOXIDE  25 mg Oral Daily  . diclofenac sodium  2 g Topical QID  . enoxaparin (LOVENOX) injection  40 mg Subcutaneous Q24H  . feeding supplement (ENSURE ENLIVE)  237 mL Oral BID BM  . folic acid  1 mg Oral Daily  . metoprolol tartrate  50 mg Oral Q8H  . multivitamin with minerals  1 tablet Oral Daily  . nicotine  21 mg Transdermal Daily  . pantoprazole  40 mg Oral Daily  . thiamine  100 mg Oral Daily   Continuous Infusions: . dexmedetomidine (PRECEDEX) IV infusion 0.4 mcg/kg/hr (11/09/18 0600)  . lactated ringers 75 mL/hr at 11/09/18 0600   PRN Meds: acetaminophen **OR** acetaminophen, chlordiazePOXIDE, dextrose, hydrOXYzine, loperamide, LORazepam, metoprolol tartrate, ondansetron   Vital Signs    Vitals:   11/09/18 0411 11/09/18 0500 11/09/18 0600 11/09/18 0738  BP:  (!) 137/92 128/87   Pulse:  (!) 56 (!) 56   Resp:  20 19   Temp: (!) 97.5 F (36.4 C)   97.8 F (36.6 C)  TempSrc: Oral   Oral  SpO2:  96% 96%   Weight:      Height:        Intake/Output Summary (Last 24 hours) at 11/09/2018 0821 Last data filed at 11/09/2018 0600 Gross per 24 hour  Intake 2790.35 ml  Output 2750 ml  Net 40.35 ml   Last 3 Weights 11/06/2018 09/28/2018 08/21/2018  Weight (lbs) 125 lb 123 lb 128 lb  Weight (kg) 56.7 kg 55.792 kg 58.06 kg      Telemetry    NSR rates 60 bpm - Personally Reviewed  ECG    ST no acute ST changes - Personally Reviewed  Physical Exam  Thin chronically ill black male  GEN: No acute distress.   Neck: No JVD Cardiac: RRR, no murmurs, rubs, or gallops.    Respiratory: Clear to auscultation bilaterally. GI: Soft, nontender, non-distended  MS: No edema; No deformity. Neuro:  Nonfocal  Psych: Normal affect   Labs    Chemistry Recent Labs  Lab 11/06/18 0342 11/07/18 0418 11/07/18 1805 11/08/18 0614 11/09/18 0708  NA 134* 135 137 140 137  K 3.0* 3.2* 4.2 4.1 3.8  CL 95* 97* 100 108 101  CO2 24 23 25 22 23   GLUCOSE 130* 99 79 102* 101*  BUN <5* <5* 5* <5* <5*  CREATININE 0.89 0.73 0.71 0.53* 0.59*  CALCIUM 8.8* 9.3 9.4 8.1* 9.4  PROT 7.4 7.4  --  6.0*  --   ALBUMIN 3.3* 3.3*  --  2.6*  --   AST 97* 84*  --  110*  --   ALT 22 23  --  29  --   ALKPHOS 108 91  --  68  --   BILITOT 1.3* 2.1*  --  1.7*  --   GFRNONAA >60 >60 >60 >60 >60  GFRAA >60 >60 >60 >60 >60  ANIONGAP 15 15 12 10 13      Hematology Recent Labs  Lab 11/05/18 0529 11/06/18 0342 11/08/18 0614  WBC  17.6* 7.6 5.0  RBC 4.05* 4.22 3.60*  HGB 12.6* 12.7* 11.2*  HCT 39.9 37.8* 33.5*  MCV 98.5 89.6 93.1  MCH 31.1 30.1 31.1  MCHC 31.6 33.6 33.4  RDW 18.6* 17.5* 17.0*  PLT 232 174 142*    Cardiac Enzymes Recent Labs  Lab 11/05/18 0529  TROPONINI <0.03   No results for input(s): TROPIPOC in the last 168 hours.   BNPNo results for input(s): BNP, PROBNP in the last 168 hours.   DDimer No results for input(s): DDIMER in the last 168 hours.   Radiology    No results found.  Cardiac Studies   TTE  EF 30-35% diffuse hypokinesis   Patient Profile     53 y.o. 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

## 2018-11-09 NOTE — Progress Notes (Addendum)
NAME:  Richard [name], MRN:  [id number], DOB:  Oct 25, 1965, LOS: 4 ADMISSION DATE:  11/05/2018, CONSULTATION DATE:  2/5 REFERRING MD: Waymon Budge, CHIEF COMPLAINT:  Alcohol withdrawal    Brief History   53 year old male patient with a history of alcohol abuse.  Last drink about 24 hours prior to admission on 2/3.  Developing worsening tachycardia, agitation, combativeness and delirium which progressed from 2/4 into 2/5.  Pulmonary/critical care asked to see on 2/5 for consideration of Precedex infusion  Past Medical History  HETOH abuse, ETOH related pancreatitis, HTN, chronic abd pain,  colon cancer (s/p resection but no f/u)  Significant Hospital Events   2/3 admitted with mild pancreatitis, abdominal pain, and alcoholic ketoacidosis 2/4: Pain better began to have episodes of supraventricular tachycardia, worsening delirium, and combativeness.  Nursing staff noting he seemed to get more confused with lorazepam per CIWA protocol 2/5: Pulmonary asked to see and consider for possible Precedex infusion  Consults:  Pulmonary 2/5 for Precedex Cardiology 2/4 for SVT  Procedures:    Significant Diagnostic Tests:  Echo 2/5  Micro Data:  BCx 2/3> NGTD MRSA 1/5> neg  Antimicrobials:   Interim history/subjective:  Mental status improved, eating breakfast in bed Plaints today Off Precedex for the past 2 hours.   Objective   Blood pressure (!) 139/95, pulse 69, temperature 97.8 F (36.6 C), temperature source Oral, resp. rate (!) 24, height 6' (1.829 m), weight 56.7 kg, SpO2 99 %.        Intake/Output Summary (Last 24 hours) at 11/09/2018 1029 Last data filed at 11/09/2018 1000 Gross per 24 hour  Intake 2747.54 ml  Output 2800 ml  Net -52.46 ml   Filed Weights   11/06/18 1100  Weight: 56.7 kg   Examination: Blood pressure (!) 139/95, pulse 69, temperature 97.8 F (36.6 C), temperature source Oral, resp. rate (!) 24, height 6' (1.829 m), weight 56.7 kg, SpO2 99 %. Gen:      No  acute distress HEENT:  EOMI, sclera anicteric Neck:     No masses; no thyromegaly Lungs:    Clear to auscultation bilaterally; normal respiratory effort CV:         Regular rate and rhythm; no murmurs Abd:      + bowel sounds; soft, non-tender; no palpable masses, no distension Ext:    No edema; adequate peripheral perfusion Skin:      Warm and dry; no rash Neuro: alert and oriented x 3 Psych: normal mood and affect  Resolved Hospital Problem list   Anion gap metabolic acidosis Hypotension  Assessment & Plan:  Acute metabolic encephalopathy/delirium tremens Plan Off Precedex Continue Librium CIWA protocol Continue thiamine, folate  Supraventricular tachycardia-Likely exacerbated by withdrawal Dilated cardiomyopathy ? Alcohol related Plan Normal sinus rhythm now Continue telemetry monitoring. Lopressor as needed On Coreg, losartan per cardiology.  Acute on chronic pancreatitis Plan Supportive care  Fluid and electrolyte imbalance:  Hypomagnesemia, Hypophosphatemia, hypokalemia Plan Trend BMP, mag/phos Follow-up and replace further as necessary  History of gastroesophageal reflux disease Plan Continue PPI  Best practice:  Diet: P.o. diet Pain/Anxiety/Delirium protocol (if indicated): PRN ativan. ETOH w/d started 2/5 VAP protocol (if indicated): na DVT prophylaxis: LMWH GI prophylaxis: PPI Glucose control: na Mobility: BR  Code Status: full code Family Communication: Patient updated at bedside. Disposition: Out of ICU  Labs   CBC: Recent Labs  Lab 11/05/18 0529 11/06/18 0342 11/08/18 0614  WBC 17.6* 7.6 5.0  NEUTROABS 14.9*  --   --   HGB  12.6* 12.7* 11.2*  HCT 39.9 37.8* 33.5*  MCV 98.5 89.6 93.1  PLT 232 174 142*    Basic Metabolic Panel: Recent Labs  Lab 11/06/18 0342 11/07/18 0418 11/07/18 1805 11/08/18 0614 11/09/18 0708  NA 134* 135 137 140 137  K 3.0* 3.2* 4.2 4.1 3.8  CL 95* 97* 100 108 101  CO2 24 23 25 22 23   GLUCOSE 130* 99  79 102* 101*  BUN <5* <5* 5* <5* <5*  CREATININE 0.89 0.73 0.71 0.53* 0.59*  CALCIUM 8.8* 9.3 9.4 8.1* 9.4  MG 1.3* 1.3* 2.5* 2.0 1.5*  PHOS <1.0* 1.8* 2.9 2.7 3.5   GFR: Estimated Creatinine Clearance: 88.6 mL/min (A) (by C-G formula based on SCr of 0.59 mg/dL (L)). Recent Labs  Lab 11/05/18 0529 11/05/18 0654 11/05/18 1407 11/06/18 0342 11/08/18 0614  WBC 17.6*  --   --  7.6 5.0  LATICACIDVEN 3.1* 2.2* 1.7  --   --     Liver Function Tests: Recent Labs  Lab 11/05/18 0529 11/06/18 0342 11/07/18 0418 11/08/18 0614  AST 81* 97* 84* 110*  ALT 23 22 23 29   ALKPHOS 100 108 91 68  BILITOT 1.7* 1.3* 2.1* 1.7*  PROT 8.1 7.4 7.4 6.0*  ALBUMIN 3.6 3.3* 3.3* 2.6*   Recent Labs  Lab 11/05/18 0529 11/08/18 0614  LIPASE 92* 26   No results for input(s): AMMONIA in the last 168 hours.  ABG    Component Value Date/Time   HCO3 17.5 (L) 08/14/2018 0015   ACIDBASEDEF 6.9 (H) 08/14/2018 0015   O2SAT 81.3 08/14/2018 0015     Coagulation Profile: No results for input(s): INR, PROTIME in the last 168 hours.  Cardiac Enzymes: Recent Labs  Lab 11/05/18 0529  TROPONINI <0.03    HbA1C: No results found for: HGBA1C  CBG: Recent Labs  Lab 11/05/18 2229 11/06/18 0110 11/06/18 0632 11/06/18 1221 11/07/18 0930  GLUCAP 129* 118* 138* 160* 102*   Raylon Lamson MD [facility] 11/09/2018, 10:38 AM

## 2018-11-09 NOTE — Progress Notes (Signed)
Paged by Critical Care. Richard [name] is off precedex drip and is currently stable to return to general medical floor. IMTS will resume care starting 11/10/18 7am.  - Gilberto Better, PGY1 Pager: [phone]

## 2018-11-10 DIAGNOSIS — I5022 Chronic systolic (congestive) heart failure: Secondary | ICD-10-CM

## 2018-11-10 DIAGNOSIS — I420 Dilated cardiomyopathy: Secondary | ICD-10-CM

## 2018-11-10 DIAGNOSIS — I471 Supraventricular tachycardia, unspecified: Secondary | ICD-10-CM

## 2018-11-10 DIAGNOSIS — R451 Restlessness and agitation: Secondary | ICD-10-CM

## 2018-11-10 DIAGNOSIS — Z85038 Personal history of other malignant neoplasm of large intestine: Secondary | ICD-10-CM

## 2018-11-10 LAB — CULTURE, BLOOD (ROUTINE X 2)
Culture: NO GROWTH
Culture: NO GROWTH

## 2018-11-10 LAB — BASIC METABOLIC PANEL
ANION GAP: 7 (ref 5–15)
BUN: 5 mg/dL — ABNORMAL LOW (ref 6–20)
CO2: 24 mmol/L (ref 22–32)
Calcium: 8.7 mg/dL — ABNORMAL LOW (ref 8.9–10.3)
Chloride: 103 mmol/L (ref 98–111)
Creatinine, Ser: 0.58 mg/dL — ABNORMAL LOW (ref 0.61–1.24)
GFR calc Af Amer: >60 mL/min (ref >60)
GFR calc non Af Amer: >60 mL/min (ref >60)
Glucose, Bld: 92 mg/dL (ref 70–99)
POTASSIUM: 3.7 mmol/L (ref 3.5–5.1)
Sodium: 134 mmol/L — ABNORMAL LOW (ref 135–145)

## 2018-11-10 LAB — CBC
HCT: 31.8 % — ABNORMAL LOW (ref 39.0–52.0)
Hemoglobin: 10.6 g/dL — ABNORMAL LOW (ref 13.0–17.0)
MCH: 30.4 pg (ref 26.0–34.0)
MCHC: 33.3 g/dL (ref 30.0–36.0)
MCV: 91.1 fL (ref 80.0–100.0)
Platelets: 194 10*3/uL (ref 150–400)
RBC: 3.49 MIL/uL — ABNORMAL LOW (ref 4.22–5.81)
RDW: 16.8 % — ABNORMAL HIGH (ref 11.5–15.5)
WBC: 6.4 10*3/uL (ref 4.0–10.5)
nRBC: 0 % (ref 0.0–0.2)

## 2018-11-10 LAB — MAGNESIUM: Magnesium: 1.6 mg/dL — ABNORMAL LOW (ref 1.7–2.4)

## 2018-11-10 LAB — PHOSPHORUS: Phosphorus: 3.3 mg/dL (ref 2.5–4.6)

## 2018-11-10 MED ORDER — PANTOPRAZOLE SODIUM 40 MG PO TBEC: 40.0000 mg | DELAYED_RELEASE_TABLET | Freq: Every day | ORAL | 0 refills | Status: DC

## 2018-11-10 MED ORDER — CARVEDILOL 12.5 MG PO TABS: 12.5000 mg | ORAL_TABLET | Freq: Two times a day (BID) | ORAL | 0 refills | Status: DC

## 2018-11-10 MED ORDER — MAGNESIUM SULFATE 2 GM/50ML IV SOLN: 2.0000 g | Freq: Once | INTRAVENOUS | Status: DC

## 2018-11-10 MED ORDER — LOSARTAN POTASSIUM 50 MG PO TABS: 100.0000 mg | ORAL_TABLET | Freq: Every day | ORAL | Status: DC

## 2018-11-10 MED ORDER — LOSARTAN POTASSIUM 50 MG PO TABS
50.0000 mg | ORAL_TABLET | Freq: Once | ORAL | Status: AC
  Administered 2018-11-10: 50 mg via ORAL
  Filled 2018-11-10: qty 1

## 2018-11-10 MED ORDER — LOSARTAN POTASSIUM 100 MG PO TABS: 100.0000 mg | ORAL_TABLET | Freq: Every day | ORAL | 0 refills | Status: DC

## 2018-11-10 MED ORDER — NALTREXONE HCL 50 MG PO TABS: 50.0000 mg | ORAL_TABLET | Freq: Every day | ORAL | 0 refills | Status: DC

## 2018-11-10 NOTE — Discharge Instructions (Signed)
Richard [name]  You were admitted for nausea, vomiting and abdominal pain. You were found to be severely malnourished with too much acid in your blood from drinking too much alcohol. Here are our recommendations at discharge:  - Please follow up with your Primary Care Provider - Please follow up with a Heart Doctor  - Please start carvedilol 12.5mg  BID with meals - Please start losartan 100mg  daily - Please start naltrexone 50mg  daily - Please try to avoid any more alcohol use  Thank you for choosing [facility]   Alcohol Use Disorder Alcohol use disorder is when your drinking disrupts your daily life. When you have this condition, you drink too much alcohol and you cannot control your drinking. Alcohol use disorder can cause serious problems with your physical health. It can affect your brain, heart, liver, pancreas, immune system, stomach, and intestines. Alcohol use disorder can increase your risk for certain cancers and cause problems with your mental health, such as depression, anxiety, psychosis, delirium, and dementia. People with this disorder risk hurting themselves and others. What are the causes? This condition is caused by drinking too much alcohol over time. It is not caused by drinking too much alcohol only one or two times. Some people with this condition drink alcohol to cope with or escape from negative life events. Others drink to relieve pain or symptoms of mental illness. What increases the risk? You are more likely to develop this condition if:  You have a family history of alcohol use disorder.  Your culture encourages drinking to the point of intoxication, or makes alcohol easy to get.  You had a mood or conduct disorder in childhood.  You have been a victim of abuse.  You are an adolescent and: ? You have poor grades or difficulties in school. ? Your caregivers do not talk to you about saying no to alcohol, or supervise your activities. ? You are impulsive or you  have trouble with self-control. What are the signs or symptoms? Symptoms of this condition include:  Drinkingmore than you want to.  Drinking for longer than you want to.  Trying several times to drink less or to control your drinking.  Spending a lot of time getting alcohol, drinking, or recovering from drinking.  Craving alcohol.  Having problems at work, at school, or at home due to drinking.  Having problems in relationships due to drinking.  Drinking when it is dangerous to drink, such as before driving a car.  Continuing to drink even though you know you might have a physical or mental problem related to drinking.  Needing more and more alcohol to get the same effect you want from the alcohol (building up tolerance).  Having symptoms of withdrawal when you stop drinking. Symptoms of withdrawal include: ? Fatigue. ? Nightmares. ? Trouble sleeping. ? Depression. ? Anxiety. ? Fever. ? Seizures. ? Severe confusion. ? Feeling or seeing things that are not there (hallucinations). ? Tremors. ? Rapid heart rate. ? Rapid breathing. ? High blood pressure.  Drinking to avoid symptoms of withdrawal. How is this diagnosed? This condition is diagnosed with an assessment. Your health care provider may start the assessment by asking three or four questions about your drinking. Your health care provider may perform a physical exam or do lab tests to see if you have physical problems resulting from alcohol use. She or he may refer you to a mental health professional for evaluation. How is this treated? Some people with alcohol use disorder are able  to reduce their alcohol use to low-risk levels. Others need to completely quit drinking alcohol. When necessary, mental health professionals with specialized training in substance use treatment can help. Your health care provider can help you decide how severe your alcohol use disorder is and what type of treatment you need. The following  forms of treatment are available:  Detoxification. Detoxification involves quitting drinking and using prescription medicines within the first week to help lessen withdrawal symptoms. This treatment is important for people who have had withdrawal symptoms before and for heavy drinkers who are likely to have withdrawal symptoms. Alcohol withdrawal can be dangerous, and in severe cases, it can cause death. Detoxification may be provided in a home, community, or primary care setting, or in a hospital or substance use treatment facility.  Counseling. This treatment is also called talk therapy. It is provided by substance use treatment counselors. A counselor can address the reasons you use alcohol and suggest ways to keep you from drinking again or to prevent problem drinking. The goals of talk therapy are to: ? Find healthy activities and ways for you to cope with stress. ? Identify and avoid the things that trigger your alcohol use. ? Help you learn how to handle cravings.  Medicines.Medicines can help treat alcohol use disorder by: ? Decreasing alcohol cravings. ? Decreasing the positive feeling you have when you drink alcohol. ? Causing an uncomfortable physical reaction when you drink alcohol (aversion therapy).  Support groups. Support groups are led by people who have quit drinking. They provide emotional support, advice, and guidance. These forms of treatment are often combined. Some people with this condition benefit from a combination of treatments provided by specialized substance use treatment centers. Follow these instructions at home:  Take over-the-counter and prescription medicines only as told by your health care provider.  Check with your health care provider before starting any new medicines.  Ask friends and family members not to offer you alcohol.  Avoid situations where alcohol is served, including gatherings where others are drinking alcohol.  Create a plan for what to  do when you are tempted to use alcohol.  Find hobbies or activities that you enjoy that do not include alcohol.  Keep all follow-up visits as told by your health care provider. This is important. How is this prevented?  If you drink, limit alcohol intake to no more than 1 drink a day for nonpregnant women and 2 drinks a day for men. One drink equals 12 oz of beer, 5 oz of wine, or 1 oz of hard liquor.  If you have a mental health condition, get treatment and support.  Do not give alcohol to adolescents.  If you are an adolescent: ? Do not drink alcohol. ? Do not be afraid to say no if someone offers you alcohol. Speak up about why you do not want to drink. You can be a positive role model for your friends and set a good example for those around you by not drinking alcohol. ? If your friends drink, spend time with others who do not drink alcohol. Make new friends who do not use alcohol. ? Find healthy ways to manage stress and emotions, such as meditation or deep breathing, exercise, spending time in nature, listening to music, or talking with a trusted friend or family member. Contact a health care provider if:  You are not able to take your medicines as told.  Your symptoms get worse.  You return to drinking alcohol (relapse) and  your symptoms get worse. Get help right away if:  You have thoughts about hurting yourself or others. If you ever feel like you may hurt yourself or others, or have thoughts about taking your own life, get help right away. You can go to your nearest emergency department or call:  Your local emergency services (911 in the [place]  A suicide crisis helpline, such as the [facility] at (506)563-7158. This is open 24 hours a day. Summary  Alcohol use disorder is when your drinking disrupts your daily life. When you have this condition, you drink too much alcohol and you cannot control your drinking.  Treatment may include  detoxification, counseling, medicine, and support groups.  Ask friends and family members not to offer you alcohol. Avoid situations where alcohol is served.  Get help right away if you have thoughts about hurting yourself or others. This information is not intended to replace advice given to you by your health care provider. Make sure you discuss any questions you have with your health care provider. Document Released: 10/27/2004 Document Revised: 06/16/2016 Document Reviewed: 06/16/2016 Elsevier Interactive Patient Education  [employer].

## 2018-11-10 NOTE — Progress Notes (Addendum)
Progress Note  Patient Name: Richard [name] Date of Encounter: 11/10/2018  Primary Cardiologist: Jenkins Rouge, MD    Subjective   Has no complaints.  Denies any CP or SOB.  Tele with NSR  Inpatient Medications    Scheduled Meds: . carvedilol  12.5 mg Oral BID WC  . chlordiazePOXIDE  25 mg Oral BH-qamhs   Followed by  . [START ON 11/11/2018] chlordiazePOXIDE  25 mg Oral Daily  . diclofenac sodium  2 g Topical QID  . enoxaparin (LOVENOX) injection  40 mg Subcutaneous Q24H  . feeding supplement (ENSURE ENLIVE)  237 mL Oral BID BM  . folic acid  1 mg Oral Daily  . losartan  50 mg Oral Daily  . multivitamin with minerals  1 tablet Oral Daily  . nicotine  21 mg Transdermal Daily  . pantoprazole  40 mg Oral Daily  . thiamine  100 mg Oral Daily   Continuous Infusions: . dexmedetomidine (PRECEDEX) IV infusion Stopped (11/09/18 0958)  . lactated ringers 75 mL/hr at 11/09/18 1800  . magnesium sulfate 1 - 4 g bolus IVPB     PRN Meds: acetaminophen **OR** acetaminophen, chlordiazePOXIDE, dextrose, hydrOXYzine, loperamide, LORazepam, metoprolol tartrate, ondansetron   Vital Signs    Vitals:   11/09/18 2123 11/10/18 0538 11/10/18 0700 11/10/18 1059  BP: (!) 142/95 (!) 148/98  (!) 157/109  Pulse: 77 72  76  Resp: 18 18    Temp: 98.2 F (36.8 C) 98.5 F (36.9 C)    TempSrc: Oral Oral    SpO2: 100% 100%    Weight:   52.8 kg   Height:        Intake/Output Summary (Last 24 hours) at 11/10/2018 1154 Last data filed at 11/10/2018 0900 Gross per 24 hour  Intake 864.27 ml  Output 1200 ml  Net -335.73 ml   Last 3 Weights 11/10/2018 11/06/2018 09/28/2018  Weight (lbs) 116 lb 6.5 oz 125 lb 123 lb  Weight (kg) 52.8 kg 56.7 kg 55.792 kg      Telemetry    NSR - Personally Reviewed  ECG    No new EKG to review- Personally Reviewed  Physical Exam  GEN: Well nourished, well developed in no acute distress HEENT: Normal NECK: No JVD; No carotid bruits LYMPHATICS: No  lymphadenopathy CARDIAC:RRR, no murmurs, rubs, gallops RESPIRATORY:  Clear to auscultation without rales, wheezing or rhonchi  ABDOMEN: Soft, non-tender, non-distended MUSCULOSKELETAL:  No edema; No deformity  SKIN: Warm and dry NEUROLOGIC:  Alert and oriented x 3 PSYCHIATRIC:  Normal affect    Labs    Chemistry Recent Labs  Lab 11/06/18 0342 11/07/18 0418  11/08/18 0614 11/09/18 0708 11/10/18 0525  NA 134* 135   < > 140 137 134*  K 3.0* 3.2*   < > 4.1 3.8 3.7  CL 95* 97*   < > 108 101 103  CO2 24 23   < > 22 23 24   GLUCOSE 130* 99   < > 102* 101* 92  BUN <5* <5*   < > <5* <5* 5*  CREATININE 0.89 0.73   < > 0.53* 0.59* 0.58*  CALCIUM 8.8* 9.3   < > 8.1* 9.4 8.7*  PROT 7.4 7.4  --  6.0*  --   --   ALBUMIN 3.3* 3.3*  --  2.6*  --   --   AST 97* 84*  --  110*  --   --   ALT 22 23  --  29  --   --  ALKPHOS 108 91  --  68  --   --   BILITOT 1.3* 2.1*  --  1.7*  --   --   GFRNONAA >60 >60   < > >60 >60 >60  GFRAA >60 >60   < > >60 >60 >60  ANIONGAP 15 15   < > 10 13 7    < > = values in this interval not displayed.     Hematology Recent Labs  Lab 11/06/18 0342 11/08/18 0614 11/10/18 0525  WBC 7.6 5.0 6.4  RBC 4.22 3.60* 3.49*  HGB 12.7* 11.2* 10.6*  HCT 37.8* 33.5* 31.8*  MCV 89.6 93.1 91.1  MCH 30.1 31.1 30.4  MCHC 33.6 33.4 33.3  RDW 17.5* 17.0* 16.8*  PLT 174 142* 194    Cardiac Enzymes Recent Labs  Lab 11/05/18 0529  TROPONINI <0.03   No results for input(s): TROPIPOC in the last 168 hours.   BNPNo results for input(s): BNP, PROBNP in the last 168 hours.   DDimer No results for input(s): DDIMER in the last 168 hours.   Radiology    No results found.  Cardiac Studies   TTE  EF 30-35% diffuse hypokinesis   Patient Profile     53 y.o. male with DT;s ETOH abuse with tachycardia TTE with likley alcoholic DCM EF 29-47%   Assessment & Plan    1.  Paroxysmal Atrial tachycardia -likely related to DTs   -resolved with BB and treatment of  DTs  2.  ETOH abuse  - treatment of DTs per primary service   3.  DCM -Likely non ischemic from ETOH/tachycardia and poorly controlled HTN -troponin neg x 4 -continue carvedilol 12.5mg  BID and increase Losartan to 100mg  daily -since trop negative and no h/o CP can get outpt ischemic workup  3.  HTN -BP remains poorly controlled despite Carvedilol 12.5mg  BID and Losartan 50mg  daily.   -increase Losartan to 100mg  daily.  -amlodipine stopped on admision -if BP remains elevated on Losartan 100mg  daily then would increase carvedilol further  [facility] will sign off.   Medication Recommendations:  Carvedilol 12.5mg  BID and Losartan 100mg  daily Other recommendations (labs, testing, etc):  Lexiscan myoview as outpt in [employer] - we will set up Follow up as an outpatient:  2-3 weeks with Dr. Johnsie Cancel     For questions or updates, please contact [facility] Please consult [email or web address] for contact info under      Signed, Fransico Him, MD  11/10/2018, 11:54 AM

## 2018-11-10 NOTE — Progress Notes (Signed)
Transfer Summary: Richard [name] was initially admitted on 11/05/2018 with abdominal pain and vomiting for alcoholic ketoacidosis. He was given fluid resuscitation with improvement in his acidosis and was beginning to tolerate oral intake. During admission, he developed supraventricular tachycardia with heart rate up to 180s. Cardiology was consulted and his heart rate convered to sinus rhythm after multiple doses of IV metoprolol. ~48 hours into the admission, he began to have significant agitation, delirium, and combative behavior against nursing staff with increasing CIWA score. He was transferred to ICU for sedation with precedex drip. He was transitioned to Librium after being on precedex. He was transferred to general medical floor.  Summary of Significant Hospital Events:  11/05/18 admisson 11/07/18 Agitation, Combativeness 11/08/18 SVTs, Violence against staff, transferred to ICU w/ precedex 11/10/18 Off precedex transfer to general floor  Current Medications:  Coreg 12.5mg  BID qc Librium 25mg  bid & PRN Hydroxyzine 25mg  PRN for anxiety Losartan 50mg  daily  Panroprazole 40mg  daily Thiamine 100mg  daily Folate 1mg  daily  Subjective:  Richard [name] is a 53 y.o. with PMH of Alcohol Use Disorder admit for alcoholic ketoacidosis on hospital day 5  Richard [name] was examined and evaluated at bedside this AM. He was observed resting comfortably in bed choosing breakfast. He states he feels bad about his behavior and wanted to apologize to the nursing staff. He states he does not feel anxious or agitated. Denies any tremors or hallucinations. He mentions that he is planning to go to his mother's place to be away from familiar surrounding so that he will not relapse. Denies any F/N/V/D/C. States his abdominal pain is resolved.  Objective:  Vital signs in last 24 hours: Vitals:   11/09/18 1900 11/09/18 2032 11/09/18 2123 11/10/18 0538  BP: 139/90  (!) 142/95 (!) 148/98  Pulse: 83  77 72  Resp: (!) 23  18 18     Temp:  98.9 F (37.2 C) 98.2 F (36.8 C) 98.5 F (36.9 C)  TempSrc:  Oral Oral Oral  SpO2: 99%  100% 100%  Weight:      Height:        Gen: Cachetic-appearing, NAD HEENT: NCAT head, hearing intact, EOMI,  MMM Neck: supple, ROM intact, no JVD, no cervical adenopathy CV: RRR, S1, S2 normal, No rubs, no murmurs, no gallops Pulm: CTAB, Bilateral rales   Abd: Soft, BS+, NTND, No rebound, no guarding Extm: ROM intact, Peripheral pulses intact, No peripheral edema Skin: Dry, Warm, normal turgor, no rashes, lesions, wounds.  Neuro: AAOx3 Psych: Normal mood and affect  Assessment/Plan:  Principal Problem:   High anion gap metabolic acidosis Active Problems:   Hepatic steatosis   Alcohol abuse   Alcoholic ketoacidosis   Abdominal pain  Richard [name] is a 53 yo M w/ PMH of AUD admit for alcoholic ketoacidosis. Much more calm after precedex drip. Currently stable. His presenting symptoms have all resolved. Not experiencing any more nausea or vomiting. His heart rate is in 70s. He would benefit from alcohol abstinence and should be getting naltrexone but has social barriers (financial mostly). He is stable for discharge today.   Delirium tremens Off precedex and received dose of Librium. Current CIWA score 0.  - Resolved  Atrial Tachycardia Stable on beta blocker. 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 - Appreciate cardiology recs: F/u ischemic eval as outpatient  DVT prophx: Lovenox Diet: Regular Code: Full  Dispo: Anticipated discharge in approximately 0  day(s).   Mosetta Anis, MD 11/10/2018, 6:30 AM Pager: [phone]

## 2018-11-10 NOTE — Progress Notes (Signed)
Nsg Discharge Note  Admit Date:  11/05/2018 Discharge date: 11/10/2018   Richard [name] to be D/C'd Home per MD order.  AVS completed.  Copy for chart, and copy for patient signed, and dated. Patient/caregiver able to verbalize understanding.  Discharge Medication: Allergies as of 11/10/2018      Reactions   Aspirin Other (See Comments)   Acid reflux    Penicillins Hives   Has patient had a PCN reaction causing immediate rash, facial/tongue/throat swelling, SOB or lightheadedness with hypotension: yes Has patient had a PCN reaction causing severe rash involving mucus membranes or skin necrosis: no Has patient had a PCN reaction that required hospitalization: no Has patient had a PCN reaction occurring within the last 10 years: no If all of the above answers are "NO", then may proceed with Cephalosporin use.      Medication List    STOP taking these medications   amLODipine 10 MG tablet Commonly known as:  NORVASC   doxycycline 100 MG tablet Commonly known as:  VIBRA-TABS   ondansetron 4 MG tablet Commonly known as:  ZOFRAN   traMADol 50 MG tablet Commonly known as:  ULTRAM     TAKE these medications   carvedilol 12.5 MG tablet Commonly known as:  COREG Take 1 tablet (12.5 mg total) by mouth 2 (two) times daily with a meal.   folic acid 1 MG tablet Commonly known as:  FOLVITE Take 1 tablet (1 mg total) by mouth daily.   losartan 100 MG tablet Commonly known as:  COZAAR Take 1 tablet (100 mg total) by mouth daily. Start taking on:  November 11, 2018   naltrexone 50 MG tablet Commonly known as:  DEPADE Take 1 tablet (50 mg total) by mouth daily.   pantoprazole 40 MG tablet Commonly known as:  PROTONIX Take 1 tablet (40 mg total) by mouth daily. Start taking on:  November 11, 2018 What changed:    when to take this  additional instructions   thiamine 100 MG tablet Take 1 tablet (100 mg total) by mouth daily.       Discharge Assessment: Vitals:   11/10/18  1059 11/10/18 1330  BP: (!) 157/109 (!) 147/97  Pulse: 76 79  Resp:  17  Temp:  98.8 F (37.1 C)  SpO2:  91%   Skin clean, dry and intact without evidence of skin break down, no evidence of skin tears noted. IV catheter discontinued intact. Site without signs and symptoms of complications - no redness or edema noted at insertion site, patient denies c/o pain - only slight tenderness at site.  Dressing with slight pressure applied.  D/c Instructions-Education: Discharge instructions given to patient/family with verbalized understanding. D/c education completed with patient/family including follow up instructions, medication list, d/c activities limitations if indicated, with other d/c instructions as indicated by MD - patient able to verbalize understanding, all questions fully answered. Patient instructed to return to ED, call 911, or call MD for any changes in condition.  Patient escorted via [name], and D/C home via private auto.  [name], [name], RN 11/10/2018 2:47 PM

## 2018-11-10 NOTE — Discharge Summary (Signed)
Name: Richard [name] MRN: [id number] DOB: [birth year] 53 y.o. PCP: Antony Blackbird, MD  Date of Admission: 11/05/2018  4:44 AM Date of Discharge: 11/10/2018 Attending Physician: Murriel Hopper, MD  Discharge Diagnosis: 1. Alcoholic ketoacidosis 2. Delirium Tremens 3. Paroxysmal Atrial Tachycardia 4. Dilated cardiomyopathy  Discharge Medications: Allergies as of 11/10/2018      Reactions   Aspirin Other (See Comments)   Acid reflux    Penicillins Hives   Has patient had a PCN reaction causing immediate rash, facial/tongue/throat swelling, SOB or lightheadedness with hypotension: yes Has patient had a PCN reaction causing severe rash involving mucus membranes or skin necrosis: no Has patient had a PCN reaction that required hospitalization: no Has patient had a PCN reaction occurring within the last 10 years: no If all of the above answers are "NO", then may proceed with Cephalosporin use.      Medication List    STOP taking these medications   amLODipine 10 MG tablet Commonly known as:  NORVASC   doxycycline 100 MG tablet Commonly known as:  VIBRA-TABS   ondansetron 4 MG tablet Commonly known as:  ZOFRAN   traMADol 50 MG tablet Commonly known as:  ULTRAM     TAKE these medications   carvedilol 12.5 MG tablet Commonly known as:  COREG Take 1 tablet (12.5 mg total) by mouth 2 (two) times daily with a meal.   folic acid 1 MG tablet Commonly known as:  FOLVITE Take 1 tablet (1 mg total) by mouth daily.   losartan 100 MG tablet Commonly known as:  COZAAR Take 1 tablet (100 mg total) by mouth daily.   naltrexone 50 MG tablet Commonly known as:  DEPADE Take 1 tablet (50 mg total) by mouth daily.   pantoprazole 40 MG tablet Commonly known as:  PROTONIX Take 1 tablet (40 mg total) by mouth daily. What changed:    when to take this  additional instructions   thiamine 100 MG tablet Take 1 tablet (100 mg total) by mouth daily.      Disposition and follow-up:    Richard [name] was discharged from [facility] in Stable condition.  At the hospital follow up visit please address:  1. Alcoholic ketoacidosis - Discharged after ketoacidosis resolved with IV fluid resuscitation - Please checking liver function for resolution of elevated transaminitis - Evaluate nutritional status and alcohol abstinence  2. Delirium Tremens - Discharged after withdrawal symptoms resolved after precedex drip - Please re-assess his willingness for alcohol abstinence - Please see if he was able to pick up his naltrexone  3. Paroxysmal Atrial Tachycardia - Please check his heart rate - Please ensure he followed up with cardiology  4. Dilated cardiomyopathy - Please check his blood pressure and volume status - Please ensure he followed up with cardiology for ischemic evaluation  2.  Labs / imaging needed at time of follow-up: CMP, Phosphorus, Magnesium, EKG  3.  Pending labs/ test needing follow-up:None  Follow-up Appointments: Follow-up Information    Antony Blackbird, MD. Call.   Specialty:  Family Medicine Contact information: [place] [address] [phone]        Josue Hector, MD .   Specialty:  Cardiology Contact information: [phone] [address] [place] by problem list: 1. Alcoholic ketoacidosis 2/2 alcohol use disorder: Richard [name] is a 53 yo M w/ PMH of alcohol use disorder  and stage 2 colon ca s/p colectomy in remission presenting with abdominal pain and nausea and vomiting. CT findings were negative for acute pathology. He had hypoglycemia (59), elevated lactate and elevated alcohol level with gap of 26. He was treated with aggressive fluid hydration.  He was also found to have significant hypophosphatemia and hypomagnesia which were treated with IV and oral supplementation. His abdominal pain, nausea and vomiting resolved. His gap closed  after 2 days. He was discharged with naltrexone prescription and recommendation for alcohol abstinence.  2. Delirium Tremens: Presented w/ blood alcohol level of 226. ~48 hours after admission, started to have elevated CIWA scores. He was delirious with inability to answer orientation questions. Began to be aggressive and combative with violence against nursing staff despite heavy Ativan use. He was transferred to ICU for precedex drip. Eventually his agitation resolved after 3 days in the ICU and was transferred back to the general floor. He was discharged with naltrexone prescription with recommendation for alcohol abstinence.  3. Paroxysmal Atrial Tachycardia: On day 2 of hospitalization, he began to develop tachycardia with HR rising up to 170s. Multiple doses of IV lopressor given with some improvement. EKG showed SVT with diffuse ST depressions without significant chest pain or palpitations. Cardiology was consulted with recommendation for Lopressor 5mg  q61mins. Adenosine was considered but his blood pressure dropped after ativan doses and was never given. He was diagnosed with paroxysmal atrial tachycardia which resolved after treatment of his delirium tremens and was discharged on carvedilol 12.5mg  BID.  4. Dilated cardiomyopathy: Had episodes of hypotension (90/60) after receiving multiple doses of metoprolol for tachycardia and ativan for alcohol withdrawal. Blood pressure resolved with fluid resuscitation. Follow up echocardiogram showed dilated cardiomyopathy and heart failure with ejection fraction of 30-35%. He was discharged with recommendation on carvedilol and losartan with recommendation to follow up with cardiology for ischemic evaluation.  Discharge Vitals:   BP (!) 147/97 (BP Location: Right Arm)   Pulse 79   Temp 98.8 F (37.1 C) (Oral)   Resp 17   Ht 6' (1.829 m)   Wt 52.8 kg   SpO2 91%   BMI 15.79 kg/m   Pertinent Labs, Studies, and Procedures:  BMP Latest Ref Rng & Units  11/10/2018 11/09/2018 11/08/2018  Glucose 70 - 99 mg/dL 92 101(H) 102(H)  BUN 6 - 20 mg/dL 5(L) <5(L) <5(L)  Creatinine 0.61 - 1.24 mg/dL 0.58(L) 0.59(L) 0.53(L)  BUN/Creat Ratio 9 - 20 - - -  Sodium 135 - 145 mmol/L 134(L) 137 140  Potassium 3.5 - 5.1 mmol/L 3.7 3.8 4.1  Chloride 98 - 111 mmol/L 103 101 108  CO2 22 - 32 mmol/L 24 23 22   Calcium 8.9 - 10.3 mg/dL 8.7(L) 9.4 8.1(L)   Trans-thoracic Echocardiogram IMPRESSIONS  1. The left ventricle has moderate-severely reduced systolic function of 94-49%. The cavity size was normal. There is no increased left ventricular wall thickness. Echo evidence of impaired diastolic relaxation Left ventrical global hypokinesis without  regional wall motion abnormalities.  2. The right ventricle has mildly reduced systolic function. The cavity was mildly enlarged. There is no increase in right ventricular wall thickness.  3. The mitral valve is normal in structure. No evidence of mitral valve stenosis. No significant mitral regurgitation.  4. The tricuspid valve is normal in structure.  5. The aortic valve is tricuspid. No aortic stenosis.  6. The aortic root and ascending aorta are normal in size and structure.  7. The inferior vena cava is normal in size  with greater than 50% respiratory variability.  8. No complete TR doppler jet so unable to estimate PA systolic pressure.  CT abd/pelvis IMPRESSION:   1. Status post subtotal colectomy. Anastomosis patent in the left abdomen. No evident bowel obstruction currently. No abscess evident in the abdomen or pelvis.  2.  Hepatic steatosis.  3.  Aortoiliac atherosclerosis.  4.  Mild lower lobe bronchiectatic change.   Discharge Instructions: Mr.Bugaj  You were admitted for nausea, vomiting and abdominal pain. You were found to be severely malnourished with too much acid in your blood from drinking too much alcohol. Here are our recommendations at discharge:  - Please follow up with your Primary  Care Provider - Please follow up with a Heart Doctor  - Please start carvedilol 12.5mg  BID with meals - Please start losartan 100mg  daily - Please start naltrexone 50mg  daily - Please try to avoid any more alcohol use  Thank you for choosing [facility]  Discharge Instructions    Call MD for:  difficulty breathing, headache or visual disturbances   Complete by:  As directed    Call MD for:  persistant dizziness or light-headedness   Complete by:  As directed    Call MD for:  persistant nausea and vomiting   Complete by:  As directed    Call MD for:  redness, tenderness, or signs of infection (pain, swelling, redness, odor or green/yellow discharge around incision site)   Complete by:  As directed    Call MD for:  severe uncontrolled pain   Complete by:  As directed    Diet - low sodium heart healthy   Complete by:  As directed    Increase activity slowly   Complete by:  As directed      Signed: Mosetta Anis, MD 11/12/2018, 12:08 AM   Pager: [phone]

## 2018-11-10 NOTE — Progress Notes (Signed)
Gave patient list of AA meetings in [place]

## 2018-11-10 NOTE — Evaluation (Signed)
Physical Therapy Evaluation & Discharge Patient Details Name: Richard [name] MRN: [id number] DOB: [birth year] Today's Date: 11/10/2018   History of Present Illness  Pt is a 53 y/o male admitted on 11/05/18 secondary to mild pancreatitis, abdominal pain, and alcoholic ketoacidosis. Admission complicated by supraventricular tachycardia, worsening delirium, and combativeness due to ETOH withdrawal. PMH including but not limited to HTN, colon cancer, tobacco use and ETOH abuse.    Clinical Impression  Pt presented sitting in room chair at sink washing up upon arrival. Prior to admission, pt reported that he was independent with ADLs and ambulated with use of a cane PRN. Pt currently able to perform transfers and ambulation with supervision for safety. No instability or LOB throughout. Pt reported feeling that he is back to his baseline in regards to mobility. No further acute or follow-up therapy services indicated at this time. PT signing off.      Follow Up Recommendations No PT follow up    Equipment Recommendations  None recommended by PT    Recommendations for Other Services       Precautions / Restrictions Precautions Precautions: Fall Restrictions Weight Bearing Restrictions: No      Mobility  Bed Mobility               General bed mobility comments: pt sitting EOB at end of session  Transfers Overall transfer level: Needs assistance Equipment used: None Transfers: Sit to/from Stand Sit to Stand: Supervision         General transfer comment: for safety  Ambulation/Gait Ambulation/Gait assistance: Supervision Gait Distance (Feet): 200 Feet Assistive device: IV Pole Gait Pattern/deviations: Step-through pattern Gait velocity: decreased   General Gait Details: pt with no instability or LOB, no need for physical assistance; pt able to navigate around obstacles in hallway without difficulty  Stairs            Wheelchair Mobility    Modified Rankin (Stroke  Patients Only)       Balance Overall balance assessment: Needs assistance Sitting-balance support: Feet supported Sitting balance-Leahy Scale: Normal     Standing balance support: During functional activity;No upper extremity supported Standing balance-Leahy Scale: Good                               Pertinent Vitals/Pain Pain Assessment: No/denies pain    Home Living Family/patient expects to be discharged to:: Private residence Living Arrangements: Non-relatives/Friends Available Help at Discharge: Friend(s);Available 24 hours/day   Home Access: Level entry     Home Layout: One level Home Equipment: Cane - single point      Prior Function Level of Independence: Independent with assistive device(s)         Comments: pt ambulates with a cane PRN     Hand Dominance   Dominant Hand: Right    Extremity/Trunk Assessment   Upper Extremity Assessment Upper Extremity Assessment: Overall WFL for tasks assessed    Lower Extremity Assessment Lower Extremity Assessment: Overall WFL for tasks assessed    Cervical / Trunk Assessment Cervical / Trunk Assessment: Normal  Communication   Communication: No difficulties  Cognition Arousal/Alertness: Awake/alert Behavior During Therapy: WFL for tasks assessed/performed Overall Cognitive Status: Within Functional Limits for tasks assessed  General Comments      Exercises     Assessment/Plan    PT Assessment Patent does not need any further PT services  PT Problem List         PT Treatment Interventions      PT Goals (Current goals can be found in the Care Plan section)  Acute Rehab PT Goals Patient Stated Goal: "to go home today" PT Goal Formulation: All assessment and education complete, DC therapy    Frequency     Barriers to discharge        Co-evaluation               AM-PAC PT "6 Clicks" Mobility  Outcome Measure Help  needed turning from your back to your side while in a flat bed without using bedrails?: None Help needed moving from lying on your back to sitting on the side of a flat bed without using bedrails?: None Help needed moving to and from a bed to a chair (including a wheelchair)?: None Help needed standing up from a chair using your arms (e.g., wheelchair or bedside chair)?: None Help needed to walk in hospital room?: None Help needed climbing 3-5 steps with a railing? : A Little 6 Click Score: 23    End of Session   Activity Tolerance: Patient tolerated treatment well Patient left: in bed;with call bell/phone within reach;with bed alarm set;Other (comment)(sitting EOB) Nurse Communication: Mobility status PT Visit Diagnosis: Other abnormalities of gait and mobility (R26.89)    Time: 3335-4562 PT Time Calculation (min) (ACUTE ONLY): 18 min   Charges:   PT Evaluation $PT Eval Moderate Complexity: 1 Mod          Sherie Don, PT, DPT  Acute Rehabilitation Services Pager [phone] Office [place] 11/10/2018, 11:46 AM

## 2018-12-21 ENCOUNTER — Telehealth: Payer: Self-pay | Admitting: *Deleted

## 2018-12-21 NOTE — Telephone Encounter (Signed)
Patient did not completed labs during office visit. Patient was admitted for alcohol concern and repeated BMP was completed at that time.

## 2018-12-21 NOTE — Progress Notes (Signed)
Patient was seen and BMP was completed in the hospital in february

## 2019-04-27 ENCOUNTER — Emergency Department (HOSPITAL_COMMUNITY): Payer: Self-pay

## 2019-04-27 ENCOUNTER — Emergency Department (HOSPITAL_COMMUNITY)
Admission: EM | Admit: 2019-04-27 | Discharge: 2019-04-27 | Disposition: A | Discharge status: Home / Self Care | Payer: Self-pay | Attending: Emergency Medicine | Admitting: Emergency Medicine

## 2019-04-27 ENCOUNTER — Other Ambulatory Visit: Payer: Self-pay

## 2019-04-27 DIAGNOSIS — Z85038 Personal history of other malignant neoplasm of large intestine: Secondary | ICD-10-CM | POA: Insufficient documentation

## 2019-04-27 DIAGNOSIS — F17210 Nicotine dependence, cigarettes, uncomplicated: Secondary | ICD-10-CM | POA: Insufficient documentation

## 2019-04-27 DIAGNOSIS — R0789 Other chest pain: Secondary | ICD-10-CM | POA: Insufficient documentation

## 2019-04-27 DIAGNOSIS — Z880 Allergy status to penicillin: Secondary | ICD-10-CM | POA: Insufficient documentation

## 2019-04-27 DIAGNOSIS — Z79899 Other long term (current) drug therapy: Secondary | ICD-10-CM | POA: Insufficient documentation

## 2019-04-27 DIAGNOSIS — I10 Essential (primary) hypertension: Secondary | ICD-10-CM | POA: Insufficient documentation

## 2019-04-27 DIAGNOSIS — Z886 Allergy status to analgesic agent status: Secondary | ICD-10-CM | POA: Insufficient documentation

## 2019-04-27 DIAGNOSIS — R0602 Shortness of breath: Secondary | ICD-10-CM | POA: Insufficient documentation

## 2019-04-27 LAB — BASIC METABOLIC PANEL
Anion gap: 16 — ABNORMAL HIGH (ref 5–15)
BUN: <5 mg/dL — ABNORMAL LOW (ref 6–20)
CO2: 22 mmol/L (ref 22–32)
Calcium: 9.7 mg/dL (ref 8.9–10.3)
Chloride: 101 mmol/L (ref 98–111)
Creatinine, Ser: 0.68 mg/dL (ref 0.61–1.24)
GFR calc Af Amer: >60 mL/min (ref >60)
GFR calc non Af Amer: >60 mL/min (ref >60)
Glucose, Bld: 84 mg/dL (ref 70–99)
Potassium: 3.5 mmol/L (ref 3.5–5.1)
Sodium: 139 mmol/L (ref 135–145)

## 2019-04-27 LAB — CBC
HCT: 33.7 % — ABNORMAL LOW (ref 39.0–52.0)
Hemoglobin: 11.4 g/dL — ABNORMAL LOW (ref 13.0–17.0)
MCH: 31.9 pg (ref 26.0–34.0)
MCHC: 33.8 g/dL (ref 30.0–36.0)
MCV: 94.4 fL (ref 80.0–100.0)
Platelets: 282 10*3/uL (ref 150–400)
RBC: 3.57 MIL/uL — ABNORMAL LOW (ref 4.22–5.81)
RDW: 16.3 % — ABNORMAL HIGH (ref 11.5–15.5)
WBC: 12 10*3/uL — ABNORMAL HIGH (ref 4.0–10.5)
nRBC: 0 % (ref 0.0–0.2)

## 2019-04-27 LAB — TROPONIN I (HIGH SENSITIVITY)
Troponin I (High Sensitivity): 11 ng/L (ref <18)
Troponin I (High Sensitivity): 8 ng/L (ref <18)

## 2019-04-27 MED ORDER — SODIUM CHLORIDE 0.9% FLUSH: 3.0000 mL | Freq: Once | INTRAVENOUS | Status: DC

## 2019-04-27 NOTE — Discharge Instructions (Signed)
Follow up with your family doc. Return for worsening symptoms.  °

## 2019-04-27 NOTE — ED Provider Notes (Signed)
[facility] EMERGENCY DEPARTMENT Provider Note   CSN: [id number] Arrival date & time: 04/27/19  0025    History   Chief Complaint Chief Complaint  Patient presents with  . Chest Pain  . Shortness of Breath    HPI Richard [name] is a 53 y.o. male.     53 yo M with a chief complaint of chest pain.  Going on for the past couple hours.  States he woke up from sleep and it was hurting.  Describes it as a sharp pain worse with movement palpation breathing.  Has been having significant cough.  Denies congestion or fever.  Denies vomiting or diarrhea.  The history is provided by the patient.  Chest Pain Pain location:  L chest, L lateral chest, R lateral chest and R chest Pain quality: aching, sharp and shooting   Pain radiates to:  Does not radiate Pain severity:  Moderate Onset quality:  Gradual Duration:  2 hours Timing:  Constant Progression:  Worsening Chronicity:  New Context: breathing   Relieved by:  Nothing Worsened by:  Nothing Ineffective treatments:  None tried Associated symptoms: cough and shortness of breath   Associated symptoms: no abdominal pain, no fever, no headache, no palpitations and no vomiting   Shortness of Breath Associated symptoms: chest pain and cough   Associated symptoms: no abdominal pain, no fever, no headaches, no rash and no vomiting     Past Medical History:  Diagnosis Date  . Acid reflux   . Colon cancer ([place])   . ETOH abuse   . HTN (hypertension)   . Pancreatitis 08/2018  . PUD (peptic ulcer disease)     Patient Active Problem List   Diagnosis Date Noted  . SVT (supraventricular tachycardia) ([place])   . Hypomagnesemia   . Hypophosphatemia   . High anion gap metabolic acidosis 18/84/1660  . Acute pancreatitis 08/14/2018  . Smoker 08/14/2018  . GERD (gastroesophageal reflux disease) 08/14/2018  . HTN (hypertension) 08/14/2018  . Abdominal pain 06/27/2018  . Hypertensive urgency 06/27/2018  . Elevated  troponin 06/27/2018  . Hypoglycemia 06/27/2018  . Protein-calorie malnutrition, severe 05/02/2018  . Pancreatitis 05/01/2018  . Occult blood positive stool 11/13/2017  . Alcoholic ketoacidosis 63/10/6008  . Hepatic steatosis 08/21/2016  . Thrombocytopenia ([place]) 08/21/2016  . Alcohol abuse 08/21/2016  . Alcohol withdrawal delirium ([place]) 08/20/2016  . Dehydration 08/20/2016  . Intractable nausea and vomiting 08/20/2016  . Lactic acidosis 08/20/2016  . Aspiration pneumonia ([place]) 08/20/2016  . Sepsis ([place]) 08/20/2016    Past Surgical History:  Procedure Laterality Date  . LAPAROSCOPIC SIGMOID COLECTOMY  2007        Home Medications    Prior to Admission medications   Medication Sig Start Date End Date Taking? Authorizing Provider  carvedilol (COREG) 12.5 MG tablet Take 1 tablet (12.5 mg total) by mouth 2 (two) times daily with a meal. 11/10/18 02/08/19  Mosetta Anis, MD  folic acid (FOLVITE) 1 MG tablet Take 1 tablet (1 mg total) by mouth daily. Patient not taking: Reported on 11/05/2018 07/20/18   Fulp, Ander Gaster, MD  losartan (COZAAR) 100 MG tablet Take 1 tablet (100 mg total) by mouth daily. 11/11/18   Mosetta Anis, MD  naltrexone (DEPADE) 50 MG tablet Take 1 tablet (50 mg total) by mouth daily. 11/10/18   Mosetta Anis, MD  pantoprazole (PROTONIX) 40 MG tablet Take 1 tablet (40 mg total) by mouth daily. 11/11/18   Mosetta Anis, MD  thiamine  100 MG tablet Take 1 tablet (100 mg total) by mouth daily. Patient not taking: Reported on 11/05/2018 07/20/18   Antony Blackbird, MD    Family History Family History  Problem Relation Age of Onset  . Colon cancer Father   . Cancer Sister   . CAD Neg Hx   . Stroke Neg Hx   . Diabetes Neg Hx     Social History Social History   Tobacco Use  . Smoking status: Current Every Day Smoker    Packs/day: 1.00  . Smokeless tobacco: Never Used  Substance Use Topics  . Alcohol use: Yes    Alcohol/week: 2.0 standard drinks    Types: 1 Cans of beer, 1  Shots of liquor per week    Comment: heavy alcohol abuse a beer and couple of shots a day for past  14 years  . Drug use: No     Allergies   Aspirin and Penicillins   Review of Systems Review of Systems  Constitutional: Negative for chills and fever.  HENT: Negative for congestion and facial swelling.   Eyes: Negative for discharge and visual disturbance.  Respiratory: Positive for cough and shortness of breath.   Cardiovascular: Positive for chest pain. Negative for palpitations.  Gastrointestinal: Negative for abdominal pain, diarrhea and vomiting.  Musculoskeletal: Negative for arthralgias and myalgias.  Skin: Negative for color change and rash.  Neurological: Negative for tremors, syncope and headaches.  Psychiatric/Behavioral: Negative for confusion and dysphoric mood.     Physical Exam Updated Vital Signs BP (!) 123/93   Pulse 80   Resp (!) 22   Ht 6' (1.829 m)   Wt 59 kg   SpO2 94%   BMI 17.63 kg/m   Physical Exam Vitals signs and nursing note reviewed.  Constitutional:      Appearance: He is well-developed.     Comments: Smells of marijuana  HENT:     Head: Normocephalic and atraumatic.  Eyes:     Pupils: Pupils are equal, round, and reactive to light.  Neck:     Musculoskeletal: Normal range of motion and neck supple.     Vascular: No JVD.  Cardiovascular:     Rate and Rhythm: Normal rate and regular rhythm.     Heart sounds: No murmur. No friction rub. No gallop.   Pulmonary:     Effort: No respiratory distress.     Breath sounds: No wheezing.  Chest:     Chest wall: Tenderness present.     Comments: Mild diffuse tenderness about the chest Abdominal:     General: There is no distension.     Tenderness: There is no guarding or rebound.  Musculoskeletal: Normal range of motion.  Skin:    Coloration: Skin is not pale.     Findings: No rash.  Neurological:     Mental Status: He is alert and oriented to person, place, and time.  Psychiatric:         Behavior: Behavior normal.      ED Treatments / Results  Labs (all labs ordered are listed, but only abnormal results are displayed) Labs Reviewed  BASIC METABOLIC PANEL - Abnormal; Notable for the following components:      Result Value   BUN <5 (*)    Anion gap 16 (*)    All other components within normal limits  CBC - Abnormal; Notable for the following components:   WBC 12.0 (*)    RBC 3.57 (*)    Hemoglobin 11.4 (*)  HCT 33.7 (*)    RDW 16.3 (*)    All other components within normal limits  TROPONIN I (HIGH SENSITIVITY)  TROPONIN I (HIGH SENSITIVITY)    EKG EKG Interpretation  Date/Time:  Saturday April 27 2019 00:33:54 EDT Ventricular Rate:  90 PR Interval:  164 QRS Duration: 86 QT Interval:  392 QTC Calculation: 479 R Axis:   62 Text Interpretation:  Normal sinus rhythm Septal infarct , age undetermined Abnormal ECG Since last tracing rate slower Otherwise no significant change Confirmed by Deno Etienne [phone]) on 04/27/2019 3:01:26 AM   Radiology Dg Chest 2 View  Result Date: 04/27/2019 CLINICAL DATA:  Chest pain EXAM: CHEST - 2 VIEW COMPARISON:  November 05, 2018 FINDINGS: Heart size is normal. There are chronic lung changes bilaterally without evidence of a focal infiltrate or large pleural effusion or pneumothorax. There is no acute osseous abnormality. Aortic calcifications are noted. The lungs appear somewhat hyperexpanded. IMPRESSION: No active cardiopulmonary disease. Electronically Signed   By: Constance Holster M.D.   On: 04/27/2019 01:21    Procedures Procedures (including critical care time)  Medications Ordered in ED Medications  sodium chloride flush (NS) 0.9 % injection 3 mL (has no administration in time range)     Initial Impression / Assessment and Plan / ED Course  I have reviewed the triage vital signs and the nursing notes.  Pertinent labs & imaging results that were available during my care of the patient were reviewed by me and  considered in my medical decision making (see chart for details).        53 yo M with a chief complaint of chest pain.  Atypical in nature reproduced on exam.  Delta troponin is negative.  Mild leukocytosis.  Chest x-ray viewed by me without focal infiltrate or pneumothorax.  Discharge home.  4:35 AM:  I have discussed the diagnosis/risks/treatment options with the patient and believe the pt to be eligible for discharge home to follow-up with PCP. We also discussed returning to the ED immediately if new or worsening sx occur. We discussed the sx which are most concerning (e.g., sudden worsening pain, fever, inability to tolerate by mouth) that necessitate immediate return. Medications administered to the patient during their visit and any new prescriptions provided to the patient are listed below.  Medications given during this visit Medications  sodium chloride flush (NS) 0.9 % injection 3 mL (has no administration in time range)     The patient appears reasonably screen and/or stabilized for discharge and I doubt any other medical condition or other [facility] requiring further screening, evaluation, or treatment in the ED at this time prior to discharge.    Final Clinical Impressions(s) / ED Diagnoses   Final diagnoses:  Atypical chest pain    ED Discharge Orders    None       Deno Etienne, DO 04/27/19 [phone]

## 2019-04-27 NOTE — ED Notes (Signed)
Discharge instructions and follow up care discussed with pt. Pt verbalized understanding. No questions at this time

## 2019-04-27 NOTE — ED Triage Notes (Signed)
Came in POV; c/o waking up w/ chest pain and shortness of breathe.

## 2019-10-29 ENCOUNTER — Ambulatory Visit: Payer: Self-pay | Attending: Internal Medicine

## 2019-10-29 DIAGNOSIS — Z20822 Contact with and (suspected) exposure to covid-19: Secondary | ICD-10-CM | POA: Insufficient documentation

## 2019-10-30 LAB — NOVEL CORONAVIRUS, NAA: SARS-CoV-2, NAA: NOT DETECTED

## 2019-12-12 ENCOUNTER — Inpatient Hospital Stay (HOSPITAL_COMMUNITY)
Admission: EM | Admit: 2019-12-12 | Discharge: 2019-12-14 | DRG: 377 | Disposition: A | Discharge status: Home / Self Care | Payer: Self-pay | Attending: Internal Medicine | Admitting: Internal Medicine

## 2019-12-12 ENCOUNTER — Other Ambulatory Visit: Payer: Self-pay

## 2019-12-12 DIAGNOSIS — F1210 Cannabis abuse, uncomplicated: Secondary | ICD-10-CM | POA: Diagnosis present

## 2019-12-12 DIAGNOSIS — R188 Other ascites: Secondary | ICD-10-CM | POA: Diagnosis present

## 2019-12-12 DIAGNOSIS — Z85038 Personal history of other malignant neoplasm of large intestine: Secondary | ICD-10-CM

## 2019-12-12 DIAGNOSIS — K648 Other hemorrhoids: Secondary | ICD-10-CM | POA: Diagnosis present

## 2019-12-12 DIAGNOSIS — Z8249 Family history of ischemic heart disease and other diseases of the circulatory system: Secondary | ICD-10-CM

## 2019-12-12 DIAGNOSIS — K7011 Alcoholic hepatitis with ascites: Secondary | ICD-10-CM | POA: Diagnosis present

## 2019-12-12 DIAGNOSIS — F17210 Nicotine dependence, cigarettes, uncomplicated: Secondary | ICD-10-CM | POA: Diagnosis present

## 2019-12-12 DIAGNOSIS — K219 Gastro-esophageal reflux disease without esophagitis: Secondary | ICD-10-CM | POA: Diagnosis present

## 2019-12-12 DIAGNOSIS — E43 Unspecified severe protein-calorie malnutrition: Secondary | ICD-10-CM | POA: Diagnosis present

## 2019-12-12 DIAGNOSIS — R112 Nausea with vomiting, unspecified: Secondary | ICD-10-CM | POA: Diagnosis present

## 2019-12-12 DIAGNOSIS — K3189 Other diseases of stomach and duodenum: Secondary | ICD-10-CM | POA: Diagnosis present

## 2019-12-12 DIAGNOSIS — K644 Residual hemorrhoidal skin tags: Secondary | ICD-10-CM | POA: Diagnosis present

## 2019-12-12 DIAGNOSIS — K635 Polyp of colon: Secondary | ICD-10-CM | POA: Diagnosis present

## 2019-12-12 DIAGNOSIS — Z20822 Contact with and (suspected) exposure to covid-19: Secondary | ICD-10-CM | POA: Diagnosis present

## 2019-12-12 DIAGNOSIS — Z800 Family history of malignant neoplasm of digestive organs: Secondary | ICD-10-CM

## 2019-12-12 DIAGNOSIS — K920 Hematemesis: Principal | ICD-10-CM | POA: Diagnosis present

## 2019-12-12 DIAGNOSIS — I700 Atherosclerosis of aorta: Secondary | ICD-10-CM | POA: Diagnosis present

## 2019-12-12 DIAGNOSIS — K860 Alcohol-induced chronic pancreatitis: Secondary | ICD-10-CM

## 2019-12-12 DIAGNOSIS — F1010 Alcohol abuse, uncomplicated: Secondary | ICD-10-CM | POA: Diagnosis present

## 2019-12-12 DIAGNOSIS — F1020 Alcohol dependence, uncomplicated: Secondary | ICD-10-CM | POA: Diagnosis present

## 2019-12-12 DIAGNOSIS — Z681 Body mass index (BMI) 19 or less, adult: Secondary | ICD-10-CM

## 2019-12-12 DIAGNOSIS — Z880 Allergy status to penicillin: Secondary | ICD-10-CM

## 2019-12-12 DIAGNOSIS — K2100 Gastro-esophageal reflux disease with esophagitis, without bleeding: Secondary | ICD-10-CM | POA: Diagnosis present

## 2019-12-12 DIAGNOSIS — Z886 Allergy status to analgesic agent status: Secondary | ICD-10-CM

## 2019-12-12 DIAGNOSIS — E876 Hypokalemia: Secondary | ICD-10-CM | POA: Diagnosis present

## 2019-12-12 DIAGNOSIS — K760 Fatty (change of) liver, not elsewhere classified: Secondary | ICD-10-CM | POA: Diagnosis present

## 2019-12-12 DIAGNOSIS — Z9049 Acquired absence of other specified parts of digestive tract: Secondary | ICD-10-CM

## 2019-12-12 DIAGNOSIS — K449 Diaphragmatic hernia without obstruction or gangrene: Secondary | ICD-10-CM | POA: Diagnosis present

## 2019-12-12 DIAGNOSIS — K319 Disease of stomach and duodenum, unspecified: Secondary | ICD-10-CM

## 2019-12-12 DIAGNOSIS — E860 Dehydration: Secondary | ICD-10-CM | POA: Diagnosis present

## 2019-12-12 DIAGNOSIS — I10 Essential (primary) hypertension: Secondary | ICD-10-CM | POA: Diagnosis present

## 2019-12-12 DIAGNOSIS — R195 Other fecal abnormalities: Secondary | ICD-10-CM

## 2019-12-12 DIAGNOSIS — I420 Dilated cardiomyopathy: Secondary | ICD-10-CM | POA: Diagnosis present

## 2019-12-12 DIAGNOSIS — R109 Unspecified abdominal pain: Secondary | ICD-10-CM | POA: Diagnosis present

## 2019-12-12 DIAGNOSIS — K7031 Alcoholic cirrhosis of liver with ascites: Secondary | ICD-10-CM | POA: Diagnosis present

## 2019-12-12 DIAGNOSIS — E871 Hypo-osmolality and hyponatremia: Secondary | ICD-10-CM | POA: Diagnosis present

## 2019-12-12 DIAGNOSIS — R1084 Generalized abdominal pain: Secondary | ICD-10-CM

## 2019-12-12 MED ORDER — MORPHINE SULFATE (PF) 4 MG/ML IV SOLN
4.0000 mg | Freq: Once | INTRAVENOUS | Status: AC
  Administered 2019-12-13: 4 mg via INTRAVENOUS
  Filled 2019-12-12: qty 1

## 2019-12-12 MED ORDER — ONDANSETRON HCL 4 MG/2ML IJ SOLN
4.0000 mg | Freq: Once | INTRAMUSCULAR | Status: AC
  Administered 2019-12-13: 4 mg via INTRAVENOUS
  Filled 2019-12-12: qty 2

## 2019-12-12 NOTE — ED Provider Notes (Signed)
[facility] DEPT Provider Note   CSN: [id number] Arrival date & time: 12/12/19  2309     History Chief Complaint  Patient presents with  . Abdominal Pain  . Nausea  . Emesis    Richard [name] is a 54 y.o. male.  Patient presents to the emergency department for evaluation of abdominal pain.  Patient reports that he has been experiencing diffuse abdominal pain, some abdominal distention and sensation of constipation.  He reports that he stays nauseated and vomits when he wakes up in the morning.  Sometimes he sees small amounts of blood in the vomit.  He has had a poor appetite and has had a significant weight loss.        Past Medical History:  Diagnosis Date  . Acid reflux   . Colon cancer ([place])   . ETOH abuse   . HTN (hypertension)   . Pancreatitis 08/2018  . PUD (peptic ulcer disease)     Patient Active Problem List   Diagnosis Date Noted  . SVT (supraventricular tachycardia) ([place])   . Hypomagnesemia   . Hypophosphatemia   . High anion gap metabolic acidosis 68/09/7516  . Acute pancreatitis 08/14/2018  . Smoker 08/14/2018  . GERD (gastroesophageal reflux disease) 08/14/2018  . HTN (hypertension) 08/14/2018  . Abdominal pain 06/27/2018  . Hypertensive urgency 06/27/2018  . Elevated troponin 06/27/2018  . Hypoglycemia 06/27/2018  . Protein-calorie malnutrition, severe 05/02/2018  . Pancreatitis 05/01/2018  . Occult blood positive stool 11/13/2017  . Alcoholic ketoacidosis 00/17/4944  . Hepatic steatosis 08/21/2016  . Thrombocytopenia ([place]) 08/21/2016  . Alcohol abuse 08/21/2016  . Alcohol withdrawal delirium ([place]) 08/20/2016  . Dehydration 08/20/2016  . Intractable nausea and vomiting 08/20/2016  . Lactic acidosis 08/20/2016  . Aspiration pneumonia ([place]) 08/20/2016  . Sepsis ([place]) 08/20/2016    Past Surgical History:  Procedure Laterality Date  . LAPAROSCOPIC SIGMOID COLECTOMY  2007       Family History  Problem  Relation Age of Onset  . Colon cancer Father   . Cancer Sister   . CAD Neg Hx   . Stroke Neg Hx   . Diabetes Neg Hx     Social History   Tobacco Use  . Smoking status: Current Every Day Smoker    Packs/day: 1.00  . Smokeless tobacco: Never Used  Substance Use Topics  . Alcohol use: Yes    Alcohol/week: 2.0 standard drinks    Types: 1 Cans of beer, 1 Shots of liquor per week    Comment: heavy alcohol abuse a beer and couple of shots a day for past  14 years  . Drug use: No    Home Medications Prior to Admission medications   Medication Sig Start Date End Date Taking? Authorizing Provider  acetaminophen (TYLENOL) 500 MG tablet Take 1,000 mg by mouth every 6 (six) hours as needed for mild pain, moderate pain or headache.   Yes [provider]  folic acid (FOLVITE) 1 MG tablet Take 1 tablet (1 mg total) by mouth daily. Patient not taking: Reported on 11/05/2018 07/20/18   Fulp, Ander Gaster, MD  losartan (COZAAR) 100 MG tablet Take 1 tablet (100 mg total) by mouth daily. Patient not taking: Reported on 12/13/2019 11/11/18   Mosetta Anis, MD  naltrexone (DEPADE) 50 MG tablet Take 1 tablet (50 mg total) by mouth daily. Patient not taking: Reported on 12/13/2019 11/10/18   Mosetta Anis, MD  pantoprazole (PROTONIX) 40 MG tablet Take 1 tablet (40  mg total) by mouth daily. Patient not taking: Reported on 12/13/2019 11/11/18   Mosetta Anis, MD  thiamine 100 MG tablet Take 1 tablet (100 mg total) by mouth daily. Patient not taking: Reported on 11/05/2018 07/20/18   Antony Blackbird, MD    Allergies    Aspirin and Penicillins  Review of Systems   Review of Systems  Constitutional: Positive for appetite change and unexpected weight change.  Gastrointestinal: Positive for abdominal distention, abdominal pain, constipation, nausea and vomiting.  All other systems reviewed and are negative.   Physical Exam Updated Vital Signs BP 103/71   Pulse 90   Temp 98.3 F (36.8 C) (Oral)   Resp 20    Ht 6' (1.829 m)   Wt 54.4 kg   SpO2 96%   BMI 16.27 kg/m   Physical Exam Vitals and nursing note reviewed.  Constitutional:      General: He is not in acute distress.    Appearance: He is well-developed.  HENT:     Head: Normocephalic and atraumatic.     Right Ear: Hearing normal.     Left Ear: Hearing normal.     Nose: Nose normal.  Eyes:     Conjunctiva/sclera: Conjunctivae normal.     Pupils: Pupils are equal, round, and reactive to light.  Cardiovascular:     Rate and Rhythm: Regular rhythm.     Heart sounds: S1 normal and S2 normal. No murmur. No friction rub. No gallop.   Pulmonary:     Effort: Pulmonary effort is normal. No respiratory distress.     Breath sounds: Normal breath sounds.  Chest:     Chest wall: No tenderness.  Abdominal:     General: Bowel sounds are normal. There is distension.     Palpations: Abdomen is soft.     Tenderness: There is generalized abdominal tenderness. There is guarding. There is no rebound. Negative signs include Murphy's sign and McBurney's sign.     Hernia: No hernia is present.  Musculoskeletal:        General: Normal range of motion.     Cervical back: Normal range of motion and neck supple.  Skin:    General: Skin is warm and dry.     Findings: No rash.  Neurological:     Mental Status: He is alert and oriented to person, place, and time.     GCS: GCS eye subscore is 4. GCS verbal subscore is 5. GCS motor subscore is 6.     Cranial Nerves: No cranial nerve deficit.     Sensory: No sensory deficit.     Coordination: Coordination normal.  Psychiatric:        Speech: Speech normal.        Behavior: Behavior normal.        Thought Content: Thought content normal.     ED Results / Procedures / Treatments   Labs (all labs ordered are listed, but only abnormal results are displayed) Labs Reviewed  CBC WITH DIFFERENTIAL/PLATELET - Abnormal; Notable for the following components:      Result Value   WBC 17.5 (*)    RBC 2.92  (*)    Hemoglobin 9.2 (*)    HCT 26.9 (*)    RDW 16.5 (*)    Neutro Abs 14.1 (*)    Monocytes Absolute 1.1 (*)    Abs Immature Granulocytes 0.13 (*)    All other components within normal limits  BASIC METABOLIC PANEL - Abnormal; Notable for the following  components:   Sodium 133 (*)    Potassium 3.0 (*)    Chloride 93 (*)    BUN <5 (*)    Creatinine, Ser 0.49 (*)    Calcium 8.3 (*)    Anion gap 16 (*)    All other components within normal limits  HEPATIC FUNCTION PANEL - Abnormal; Notable for the following components:   Albumin 2.5 (*)    AST 64 (*)    Alkaline Phosphatase 205 (*)    Total Bilirubin 4.6 (*)    Bilirubin, Direct 2.5 (*)    Indirect Bilirubin 2.1 (*)    All other components within normal limits  LACTIC ACID, PLASMA - Abnormal; Notable for the following components:   Lactic Acid, Venous 2.1 (*)    All other components within normal limits  URINALYSIS, ROUTINE W REFLEX MICROSCOPIC - Abnormal; Notable for the following components:   Color, Urine AMBER (*)    Bilirubin Urine MODERATE (*)    Ketones, ur 20 (*)    Protein, ur 30 (*)    Bacteria, UA RARE (*)    All other components within normal limits  ETHANOL - Abnormal; Notable for the following components:   Alcohol, Ethyl (B) 77 (*)    All other components within normal limits  BETA-HYDROXYBUTYRIC ACID - Abnormal; Notable for the following components:   Beta-Hydroxybutyric Acid 3.32 (*)    All other components within normal limits  RAPID URINE DRUG SCREEN, HOSP PERFORMED - Abnormal; Notable for the following components:   Opiates POSITIVE (*)    Tetrahydrocannabinol POSITIVE (*)    All other components within normal limits  PROTIME-INR - Abnormal; Notable for the following components:   Prothrombin Time 17.6 (*)    INR 1.5 (*)    All other components within normal limits  LIPASE, BLOOD  AMMONIA  TROPONIN I (HIGH SENSITIVITY)  TROPONIN I (HIGH SENSITIVITY)    EKG None  Radiology CT ABDOMEN PELVIS  W CONTRAST  Result Date: 12/13/2019 CLINICAL DATA:  54 year old male with distended abdomen, abnormal lactic acid. EXAM: CT ABDOMEN AND PELVIS WITH CONTRAST TECHNIQUE: Multidetector CT imaging of the abdomen and pelvis was performed using the standard protocol following bolus administration of intravenous contrast. CONTRAST:  132m OMNIPAQUE IOHEXOL 300 MG/ML  SOLN COMPARISON:  CT Abdomen and Pelvis 11/05/2018 and earlier. FINDINGS: Lower chest: Bilateral lower lobe pulmonary opacity most resembles atelectasis. No pericardial or pleural effusion. Hepatobiliary: Progressed hepatomegaly since last year (liver length now 24 cm) with new diffusely heterogeneous and abnormal liver enhancement. Geographic areas of hyperenhancement superimposed on chronic hepatic steatosis. The hepatic veins and portal venous system remain patent. No discrete or masslike liver lesion. The gallbladder is distended up to 6 cm diameter but there is no definite pericholecystic inflammation. Pancreas: Within normal limits. Spleen: Remains within normal limits, no splenomegaly. Adrenals/Urinary Tract: Normal adrenal glands. Bilateral renal enhancement and contrast excretion symmetric and within normal limits. Proximal ureters appear decompressed. No nephrolithiasis. Diminutive and unremarkable urinary bladder. Stomach/Bowel: Decompressed distal large bowel. There is a large bowel anastomosis in the left abdomen which appears similar to the CT last year. Similar gas and retained stool. The right colon appears decompressed although with mild generalized wall thickening. The terminal ileum has a more normal appearance. No dilated small bowel. Decompressed proximal stomach but at the distal gastric body there is circumferential gastric wall thickening with mucosal hyperenhancement (coronal image 32 and series 2, image 38). Fluid in the distal stomach. No free air. Small volume of free  fluid throughout the abdomen with simple fluid density. The  duodenum and proximal small bowel are within normal limits. Vascular/Lymphatic: Aortoiliac calcified atherosclerosis. Major arterial structures in the abdomen and pelvis are patent. Portal venous system remains patent. No lymphadenopathy. Reproductive: Negative. Other: Moderate to large volume of pelvic free fluid with simple fluid density. Musculoskeletal: No acute osseous abnormality identified. IMPRESSION: 1. Hepatomegaly with new highly heterogeneous liver enhancement superimposed on chronic hepatic steatosis. No hepatic vascular occlusion identified.  No discrete liver lesion. Superimposed new moderate volume ascites. Favor acute or acute on chronic Hepatitis. 2. Superimposed distended gallbladder, but no CT evidence of acute cholecystitis. 3. New circumferential wall thickening of the gastric body suggesting Acute Gastritis. Alternatively this might be reactive due to #1. 4. Mild generalized right colon wall thickening, favor reactive due to #1. No evidence of bowel obstruction. 5. Bilateral lung base opacity most resembles atelectasis. 6. Aortic Atherosclerosis (ICD10-I70.0). Electronically Signed   By: Genevie Ann M.D.   On: 12/13/2019 02:43    Procedures Procedures (including critical care time)  Medications Ordered in ED Medications  sodium chloride (PF) 0.9 % injection (has no administration in time range)  morphine 4 MG/ML injection 4 mg (4 mg Intravenous Given 12/13/19 0001)  ondansetron (ZOFRAN) injection 4 mg (4 mg Intravenous Given 12/13/19 0001)  iohexol (OMNIPAQUE) 300 MG/ML solution 100 mL (100 mLs Intravenous Contrast Given 12/13/19 0145)    ED Course  I have reviewed the triage vital signs and the nursing notes.  Pertinent labs & imaging results that were available during my care of the patient were reviewed by me and considered in my medical decision making (see chart for details).    MDM Rules/Calculators/A&P                      Patient presents to the emergency department for  evaluation of abdominal pain.  Patient has a long history of chronic alcoholism.  He reports that he has noticed that he has lost a lot of weight recently, has not had much of an appetite.  He has had frequent nausea and vomiting at times sees blood in the vomit.  No active emesis or hematemesis currently.  Vital signs are stable.  Abdominal exam reveals distention with diffuse tenderness and guarding.  Patient does have a leukocytosis.  Liver function tests with elevated alk phos and slightly elevated LFT, bilirubin.  Patient underwent CT scan to further evaluate.  Patient has significant changes in the liver from most recent CT 1 year ago.  He also has moderate volume ascites.  Based on his diffuse tenderness, consideration for spontaneous bacterial peritonitis.  Will empirically cover with Rocephin, admit for further management.  Final Clinical Impression(s) / ED Diagnoses Final diagnoses:  Generalized abdominal pain  Ascites due to alcoholic cirrhosis [facility])    Rx / DC Orders ED Discharge Orders    None       Tyr Franca, Gwenyth Allegra, MD 12/13/19 [phone]

## 2019-12-13 ENCOUNTER — Emergency Department (HOSPITAL_COMMUNITY): Payer: Self-pay

## 2019-12-13 ENCOUNTER — Inpatient Hospital Stay (HOSPITAL_COMMUNITY): Payer: Self-pay

## 2019-12-13 ENCOUNTER — Encounter (HOSPITAL_COMMUNITY): Payer: Self-pay

## 2019-12-13 DIAGNOSIS — K7011 Alcoholic hepatitis with ascites: Secondary | ICD-10-CM

## 2019-12-13 DIAGNOSIS — R109 Unspecified abdominal pain: Secondary | ICD-10-CM

## 2019-12-13 DIAGNOSIS — R1084 Generalized abdominal pain: Secondary | ICD-10-CM

## 2019-12-13 DIAGNOSIS — R188 Other ascites: Secondary | ICD-10-CM | POA: Diagnosis present

## 2019-12-13 DIAGNOSIS — Z85038 Personal history of other malignant neoplasm of large intestine: Secondary | ICD-10-CM

## 2019-12-13 DIAGNOSIS — D500 Iron deficiency anemia secondary to blood loss (chronic): Secondary | ICD-10-CM

## 2019-12-13 DIAGNOSIS — E43 Unspecified severe protein-calorie malnutrition: Secondary | ICD-10-CM

## 2019-12-13 HISTORY — DX: Other ascites: R18.8

## 2019-12-13 LAB — HEPATIC FUNCTION PANEL
ALT: 23 U/L (ref 0–44)
ALT: 19 U/L (ref 0–44)
AST: 64 U/L — ABNORMAL HIGH (ref 15–41)
AST: 65 U/L — ABNORMAL HIGH (ref 15–41)
Albumin: 2.5 g/dL — ABNORMAL LOW (ref 3.5–5.0)
Albumin: 2.3 g/dL — ABNORMAL LOW (ref 3.5–5.0)
Alkaline Phosphatase: 205 U/L — ABNORMAL HIGH (ref 38–126)
Alkaline Phosphatase: 191 U/L — ABNORMAL HIGH (ref 38–126)
Bilirubin, Direct: 2.5 mg/dL — ABNORMAL HIGH (ref 0.0–0.2)
Bilirubin, Direct: 2.1 mg/dL — ABNORMAL HIGH (ref 0.0–0.2)
Indirect Bilirubin: 2.1 mg/dL — ABNORMAL HIGH (ref 0.3–0.9)
Indirect Bilirubin: 2.1 mg/dL — ABNORMAL HIGH (ref 0.3–0.9)
Total Bilirubin: 4.6 mg/dL — ABNORMAL HIGH (ref 0.3–1.2)
Total Bilirubin: 4.2 mg/dL — ABNORMAL HIGH (ref 0.3–1.2)
Total Protein: 6.9 g/dL (ref 6.5–8.1)
Total Protein: 6.1 g/dL — ABNORMAL LOW (ref 6.5–8.1)

## 2019-12-13 LAB — CBG MONITORING, ED: Glucose-Capillary: 75 mg/dL (ref 70–99)

## 2019-12-13 LAB — BASIC METABOLIC PANEL
Anion gap: 16 — ABNORMAL HIGH (ref 5–15)
Anion gap: 15 (ref 5–15)
Anion gap: 14 (ref 5–15)
BUN: <5 mg/dL — ABNORMAL LOW (ref 6–20)
BUN: <5 mg/dL — ABNORMAL LOW (ref 6–20)
BUN: 5 mg/dL — ABNORMAL LOW (ref 6–20)
CO2: 24 mmol/L (ref 22–32)
CO2: 26 mmol/L (ref 22–32)
CO2: 24 mmol/L (ref 22–32)
Calcium: 8.3 mg/dL — ABNORMAL LOW (ref 8.9–10.3)
Calcium: 8 mg/dL — ABNORMAL LOW (ref 8.9–10.3)
Calcium: 7.9 mg/dL — ABNORMAL LOW (ref 8.9–10.3)
Chloride: 93 mmol/L — ABNORMAL LOW (ref 98–111)
Chloride: 91 mmol/L — ABNORMAL LOW (ref 98–111)
Chloride: 95 mmol/L — ABNORMAL LOW (ref 98–111)
Creatinine, Ser: 0.49 mg/dL — ABNORMAL LOW (ref 0.61–1.24)
Creatinine, Ser: 0.43 mg/dL — ABNORMAL LOW (ref 0.61–1.24)
Creatinine, Ser: 0.42 mg/dL — ABNORMAL LOW (ref 0.61–1.24)
GFR calc Af Amer: >60 mL/min (ref >60)
GFR calc Af Amer: >60 mL/min (ref >60)
GFR calc Af Amer: >60 mL/min (ref >60)
GFR calc non Af Amer: >60 mL/min (ref >60)
GFR calc non Af Amer: >60 mL/min (ref >60)
GFR calc non Af Amer: >60 mL/min (ref >60)
Glucose, Bld: 79 mg/dL (ref 70–99)
Glucose, Bld: 77 mg/dL (ref 70–99)
Glucose, Bld: 87 mg/dL (ref 70–99)
Potassium: 3 mmol/L — ABNORMAL LOW (ref 3.5–5.1)
Potassium: 3 mmol/L — ABNORMAL LOW (ref 3.5–5.1)
Potassium: 4 mmol/L (ref 3.5–5.1)
Sodium: 133 mmol/L — ABNORMAL LOW (ref 135–145)
Sodium: 132 mmol/L — ABNORMAL LOW (ref 135–145)
Sodium: 133 mmol/L — ABNORMAL LOW (ref 135–145)

## 2019-12-13 LAB — CBC WITH DIFFERENTIAL/PLATELET
Abs Immature Granulocytes: 0.13 10*3/uL — ABNORMAL HIGH (ref 0.00–0.07)
Basophils Absolute: 0.1 10*3/uL (ref 0.0–0.1)
Basophils Relative: 1 %
Eosinophils Absolute: 0.5 10*3/uL (ref 0.0–0.5)
Eosinophils Relative: 3 %
HCT: 26.9 % — ABNORMAL LOW (ref 39.0–52.0)
Hemoglobin: 9.2 g/dL — ABNORMAL LOW (ref 13.0–17.0)
Immature Granulocytes: 1 %
Lymphocytes Relative: 9 %
Lymphs Abs: 1.5 10*3/uL (ref 0.7–4.0)
MCH: 31.5 pg (ref 26.0–34.0)
MCHC: 34.2 g/dL (ref 30.0–36.0)
MCV: 92.1 fL (ref 80.0–100.0)
Monocytes Absolute: 1.1 10*3/uL — ABNORMAL HIGH (ref 0.1–1.0)
Monocytes Relative: 6 %
Neutro Abs: 14.1 10*3/uL — ABNORMAL HIGH (ref 1.7–7.7)
Neutrophils Relative %: 80 %
Platelets: 205 10*3/uL (ref 150–400)
RBC: 2.92 MIL/uL — ABNORMAL LOW (ref 4.22–5.81)
RDW: 16.5 % — ABNORMAL HIGH (ref 11.5–15.5)
WBC: 17.5 10*3/uL — ABNORMAL HIGH (ref 4.0–10.5)
nRBC: 0 % (ref 0.0–0.2)

## 2019-12-13 LAB — RAPID URINE DRUG SCREEN, HOSP PERFORMED
Amphetamines: NOT DETECTED
Barbiturates: NOT DETECTED
Benzodiazepines: NOT DETECTED
Cocaine: NOT DETECTED
Opiates: POSITIVE — AB
Tetrahydrocannabinol: POSITIVE — AB

## 2019-12-13 LAB — TROPONIN I (HIGH SENSITIVITY)
Troponin I (High Sensitivity): 5 ng/L (ref <18)
Troponin I (High Sensitivity): 6 ng/L (ref <18)

## 2019-12-13 LAB — URINALYSIS, ROUTINE W REFLEX MICROSCOPIC
Glucose, UA: NEGATIVE mg/dL
Hgb urine dipstick: NEGATIVE
Ketones, ur: 20 mg/dL — AB
Leukocytes,Ua: NEGATIVE
Nitrite: NEGATIVE
Protein, ur: 30 mg/dL — AB
Specific Gravity, Urine: 1.02 (ref 1.005–1.030)
pH: 6 (ref 5.0–8.0)

## 2019-12-13 LAB — CBC
HCT: 27.2 % — ABNORMAL LOW (ref 39.0–52.0)
Hemoglobin: 9.4 g/dL — ABNORMAL LOW (ref 13.0–17.0)
MCH: 32 pg (ref 26.0–34.0)
MCHC: 34.6 g/dL (ref 30.0–36.0)
MCV: 92.5 fL (ref 80.0–100.0)
Platelets: 181 10*3/uL (ref 150–400)
RBC: 2.94 MIL/uL — ABNORMAL LOW (ref 4.22–5.81)
RDW: 16.3 % — ABNORMAL HIGH (ref 11.5–15.5)
WBC: 15.1 10*3/uL — ABNORMAL HIGH (ref 4.0–10.5)
nRBC: 0 % (ref 0.0–0.2)

## 2019-12-13 LAB — HIV ANTIBODY (ROUTINE TESTING W REFLEX): HIV Screen 4th Generation wRfx: NONREACTIVE

## 2019-12-13 LAB — PROTIME-INR
INR: 1.5 — ABNORMAL HIGH (ref 0.8–1.2)
Prothrombin Time: 17.6 seconds — ABNORMAL HIGH (ref 11.4–15.2)

## 2019-12-13 LAB — LACTIC ACID, PLASMA: Lactic Acid, Venous: 2.1 mmol/L (ref 0.5–1.9)

## 2019-12-13 LAB — TYPE AND SCREEN
ABO/RH(D): B POS
Antibody Screen: NEGATIVE

## 2019-12-13 LAB — MAGNESIUM
Magnesium: 1.4 mg/dL — ABNORMAL LOW (ref 1.7–2.4)
Magnesium: 2.3 mg/dL (ref 1.7–2.4)

## 2019-12-13 LAB — ETHANOL: Alcohol, Ethyl (B): 77 mg/dL — ABNORMAL HIGH (ref <10)

## 2019-12-13 LAB — SARS CORONAVIRUS 2 (TAT 6-24 HRS): SARS Coronavirus 2: NEGATIVE

## 2019-12-13 LAB — GLUCOSE, CAPILLARY: Glucose-Capillary: 78 mg/dL (ref 70–99)

## 2019-12-13 LAB — HEPATITIS PANEL, ACUTE
HCV Ab: NONREACTIVE
Hep A IgM: NONREACTIVE
Hep B C IgM: NONREACTIVE
Hepatitis B Surface Ag: NONREACTIVE

## 2019-12-13 LAB — BETA-HYDROXYBUTYRIC ACID: Beta-Hydroxybutyric Acid: 3.32 mmol/L — ABNORMAL HIGH (ref 0.05–0.27)

## 2019-12-13 LAB — ABO/RH: ABO/RH(D): B POS

## 2019-12-13 LAB — LIPASE, BLOOD: Lipase: 32 U/L (ref 11–51)

## 2019-12-13 MED ORDER — SODIUM CHLORIDE 0.9 % IV SOLN
2.0000 g | Freq: Once | INTRAVENOUS | Status: AC
  Administered 2019-12-13: 2 g via INTRAVENOUS
  Filled 2019-12-13: qty 20

## 2019-12-13 MED ORDER — POTASSIUM CHLORIDE IN NACL 40-0.9 MEQ/L-% IV SOLN
INTRAVENOUS | Status: AC
  Administered 2019-12-13 (×2): 75 mL/h via INTRAVENOUS
  Filled 2019-12-13 (×2): qty 1000

## 2019-12-13 MED ORDER — LORAZEPAM 2 MG/ML IJ SOLN: 0.0000 mg | Freq: Two times a day (BID) | INTRAMUSCULAR | Status: DC

## 2019-12-13 MED ORDER — POLYETHYLENE GLYCOL 3350 17 GM/SCOOP PO POWD
0.5000 | Freq: Once | ORAL | Status: AC
  Administered 2019-12-13: 127.5 g via ORAL
  Filled 2019-12-13: qty 255

## 2019-12-13 MED ORDER — LORAZEPAM 2 MG/ML IJ SOLN: 0.0000 mg | Freq: Four times a day (QID) | INTRAMUSCULAR | Status: DC

## 2019-12-13 MED ORDER — LORAZEPAM 2 MG/ML IJ SOLN: 1.0000 mg | INTRAMUSCULAR | Status: DC | PRN

## 2019-12-13 MED ORDER — ONDANSETRON HCL 4 MG/2ML IJ SOLN: 4.0000 mg | Freq: Four times a day (QID) | INTRAMUSCULAR | Status: DC | PRN

## 2019-12-13 MED ORDER — MAGNESIUM SULFATE 4 GM/100ML IV SOLN
4.0000 g | Freq: Once | INTRAVENOUS | Status: AC
  Administered 2019-12-13: 4 g via INTRAVENOUS
  Filled 2019-12-13: qty 100

## 2019-12-13 MED ORDER — PEG-KCL-NACL-NASULF-NA ASC-C 100 G PO SOLR: 1.0000 | Freq: Once | ORAL | Status: DC

## 2019-12-13 MED ORDER — THIAMINE HCL 100 MG PO TABS: 100.0000 mg | ORAL_TABLET | Freq: Every day | ORAL | Status: DC

## 2019-12-13 MED ORDER — THIAMINE HCL 100 MG PO TABS
100.0000 mg | ORAL_TABLET | Freq: Every day | ORAL | Status: DC
  Administered 2019-12-14: 100 mg via ORAL
  Filled 2019-12-13: qty 1

## 2019-12-13 MED ORDER — POTASSIUM CHLORIDE 10 MEQ/100ML IV SOLN
10.0000 meq | INTRAVENOUS | Status: AC
  Administered 2019-12-13 (×6): 10 meq via INTRAVENOUS
  Filled 2019-12-13 (×6): qty 100

## 2019-12-13 MED ORDER — THIAMINE HCL 100 MG/ML IJ SOLN: 100.0000 mg | Freq: Every day | INTRAMUSCULAR | Status: DC

## 2019-12-13 MED ORDER — SODIUM CHLORIDE (PF) 0.9 % IJ SOLN
INTRAMUSCULAR | Status: AC
  Filled 2019-12-13: qty 50

## 2019-12-13 MED ORDER — NICOTINE 21 MG/24HR TD PT24
21.0000 mg | MEDICATED_PATCH | Freq: Every day | TRANSDERMAL | Status: DC
  Administered 2019-12-13 – 2019-12-14 (×2): 21 mg via TRANSDERMAL
  Filled 2019-12-13 (×2): qty 1

## 2019-12-13 MED ORDER — ACETAMINOPHEN 325 MG PO TABS: 650.0000 mg | ORAL_TABLET | Freq: Four times a day (QID) | ORAL | Status: DC | PRN

## 2019-12-13 MED ORDER — PANTOPRAZOLE SODIUM 40 MG IV SOLR
40.0000 mg | Freq: Two times a day (BID) | INTRAVENOUS | Status: DC
  Administered 2019-12-13 (×2): 40 mg via INTRAVENOUS
  Filled 2019-12-13 (×2): qty 40

## 2019-12-13 MED ORDER — ADULT MULTIVITAMIN W/MINERALS CH
1.0000 | ORAL_TABLET | Freq: Every day | ORAL | Status: DC
  Administered 2019-12-13 – 2019-12-14 (×2): 1 via ORAL
  Filled 2019-12-13 (×2): qty 1

## 2019-12-13 MED ORDER — THIAMINE HCL 100 MG/ML IJ SOLN
100.0000 mg | Freq: Every day | INTRAMUSCULAR | Status: DC
  Administered 2019-12-13: 100 mg via INTRAVENOUS
  Filled 2019-12-13: qty 2

## 2019-12-13 MED ORDER — SODIUM CHLORIDE 0.9 % IV SOLN: INTRAVENOUS | Status: DC

## 2019-12-13 MED ORDER — POTASSIUM CHLORIDE 10 MEQ/100ML IV SOLN: 10.0000 meq | INTRAVENOUS | Status: DC

## 2019-12-13 MED ORDER — LORAZEPAM 1 MG PO TABS: 1.0000 mg | ORAL_TABLET | ORAL | Status: DC | PRN

## 2019-12-13 MED ORDER — PEG-KCL-NACL-NASULF-NA ASC-C 100 G PO SOLR
0.5000 | Freq: Once | ORAL | Status: AC
  Administered 2019-12-13: 100 g via ORAL
  Filled 2019-12-13: qty 1

## 2019-12-13 MED ORDER — LORAZEPAM 1 MG PO TABS: 0.0000 mg | ORAL_TABLET | Freq: Two times a day (BID) | ORAL | Status: DC

## 2019-12-13 MED ORDER — SODIUM CHLORIDE 0.9 % IV SOLN
2.0000 g | INTRAVENOUS | Status: DC
  Administered 2019-12-13: 2 g via INTRAVENOUS
  Filled 2019-12-13: qty 2

## 2019-12-13 MED ORDER — IOHEXOL 300 MG/ML  SOLN
100.0000 mL | Freq: Once | INTRAMUSCULAR | Status: AC | PRN
  Administered 2019-12-13: 100 mL via INTRAVENOUS

## 2019-12-13 MED ORDER — MORPHINE SULFATE (PF) 2 MG/ML IV SOLN
2.0000 mg | INTRAVENOUS | Status: DC | PRN
  Administered 2019-12-13 – 2019-12-14 (×4): 2 mg via INTRAVENOUS
  Filled 2019-12-13 (×4): qty 1

## 2019-12-13 MED ORDER — PEG-KCL-NACL-NASULF-NA ASC-C 100 G PO SOLR
0.5000 | Freq: Once | ORAL | Status: AC
  Administered 2019-12-14: 100 g via ORAL

## 2019-12-13 MED ORDER — ONDANSETRON HCL 4 MG PO TABS: 4.0000 mg | ORAL_TABLET | Freq: Four times a day (QID) | ORAL | Status: DC | PRN

## 2019-12-13 MED ORDER — FOLIC ACID 1 MG PO TABS
1.0000 mg | ORAL_TABLET | Freq: Every day | ORAL | Status: DC
  Administered 2019-12-13 – 2019-12-14 (×2): 1 mg via ORAL
  Filled 2019-12-13 (×2): qty 1

## 2019-12-13 MED ORDER — LORAZEPAM 1 MG PO TABS: 0.0000 mg | ORAL_TABLET | Freq: Four times a day (QID) | ORAL | Status: DC

## 2019-12-13 MED ORDER — ACETAMINOPHEN 650 MG RE SUPP: 650.0000 mg | Freq: Four times a day (QID) | RECTAL | Status: DC | PRN

## 2019-12-13 NOTE — ED Notes (Signed)
Pt lying in bed, eye's closed, chest rising an falling. Monitor on. Pt will continue to monitor pt.

## 2019-12-13 NOTE — ED Triage Notes (Signed)
Pt brought in by EMS for vomiting and abd pain. Hx of pancreatitis.

## 2019-12-13 NOTE — ED Notes (Signed)
Pt made aware that urine sample is needed.

## 2019-12-13 NOTE — H&P (View-Only) (Signed)
Referring Provider:  Dr. Algis Liming, [facility] Primary Care Physician:  Antony Blackbird, MD Primary Gastroenterologist:  Dr. Henrene Pastor in 2007  Reason for Consultation:  Hematemesis  HPI: Richard [name] is a 54 y.o. male PMH of alcohol and tobacco dependence, pancreatitis, dilated cardiomyopathy with last LVEF 30-35% last year, GERD/PUD, HTN, history of colon cancer in 2007 diagnosed by Dr. Henrene Pastor.  Since then he has followed with [facility] and had a colonoscopy in 2015, but according to [facility] office he has been discharged from [place] [facility].  Anyway, he also has medical history of ETOH abuse.    He presented to [facility] long hospital with complaints of abdominal pain, nausea, vomiting.  He tells me that he has vomiting regularly almost on a daily basis, sometimes multiple times a day.  Has noted some blood in his vomitus in the form of red blood streaks at times.  Admits to drinking alcohol daily when asked how much he says "too much".  Labs show hemoglobin 9.2 g, platelets 205, creatinine 0.4, potassium 3, albumin 2.5, alk phos 205, total bili 4.2, lipase normal, white blood cell count elevated at 17.5.  CT scan of the abdomen pelvis with contrast showed the following:  IMPRESSION: 1. Hepatomegaly with new highly heterogeneous liver enhancement superimposed on chronic hepatic steatosis. No hepatic vascular occlusion identified.  No discrete liver lesion.  Superimposed new moderate volume ascites. Favor acute or acute on chronic Hepatitis.  2. Superimposed distended gallbladder, but no CT evidence of acute cholecystitis.  3. New circumferential wall thickening of the gastric body suggesting Acute Gastritis. Alternatively this might be reactive due to #1.  4. Mild generalized right colon wall thickening, favor reactive due to #1. No evidence of bowel obstruction.  5. Bilateral lung base opacity most resembles atelectasis.  6. Aortic Atherosclerosis (ICD10-I70.0).   Not enough fluid for  paracentesis.  In light of leukocytosis and ascites he has been started on Rocephin for possible SBP.   Past Medical History:  Diagnosis Date  . Acid reflux   . Colon cancer ([place])   . ETOH abuse   . HTN (hypertension)   . Pancreatitis 08/2018  . PUD (peptic ulcer disease)     Past Surgical History:  Procedure Laterality Date  . LAPAROSCOPIC SIGMOID COLECTOMY  2007    Prior to Admission medications   Medication Sig Start Date End Date Taking? Authorizing Provider  acetaminophen (TYLENOL) 500 MG tablet Take 1,000 mg by mouth every 6 (six) hours as needed for mild pain, moderate pain or headache.   Yes [provider]  folic acid (FOLVITE) 1 MG tablet Take 1 tablet (1 mg total) by mouth daily. Patient not taking: Reported on 11/05/2018 07/20/18   Fulp, Ander Gaster, MD  losartan (COZAAR) 100 MG tablet Take 1 tablet (100 mg total) by mouth daily. Patient not taking: Reported on 12/13/2019 11/11/18   Mosetta Anis, MD  naltrexone (DEPADE) 50 MG tablet Take 1 tablet (50 mg total) by mouth daily. Patient not taking: Reported on 12/13/2019 11/10/18   Mosetta Anis, MD  pantoprazole (PROTONIX) 40 MG tablet Take 1 tablet (40 mg total) by mouth daily. Patient not taking: Reported on 12/13/2019 11/11/18   Mosetta Anis, MD  thiamine 100 MG tablet Take 1 tablet (100 mg total) by mouth daily. Patient not taking: Reported on 11/05/2018 07/20/18   Antony Blackbird, MD    Current Facility-Administered Medications  Medication Dose Route Frequency Provider Last Rate Last Admin  . 0.9 % NaCl  with KCl 40 mEq / L  infusion   Intravenous Continuous Hongalgi, Anand D, MD      . acetaminophen (TYLENOL) tablet 650 mg  650 mg Oral Q6H PRN [name], MD       Or  . acetaminophen (TYLENOL) suppository 650 mg  650 mg Rectal Q6H PRN [name], MD      . cefTRIAXone (ROCEPHIN) 2 g in sodium chloride 0.9 % 100 mL IVPB  2 g Intravenous Q24H [name], MD      . folic acid (FOLVITE) tablet 1  mg  1 mg Oral Daily [name], MD      . LORazepam (ATIVAN) injection 0-4 mg  0-4 mg Intravenous Q6H Hongalgi, Lenis Dickinson, MD       Followed by  . [START ON 12/15/2019] LORazepam (ATIVAN) injection 0-4 mg  0-4 mg Intravenous Q12H Hongalgi, Anand D, MD      . LORazepam (ATIVAN) tablet 1-4 mg  1-4 mg Oral Q1H PRN [name], MD       Or  . LORazepam (ATIVAN) injection 1-4 mg  1-4 mg Intravenous Q1H PRN [name], MD      . magnesium sulfate IVPB 4 g 100 mL  4 g Intravenous Once Hongalgi, Everlene Farrier D, MD      . morphine 2 MG/ML injection 2 mg  2 mg Intravenous Q4H PRN Hongalgi, Lenis Dickinson, MD      . multivitamin with minerals tablet 1 tablet  1 tablet Oral Daily [name], MD      . nicotine (NICODERM CQ - dosed in mg/24 hours) patch 21 mg  21 mg Transdermal Daily Hongalgi, Lenis Dickinson, MD      . ondansetron (ZOFRAN) tablet 4 mg  4 mg Oral Q6H PRN [name], MD       Or  . ondansetron [facility]) injection 4 mg  4 mg Intravenous Q6H PRN [name], MD      . pantoprazole (PROTONIX) injection 40 mg  40 mg Intravenous Q12H Gean Birchwood N, MD      . potassium chloride 10 mEq in 100 mL IVPB  10 mEq Intravenous Q1 Hr x 6 Hongalgi, Anand D, MD 100 mL/hr at 12/13/19 0800 10 mEq at 12/13/19 0800  . sodium chloride (PF) 0.9 % injection           . thiamine tablet 100 mg  100 mg Oral Daily [name], MD       Or  . thiamine (B-1) injection 100 mg  100 mg Intravenous Daily [name], MD       Current Outpatient Medications  Medication Sig Dispense Refill  . acetaminophen (TYLENOL) 500 MG tablet Take 1,000 mg by mouth every 6 (six) hours as needed for mild pain, moderate pain or headache.    . folic acid (FOLVITE) 1 MG tablet Take 1 tablet (1 mg total) by mouth daily. (Patient not taking: Reported on 11/05/2018) 30 tablet 6  . losartan (COZAAR) 100 MG tablet Take 1 tablet (100 mg total) by mouth daily. (Patient not taking: Reported on  12/13/2019) 90 tablet 0  . naltrexone (DEPADE) 50 MG tablet Take 1 tablet (50 mg total) by mouth daily. (Patient not taking: Reported on 12/13/2019) 90 tablet 0  . pantoprazole (PROTONIX) 40 MG tablet Take 1 tablet (40 mg total) by mouth daily. (Patient not taking: Reported on 12/13/2019) 90 tablet 0  . thiamine 100 MG tablet Take 1  tablet (100 mg total) by mouth daily. (Patient not taking: Reported on 11/05/2018) 30 tablet 6    Allergies as of 12/12/2019 - Review Complete 12/12/2019  Allergen Reaction Noted  . Aspirin Other (See Comments) 08/20/2016  . Penicillins Hives 01/09/2012    Family History  Problem Relation Age of Onset  . Colon cancer Father   . Cancer Sister   . CAD Neg Hx   . Stroke Neg Hx   . Diabetes Neg Hx     Social History   Socioeconomic History  . Marital status: Single    Spouse name: Not on file  . Number of children: Not on file  . Years of education: Not on file  . Highest education level: Not on file  Occupational History  . Not on file  Tobacco Use  . Smoking status: Current Every Day Smoker    Packs/day: 1.00  . Smokeless tobacco: Never Used  Substance and Sexual Activity  . Alcohol use: Yes    Alcohol/week: 2.0 standard drinks    Types: 1 Cans of beer, 1 Shots of liquor per week    Comment: heavy alcohol abuse a beer and couple of shots a day for past  14 years  . Drug use: No  . Sexual activity: Not Currently  Other Topics Concern  . Not on file  Social History Narrative  . Not on file   Social Determinants of Health   Financial Resource Strain:   . Difficulty of Paying Living Expenses:   Food Insecurity:   . Worried About Charity fundraiser in the Last Year:   . Arboriculturist in the Last Year:   Transportation Needs:   . Film/video editor (Medical):   Marland Kitchen Lack of Transportation (Non-Medical):   Physical Activity:   . Days of Exercise per Week:   . Minutes of Exercise per Session:   Stress:   . Feeling of Stress :   Social  Connections:   . Frequency of Communication with Friends and Family:   . Frequency of Social Gatherings with Friends and Family:   . Attends Religious Services:   . Active Member of Clubs or Organizations:   . Attends Archivist Meetings:   Marland Kitchen Marital Status:   Intimate Partner Violence:   . Fear of Current or Ex-Partner:   . Emotionally Abused:   Marland Kitchen Physically Abused:   . Sexually Abused:     Review of Systems: ROS is O/W negative except as mentioned in HPI.  Physical Exam: Vital signs in last 24 hours: Temp:  [98.3 F (36.8 C)] 98.3 F (36.8 C) (03/11 2321) Pulse Rate:  [85-97] 87 (03/12 0645) Resp:  [12-24] 16 (03/12 0645) BP: (93-113)/(62-76) 105/73 (03/12 0645) SpO2:  [93 %-100 %] 96 % (03/12 0645) Weight:  [54.4 kg] 54.4 kg (03/11 2321)   General:  Alert, chronically ill-appearing, pleasant and cooperative in NAD; appears malnourished. Head:  Normocephalic and atraumatic. Eyes:  Sclera clear, no icterus.  Conjunctiva pink. Ears:  Normal auditory acuity. Mouth:  No deformity or lesions.   Lungs:  Clear throughout to auscultation.  No wheezes, crackles, or rhonchi.  Heart:  Regular rate and rhythm; no murmurs, clicks, rubs, or gallops. Abdomen:  Soft, slightly distended.  BS present.  Non-tender.  Scar noted on lower abdomen from previous surgery.  Msk:  Symmetrical without gross deformities. Pulses:  Normal pulses noted. Extremities:  Without clubbing or edema. Neurologic:  Alert and oriented x 4;  grossly  normal neurologically. Skin:  Intact without significant lesions or rashes. Psych:  Alert and cooperative. Normal mood and affect.  Intake/Output from previous day: 03/11 0701 - 03/12 0700 In: 100 [IV Piggyback:100] Out: -   Lab Results: Recent Labs    12/13/19 0001 12/13/19 0509  WBC 17.5* 15.1*  HGB 9.2* 9.4*  HCT 26.9* 27.2*  PLT 205 181   BMET Recent Labs    12/13/19 0001 12/13/19 0509  NA 133* 132*  K 3.0* 3.0*  CL 93* 91*  CO2 24  26  GLUCOSE 79 77  BUN <5* <5*  CREATININE 0.49* 0.43*  CALCIUM 8.3* 8.0*   LFT Recent Labs    12/13/19 0509  PROT 6.1*  ALBUMIN 2.3*  AST 65*  ALT 19  ALKPHOS 191*  BILITOT 4.2*  BILIDIR 2.1*  IBILI 2.1*   PT/INR Recent Labs    12/13/19 0001  LABPROT 17.6*  INR 1.5*   Studies/Results: CT ABDOMEN PELVIS W CONTRAST  Result Date: 12/13/2019 CLINICAL DATA:  54 year old male with distended abdomen, abnormal lactic acid. EXAM: CT ABDOMEN AND PELVIS WITH CONTRAST TECHNIQUE: Multidetector CT imaging of the abdomen and pelvis was performed using the standard protocol following bolus administration of intravenous contrast. CONTRAST:  155m OMNIPAQUE IOHEXOL 300 MG/ML  SOLN COMPARISON:  CT Abdomen and Pelvis 11/05/2018 and earlier. FINDINGS: Lower chest: Bilateral lower lobe pulmonary opacity most resembles atelectasis. No pericardial or pleural effusion. Hepatobiliary: Progressed hepatomegaly since last year (liver length now 24 cm) with new diffusely heterogeneous and abnormal liver enhancement. Geographic areas of hyperenhancement superimposed on chronic hepatic steatosis. The hepatic veins and portal venous system remain patent. No discrete or masslike liver lesion. The gallbladder is distended up to 6 cm diameter but there is no definite pericholecystic inflammation. Pancreas: Within normal limits. Spleen: Remains within normal limits, no splenomegaly. Adrenals/Urinary Tract: Normal adrenal glands. Bilateral renal enhancement and contrast excretion symmetric and within normal limits. Proximal ureters appear decompressed. No nephrolithiasis. Diminutive and unremarkable urinary bladder. Stomach/Bowel: Decompressed distal large bowel. There is a large bowel anastomosis in the left abdomen which appears similar to the CT last year. Similar gas and retained stool. The right colon appears decompressed although with mild generalized wall thickening. The terminal ileum has a more normal appearance.  No dilated small bowel. Decompressed proximal stomach but at the distal gastric body there is circumferential gastric wall thickening with mucosal hyperenhancement (coronal image 32 and series 2, image 38). Fluid in the distal stomach. No free air. Small volume of free fluid throughout the abdomen with simple fluid density. The duodenum and proximal small bowel are within normal limits. Vascular/Lymphatic: Aortoiliac calcified atherosclerosis. Major arterial structures in the abdomen and pelvis are patent. Portal venous system remains patent. No lymphadenopathy. Reproductive: Negative. Other: Moderate to large volume of pelvic free fluid with simple fluid density. Musculoskeletal: No acute osseous abnormality identified. IMPRESSION: 1. Hepatomegaly with new highly heterogeneous liver enhancement superimposed on chronic hepatic steatosis. No hepatic vascular occlusion identified.  No discrete liver lesion. Superimposed new moderate volume ascites. Favor acute or acute on chronic Hepatitis. 2. Superimposed distended gallbladder, but no CT evidence of acute cholecystitis. 3. New circumferential wall thickening of the gastric body suggesting Acute Gastritis. Alternatively this might be reactive due to #1. 4. Mild generalized right colon wall thickening, favor reactive due to #1. No evidence of bowel obstruction. 5. Bilateral lung base opacity most resembles atelectasis. 6. Aortic Atherosclerosis (ICD10-I70.0). Electronically Signed   By: HGenevie AnnM.D.   On: 12/13/2019  02:43   IMPRESSION:  *Vomiting/hematemesis: Patient reports frequent daily vomiting.  Hematemesis may be from some esophagitis related to his frequent vomiting episodes.  Possible gastritis seen on CT scan. 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  Hypoalbuminemia.  Ascites, not able to receive paracentesis due to minimal/non-accessible fluid. *ETOH abuse: Using alcohol until just a couple days  ago.  When asked how much that he drinks he says "too much". *History of colon cancer in 2007 status post resection.  Last colonoscopy 2014 or 2015 at [facility]. *Dilated cardiomyopathy with a EF of 30 to 35% last year.  PLAN: *We will plan for both EGD and colonoscopy tomorrow. *Needs ETOH abstinence. *Pantoprazole 40 mg IV twice daily for now. *In light of ascites and leukocytosis he has been started on Rocephin for possible SBP.  Can continue for now.  Laban Emperor. Zehr  12/13/2019, 9:43 AM   Attending physician's note   I have taken a history, examined the patient and reviewed the chart. I agree with the Advanced Practitioner's note, impression and recommendations.  54 year old male with alcoholism admitted with vomiting, hematemesis intermittently and melena few weeks ago. Hemoglobin 9.4 on admission  Total bilirubin 4.6, INR 1.5, albumin 2.5  Discriminate function score less than 32, no indication for steroids  Leukocytosis, WBC count 15.1 Small volume ascites, not amenable for diagnostic paracentesis He is on prophylactic ceftriaxone for SBP, okay to continue for now  History of colon cancer, last colonoscopy> 5 years ago at [place], records not available to review.  We will schedule for EGD and colonoscopy to evaluate for heme positive anemia, hematemesis and melena  Dilated cardiomyopathy with EF 30 to 35%  Discussed alcohol cessation, patient wants to go to rehab after his discharge from the hospital  The risks and benefits as well as alternatives of endoscopic procedure(s) have been discussed and reviewed. All questions answered. The patient agrees to proceed.  Damaris Hippo , MD [phone]

## 2019-12-13 NOTE — ED Notes (Signed)
Pt lying in bed, eye's closed. Pt asking for more pain meds. Monitor on. Will check with MD. Pt denies any other needs

## 2019-12-13 NOTE — ED Notes (Signed)
Pt lying in bed, eye's closed, chest rising and falling. Full monitor on. NAD noted. Will continue to monitor.

## 2019-12-13 NOTE — Progress Notes (Signed)
Pt arrived to room 1512 from ED. VS are stable.

## 2019-12-13 NOTE — Progress Notes (Signed)
Patient ID: Richard [name], male   DOB: 12-May-1966, 54 y.o.   MRN: [id number] Patient presented to ultrasound department today for paracentesis.  On limited ultrasound exam of abdomen in all 4 quadrants there is a small amount of ascites present, primarily deep in pelvis but overlying bowel loops prevent safe access at this time.  Procedure canceled.  Can reexamine at a later date if necessary for paracentesis if accessible fluid present.

## 2019-12-13 NOTE — H&P (Addendum)
History and Physical    Richard [name] [id number] DOB: 30-May-1966 DOA: 12/12/2019  PCP: Antony Blackbird, MD  Patient coming from: Home.  Chief Complaint: Abdominal pain nausea vomiting.  HPI: Richard [name] is a 54 y.o. male with history of stage II colon cancer status post colectomy in remission, alcohol abuse with history of alcoholic induced pancreatitis with history of dilated cardiomyopathy last EF was 30 to 35% last year presents to the ER because of ongoing abdominal pain with nausea vomiting over the last 8 days.  Patient states that abdominal pain is diffuse with at least 3-4 episodes of vomiting every day.  Denies any diarrhea.  Patient at times had noted some blood in the vomitus.  Patient admits to drinking alcohol every day.  Denies having any EGD previously.  Patient is a poor historian.  ED Course: In the ER labs show hemoglobin of 9.2 platelets 205 lactic acid 2.1 creatinine 0.4 potassium 3 albumin 2.5 alkaline phosphatase 205 direct bili 2.5 indirect bili 2.1 GFR was normal.  Isolated troponin was 5 and 6.  Lipase was normal.  CT abdomen pelvis was done which shows features concerning for new heterogeneous enhancement of liver on chronic liver findings.  In addition there is also finding concerning for gastritis.  Moderate volume ascites.  With that patient has features concerning for possible SBP patient was started on ceftriaxone.  Covid test is pending.  Patient admitted for further work-up.  Patient alcohol also positive.  Beta hydroxybutyric acid was 3.3.  Review of Systems: As per HPI, rest all negative.   Past Medical History:  Diagnosis Date  . Acid reflux   . Colon cancer ([place])   . ETOH abuse   . HTN (hypertension)   . Pancreatitis 08/2018  . PUD (peptic ulcer disease)     Past Surgical History:  Procedure Laterality Date  . LAPAROSCOPIC SIGMOID COLECTOMY  2007     reports that he has been smoking. He has been smoking about 1.00 pack per day. He has never  used smokeless tobacco. He reports current alcohol use of about 2.0 standard drinks of alcohol per week. He reports that he does not use drugs.  Allergies  Allergen Reactions  . Aspirin Other (See Comments)    Acid reflux   . Penicillins Hives    Has patient had a PCN reaction causing immediate rash, facial/tongue/throat swelling, SOB or lightheadedness with hypotension: yes Has patient had a PCN reaction causing severe rash involving mucus membranes or skin necrosis: no Has patient had a PCN reaction that required hospitalization: no Has patient had a PCN reaction occurring within the last 10 years: no If all of the above answers are "NO", then may proceed with Cephalosporin use.     Family History  Problem Relation Age of Onset  . Colon cancer Father   . Cancer Sister   . CAD Neg Hx   . Stroke Neg Hx   . Diabetes Neg Hx     Prior to Admission medications   Medication Sig Start Date End Date Taking? Authorizing Provider  acetaminophen (TYLENOL) 500 MG tablet Take 1,000 mg by mouth every 6 (six) hours as needed for mild pain, moderate pain or headache.   Yes [provider]  folic acid (FOLVITE) 1 MG tablet Take 1 tablet (1 mg total) by mouth daily. Patient not taking: Reported on 11/05/2018 07/20/18   Fulp, Ander Gaster, MD  losartan (COZAAR) 100 MG tablet Take 1 tablet (100 mg total) by mouth  daily. Patient not taking: Reported on 12/13/2019 11/11/18   Mosetta Anis, MD  naltrexone (DEPADE) 50 MG tablet Take 1 tablet (50 mg total) by mouth daily. Patient not taking: Reported on 12/13/2019 11/10/18   Mosetta Anis, MD  pantoprazole (PROTONIX) 40 MG tablet Take 1 tablet (40 mg total) by mouth daily. Patient not taking: Reported on 12/13/2019 11/11/18   Mosetta Anis, MD  thiamine 100 MG tablet Take 1 tablet (100 mg total) by mouth daily. Patient not taking: Reported on 11/05/2018 07/20/18   Antony Blackbird, MD    Physical Exam: Constitutional: Moderately built and nourished. Vitals:    12/13/19 0345 12/13/19 0400 12/13/19 0404 12/13/19 0405  BP: 99/66 100/70 100/70 100/70  Pulse: 91 95  95  Resp: (!) 23 15    Temp:      TempSrc:      SpO2: 97% 98%    Weight:      Height:       Eyes: Anicteric no pallor. ENMT: No discharge from the ears eyes nose or mouth. Neck: No mass felt.  No neck rigidity. Respiratory: No rhonchi or crepitations. Cardiovascular: S1-S2 heard. Abdomen: Mildly distended slightly tender no rigidity no guarding. Musculoskeletal: No edema. Skin: No rash. Neurologic: Patient is alert awake but not very cooperative with history.  Moves all extremities. Psychiatric: Alert awake oriented to name and place.   Labs on Admission: I have personally reviewed following labs and imaging studies  CBC: Recent Labs  Lab 12/13/19 0001  WBC 17.5*  NEUTROABS 14.1*  HGB 9.2*  HCT 26.9*  MCV 92.1  PLT 99991111   Basic Metabolic Panel: Recent Labs  Lab 12/13/19 0001  NA 133*  K 3.0*  CL 93*  CO2 24  GLUCOSE 79  BUN <5*  CREATININE 0.49*  CALCIUM 8.3*   GFR: Estimated Creatinine Clearance: 83.1 mL/min (A) (by C-G formula based on SCr of 0.49 mg/dL (L)). Liver Function Tests: Recent Labs  Lab 12/13/19 0001  AST 64*  ALT 23  ALKPHOS 205*  BILITOT 4.6*  PROT 6.9  ALBUMIN 2.5*   Recent Labs  Lab 12/13/19 0001  LIPASE 32   No results for input(s): AMMONIA in the last 168 hours. Coagulation Profile: Recent Labs  Lab 12/13/19 0001  INR 1.5*   Cardiac Enzymes: No results for input(s): CKTOTAL, CKMB, CKMBINDEX, TROPONINI in the last 168 hours. BNP (last 3 results) No results for input(s): PROBNP in the last 8760 hours. HbA1C: No results for input(s): HGBA1C in the last 72 hours. CBG: No results for input(s): GLUCAP in the last 168 hours. Lipid Profile: No results for input(s): CHOL, HDL, LDLCALC, TRIG, CHOLHDL, LDLDIRECT in the last 72 hours. Thyroid Function Tests: No results for input(s): TSH, T4TOTAL, FREET4, T3FREE, THYROIDAB in  the last 72 hours. Anemia Panel: No results for input(s): VITAMINB12, FOLATE, FERRITIN, TIBC, IRON, RETICCTPCT in the last 72 hours. Urine analysis:    Component Value Date/Time   COLORURINE AMBER (A) 12/13/2019 0205   APPEARANCEUR CLEAR 12/13/2019 0205   LABSPEC 1.020 12/13/2019 0205   PHURINE 6.0 12/13/2019 0205   GLUCOSEU NEGATIVE 12/13/2019 0205   HGBUR NEGATIVE 12/13/2019 0205   BILIRUBINUR MODERATE (A) 12/13/2019 0205   KETONESUR 20 (A) 12/13/2019 0205   PROTEINUR 30 (A) 12/13/2019 0205   UROBILINOGEN 0.2 01/13/2009 1011   NITRITE NEGATIVE 12/13/2019 0205   LEUKOCYTESUR NEGATIVE 12/13/2019 0205   Sepsis Labs: @LABRCNTIP (procalcitonin:4,lacticidven:4) )No results found for this or any previous visit (from the past 240 hour(s)).  Radiological Exams on Admission: CT ABDOMEN PELVIS W CONTRAST  Result Date: 12/13/2019 CLINICAL DATA:  54 year old male with distended abdomen, abnormal lactic acid. EXAM: CT ABDOMEN AND PELVIS WITH CONTRAST TECHNIQUE: Multidetector CT imaging of the abdomen and pelvis was performed using the standard protocol following bolus administration of intravenous contrast. CONTRAST:  145mL OMNIPAQUE IOHEXOL 300 MG/ML  SOLN COMPARISON:  CT Abdomen and Pelvis 11/05/2018 and earlier. FINDINGS: Lower chest: Bilateral lower lobe pulmonary opacity most resembles atelectasis. No pericardial or pleural effusion. Hepatobiliary: Progressed hepatomegaly since last year (liver length now 24 cm) with new diffusely heterogeneous and abnormal liver enhancement. Geographic areas of hyperenhancement superimposed on chronic hepatic steatosis. The hepatic veins and portal venous system remain patent. No discrete or masslike liver lesion. The gallbladder is distended up to 6 cm diameter but there is no definite pericholecystic inflammation. Pancreas: Within normal limits. Spleen: Remains within normal limits, no splenomegaly. Adrenals/Urinary Tract: Normal adrenal glands. Bilateral renal  enhancement and contrast excretion symmetric and within normal limits. Proximal ureters appear decompressed. No nephrolithiasis. Diminutive and unremarkable urinary bladder. Stomach/Bowel: Decompressed distal large bowel. There is a large bowel anastomosis in the left abdomen which appears similar to the CT last year. Similar gas and retained stool. The right colon appears decompressed although with mild generalized wall thickening. The terminal ileum has a more normal appearance. No dilated small bowel. Decompressed proximal stomach but at the distal gastric body there is circumferential gastric wall thickening with mucosal hyperenhancement (coronal image 32 and series 2, image 38). Fluid in the distal stomach. No free air. Small volume of free fluid throughout the abdomen with simple fluid density. The duodenum and proximal small bowel are within normal limits. Vascular/Lymphatic: Aortoiliac calcified atherosclerosis. Major arterial structures in the abdomen and pelvis are patent. Portal venous system remains patent. No lymphadenopathy. Reproductive: Negative. Other: Moderate to large volume of pelvic free fluid with simple fluid density. Musculoskeletal: No acute osseous abnormality identified. IMPRESSION: 1. Hepatomegaly with new highly heterogeneous liver enhancement superimposed on chronic hepatic steatosis. No hepatic vascular occlusion identified.  No discrete liver lesion. Superimposed new moderate volume ascites. Favor acute or acute on chronic Hepatitis. 2. Superimposed distended gallbladder, but no CT evidence of acute cholecystitis. 3. New circumferential wall thickening of the gastric body suggesting Acute Gastritis. Alternatively this might be reactive due to #1. 4. Mild generalized right colon wall thickening, favor reactive due to #1. No evidence of bowel obstruction. 5. Bilateral lung base opacity most resembles atelectasis. 6. Aortic Atherosclerosis (ICD10-I70.0). Electronically Signed   By: Genevie Ann M.D.   On: 12/13/2019 02:43     Assessment/Plan Principal Problem:   Abdominal pain Active Problems:   Intractable nausea and vomiting   Alcohol abuse   Protein-calorie malnutrition, severe   Ascites    1. Abdominal pain with CAT scan finding concerning for new heterogeneous enhancement of the liver with new ascites and also showing gastritis differentials include possible SBP versus gastritis for which patient is on empiric antibiotics ceftriaxone will order ultrasound-guided paracentesis patient will also be placed on IV Protonix.  Since patient is complaining that he has had vomiting with mild blood tinge may consult GI for possible EGD.  We will keep patient n.p.o. in anticipation of EGD. 2. Alcoholic ketoacidosis likely from vomiting and poor oral intake.  Will need nutrition consult when patient can take orally. 3. Polysubstance abuse including alcohol and marijuana.  Patient is placed on CIWA protocol in consult. 4. Elevated LFTs likely from alcoholic hepatitis.  Since there is new changes in the CT findings for increased heterogeneous changes in the liver may need improved from GI.  Check hepatitis panel with next blood draw. 5. History of dilated cardiomyopathy presently appears dehydrated.  Last EF was measured last year and was 30 to 35%.  Patient states he does not take any medications.  Closely observe. 6. Anemia appears to be chronic mildly worsening.  Follow CBC closely. 7. Hypokalemia likely from vomiting.  Replace recheck.  Check magnesium levels. 8. History of colon cancer status post colectomy.  Per chart.   The patient has abdominal pain with ascites concerning for SBP will need close monitoring for any further worsening in inpatient status.  Covid test is pending.   DVT prophylaxis: SCDs.  Avoiding anticoagulation for procedure and possible GI bleed. Code Status: Full code. Family Communication: Discussed with patient. Disposition Plan: To be  determined. Consults called: None. Admission status: Inpatient.   [name] MD [facility] Pager [phone].  If 7PM-7AM, please contact night-coverage [email or web address] Password [facility]  12/13/2019, 5:14 AM

## 2019-12-13 NOTE — Anesthesia Preprocedure Evaluation (Addendum)
Anesthesia Evaluation  Patient identified by MRN, date of birth, ID band Patient awake    Reviewed: Allergy & Precautions, NPO status , Patient's Chart, lab work & pertinent test results  History of Anesthesia Complications (+) PONV  Airway Mallampati: II  TM Distance: >3 FB     Dental   Pulmonary pneumonia, Current Smoker and Patient abstained from smoking.,    breath sounds clear to auscultation       Cardiovascular hypertension,  Rhythm:Regular Rate:Normal     Neuro/Psych    GI/Hepatic Neg liver ROS, PUD, GERD  ,  Endo/Other    Renal/GU negative Renal ROS     Musculoskeletal   Abdominal   Peds  Hematology   Anesthesia Other Findings   Reproductive/Obstetrics                            Anesthesia Physical Anesthesia Plan  ASA: III  Anesthesia Plan: MAC   Post-op Pain Management:    Induction: Intravenous  PONV Risk Score and Plan: Ondansetron, Propofol infusion and Midazolam  Airway Management Planned: Simple Face Mask and Nasal Cannula  Additional Equipment:   Intra-op Plan:   Post-operative Plan:   Informed Consent: I have reviewed the patients History and Physical, chart, labs and discussed the procedure including the risks, benefits and alternatives for the proposed anesthesia with the patient or authorized representative who has indicated his/her understanding and acceptance.     Dental advisory given  Plan Discussed with: Anesthesiologist and CRNA  Anesthesia Plan Comments:        Anesthesia Quick Evaluation

## 2019-12-13 NOTE — ED Notes (Signed)
ED TO INPATIENT HANDOFF REPORT  Name/Age/Gender Richard [name] 54 y.o. male  Code Status    Code Status Orders  (From admission, onward)         Start     Ordered   12/13/19 0511  Full code  Continuous     12/13/19 0513        Code Status History    Date Active Date Inactive Code Status Order ID Comments User Context   11/05/2018 0830 11/10/2018 1827 Full Code [id number]  Richard Grieve, MD ED   08/14/2018 0752 08/15/2018 1922 Full Code [id number]  Richard Coss, MD ED   06/27/2018 2249 06/28/2018 2151 Full Code [id number]  Richard Patience, MD ED   05/01/2018 2359 05/04/2018 1955 Full Code [id number]  Tomma Rakers, MD ED   11/13/2017 0228 11/15/2017 1102 Full Code [id number]  Etta Quill, DO ED   08/21/2016 0200 08/27/2016 1613 Full Code [id number]  Richard Baker, MD Inpatient   08/20/2016 2323 08/21/2016 0200 Full Code [id number]  Richard [name] ED   Advance Care Planning Activity      Home/SNF/Other Home  Chief Complaint Abdominal pain [R10.9]  Level of Care/Admitting Diagnosis ED Disposition    ED Disposition Condition [facility] Area: [facility] [[id number]]  Level of Care: Telemetry [5]  Admit to tele based on following criteria: Monitor for Ischemic changes  Covid Evaluation: Asymptomatic Screening Protocol (No Symptoms)  Diagnosis: Abdominal pain [939030]  Admitting Physician: Richard [name] [phone]  Attending Physician: Richard [name] [phone]  Estimated length of stay: past midnight tomorrow  Certification:: I certify this patient will need inpatient services for at least 2 midnights       Medical History Past Medical History:  Diagnosis Date  . Acid reflux   . Colon cancer ([place])   . ETOH abuse   . HTN (hypertension)   . Pancreatitis 08/2018  . PUD (peptic ulcer disease)     Allergies Allergies  Allergen Reactions  . Aspirin Other (See Comments)    Acid reflux   . Penicillins Hives     Has patient had a PCN reaction causing immediate rash, facial/tongue/throat swelling, SOB or lightheadedness with hypotension: yes Has patient had a PCN reaction causing severe rash involving mucus membranes or skin necrosis: no Has patient had a PCN reaction that required hospitalization: no Has patient had a PCN reaction occurring within the last 10 years: no If all of the above answers are "NO", then may proceed with Cephalosporin use.     IV Location/Drains/Wounds Patient Lines/Drains/Airways Status   Active Line/Drains/Airways    Name:   Placement date:   Placement time:   Site:   Days:   Peripheral IV 12/13/19 Left;Upper Arm   12/13/19    0001    Arm   less than 1   External Urinary Catheter   11/07/18    1900    --   1          Labs/Imaging Results for orders placed or performed during the hospital encounter of 12/12/19 (from the past 48 hour(s))  Ethanol     Status: Abnormal   Collection Time: 12/12/19 11:33 PM  Result Value Ref Range   Alcohol, Ethyl (B) 77 (H) <10 mg/dL    Comment: (NOTE) Lowest detectable limit for serum alcohol is 10 mg/dL. For medical purposes only. Performed at [facility], [place] [address], [place], [place] [address]   Beta-hydroxybutyric acid  Status: Abnormal   Collection Time: 12/12/19 11:33 PM  Result Value Ref Range   Beta-Hydroxybutyric Acid 3.32 (H) 0.05 - 0.27 mmol/L    Comment: Performed at [facility], [place] [address], [place], [place] [address]  CBC with Differential/Platelet     Status: Abnormal   Collection Time: 12/13/19 12:01 AM  Result Value Ref Range   WBC 17.5 (H) 4.0 - 10.5 K/uL   RBC 2.92 (L) 4.22 - 5.81 MIL/uL   Hemoglobin 9.2 (L) 13.0 - 17.0 g/dL   HCT 26.9 (L) 39.0 - 52.0 %   MCV 92.1 80.0 - 100.0 fL   MCH 31.5 26.0 - 34.0 pg   MCHC 34.2 30.0 - 36.0 g/dL   RDW 16.5 (H) 11.5 - 15.5 %   Platelets 205 150 - 400 K/uL   nRBC 0.0 0.0 - 0.2 %   Neutrophils Relative % 80 %    Neutro Abs 14.1 (H) 1.7 - 7.7 K/uL   Lymphocytes Relative 9 %   Lymphs Abs 1.5 0.7 - 4.0 K/uL   Monocytes Relative 6 %   Monocytes Absolute 1.1 (H) 0.1 - 1.0 K/uL   Eosinophils Relative 3 %   Eosinophils Absolute 0.5 0.0 - 0.5 K/uL   Basophils Relative 1 %   Basophils Absolute 0.1 0.0 - 0.1 K/uL   WBC Morphology TOXIC GRANULATION     Comment: VACUOLATED NEUTROPHILS   Immature Granulocytes 1 %   Abs Immature Granulocytes 0.13 (H) 0.00 - 0.07 K/uL   Polychromasia PRESENT    Target Cells PRESENT     Comment: Performed at [facility], [place] [address], [place], [place] [address]  Basic metabolic panel     Status: Abnormal   Collection Time: 12/13/19 12:01 AM  Result Value Ref Range   Sodium 133 (L) 135 - 145 mmol/L   Potassium 3.0 (L) 3.5 - 5.1 mmol/L   Chloride 93 (L) 98 - 111 mmol/L   CO2 24 22 - 32 mmol/L   Glucose, Bld 79 70 - 99 mg/dL    Comment: Glucose reference range applies only to samples taken after fasting for at least 8 hours.   BUN <5 (L) 6 - 20 mg/dL   Creatinine, Ser 0.49 (L) 0.61 - 1.24 mg/dL   Calcium 8.3 (L) 8.9 - 10.3 mg/dL   GFR calc non Af Amer >60 >60 mL/min   GFR calc Af Amer >60 >60 mL/min   Anion gap 16 (H) 5 - 15    Comment: Performed at [facility], [place] [address], [place], [place] [address]  Hepatic function panel     Status: Abnormal   Collection Time: 12/13/19 12:01 AM  Result Value Ref Range   Total Protein 6.9 6.5 - 8.1 g/dL   Albumin 2.5 (L) 3.5 - 5.0 g/dL   AST 64 (H) 15 - 41 U/L   ALT 23 0 - 44 U/L   Alkaline Phosphatase 205 (H) 38 - 126 U/L   Total Bilirubin 4.6 (H) 0.3 - 1.2 mg/dL   Bilirubin, Direct 2.5 (H) 0.0 - 0.2 mg/dL   Indirect Bilirubin 2.1 (H) 0.3 - 0.9 mg/dL    Comment: Performed at [facility], [place] [address], [place], [place] [address]  Troponin I (High Sensitivity)     Status: None   Collection Time: 12/13/19 12:01 AM  Result Value Ref Range   Troponin I (High  Sensitivity) 5 <18 ng/L    Comment: (NOTE) Elevated high sensitivity troponin I (  hsTnI) values and significant  changes across serial measurements may suggest ACS but many other  chronic and acute conditions are known to elevate hsTnI results.  Refer to the Links section for chest pain algorithms and additional  guidance. Performed at [facility], [place] [address] [place], [place] [address]   Lactic acid, plasma     Status: Abnormal   Collection Time: 12/13/19 12:01 AM  Result Value Ref Range   Lactic Acid, Venous 2.1 (HH) 0.5 - 1.9 mmol/L    Comment: CRITICAL RESULT CALLED TO, READ BACK BY AND VERIFIED WITH: Marina Gravel @ [phone] ON 12/13/19 C VARNER Performed at [facility], [place] [address] [place], [place] [address]   Lipase, blood     Status: None   Collection Time: 12/13/19 12:01 AM  Result Value Ref Range   Lipase 32 11 - 51 U/L    Comment: Performed at [facility], [place] [address], [place], [place] [address]  Protime-INR     Status: Abnormal   Collection Time: 12/13/19 12:01 AM  Result Value Ref Range   Prothrombin Time 17.6 (H) 11.4 - 15.2 seconds   INR 1.5 (H) 0.8 - 1.2    Comment: (NOTE) INR goal varies based on device and disease states. Performed at [facility], [place] [address] [place], [place] [address]   Urinalysis, Routine w reflex microscopic     Status: Abnormal   Collection Time: 12/13/19  2:05 AM  Result Value Ref Range   Color, Urine AMBER (A) YELLOW    Comment: BIOCHEMICALS MAY BE AFFECTED BY COLOR   APPearance CLEAR CLEAR   Specific Gravity, Urine 1.020 1.005 - 1.030   pH 6.0 5.0 - 8.0   Glucose, UA NEGATIVE NEGATIVE mg/dL   Hgb urine dipstick NEGATIVE NEGATIVE   Bilirubin Urine MODERATE (A) NEGATIVE   Ketones, ur 20 (A) NEGATIVE mg/dL   Protein, ur 30 (A) NEGATIVE mg/dL   Nitrite NEGATIVE NEGATIVE   Leukocytes,Ua NEGATIVE NEGATIVE   RBC / HPF 0-5 0 - 5 RBC/hpf   WBC,  UA 0-5 0 - 5 WBC/hpf   Bacteria, UA RARE (A) NONE SEEN   Squamous Epithelial / LPF 0-5 0 - 5   Mucus PRESENT    Hyaline Casts, UA PRESENT     Comment: Performed at [facility], [place] [address] [place], [place] [address]  Rapid urine drug screen (hospital performed)     Status: Abnormal   Collection Time: 12/13/19  2:05 AM  Result Value Ref Range   Opiates POSITIVE (A) NONE DETECTED   Cocaine NONE DETECTED NONE DETECTED   Benzodiazepines NONE DETECTED NONE DETECTED   Amphetamines NONE DETECTED NONE DETECTED   Tetrahydrocannabinol POSITIVE (A) NONE DETECTED   Barbiturates NONE DETECTED NONE DETECTED    Comment: (NOTE) DRUG SCREEN FOR MEDICAL PURPOSES ONLY.  IF CONFIRMATION IS NEEDED FOR ANY PURPOSE, NOTIFY LAB WITHIN 5 DAYS. LOWEST DETECTABLE LIMITS FOR URINE DRUG SCREEN Drug Class                     Cutoff (ng/mL) Amphetamine and metabolites    1000 Barbiturate and metabolites    200 Benzodiazepine                 569 Tricyclics and metabolites     300 Opiates and metabolites        300 Cocaine and metabolites        300 THC  50 Performed at [facility], [place] [address] [place], [place] [address]   Troponin I (High Sensitivity)     Status: None   Collection Time: 12/13/19  2:05 AM  Result Value Ref Range   Troponin I (High Sensitivity) 6 <18 ng/L    Comment: (NOTE) Elevated high sensitivity troponin I (hsTnI) values and significant  changes across serial measurements may suggest ACS but many other  chronic and acute conditions are known to elevate hsTnI results.  Refer to the "Links" section for chest pain algorithms and additional  guidance. Performed at [facility], [place] [name] [place], [place] [address]   SARS CORONAVIRUS 2 (TAT 6-24 HRS) Nasopharyngeal Nasopharyngeal Swab     Status: None   Collection Time: 12/13/19  4:00 AM   Specimen: Nasopharyngeal Swab  Result Value Ref  Range   SARS Coronavirus 2 NEGATIVE NEGATIVE    Comment: (NOTE) SARS-CoV-2 target nucleic acids are NOT DETECTED. The SARS-CoV-2 RNA is generally detectable in upper and lower respiratory specimens during the acute phase of infection. Negative results do not preclude SARS-CoV-2 infection, do not rule out co-infections with other pathogens, and should not be used as the sole basis for treatment or other patient management decisions. Negative results must be combined with clinical observations, patient history, and epidemiological information. The expected result is Negative. Fact Sheet for Patients: SugarRoll.be Fact Sheet for Healthcare Providers: https://www.woods-mathews.com/ This test is not yet approved or cleared by the [place] FDA and  has been authorized for detection and/or diagnosis of SARS-CoV-2 by FDA under an Emergency Use Authorization (EUA). This EUA will remain  in effect (meaning this test can be used) for the duration of the COVID-19 declaration under Section 56 4(b)(1) of the Act, 21 U.S.C. section 360bbb-3(b)(1), unless the authorization is terminated or revoked sooner. Performed at [facility], [place] [address] [place], [place] [address]   HIV Antibody (routine testing w rflx)     Status: None   Collection Time: 12/13/19  5:09 AM  Result Value Ref Range   HIV Screen 4th Generation wRfx NON REACTIVE NON REACTIVE    Comment: Performed at [place] [address] [place], [place] [address]  Basic metabolic panel     Status: Abnormal   Collection Time: 12/13/19  5:09 AM  Result Value Ref Range   Sodium 132 (L) 135 - 145 mmol/L   Potassium 3.0 (L) 3.5 - 5.1 mmol/L   Chloride 91 (L) 98 - 111 mmol/L   CO2 26 22 - 32 mmol/L   Glucose, Bld 77 70 - 99 mg/dL    Comment: Glucose reference range applies only to samples taken after fasting for at least 8 hours.   BUN <5 (L) 6 - 20 mg/dL   Creatinine, Ser  0.43 (L) 0.61 - 1.24 mg/dL   Calcium 8.0 (L) 8.9 - 10.3 mg/dL   GFR calc non Af Amer >60 >60 mL/min   GFR calc Af Amer >60 >60 mL/min   Anion gap 15 5 - 15    Comment: Performed at [facility], [place] [address] [place], [place] [address]  Hepatic function panel     Status: Abnormal   Collection Time: 12/13/19  5:09 AM  Result Value Ref Range   Total Protein 6.1 (L) 6.5 - 8.1 g/dL   Albumin 2.3 (L) 3.5 - 5.0 g/dL   AST 65 (H) 15 - 41 U/L   ALT 19 0 - 44  U/L   Alkaline Phosphatase 191 (H) 38 - 126 U/L   Total Bilirubin 4.2 (H) 0.3 - 1.2 mg/dL   Bilirubin, Direct 2.1 (H) 0.0 - 0.2 mg/dL   Indirect Bilirubin 2.1 (H) 0.3 - 0.9 mg/dL    Comment: Performed at [facility], [place] [address] [place], [place] [address]  Magnesium     Status: Abnormal   Collection Time: 12/13/19  5:09 AM  Result Value Ref Range   Magnesium 1.4 (L) 1.7 - 2.4 mg/dL    Comment: Performed at [facility], [place] [name] [place], [place] [address]  CBC     Status: Abnormal   Collection Time: 12/13/19  5:09 AM  Result Value Ref Range   WBC 15.1 (H) 4.0 - 10.5 K/uL   RBC 2.94 (L) 4.22 - 5.81 MIL/uL   Hemoglobin 9.4 (L) 13.0 - 17.0 g/dL   HCT 27.2 (L) 39.0 - 52.0 %   MCV 92.5 80.0 - 100.0 fL   MCH 32.0 26.0 - 34.0 pg   MCHC 34.6 30.0 - 36.0 g/dL   RDW 16.3 (H) 11.5 - 15.5 %   Platelets 181 150 - 400 K/uL   nRBC 0.0 0.0 - 0.2 %    Comment: Performed at [facility], [place] [address], [place], [place] [address]  ABO/Rh     Status: None   Collection Time: 12/13/19  6:43 AM  Result Value Ref Range   ABO/RH(D)      B POS Performed at [facility], [place] [address], [place], [place] [address]   Type and screen [place]     Status: None   Collection Time: 12/13/19  6:44 AM  Result Value Ref Range   ABO/RH(D) B POS    Antibody Screen NEG    Sample Expiration      12/16/2019,2359 Performed at [facility], [place] [address] [place],  [address]   CBG monitoring, ED     Status: None   Collection Time: 12/13/19 12:06 PM  Result Value Ref Range   Glucose-Capillary 75 70 - 99 mg/dL    Comment: Glucose reference range applies only to samples taken after fasting for at least 8 hours.   CT ABDOMEN PELVIS W CONTRAST  Result Date: 12/13/2019 CLINICAL DATA:  54 year old male with distended abdomen, abnormal lactic acid. EXAM: CT ABDOMEN AND PELVIS WITH CONTRAST TECHNIQUE: Multidetector CT imaging of the abdomen and pelvis was performed using the standard protocol following bolus administration of intravenous contrast. CONTRAST:  131m OMNIPAQUE IOHEXOL 300 MG/ML  SOLN COMPARISON:  CT Abdomen and Pelvis 11/05/2018 and earlier. FINDINGS: Lower chest: Bilateral lower lobe pulmonary opacity most resembles atelectasis. No pericardial or pleural effusion. Hepatobiliary: Progressed hepatomegaly since last year (liver length now 24 cm) with new diffusely heterogeneous and abnormal liver enhancement. Geographic areas of hyperenhancement superimposed on chronic hepatic steatosis. The hepatic veins and portal venous system remain patent. No discrete or masslike liver lesion. The gallbladder is distended up to 6 cm diameter but there is no definite pericholecystic inflammation. Pancreas: Within normal limits. Spleen: Remains within normal limits, no splenomegaly. Adrenals/Urinary Tract: Normal adrenal glands. Bilateral renal enhancement and contrast excretion symmetric and within normal limits. Proximal ureters appear decompressed. No nephrolithiasis. Diminutive and unremarkable urinary bladder. Stomach/Bowel: Decompressed distal large bowel. There is a large bowel anastomosis in the left abdomen which appears similar to the CT last year. Similar gas and retained stool. The right colon appears decompressed  although with mild generalized wall thickening. The terminal ileum has a more normal  appearance. No dilated small bowel. Decompressed proximal stomach but at the distal gastric body there is circumferential gastric wall thickening with mucosal hyperenhancement (coronal image 32 and series 2, image 38). Fluid in the distal stomach. No free air. Small volume of free fluid throughout the abdomen with simple fluid density. The duodenum and proximal small bowel are within normal limits. Vascular/Lymphatic: Aortoiliac calcified atherosclerosis. Major arterial structures in the abdomen and pelvis are patent. Portal venous system remains patent. No lymphadenopathy. Reproductive: Negative. Other: Moderate to large volume of pelvic free fluid with simple fluid density. Musculoskeletal: No acute osseous abnormality identified. IMPRESSION: 1. Hepatomegaly with new highly heterogeneous liver enhancement superimposed on chronic hepatic steatosis. No hepatic vascular occlusion identified.  No discrete liver lesion. Superimposed new moderate volume ascites. Favor acute or acute on chronic Hepatitis. 2. Superimposed distended gallbladder, but no CT evidence of acute cholecystitis. 3. New circumferential wall thickening of the gastric body suggesting Acute Gastritis. Alternatively this might be reactive due to #1. 4. Mild generalized right colon wall thickening, favor reactive due to #1. No evidence of bowel obstruction. 5. Bilateral lung base opacity most resembles atelectasis. 6. Aortic Atherosclerosis (ICD10-I70.0). Electronically Signed   By: Genevie Ann M.D.   On: 12/13/2019 02:43   [place] ASCITES (ABDOMEN LIMITED)  Result Date: 12/13/2019 CLINICAL DATA:  Liver disease and ascites. EXAM: LIMITED ABDOMEN ULTRASOUND FOR ASCITES TECHNIQUE: Limited ultrasound survey for ascites was performed in all four abdominal quadrants. COMPARISON:  CT of the abdomen and pelvis earlier today. FINDINGS: By ultrasound, there is only a small amount of accessible ascites in the peritoneal cavity and not enough to allow for safe  paracentesis. By CT, majority of fluid is in the deep dependent pelvis which is not accessible to percutaneous drainage. IMPRESSION: Small amount of accessible ascites in the peritoneal cavity. There was not enough fluid present to allow for safe paracentesis today. Electronically Signed   By: Aletta Edouard M.D.   On: 12/13/2019 11:34    Pending Labs Unresulted Labs (From admission, onward)    Start     Ordered   12/14/19 1700  CBC  Now then every 12 hours,   R    Comments: Page MD with results.    12/13/19 0955   12/14/19 0500  Comprehensive metabolic panel  Tomorrow morning,   R     12/13/19 0955   12/13/19 8938  Basic metabolic panel  Once-Timed,   STAT    Comments: Page MD with results.    12/13/19 0955   12/13/19 1700  Magnesium  Once-Timed,   STAT    Comments: Page MD with results.    12/13/19 0955   12/13/19 0957  Hepatitis panel, acute  Add-on,   AD     12/13/19 0956   12/12/19 2333  Ammonia  ONCE - STAT,   STAT     12/12/19 2333   Unscheduled  Occult blood card to lab, stool RN will collect  As needed,   R    Question:  Specimen to be collected by:  Answer:  RN will collect   12/13/19 0620          Vitals/Pain Today's Vitals   12/13/19 1050 12/13/19 1118 12/13/19 1205 12/13/19 1300  BP:  104/66 101/66 121/84  Pulse:   80 90  Resp:   15 20  Temp:      TempSrc:      SpO2:  97% 100%  Weight:      Height:      PainSc: 5        Isolation Precautions No active isolations  Medications Medications  sodium chloride (PF) 0.9 % injection (has no administration in time range)  LORazepam (ATIVAN) tablet 1-4 mg (has no administration in time range)    Or  LORazepam (ATIVAN) injection 1-4 mg (has no administration in time range)  thiamine tablet 100 mg ( Oral See Alternative 12/13/19 0948)    Or  thiamine (B-1) injection 100 mg (100 mg Intravenous Given 3/54/56 2563)  folic acid (FOLVITE) tablet 1 mg (1 mg Oral Given 12/13/19 0955)  multivitamin with minerals  tablet 1 tablet (1 tablet Oral Given 12/13/19 0955)  acetaminophen (TYLENOL) tablet 650 mg (has no administration in time range)    Or  acetaminophen (TYLENOL) suppository 650 mg (has no administration in time range)  ondansetron (ZOFRAN) tablet 4 mg (has no administration in time range)    Or  ondansetron (ZOFRAN) injection 4 mg (has no administration in time range)  pantoprazole (PROTONIX) injection 40 mg (40 mg Intravenous Given 12/13/19 0946)  cefTRIAXone (ROCEPHIN) 2 g in sodium chloride 0.9 % 100 mL IVPB (has no administration in time range)  potassium chloride 10 mEq in 100 mL IVPB (10 mEq Intravenous New Bag/Given 12/13/19 1429)  morphine 2 MG/ML injection 2 mg (2 mg Intravenous Given 12/13/19 0944)  nicotine (NICODERM CQ - dosed in mg/24 hours) patch 21 mg (21 mg Transdermal Patch Applied 12/13/19 0950)  LORazepam (ATIVAN) injection 0-4 mg (0 mg Intravenous Not Given 12/13/19 0953)    Followed by  LORazepam (ATIVAN) injection 0-4 mg (has no administration in time range)  0.9 % NaCl with KCl 40 mEq / L  infusion (75 mL/hr Intravenous New Bag/Given 12/13/19 1049)  polyethylene glycol powder (GLYCOLAX/MIRALAX) container 127.5 g (has no administration in time range)  peg 3350 powder (MOVIPREP) kit 100 g (has no administration in time range)    And  peg 3350 powder (MOVIPREP) kit 100 g (has no administration in time range)  morphine 4 MG/ML injection 4 mg (4 mg Intravenous Given 12/13/19 0001)  ondansetron (ZOFRAN) injection 4 mg (4 mg Intravenous Given 12/13/19 0001)  iohexol (OMNIPAQUE) 300 MG/ML solution 100 mL (100 mLs Intravenous Contrast Given 12/13/19 0145)  cefTRIAXone (ROCEPHIN) 2 g in sodium chloride 0.9 % 100 mL IVPB (0 g Intravenous Stopped 12/13/19 0424)  magnesium sulfate IVPB 4 g 100 mL (0 g Intravenous Stopped 12/13/19 1207)    Mobility walks

## 2019-12-13 NOTE — Progress Notes (Signed)
PROGRESS NOTE   Richard [name]  [id number]    DOB: [birth year]    DOA: 12/12/2019  PCP: Antony Blackbird, MD   I have briefly reviewed patients previous medical records in [facility].  Chief Complaint:   Chief Complaint  Patient presents with  . Abdominal Pain  . Nausea  . Emesis    Brief Narrative:  54 year old male, lives with his brother, independent, PMH of alcohol and tobacco dependence, colon cancer s/p colectomy and in remission, pancreatitis, dilated cardiomyopathy with last LVEF 30-35% last year, GERD/PUD, HTN, presented to [facility] ED on 12/12/2019 due to diffuse abdominal pain, 3-4 episodes of vomiting every day with some blood in the emesis but no coffee-ground material. Admitted for abdominal pain, abnormal CT with liver abnormality, new ascites R/O SBP, high risk for alcohol withdrawal. [facility] consulted.   Assessment & Plan:  Principal Problem:   Abdominal pain Active Problems:   Intractable nausea and vomiting   Alcohol abuse   Protein-calorie malnutrition, severe   Ascites   Abdominal pain with nausea and vomiting: With minimal blood in emesis but no coffee-ground or melena. Wide DD: PUD/gastritis/esophagitis/acute alcoholic hepatitis (although LFTs argue against), pancreatitis (although normal lipase argues against), SBP given new ascites versus others. Ultrasound-guided paracentesis by IR requested. Continue empirically started IV ceftriaxone. Continue NPO, IV fluids and IV PPI. [facility] consulted.  Dehydration with hyponatremia: Secondary to poor oral intake and GI losses. IV fluids.  Hypokalemia: Replace aggressively IV and follow BMP.  Hypomagnesemia: Magnesium 1.4. Replace IV aggressively and follow.  Anemia: May be related to chronic kidney disease. Stable. Follow daily CBC.  Leukocytosis: Could be related to possible SBP versus stress response. Neutrophilic and toxic granulations noted. Follow daily CBCs.  Acute on chronic  hepatitis: Minimal transaminitis. Follow hepatitis panel. Alcohol abstinence counseled. Follow daily LFTs. CT abdomen results as below: Hepatomegaly with new highly heterogeneous liver enhancement superimposed on chronic hepatic steatosis.  Alcohol dependence: Reportedly consumes 1.5 pint of vodka daily, last drink 2-3 days PTA. At high risk for alcohol withdrawal and associated complications. CIWA protocol with scheduled and as needed Ativan.  Tobacco abuse: Cessation counseled. Nicotine patch started per his request.  Hypertension: Currently controlled off of medications.  Cardiomyopathy: LVEF 30-35% by TTE 11/08/2018. Currently clinically dry. Monitor while on IV fluids.  Body mass index is 16.27 kg/m.   THC abuse: Cessation counseled.  History of falls: Reports that he has fallen a couple times at home. Unclear if this is in his inebriated state. Consider PT evaluation when able.   DVT prophylaxis: SCDs Code Status: Full Family Communication: None at bedside Disposition:  . Patient came from: Home           . Anticipated d/c place: Home . Barriers to d/c: Ongoing acute abdominal pain, recent nausea vomiting, suspected mild upper GI bleed, multiple electrolyte abnormality   Consultants:   [facility]  Procedures:   None  Antimicrobials:   None   Subjective:  Reviewed and examined along with RN in room. Ongoing diffuse abdominal pain, slightly better compared to admission. Mild abdominal distention. No nausea and vomiting since admission. Had a BM yesterday with normal colored stools.  Objective:   Vitals:   12/13/19 0600 12/13/19 0615 12/13/19 0630 12/13/19 0645  BP: 100/62 106/65 104/65 105/73  Pulse: 89 87 86 87  Resp: 20 (!) 21 19 16   Temp:      TempSrc:      SpO2: 95% 95% 97% 96%  Weight:      Height:        General exam: Pleasant middle-age male, moderately built thinly nourished lying comfortably propped up in bed without distress. Oral mucosa  dry. Respiratory system: Clear to auscultation. Respiratory effort normal. Cardiovascular system: S1 & S2 heard, RRR. No JVD, murmurs, rubs, gallops or clicks. No pedal edema. Telemetry personally reviewed: Sinus rhythm. Gastrointestinal system: Abdomen is minimally distended, diffuse mild tenderness without rigidity, guarding or rebound. No organomegaly or masses appreciated. Ascites +. Midline laparotomy scar. Central nervous system: Alert and oriented. No focal neurological deficits. Extremities: Symmetric 5 x 5 power. Skin: No rashes, lesions or ulcers Psychiatry: Judgement and insight appear normal. Mood & affect appropriate.  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    Data Reviewed:   I have personally reviewed following labs and imaging studies   CBC: Recent Labs  Lab 12/13/19 0001 12/13/19 0509  WBC 17.5* 15.1*  NEUTROABS 14.1*  --   HGB 9.2* 9.4*  HCT 26.9* 27.2*  MCV 92.1 92.5  PLT 205 0000000    Basic Metabolic Panel: Recent Labs  Lab 12/13/19 0001 12/13/19 0509  NA 133* 132*  K 3.0* 3.0*  CL 93* 91*  CO2 24 26  GLUCOSE 79 77  BUN <5* <5*  CREATININE 0.49* 0.43*  CALCIUM 8.3* 8.0*  MG  --  1.4*    Liver Function Tests: Recent Labs  Lab 12/13/19 0001 12/13/19 0509  AST 64* 65*  ALT 23 19  ALKPHOS 205* 191*  BILITOT 4.6* 4.2*  PROT 6.9 6.1*  ALBUMIN 2.5* 2.3*    CBG: No results for input(s): GLUCAP in the last 168 hours.  Microbiology Studies:  No results found for this or any previous visit (from the past 240 hour(s)).   Radiology Studies:  CT ABDOMEN PELVIS W CONTRAST  Result Date: 12/13/2019 CLINICAL DATA:  54 year old male with distended abdomen, abnormal lactic acid. EXAM: CT ABDOMEN AND PELVIS WITH CONTRAST TECHNIQUE: Multidetector CT imaging of the abdomen and pelvis was performed using the standard protocol following bolus administration of intravenous contrast. CONTRAST:  168mL OMNIPAQUE IOHEXOL 300 MG/ML  SOLN COMPARISON:  CT Abdomen  and Pelvis 11/05/2018 and earlier. FINDINGS: Lower chest: Bilateral lower lobe pulmonary opacity most resembles atelectasis. No pericardial or pleural effusion. Hepatobiliary: Progressed hepatomegaly since last year (liver length now 24 cm) with new diffusely heterogeneous and abnormal liver enhancement. Geographic areas of hyperenhancement superimposed on chronic hepatic steatosis. The hepatic veins and portal venous system remain patent. No discrete or masslike liver lesion. The gallbladder is distended up to 6 cm diameter but there is no definite pericholecystic inflammation. Pancreas: Within normal limits. Spleen: Remains within normal limits, no splenomegaly. Adrenals/Urinary Tract: Normal adrenal glands. Bilateral renal enhancement and contrast excretion symmetric and within normal limits. Proximal ureters appear decompressed. No nephrolithiasis. Diminutive and unremarkable urinary bladder. Stomach/Bowel: Decompressed distal large bowel. There is a large bowel anastomosis in the left abdomen which appears similar to the CT last year. Similar gas and retained stool. The right colon appears decompressed although with mild generalized wall thickening. The terminal ileum has a more normal appearance. No dilated small bowel. Decompressed proximal stomach but at the distal gastric body there is circumferential gastric wall thickening with mucosal hyperenhancement (coronal image 32 and series 2, image 38). Fluid in the distal stomach. No free air. Small volume of free fluid throughout the abdomen with simple fluid density. The duodenum and proximal small bowel are within normal limits. Vascular/Lymphatic: Aortoiliac calcified atherosclerosis.  Major arterial structures in the abdomen and pelvis are patent. Portal venous system remains patent. No lymphadenopathy. Reproductive: Negative. Other: Moderate to large volume of pelvic free fluid with simple fluid density. Musculoskeletal: No acute osseous abnormality  identified. IMPRESSION: 1. Hepatomegaly with new highly heterogeneous liver enhancement superimposed on chronic hepatic steatosis. No hepatic vascular occlusion identified.  No discrete liver lesion. Superimposed new moderate volume ascites. Favor acute or acute on chronic Hepatitis. 2. Superimposed distended gallbladder, but no CT evidence of acute cholecystitis. 3. New circumferential wall thickening of the gastric body suggesting Acute Gastritis. Alternatively this might be reactive due to #1. 4. Mild generalized right colon wall thickening, favor reactive due to #1. No evidence of bowel obstruction. 5. Bilateral lung base opacity most resembles atelectasis. 6. Aortic Atherosclerosis (ICD10-I70.0). Electronically Signed   By: Genevie Ann M.D.   On: 12/13/2019 02:43     Scheduled Meds:   . folic acid  1 mg Oral Daily  . LORazepam  0-4 mg Intravenous Q6H   Followed by  . [START ON 12/15/2019] LORazepam  0-4 mg Intravenous Q12H  . multivitamin with minerals  1 tablet Oral Daily  . nicotine  21 mg Transdermal Daily  . pantoprazole (PROTONIX) IV  40 mg Intravenous Q12H  . sodium chloride (PF)      . thiamine  100 mg Oral Daily   Or  . thiamine  100 mg Intravenous Daily    Continuous Infusions:   . 0.9 % NaCl with KCl 40 mEq / L    . cefTRIAXone (ROCEPHIN)  IV    . magnesium sulfate bolus IVPB    . potassium chloride 10 mEq (12/13/19 0800)     LOS: 0 days     Vernell Leep, MD, [place], [facility]. [facility]    To contact the attending provider between 7A-7P or the covering provider during after hours 7P-7A, please log into the web site [email or web address] and access using universal [place] password for that web site. If you do not have the password, please call the hospital operator.  12/13/2019, 9:41 AM

## 2019-12-13 NOTE — ED Notes (Signed)
Patient was taken to [place] by [place] staff.

## 2019-12-13 NOTE — Consult Note (Addendum)
Referring Provider:  Dr. Algis Liming, [facility] Primary Care Physician:  Antony Blackbird, MD Primary Gastroenterologist:  Dr. Henrene Pastor in 2007  Reason for Consultation:  Hematemesis  HPI: Richard [name] is a 54 y.o. male PMH of alcohol and tobacco dependence, pancreatitis, dilated cardiomyopathy with last LVEF 30-35% last year, GERD/PUD, HTN, history of colon cancer in 2007 diagnosed by Dr. Henrene Pastor.  Since then he has followed with [facility] and had a colonoscopy in 2015, but according to [facility] office he has been discharged from [facility].  Anyway, he also has medical history of ETOH abuse.    He presented to [facility] with complaints of abdominal pain, nausea, vomiting.  He tells me that he has vomiting regularly almost on a daily basis, sometimes multiple times a day.  Has noted some blood in his vomitus in the form of red blood streaks at times.  Admits to drinking alcohol daily when asked how much he says "too much".  Labs show hemoglobin 9.2 g, platelets 205, creatinine 0.4, potassium 3, albumin 2.5, alk phos 205, total bili 4.2, lipase normal, white blood cell count elevated at 17.5.  CT scan of the abdomen pelvis with contrast showed the following:  IMPRESSION: 1. Hepatomegaly with new highly heterogeneous liver enhancement superimposed on chronic hepatic steatosis. No hepatic vascular occlusion identified.  No discrete liver lesion.  Superimposed new moderate volume ascites. Favor acute or acute on chronic Hepatitis.  2. Superimposed distended gallbladder, but no CT evidence of acute cholecystitis.  3. New circumferential wall thickening of the gastric body suggesting Acute Gastritis. Alternatively this might be reactive due to #1.  4. Mild generalized right colon wall thickening, favor reactive due to #1. No evidence of bowel obstruction.  5. Bilateral lung base opacity most resembles atelectasis.  6. Aortic Atherosclerosis (ICD10-I70.0).   Not enough fluid for  paracentesis.  In light of leukocytosis and ascites he has been started on Rocephin for possible SBP.   Past Medical History:  Diagnosis Date  . Acid reflux   . Colon cancer ([place])   . ETOH abuse   . HTN (hypertension)   . Pancreatitis 08/2018  . PUD (peptic ulcer disease)     Past Surgical History:  Procedure Laterality Date  . LAPAROSCOPIC SIGMOID COLECTOMY  2007    Prior to Admission medications   Medication Sig Start Date End Date Taking? Authorizing Provider  acetaminophen (TYLENOL) 500 MG tablet Take 1,000 mg by mouth every 6 (six) hours as needed for mild pain, moderate pain or headache.   Yes [provider]  folic acid (FOLVITE) 1 MG tablet Take 1 tablet (1 mg total) by mouth daily. Patient not taking: Reported on 11/05/2018 07/20/18   Fulp, Ander Gaster, MD  losartan (COZAAR) 100 MG tablet Take 1 tablet (100 mg total) by mouth daily. Patient not taking: Reported on 12/13/2019 11/11/18   Mosetta Anis, MD  naltrexone (DEPADE) 50 MG tablet Take 1 tablet (50 mg total) by mouth daily. Patient not taking: Reported on 12/13/2019 11/10/18   Mosetta Anis, MD  pantoprazole (PROTONIX) 40 MG tablet Take 1 tablet (40 mg total) by mouth daily. Patient not taking: Reported on 12/13/2019 11/11/18   Mosetta Anis, MD  thiamine 100 MG tablet Take 1 tablet (100 mg total) by mouth daily. Patient not taking: Reported on 11/05/2018 07/20/18   Antony Blackbird, MD    Current Facility-Administered Medications  Medication Dose Route Frequency Provider Last Rate Last Admin  . 0.9 % NaCl  with KCl 40 mEq / L  infusion   Intravenous Continuous Hongalgi, Anand D, MD      . acetaminophen (TYLENOL) tablet 650 mg  650 mg Oral Q6H PRN [name], MD       Or  . acetaminophen (TYLENOL) suppository 650 mg  650 mg Rectal Q6H PRN [name], MD      . cefTRIAXone (ROCEPHIN) 2 g in sodium chloride 0.9 % 100 mL IVPB  2 g Intravenous Q24H [name], MD      . folic acid (FOLVITE) tablet 1  mg  1 mg Oral Daily [name], MD      . LORazepam (ATIVAN) injection 0-4 mg  0-4 mg Intravenous Q6H Hongalgi, Lenis Dickinson, MD       Followed by  . [START ON 12/15/2019] LORazepam (ATIVAN) injection 0-4 mg  0-4 mg Intravenous Q12H Hongalgi, Anand D, MD      . LORazepam (ATIVAN) tablet 1-4 mg  1-4 mg Oral Q1H PRN [name], MD       Or  . LORazepam (ATIVAN) injection 1-4 mg  1-4 mg Intravenous Q1H PRN [name], MD      . magnesium sulfate IVPB 4 g 100 mL  4 g Intravenous Once Hongalgi, Everlene Farrier D, MD      . morphine 2 MG/ML injection 2 mg  2 mg Intravenous Q4H PRN Hongalgi, Lenis Dickinson, MD      . multivitamin with minerals tablet 1 tablet  1 tablet Oral Daily [name], MD      . nicotine (NICODERM CQ - dosed in mg/24 hours) patch 21 mg  21 mg Transdermal Daily Hongalgi, Lenis Dickinson, MD      . ondansetron (ZOFRAN) tablet 4 mg  4 mg Oral Q6H PRN [name], MD       Or  . ondansetron [facility]) injection 4 mg  4 mg Intravenous Q6H PRN [name], MD      . pantoprazole (PROTONIX) injection 40 mg  40 mg Intravenous Q12H Gean Birchwood N, MD      . potassium chloride 10 mEq in 100 mL IVPB  10 mEq Intravenous Q1 Hr x 6 Hongalgi, Anand D, MD 100 mL/hr at 12/13/19 0800 10 mEq at 12/13/19 0800  . sodium chloride (PF) 0.9 % injection           . thiamine tablet 100 mg  100 mg Oral Daily [name], MD       Or  . thiamine (B-1) injection 100 mg  100 mg Intravenous Daily [name], MD       Current Outpatient Medications  Medication Sig Dispense Refill  . acetaminophen (TYLENOL) 500 MG tablet Take 1,000 mg by mouth every 6 (six) hours as needed for mild pain, moderate pain or headache.    . folic acid (FOLVITE) 1 MG tablet Take 1 tablet (1 mg total) by mouth daily. (Patient not taking: Reported on 11/05/2018) 30 tablet 6  . losartan (COZAAR) 100 MG tablet Take 1 tablet (100 mg total) by mouth daily. (Patient not taking: Reported on  12/13/2019) 90 tablet 0  . naltrexone (DEPADE) 50 MG tablet Take 1 tablet (50 mg total) by mouth daily. (Patient not taking: Reported on 12/13/2019) 90 tablet 0  . pantoprazole (PROTONIX) 40 MG tablet Take 1 tablet (40 mg total) by mouth daily. (Patient not taking: Reported on 12/13/2019) 90 tablet 0  . thiamine 100 MG tablet Take 1  tablet (100 mg total) by mouth daily. (Patient not taking: Reported on 11/05/2018) 30 tablet 6    Allergies as of 12/12/2019 - Review Complete 12/12/2019  Allergen Reaction Noted  . Aspirin Other (See Comments) 08/20/2016  . Penicillins Hives 01/09/2012    Family History  Problem Relation Age of Onset  . Colon cancer Father   . Cancer Sister   . CAD Neg Hx   . Stroke Neg Hx   . Diabetes Neg Hx     Social History   Socioeconomic History  . Marital status: Single    Spouse name: Not on file  . Number of children: Not on file  . Years of education: Not on file  . Highest education level: Not on file  Occupational History  . Not on file  Tobacco Use  . Smoking status: Current Every Day Smoker    Packs/day: 1.00  . Smokeless tobacco: Never Used  Substance and Sexual Activity  . Alcohol use: Yes    Alcohol/week: 2.0 standard drinks    Types: 1 Cans of beer, 1 Shots of liquor per week    Comment: heavy alcohol abuse a beer and couple of shots a day for past  14 years  . Drug use: No  . Sexual activity: Not Currently  Other Topics Concern  . Not on file  Social History Narrative  . Not on file   Social Determinants of Health   Financial Resource Strain:   . Difficulty of Paying Living Expenses:   Food Insecurity:   . Worried About Charity fundraiser in the Last Year:   . Arboriculturist in the Last Year:   Transportation Needs:   . Film/video editor (Medical):   Marland Kitchen Lack of Transportation (Non-Medical):   Physical Activity:   . Days of Exercise per Week:   . Minutes of Exercise per Session:   Stress:   . Feeling of Stress :   Social  Connections:   . Frequency of Communication with Friends and Family:   . Frequency of Social Gatherings with Friends and Family:   . Attends Religious Services:   . Active Member of Clubs or Organizations:   . Attends Archivist Meetings:   Marland Kitchen Marital Status:   Intimate Partner Violence:   . Fear of Current or Ex-Partner:   . Emotionally Abused:   Marland Kitchen Physically Abused:   . Sexually Abused:     Review of Systems: ROS is O/W negative except as mentioned in HPI.  Physical Exam: Vital signs in last 24 hours: Temp:  [98.3 F (36.8 C)] 98.3 F (36.8 C) (03/11 2321) Pulse Rate:  [85-97] 87 (03/12 0645) Resp:  [12-24] 16 (03/12 0645) BP: (93-113)/(62-76) 105/73 (03/12 0645) SpO2:  [93 %-100 %] 96 % (03/12 0645) Weight:  [54.4 kg] 54.4 kg (03/11 2321)   General:  Alert, chronically ill-appearing, pleasant and cooperative in NAD; appears malnourished. Head:  Normocephalic and atraumatic. Eyes:  Sclera clear, no icterus.  Conjunctiva pink. Ears:  Normal auditory acuity. Mouth:  No deformity or lesions.   Lungs:  Clear throughout to auscultation.  No wheezes, crackles, or rhonchi.  Heart:  Regular rate and rhythm; no murmurs, clicks, rubs, or gallops. Abdomen:  Soft, slightly distended.  BS present.  Non-tender.  Scar noted on lower abdomen from previous surgery.  Msk:  Symmetrical without gross deformities. Pulses:  Normal pulses noted. Extremities:  Without clubbing or edema. Neurologic:  Alert and oriented x 4;  grossly  normal neurologically. Skin:  Intact without significant lesions or rashes. Psych:  Alert and cooperative. Normal mood and affect.  Intake/Output from previous day: 03/11 0701 - 03/12 0700 In: 100 [IV Piggyback:100] Out: -   Lab Results: Recent Labs    12/13/19 0001 12/13/19 0509  WBC 17.5* 15.1*  HGB 9.2* 9.4*  HCT 26.9* 27.2*  PLT 205 181   BMET Recent Labs    12/13/19 0001 12/13/19 0509  NA 133* 132*  K 3.0* 3.0*  CL 93* 91*  CO2 24  26  GLUCOSE 79 77  BUN <5* <5*  CREATININE 0.49* 0.43*  CALCIUM 8.3* 8.0*   LFT Recent Labs    12/13/19 0509  PROT 6.1*  ALBUMIN 2.3*  AST 65*  ALT 19  ALKPHOS 191*  BILITOT 4.2*  BILIDIR 2.1*  IBILI 2.1*   PT/INR Recent Labs    12/13/19 0001  LABPROT 17.6*  INR 1.5*   Studies/Results: CT ABDOMEN PELVIS W CONTRAST  Result Date: 12/13/2019 CLINICAL DATA:  53 year old male with distended abdomen, abnormal lactic acid. EXAM: CT ABDOMEN AND PELVIS WITH CONTRAST TECHNIQUE: Multidetector CT imaging of the abdomen and pelvis was performed using the standard protocol following bolus administration of intravenous contrast. CONTRAST:  125m OMNIPAQUE IOHEXOL 300 MG/ML  SOLN COMPARISON:  CT Abdomen and Pelvis 11/05/2018 and earlier. FINDINGS: Lower chest: Bilateral lower lobe pulmonary opacity most resembles atelectasis. No pericardial or pleural effusion. Hepatobiliary: Progressed hepatomegaly since last year (liver length now 24 cm) with new diffusely heterogeneous and abnormal liver enhancement. Geographic areas of hyperenhancement superimposed on chronic hepatic steatosis. The hepatic veins and portal venous system remain patent. No discrete or masslike liver lesion. The gallbladder is distended up to 6 cm diameter but there is no definite pericholecystic inflammation. Pancreas: Within normal limits. Spleen: Remains within normal limits, no splenomegaly. Adrenals/Urinary Tract: Normal adrenal glands. Bilateral renal enhancement and contrast excretion symmetric and within normal limits. Proximal ureters appear decompressed. No nephrolithiasis. Diminutive and unremarkable urinary bladder. Stomach/Bowel: Decompressed distal large bowel. There is a large bowel anastomosis in the left abdomen which appears similar to the CT last year. Similar gas and retained stool. The right colon appears decompressed although with mild generalized wall thickening. The terminal ileum has a more normal appearance.  No dilated small bowel. Decompressed proximal stomach but at the distal gastric body there is circumferential gastric wall thickening with mucosal hyperenhancement (coronal image 32 and series 2, image 38). Fluid in the distal stomach. No free air. Small volume of free fluid throughout the abdomen with simple fluid density. The duodenum and proximal small bowel are within normal limits. Vascular/Lymphatic: Aortoiliac calcified atherosclerosis. Major arterial structures in the abdomen and pelvis are patent. Portal venous system remains patent. No lymphadenopathy. Reproductive: Negative. Other: Moderate to large volume of pelvic free fluid with simple fluid density. Musculoskeletal: No acute osseous abnormality identified. IMPRESSION: 1. Hepatomegaly with new highly heterogeneous liver enhancement superimposed on chronic hepatic steatosis. No hepatic vascular occlusion identified.  No discrete liver lesion. Superimposed new moderate volume ascites. Favor acute or acute on chronic Hepatitis. 2. Superimposed distended gallbladder, but no CT evidence of acute cholecystitis. 3. New circumferential wall thickening of the gastric body suggesting Acute Gastritis. Alternatively this might be reactive due to #1. 4. Mild generalized right colon wall thickening, favor reactive due to #1. No evidence of bowel obstruction. 5. Bilateral lung base opacity most resembles atelectasis. 6. Aortic Atherosclerosis (ICD10-I70.0). Electronically Signed   By: HGenevie AnnM.D.   On: 12/13/2019  02:43   IMPRESSION:  *Vomiting/hematemesis: Patient reports frequent daily vomiting.  Hematemesis may be from some esophagitis related to his frequent vomiting episodes.  Possible gastritis seen on CT scan. 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  Hypoalbuminemia.  Ascites, not able to receive paracentesis due to minimal/non-accessible fluid. *ETOH abuse: Using alcohol until just a couple days  ago.  When asked how much that he drinks he says "too much". *History of colon cancer in 2007 status post resection.  Last colonoscopy 2014 or 2015 at [facility]. *Dilated cardiomyopathy with a EF of 30 to 35% last year.  PLAN: *We will plan for both EGD and colonoscopy tomorrow. *Needs ETOH abstinence. *Pantoprazole 40 mg IV twice daily for now. *In light of ascites and leukocytosis he has been started on Rocephin for possible SBP.  Can continue for now.  Laban Emperor. Zehr  12/13/2019, 9:43 AM   Attending physician's note   I have taken a history, examined the patient and reviewed the chart. I agree with the Advanced Practitioner's note, impression and recommendations.  54 year old male with alcoholism admitted with vomiting, hematemesis intermittently and melena few weeks ago. Hemoglobin 9.4 on admission  Total bilirubin 4.6, INR 1.5, albumin 2.5  Discriminate function score less than 32, no indication for steroids  Leukocytosis, WBC count 15.1 Small volume ascites, not amenable for diagnostic paracentesis He is on prophylactic ceftriaxone for SBP, okay to continue for now  History of colon cancer, last colonoscopy> 5 years ago at [facility], records not available to review.  We will schedule for EGD and colonoscopy to evaluate for heme positive anemia, hematemesis and melena  Dilated cardiomyopathy with EF 30 to 35%  Discussed alcohol cessation, patient wants to go to rehab after his discharge from the hospital  The risks and benefits as well as alternatives of endoscopic procedure(s) have been discussed and reviewed. All questions answered. The patient agrees to proceed.  Damaris Hippo , MD [phone]

## 2019-12-14 ENCOUNTER — Inpatient Hospital Stay (HOSPITAL_COMMUNITY): Payer: Self-pay | Admitting: Anesthesiology

## 2019-12-14 ENCOUNTER — Encounter (HOSPITAL_COMMUNITY)
Admission: EM | Disposition: A | Discharge status: Home / Self Care | Payer: Self-pay | Source: Home / Self Care | Attending: Internal Medicine

## 2019-12-14 ENCOUNTER — Encounter (HOSPITAL_COMMUNITY): Payer: Self-pay | Admitting: Internal Medicine

## 2019-12-14 DIAGNOSIS — K319 Disease of stomach and duodenum, unspecified: Secondary | ICD-10-CM

## 2019-12-14 DIAGNOSIS — K922 Gastrointestinal hemorrhage, unspecified: Secondary | ICD-10-CM

## 2019-12-14 DIAGNOSIS — F1010 Alcohol abuse, uncomplicated: Secondary | ICD-10-CM

## 2019-12-14 DIAGNOSIS — K2100 Gastro-esophageal reflux disease with esophagitis, without bleeding: Secondary | ICD-10-CM

## 2019-12-14 DIAGNOSIS — Z85038 Personal history of other malignant neoplasm of large intestine: Secondary | ICD-10-CM

## 2019-12-14 DIAGNOSIS — K3189 Other diseases of stomach and duodenum: Secondary | ICD-10-CM

## 2019-12-14 DIAGNOSIS — K633 Ulcer of intestine: Secondary | ICD-10-CM

## 2019-12-14 DIAGNOSIS — R112 Nausea with vomiting, unspecified: Secondary | ICD-10-CM

## 2019-12-14 DIAGNOSIS — K635 Polyp of colon: Secondary | ICD-10-CM

## 2019-12-14 HISTORY — PX: ESOPHAGOGASTRODUODENOSCOPY (EGD) WITH PROPOFOL: SHX5813

## 2019-12-14 HISTORY — PX: COLONOSCOPY WITH PROPOFOL: SHX5780

## 2019-12-14 HISTORY — PX: BIOPSY: SHX5522

## 2019-12-14 HISTORY — PX: POLYPECTOMY: SHX5525

## 2019-12-14 LAB — COMPREHENSIVE METABOLIC PANEL
ALT: 23 U/L (ref 0–44)
AST: 81 U/L — ABNORMAL HIGH (ref 15–41)
Albumin: 2.3 g/dL — ABNORMAL LOW (ref 3.5–5.0)
Alkaline Phosphatase: 213 U/L — ABNORMAL HIGH (ref 38–126)
Anion gap: 13 (ref 5–15)
BUN: <5 mg/dL — ABNORMAL LOW (ref 6–20)
CO2: 22 mmol/L (ref 22–32)
Calcium: 8 mg/dL — ABNORMAL LOW (ref 8.9–10.3)
Chloride: 98 mmol/L (ref 98–111)
Creatinine, Ser: 0.43 mg/dL — ABNORMAL LOW (ref 0.61–1.24)
GFR calc Af Amer: >60 mL/min (ref >60)
GFR calc non Af Amer: >60 mL/min (ref >60)
Glucose, Bld: 86 mg/dL (ref 70–99)
Potassium: 4.1 mmol/L (ref 3.5–5.1)
Sodium: 133 mmol/L — ABNORMAL LOW (ref 135–145)
Total Bilirubin: 4.1 mg/dL — ABNORMAL HIGH (ref 0.3–1.2)
Total Protein: 6.3 g/dL — ABNORMAL LOW (ref 6.5–8.1)

## 2019-12-14 LAB — GLUCOSE, CAPILLARY
Glucose-Capillary: 71 mg/dL (ref 70–99)
Glucose-Capillary: 75 mg/dL (ref 70–99)
Glucose-Capillary: 95 mg/dL (ref 70–99)

## 2019-12-14 LAB — SURGICAL PCR SCREEN
MRSA, PCR: NEGATIVE
Staphylococcus aureus: POSITIVE — AB

## 2019-12-14 SURGERY — ESOPHAGOGASTRODUODENOSCOPY (EGD) WITH PROPOFOL
Anesthesia: Monitor Anesthesia Care

## 2019-12-14 MED ORDER — PROPOFOL 500 MG/50ML IV EMUL
INTRAVENOUS | Status: DC | PRN
  Administered 2019-12-14: 100 ug/kg/min via INTRAVENOUS

## 2019-12-14 MED ORDER — PROPOFOL 10 MG/ML IV BOLUS
INTRAVENOUS | Status: DC | PRN
  Administered 2019-12-14: 70 mg via INTRAVENOUS

## 2019-12-14 MED ORDER — PANTOPRAZOLE SODIUM 40 MG PO TBEC
40.0000 mg | DELAYED_RELEASE_TABLET | Freq: Two times a day (BID) | ORAL | Status: DC
  Administered 2019-12-14: 40 mg via ORAL
  Filled 2019-12-14: qty 1

## 2019-12-14 MED ORDER — FOLIC ACID 1 MG PO TABS: 1.0000 mg | ORAL_TABLET | Freq: Every day | ORAL | 0 refills | Status: DC

## 2019-12-14 MED ORDER — ADULT MULTIVITAMIN W/MINERALS CH: 1.0000 | ORAL_TABLET | Freq: Every day | ORAL | Status: DC

## 2019-12-14 MED ORDER — THIAMINE HCL 100 MG PO TABS: 100.0000 mg | ORAL_TABLET | Freq: Every day | ORAL | 0 refills | Status: DC

## 2019-12-14 MED ORDER — PANTOPRAZOLE SODIUM 40 MG PO TBEC: 40.0000 mg | DELAYED_RELEASE_TABLET | Freq: Two times a day (BID) | ORAL | 0 refills | Status: DC

## 2019-12-14 MED ORDER — NICOTINE 21 MG/24HR TD PT24: 21.0000 mg | MEDICATED_PATCH | Freq: Every day | TRANSDERMAL | 0 refills | Status: DC

## 2019-12-14 MED ORDER — LIDOCAINE 2% (20 MG/ML) 5 ML SYRINGE
INTRAMUSCULAR | Status: DC | PRN
  Administered 2019-12-14: 80 mg via INTRAVENOUS

## 2019-12-14 MED ORDER — LACTATED RINGERS IV SOLN
INTRAVENOUS | Status: AC | PRN
  Administered 2019-12-14: 1000 mL via INTRAVENOUS

## 2019-12-14 MED ORDER — ACETAMINOPHEN 500 MG PO TABS: 500.0000 mg | ORAL_TABLET | Freq: Four times a day (QID) | ORAL | Status: DC | PRN

## 2019-12-14 MED ORDER — PHENYLEPHRINE HCL (PRESSORS) 10 MG/ML IV SOLN
INTRAVENOUS | Status: DC | PRN
  Administered 2019-12-14 (×3): 80 ug via INTRAVENOUS

## 2019-12-14 SURGICAL SUPPLY — 25 items

## 2019-12-14 NOTE — Anesthesia Postprocedure Evaluation (Signed)
Anesthesia Post Note  Patient: Richard [name]  Procedure(s) Performed: ESOPHAGOGASTRODUODENOSCOPY (EGD) WITH PROPOFOL (N/A ) COLONOSCOPY WITH PROPOFOL (N/A ) POLYPECTOMY BIOPSY     Patient location during evaluation: Endoscopy Anesthesia Type: MAC Level of consciousness: awake Pain management: pain level controlled Vital Signs Assessment: post-procedure vital signs reviewed and stable Respiratory status: spontaneous breathing Cardiovascular status: stable Postop Assessment: no apparent nausea or vomiting Anesthetic complications: no    Last Vitals:  Vitals:   12/14/19 0830 12/14/19 0901  BP: 106/67 109/76  Pulse: 90 88  Resp: (!) 22 10  Temp:  36.4 C  SpO2: 99% 99%    Last Pain:  Vitals:   12/14/19 0827  TempSrc:   PainSc: 0-No pain                 Brie Eppard

## 2019-12-14 NOTE — Op Note (Signed)
[facility] Patient Name: Richard [name] Procedure Date: 12/14/2019 MRN: [id number] Attending MD: Mauri Pole , MD Date of Birth: 04-Jan-1966 CSN: [id number] Age: 54 Admit Type: Inpatient Procedure:                Colonoscopy Indications:              Evaluation of unexplained GI bleeding presenting                            with fecal occult blood, Unexplained iron                            deficiency anemia. H/o colon cancer s/p resection                            in 2007. Last colonoscopy in 2014. Providers:                Mauri Pole, MD, Barbie Banner, Technician, Eliberto Ivory, CRNA Referring MD:              Medicines:                Monitored Anesthesia Care Complications:            No immediate complications. Estimated Blood Loss:     Estimated blood loss was minimal. Procedure:                Pre-Anesthesia Assessment:                           - Prior to the procedure, a History and Physical                            was performed, and patient medications and                            allergies were reviewed. The patient's tolerance of                            previous anesthesia was also reviewed. The risks                            and benefits of the procedure and the sedation                            options and risks were discussed with the patient.                            All questions were answered, and informed consent                            was obtained. Prior Anticoagulants: The patient has  taken no previous anticoagulant or antiplatelet                            agents. ASA Grade Assessment: III - A patient with                            severe systemic disease. After reviewing the risks                            and benefits, the patient was deemed in                            satisfactory condition to undergo the procedure.         After obtaining informed consent, the colonoscope                            was passed under direct vision. Throughout the                            procedure, the patient's blood pressure, pulse, and                            oxygen saturations were monitored continuously. The                            [id number] Olympus pediatric colonscope                            was introduced through the anus and advanced to the                            the terminal ileum, with identification of the                            appendiceal orifice and IC valve. The colonoscopy                            was performed without difficulty. The patient                            tolerated the procedure well. The quality of the                            bowel preparation was adequate. The ileocecal                            valve, appendiceal orifice, and rectum were                            photographed. Scope In: 7:55:04 AM Scope Out: 8:11:38 AM Scope Withdrawal Time: 0 hours 12 minutes 26 seconds  Total Procedure Duration: 0 hours 16 minutes 34 seconds  Findings:      The perianal and digital rectal examinations were normal.  A few patchy non-bleeding erosions were found in the sigmoid colon, in       the ascending colon and at the ileocecal valve. Biopsies were taken from       IC valve with a cold forceps for histology.      A 2 mm polyp was found in the ascending colon. The polyp was sessile.       The polyp was removed with a cold biopsy forceps. Resection and       retrieval were complete.      There was evidence of a prior functional end-to-end colo-colonic       anastomosis in the descending colon. This was patent and was       characterized by healthy appearing mucosa. The anastomosis was traversed.      Non-bleeding external and internal hemorrhoids were found during       retroflexion. The hemorrhoids were medium-sized. Impression:               - A few  erosions in the sigmoid colon, in the                            ascending colon and at the ileocecal valve.                            Biopsied.                           - One 2 mm polyp in the ascending colon, removed                            with a cold biopsy forceps. Resected and retrieved.                           - Patent functional end-to-end colo-colonic                            anastomosis, characterized by healthy appearing                            mucosa.                           - Non-bleeding external and internal hemorrhoids. Moderate Sedation:      Not Applicable - Patient had care per Anesthesia. Recommendation:           - Patient has a contact number available for                            emergencies. The signs and symptoms of potential                            delayed complications were discussed with the                            patient. Return to normal activities tomorrow.  Written discharge instructions were provided to the                            patient.                           - Resume previous diet.                           - Continue present medications.                           - Await pathology results.                           - Repeat colonoscopy in 5 years for surveillance                            based on pathology results.                           - See the other procedure note for documentation of                            additional recommendations. Procedure Code(s):        --- Professional ---                           [phone], Colonoscopy, flexible; with biopsy, single                            or multiple Diagnosis Code(s):        --- Professional ---                           K63.3, Ulcer of intestine                           K63.5, Polyp of colon                           Z98.0, Intestinal bypass and anastomosis status                           K64.8, Other hemorrhoids                            R19.5, Other fecal abnormalities                           D50.9, Iron deficiency anemia, unspecified CPT copyright 2019 American Medical Association. All rights reserved. The codes documented in this report are preliminary and upon coder review may  be revised to meet current compliance requirements. Mauri Pole, MD 12/14/2019 8:32:39 AM This report has been signed electronically. Number of Addenda: 0

## 2019-12-14 NOTE — Progress Notes (Signed)
Pt being discharged home. Discharge instructions and medication education provided to pt.  

## 2019-12-14 NOTE — Discharge Instructions (Signed)

## 2019-12-14 NOTE — Transfer of Care (Signed)
Immediate Anesthesia Transfer of Care Note  Patient: Jules Seliga Eskelson  Procedure(s) Performed: Procedure(s): ESOPHAGOGASTRODUODENOSCOPY (EGD) WITH PROPOFOL (N/A) COLONOSCOPY WITH PROPOFOL (N/A) POLYPECTOMY BIOPSY  Patient Location: PACU and Endoscopy Unit  Anesthesia Type:General  Level of Consciousness: awake, alert  and oriented  Airway & Oxygen Therapy: Patient Spontanous Breathing and Patient connected to nasal cannula oxygen  Post-op Assessment: Report given to RN and Post -op Vital signs reviewed and stable  Post vital signs: Reviewed and stable  Last Vitals:  Vitals:   12/14/19 0613 12/14/19 0708  BP: 110/74 111/73  Pulse: 90 85  Resp: 18 17  Temp: 36.8 C 36.9 C  SpO2: [id number]    Complications: No apparent anesthesia complications

## 2019-12-14 NOTE — Discharge Summary (Signed)
Physician Discharge Summary  Chou Beldin Jaco [id number] DOB: 02-13-1966  PCP: Antony Blackbird, MD  Admitted from: Home Discharged to: Home  Admit date: 12/12/2019 Discharge date: 12/14/2019  Recommendations for Outpatient Follow-up:   Follow-up Information    Fulp, Cammie, MD. Schedule an appointment as soon as possible for a visit in 1 week(s).   Specialty: Family Medicine Why: To be seen with repeat labs (CBC & CMP). Contact information: [place] [address] [phone]        Josue Hector, MD .   Specialty: Cardiology Contact information: [phone] [address] [place] [address] [phone]            Home Health: None Equipment/Devices: None  Discharge Condition: Improved and stable CODE STATUS: Full Diet recommendation: Heart healthy diet.  Discharge Diagnoses:  Principal Problem:   Abdominal pain Active Problems:   Intractable nausea and vomiting   Alcohol abuse   Heme positive stool   Protein-calorie malnutrition, severe   Gastropathy   Ascites   Polyp of ascending colon   History of colon cancer   Brief Summary: 54 year old male, lives with his brother, independent, PMH of alcohol and tobacco dependence, colon cancer s/p colectomy and in remission, pancreatitis, dilated cardiomyopathy with last LVEF 30-35% last year, GERD/PUD, HTN, presented to [facility] on 12/12/2019 due to diffuse abdominal pain, 3-4 episodes of vomiting every day with some blood in the emesis but no coffee-ground material. Admitted for abdominal pain, abnormal CT with liver abnormality, new ascites R/O SBP, high risk for alcohol withdrawal. [facility] consulted.   Assessment & Plan:   Abdominal pain with nausea and vomiting: With minimal blood in emesis but no coffee-ground or melena. Initially with DD: PUD/gastritis/esophagitis/acute alcoholic hepatitis (although LFTs argue against), pancreatitis (although normal lipase  argues against), SBP given new ascites versus others. Ultrasound-guided paracentesis by IR requested. Empirically started IV ceftriaxone. Treated supportively by NPO, IV fluids and IV PPI. [facility] consulted. As per IR, very little ascitic fluid to safely tap and hence not performed. Low index of clinical suspicion for SBP, as recommended by GI, discontinued ceftriaxone. Patient underwent EGD and colonoscopy on 3/13 with detailed findings as noted below. They will follow-up with patient with pathology results when available. EGD confirmed reflux esophagitis without bleeding and congestive gastropathy which may have been the cause of his symptoms on admission. He has been counseled extensively regarding avoiding aspirin or NSAIDs, tobacco cessation and alcohol abstinence. He has tolerated regular diet without complaints of abdominal pain, nausea or vomiting. GI signed off. Continue PPI at discharge.  Dehydration with hyponatremia: Secondary to poor oral intake and GI losses. Resolved after IV fluids. Mild hyponatremia but stable.  Hypokalemia: Replaced.  Hypomagnesemia: Replaced.  Anemia: May be related to chronic kidney disease. Stable. Follow CBC as outpatient.  Leukocytosis: Possibly stress response. No clinical suspicion for infection. Improving. Follow CBC as outpatient.  Acute on chronic hepatitis: Minimal transaminitis. Acute hepatitis panel and HIV screening negative. Alcohol abstinence counseled. LFTs stable/hyperbilirubinemia. CT abdomen results as below: Hepatomegaly with new highly heterogeneous liver enhancement superimposed on chronic hepatic steatosis. Outpatient follow-up with PCP with periodic labs.  Alcohol dependence: Reportedly consumes 1.5 pint of vodka daily, last drink 2-3 days PTA. At high risk for alcohol withdrawal and associated complications. CIWA protocol with scheduled and as needed Ativan. However in the hospital he did not develop any overt withdrawal. His CIWA  scores remained 0 and he did not require any  benzodiazepines. Continue thiamine, folate and multivitamins.  Tobacco abuse: Cessation counseled. Nicotine patch started per his request, continue at discharge.  Hypertension: Currently controlled off of medications. Patient likely noncompliant and probably quit taking his medications.  Cardiomyopathy: LVEF 30-35% by TTE 11/08/2018. Clinically euvolemic. May follow-up as outpatient with cardiology.  Body mass index is 16.27 kg/m.   THC abuse: Cessation counseled.  History of falls: Reports that he has fallen a couple times at home. Unclear if this is in his inebriated state. Patient ambulated steadily and declines PT evaluation.  History of colon cancer, s/p colectomy: No abnormal findings on colonoscopy as noted below.    Consultations:  [facility]  Procedures:  Colonoscopy 12/14/2019:  Impression:  - A few erosions in the sigmoid colon, in the ascending colon and at the ileocecal valve. Biopsied. - One 2 mm polyp in the ascending colon, removed with a cold biopsy forceps. Resected and retrieved. - Patent functional end-to-end colo-colonic anastomosis, characterized by healthy appearing mucosa. - Non-bleeding external and internal hemorrhoids.  Recommendations:  - Patient has a contact number available for emergencies. The signs and symptoms of potential delayed complications were discussed with the patient. Return to normal activities tomorrow. Written discharge instructions were provided to the patient. - Resume previous diet. - Continue present medications. - Await pathology results. - Repeat colonoscopy in 5 years for surveillance based on pathology results. - See the other procedure note for documentation of additional recommendations.  EGD 12/14/2019  Impression:  - LA Grade B reflux esophagitis with no bleeding. - Small hiatal hernia. - Congestive gastropathy. - Normal examined duodenum. - No specimens  collected.  Recommendations:  - Patient has a contact number available for emergencies. The signs and symptoms of potential delayed complications were discussed with the patient. Return to normal activities tomorrow. Written discharge instructions were provided to the patient. - Resume previous diet. - Continue present medications. - No aspirin, ibuprofen, naproxen, or other non-steroidal anti-inflammatory drugs. - Alcohol cessation - Protonix 40mg  BID, before meals - Can DC Ceftriaxone for SBP prophylaxis no other source of infection - GI will sign off, available if have any questions  Discharge Instructions  Discharge Instructions    Call MD for:   Complete by: As directed    Recurrent vomiting blood or coffee ground colored material or passing black tarry stools or blood in stools.   Call MD for:  difficulty breathing, headache or visual disturbances   Complete by: As directed    Call MD for:  extreme fatigue   Complete by: As directed    Call MD for:  persistant dizziness or light-headedness   Complete by: As directed    Call MD for:  persistant nausea and vomiting   Complete by: As directed    Call MD for:  severe uncontrolled pain   Complete by: As directed    Call MD for:  temperature >100.4   Complete by: As directed    Diet - low sodium heart healthy   Complete by: As directed    Increase activity slowly   Complete by: As directed        Medication List    STOP taking these medications   losartan 100 MG tablet Commonly known as: COZAAR   naltrexone 50 MG tablet Commonly known as: DEPADE     TAKE these medications   acetaminophen 500 MG tablet Commonly known as: TYLENOL Take 1 tablet (500 mg total) by mouth every 6 (six) hours as needed for mild pain,  moderate pain or headache. What changed: how much to take   folic acid 1 MG tablet Commonly known as: FOLVITE Take 1 tablet (1 mg total) by mouth daily.   multivitamin with minerals Tabs tablet Take 1  tablet by mouth daily. Start taking on: December 15, 2019   nicotine 21 mg/24hr patch Commonly known as: NICODERM CQ - dosed in mg/24 hours Place 1 patch (21 mg total) onto the skin daily. Start taking on: December 15, 2019   pantoprazole 40 MG tablet Commonly known as: PROTONIX Take 1 tablet (40 mg total) by mouth 2 (two) times daily before a meal. What changed: when to take this   thiamine 100 MG tablet Take 1 tablet (100 mg total) by mouth daily.      Allergies  Allergen Reactions  . Aspirin Other (See Comments)    Acid reflux   . Penicillins Hives    Has patient had a PCN reaction causing immediate rash, facial/tongue/throat swelling, SOB or lightheadedness with hypotension: yes Has patient had a PCN reaction causing severe rash involving mucus membranes or skin necrosis: no Has patient had a PCN reaction that required hospitalization: no Has patient had a PCN reaction occurring within the last 10 years: no If all of the above answers are "NO", then may proceed with Cephalosporin use.       Procedures/Studies: CT ABDOMEN PELVIS W CONTRAST  Result Date: 12/13/2019 CLINICAL DATA:  54 year old male with distended abdomen, abnormal lactic acid. EXAM: CT ABDOMEN AND PELVIS WITH CONTRAST TECHNIQUE: Multidetector CT imaging of the abdomen and pelvis was performed using the standard protocol following bolus administration of intravenous contrast. CONTRAST:  125mL OMNIPAQUE IOHEXOL 300 MG/ML  SOLN COMPARISON:  CT Abdomen and Pelvis 11/05/2018 and earlier. FINDINGS: Lower chest: Bilateral lower lobe pulmonary opacity most resembles atelectasis. No pericardial or pleural effusion. Hepatobiliary: Progressed hepatomegaly since last year (liver length now 24 cm) with new diffusely heterogeneous and abnormal liver enhancement. Geographic areas of hyperenhancement superimposed on chronic hepatic steatosis. The hepatic veins and portal venous system remain patent. No discrete or masslike liver  lesion. The gallbladder is distended up to 6 cm diameter but there is no definite pericholecystic inflammation. Pancreas: Within normal limits. Spleen: Remains within normal limits, no splenomegaly. Adrenals/Urinary Tract: Normal adrenal glands. Bilateral renal enhancement and contrast excretion symmetric and within normal limits. Proximal ureters appear decompressed. No nephrolithiasis. Diminutive and unremarkable urinary bladder. Stomach/Bowel: Decompressed distal large bowel. There is a large bowel anastomosis in the left abdomen which appears similar to the CT last year. Similar gas and retained stool. The right colon appears decompressed although with mild generalized wall thickening. The terminal ileum has a more normal appearance. No dilated small bowel. Decompressed proximal stomach but at the distal gastric body there is circumferential gastric wall thickening with mucosal hyperenhancement (coronal image 32 and series 2, image 38). Fluid in the distal stomach. No free air. Small volume of free fluid throughout the abdomen with simple fluid density. The duodenum and proximal small bowel are within normal limits. Vascular/Lymphatic: Aortoiliac calcified atherosclerosis. Major arterial structures in the abdomen and pelvis are patent. Portal venous system remains patent. No lymphadenopathy. Reproductive: Negative. Other: Moderate to large volume of pelvic free fluid with simple fluid density. Musculoskeletal: No acute osseous abnormality identified. IMPRESSION: 1. Hepatomegaly with new highly heterogeneous liver enhancement superimposed on chronic hepatic steatosis. No hepatic vascular occlusion identified.  No discrete liver lesion. Superimposed new moderate volume ascites. Favor acute or acute on chronic Hepatitis. 2. Superimposed  distended gallbladder, but no CT evidence of acute cholecystitis. 3. New circumferential wall thickening of the gastric body suggesting Acute Gastritis. Alternatively this might be  reactive due to #1. 4. Mild generalized right colon wall thickening, favor reactive due to #1. No evidence of bowel obstruction. 5. Bilateral lung base opacity most resembles atelectasis. 6. Aortic Atherosclerosis (ICD10-I70.0). Electronically Signed   By: Genevie Ann M.D.   On: 12/13/2019 02:43   [place] ASCITES (ABDOMEN LIMITED)  Result Date: 12/13/2019 CLINICAL DATA:  Liver disease and ascites. EXAM: LIMITED ABDOMEN ULTRASOUND FOR ASCITES TECHNIQUE: Limited ultrasound survey for ascites was performed in all four abdominal quadrants. COMPARISON:  CT of the abdomen and pelvis earlier today. FINDINGS: By ultrasound, there is only a small amount of accessible ascites in the peritoneal cavity and not enough to allow for safe paracentesis. By CT, majority of fluid is in the deep dependent pelvis which is not accessible to percutaneous drainage. IMPRESSION: Small amount of accessible ascites in the peritoneal cavity. There was not enough fluid present to allow for safe paracentesis today. Electronically Signed   By: Aletta Edouard M.D.   On: 12/13/2019 11:34      Subjective: Patient denies complaints. No further nausea, vomiting, abdominal pain or BM since admission. Tolerated regular diet at breakfast without complaints. Ambulates steadily. No chest pain or dyspnea. No dizziness or lightheadedness.  Discharge Exam:  Vitals:   12/14/19 0821 12/14/19 0827 12/14/19 0830 12/14/19 0901  BP:  105/68 106/67 109/76  Pulse: 83 89 90 88  Resp: (!) 23 17 (!) 22 10  Temp:    97.6 F (36.4 C)  TempSrc:      SpO2: 99% 100% 99% 99%  Weight:      Height:        General exam: Pleasant middle-age male, moderately built thinly nourished lying comfortably propped up in bed without distress. Oral mucosa moist. Respiratory system: Clear to auscultation. Respiratory effort normal. Cardiovascular system: S1 & S2 heard, RRR. No JVD, murmurs, rubs, gallops or clicks. No pedal edema. Telemetry personally reviewed: Sinus  rhythm. Gastrointestinal system:  Abdomen is nondistended, soft and nontender. No organomegaly or masses appreciated. Normal bowel sounds heard. Central nervous system: Alert and oriented. No focal neurological deficits. Extremities: Symmetric 5 x 5 power. Skin: No rashes, lesions or ulcers Psychiatry: Judgement and insight appear normal. Mood & affect appropriate.  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   The results of significant diagnostics from this hospitalization (including imaging, microbiology, ancillary and laboratory) are listed below for reference.     Microbiology: Recent Results (from the past 240 hour(s))  SARS CORONAVIRUS 2 (TAT 6-24 HRS) Nasopharyngeal Nasopharyngeal Swab     Status: None   Collection Time: 12/13/19  4:00 AM   Specimen: Nasopharyngeal Swab  Result Value Ref Range Status   SARS Coronavirus 2 NEGATIVE NEGATIVE Final    Comment: (NOTE) SARS-CoV-2 target nucleic acids are NOT DETECTED. The SARS-CoV-2 RNA is generally detectable in upper and lower respiratory specimens during the acute phase of infection. Negative results do not preclude SARS-CoV-2 infection, do not rule out co-infections with other pathogens, and should not be used as the sole basis for treatment or other patient management decisions. Negative results must be combined with clinical observations, patient history, and epidemiological information. The expected result is Negative. Fact Sheet for Patients: SugarRoll.be Fact Sheet for Healthcare Providers: https://www.woods-mathews.com/ This test is not yet approved or cleared by the [place] FDA and  has been authorized for detection  and/or diagnosis of SARS-CoV-2 by FDA under an Emergency Use Authorization (EUA). This EUA will remain  in effect (meaning this test can be used) for the duration of the COVID-19 declaration under Section 56 4(b)(1) of the Act, 21 U.S.C. section 360bbb-3(b)(1),  unless the authorization is terminated or revoked sooner. Performed at [facility], [place] [address], [place], [place] [address]   Surgical pcr screen     Status: Abnormal   Collection Time: 12/14/19 12:42 AM   Specimen: Nasal Mucosa; Nasal Swab  Result Value Ref Range Status   MRSA, PCR NEGATIVE NEGATIVE Final   Staphylococcus aureus POSITIVE (A) NEGATIVE Final    Comment: RESULT CALLED TO, READ BACK BY AND VERIFIED WITH: CUMMINS, RN ON 12/14/19 @ 0342 BY LE (NOTE) The Xpert SA Assay (FDA approved for NASAL specimens in patients 90 years of age and older), is one component of a comprehensive surveillance program. It is not intended to diagnose infection nor to guide or monitor treatment. Performed at [facility], [place] [address] [place], [place] [address]      Labs: CBC: Recent Labs  Lab 12/13/19 0001 12/13/19 0509  WBC 17.5* 15.1*  NEUTROABS 14.1*  --   HGB 9.2* 9.4*  HCT 26.9* 27.2*  MCV 92.1 92.5  PLT 205 0000000    Basic Metabolic Panel: Recent Labs  Lab 12/13/19 0001 12/13/19 0509 12/13/19 1551 12/14/19 0602  NA 133* 132* 133* 133*  K 3.0* 3.0* 4.0 4.1  CL 93* 91* 95* 98  CO2 24 26 24 22   GLUCOSE 79 77 87 86  BUN <5* <5* 5* <5*  CREATININE 0.49* 0.43* 0.42* 0.43*  CALCIUM 8.3* 8.0* 7.9* 8.0*  MG  --  1.4* 2.3  --     Liver Function Tests: Recent Labs  Lab 12/13/19 0001 12/13/19 0509 12/14/19 0602  AST 64* 65* 81*  ALT 23 19 23   ALKPHOS 205* 191* 213*  BILITOT 4.6* 4.2* 4.1*  PROT 6.9 6.1* 6.3*  ALBUMIN 2.5* 2.3* 2.3*    CBG: Recent Labs  Lab 12/13/19 1206 12/13/19 1748 12/14/19 0001 12/14/19 0108 12/14/19 0615  GLUCAP 75 78 71 95 75     Urinalysis    Component Value Date/Time   COLORURINE AMBER (A) 12/13/2019 0205   APPEARANCEUR CLEAR 12/13/2019 0205   LABSPEC 1.020 12/13/2019 0205   PHURINE 6.0 12/13/2019 0205   GLUCOSEU NEGATIVE 12/13/2019 0205   HGBUR NEGATIVE 12/13/2019 0205   BILIRUBINUR MODERATE  (A) 12/13/2019 0205   KETONESUR 20 (A) 12/13/2019 0205   PROTEINUR 30 (A) 12/13/2019 0205   UROBILINOGEN 0.2 01/13/2009 1011   NITRITE NEGATIVE 12/13/2019 0205   LEUKOCYTESUR NEGATIVE 12/13/2019 0205      Time coordinating discharge: 35 minutes  SIGNED:  Vernell Leep, MD, [place], [facility]. [facility]  To contact the attending provider between 7A-7P or the covering provider during after hours 7P-7A, please log into the web site [email or web address] and access using universal [place] password for that web site. If you do not have the password, please call the hospital operator.

## 2019-12-14 NOTE — Op Note (Signed)
[facility] Patient Name: Richard [name] Procedure Date: 12/14/2019 MRN: [id number] Attending MD: Mauri Pole , MD Date of Birth: Jan 09, 1966 CSN: [id number] Age: 54 Admit Type: Inpatient Procedure:                Upper GI endoscopy Indications:              Gastrointestinal bleeding of unknown origin,                            Suspected upper gastrointestinal bleeding in                            patient with unexplained iron deficiency anemia Providers:                Mauri Pole, MD, Barbie Banner, Technician, Eliberto Ivory, CRNA Referring MD:              Medicines:                Monitored Anesthesia Care Complications:            No immediate complications. Estimated Blood Loss:     Estimated blood loss was minimal. Procedure:                Pre-Anesthesia Assessment:                           - Prior to the procedure, a History and Physical                            was performed, and patient medications and                            allergies were reviewed. The patient's tolerance of                            previous anesthesia was also reviewed. The risks                            and benefits of the procedure and the sedation                            options and risks were discussed with the patient.                            All questions were answered, and informed consent                            was obtained. Prior Anticoagulants: The patient has                            taken no previous anticoagulant or antiplatelet  agents. ASA Grade Assessment: III - A patient with                            severe systemic disease. After reviewing the risks                            and benefits, the patient was deemed in                            satisfactory condition to undergo the procedure.                           After obtaining informed consent, the endoscope was                             passed under direct vision. Throughout the                            procedure, the patient's blood pressure, pulse, and                            oxygen saturations were monitored continuously. The                            [id number] Olympus gastroscope was                            introduced through the mouth, and advanced to the                            second part of duodenum. The upper GI endoscopy was                            accomplished without difficulty. The patient                            tolerated the procedure well. Scope In: Scope Out: Findings:      LA Grade B (one or more mucosal breaks greater than 5 mm, not extending       between the tops of two mucosal folds) esophagitis with no bleeding was       found 34 to 38 cm from the incisors. No esophageal varices      A small hiatal hernia was present.      Diffuse mildly congested mucosa was found in the entire examined       stomach. No Gastric varices      The examined duodenum was normal. Impression:               - LA Grade B reflux esophagitis with no bleeding.                           - Small hiatal hernia.                           - Congestive gastropathy.                           -  Normal examined duodenum.                           - No specimens collected. Moderate Sedation:      Not Applicable - Patient had care per Anesthesia. Recommendation:           - Patient has a contact number available for                            emergencies. The signs and symptoms of potential                            delayed complications were discussed with the                            patient. Return to normal activities tomorrow.                            Written discharge instructions were provided to the                            patient.                           - Resume previous diet.                           - Continue present medications.                            - No aspirin, ibuprofen, naproxen, or other                            non-steroidal anti-inflammatory drugs.                           - Alcohol cessation                           - Protonix 40mg  BID, before meals                           - Can DC Ceftriaxone for SBP prophylaxis no other                            source of infection                           - GI will sign off, available if have any questions Procedure Code(s):        --- Professional ---                           [phone], Esophagogastroduodenoscopy, flexible,                            transoral; diagnostic, including collection of  specimen(s) by brushing or washing, when performed                            (separate procedure) Diagnosis Code(s):        --- Professional ---                           K21.00, Gastro-esophageal reflux disease with                            esophagitis, without bleeding                           K44.9, Diaphragmatic hernia without obstruction or                            gangrene                           K31.89, Other diseases of stomach and duodenum                           K92.2, Gastrointestinal hemorrhage, unspecified                           D50.9, Iron deficiency anemia, unspecified CPT copyright 2019 American Medical Association. All rights reserved. The codes documented in this report are preliminary and upon coder review may  be revised to meet current compliance requirements. Mauri Pole, MD 12/14/2019 8:28:21 AM This report has been signed electronically. Number of Addenda: 0

## 2019-12-14 NOTE — Interval H&P Note (Signed)
History and Physical Interval Note:  12/14/2019 7:01 AM  Richard [name]  has presented today for surgery, with the diagnosis of Hematemesis, history of colon cancer.  The various methods of treatment have been discussed with the patient and family. After consideration of risks, benefits and other options for treatment, the patient has consented to  Procedure(s): ESOPHAGOGASTRODUODENOSCOPY (EGD) WITH PROPOFOL (N/A) COLONOSCOPY WITH PROPOFOL (N/A) as a surgical intervention.  The patient's history has been reviewed, patient examined, no change in status, stable for surgery.  I have reviewed the patient's chart and labs.  Questions were answered to the patient's satisfaction.     Dusan Lipford

## 2019-12-15 ENCOUNTER — Encounter: Payer: Self-pay | Admitting: *Deleted

## 2019-12-16 ENCOUNTER — Telehealth: Payer: Self-pay

## 2019-12-16 NOTE — Telephone Encounter (Signed)
Transition Care Management Follow-up Telephone Call  Date of discharge and from where: 12/23/2019, [facility]   How have you been since you were released from the hospital? He has been doing okay,   Any questions or concerns?  None at this time   Items Reviewed:  Did the pt receive and understand the discharge instructions provided?  he said that he has the instructions but needs to look a them again.   Medications obtained and verified?  he said that he has all medications except the vitamins.  No questions at this time. Instructed him to review the list and call this CM with any questions.   Any new allergies since your discharge?   None reported   Do you have support at home?  yes, he lives with his brother but his brother works all day,   Other (ie: DME, [place], etc) no home health ordered. No DME ordered   Functional Questionnaire: (I = Independent and D = Dependent) ADL's:independent   Follow up appointments reviewed:    PCP Hospital f/u appt confirmed? . Appointment scheduled with [facility] walk in provider 12/26/2019  Specialist Hospital f/u appt confirmed? .needs to call cardiology.  He has the phone number  Are transportation arrangements needed? he said that his mother will drive or he can take a bus.   If their condition worsens, is the pt aware to call  their PCP or go to the ED?  yes  Was the patient provided with contact information for the PCP's office or ED?  He has the phone number for the clinic  Was the pt encouraged to call back with questions or concerns?  yes

## 2019-12-17 LAB — SURGICAL PATHOLOGY

## 2019-12-23 ENCOUNTER — Ambulatory Visit: Payer: Self-pay | Admitting: Family

## 2019-12-24 ENCOUNTER — Encounter: Payer: Self-pay | Admitting: Gastroenterology

## 2019-12-25 NOTE — Progress Notes (Deleted)
Patient ID: Richard [name], male   DOB: [birth year], 54 y.o.   MRN: [id number] After hospitalization 3/1/-12/14/2019.  From discharge summary: Discharge Diagnoses:  Principal Problem:   Abdominal pain Active Problems:   Intractable nausea and vomiting   Alcohol abuse   Heme positive stool   Protein-calorie malnutrition, severe   Gastropathy   Ascites   Polyp of ascending colon   History of colon cancer   Brief Summary: 54 year old male, lives with his brother, independent, PMH of alcohol and tobacco dependence, colon cancer s/p colectomy and in remission, pancreatitis, dilated cardiomyopathy with last LVEF 30-35% last year, GERD/PUD, HTN, [facility] ED on 12/12/2019 due to diffuse abdominal pain, 3-4 episodes of vomiting every day with some blood in the emesis but no coffee-ground material. Admitted for abdominal pain, abnormal CT with liver abnormality, new ascites R/O SBP, high risk for alcohol withdrawal. [facility] consulted.   Assessment & Plan:   Abdominal pain with nausea and vomiting: With minimal blood in emesis but no coffee-ground or melena. Initially with DD: PUD/gastritis/esophagitis/acute alcoholic hepatitis (although LFTs argue against), pancreatitis (although normal lipase argues against), SBP given new ascites versus others. Ultrasound-guided paracentesis by IR requested. Empirically started IV ceftriaxone. Treated supportively byNPO, IV fluids and IV PPI. [facility] consulted. As per IR, very little ascitic fluid to safely tap and hence not performed. Low index of clinical suspicion for SBP, as recommended by GI, discontinued ceftriaxone. Patient underwent EGD and colonoscopy on 3/13 with detailed findings as noted below. They will follow-up with patient with pathology results when available. EGD confirmed reflux esophagitis without bleeding and congestive gastropathy which may have been the cause of his symptoms on admission. He has been  counseled extensively regarding avoiding aspirin or NSAIDs, tobacco cessation and alcohol abstinence. He has tolerated regular diet without complaints of abdominal pain, nausea or vomiting. GI signed off. Continue PPI at discharge.  Dehydration with hyponatremia: Secondary to poor oral intake and GI losses. Resolved after IV fluids. Mild hyponatremia but stable.  Hypokalemia: Replaced.  Hypomagnesemia: Replaced.  Anemia: May be related to chronic kidney disease. Stable. Follow CBC as outpatient.  Leukocytosis: Possibly stress response. No clinical suspicion for infection. Improving. Follow CBC as outpatient.  Acute on chronic hepatitis: Minimal transaminitis. Acute hepatitis panel and HIV screening negative. Alcohol abstinence counseled. LFTs stable/hyperbilirubinemia. CT abdomen results as below: Hepatomegaly with new highly heterogeneous liver enhancement superimposed on chronic hepatic steatosis. Outpatient follow-up with PCP with periodic labs.  Alcohol dependence: Reportedly consumes 1.5 pint of vodka daily, last drink 2-3 days PTA. At high risk for alcohol withdrawal and associated complications. CIWA protocol with scheduled and as needed Ativan. However in the hospital he did not develop any overt withdrawal. His CIWA scores remained 0 and he did not require any benzodiazepines. Continue thiamine, folate and multivitamins.  Tobacco abuse: Cessation counseled. Nicotine patch started per his request, continue at discharge.  Hypertension: Currently controlled off of medications. Patient likely noncompliant and probably quit taking his medications.  Cardiomyopathy: LVEF 30-35% by TTE 11/08/2018. Clinically euvolemic. May follow-up as outpatient with cardiology.  Body mass index is 16.27 kg/m.  THC abuse: Cessation counseled.  History of falls: Reports that he has fallen a couple times at home. Unclear if this is in his inebriated state. Patient ambulated steadily and  declines PT evaluation.  History of colon cancer, s/p colectomy: No abnormal findings on colonoscopy as noted below.

## 2019-12-26 ENCOUNTER — Ambulatory Visit: Payer: Self-pay

## 2020-01-06 ENCOUNTER — Inpatient Hospital Stay (HOSPITAL_COMMUNITY)
Admission: EM | Admit: 2020-01-06 | Discharge: 2020-01-16 | DRG: 432 | Disposition: A | Discharge status: Home / Self Care | Payer: Self-pay | Attending: Internal Medicine | Admitting: Internal Medicine

## 2020-01-06 ENCOUNTER — Other Ambulatory Visit: Payer: Self-pay

## 2020-01-06 ENCOUNTER — Encounter (HOSPITAL_COMMUNITY): Payer: Self-pay

## 2020-01-06 DIAGNOSIS — Z886 Allergy status to analgesic agent status: Secondary | ICD-10-CM

## 2020-01-06 DIAGNOSIS — K652 Spontaneous bacterial peritonitis: Secondary | ICD-10-CM | POA: Diagnosis present

## 2020-01-06 DIAGNOSIS — B1920 Unspecified viral hepatitis C without hepatic coma: Secondary | ICD-10-CM | POA: Diagnosis present

## 2020-01-06 DIAGNOSIS — K449 Diaphragmatic hernia without obstruction or gangrene: Secondary | ICD-10-CM | POA: Diagnosis present

## 2020-01-06 DIAGNOSIS — I420 Dilated cardiomyopathy: Secondary | ICD-10-CM | POA: Diagnosis present

## 2020-01-06 DIAGNOSIS — R9431 Abnormal electrocardiogram [ECG] [EKG]: Secondary | ICD-10-CM

## 2020-01-06 DIAGNOSIS — I5022 Chronic systolic (congestive) heart failure: Secondary | ICD-10-CM | POA: Diagnosis present

## 2020-01-06 DIAGNOSIS — K7010 Alcoholic hepatitis without ascites: Secondary | ICD-10-CM | POA: Diagnosis present

## 2020-01-06 DIAGNOSIS — R64 Cachexia: Secondary | ICD-10-CM | POA: Diagnosis present

## 2020-01-06 DIAGNOSIS — K7031 Alcoholic cirrhosis of liver with ascites: Secondary | ICD-10-CM | POA: Diagnosis present

## 2020-01-06 DIAGNOSIS — E871 Hypo-osmolality and hyponatremia: Secondary | ICD-10-CM | POA: Diagnosis present

## 2020-01-06 DIAGNOSIS — Z8711 Personal history of peptic ulcer disease: Secondary | ICD-10-CM

## 2020-01-06 DIAGNOSIS — J9811 Atelectasis: Secondary | ICD-10-CM | POA: Diagnosis present

## 2020-01-06 DIAGNOSIS — Z79899 Other long term (current) drug therapy: Secondary | ICD-10-CM

## 2020-01-06 DIAGNOSIS — F1021 Alcohol dependence, in remission: Secondary | ICD-10-CM | POA: Diagnosis present

## 2020-01-06 DIAGNOSIS — K5900 Constipation, unspecified: Secondary | ICD-10-CM | POA: Diagnosis present

## 2020-01-06 DIAGNOSIS — K3189 Other diseases of stomach and duodenum: Secondary | ICD-10-CM | POA: Diagnosis present

## 2020-01-06 DIAGNOSIS — D6859 Other primary thrombophilia: Secondary | ICD-10-CM | POA: Diagnosis present

## 2020-01-06 DIAGNOSIS — F10239 Alcohol dependence with withdrawal, unspecified: Secondary | ICD-10-CM | POA: Diagnosis present

## 2020-01-06 DIAGNOSIS — I2129 ST elevation (STEMI) myocardial infarction involving other sites: Secondary | ICD-10-CM | POA: Diagnosis present

## 2020-01-06 DIAGNOSIS — R079 Chest pain, unspecified: Secondary | ICD-10-CM

## 2020-01-06 DIAGNOSIS — Z880 Allergy status to penicillin: Secondary | ICD-10-CM

## 2020-01-06 DIAGNOSIS — I5043 Acute on chronic combined systolic (congestive) and diastolic (congestive) heart failure: Secondary | ICD-10-CM | POA: Diagnosis present

## 2020-01-06 DIAGNOSIS — I2510 Atherosclerotic heart disease of native coronary artery without angina pectoris: Secondary | ICD-10-CM | POA: Diagnosis present

## 2020-01-06 DIAGNOSIS — D72829 Elevated white blood cell count, unspecified: Secondary | ICD-10-CM | POA: Diagnosis present

## 2020-01-06 DIAGNOSIS — R931 Abnormal findings on diagnostic imaging of heart and coronary circulation: Secondary | ICD-10-CM

## 2020-01-06 DIAGNOSIS — Z20822 Contact with and (suspected) exposure to covid-19: Secondary | ICD-10-CM | POA: Diagnosis present

## 2020-01-06 DIAGNOSIS — I110 Hypertensive heart disease with heart failure: Secondary | ICD-10-CM | POA: Diagnosis present

## 2020-01-06 DIAGNOSIS — R1084 Generalized abdominal pain: Secondary | ICD-10-CM

## 2020-01-06 DIAGNOSIS — Z951 Presence of aortocoronary bypass graft: Secondary | ICD-10-CM

## 2020-01-06 DIAGNOSIS — T380X5A Adverse effect of glucocorticoids and synthetic analogues, initial encounter: Secondary | ICD-10-CM | POA: Diagnosis present

## 2020-01-06 DIAGNOSIS — Z800 Family history of malignant neoplasm of digestive organs: Secondary | ICD-10-CM

## 2020-01-06 DIAGNOSIS — F1010 Alcohol abuse, uncomplicated: Secondary | ICD-10-CM | POA: Diagnosis present

## 2020-01-06 DIAGNOSIS — K7011 Alcoholic hepatitis with ascites: Principal | ICD-10-CM | POA: Diagnosis present

## 2020-01-06 DIAGNOSIS — Z85038 Personal history of other malignant neoplasm of large intestine: Secondary | ICD-10-CM

## 2020-01-06 DIAGNOSIS — F419 Anxiety disorder, unspecified: Secondary | ICD-10-CM | POA: Diagnosis present

## 2020-01-06 DIAGNOSIS — Z681 Body mass index (BMI) 19 or less, adult: Secondary | ICD-10-CM

## 2020-01-06 DIAGNOSIS — K219 Gastro-esophageal reflux disease without esophagitis: Secondary | ICD-10-CM | POA: Diagnosis present

## 2020-01-06 DIAGNOSIS — F1910 Other psychoactive substance abuse, uncomplicated: Secondary | ICD-10-CM | POA: Diagnosis present

## 2020-01-06 DIAGNOSIS — K7290 Hepatic failure, unspecified without coma: Secondary | ICD-10-CM | POA: Diagnosis present

## 2020-01-06 DIAGNOSIS — E876 Hypokalemia: Secondary | ICD-10-CM | POA: Diagnosis present

## 2020-01-06 DIAGNOSIS — F17210 Nicotine dependence, cigarettes, uncomplicated: Secondary | ICD-10-CM | POA: Diagnosis present

## 2020-01-06 DIAGNOSIS — E8809 Other disorders of plasma-protein metabolism, not elsewhere classified: Secondary | ICD-10-CM | POA: Diagnosis present

## 2020-01-06 DIAGNOSIS — K859 Acute pancreatitis without necrosis or infection, unspecified: Secondary | ICD-10-CM | POA: Diagnosis present

## 2020-01-06 DIAGNOSIS — D649 Anemia, unspecified: Secondary | ICD-10-CM | POA: Diagnosis present

## 2020-01-06 DIAGNOSIS — I959 Hypotension, unspecified: Secondary | ICD-10-CM | POA: Diagnosis present

## 2020-01-06 DIAGNOSIS — Z720 Tobacco use: Secondary | ICD-10-CM | POA: Diagnosis present

## 2020-01-06 DIAGNOSIS — G9341 Metabolic encephalopathy: Secondary | ICD-10-CM | POA: Diagnosis present

## 2020-01-06 DIAGNOSIS — R188 Other ascites: Secondary | ICD-10-CM

## 2020-01-06 DIAGNOSIS — I10 Essential (primary) hypertension: Secondary | ICD-10-CM | POA: Diagnosis present

## 2020-01-06 DIAGNOSIS — I426 Alcoholic cardiomyopathy: Secondary | ICD-10-CM | POA: Diagnosis present

## 2020-01-06 DIAGNOSIS — Z9049 Acquired absence of other specified parts of digestive tract: Secondary | ICD-10-CM

## 2020-01-06 DIAGNOSIS — F1290 Cannabis use, unspecified, uncomplicated: Secondary | ICD-10-CM | POA: Diagnosis present

## 2020-01-06 NOTE — ED Triage Notes (Signed)
Patient arrived via [facility] with complaints of abdominal pain and distention that has increased over the last three weeks since he was admitted for alcohol detox, denies any use since. Reports increased generalized weakness and bilateral pitting leg edema. Taking tylenol for pain with little relief. Last BM one week ago.

## 2020-01-07 ENCOUNTER — Encounter (HOSPITAL_COMMUNITY): Payer: Self-pay | Admitting: Family Medicine

## 2020-01-07 ENCOUNTER — Emergency Department (HOSPITAL_COMMUNITY): Payer: Self-pay

## 2020-01-07 ENCOUNTER — Inpatient Hospital Stay (HOSPITAL_COMMUNITY): Payer: Self-pay

## 2020-01-07 DIAGNOSIS — F1910 Other psychoactive substance abuse, uncomplicated: Secondary | ICD-10-CM | POA: Diagnosis present

## 2020-01-07 DIAGNOSIS — K652 Spontaneous bacterial peritonitis: Secondary | ICD-10-CM

## 2020-01-07 DIAGNOSIS — I429 Cardiomyopathy, unspecified: Secondary | ICD-10-CM

## 2020-01-07 DIAGNOSIS — R9431 Abnormal electrocardiogram [ECG] [EKG]: Secondary | ICD-10-CM

## 2020-01-07 DIAGNOSIS — Z720 Tobacco use: Secondary | ICD-10-CM | POA: Diagnosis present

## 2020-01-07 DIAGNOSIS — I426 Alcoholic cardiomyopathy: Secondary | ICD-10-CM | POA: Diagnosis present

## 2020-01-07 DIAGNOSIS — I2129 ST elevation (STEMI) myocardial infarction involving other sites: Secondary | ICD-10-CM

## 2020-01-07 DIAGNOSIS — E876 Hypokalemia: Secondary | ICD-10-CM

## 2020-01-07 DIAGNOSIS — D72829 Elevated white blood cell count, unspecified: Secondary | ICD-10-CM | POA: Diagnosis present

## 2020-01-07 DIAGNOSIS — I5022 Chronic systolic (congestive) heart failure: Secondary | ICD-10-CM

## 2020-01-07 DIAGNOSIS — F1010 Alcohol abuse, uncomplicated: Secondary | ICD-10-CM

## 2020-01-07 DIAGNOSIS — K7010 Alcoholic hepatitis without ascites: Secondary | ICD-10-CM | POA: Diagnosis present

## 2020-01-07 DIAGNOSIS — I420 Dilated cardiomyopathy: Secondary | ICD-10-CM | POA: Diagnosis present

## 2020-01-07 DIAGNOSIS — I10 Essential (primary) hypertension: Secondary | ICD-10-CM

## 2020-01-07 DIAGNOSIS — K7011 Alcoholic hepatitis with ascites: Principal | ICD-10-CM

## 2020-01-07 HISTORY — DX: Hypokalemia: E87.6

## 2020-01-07 HISTORY — DX: Elevated white blood cell count, unspecified: D72.829

## 2020-01-07 HISTORY — DX: Spontaneous bacterial peritonitis: K65.2

## 2020-01-07 HISTORY — DX: ST elevation (STEMI) myocardial infarction involving other sites: I21.29

## 2020-01-07 HISTORY — DX: Alcoholic cardiomyopathy: I42.6

## 2020-01-07 HISTORY — DX: Other psychoactive substance abuse, uncomplicated: F19.10

## 2020-01-07 LAB — BODY FLUID CELL COUNT WITH DIFFERENTIAL
Eos, Fluid: 0 %
Lymphs, Fluid: 32 %
Monocyte-Macrophage-Serous Fluid: 46 % — ABNORMAL LOW (ref 50–90)
Neutrophil Count, Fluid: 22 % (ref 0–25)
Total Nucleated Cell Count, Fluid: 124 cu mm (ref 0–1000)

## 2020-01-07 LAB — CBC WITH DIFFERENTIAL/PLATELET
Abs Immature Granulocytes: 0.16 10*3/uL — ABNORMAL HIGH (ref 0.00–0.07)
Abs Immature Granulocytes: 0.19 10*3/uL — ABNORMAL HIGH (ref 0.00–0.07)
Basophils Absolute: 0.1 10*3/uL (ref 0.0–0.1)
Basophils Absolute: 0.2 10*3/uL — ABNORMAL HIGH (ref 0.0–0.1)
Basophils Relative: 1 %
Basophils Relative: 1 %
Eosinophils Absolute: 0.6 10*3/uL — ABNORMAL HIGH (ref 0.0–0.5)
Eosinophils Absolute: 0.6 10*3/uL — ABNORMAL HIGH (ref 0.0–0.5)
Eosinophils Relative: 3 %
Eosinophils Relative: 2 %
HCT: 29.7 % — ABNORMAL LOW (ref 39.0–52.0)
HCT: 30 % — ABNORMAL LOW (ref 39.0–52.0)
Hemoglobin: 10.3 g/dL — ABNORMAL LOW (ref 13.0–17.0)
Hemoglobin: 10.2 g/dL — ABNORMAL LOW (ref 13.0–17.0)
Immature Granulocytes: 1 %
Immature Granulocytes: 1 %
Lymphocytes Relative: 7 %
Lymphocytes Relative: 8 %
Lymphs Abs: 1.6 10*3/uL (ref 0.7–4.0)
Lymphs Abs: 1.7 10*3/uL (ref 0.7–4.0)
MCH: 31.3 pg (ref 26.0–34.0)
MCH: 31.1 pg (ref 26.0–34.0)
MCHC: 34.7 g/dL (ref 30.0–36.0)
MCHC: 34 g/dL (ref 30.0–36.0)
MCV: 90.3 fL (ref 80.0–100.0)
MCV: 91.5 fL (ref 80.0–100.0)
Monocytes Absolute: 1.7 10*3/uL — ABNORMAL HIGH (ref 0.1–1.0)
Monocytes Absolute: 1.4 10*3/uL — ABNORMAL HIGH (ref 0.1–1.0)
Monocytes Relative: 7 %
Monocytes Relative: 6 %
Neutro Abs: 19.6 10*3/uL — ABNORMAL HIGH (ref 1.7–7.7)
Neutro Abs: 18.9 10*3/uL — ABNORMAL HIGH (ref 1.7–7.7)
Neutrophils Relative %: 81 %
Neutrophils Relative %: 82 %
Platelets: 243 10*3/uL (ref 150–400)
Platelets: 250 10*3/uL (ref 150–400)
RBC: 3.29 MIL/uL — ABNORMAL LOW (ref 4.22–5.81)
RBC: 3.28 MIL/uL — ABNORMAL LOW (ref 4.22–5.81)
RDW: 19.1 % — ABNORMAL HIGH (ref 11.5–15.5)
RDW: 19.5 % — ABNORMAL HIGH (ref 11.5–15.5)
WBC: 23.7 10*3/uL — ABNORMAL HIGH (ref 4.0–10.5)
WBC: 22.8 10*3/uL — ABNORMAL HIGH (ref 4.0–10.5)
nRBC: 0 % (ref 0.0–0.2)
nRBC: 0 % (ref 0.0–0.2)

## 2020-01-07 LAB — COMPREHENSIVE METABOLIC PANEL
ALT: 26 U/L (ref 0–44)
ALT: 23 U/L (ref 0–44)
AST: 70 U/L — ABNORMAL HIGH (ref 15–41)
AST: 65 U/L — ABNORMAL HIGH (ref 15–41)
Albumin: 1.9 g/dL — ABNORMAL LOW (ref 3.5–5.0)
Albumin: 1.7 g/dL — ABNORMAL LOW (ref 3.5–5.0)
Alkaline Phosphatase: 115 U/L (ref 38–126)
Alkaline Phosphatase: 101 U/L (ref 38–126)
Anion gap: 12 (ref 5–15)
Anion gap: 12 (ref 5–15)
BUN: 11 mg/dL (ref 6–20)
BUN: 10 mg/dL (ref 6–20)
CO2: 28 mmol/L (ref 22–32)
CO2: 26 mmol/L (ref 22–32)
Calcium: 7.9 mg/dL — ABNORMAL LOW (ref 8.9–10.3)
Calcium: 7.6 mg/dL — ABNORMAL LOW (ref 8.9–10.3)
Chloride: 93 mmol/L — ABNORMAL LOW (ref 98–111)
Chloride: 96 mmol/L — ABNORMAL LOW (ref 98–111)
Creatinine, Ser: 0.84 mg/dL (ref 0.61–1.24)
Creatinine, Ser: 0.67 mg/dL (ref 0.61–1.24)
GFR calc Af Amer: >60 mL/min (ref >60)
GFR calc Af Amer: >60 mL/min (ref >60)
GFR calc non Af Amer: >60 mL/min (ref >60)
GFR calc non Af Amer: >60 mL/min (ref >60)
Glucose, Bld: 93 mg/dL (ref 70–99)
Glucose, Bld: 87 mg/dL (ref 70–99)
Potassium: 2.4 mmol/L — CL (ref 3.5–5.1)
Potassium: 3.1 mmol/L — ABNORMAL LOW (ref 3.5–5.1)
Sodium: 133 mmol/L — ABNORMAL LOW (ref 135–145)
Sodium: 134 mmol/L — ABNORMAL LOW (ref 135–145)
Total Bilirubin: 9.3 mg/dL — ABNORMAL HIGH (ref 0.3–1.2)
Total Bilirubin: 8.4 mg/dL — ABNORMAL HIGH (ref 0.3–1.2)
Total Protein: 6.2 g/dL — ABNORMAL LOW (ref 6.5–8.1)
Total Protein: 5.6 g/dL — ABNORMAL LOW (ref 6.5–8.1)

## 2020-01-07 LAB — AMMONIA: Ammonia: 69 umol/L — ABNORMAL HIGH (ref 9–35)

## 2020-01-07 LAB — MAGNESIUM
Magnesium: 1.9 mg/dL (ref 1.7–2.4)
Magnesium: 1.6 mg/dL — ABNORMAL LOW (ref 1.7–2.4)

## 2020-01-07 LAB — BASIC METABOLIC PANEL
Anion gap: 7 (ref 5–15)
BUN: 10 mg/dL (ref 6–20)
CO2: 29 mmol/L (ref 22–32)
Calcium: 7.6 mg/dL — ABNORMAL LOW (ref 8.9–10.3)
Chloride: 97 mmol/L — ABNORMAL LOW (ref 98–111)
Creatinine, Ser: 0.64 mg/dL (ref 0.61–1.24)
GFR calc Af Amer: >60 mL/min (ref >60)
GFR calc non Af Amer: >60 mL/min (ref >60)
Glucose, Bld: 92 mg/dL (ref 70–99)
Potassium: 3.1 mmol/L — ABNORMAL LOW (ref 3.5–5.1)
Sodium: 133 mmol/L — ABNORMAL LOW (ref 135–145)

## 2020-01-07 LAB — RAPID URINE DRUG SCREEN, HOSP PERFORMED
Amphetamines: NOT DETECTED
Barbiturates: NOT DETECTED
Benzodiazepines: NOT DETECTED
Cocaine: NOT DETECTED
Opiates: NOT DETECTED
Tetrahydrocannabinol: POSITIVE — AB

## 2020-01-07 LAB — LACTATE DEHYDROGENASE, PLEURAL OR PERITONEAL FLUID: LD, Fluid: 28 U/L — ABNORMAL HIGH (ref 3–23)

## 2020-01-07 LAB — URINALYSIS, ROUTINE W REFLEX MICROSCOPIC
Bacteria, UA: NONE SEEN
Glucose, UA: NEGATIVE mg/dL
Hgb urine dipstick: NEGATIVE
Ketones, ur: NEGATIVE mg/dL
Leukocytes,Ua: NEGATIVE
Nitrite: NEGATIVE
Protein, ur: 100 mg/dL — AB
Specific Gravity, Urine: 1.02 (ref 1.005–1.030)
pH: 5 (ref 5.0–8.0)

## 2020-01-07 LAB — TROPONIN I (HIGH SENSITIVITY)
Troponin I (High Sensitivity): 3 ng/L (ref <18)
Troponin I (High Sensitivity): 3 ng/L (ref <18)
Troponin I (High Sensitivity): 4 ng/L (ref <18)

## 2020-01-07 LAB — ALBUMIN, PLEURAL OR PERITONEAL FLUID: Albumin, Fluid: <1 g/dL

## 2020-01-07 LAB — ECHOCARDIOGRAM COMPLETE
Height: 72 in
Weight: 2000 oz

## 2020-01-07 LAB — LACTIC ACID, PLASMA
Lactic Acid, Venous: 2.6 mmol/L (ref 0.5–1.9)
Lactic Acid, Venous: 1.2 mmol/L (ref 0.5–1.9)
Lactic Acid, Venous: 1.3 mmol/L (ref 0.5–1.9)

## 2020-01-07 LAB — PROTIME-INR
INR: 2.3 — ABNORMAL HIGH (ref 0.8–1.2)
Prothrombin Time: 25.5 seconds — ABNORMAL HIGH (ref 11.4–15.2)

## 2020-01-07 LAB — GLUCOSE, PLEURAL OR PERITONEAL FLUID: Glucose, Fluid: 101 mg/dL

## 2020-01-07 LAB — PROTEIN, PLEURAL OR PERITONEAL FLUID: Total protein, fluid: <3 g/dL

## 2020-01-07 LAB — PATHOLOGIST SMEAR REVIEW

## 2020-01-07 LAB — SARS CORONAVIRUS 2 (TAT 6-24 HRS): SARS Coronavirus 2: NEGATIVE

## 2020-01-07 LAB — LIPASE, BLOOD: Lipase: 19 U/L (ref 11–51)

## 2020-01-07 LAB — PHOSPHORUS: Phosphorus: 3 mg/dL (ref 2.5–4.6)

## 2020-01-07 LAB — BRAIN NATRIURETIC PEPTIDE: B Natriuretic Peptide: 211.4 pg/mL — ABNORMAL HIGH (ref 0.0–100.0)

## 2020-01-07 LAB — PROCALCITONIN: Procalcitonin: 0.49 ng/mL

## 2020-01-07 LAB — ETHANOL: Alcohol, Ethyl (B): <10 mg/dL (ref <10)

## 2020-01-07 MED ORDER — FOLIC ACID 1 MG PO TABS
1.0000 mg | ORAL_TABLET | Freq: Every day | ORAL | Status: DC
  Administered 2020-01-07 – 2020-01-16 (×9): 1 mg via ORAL
  Filled 2020-01-07 (×10): qty 1

## 2020-01-07 MED ORDER — POTASSIUM CHLORIDE 10 MEQ/100ML IV SOLN
10.0000 meq | Freq: Once | INTRAVENOUS | Status: AC
  Administered 2020-01-07: 20:00:00 10 meq via INTRAVENOUS
  Filled 2020-01-07: qty 100

## 2020-01-07 MED ORDER — POTASSIUM CHLORIDE 10 MEQ/100ML IV SOLN: 10.0000 meq | INTRAVENOUS | Status: DC

## 2020-01-07 MED ORDER — ADULT MULTIVITAMIN W/MINERALS CH
1.0000 | ORAL_TABLET | Freq: Every day | ORAL | Status: DC
  Administered 2020-01-07 – 2020-01-16 (×9): 1 via ORAL
  Filled 2020-01-07 (×11): qty 1

## 2020-01-07 MED ORDER — ALBUMIN HUMAN 5 % IV SOLN
12.5000 g | Freq: Four times a day (QID) | INTRAVENOUS | Status: DC
  Filled 2020-01-07: qty 250

## 2020-01-07 MED ORDER — FENTANYL CITRATE (PF) 100 MCG/2ML IJ SOLN
50.0000 ug | Freq: Once | INTRAMUSCULAR | Status: AC
  Administered 2020-01-07: 50 ug via INTRAVENOUS
  Filled 2020-01-07: qty 2

## 2020-01-07 MED ORDER — THIAMINE HCL 100 MG PO TABS
100.0000 mg | ORAL_TABLET | Freq: Every day | ORAL | Status: DC
  Administered 2020-01-07 – 2020-01-16 (×9): 100 mg via ORAL
  Filled 2020-01-07 (×10): qty 1

## 2020-01-07 MED ORDER — NICOTINE 21 MG/24HR TD PT24
21.0000 mg | MEDICATED_PATCH | Freq: Every day | TRANSDERMAL | Status: DC
  Administered 2020-01-07 – 2020-01-16 (×10): 21 mg via TRANSDERMAL
  Filled 2020-01-07 (×10): qty 1

## 2020-01-07 MED ORDER — SODIUM CHLORIDE 0.9 % IV SOLN
2.0000 g | Freq: Once | INTRAVENOUS | Status: AC
  Administered 2020-01-07: 2 g via INTRAVENOUS
  Filled 2020-01-07: qty 20

## 2020-01-07 MED ORDER — SODIUM CHLORIDE 0.9 % IV SOLN: Freq: Once | INTRAVENOUS | Status: AC

## 2020-01-07 MED ORDER — FENTANYL CITRATE (PF) 100 MCG/2ML IJ SOLN
50.0000 ug | Freq: Once | INTRAMUSCULAR | Status: AC
  Administered 2020-01-07: 02:00:00 50 ug via INTRAVENOUS
  Filled 2020-01-07: qty 2

## 2020-01-07 MED ORDER — FENTANYL CITRATE (PF) 100 MCG/2ML IJ SOLN
25.0000 ug | INTRAMUSCULAR | Status: DC | PRN
  Administered 2020-01-07 (×2): 50 ug via INTRAVENOUS
  Filled 2020-01-07 (×2): qty 2

## 2020-01-07 MED ORDER — POTASSIUM CHLORIDE CRYS ER 20 MEQ PO TBCR
40.0000 meq | EXTENDED_RELEASE_TABLET | Freq: Once | ORAL | Status: DC
  Filled 2020-01-07: qty 2

## 2020-01-07 MED ORDER — SODIUM CHLORIDE 0.9% FLUSH
3.0000 mL | Freq: Two times a day (BID) | INTRAVENOUS | Status: DC
  Administered 2020-01-07 – 2020-01-15 (×9): 3 mL via INTRAVENOUS

## 2020-01-07 MED ORDER — PANTOPRAZOLE SODIUM 40 MG PO TBEC
40.0000 mg | DELAYED_RELEASE_TABLET | Freq: Two times a day (BID) | ORAL | Status: DC
  Administered 2020-01-07 – 2020-01-16 (×18): 40 mg via ORAL
  Filled 2020-01-07 (×20): qty 1

## 2020-01-07 MED ORDER — ALBUMIN HUMAN 5 % IV SOLN
12.5000 g | Freq: Four times a day (QID) | INTRAVENOUS | Status: AC
  Administered 2020-01-07: 13:00:00 12.5 g via INTRAVENOUS
  Filled 2020-01-07 (×3): qty 250

## 2020-01-07 MED ORDER — ONDANSETRON HCL 4 MG/2ML IJ SOLN
4.0000 mg | Freq: Once | INTRAMUSCULAR | Status: AC
  Administered 2020-01-07: 4 mg via INTRAVENOUS
  Filled 2020-01-07: qty 2

## 2020-01-07 MED ORDER — ALBUMIN HUMAN 25 % IV SOLN
25.0000 g | Freq: Once | INTRAVENOUS | Status: AC
  Administered 2020-01-08: 25 g via INTRAVENOUS
  Filled 2020-01-07: qty 100

## 2020-01-07 MED ORDER — IVABRADINE HCL 5 MG PO TABS
7.5000 mg | ORAL_TABLET | Freq: Once | ORAL | Status: AC
  Administered 2020-01-08: 7.5 mg via ORAL
  Filled 2020-01-07: qty 2

## 2020-01-07 MED ORDER — POTASSIUM CHLORIDE 10 MEQ/100ML IV SOLN
10.0000 meq | INTRAVENOUS | Status: AC
  Administered 2020-01-07 (×2): 10 meq via INTRAVENOUS
  Filled 2020-01-07 (×2): qty 100

## 2020-01-07 MED ORDER — SODIUM CHLORIDE 0.9% FLUSH
3.0000 mL | INTRAVENOUS | Status: DC | PRN
  Administered 2020-01-13: 3 mL via INTRAVENOUS

## 2020-01-07 MED ORDER — ENOXAPARIN SODIUM 40 MG/0.4ML ~~LOC~~ SOLN
40.0000 mg | SUBCUTANEOUS | Status: DC
  Administered 2020-01-07: 40 mg via SUBCUTANEOUS
  Filled 2020-01-07: qty 0.4

## 2020-01-07 MED ORDER — SODIUM CHLORIDE 0.9 % IV SOLN
2.0000 g | INTRAVENOUS | Status: DC
  Administered 2020-01-07 – 2020-01-11 (×5): 2 g via INTRAVENOUS
  Filled 2020-01-07: qty 20
  Filled 2020-01-07 (×5): qty 2

## 2020-01-07 MED ORDER — POTASSIUM CHLORIDE 10 MEQ/100ML IV SOLN
10.0000 meq | INTRAVENOUS | Status: AC
  Administered 2020-01-07 (×5): 10 meq via INTRAVENOUS
  Filled 2020-01-07 (×5): qty 100

## 2020-01-07 MED ORDER — SODIUM CHLORIDE 0.9 % IV SOLN: 250.0000 mL | INTRAVENOUS | Status: DC | PRN

## 2020-01-07 MED ORDER — FENTANYL CITRATE (PF) 100 MCG/2ML IJ SOLN
25.0000 ug | Freq: Once | INTRAMUSCULAR | Status: AC
  Administered 2020-01-07: 25 ug via INTRAVENOUS
  Filled 2020-01-07: qty 2

## 2020-01-07 MED ORDER — FENTANYL CITRATE (PF) 100 MCG/2ML IJ SOLN
25.0000 ug | INTRAMUSCULAR | Status: DC
  Administered 2020-01-08 – 2020-01-11 (×19): 25 ug via INTRAVENOUS
  Filled 2020-01-07 (×19): qty 2

## 2020-01-07 MED ORDER — SODIUM CHLORIDE 0.9% FLUSH
3.0000 mL | Freq: Two times a day (BID) | INTRAVENOUS | Status: DC
  Administered 2020-01-07 – 2020-01-13 (×10): 3 mL via INTRAVENOUS

## 2020-01-07 NOTE — ED Provider Notes (Signed)
[facility] DEPT Provider Note   CSN: [id number] Arrival date & time: 01/06/20  2320     History Chief Complaint  Patient presents with  . Abdominal Pain    Richard [name] is a 54 y.o. male.  Patient with history of colon CA s/p colectomy, alcoholism, ascites, pancreatitis, PUD, HTN presents with abdominal pain and swelling x 3 weeks since discharge from most recent hospitalization. No vomiting. He states he cannot eat and drink anything secondary to pain. No fever. He reports last BM was one week ago. He is also having increased LE edema. No SOB or chest pain.   The history is provided by the patient. No language interpreter was used.  Abdominal Pain Associated symptoms: no chest pain, no chills, no fever and no shortness of breath        Past Medical History:  Diagnosis Date  . Acid reflux   . Colon cancer ([place])   . ETOH abuse   . HTN (hypertension)   . Pancreatitis 08/2018  . PUD (peptic ulcer disease)     Patient Active Problem List   Diagnosis Date Noted  . Polyp of ascending colon   . History of colon cancer   . Ascites 12/13/2019  . SVT (supraventricular tachycardia) ([place])   . Hypomagnesemia   . Hypophosphatemia   . High anion gap metabolic acidosis [id number]  . Acute pancreatitis 08/14/2018  . Smoker 08/14/2018  . 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 HTN (hypertension) 08/14/2018  . Abdominal pain 06/27/2018  . Hypertensive urgency 06/27/2018  . Elevated troponin 06/27/2018  . Hypoglycemia 06/27/2018  . Protein-calorie malnutrition, severe 05/02/2018  . Pancreatitis 05/01/2018  . Heme positive stool 11/13/2017  . Alcoholic ketoacidosis [id number]  . Hepatic steatosis 08/21/2016  . Thrombocytopenia ([place]) 08/21/2016  . Alcohol abuse 08/21/2016  . Alcohol withdrawal delirium ([place]) 08/20/2016  . Dehydration 08/20/2016  . Intractable nausea and vomiting 08/20/2016  . Lactic acidosis 08/20/2016  . Aspiration pneumonia ([place])  08/20/2016  . Sepsis ([place]) 08/20/2016    Past Surgical History:  Procedure Laterality Date  . BIOPSY  12/14/2019   Procedure: BIOPSY;  Surgeon: Mauri Pole, MD;  Location: [facility] ENDOSCOPY;  Service: Endoscopy;;  . COLONOSCOPY WITH PROPOFOL N/A 12/14/2019   Procedure: COLONOSCOPY WITH PROPOFOL;  Surgeon: Mauri Pole, MD;  Location: [facility] ENDOSCOPY;  Service: Endoscopy;  Laterality: N/A;  . ESOPHAGOGASTRODUODENOSCOPY (EGD) WITH PROPOFOL N/A 12/14/2019   Procedure: ESOPHAGOGASTRODUODENOSCOPY (EGD) WITH PROPOFOL;  Surgeon: Mauri Pole, MD;  Location: [facility] END[facility];  Service: Endoscopy;  Laterality: N/A;  . LAPAROSCOPIC SIGMOID COLECTOMY  2007  . POLYPECTOMY  12/14/2019   Procedure: POLYPECTOMY;  Surgeon: Mauri Pole, MD;  Location: [facility]OSCOPY;  Service: Endoscopy;;       Family History  Problem Relation Age of Onset  . Colon cancer Father   . Cancer Sister   . CAD Neg Hx   . Stroke Neg Hx   . Diabetes Neg Hx     Social History   Tobacco Use  . Smoking status: Current Every Day Smoker    Packs/day: 1.00  . Smokeless tobacco: Never Used  Substance Use Topics  . Alcohol use: Yes    Alcohol/week: 2.0 standard drinks    Types: 1 Cans of beer, 1 Shots of liquor per week    Comment: heavy alcohol abuse a beer and couple of shots a day for past  14 years  . Drug use: No    Home Medications Prior to Admission  medications   Medication Sig Start Date End Date Taking? Authorizing Provider  acetaminophen (TYLENOL) 500 MG tablet Take 1 tablet (500 mg total) by mouth every 6 (six) hours as needed for mild pain, moderate pain or headache. 12/14/19   Hongalgi, Lenis Dickinson, MD  folic acid (FOLVITE) 1 MG tablet Take 1 tablet (1 mg total) by mouth daily. 12/14/19   Hongalgi, Lenis Dickinson, MD  Multiple Vitamin (MULTIVITAMIN WITH MINERALS) TABS tablet Take 1 tablet by mouth daily. 12/15/19   Hongalgi, Lenis Dickinson, MD  nicotine (NICODERM CQ - DOSED IN MG/24 HOURS) 21 mg/24hr patch Place 1  patch (21 mg total) onto the skin daily. 12/15/19   Hongalgi, Lenis Dickinson, MD  pantoprazole (PROTONIX) 40 MG tablet Take 1 tablet (40 mg total) by mouth 2 (two) times daily before a meal. 12/14/19   Hongalgi, Lenis Dickinson, MD  thiamine 100 MG tablet Take 1 tablet (100 mg total) by mouth daily. 12/14/19   Hongalgi, Lenis Dickinson, MD    Allergies    Aspirin and Penicillins  Review of Systems   Review of Systems  Constitutional: Positive for activity change and appetite change. Negative for chills and fever.  HENT: Negative.   Respiratory: Negative.  Negative for shortness of breath.   Cardiovascular: Negative.  Negative for chest pain.  Gastrointestinal: Positive for abdominal distention and abdominal pain.  Musculoskeletal: Negative.   Skin: Negative.   Neurological: Positive for weakness.    Physical Exam Updated Vital Signs BP 108/70 (BP Location: Left Arm)   Pulse 90   Temp 98 F (36.7 C) (Oral)   Resp 16   Ht 6' (1.829 m)   Wt 56.7 kg   SpO2 93%   BMI 16.95 kg/m   Physical Exam Vitals and nursing note reviewed.  Constitutional:      Appearance: He is well-developed. He is ill-appearing.     Comments: Cachectic, malnourished in appearance.   HENT:     Head: Normocephalic.  Cardiovascular:     Rate and Rhythm: Normal rate and regular rhythm.     Heart sounds: No murmur.  Pulmonary:     Effort: Pulmonary effort is normal.     Breath sounds: Normal breath sounds. No wheezing, rhonchi or rales.  Abdominal:     General: Abdomen is protuberant. There is distension.     Palpations: Abdomen is soft.     Tenderness: There is generalized abdominal tenderness. There is no guarding or rebound.  Genitourinary:    Testes:        Right: Swelling present.        Left: Swelling present.  Musculoskeletal:        General: Normal range of motion.     Cervical back: Normal range of motion and neck supple.  Skin:    General: Skin is warm and dry.     Findings: No rash.  Neurological:      Mental Status: He is alert and oriented to person, place, and time.     ED Results / Procedures / Treatments   Labs (all labs ordered are listed, but only abnormal results are displayed) Labs Reviewed  CBC WITH DIFFERENTIAL/PLATELET  LIPASE, BLOOD  COMPREHENSIVE METABOLIC PANEL   Results for orders placed or performed during the hospital encounter of 01/06/20  Body fluid culture   Specimen: Peritoneal Cavity; Peritoneal Fluid  Result Value Ref Range   Specimen Description PERITONEAL CAVITY    Special Requests NONE    Gram Stain  WBC PRESENT,BOTH PMN AND MONONUCLEAR NO ORGANISMS SEEN Gram Stain Report Called to,Read Back By and Verified WithMee Hives @ [phone] 01/07/2020 Performed at [facility], [place] [address] [place], [place] [address]    Culture PENDING    Report Status PENDING   CBC with Differential  Result Value Ref Range   WBC 23.7 (H) 4.0 - 10.5 K/uL   RBC 3.29 (L) 4.22 - 5.81 MIL/uL   Hemoglobin 10.3 (L) 13.0 - 17.0 g/dL   HCT 29.7 (L) 39.0 - 52.0 %   MCV 90.3 80.0 - 100.0 fL   MCH 31.3 26.0 - 34.0 pg   MCHC 34.7 30.0 - 36.0 g/dL   RDW 19.1 (H) 11.5 - 15.5 %   Platelets 243 150 - 400 K/uL   nRBC 0.0 0.0 - 0.2 %   Neutrophils Relative % 81 %   Neutro Abs 19.6 (H) 1.7 - 7.7 K/uL   Lymphocytes Relative 7 %   Lymphs Abs 1.6 0.7 - 4.0 K/uL   Monocytes Relative 7 %   Monocytes Absolute 1.7 (H) 0.1 - 1.0 K/uL   Eosinophils Relative 3 %   Eosinophils Absolute 0.6 (H) 0.0 - 0.5 K/uL   Basophils Relative 1 %   Basophils Absolute 0.1 0.0 - 0.1 K/uL   Immature Granulocytes 1 %   Abs Immature Granulocytes 0.16 (H) 0.00 - 0.07 K/uL  Lipase, blood  Result Value Ref Range   Lipase 19 11 - 51 U/L  Comprehensive metabolic panel  Result Value Ref Range   Sodium 133 (L) 135 - 145 mmol/L   Potassium 2.4 (LL) 3.5 - 5.1 mmol/L   Chloride 93 (L) 98 - 111 mmol/L   CO2 28 22 - 32 mmol/L   Glucose, Bld 93 70 - 99 mg/dL   BUN 11 6 - 20 mg/dL    Creatinine, Ser 0.84 0.61 - 1.24 mg/dL   Calcium 7.9 (L) 8.9 - 10.3 mg/dL   Total Protein 6.2 (L) 6.5 - 8.1 g/dL   Albumin 1.9 (L) 3.5 - 5.0 g/dL   AST 70 (H) 15 - 41 U/L   ALT 26 0 - 44 U/L   Alkaline Phosphatase 115 38 - 126 U/L   Total Bilirubin 9.3 (H) 0.3 - 1.2 mg/dL   GFR calc non Af Amer >60 >60 mL/min   GFR calc Af Amer >60 >60 mL/min   Anion gap 12 5 - 15  Ammonia  Result Value Ref Range   Ammonia 69 (H) 9 - 35 umol/L  Urinalysis, Routine w reflex microscopic  Result Value Ref Range   Color, Urine AMBER (A) YELLOW   APPearance HAZY (A) CLEAR   Specific Gravity, Urine 1.020 1.005 - 1.030   pH 5.0 5.0 - 8.0   Glucose, UA NEGATIVE NEGATIVE mg/dL   Hgb urine dipstick NEGATIVE NEGATIVE   Bilirubin Urine MODERATE (A) NEGATIVE   Ketones, ur NEGATIVE NEGATIVE mg/dL   Protein, ur 100 (A) NEGATIVE mg/dL   Nitrite NEGATIVE NEGATIVE   Leukocytes,Ua NEGATIVE NEGATIVE   RBC / HPF 0-5 0 - 5 RBC/hpf   WBC, UA 11-20 0 - 5 WBC/hpf   Bacteria, UA NONE SEEN NONE SEEN   Squamous Epithelial / LPF 0-5 0 - 5   Mucus PRESENT    Hyaline Casts, UA PRESENT   Ethanol  Result Value Ref Range   Alcohol, Ethyl (B) <10 <10 mg/dL  Lactic acid, plasma  Result Value Ref Range   Lactic Acid, Venous 2.6 (HH) 0.5 -  1.9 mmol/L  Magnesium  Result Value Ref Range   Magnesium 1.9 1.7 - 2.4 mg/dL  Protime-INR  Result Value Ref Range   Prothrombin Time 25.5 (H) 11.4 - 15.2 seconds   INR 2.3 (H) 0.8 - 1.2  Lactate dehydrogenase (pleural or peritoneal fluid)  Result Value Ref Range   LD, Fluid 28 (H) 3 - 23 U/L   Fluid Type-FLDH PERITONEAL CAVITY   Glucose, pleural or peritoneal fluid  Result Value Ref Range   Glucose, Fluid 101 mg/dL   Fluid Type-FGLU PERITONEAL CAVITY   Protein, pleural or peritoneal fluid  Result Value Ref Range   Total protein, fluid <3.0 g/dL   Fluid Type-FTP PERITONEAL CAVITY   Albumin, pleural or peritoneal fluid  Result Value Ref Range   Albumin, Fluid <1.0 g/dL    Fluid Type-FALB PERITONEAL CAVITY   Body fluid cell count with differential  Result Value Ref Range   Fluid Type-FCT Peritoneal    Color, Fluid YELLOW YELLOW   Appearance, Fluid CLEAR CLEAR   Total Nucleated Cell Count, Fluid 124 0 - 1,000 cu mm   Neutrophil Count, Fluid 22 0 - 25 %   Lymphs, Fluid 32 %   Monocyte-Macrophage-Serous Fluid 46 (L) 50 - 90 %   Eos, Fluid 0 %   Other Cells, Fluid OTHER CELLS IDENTIFIED AS MESOTHELIAL CELLS %     EKG None  Radiology No results found.  Procedures Procedures (including critical care time) CRITICAL CARE Performed by: Dewaine Oats   Total critical care time: 45 minutes  Critical care time was exclusive of separately billable procedures and treating other patients.  Critical care was necessary to treat or prevent imminent or life-threatening deterioration.  Critical care was time spent personally by me on the following activities: development of treatment plan with patient and/or surrogate as well as nursing, discussions with consultants, evaluation of patient's response to treatment, examination of patient, obtaining history from patient or surrogate, ordering and performing treatments and interventions, ordering and review of laboratory studies, ordering and review of radiographic studies, pulse oximetry and re-evaluation of patient's condition.  Medications Ordered in ED Medications - No data to display  ED Course  I have reviewed the triage vital signs and the nursing notes.  Pertinent labs & imaging results that were available during my care of the patient were reviewed by me and considered in my medical decision making (see chart for details).    MDM Rules/Calculators/A&P                      Patient to ED with continuous abdominal pain and nausea since discharge from most recent admission for same. He reports no alcohol use since that time.   Per chart review, the patient was seen by IR for possible paracentesis but  was found to have to little fluid to drain. SBP was considered, abx started but later discontinued as it became an unlikely diagnosis. Given distended abdomen, likely worsening ascites, SBP considered in the DDx and Rocephin started in the ED.   Bedside US by Dr. Leonette Monarch confirms ascites with collection of fluid felt obtainable for testing. Procedure performed by Dr. Leonette Monarch. Labs pending.   His potassium is significantly low. IV supplementation started. VSS. There is a significant leukocytosis of 23.7, elevated lactic acid of 2.6, mildly elevated ammonia, worsening PT/PTT. He will required admission for further care.   Discussed with Dr. Myna Hidalgo, [facility], who accepts the patient for admission.   Final Clinical Impression(s) /  ED Diagnoses Final diagnoses:  None   1. Hypokalemia 2. Ascites 3. Leukocytosis 4. Abdominal pain  Rx / DC Orders ED Discharge Orders    None       Charlann Lange, PA-C 01/07/20 [place], [place], MD 01/08/20 [phone]

## 2020-01-07 NOTE — ED Provider Notes (Signed)
Attestation: Medical screening examination/treatment/procedure(s) were conducted as a shared visit with non-physician practitioner(s) and myself.  I personally evaluated the patient during the encounter.   Briefly, the patient is a 54 y.o. male with h/o colon CA s/p colectomy, alcoholism, ascites, pancreatitis, PUD, HTN presents with abdominal pain and swelling x 3 weeks since discharge from most recent hospitalization.  Vitals:   01/07/20 0300 01/07/20 0400  BP: 107/66 98/66  Pulse: 76 77  Resp: 17 20  Temp:    SpO2: 93% 97%    CONSTITUTIONAL:  malnourished-appearing, NAD NEURO:  Alert and oriented x 3, no focal deficits EYES:  pupils equal and reactive ENT/NECK:  trachea midline, no JVD CARDIO:  reg rate, reg rhythm, well-perfused PULM:  None labored breathing GI/GU:  Abdomen distended, diffuse tenderness MSK/SPINE:  No gross deformities, no edema SKIN:  no rash, atraumatic PSYCH:  Appropriate speech and behavior   EKG Interpretation  Date/Time:  Tuesday January 07 2020 03:21:31 EDT Ventricular Rate:  79 PR Interval:    QRS Duration: 87 QT Interval:  451 QTC Calculation: 518 R Axis:   68 Text Interpretation: Sinus rhythm Anterior infarct, old Prolonged QT interval Baseline wander in lead(s) V3 No acute changes Confirmed by Addison Lank [phone]) on 01/07/2020 3:29:08 AM      .Paracentesis  Date/Time: 01/07/2020 5:54 AM Performed by: Fatima Blank, MD Authorized by: Fatima Blank, MD   Consent:    Consent obtained:  Verbal   Consent given by:  Patient   Risks discussed:  Bowel perforation, infection and pain   Alternatives discussed:  Delayed treatment Pre-procedure details:    Procedure purpose:  Diagnostic   Preparation: Patient was prepped and draped in usual sterile fashion   Anesthesia (see MAR for exact dosages):    Anesthesia method:  Local infiltration   Local anesthetic:  Lidocaine 1% w/o epi Procedure details:    Needle gauge:  18  Ultrasound guidance: yes     Puncture site:  R lower quadrant   Fluid removed amount:  30cc   Fluid appearance:  Yellow and clear   Dressing:  Adhesive bandage Post-procedure details:    Patient tolerance of procedure:  Tolerated well, no immediate complications  Ultrasound ED Abd  Date/Time: 01/08/2020 4:54 AM Performed by: Fatima Blank, MD Authorized by: Fatima Blank, MD   Procedure details:    Indications: abdominal pain     Assessment for:  Intra-abdominal fluid   Images: archived    Comments:     Moderate ascites .Critical Care Performed by: Fatima Blank, MD Authorized by: Fatima Blank, MD    CRITICAL CARE Performed by: Grayce Sessions Dallen Bunte Total critical care time: 10 minutes Critical care time was exclusive of separately billable procedures and treating other patients. Critical care was necessary to treat or prevent imminent or life-threatening deterioration. Critical care was time spent personally by me on the following activities: development of treatment plan with patient and/or surrogate as well as nursing, discussions with consultants, evaluation of patient's response to treatment, examination of patient, obtaining history from patient or surrogate, ordering and performing treatments and interventions, ordering and review of laboratory studies, ordering and review of radiographic studies, pulse oximetry and re-evaluation of patient's condition.    Treated for possible SBP and admitted for further w/u and management.       Fatima Blank, MD 01/08/20 [phone]

## 2020-01-07 NOTE — Consult Note (Signed)
Cardiology Consultation:   Patient ID: Richard [name] MRN: [id number]; DOB: 03-13-1966  Admit date: 01/06/2020 Date of Consult: 01/07/2020  Primary Care Provider: Antony Blackbird, MD Primary Cardiologist: Jenkins Rouge, MD  Primary Electrophysiologist:  None    Patient Profile:   Richard [name] is a 54 y.o. male with a hx of alcohol abuse and withdrawal, colon cancer status post surgery 2007 without follow-up, PUD, GERD, alcoholic pancreatitis, chronic abdominal pain, hypertension, SVT, chronic systolic heart failure (EF 30-35% in 11/2018) who is being seen today for the evaluation of heart failure and prolonged Qt at the request of Dr. Sherral Hammers.  History of Present Illness:   Richard [name] recently established care with Dr. Johnsie Cancel.  In February 2020 cardiology was consulted for SVT in the setting of alcohol withdrawal.  IV metoprolol was given and heart rates improved. Had plan to give adenosine but rhythm converted to sinus tachycardia.  Echo showed EF of 30 to 35%, thought to be nonischemic from alcohol abuse, tachycardia as well as poorly controlled hypertension. Troponin negative x4. He was started on Coreg and losartan.  And for for outpatient ischemic work-up.  Does not appear patient followed up.  Patient presented to the ED for 01/07/20 for abdominal pain and swelling for the last 3 weeks. He says since he was last discharged he has had progressive swelling. Patient reported no alcohol use since his last discharge. Patient does report intermittent chest pain worse when his abdomen is full. Chest pain is left sided and radiates down the arm. It is a tightness and occurs about once a week.   The ED blood pressure 108/70, pulse 90, afebrile, respiratory rate 16, 93% O2.  Labs showed potassium 2.4, creatinine 0.67, magnesium 1.6, albumin 1.7, AST 70, ALT 26, total bili 9.3.  WBC 23,700, hemoglobin 10.2.  Lactic acid 2.6, mildly elevated ammonia.  HS troponin 3.  Bedside ultrasound performed which  confirmed a ascites with collection of fluid patient underwent procedure to obtain ascitic fluid.  She was given Rocephin, pain meds, Zofran, potassium in the ED.  UDS positive for THC. Ethanol wnl.  Patient was admitted for further work-up.    Past Medical History:  Diagnosis Date   Acid reflux    Colon cancer ([place])    ETOH abuse    HTN (hypertension)    Pancreatitis 08/2018   PUD (peptic ulcer disease)     Past Surgical History:  Procedure Laterality Date   BIOPSY  12/14/2019   Procedure: BIOPSY;  Surgeon: Mauri Pole, MD;  Location: [facility];  Service: Endoscopy;;   COLONOSCOPY WITH PROPOFOL N/A 12/14/2019   Procedure: COLONOSCOPY WITH PROPOFOL;  Surgeon: Mauri Pole, MD;  Location: [facility];  Service: Endoscopy;  Laterality: N/A;   ESOPHAGOGASTRODUODENOSCOPY (EGD) WITH PROPOFOL N/A 12/14/2019   Procedure: ESOPHAGOGASTRODUODENOSCOPY (EGD) WITH PROPOFOL;  Surgeon: Mauri Pole, MD;  Location: [facility];  Service: Endoscopy;  Laterality: N/A;   LAPAROSCOPIC SIGMOID COLECTOMY  2007   POLYPECTOMY  12/14/2019   Procedure: POLYPECTOMY;  Surgeon: Mauri Pole, MD;  Location: [facility];  Service: Endoscopy;;     Home Medications:  Prior to Admission medications   Medication Sig Start Date End Date Taking? Authorizing Provider  acetaminophen (TYLENOL) 500 MG tablet Take 1 tablet (500 mg total) by mouth every 6 (six) hours as needed for mild pain, moderate pain or headache. 12/14/19  Yes Hongalgi, Lenis Dickinson, MD  folic acid (FOLVITE) 1 MG tablet Take 1 tablet (1 mg total) by  mouth daily. 12/14/19  Yes Hongalgi, Lenis Dickinson, MD  Multiple Vitamin (MULTIVITAMIN WITH MINERALS) TABS tablet Take 1 tablet by mouth daily. 12/15/19  Yes Hongalgi, Lenis Dickinson, MD  pantoprazole (PROTONIX) 40 MG tablet Take 1 tablet (40 mg total) by mouth 2 (two) times daily before a meal. 12/14/19  Yes Hongalgi, Lenis Dickinson, MD  thiamine 100 MG tablet Take 1 tablet (100 mg total) by  mouth daily. 12/14/19  Yes Hongalgi, Lenis Dickinson, MD  nicotine (NICODERM CQ - DOSED IN MG/24 HOURS) 21 mg/24hr patch Place 1 patch (21 mg total) onto the skin daily. 12/15/19   Modena Jansky, MD    Inpatient Medications: Scheduled Meds:  enoxaparin (LOVENOX) injection  40 mg Subcutaneous [id number]   folic acid  1 mg Oral Daily   multivitamin with minerals  1 tablet Oral Daily   nicotine  21 mg Transdermal Daily   pantoprazole  40 mg Oral BID AC   potassium chloride  40 mEq Oral Once   sodium chloride flush  3 mL Intravenous Q12H   sodium chloride flush  3 mL Intravenous Q12H   thiamine  100 mg Oral Daily   Continuous Infusions:  sodium chloride     albumin human     potassium chloride     potassium chloride 10 mEq (01/07/20 1225)   PRN Meds: sodium chloride, fentaNYL (SUBLIMAZE) injection, sodium chloride flush  Allergies:    Allergies  Allergen Reactions   Aspirin Other (See Comments)    Acid reflux    Penicillins Hives    Has patient had a PCN reaction causing immediate rash, facial/tongue/throat swelling, SOB or lightheadedness with hypotension: yes Has patient had a PCN reaction causing severe rash involving mucus membranes or skin necrosis: no Has patient had a PCN reaction that required hospitalization: no Has patient had a PCN reaction occurring within the last 10 years: no If all of the above answers are "NO", then may proceed with Cephalosporin use.     Social History:   Social History   Socioeconomic History   Marital status: Single    Spouse name: Not on file   Number of children: Not on file   Years of education: Not on file   Highest education level: Not on file  Occupational History   Not on file  Tobacco Use   Smoking status: Current Every Day Smoker    Packs/day: 1.00   Smokeless tobacco: Never Used  Substance and Sexual Activity   Alcohol use: Yes    Alcohol/week: 2.0 standard drinks    Types: 1 Cans of beer, 1 Shots of liquor  per week    Comment: heavy alcohol abuse a beer and couple of shots a day for past  14 years   Drug use: No   Sexual activity: Not Currently  Other Topics Concern   Not on file  Social History Narrative   Not on file   Social Determinants of Health   Financial Resource Strain:    Difficulty of Paying Living Expenses:   Food Insecurity:    Worried About Charity fundraiser in the Last Year:    Arboriculturist in the Last Year:   Transportation Needs:    Film/video editor (Medical):    Lack of Transportation (Non-Medical):   Physical Activity:    Days of Exercise per Week:    Minutes of Exercise per Session:   Stress:    Feeling of Stress :   Social Connections:  Frequency of Communication with Friends and Family:    Frequency of Social Gatherings with Friends and Family:    Attends Religious Services:    Active Member of Clubs or Organizations:    Attends Music therapist:    Marital Status:   Intimate Partner Violence:    Fear of Current or Ex-Partner:    Emotionally Abused:    Physically Abused:    Sexually Abused:     Family History:   Family History  Problem Relation Age of Onset   Colon cancer Father    Cancer Sister    CAD Neg Hx    Stroke Neg Hx    Diabetes Neg Hx      ROS:  Please see the history of present illness.  All other ROS reviewed and negative.     Physical Exam/Data:   Vitals:   01/07/20 0400 01/07/20 0602 01/07/20 0621 01/07/20 1019  BP: 98/66 102/67 95/68 99/64   Pulse: 77 73 72 78  Resp: 20 20 16 18   Temp:   97.7 F (36.5 C) 97.7 F (36.5 C)  TempSrc:      SpO2: 97% 92% 98% 96%  Weight:      Height:        Intake/Output Summary (Last 24 hours) at 01/07/2020 1227 Last data filed at 01/07/2020 1123 Gross per 24 hour  Intake 200 ml  Output 175 ml  Net 25 ml   Last 3 Weights 01/06/2020 12/14/2019 12/14/2019  Weight (lbs) 125 lb 117 lb 4.6 oz 117 lb 4.6 oz  Weight (kg) 56.7 kg 53.2 kg 53.2  kg     Body mass index is 16.95 kg/m.  General:  Well nourished, well developed, in no acute distress HEENT: normal Lymph: no adenopathy Neck: no JVD Endocrine:  No thryomegaly Vascular: No carotid bruits; FA pulses 2+ bilaterally without bruits  Cardiac:  normal S1, S2; RRR; no murmur  Lungs:  clear to auscultation bilaterally, no wheezing, rhonchi or rales  Abd: firm, distended Ext: 1+ edema Musculoskeletal:  No deformities, BUE and BLE strength normal and equal Skin: warm and dry  Neuro:  CNs 2-12 intact, no focal abnormalities noted Psych:  Normal affect   EKG:  The EKG was personally reviewed and demonstrates:  NSR, 79 bpm, nonspecific T wave changes diffuse, QTc 518 ms Telemetry:  Telemetry was personally reviewed and demonstrates:  NSR HR 70-80s  Relevant CV Studies:  Echo 11/2018 1. The left ventricle has moderate-severely reduced systolic function of  99991111. The cavity size was normal. There is no increased left ventricular  wall thickness. Echo evidence of impaired diastolic relaxation Left  ventrical global hypokinesis without  regional wall motion abnormalities.  2. The right ventricle has mildly reduced systolic function. The cavity  was mildly enlarged. There is no increase in right ventricular wall  thickness.  3. The mitral valve is normal in structure. No evidence of mitral valve  stenosis. No significant mitral regurgitation.  4. The tricuspid valve is normal in structure.  5. The aortic valve is tricuspid. No aortic stenosis.  6. The aortic root and ascending aorta are normal in size and structure.  7. The inferior vena cava is normal in size with greater than 50%  respiratory variability.  8. No complete TR doppler jet so unable to estimate PA systolic pressure.   Laboratory Data:  High Sensitivity Troponin:   Recent Labs  Lab 12/13/19 0001 12/13/19 0205 01/07/20 0946  TROPONINIHS 5 6 3  Chemistry Recent Labs  Lab 01/07/20 0129  01/07/20 0944 01/07/20 0946  NA 133* 134* 133*  K 2.4* 3.1* 3.1*  CL 93* 96* 97*  CO2 28 26 29   GLUCOSE 93 87 92  BUN 11 10 10   CREATININE 0.84 0.67 0.64  CALCIUM 7.9* 7.6* 7.6*  GFRNONAA >60 >60 >60  GFRAA >60 >60 >60  ANIONGAP 12 12 7     Recent Labs  Lab 01/07/20 0129 01/07/20 0944  PROT 6.2* 5.6*  ALBUMIN 1.9* 1.7*  AST 70* 65*  ALT 26 23  ALKPHOS 115 101  BILITOT 9.3* 8.4*   Hematology Recent Labs  Lab 01/07/20 0129 01/07/20 0944  WBC 23.7* 22.8*  RBC 3.29* 3.28*  HGB 10.3* 10.2*  HCT 29.7* 30.0*  MCV 90.3 91.5  MCH 31.3 31.1  MCHC 34.7 34.0  RDW 19.1* 19.5*  PLT 243 250   BNPNo results for input(s): BNP, PROBNP in the last 168 hours.  DDimer No results for input(s): DDIMER in the last 168 hours.   Radiology/Studies:  DG Abdomen Acute W/Chest  Result Date: 01/07/2020 CLINICAL DATA:  Abdominal pain. EXAM: DG ABDOMEN ACUTE W/ 1V CHEST COMPARISON:  CT dated December 13, 2019 FINDINGS: The bowel gas pattern is nonspecific. There are no air-fluid levels. The bowel appears to be centrally located which is suggestive of underlying ascites. There is hepatosplenomegaly. There are heterogeneous airspace opacities at the lung bases bilaterally which appear to have worsened since the prior CT. There is no pneumothorax. The heart size is stable. Aortic calcifications are noted. IMPRESSION: 1. Nonspecific and nonobstructive bowel gas pattern. 2. Hepatosplenomegaly with probable ascites. 3. Worsening bibasilar airspace opacities which may represent atelectasis, pneumonia, or aspiration. Electronically Signed   By: Constance Holster M.D.   On: 01/07/2020 01:23   {  Assessment and Plan:   Alcoholic hepatitis/history of alcohol use Patient presented with abdominal pain and distention the last 3 to 4 weeks.  Also reported progressive bilateral lower leg edema.  Patient denies alcohol use since he was discharged 3 to 4 weeks ago. -LFTs mildly elevated with leukocytosis - INR  2.3 - Tx per IM  Chronic systolic and diastolic Heart Failure/Cardiomyopathy - EF 30-35%, no RWMA, impaired relaxation with global hypokinesis, mildly reduced RV function - expected CM secondary to alcohol use/tachycardia/untreated HTN - patient was seen 11/2018 for SVT and started on losartan and Coreg. Does not appear to be on his home med list.  - Imaging showed worsening bibasilar airspace possible atelectatics, PNA, or aspiration. Seems to be worse than prior CT - check BNP - lower leg swelling likely from low albumin which is being supplemented - Hold on restarting Losartan and coreg due to hypotension - creatinine normal - Plan for ischemic eval with cardiac CT tomorrow. Pressure have been soft. Will order ivabradine to lower HR. NPO after midnight  Hypokalemia - 2.4 on admission>>supplemented - today 3.1. IV potassium ordered - goal >4  Tobacco use - recommend cessation  Prolonged QT interval - QTc 518 ms in the ED - 633 ms today. After review QTc is likely around 400-500 ms - replete potassium - continue cardiac monitoring - avoid QT prolonging agents  For questions or updates, please contact [facility] Please consult [email or web address] for contact info under     Signed, Imri Lor Ninfa Meeker, PA-C  01/07/2020 12:27 PM

## 2020-01-07 NOTE — H&P (Signed)
History and Physical    Richard [name] [id number] DOB: January 02, 1966 DOA: 01/06/2020  PCP: Antony Blackbird, MD   Patient coming from: Home   Chief Complaint: Abdominal pain, swelling of abdomen and legs   HPI: Richard [name] is a 54 y.o. male with medical history significant for alcoholism in early remission, colon cancer status post colectomy, cardiomyopathy with EF 30 to 35% in February 2020, now presenting to the emergency department with severe abdominal pain, abdominal distention, and leg swelling.  Patient was discharged from the hospital on 12/14/2019, reports that he has maintained strict alcohol avoidance since then, but has developed progressively worsening pain throughout his abdomen, increasing abdominal distention, and progressive bilateral lower extremity swelling.  He denies any fevers or chills, denies cough or shortness of breath, and denies dysuria.  He denies hematemesis, melena, or hematochezia.  ED Course: Upon arrival to the ED, patient is found to be afebrile, saturating mid 90s on room air, and with stable blood pressure.  Chemistry panel notable for potassium 2.4, albumin of 1.9, AST 70, ALT 26, and bilirubin 9.3.  CBC with leukocytosis to 23,700 and stable normocytic anemia.  Lactic acid is 2.6.  Patient was given 2 g IV Rocephin, 20 mEq IV potassium, fentanyl, and Zofran in the ED.  COVID-19 PCR screening test not yet resulted.  ED physician planning to obtain ascitic fluid at the bedside and consults hospitalist for admission.  Review of Systems:  All other systems reviewed and apart from HPI, are negative.  Past Medical History:  Diagnosis Date  . Acid reflux   . Colon cancer ([place])   . ETOH abuse   . HTN (hypertension)   . Pancreatitis 08/2018  . PUD (peptic ulcer disease)     Past Surgical History:  Procedure Laterality Date  . BIOPSY  12/14/2019   Procedure: BIOPSY;  Surgeon: Mauri Pole, MD;  Location: [facility];  Service: Endoscopy;;  .  COLONOSCOPY WITH PROPOFOL N/A 12/14/2019   Procedure: COLONOSCOPY WITH PROPOFOL;  Surgeon: Mauri Pole, MD;  Location: [facility];  Service: Endoscopy;  Laterality: N/A;  . ESOPHAGOGASTRODUODENOSCOPY (EGD) WITH PROPOFOL N/A 12/14/2019   Procedure: ESOPHAGOGASTRODUODENOSCOPY (EGD) WITH PROPOFOL;  Surgeon: Mauri Pole, MD;  Location: [facility];  Service: Endoscopy;  Laterality: N/A;  . LAPAROSCOPIC SIGMOID COLECTOMY  2007  . POLYPECTOMY  12/14/2019   Procedure: POLYPECTOMY;  Surgeon: Mauri Pole, MD;  Location: [facility];  Service: Endoscopy;;     reports that he has been smoking. He has been smoking about 1.00 pack per day. He has never used smokeless tobacco. He reports current alcohol use of about 2.0 standard drinks of alcohol per week. He reports that he does not use drugs.  Allergies  Allergen Reactions  . Aspirin Other (See Comments)    Acid reflux   . Penicillins Hives    Has patient had a PCN reaction causing immediate rash, facial/tongue/throat swelling, SOB or lightheadedness with hypotension: yes Has patient had a PCN reaction causing severe rash involving mucus membranes or skin necrosis: no Has patient had a PCN reaction that required hospitalization: no Has patient had a PCN reaction occurring within the last 10 years: no If all of the above answers are "NO", then may proceed with Cephalosporin use.     Family History  Problem Relation Age of Onset  . Colon cancer Father   . Cancer Sister   . CAD Neg Hx   . Stroke Neg Hx   . Diabetes Neg  Hx      Prior to Admission medications   Medication Sig Start Date End Date Taking? Authorizing Provider  acetaminophen (TYLENOL) 500 MG tablet Take 1 tablet (500 mg total) by mouth every 6 (six) hours as needed for mild pain, moderate pain or headache. 12/14/19  Yes Hongalgi, Lenis Dickinson, MD  folic acid (FOLVITE) 1 MG tablet Take 1 tablet (1 mg total) by mouth daily. 12/14/19  Yes Hongalgi, Lenis Dickinson, MD   Multiple Vitamin (MULTIVITAMIN WITH MINERALS) TABS tablet Take 1 tablet by mouth daily. 12/15/19  Yes Hongalgi, Lenis Dickinson, MD  pantoprazole (PROTONIX) 40 MG tablet Take 1 tablet (40 mg total) by mouth 2 (two) times daily before a meal. 12/14/19  Yes Hongalgi, Lenis Dickinson, MD  thiamine 100 MG tablet Take 1 tablet (100 mg total) by mouth daily. 12/14/19  Yes Hongalgi, Lenis Dickinson, MD  nicotine (NICODERM CQ - DOSED IN MG/24 HOURS) 21 mg/24hr patch Place 1 patch (21 mg total) onto the skin daily. 12/15/19   Modena Jansky, MD    Physical Exam: Vitals:   01/06/20 2330 01/06/20 2331 01/07/20 0300 01/07/20 0400  BP:  108/70 107/66 98/66  Pulse:  90 76 77  Resp:  16 17 20   Temp:  98 F (36.7 C)    TempSrc:  Oral    SpO2: 94% 93% 93% 97%  Weight:  56.7 kg    Height:  6' (1.829 m)       Constitutional: NAD, calm  Eyes: PERTLA, lids and conjunctivae normal ENMT: Mucous membranes are moist. Posterior pharynx clear of any exudate or lesions.   Neck: normal, supple, no masses, no thyromegaly Respiratory: no wheezing, no crackles. No accessory muscle use.  Cardiovascular: S1 & S2 heard, regular rate and rhythm. Pretibial pitting edema bilaterally.   Abdomen: Distended, soft, generally tender, no rebound pain or guarding. Bowel sounds active.  Musculoskeletal: no clubbing / cyanosis. No joint deformity upper and lower extremities.   Skin: no significant rashes, lesions, ulcers. Warm, dry, well-perfused. Neurologic: CN 2-12 grossly intact. Sensation intact. Moving all extremities.  Psychiatric: Alert and oriented to person, place, and situation. Calm, cooperative.    Labs and Imaging on Admission: I have personally reviewed following labs and imaging studies  CBC: Recent Labs  Lab 01/07/20 0129  WBC 23.7*  NEUTROABS 19.6*  HGB 10.3*  HCT 29.7*  MCV 90.3  PLT 0000000   Basic Metabolic Panel: Recent Labs  Lab 01/07/20 0129  NA 133*  K 2.4*  CL 93*  CO2 28  GLUCOSE 93  BUN 11  CREATININE 0.84   CALCIUM 7.9*  MG 1.9   GFR: Estimated Creatinine Clearance: 82.5 mL/min (by C-G formula based on SCr of 0.84 mg/dL). Liver Function Tests: Recent Labs  Lab 01/07/20 0129  AST 70*  ALT 26  ALKPHOS 115  BILITOT 9.3*  PROT 6.2*  ALBUMIN 1.9*   Recent Labs  Lab 01/07/20 0129  LIPASE 19   Recent Labs  Lab 01/07/20 0129  AMMONIA 69*   Coagulation Profile: No results for input(s): INR, PROTIME in the last 168 hours. Cardiac Enzymes: No results for input(s): CKTOTAL, CKMB, CKMBINDEX, TROPONINI in the last 168 hours. BNP (last 3 results) No results for input(s): PROBNP in the last 8760 hours. HbA1C: No results for input(s): HGBA1C in the last 72 hours. CBG: No results for input(s): GLUCAP in the last 168 hours. Lipid Profile: No results for input(s): CHOL, HDL, LDLCALC, TRIG, CHOLHDL, LDLDIRECT in the last 72 hours. Thyroid  Function Tests: No results for input(s): TSH, T4TOTAL, FREET4, T3FREE, THYROIDAB in the last 72 hours. Anemia Panel: No results for input(s): VITAMINB12, FOLATE, FERRITIN, TIBC, IRON, RETICCTPCT in the last 72 hours. Urine analysis:    Component Value Date/Time   COLORURINE AMBER (A) 01/07/2020 0129   APPEARANCEUR HAZY (A) 01/07/2020 0129   LABSPEC 1.020 01/07/2020 0129   PHURINE 5.0 01/07/2020 0129   GLUCOSEU NEGATIVE 01/07/2020 0129   HGBUR NEGATIVE 01/07/2020 0129   BILIRUBINUR MODERATE (A) 01/07/2020 0129   KETONESUR NEGATIVE 01/07/2020 0129   PROTEINUR 100 (A) 01/07/2020 0129   UROBILINOGEN 0.2 01/13/2009 1011   NITRITE NEGATIVE 01/07/2020 0129   LEUKOCYTESUR NEGATIVE 01/07/2020 0129   Sepsis Labs: @LABRCNTIP (procalcitonin:4,lacticidven:4) )No results found for this or any previous visit (from the past 240 hour(s)).   Radiological Exams on Admission: DG Abdomen Acute W/Chest  Result Date: 01/07/2020 CLINICAL DATA:  Abdominal pain. EXAM: DG ABDOMEN ACUTE W/ 1V CHEST COMPARISON:  CT dated December 13, 2019 FINDINGS: The bowel gas pattern is  nonspecific. There are no air-fluid levels. The bowel appears to be centrally located which is suggestive of underlying ascites. There is hepatosplenomegaly. There are heterogeneous airspace opacities at the lung bases bilaterally which appear to have worsened since the prior CT. There is no pneumothorax. The heart size is stable. Aortic calcifications are noted. IMPRESSION: 1. Nonspecific and nonobstructive bowel gas pattern. 2. Hepatosplenomegaly with probable ascites. 3. Worsening bibasilar airspace opacities which may represent atelectasis, pneumonia, or aspiration. Electronically Signed   By: Constance Holster M.D.   On: 01/07/2020 01:23    EKG: Independently reviewed. Sinus rhythm, QTc 518 ms.   Assessment/Plan   1. Alcoholic hepatitis; history of alcohol dependence   - Presents with severe abdominal pain, abdominal distension, and bilateral leg swelling, and is found to have AST 70, ALT 26, total bili 9.3, and WBC 23,700  - He reports worsening pain despite abstinence from EtOH for 3-4 weeks now  - Continue alcohol avoidance, check INR and consider glucocorticoids (negative viral hepatitis panel last month), hydrate with albumin, continue vitamin supplementation    2. Hypokalemia  - Serum potassium is 2.4 in ED  - Continue replacement, repeat chem panel    3. Leukocytosis  - WBC is 23,700 in ED without fever  - No respiratory or urinary sxs, no cellulitis noted, and no meningismus  - Patient was given a dose of Rocephin in ED for possible SBP  - ED physician planning to obtain ascitic fluid  - Give albumin and continue Rocephin if >250/cc PMN in ascitic fluid   4. Tobacco abuse  - Counseled; continue nicotine patch   5. Prolonged QT interval  - QTc is 518 ms in ED  - Continue cardiac monitoring, replace potassium, minimize QT-prolonging medications    DVT prophylaxis: Lovenox  Code Status: Full  Family Communication: Discussed with patient  Disposition Plan: Likely home in  3-4 days pending ascitic fluid analysis, improvement in hepatitis, and pain-control with oral medications  Consults called: none  Admission status: Inpatient     Vianne Bulls, MD [facility] Pager: See [email or web address]  If 7AM-7PM, please contact the daytime attending [email or web address]  01/07/2020, 5:06 AM

## 2020-01-07 NOTE — Progress Notes (Signed)
Pt arrived to unit via stretcher room 1514 Alert and oriented x3. Pt oriented to room and callbell with no complications.

## 2020-01-07 NOTE — Progress Notes (Signed)
  Echocardiogram 2D Echocardiogram has been performed.  Richard [name] 01/07/2020, 12:37 PM

## 2020-01-07 NOTE — Progress Notes (Signed)
PROGRESS NOTE    Richard [name]  [id number] DOB: 1965-11-14 DOA: 01/06/2020 PCP: Antony Blackbird, MD     Brief Narrative:  Richard [name] is a 54 y.o. 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  Patient was discharged from the hospital on 12/14/2019, reports that he has maintained strict alcohol avoidance since then, but has developed progressively worsening pain throughout his abdomen, increasing abdominal distention, and progressive bilateral lower extremity swelling.  He denies any fevers or chills, denies cough or shortness of breath, and denies dysuria.  He denies hematemesis, melena, or hematochezia.  ED Course: Upon arrival to the ED, patient is found to be afebrile, saturating mid 90s on room air, and with stable blood pressure.  Chemistry panel notable for potassium 2.4, albumin of 1.9, AST 70, ALT 26, and bilirubin 9.3.  CBC with leukocytosis to 23,700 and stable normocytic anemia.  Lactic acid is 2.6.  Patient was given 2 g IV Rocephin, 20 mEq IV potassium, fentanyl, and Zofran in the ED.  COVID-19 PCR screening test not yet resulted.  ED physician planning to obtain ascitic fluid at the bedside and consults hospitalist for admission.   Subjective:    Assessment & Plan:   Principal Problem:   Alcoholic hepatitis Active Problems:   Pancreatitis   HTN (hypertension)   Hypokalemia   Leukocytosis   ETOH abuse   Alcoholic cardiomyopathy (HCC)   Drug abuse (HCC)   Prolonged Q-T interval on ECG   DCM (dilated cardiomyopathy) (HCC)   Chronic systolic CHF (congestive heart failure) (HCC)   SBP (spontaneous bacterial peritonitis) ([place])   Tobacco abuse   Septal infarction ([place])   EtOH abuse in remission -4/6 EtOH negative  Drug abuse -4/6 urine  drug screen positive marijuana -Patient and family counseled that given his liver failure and their wish to seek aggressive treatment i.e. possibly liver transplant ANY type of illicit drug use would disqualify patient from transplant program. -Upon discharge patient will require Hepatologist.  In A.m. consult GI to get patient tied into system.  EtOH Hepatitis/Ascites -EtOH abstinence~weeks.  Counseled that ANY EtOH use would disqualify him from transplant program -Severe abdominal distention, and pain.  Pain multifactorial from distention as well as possible SBP. -4/5 diagnostic paracentesis performed results pending -4/6 therapeutic IR paracentesis ordered pending -4/7 albumin 25 g administer 0500  SBP -Given patient's leukocytosis> 23,000 continue Ceftriaxone empirically until peritoneal cultures return -Continue fentanyl IV pain control  Hypokalemia  -Potassium goal> 4 -Potassium IV 60 mEq  Leukocytosis -Does not meet criteria for SIRS/sepsis upon admission -WBC 23,700 in ED -4/6 peritoneal fluid aspirated; cultures pending -4/6 blood cultures pending  Tobacco abuse -Counseled on cessation -Nicotine patch  EtOH cardiomyopathy/Acute on Chronic systolic CHF?  -EF 30 to [id number] on echocardiogram 11/08/2018 -2/6 Echocardiogram pending -Strict in and out -Daily weight -Transfuse for hemoglobin<8 -Lasix (hold).  Patient to have IR paracentesis in the a.m. determine appropriate dose after paracentesis performed.  Prolonged QT interval - QTc is 518 ms in ED  -4/6 repeat echocardiogram QTC increasing 633ms  Septal infarct -Age indeterminate -Trend troponin  Elevated INR -Secondary to patient's liver failure hold all anticoagulation. -Prior to paracentesis would administer vitamin K 5 mg, consider 1 unit FFP    DVT prophylaxis: SCD.  Patient is supratherapeutic INR Code  Status: Full Family Communication: 4/6 mother and father at bedside discussed plan of care answered  all questions Disposition Plan:  1.  Where the patient is from 2.  Anticipated d/c place. 3.  Barriers to d/c medical stability from SBP, decompensated liver cirrhosis with ascites   Consultants:  IR    Procedures/Significant Events:  4/6 paracentesis; 30 cc aspirated     I have personally reviewed and interpreted all radiology studies and my findings are as above.  VENTILATOR SETTINGS:    Cultures 3/12 SARS coronavirus negative ______________________________________________________________________________ 4/6 SARS coronavirus negative 4/6 peritoneal fluid pending 4/6 blood pending   Antimicrobials: Anti-infectives (From admission, onward)   Start     Dose/Rate Stop   01/07/20 0330  cefTRIAXone (ROCEPHIN) 2 g in sodium chloride 0.9 % 100 mL IVPB     2 g 200 mL/hr over 30 Minutes 01/07/20 0509       Devices    LINES / TUBES:      Continuous Infusions: . sodium chloride    . [START ON 01/08/2020] albumin human    . cefTRIAXone (ROCEPHIN)  IV       Objective: Vitals:   01/07/20 1439 01/07/20 1520 01/07/20 1817 01/07/20 2013  BP: 111/70  103/63 107/60  Pulse: 84 83 90 92  Resp: 20  18 20   Temp: 98.1 F (36.7 C)  98 F (36.7 C) 98.4 F (36.9 C)  TempSrc:      SpO2: 97%  94% 93%  Weight:      Height:        Intake/Output Summary (Last 24 hours) at 01/07/2020 2100 Last data filed at 01/07/2020 1700 Gross per 24 hour  Intake 1280.72 ml  Output 275 ml  Net 1005.72 ml   Filed Weights   01/06/20 2331  Weight: 56.7 kg    Examination:  General: A/O x4 no acute respiratory distress Eyes: negative scleral hemorrhage, negative anisocoria, positive mild icterus ENT: Negative Runny nose, negative gingival bleeding, Neck:  Negative scars, masses, torticollis, lymphadenopathy, JVD Lungs: Clear to auscultation bilaterally without wheezes or crackles Cardiovascular: Regular rate and rhythm without murmur gallop or rub normal S1 and S2 Abdomen: Positive  abdominal pain, positive severe distention, positive soft, bowel sounds, no rebound, no ascites, no appreciable mass Extremities: No significant cyanosis, clubbing.  Positive bilateral lower extremity edema 2+ Skin: Negative rashes, lesions, ulcers Psychiatric:  Negative depression, negative anxiety, negative fatigue, negative mania  Central nervous system:  Cranial nerves II through XII intact, tongue/uvula midline, all extremities muscle strength 5/5, sensation intact  negative dysarthria, negative expressive aphasia, negative receptive aphasia.  .     Data Reviewed: Care during the described time interval was provided by me .  I have reviewed this patient's available data, including medical history, events of note, physical examination, and all test results as part of my evaluation.  CBC: Recent Labs  Lab 01/07/20 0129 01/07/20 0944  WBC 23.7* 22.8*  NEUTROABS 19.6* 18.9*  HGB 10.3* 10.2*  HCT 29.7* 30.0*  MCV 90.3 91.5  PLT 243 [id number]   Basic Metabolic Panel: Recent Labs  Lab 01/07/20 0129 01/07/20 0944 01/07/20 0946  NA 133* 134* 133*  K 2.4* 3.1* 3.1*  CL 93* 96* 97*  CO2 28 26 29   GLUCOSE 93 87 92  BUN 11 10 10   CREATININE 0.84 0.67 0.64  CALCIUM 7.9* 7.6* 7.6*  MG 1.9 1.6*  --   PHOS  --  3.0  --    GFR: Estimated Creatinine Clearance:  86.6 mL/min (by C-G formula based on SCr of 0.64 mg/dL). Liver Function Tests: Recent Labs  Lab 01/07/20 0129 01/07/20 0944  AST 70* 65*  ALT 26 23  ALKPHOS 115 101  BILITOT 9.3* 8.4*  PROT 6.2* 5.6*  ALBUMIN 1.9* 1.7*   Recent Labs  Lab 01/07/20 0129  LIPASE 19   Recent Labs  Lab 01/07/20 0129  AMMONIA 69*   Coagulation Profile: Recent Labs  Lab 01/07/20 0516  INR 2.3*   Cardiac Enzymes: No results for input(s): CKTOTAL, CKMB, CKMBINDEX, TROPONINI in the last 168 hours. BNP (last 3 results) No results for input(s): PROBNP in the last 8760 hours. HbA1C: No results for input(s): HGBA1C in the last 72  hours. CBG: No results for input(s): GLUCAP in the last 168 hours. Lipid Profile: No results for input(s): CHOL, HDL, LDLCALC, TRIG, CHOLHDL, LDLDIRECT in the last 72 hours. Thyroid Function Tests: No results for input(s): TSH, T4TOTAL, FREET4, T3FREE, THYROIDAB in the last 72 hours. Anemia Panel: No results for input(s): VITAMINB12, FOLATE, FERRITIN, TIBC, IRON, RETICCTPCT in the last 72 hours. Sepsis Labs: Recent Labs  Lab 01/07/20 0129 01/07/20 0946 01/07/20 1156  PROCALCITON  --  0.49  --   LATICACIDVEN 2.6* 1.2 1.3    Recent Results (from the past 240 hour(s))  SARS CORONAVIRUS 2 (TAT 6-24 HRS) Nasopharyngeal Nasopharyngeal Swab     Status: None   Collection Time: 01/07/20  2:25 AM   Specimen: Nasopharyngeal Swab  Result Value Ref Range Status   SARS Coronavirus 2 NEGATIVE NEGATIVE Final    Comment: (NOTE) SARS-CoV-2 target nucleic acids are NOT DETECTED. The SARS-CoV-2 RNA is generally detectable in upper and lower respiratory specimens during the acute phase of infection. Negative results do not preclude SARS-CoV-2 infection, do not rule out co-infections with other pathogens, and should not be used as the sole basis for treatment or other patient management decisions. Negative results must be combined with clinical observations, patient history, and epidemiological information. The expected result is Negative. Fact Sheet for Patients: SugarRoll.be Fact Sheet for Healthcare Providers: https://www.Marquise Lambson-mathews.com/ This test is not yet approved or cleared by the [place] FDA and  has been authorized for detection and/or diagnosis of SARS-CoV-2 by FDA under an Emergency Use Authorization (EUA). This EUA will remain  in effect (meaning this test can be used) for the duration of the COVID-19 declaration under Section 56 4(b)(1) of the Act, 21 U.S.C. section 360bbb-3(b)(1), unless the authorization is terminated or revoked  sooner. Performed at [facility], [place] [address], [place], [place] [address]   Body fluid culture     Status: None (Preliminary result)   Collection Time: 01/07/20  5:53 AM   Specimen: Peritoneal Cavity; Peritoneal Fluid  Result Value Ref Range Status   Specimen Description PERITONEAL CAVITY  Final   Special Requests NONE  Final   Gram Stain   Final    WBC PRESENT,BOTH PMN AND MONONUCLEAR NO ORGANISMS SEEN Gram Stain Report Called to,Read Back By and Verified WithMee Hives @ [phone] 01/07/2020 Performed at [facility], [place] [address], [place], [place] [address]    Culture PENDING  Incomplete   Report Status PENDING  Incomplete         Radiology Studies: DG Abdomen Acute W/Chest  Result Date: 01/07/2020 CLINICAL DATA:  Abdominal pain. EXAM: DG ABDOMEN ACUTE W/ 1V CHEST COMPARISON:  CT dated December 13, 2019 FINDINGS: The bowel gas pattern is nonspecific. There are no air-fluid levels. The bowel  appears to be centrally located which is suggestive of underlying ascites. There is hepatosplenomegaly. There are heterogeneous airspace opacities at the lung bases bilaterally which appear to have worsened since the prior CT. There is no pneumothorax. The heart size is stable. Aortic calcifications are noted. IMPRESSION: 1. Nonspecific and nonobstructive bowel gas pattern. 2. Hepatosplenomegaly with probable ascites. 3. Worsening bibasilar airspace opacities which may represent atelectasis, pneumonia, or aspiration. Electronically Signed   By: Constance Holster M.D.   On: 01/07/2020 01:23   ECHOCARDIOGRAM COMPLETE  Result Date: 01/07/2020    ECHOCARDIOGRAM REPORT   Patient Name:   JUNIOR FETTERHOFF Huq Date of Exam: 01/07/2020 Medical Rec #:  [id number]     Height:       72.0 in Accession #:    [id number]    Weight:       125.0 lb Date of Birth:  [birth year]     BSA:          1.745 m Patient Age:    48 years      BP:           99/64 mmHg Patient Gender: M             HR:            78 bpm. Exam Location:  Inpatient Procedure: 2D Echo Indications:    414.8 cardiomyopathy  History:        Patient has prior history of Echocardiogram examinations, most                 recent 11/08/2018. Risk Factors:Hypertension and Current Smoker.                 ETOH Abuse.  Sonographer:    Jannett Celestine RDCS (AE) Referring Phys: [id number] [place] Comments: Suboptimal subcostal window. IMPRESSIONS  1. Left ventricular ejection fraction, by estimation, is 60 to 65%. The left ventricle has normal function. The left ventricle has no regional wall motion abnormalities. Left ventricular diastolic parameters were normal.  2. Right ventricular systolic function is normal. The right ventricular size is normal.  3. The mitral valve is normal in structure. No evidence of mitral valve regurgitation. No evidence of mitral stenosis.  4. The aortic valve is normal in structure. Aortic valve regurgitation is not visualized. No aortic stenosis is present.  5. The inferior vena cava is normal in size with greater than 50% respiratory variability, suggesting right atrial pressure of 3 mmHg. Comparison(s): Prior images unable to be directly viewed, comparison made by report only. Changes from prior study are noted. The left ventricular function has improved. FINDINGS  Left Ventricle: Left ventricular ejection fraction, by estimation, is 60 to 65%. The left ventricle has normal function. The left ventricle has no regional wall motion abnormalities. The left ventricular internal cavity size was normal in size. There is  no left ventricular hypertrophy. Left ventricular diastolic parameters were normal. Normal left ventricular filling pressure. Right Ventricle: The right ventricular size is normal. No increase in right ventricular wall thickness. Right ventricular systolic function is normal. Left Atrium: Left atrial size was normal in size. Right Atrium: Right atrial size was normal in size. Pericardium: There is no  evidence of pericardial effusion. Mitral Valve: The mitral valve is normal in structure. Normal mobility of the mitral valve leaflets. No evidence of mitral valve regurgitation. No evidence of mitral valve stenosis. Tricuspid Valve: The tricuspid valve is normal in structure. Tricuspid valve regurgitation is not demonstrated. No evidence  of tricuspid stenosis. Aortic Valve: The aortic valve is normal in structure. Aortic valve regurgitation is not visualized. No aortic stenosis is present. Pulmonic Valve: The pulmonic valve was normal in structure. Pulmonic valve regurgitation is not visualized. No evidence of pulmonic stenosis. Aorta: The aortic root is normal in size and structure. Venous: The inferior vena cava is normal in size with greater than 50% respiratory variability, suggesting right atrial pressure of 3 mmHg. IAS/Shunts: No atrial level shunt detected by color flow Doppler.  LEFT VENTRICLE PLAX 2D LVIDd:         3.80 cm  Diastology LVIDs:         2.50 cm  LV e' lateral:   14.90 cm/s LV PW:         0.90 cm  LV E/e' lateral: 6.2 LV IVS:        0.70 cm  LV e' medial:    12.10 cm/s LVOT diam:     1.90 cm  LV E/e' medial:  7.6 LV SV:         72 LV SV Index:   41 LVOT Area:     2.84 cm  RIGHT VENTRICLE RV S prime:     13.90 cm/s TAPSE (M-mode): 2.5 cm LEFT ATRIUM             Index       RIGHT ATRIUM           Index LA diam:        3.80 cm 2.18 cm/m  RA Area:     13.60 cm LA Vol (A2C):   39.5 ml 22.64 ml/m RA Volume:   31.10 ml  17.82 ml/m LA Vol (A4C):   41.0 ml 23.50 ml/m LA Biplane Vol: 41.0 ml 23.50 ml/m  AORTIC VALVE LVOT Vmax:   139.00 cm/s LVOT Vmean:  104.000 cm/s LVOT VTI:    0.254 m  AORTA Ao Root diam: 3.20 cm MITRAL VALVE MV Area (PHT): 5.75 cm    SHUNTS MV Decel Time: 132 msec    Systemic VTI:  0.25 m MV E velocity: 92.40 cm/s  Systemic Diam: 1.90 cm MV A velocity: 77.10 cm/s MV E/A ratio:  1.20 Mihai Croitoru MD Electronically signed by Sanda Klein MD Signature Date/Time:  01/07/2020/1:49:36 PM    Final         Scheduled Meds: . [START ON 01/08/2020] fentaNYL (SUBLIMAZE) injection  25 mcg Intravenous Q3H while awake  . folic acid  1 mg Oral Daily  . [START ON 01/08/2020] ivabradine  7.5 mg Oral Once  . multivitamin with minerals  1 tablet Oral Daily  . nicotine  21 mg Transdermal Daily  . pantoprazole  40 mg Oral BID AC  . potassium chloride  40 mEq Oral Once  . sodium chloride flush  3 mL Intravenous Q12H  . sodium chloride flush  3 mL Intravenous Q12H  . thiamine  100 mg Oral Daily   Continuous Infusions: . sodium chloride    . [START ON 01/08/2020] albumin human    . cefTRIAXone (ROCEPHIN)  IV       LOS: 0 days    Time spent:40 min    Corleone Biegler, Geraldo Docker, MD [facility] Pager [phone]  If 7PM-7AM, please contact night-coverage [email or web address] Password [email or web address] 01/07/2020, 9:00 PM

## 2020-01-07 NOTE — ED Notes (Signed)
Patient transported to x-ray. ?

## 2020-01-07 NOTE — ED Notes (Signed)
Date and time results received: 01/07/20 2:01 AM (use smartphrase ".now" to insert current time)  Test: Potassium Critical Value: 2.4  Name of Provider Notified: Charlann Lange   Orders Received? Or Actions Taken?:

## 2020-01-08 ENCOUNTER — Inpatient Hospital Stay (HOSPITAL_COMMUNITY): Payer: Self-pay

## 2020-01-08 LAB — CBC WITH DIFFERENTIAL/PLATELET
Abs Immature Granulocytes: 0.21 10*3/uL — ABNORMAL HIGH (ref 0.00–0.07)
Basophils Absolute: 0.2 10*3/uL — ABNORMAL HIGH (ref 0.0–0.1)
Basophils Relative: 1 %
Eosinophils Absolute: 0.4 10*3/uL (ref 0.0–0.5)
Eosinophils Relative: 2 %
HCT: 28 % — ABNORMAL LOW (ref 39.0–52.0)
Hemoglobin: 9.7 g/dL — ABNORMAL LOW (ref 13.0–17.0)
Immature Granulocytes: 1 %
Lymphocytes Relative: 8 %
Lymphs Abs: 1.7 10*3/uL (ref 0.7–4.0)
MCH: 31.4 pg (ref 26.0–34.0)
MCHC: 34.6 g/dL (ref 30.0–36.0)
MCV: 90.6 fL (ref 80.0–100.0)
Monocytes Absolute: 1.5 10*3/uL — ABNORMAL HIGH (ref 0.1–1.0)
Monocytes Relative: 7 %
Neutro Abs: 19 10*3/uL — ABNORMAL HIGH (ref 1.7–7.7)
Neutrophils Relative %: 81 %
Platelets: 254 10*3/uL (ref 150–400)
RBC: 3.09 MIL/uL — ABNORMAL LOW (ref 4.22–5.81)
RDW: 19.2 % — ABNORMAL HIGH (ref 11.5–15.5)
WBC: 23 10*3/uL — ABNORMAL HIGH (ref 4.0–10.5)
nRBC: 0 % (ref 0.0–0.2)

## 2020-01-08 LAB — COMPREHENSIVE METABOLIC PANEL
ALT: 22 U/L (ref 0–44)
AST: 62 U/L — ABNORMAL HIGH (ref 15–41)
Albumin: 1.8 g/dL — ABNORMAL LOW (ref 3.5–5.0)
Alkaline Phosphatase: 98 U/L (ref 38–126)
Anion gap: 10 (ref 5–15)
BUN: 7 mg/dL (ref 6–20)
CO2: 27 mmol/L (ref 22–32)
Calcium: 7.8 mg/dL — ABNORMAL LOW (ref 8.9–10.3)
Chloride: 97 mmol/L — ABNORMAL LOW (ref 98–111)
Creatinine, Ser: 0.57 mg/dL — ABNORMAL LOW (ref 0.61–1.24)
GFR calc Af Amer: >60 mL/min (ref >60)
GFR calc non Af Amer: >60 mL/min (ref >60)
Glucose, Bld: 95 mg/dL (ref 70–99)
Potassium: 3.1 mmol/L — ABNORMAL LOW (ref 3.5–5.1)
Sodium: 134 mmol/L — ABNORMAL LOW (ref 135–145)
Total Bilirubin: 8.1 mg/dL — ABNORMAL HIGH (ref 0.3–1.2)
Total Protein: 5.5 g/dL — ABNORMAL LOW (ref 6.5–8.1)

## 2020-01-08 LAB — PROCALCITONIN: Procalcitonin: 0.6 ng/mL

## 2020-01-08 LAB — MAGNESIUM: Magnesium: 1.5 mg/dL — ABNORMAL LOW (ref 1.7–2.4)

## 2020-01-08 LAB — PROTIME-INR
INR: 2.1 — ABNORMAL HIGH (ref 0.8–1.2)
Prothrombin Time: 23.3 seconds — ABNORMAL HIGH (ref 11.4–15.2)

## 2020-01-08 LAB — PHOSPHORUS: Phosphorus: 1.7 mg/dL — ABNORMAL LOW (ref 2.5–4.6)

## 2020-01-08 MED ORDER — LACTULOSE 10 GM/15ML PO SOLN
10.0000 g | Freq: Two times a day (BID) | ORAL | Status: DC
  Administered 2020-01-08 – 2020-01-11 (×6): 10 g via ORAL
  Filled 2020-01-08 (×7): qty 15

## 2020-01-08 MED ORDER — IVABRADINE HCL 5 MG PO TABS
7.5000 mg | ORAL_TABLET | Freq: Once | ORAL | Status: AC
  Administered 2020-01-09: 7.5 mg via ORAL
  Filled 2020-01-08: qty 2

## 2020-01-08 MED ORDER — LIDOCAINE 5 % EX PTCH
1.0000 | MEDICATED_PATCH | CUTANEOUS | Status: AC
  Administered 2020-01-08: 1 via TRANSDERMAL
  Filled 2020-01-08: qty 1

## 2020-01-08 MED ORDER — POTASSIUM CHLORIDE CRYS ER 20 MEQ PO TBCR
80.0000 meq | EXTENDED_RELEASE_TABLET | Freq: Once | ORAL | Status: AC
  Administered 2020-01-08: 80 meq via ORAL
  Filled 2020-01-08: qty 4

## 2020-01-08 MED ORDER — MAGNESIUM SULFATE 2 GM/50ML IV SOLN
2.0000 g | Freq: Once | INTRAVENOUS | Status: AC
  Administered 2020-01-08: 2 g via INTRAVENOUS
  Filled 2020-01-08: qty 50

## 2020-01-08 MED ORDER — MAGNESIUM OXIDE 400 (241.3 MG) MG PO TABS
400.0000 mg | ORAL_TABLET | Freq: Two times a day (BID) | ORAL | Status: DC
  Administered 2020-01-08 – 2020-01-16 (×15): 400 mg via ORAL
  Filled 2020-01-08 (×16): qty 1

## 2020-01-08 MED ORDER — LIDOCAINE HCL 1 % IJ SOLN
INTRAMUSCULAR | Status: AC
  Filled 2020-01-08: qty 20

## 2020-01-08 MED ORDER — LORAZEPAM 1 MG PO TABS
1.0000 mg | ORAL_TABLET | ORAL | Status: DC | PRN
  Administered 2020-01-10 – 2020-01-12 (×5): 1 mg via ORAL
  Filled 2020-01-08 (×5): qty 1

## 2020-01-08 MED ORDER — POTASSIUM CHLORIDE CRYS ER 20 MEQ PO TBCR
40.0000 meq | EXTENDED_RELEASE_TABLET | Freq: Every day | ORAL | Status: DC
  Administered 2020-01-09 – 2020-01-10 (×2): 40 meq via ORAL
  Filled 2020-01-08 (×2): qty 2

## 2020-01-08 NOTE — TOC Initial Note (Signed)
Transition of Care [facility]) - Initial/Assessment Note    Patient Details  Name: Richard [name] MRN: [id number] Date of Birth: Nov 06, 1965  [facility] CM/SW Contact:    Trish Mage, LCSW Phone Number: 01/08/2020, 11:52 AM  Clinical Narrative:    Patient seen in response to Dr consult for SA, medication needs.  Mr Klontz has no insurance, no income, lives with his brother and denies current alcohol use.  "I went through detox here a couple fo weeks ago.  I know if I drink it messes with my liver."  Says he has applied for disability in the past, but was denied.  I suggested he use an attorney to try again as he is unable to work due to medical condition.  He has transportation as provided by family, or takes the bus.  He is open to following up with PCP, is not sure if he has one, but chart indicates he has been seen at [facility], and I was able to get him a follow up appointment there. TOC will continue to follow during the course of hospitalization.               Expected Discharge Plan: Home/Self Care Barriers to Discharge: No Barriers Identified   Patient Goals and CMS Choice Patient states their goals for this hospitalization and ongoing recovery are:: "I have not been drinking.  It's bad for my liver."      Expected Discharge Plan and Services Expected Discharge Plan: Home/Self Care   Discharge Planning Services: CM Consult   Living arrangements for the past 2 months: Apartment                                      Prior Living Arrangements/Services Living arrangements for the past 2 months: Apartment Lives with:: Siblings Patient language and need for interpreter reviewed:: Yes Do you feel safe going back to the place where you live?: Yes      Need for Family Participation in Patient Care: Yes (Comment) Care giver support system in place?: Yes (comment)   Criminal Activity/Legal Involvement Pertinent to Current  Situation/Hospitalization: No - Comment as needed  Activities of Daily Living Home Assistive Devices/Equipment: None ADL Screening (condition at time of admission) Patient's cognitive ability adequate to safely complete daily activities?: Yes Is the patient deaf or have difficulty hearing?: No Does the patient have difficulty seeing, even when wearing glasses/contacts?: Yes Does the patient have difficulty concentrating, remembering, or making decisions?: Yes Patient able to express need for assistance with ADLs?: Yes Does the patient have difficulty dressing or bathing?: No Independently performs ADLs?: Yes (appropriate for developmental age) Does the patient have difficulty walking or climbing stairs?: Yes Weakness of Legs: Both Weakness of Arms/Hands: None  Permission Sought/Granted                  Emotional Assessment Appearance:: Appears stated age Attitude/Demeanor/Rapport: Engaged Affect (typically observed): Appropriate Orientation: : Oriented to Self, Oriented to Place, Oriented to Situation Alcohol / Substance Use: Not Applicable Psych Involvement: No (comment)  Admission diagnosis:  Hypokalemia 99991111 Alcoholic hepatitis 99991111 Generalized abdominal pain [R10.84] Ascites due to alcoholic cirrhosis ([place]) [id number] Patient Active Problem List   Diagnosis Date Noted  . Alcoholic hepatitis 123456  . Hypokalemia 01/07/2020  . Leukocytosis 01/07/2020  . ETOH abuse 01/07/2020  . Alcoholic cardiomyopathy ([place]) 01/07/2020  . Drug abuse (  [place]) 01/07/2020  . SBP (spontaneous bacterial peritonitis) ([place]) 01/07/2020  . Tobacco abuse 01/07/2020  . Septal infarction ([place]) 01/07/2020  . Prolonged QT interval   . Prolonged Q-T interval on ECG   . DCM (dilated cardiomyopathy) ([place])   . Chronic systolic CHF (congestive heart failure) ([place])   . Polyp of ascending colon   . History of colon cancer   . Ascites 12/13/2019  . SVT (supraventricular tachycardia) ([place])    . Hypomagnesemia   . Hypophosphatemia   . High anion gap metabolic acidosis 123456  . Acute pancreatitis 08/14/2018  . Smoker 08/14/2018  . 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 HTN (hypertension) 08/14/2018  . Abdominal pain 06/27/2018  . Hypertensive urgency 06/27/2018  . Elevated troponin 06/27/2018  . Hypoglycemia 06/27/2018  . Protein-calorie malnutrition, severe 05/02/2018  . Pancreatitis 05/01/2018  . Heme positive stool 11/13/2017  . Alcoholic ketoacidosis [id number]  . Hepatic steatosis 08/21/2016  . Thrombocytopenia ([place]) 08/21/2016  . Alcohol abuse 08/21/2016  . Alcohol withdrawal delirium ([place]) 08/20/2016  . Dehydration 08/20/2016  . Intractable nausea and vomiting 08/20/2016  . Lactic acidosis 08/20/2016  . Aspiration pneumonia ([place]) 08/20/2016  . Sepsis ([place]) 08/20/2016   PCP:  Antony Blackbird, MD Pharmacy:   [id number] [address], [place] [address] Phone: [phone] Fax: [place], [place] [address] [place] [address] Phone: [phone] Fax: [phone]     Social Determinants of Health (SDOH) Interventions    Readmission Risk Interventions No flowsheet data found.

## 2020-01-08 NOTE — Procedures (Signed)
Ultrasound-guided  therapeutic paracentesis performed yielding 3.1 liters of yellow fluid. No immediate complications. EBL none.   

## 2020-01-08 NOTE — Progress Notes (Signed)
Initial Nutrition Assessment  DOCUMENTATION CODES:   Not applicable  INTERVENTION:  Monitor for diet advancement and provide nutrition supplements as appropriate  Continue to monitor refeeding labs   NUTRITION DIAGNOSIS:   Inadequate oral intake related to acute illness(EtOH hepatitis) as evidenced by per patient/family report(severe abdominal pain, NPO for US guided paracentesis).    GOAL:   Patient will meet greater than or equal to 90% of their needs    MONITOR:   Diet advancement, Labs, Weight trends, I & O's, Skin, PO intake  REASON FOR ASSESSMENT:   Malnutrition Screening Tool    ASSESSMENT:  54 year old male with past medical history of EtOH abuse in early remission, colon cancer s/p colectomy, cardiomyopathy with EF 30-35% presented with severe abdominal pain, abdominal distention, leg swelling and admitted on 4/6 for alcoholic hepatitis.  Per chart review, patient reports that he was unable to sleep well and appears anxious today, possibly related to withdrawal. Per history, on 12/14/19 pt weighed 117 lbs, on 04/27/19 pt weighed 130 lbs. Currently he weighs 144 lbs, suspect uptrend of weights related to fluid status. Patient is currently NPO for IR paracentesis this morning. Will continue to monitor diet advancement and provide nutrition supplements as appropriate.   Per notes: -ischemic eval with cardiac CT postponed until tomorrow, NPO after midnight  -minimal edema, likely r/t 3rd spacing from low albumin than form CHF  Medications reviewed and include: Fentanyl, Folic acid, Lactulose, MVI, Klor-con, Thiamine IVPB: Rocephin Labs: Na 134 (L), K 3.1 (L), Mg 1.5 (L), P 1.7 (L), WBC 23 (H)   NUTRITION - FOCUSED PHYSICAL EXAM: Unable to complete at this time.   Diet Order:   Diet Order            Diet Heart Room service appropriate? Yes; Fluid consistency: Thin; Fluid restriction: 1200 mL Fluid  Diet effective now              EDUCATION NEEDS:   Not  appropriate for education at this time  Skin:  Skin Assessment: Reviewed RN Assessment(juandice)  Last BM:  PTA  Height:   Ht Readings from Last 1 Encounters:  01/06/20 6' (1.829 m)    Weight:   Wt Readings from Last 1 Encounters:  01/08/20 65.5 kg    Ideal Body Weight:     BMI:  Body mass index is 19.58 kg/m.  Estimated Nutritional Needs:   Kcal:  1800-2000  Protein:  90-100  Fluid:  >/= 1.8 L/day   Lajuan Lines, RD, LDN Clinical Nutrition After Hours/Weekend Pager # in [place]

## 2020-01-08 NOTE — Progress Notes (Signed)
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  Of note, patient was recently discharged from the hospital on 12/14/2019 and ever since then he has not drank alcohol.  He has however noted increasing abdominal distention and lower extremity edema so he decided come to the hospital.  ED Course:Upon arrival to the ED, patient is found to be afebrile, saturating mid 90s on room air, and with stable blood pressure. Chemistry panel notable for potassium 2.4, albumin of 1.9, AST 70, ALT 26, and bilirubin 9.3. CBC with leukocytosis to 23,700 and stable normocytic anemia. Lactic acid was 2.6. Patient was given 2 g IV Rocephin, 20 mEq IV potassium, fentanyl, and Zofran in the ED. COVID-19 PCR was negative.    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  Counseled about it.  Marijuana/tobacco use disorder. 4/6 urine drug screen positive marijuana.  Counseled on abstinence of alcohol.  Continue nicotine patch  EtOH Hepatitis/Ascites with abdominal pain -EtOH abstinence currently.   Reinforced abstinence from alcohol to qualify for liver transplant in the  future.   Abdominal pain and distention from ascites and abdominal distention.  Diagnostic paracentesis revealed total of 124 cells and culture was negative.  Empirically being treated with IV Rocephin for now  Since with significant leukocytosis and abdominal pain..  Will consider therapeutic tapping with albumin.  No fever at this time.  Leukocytosis still significant at 23,000.  Continue thiamine.  Continue Protonix for now.  Blood cultures negative in less than 24 hours.  AST mildly elevated at 62 ALT of 22.  Total Bilirubin at 8.1.  Creatinine 0.5.  Hypoalbuminemia likely contributing to lower extremity edema as well.  We will add lactulose for constipation.  Add fluid restriction and low-salt diet.  Hypokalemia, mild hypomagnesemia  continue to replenish aggressively.  Check levels in a.m.  Check magnesium levels as well.  Received 80 mEq of potassium and 2 g of magnesium sulfate this morning.  EtOH cardiomyopathy/Acute on Chronic systolic CHF -EF 30 to [id number] on echocardiogram 11/08/2018. 2/6 Echocardiogram showed ejection fraction at 60 to 65%.  Continue strict in and out charting.  Continue daily weights.  Lasix on hold at this time.  Cardiology on board.  Cardiology recommending coronary CT to rule out ischemic cardiomyopathy likely tomorrow due to tachycardia.  Anxiety.  Add as needed Ativan.  Prolonged QT interval - QTc is 518 ms in ED.  Negative troponins. Cardiology on board.  Elevated INR Last INR 2.3.  Likely secondary to alcoholic hepatitis.   VTE Prophylaxis: SCD  Code Status: Full code  Family Communication: None today  Disposition Plan:  . Patient is from home . Likely disposition  to home in 2 to 3 days . Barriers to discharge: IR for paracentesis, CT coronary pending,  IV antibiotic.  Need for GI evaluation, will need physical therapy evaluation  Consultants:  IR  Procedures:  01/07/2020.  Diagnostic paracentesis  Antibiotics:  . Rocephin iv  01/07/20>  Anti-infectives (From admission, onward)   Start     Dose/Rate Route Frequency Ordered Stop   01/07/20 2200  cefTRIAXone (ROCEPHIN) 2 g in sodium chloride 0.9 % 100 mL IVPB     2 g 200 mL/hr over 30 Minutes Intravenous Every 24 hours 01/07/20 1613     01/07/20 0330  cefTRIAXone (ROCEPHIN) 2 g in sodium chloride 0.9 % 100 mL IVPB     2 g 200 mL/hr over 30 Minutes Intravenous  Once 01/07/20 0325 01/07/20 0509      Subjective: Today, patient was seen and examined at bedside. Complains of diffuse abdominal pain, constipation, no nausea. Low appettie.  Complains of abdominal distention as well.  Complains of mild shortness of breath.  Denies any chest pain.  Objective: Vitals:   01/07/20 2013 01/08/20 0447  BP: 107/60 93/63  Pulse: 92 90  Resp: 20 20  Temp: 98.4 F (36.9 C) 98.2 F (36.8 C)  SpO2: 93% 93%    Intake/Output Summary (Last 24 hours) at 01/08/2020 0845 Last data filed at 01/08/2020 0425 Gross per 24 hour  Intake 1180.72 ml  Output 375 ml  Net 805.72 ml   Filed Weights   01/06/20 2331 01/08/20 0457  Weight: 56.7 kg 65.5 kg   Body mass index is 19.58 kg/m.   Physical Exam: GENERAL: Patient is alert awake and oriented. Not in obvious distress. Thinly bulit. HENT: No scleral pallor but icterus, Pupils equally reactive to light. Oral mucosa is moist NECK: is supple, no gross swelling noted. CHEST: Clear to auscultation. No crackles or wheezes.  Diminished breath sounds bilaterally. CVS: S1 and S2 heard, no murmur. Regular rate and rhythm.  ABDOMEN: Soft, abdominal distention++ with diffuse tenderness, bowel sounds are present. EXTREMITIES: No edema. CNS: Cranial nerves are intact. No focal motor deficits. SKIN: warm and dry without rashes.  Data Review: I have personally reviewed the following laboratory data and studies,  CBC: Recent Labs  Lab 01/07/20 0129 01/07/20 0944 01/08/20 0600  WBC 23.7* 22.8* 23.0*  NEUTROABS 19.6* 18.9* 19.0*  HGB 10.3*  10.2* 9.7*  HCT 29.7* 30.0* 28.0*  MCV 90.3 91.5 90.6  PLT 243 250 0000000   Basic Metabolic Panel: Recent Labs  Lab 01/07/20 0129 01/07/20 0944 01/07/20 0946 01/08/20 0600  NA 133* 134* 133* 134*  K 2.4* 3.1* 3.1* 3.1*  CL 93* 96* 97* 97*  CO2 28 26 29 27   GLUCOSE 93 87 92 95  BUN 11 10 10 7   CREATININE 0.84 0.67 0.64 0.57*  CALCIUM 7.9* 7.6* 7.6* 7.8*  MG 1.9 1.6*  --  1.5*  PHOS  --  3.0  --  1.7*   Liver Function Tests: Recent Labs  Lab 01/07/20 0129 01/07/20 0944 01/08/20 0600  AST 70* 65* 62*  ALT 26 23 22   ALKPHOS 115 101 98  BILITOT 9.3* 8.4* 8.1*  PROT 6.2* 5.6* 5.5*  ALBUMIN 1.9* 1.7* 1.8*   Recent Labs  Lab 01/07/20 0129  LIPASE 19   Recent Labs  Lab 01/07/20 0129  AMMONIA 69*   Cardiac Enzymes: No results for input(s): CKTOTAL, CKMB, CKMBINDEX, TROPONINI in the last 168 hours. BNP (last 3 results) Recent Labs    01/07/20 1345  BNP 211.4*    ProBNP (last 3 results) No results for input(s): PROBNP in the last 8760 hours.  CBG: No results for input(s): GLUCAP in the last 168 hours. Recent Results (from the past 240 hour(s))  SARS CORONAVIRUS 2 (TAT 6-24 HRS) Nasopharyngeal Nasopharyngeal Swab     Status: None   Collection Time: 01/07/20  2:25 AM   Specimen: Nasopharyngeal Swab  Result Value Ref Range Status   SARS Coronavirus 2 NEGATIVE NEGATIVE Final    Comment: (NOTE) SARS-CoV-2 target nucleic acids are NOT DETECTED. The SARS-CoV-2 RNA is generally detectable in upper and lower respiratory specimens during the acute phase of infection. Negative results do not preclude SARS-CoV-2 infection, do not rule out co-infections with other pathogens, and should not be used as the sole basis for treatment or other patient management decisions. Negative results must be combined with clinical observations, patient history, and epidemiological information. The expected result is Negative. Fact Sheet for  Patients: SugarRoll.be Fact Sheet for Healthcare Providers: https://www.woods-mathews.com/ This test is not yet approved or cleared by the [place] FDA and  has been authorized for detection and/or diagnosis of SARS-CoV-2 by FDA under an Emergency Use Authorization (EUA). This EUA will remain  in effect (meaning this test can be used) for the duration of the COVID-19 declaration under Section 56 4(b)(1) of the Act, 21 U.S.C. section 360bbb-3(b)(1), unless the authorization is terminated or revoked sooner. Performed at [facility], [place] [address] [place], [place] [address]   Body fluid culture     Status: None (Preliminary result)   Collection Time: 01/07/20  5:53 AM   Specimen: Peritoneal Cavity; Peritoneal Fluid  Result Value Ref Range Status   Specimen Description PERITONEAL CAVITY  Final   Special Requests NONE  Final   Gram Stain   Final    WBC PRESENT,BOTH PMN AND MONONUCLEAR NO ORGANISMS SEEN Gram Stain Report Called to,Read Back By and Verified WithMee Hives @ [phone] 01/07/2020 Performed at [facility], [place] [address], [place], [place] [address]    Culture PENDING  Incomplete   Report Status PENDING  Incomplete  Culture, blood (routine x 2)     Status: None (Preliminary result)   Collection Time: 01/07/20  9:44 AM   Specimen: BLOOD RIGHT HAND  Result Value Ref Range Status   Specimen Description   Final    BLOOD RIGHT HAND Performed at [place] [place] [address], [place], [place] [address]    Special Requests   Final    BOTTLES DRAWN AEROBIC ONLY Blood Culture results may not be optimal due to an inadequate volume of blood received in culture bottles Performed at [facility] [address] [place], [place] [address]    Culture   Final    NO GROWTH < 24 HOURS Performed at [place] [address], [place], [place] [address]    Report Status  PENDING  Incomplete  Culture, blood (routine x 2)     Status: None (Preliminary result)   Collection Time: 01/07/20  9:46 AM   Specimen: BLOOD  Result Value Ref Range Status   Specimen Description   Final    BLOOD LEFT ARM Performed at [place] [address] [place], [place] [address]    Special Requests   Final    BOTTLES DRAWN AEROBIC ONLY BACTEROIDES CACCAE Performed at [facility], [place] [address] [place], [place] [address]    Culture  Final    NO GROWTH < 24 HOURS Performed at [facility], [place] [address], [place], [place] [address]    Report Status PENDING  Incomplete     Studies: DG Abdomen Acute W/Chest  Result Date: 01/07/2020 CLINICAL DATA:  Abdominal pain. EXAM: DG ABDOMEN ACUTE W/ 1V CHEST COMPARISON:  CT dated December 13, 2019 FINDINGS: The bowel gas pattern is nonspecific. There are no air-fluid levels. The bowel appears to be centrally located which is suggestive of underlying ascites. There is hepatosplenomegaly. There are heterogeneous airspace opacities at the lung bases bilaterally which appear to have worsened since the prior CT. There is no pneumothorax. The heart size is stable. Aortic calcifications are noted. IMPRESSION: 1. Nonspecific and nonobstructive bowel gas pattern. 2. Hepatosplenomegaly with probable ascites. 3. Worsening bibasilar airspace opacities which may represent atelectasis, pneumonia, or aspiration. Electronically Signed   By: Constance Holster M.D.   On: 01/07/2020 01:23   ECHOCARDIOGRAM COMPLETE  Result Date: 01/07/2020    ECHOCARDIOGRAM REPORT   Patient Name:   JAHSIER RITTENOUR Iseminger Date of Exam: 01/07/2020 Medical Rec #:  [id number]     Height:       72.0 in Accession #:    [id number]    Weight:       125.0 lb Date of Birth:  11/22/1965     BSA:          1.745 m Patient Age:    49 years      BP:           99/64 mmHg Patient Gender: M             HR:           78 bpm. Exam Location:  Inpatient Procedure: 2D  Echo Indications:    414.8 cardiomyopathy  History:        Patient has prior history of Echocardiogram examinations, most                 recent 11/08/2018. Risk Factors:Hypertension and Current Smoker.                 ETOH Abuse.  Sonographer:    Jannett Celestine RDCS (AE) Referring Phys: [id number] [place] Comments: Suboptimal subcostal window. IMPRESSIONS  1. Left ventricular ejection fraction, by estimation, is 60 to 65%. The left ventricle has normal function. The left ventricle has no regional wall motion abnormalities. Left ventricular diastolic parameters were normal.  2. Right ventricular systolic function is normal. The right ventricular size is normal.  3. The mitral valve is normal in structure. No evidence of mitral valve regurgitation. No evidence of mitral stenosis.  4. The aortic valve is normal in structure. Aortic valve regurgitation is not visualized. No aortic stenosis is present.  5. The inferior vena cava is normal in size with greater than 50% respiratory variability, suggesting right atrial pressure of 3 mmHg. Comparison(s): Prior images unable to be directly viewed, comparison made by report only. Changes from prior study are noted. The left ventricular function has improved. FINDINGS  Left Ventricle: Left ventricular ejection fraction, by estimation, is 60 to 65%. The left ventricle has normal function. The left ventricle has no regional wall motion abnormalities. The left ventricular internal cavity size was normal in size. There is  no left ventricular hypertrophy. Left ventricular diastolic parameters were normal. Normal left ventricular filling pressure. Right Ventricle: The right ventricular size is normal. No increase in right ventricular wall thickness. Right ventricular  systolic function is normal. Left Atrium: Left atrial size was normal in size. Right Atrium: Right atrial size was normal in size. Pericardium: There is no evidence of pericardial effusion. Mitral Valve:  The mitral valve is normal in structure. Normal mobility of the mitral valve leaflets. No evidence of mitral valve regurgitation. No evidence of mitral valve stenosis. Tricuspid Valve: The tricuspid valve is normal in structure. Tricuspid valve regurgitation is not demonstrated. No evidence of tricuspid stenosis. Aortic Valve: The aortic valve is normal in structure. Aortic valve regurgitation is not visualized. No aortic stenosis is present. Pulmonic Valve: The pulmonic valve was normal in structure. Pulmonic valve regurgitation is not visualized. No evidence of pulmonic stenosis. Aorta: The aortic root is normal in size and structure. Venous: The inferior vena cava is normal in size with greater than 50% respiratory variability, suggesting right atrial pressure of 3 mmHg. IAS/Shunts: No atrial level shunt detected by color flow Doppler.  LEFT VENTRICLE PLAX 2D LVIDd:         3.80 cm  Diastology LVIDs:         2.50 cm  LV e' lateral:   14.90 cm/s LV PW:         0.90 cm  LV E/e' lateral: 6.2 LV IVS:        0.70 cm  LV e' medial:    12.10 cm/s LVOT diam:     1.90 cm  LV E/e' medial:  7.6 LV SV:         72 LV SV Index:   41 LVOT Area:     2.84 cm  RIGHT VENTRICLE RV S prime:     13.90 cm/s TAPSE (M-mode): 2.5 cm LEFT ATRIUM             Index       RIGHT ATRIUM           Index LA diam:        3.80 cm 2.18 cm/m  RA Area:     13.60 cm LA Vol (A2C):   39.5 ml 22.64 ml/m RA Volume:   31.10 ml  17.82 ml/m LA Vol (A4C):   41.0 ml 23.50 ml/m LA Biplane Vol: 41.0 ml 23.50 ml/m  AORTIC VALVE LVOT Vmax:   139.00 cm/s LVOT Vmean:  104.000 cm/s LVOT VTI:    0.254 m  AORTA Ao Root diam: 3.20 cm MITRAL VALVE MV Area (PHT): 5.75 cm    SHUNTS MV Decel Time: 132 msec    Systemic VTI:  0.25 m MV E velocity: 92.40 cm/s  Systemic Diam: 1.90 cm MV A velocity: 77.10 cm/s MV E/A ratio:  1.20 Mihai Croitoru MD Electronically signed by Sanda Klein MD Signature Date/Time: 01/07/2020/1:49:36 PM    Final       Flora Lipps,  MD  [facility] 01/08/2020

## 2020-01-08 NOTE — Progress Notes (Signed)
Pt pain uncontolled overnight, 1 time Fentayl dose ordered for pain in abdomen, given. Pt also c/o pain to L shoulder, chronic pain 5/10. Pt requested pain patch. On call notified. Will continue to monitor.

## 2020-01-08 NOTE — Progress Notes (Addendum)
Progress Note  Patient Name: Richard [name] Date of Encounter: 01/08/2020  Primary Cardiologist: Jenkins Rouge, MD   Subjective   Plan for IR paracentesis this morning. Cardiac CT today. Patient unable to sleep well. No chest pain. Feels mildly sob.   Inpatient Medications    Scheduled Meds: . fentaNYL (SUBLIMAZE) injection  25 mcg Intravenous Q3H while awake  . folic acid  1 mg Oral Daily  . ivabradine  7.5 mg Oral Once  . lidocaine  1 patch Transdermal Q24H  . multivitamin with minerals  1 tablet Oral Daily  . nicotine  21 mg Transdermal Daily  . pantoprazole  40 mg Oral BID AC  . potassium chloride  40 mEq Oral Once  . sodium chloride flush  3 mL Intravenous Q12H  . sodium chloride flush  3 mL Intravenous Q12H  . thiamine  100 mg Oral Daily   Continuous Infusions: . sodium chloride    . albumin human    . cefTRIAXone (ROCEPHIN)  IV Stopped (01/07/20 2314)   PRN Meds: sodium chloride, sodium chloride flush   Vital Signs    Vitals:   01/07/20 1817 01/07/20 2013 01/08/20 0447 01/08/20 0457  BP: 103/63 107/60 93/63   Pulse: 90 92 90   Resp: 18 20 20    Temp: 98 F (36.7 C) 98.4 F (36.9 C) 98.2 F (36.8 C)   TempSrc:      SpO2: 94% 93% 93%   Weight:    65.5 kg  Height:        Intake/Output Summary (Last 24 hours) at 01/08/2020 0736 Last data filed at 01/08/2020 0425 Gross per 24 hour  Intake 1180.72 ml  Output 475 ml  Net 705.72 ml   Last 3 Weights 01/08/2020 01/06/2020 12/14/2019  Weight (lbs) 144 lb 6.4 oz 125 lb 117 lb 4.6 oz  Weight (kg) 65.5 kg 56.7 kg 53.2 kg      Telemetry    NSR, HR 80-90s - Personally Reviewed  ECG    No new - Personally Reviewed  Physical Exam   GEN: No acute distress.   Neck: + JVD Cardiac: RRR, no murmurs, rubs, or gallops.  Respiratory: Clear to auscultation bilaterally. GI: firm, nontender, +distended  MS: 1+ edema; No deformity. Neuro:  Nonfocal  Psych: Normal affect   Labs    High Sensitivity Troponin:    Recent Labs  Lab 12/13/19 0001 12/13/19 0205 01/07/20 0946 01/07/20 1156 01/07/20 1345  TROPONINIHS 5 6 3 3 4       Chemistry Recent Labs  Lab 01/07/20 0129 01/07/20 0129 01/07/20 0944 01/07/20 0946 01/08/20 0600  NA 133*   < > 134* 133* 134*  K 2.4*   < > 3.1* 3.1* 3.1*  CL 93*   < > 96* 97* 97*  CO2 28   < > 26 29 27   GLUCOSE 93   < > 87 92 95  BUN 11   < > 10 10 7   CREATININE 0.84   < > 0.67 0.64 0.57*  CALCIUM 7.9*   < > 7.6* 7.6* 7.8*  PROT 6.2*  --  5.6*  --  5.5*  ALBUMIN 1.9*  --  1.7*  --  1.8*  AST 70*  --  65*  --  62*  ALT 26  --  23  --  22  ALKPHOS 115  --  101  --  98  BILITOT 9.3*  --  8.4*  --  8.1*  GFRNONAA >60   < > >  60 >60 >60  GFRAA >60   < > >60 >60 >60  ANIONGAP 12   < > 12 7 10    < > = values in this interval not displayed.     Hematology Recent Labs  Lab 01/07/20 0129 01/07/20 0944 01/08/20 0600  WBC 23.7* 22.8* 23.0*  RBC 3.29* 3.28* 3.09*  HGB 10.3* 10.2* 9.7*  HCT 29.7* 30.0* 28.0*  MCV 90.3 91.5 90.6  MCH 31.3 31.1 31.4  MCHC 34.7 34.0 34.6  RDW 19.1* 19.5* 19.2*  PLT 243 250 254    BNP Recent Labs  Lab 01/07/20 1345  BNP 211.4*     DDimer No results for input(s): DDIMER in the last 168 hours.   Radiology    DG Abdomen Acute W/Chest  Result Date: 01/07/2020 CLINICAL DATA:  Abdominal pain. EXAM: DG ABDOMEN ACUTE W/ 1V CHEST COMPARISON:  CT dated December 13, 2019 FINDINGS: The bowel gas pattern is nonspecific. There are no air-fluid levels. The bowel appears to be centrally located which is suggestive of underlying ascites. There is hepatosplenomegaly. There are heterogeneous airspace opacities at the lung bases bilaterally which appear to have worsened since the prior CT. There is no pneumothorax. The heart size is stable. Aortic calcifications are noted. IMPRESSION: 1. Nonspecific and nonobstructive bowel gas pattern. 2. Hepatosplenomegaly with probable ascites. 3. Worsening bibasilar airspace opacities which may  represent atelectasis, pneumonia, or aspiration. Electronically Signed   By: Constance Holster M.D.   On: 01/07/2020 01:23   ECHOCARDIOGRAM COMPLETE  Result Date: 01/07/2020    ECHOCARDIOGRAM REPORT   Patient Name:   Richard [name] Date of Exam: 01/07/2020 Medical Rec #:  [id number]     Height:       72.0 in Accession #:    [id number]    Weight:       125.0 lb Date of Birth:  Dec 14, 1965     BSA:          1.745 m Patient Age:    54 years      BP:           99/64 mmHg Patient Gender: M             HR:           78 bpm. Exam Location:  Inpatient Procedure: 2D Echo Indications:    414.8 cardiomyopathy  History:        Patient has prior history of Echocardiogram examinations, most                 recent 11/08/2018. Risk Factors:Hypertension and Current Smoker.                 ETOH Abuse.  Sonographer:    Jannett Celestine RDCS (AE) Referring Phys: [id number] [place] Comments: Suboptimal subcostal window. IMPRESSIONS  1. Left ventricular ejection fraction, by estimation, is 60 to 65%. The left ventricle has normal function. The left ventricle has no regional wall motion abnormalities. Left ventricular diastolic parameters were normal.  2. Right ventricular systolic function is normal. The right ventricular size is normal.  3. The mitral valve is normal in structure. No evidence of mitral valve regurgitation. No evidence of mitral stenosis.  4. The aortic valve is normal in structure. Aortic valve regurgitation is not visualized. No aortic stenosis is present.  5. The inferior vena cava is normal in size with greater than 50% respiratory variability, suggesting right atrial pressure of 3 mmHg. Comparison(s): Prior images unable to be  directly viewed, comparison made by report only. Changes from prior study are noted. The left ventricular function has improved. FINDINGS  Left Ventricle: Left ventricular ejection fraction, by estimation, is 60 to 65%. The left ventricle has normal function. The left ventricle has  no regional wall motion abnormalities. The left ventricular internal cavity size was normal in size. There is  no left ventricular hypertrophy. Left ventricular diastolic parameters were normal. Normal left ventricular filling pressure. Right Ventricle: The right ventricular size is normal. No increase in right ventricular wall thickness. Right ventricular systolic function is normal. Left Atrium: Left atrial size was normal in size. Right Atrium: Right atrial size was normal in size. Pericardium: There is no evidence of pericardial effusion. Mitral Valve: The mitral valve is normal in structure. Normal mobility of the mitral valve leaflets. No evidence of mitral valve regurgitation. No evidence of mitral valve stenosis. Tricuspid Valve: The tricuspid valve is normal in structure. Tricuspid valve regurgitation is not demonstrated. No evidence of tricuspid stenosis. Aortic Valve: The aortic valve is normal in structure. Aortic valve regurgitation is not visualized. No aortic stenosis is present. Pulmonic Valve: The pulmonic valve was normal in structure. Pulmonic valve regurgitation is not visualized. No evidence of pulmonic stenosis. Aorta: The aortic root is normal in size and structure. Venous: The inferior vena cava is normal in size with greater than 50% respiratory variability, suggesting right atrial pressure of 3 mmHg. IAS/Shunts: No atrial level shunt detected by color flow Doppler.  LEFT VENTRICLE PLAX 2D LVIDd:         3.80 cm  Diastology LVIDs:         2.50 cm  LV e' lateral:   14.90 cm/s LV PW:         0.90 cm  LV E/e' lateral: 6.2 LV IVS:        0.70 cm  LV e' medial:    12.10 cm/s LVOT diam:     1.90 cm  LV E/e' medial:  7.6 LV SV:         72 LV SV Index:   41 LVOT Area:     2.84 cm  RIGHT VENTRICLE RV S prime:     13.90 cm/s TAPSE (M-mode): 2.5 cm LEFT ATRIUM             Index       RIGHT ATRIUM           Index LA diam:        3.80 cm 2.18 cm/m  RA Area:     13.60 cm LA Vol (A2C):   39.5 ml 22.64  ml/m RA Volume:   31.10 ml  17.82 ml/m LA Vol (A4C):   41.0 ml 23.50 ml/m LA Biplane Vol: 41.0 ml 23.50 ml/m  AORTIC VALVE LVOT Vmax:   139.00 cm/s LVOT Vmean:  104.000 cm/s LVOT VTI:    0.254 m  AORTA Ao Root diam: 3.20 cm MITRAL VALVE MV Area (PHT): 5.75 cm    SHUNTS MV Decel Time: 132 msec    Systemic VTI:  0.25 m MV E velocity: 92.40 cm/s  Systemic Diam: 1.90 cm MV A velocity: 77.10 cm/s MV E/A ratio:  1.20 Mihai Croitoru MD Electronically signed by Sanda Klein MD Signature Date/Time: 01/07/2020/1:49:36 PM    Final     Cardiac Studies   Echo 11/2018 1. The left ventricle has moderate-severely reduced systolic function of  99991111. The cavity size was normal. There is no increased left ventricular  wall thickness. Echo  evidence of impaired diastolic relaxation Left  ventrical global hypokinesis without  regional wall motion abnormalities.  2. The right ventricle has mildly reduced systolic function. The cavity  was mildly enlarged. There is no increase in right ventricular wall  thickness.  3. The mitral valve is normal in structure. No evidence of mitral valve  stenosis. No significant mitral regurgitation.  4. The tricuspid valve is normal in structure.  5. The aortic valve is tricuspid. No aortic stenosis.  6. The aortic root and ascending aorta are normal in size and structure.  7. The inferior vena cava is normal in size with greater than 50%  respiratory variability.  8. No complete TR doppler jet so unable to estimate PA systolic pressure.    Patient Profile     54 y.o. male with a hx of alcohol abuse and withdrawal, colon cancer status post surgery 2007 without follow-up, PUD, GERD, alcoholic pancreatitis, chronic abdominal pain, hypertension, SVT, chronic systolic heart failure (EF 30-35% in 11/2018) who is being seen today for the evaluation of heart failure and prolonged Qt  Assessment & Plan   Alcoholic hepatitis/history of alcohol use/SBP Patient presented  with abdominal pain and distention the last 3 to 4 weeks.  Also reported progressive bilateral lower leg edema.  Patient denies alcohol use since he was discharged 3 to 4 weeks ago. -LFTs mildly elevated with leukocytosis - INR 2.1 - Plan for IR paracentesis this AM - Tx per IM  Chronic systolic and diastolic Heart Failure/Cardiomyopathy - EF 30-35%, no RWMA, impaired relaxation with global hypokinesis, mildly reduced RV function - expect CM secondary to alcohol use/tachycardia/untreated HTN - patient was seen 11/2018 for SVT and started on losartan and Coreg. Does not appear to be on his home med list.  - lower leg swelling likely from low albumin which is being supplemented. Level 1.8 today - BNP only 211 - Hold on restarting Losartan and coreg due to hypotension - creatinine normal - Plan for ischemic eval with cardiac CT today. Ivabradine ordered for to lower HR. NPO for procedure  Hypokalemia/Hypomagnesemia - K+2.4 on admission>>supplemented - today 3.1. will supplement - goal >4 - Mag 1.5. goal >2. supplement  Tobacco use - recommend cessation  Prolonged QT interval - QTc 518 ms in the ED - 633 ms today. After review QTc is likely around 400-500 ms - replete potassium - continue cardiac monitoring - avoid QT prolonging agents  For questions or updates, please contact [place] Please consult [email or web address] for contact info under     Signed, Cadence Ninfa Meeker, PA-C  01/08/2020, 7:36 AM     Patient seen and independently examined with Cadence Kathlen Mody, PA. We discussed all aspects of the encounter. I agree with the assessment and plan as stated above.  Denies any chest pain or pressures.   [id number] cachetic and ill appearing HEENT: Normal NECK: No JVD; No carotid bruits LYMPHATICS: No lymphadenopathy CARDIAC:RRR, no murmurs, rubs, gallops RESPIRATORY:  Clear to auscultation without rales, wheezing or rhonchi  ABDOMEN: Soft, non-tender,  non-distended MUSCULOSKELETAL:  No edema; No deformity  SKIN: Warm and dry NEUROLOGIC:  Alert and oriented x 3 PSYCHIATRIC:  Normal affect   Has minimal edema likely related more to 3rd spacing from low albumin than from CHF.  He put out 475cc yesterday and is net + 905cc.  Creatinine stable at 0.57 and K+ remains low at 3.1.  Mag also low at 1.5.  Replete lytes to keep K+>4 and Mag>2.  Appears anxious today ?  ETOH withdrawal.    Plan for ischemic workup of his DCM with coronary CTA  to rule out CAD. He appears auto anticoagulated with INR 2.1 related to underlying liver dz from ETOH and therefore will avoid cath.   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  BP too soft to add guideline directed medical therapy for CHF at this time.  Will get Ivabradine for HR control  for coronary CTA due to soft BP instead of lopressor. His HR it too high today likely related to anxiety and possible withdrawal so will postpone CTA until tomorrow and try to treat possible ETOH withdrawal. Make NPO after MN .  He does not appear volume overloaded on exam.   I have spent a total of 35 minutes with patient reviewing 2D echo , telemetry, EKGs, labs and examining patient as well as establishing an assessment and plan that was discussed with the patient.  > 50% of time was spent in direct patient care.    Signed:  Fransico Him, MD [facility] 01/08/2020

## 2020-01-09 ENCOUNTER — Ambulatory Visit (HOSPITAL_COMMUNITY)
Admission: RE | Admit: 2020-01-09 | Discharge: 2020-01-09 | Disposition: A | Discharge status: Home / Self Care | Payer: Self-pay | Source: Ambulatory Visit | Attending: Medical | Admitting: Medical

## 2020-01-09 DIAGNOSIS — I255 Ischemic cardiomyopathy: Secondary | ICD-10-CM

## 2020-01-09 DIAGNOSIS — K7031 Alcoholic cirrhosis of liver with ascites: Secondary | ICD-10-CM

## 2020-01-09 DIAGNOSIS — R079 Chest pain, unspecified: Secondary | ICD-10-CM

## 2020-01-09 LAB — CBC WITH DIFFERENTIAL/PLATELET
Abs Immature Granulocytes: 0.26 10*3/uL — ABNORMAL HIGH (ref 0.00–0.07)
Basophils Absolute: 0.2 10*3/uL — ABNORMAL HIGH (ref 0.0–0.1)
Basophils Relative: 1 %
Eosinophils Absolute: 0.6 10*3/uL — ABNORMAL HIGH (ref 0.0–0.5)
Eosinophils Relative: 3 %
HCT: 27.1 % — ABNORMAL LOW (ref 39.0–52.0)
Hemoglobin: 9.3 g/dL — ABNORMAL LOW (ref 13.0–17.0)
Immature Granulocytes: 1 %
Lymphocytes Relative: 10 %
Lymphs Abs: 2.2 10*3/uL (ref 0.7–4.0)
MCH: 31.7 pg (ref 26.0–34.0)
MCHC: 34.3 g/dL (ref 30.0–36.0)
MCV: 92.5 fL (ref 80.0–100.0)
Monocytes Absolute: 1.6 10*3/uL — ABNORMAL HIGH (ref 0.1–1.0)
Monocytes Relative: 7 %
Neutro Abs: 18 10*3/uL — ABNORMAL HIGH (ref 1.7–7.7)
Neutrophils Relative %: 78 %
Platelets: 225 10*3/uL (ref 150–400)
RBC: 2.93 MIL/uL — ABNORMAL LOW (ref 4.22–5.81)
RDW: 19.3 % — ABNORMAL HIGH (ref 11.5–15.5)
WBC: 22.8 10*3/uL — ABNORMAL HIGH (ref 4.0–10.5)
nRBC: 0 % (ref 0.0–0.2)

## 2020-01-09 LAB — NM MYOCAR MULTI W/SPECT W/WALL MOTION / EF
Estimated workload: 1 METS
Exercise duration (min): 5 min
Exercise duration (sec): 14 s
MPHR: 168 {beats}/min
Peak HR: 80 {beats}/min
Percent HR: 47 %
Rest HR: 70 {beats}/min

## 2020-01-09 LAB — COMPREHENSIVE METABOLIC PANEL
ALT: 22 U/L (ref 0–44)
AST: 56 U/L — ABNORMAL HIGH (ref 15–41)
Albumin: 1.8 g/dL — ABNORMAL LOW (ref 3.5–5.0)
Alkaline Phosphatase: 89 U/L (ref 38–126)
Anion gap: 7 (ref 5–15)
BUN: 6 mg/dL (ref 6–20)
CO2: 27 mmol/L (ref 22–32)
Calcium: 7.8 mg/dL — ABNORMAL LOW (ref 8.9–10.3)
Chloride: 97 mmol/L — ABNORMAL LOW (ref 98–111)
Creatinine, Ser: 0.54 mg/dL — ABNORMAL LOW (ref 0.61–1.24)
GFR calc Af Amer: >60 mL/min (ref >60)
GFR calc non Af Amer: >60 mL/min (ref >60)
Glucose, Bld: 92 mg/dL (ref 70–99)
Potassium: 3.7 mmol/L (ref 3.5–5.1)
Sodium: 131 mmol/L — ABNORMAL LOW (ref 135–145)
Total Bilirubin: 7.1 mg/dL — ABNORMAL HIGH (ref 0.3–1.2)
Total Protein: 5.1 g/dL — ABNORMAL LOW (ref 6.5–8.1)

## 2020-01-09 LAB — PROTIME-INR
INR: 2.2 — ABNORMAL HIGH (ref 0.8–1.2)
Prothrombin Time: 24.4 seconds — ABNORMAL HIGH (ref 11.4–15.2)

## 2020-01-09 LAB — PHOSPHORUS: Phosphorus: 1.6 mg/dL — ABNORMAL LOW (ref 2.5–4.6)

## 2020-01-09 LAB — MAGNESIUM: Magnesium: 1.7 mg/dL (ref 1.7–2.4)

## 2020-01-09 MED ORDER — PREDNISOLONE 5 MG PO TABS
40.0000 mg | ORAL_TABLET | Freq: Every day | ORAL | Status: DC
  Administered 2020-01-09 – 2020-01-16 (×8): 40 mg via ORAL
  Filled 2020-01-09 (×8): qty 8

## 2020-01-09 MED ORDER — REGADENOSON 0.4 MG/5ML IV SOLN
INTRAVENOUS | Status: AC
  Filled 2020-01-09: qty 5

## 2020-01-09 MED ORDER — TECHNETIUM TC 99M TETROFOSMIN IV KIT
10.2000 | PACK | Freq: Once | INTRAVENOUS | Status: AC | PRN
  Administered 2020-01-09: 10.2 via INTRAVENOUS

## 2020-01-09 MED ORDER — SODIUM PHOSPHATES 45 MMOLE/15ML IV SOLN
20.0000 mmol | Freq: Once | INTRAVENOUS | Status: AC
  Administered 2020-01-09: 10:00:00 20 mmol via INTRAVENOUS
  Filled 2020-01-09: qty 6.67

## 2020-01-09 MED ORDER — PHYTONADIONE 5 MG PO TABS
5.0000 mg | ORAL_TABLET | Freq: Every day | ORAL | Status: AC
  Administered 2020-01-09 – 2020-01-11 (×3): 5 mg via ORAL
  Filled 2020-01-09 (×3): qty 1

## 2020-01-09 MED ORDER — REGADENOSON 0.4 MG/5ML IV SOLN
0.4000 mg | Freq: Once | INTRAVENOUS | Status: AC
  Administered 2020-01-09: 0.4 mg via INTRAVENOUS
  Filled 2020-01-09: qty 5

## 2020-01-09 MED ORDER — HYDROCODONE-ACETAMINOPHEN 7.5-325 MG PO TABS
1.0000 | ORAL_TABLET | Freq: Four times a day (QID) | ORAL | Status: AC | PRN
  Administered 2020-01-09 – 2020-01-10 (×2): 1 via ORAL
  Filled 2020-01-09 (×2): qty 1

## 2020-01-09 MED ORDER — TECHNETIUM TC 99M TETROFOSMIN IV KIT
30.3000 | PACK | Freq: Once | INTRAVENOUS | Status: AC | PRN
  Administered 2020-01-09: 30.3 via INTRAVENOUS

## 2020-01-09 NOTE — Progress Notes (Signed)
Progress Note  Patient Name: Yeshaya Fornash Date of Encounter: 01/09/2020  Primary Cardiologist: Jenkins Rouge, MD   Subjective   IR paracentesis yesterday yielded 3.1L yellow fluid. Plan for cardiac CT today. Patient is overall feeling better. No chest pain.   Inpatient Medications    Scheduled Meds: . fentaNYL (SUBLIMAZE) injection  25 mcg Intravenous Q3H while awake  . folic acid  1 mg Oral Daily  . ivabradine  7.5 mg Oral Once  . lactulose  10 g Oral BID  . magnesium oxide  400 mg Oral BID  . multivitamin with minerals  1 tablet Oral Daily  . nicotine  21 mg Transdermal Daily  . pantoprazole  40 mg Oral BID AC  . potassium chloride  40 mEq Oral Daily  . sodium chloride flush  3 mL Intravenous Q12H  . sodium chloride flush  3 mL Intravenous Q12H  . thiamine  100 mg Oral Daily   Continuous Infusions: . sodium chloride    . cefTRIAXone (ROCEPHIN)  IV 2 g (01/08/20 2020)  . sodium phosphate  Dextrose 5% IVPB     PRN Meds: sodium chloride, LORazepam, sodium chloride flush   Vital Signs    Vitals:   01/08/20 1400 01/08/20 2106 01/09/20 0441 01/09/20 0615  BP: 99/62 107/68 104/60   Pulse:  82 82   Resp:  20 17   Temp:  98.8 F (37.1 C) 98.3 F (36.8 C)   TempSrc:      SpO2:  90% 91%   Weight:    54.7 kg  Height:        Intake/Output Summary (Last 24 hours) at 01/09/2020 0736 Last data filed at 01/09/2020 0127 Gross per 24 hour  Intake 360 ml  Output 420 ml  Net -60 ml   Last 3 Weights 01/09/2020 01/08/2020 01/06/2020  Weight (lbs) 120 lb 9.6 oz 144 lb 6.4 oz 125 lb  Weight (kg) 54.704 kg 65.5 kg 56.7 kg      Telemetry    NSR, HR 80, no other arrhythmias noted - Personally Reviewed  ECG    No new - Personally Reviewed  Physical Exam   GEN: No acute distress.   Neck: + JVD Cardiac: RRR, no murmurs, rubs, or gallops.  Respiratory: Clear to auscultation bilaterally. GI: Soft, nontender, non-distended  MS: 1+ edema; No deformity. Neuro:  Nonfocal   Psych: Normal affect   Labs    High Sensitivity Troponin:   Recent Labs  Lab 12/13/19 0001 12/13/19 0205 01/07/20 0946 01/07/20 1156 01/07/20 1345  TROPONINIHS 5 6 3 3 4       Chemistry Recent Labs  Lab 01/07/20 0944 01/07/20 0944 01/07/20 0946 01/08/20 0600 01/09/20 0603  NA 134*   < > 133* 134* 131*  K 3.1*   < > 3.1* 3.1* 3.7  CL 96*   < > 97* 97* 97*  CO2 26   < > 29 27 27   GLUCOSE 87   < > 92 95 92  BUN 10   < > 10 7 6   CREATININE 0.67   < > 0.64 0.57* 0.54*  CALCIUM 7.6*   < > 7.6* 7.8* 7.8*  PROT 5.6*  --   --  5.5* 5.1*  ALBUMIN 1.7*  --   --  1.8* 1.8*  AST 65*  --   --  62* 56*  ALT 23  --   --  22 22  ALKPHOS 101  --   --  98 89  BILITOT  8.4*  --   --  8.1* 7.1*  GFRNONAA >60   < > >60 >60 >60  GFRAA >60   < > >60 >60 >60  ANIONGAP 12   < > 7 10 7    < > = values in this interval not displayed.     Hematology Recent Labs  Lab 01/07/20 0944 01/08/20 0600 01/09/20 0603  WBC 22.8* 23.0* 22.8*  RBC 3.28* 3.09* 2.93*  HGB 10.2* 9.7* 9.3*  HCT 30.0* 28.0* 27.1*  MCV 91.5 90.6 92.5  MCH 31.1 31.4 31.7  MCHC 34.0 34.6 34.3  RDW 19.5* 19.2* 19.3*  PLT 250 254 225    BNP Recent Labs  Lab 01/07/20 1345  BNP 211.4*     DDimer No results for input(s): DDIMER in the last 168 hours.   Radiology    US Paracentesis  Result Date: 01/08/2020 INDICATION: Patient with history of alcoholic hepatitis, CHF, ascites. Request made for therapeutic paracentesis. EXAM: ULTRASOUND GUIDED THERAPEUTIC PARACENTESIS MEDICATIONS: None COMPLICATIONS: None immediate. PROCEDURE: Informed written consent was obtained from the patient after a discussion of the risks, benefits and alternatives to treatment. A timeout was performed prior to the initiation of the procedure. Initial ultrasound scanning demonstrates a moderate amount of ascites within the right lower abdominal quadrant. The right lower abdomen was prepped and draped in the usual sterile fashion. 1% lidocaine was  used for local anesthesia. Following this, a 19 gauge, 7-cm, Yueh catheter was introduced. An ultrasound image was saved for documentation purposes. The paracentesis was performed. The catheter was removed and a dressing was applied. The patient tolerated the procedure well without immediate post procedural complication. Patient received post-procedure intravenous albumin; see nursing notes for details. FINDINGS: A total of approximately 3.1 liters of yellow fluid was removed. IMPRESSION: Successful ultrasound-guided therapeutic paracentesis yielding 3.1 liters of peritoneal fluid. Read by: Rowe Robert, PA-C Electronically Signed   By: Jerilynn Mages.  Shick M.D.   On: 01/08/2020 15:16   ECHOCARDIOGRAM COMPLETE  Result Date: 01/07/2020    ECHOCARDIOGRAM REPORT   Patient Name:   KINGSLEE ZALES Date of Exam: 01/07/2020 Medical Rec #:  [id number]     Height:       72.0 in Accession #:    [id number]    Weight:       125.0 lb Date of Birth:  Oct 28, 1965     BSA:          1.745 m Patient Age:    54 years      BP:           99/64 mmHg Patient Gender: M             HR:           78 bpm. Exam Location:  Inpatient Procedure: 2D Echo Indications:    414.8 cardiomyopathy  History:        Patient has prior history of Echocardiogram examinations, most                 recent 11/08/2018. Risk Factors:Hypertension and Current Smoker.                 ETOH Abuse.  Sonographer:    Jannett Celestine RDCS (AE) Referring Phys: [id number] [place] Comments: Suboptimal subcostal window. IMPRESSIONS  1. Left ventricular ejection fraction, by estimation, is 60 to 65%. The left ventricle has normal function. The left ventricle has no regional wall motion abnormalities. Left ventricular diastolic parameters were normal.  2. Right  ventricular systolic function is normal. The right ventricular size is normal.  3. The mitral valve is normal in structure. No evidence of mitral valve regurgitation. No evidence of mitral stenosis.  4. The aortic valve is  normal in structure. Aortic valve regurgitation is not visualized. No aortic stenosis is present.  5. The inferior vena cava is normal in size with greater than 50% respiratory variability, suggesting right atrial pressure of 3 mmHg. Comparison(s): Prior images unable to be directly viewed, comparison made by report only. Changes from prior study are noted. The left ventricular function has improved. FINDINGS  Left Ventricle: Left ventricular ejection fraction, by estimation, is 60 to 65%. The left ventricle has normal function. The left ventricle has no regional wall motion abnormalities. The left ventricular internal cavity size was normal in size. There is  no left ventricular hypertrophy. Left ventricular diastolic parameters were normal. Normal left ventricular filling pressure. Right Ventricle: The right ventricular size is normal. No increase in right ventricular wall thickness. Right ventricular systolic function is normal. Left Atrium: Left atrial size was normal in size. Right Atrium: Right atrial size was normal in size. Pericardium: There is no evidence of pericardial effusion. Mitral Valve: The mitral valve is normal in structure. Normal mobility of the mitral valve leaflets. No evidence of mitral valve regurgitation. No evidence of mitral valve stenosis. Tricuspid Valve: The tricuspid valve is normal in structure. Tricuspid valve regurgitation is not demonstrated. No evidence of tricuspid stenosis. Aortic Valve: The aortic valve is normal in structure. Aortic valve regurgitation is not visualized. No aortic stenosis is present. Pulmonic Valve: The pulmonic valve was normal in structure. Pulmonic valve regurgitation is not visualized. No evidence of pulmonic stenosis. Aorta: The aortic root is normal in size and structure. Venous: The inferior vena cava is normal in size with greater than 50% respiratory variability, suggesting right atrial pressure of 3 mmHg. IAS/Shunts: No atrial level shunt detected  by color flow Doppler.  LEFT VENTRICLE PLAX 2D LVIDd:         3.80 cm  Diastology LVIDs:         2.50 cm  LV e' lateral:   14.90 cm/s LV PW:         0.90 cm  LV E/e' lateral: 6.2 LV IVS:        0.70 cm  LV e' medial:    12.10 cm/s LVOT diam:     1.90 cm  LV E/e' medial:  7.6 LV SV:         72 LV SV Index:   41 LVOT Area:     2.84 cm  RIGHT VENTRICLE RV S prime:     13.90 cm/s TAPSE (M-mode): 2.5 cm LEFT ATRIUM             Index       RIGHT ATRIUM           Index LA diam:        3.80 cm 2.18 cm/m  RA Area:     13.60 cm LA Vol (A2C):   39.5 ml 22.64 ml/m RA Volume:   31.10 ml  17.82 ml/m LA Vol (A4C):   41.0 ml 23.50 ml/m LA Biplane Vol: 41.0 ml 23.50 ml/m  AORTIC VALVE LVOT Vmax:   139.00 cm/s LVOT Vmean:  104.000 cm/s LVOT VTI:    0.254 m  AORTA Ao Root diam: 3.20 cm MITRAL VALVE MV Area (PHT): 5.75 cm    SHUNTS MV Decel Time: 132 msec  Systemic VTI:  0.25 m MV E velocity: 92.40 cm/s  Systemic Diam: 1.90 cm MV A velocity: 77.10 cm/s MV E/A ratio:  1.20 Dani Gobble Croitoru MD Electronically signed by Sanda Klein MD Signature Date/Time: 01/07/2020/1:49:36 PM    Final     Cardiac Studies   Echo 11/2018 1. The left ventricle has moderate-severely reduced systolic function of  [id number]. The cavity size was normal. There is no increased left ventricular  wall thickness. Echo evidence of impaired diastolic relaxation Left  ventrical global hypokinesis without  regional wall motion abnormalities.  2. The right ventricle has mildly reduced systolic function. The cavity  was mildly enlarged. There is no increase in right ventricular wall  thickness.  3. The mitral valve is normal in structure. No evidence of mitral valve  stenosis. No significant mitral regurgitation.  4. The tricuspid valve is normal in structure.  5. The aortic valve is tricuspid. No aortic stenosis.  6. The aortic root and ascending aorta are normal in size and structure.  7. The inferior vena cava is normal in size with greater  than 50%  respiratory variability.  8. No complete TR doppler jet so unable to estimate PA systolic pressure.  Patient Profile     54 y.o. male  with a hx of alcohol abuse and withdrawal, colon cancer status post surgery 2007 without follow-up, PUD, GERD, alcoholic pancreatitis, chronic abdominal pain, hypertension, SVT, chronic systolic heart failure (EF 30-35% in 11/2018)who is being seen today for the evaluation of heart failure and prolonged Qt  Assessment & Plan    Alcoholic hepatitis/history of alcohol use/SBP Patient presented with abdominal pain and distention the last 3 to 4 weeks. Also reported progressive bilateral lower leg edema.Patient denies alcohol use since he was discharged 3 to 4 weeks ago. - LFTs mildly elevatedwith leukocytosis - IR paracentesis yielded 3.1 L yellow fluid - Tx per IM  Chronic systolic and diastolic Heart Failure/Cardiomyopathy - EF 30-35%, no RWMA, impaired relaxation with global hypokinesis, mildly reduced RV function - expect CM secondary to alcohol use/tachycardia/untreated HTN - patient was seen 11/2018 for SVT and started on losartan and Coreg. - lower leg swelling likely from low albumin  Level 1.8 today>>recommend supplementation - BNP 211 -Hold on restarting Losartan and coreg due to hypotension>>unsure if we will be able to start this during admission. Might have to be OP - creatinine normal -Plan for ischemic eval with cardiac CT. Ivabradine for to lower HR. NPO for procedure  Hypokalemia/Hypomagnesemia - K+2.4 on admission>>supplemented - today 3.7. will supplement - goal >4 - Mag 1.7 goal >2. supplement  Tobacco use - recommend cessation  Prolonged QT interval - QTc 518 ms in the ED - 633 ms today. After review QTc is likely around 400-500 ms - replete potassium - continue cardiac monitoring - avoid QT prolonging agents  Anxiety - Ativan as needed  For questions or updates, please contact [place] Please  consult [email or web address] for contact info under        Signed, Cashe Gatt Ninfa Meeker, PA-C  01/09/2020, 7:36 AM

## 2020-01-09 NOTE — Progress Notes (Signed)
Unable to get heart rate down for cardiac CT 2 days in a row. Dr. Radford Pax will change to Myoview stress test. Patient still NPO. Spoke to [facility], can likely get it done today.   Aryssa Rosamond Kathlen Mody, PA-C

## 2020-01-09 NOTE — Consult Note (Signed)
Consultation  Referring Provider: [facility]/ Pokrel Primary Care Physician:  Antony Blackbird, MD Primary Gastroenterologist:  Dr.Perry -remote  Reason for Consultation: Decompensated liver disease, alcoholic hepatitis  HPI: Vyan Vanore is a 54 y.o. male, with history of colon cancer status post partial colectomy in 2007, he has history of previous EtOH related pancreatitis, peptic ulcer disease, hypertension and dilated cardiomyopathy with most recent EF at 30 to 35%. He had recent admission in mid March 2021 and was seen by GI at that time for GI bleeding.  He underwent EGD per Dr. 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  He also had colonoscopy at that same time which showed a few erosions in the sigmoid colon and and into and anastomosis, no polyps. Patient has long history of EtOH abuse, he was actively drinking at the time of his admission in March 2021 but says he has not had any alcohol over the past month. He was readmitted on 01/07/2020 with complaints of abdominal pain, distention and extremity edema with onset over the days prior to admission. Chest x-ray on admission showed worsening bibasilar airspace opacities possible atelectasis versus pneumonia, hepatosplenomegaly with probable ascites  CT of the abdomen and pelvis on 12/13/2019 shows bilateral lower lobe atelectasis, progressed hepatomegaly over the past year and new diffuse heterogeneous enhancement per imposed on chronic steatosis, he has distended gallbladder up to 6 cm, no pericholecystic inflammation, moderate to large volume of ascites He did not have any significant edema at that time and was not discharged home on any diuretics.  He was to follow-up with [facility].  Patient has been evaluated by cardiology since admission due to history of cardiomyopathy and is  scheduled for a CTA today to further evaluate.  He underwent paracentesis yesterday with removal of 3.1 L, cell counts not consistent with SBP.  Labs 01/08/2020-INR 2.1/pro time 23 Sodium 134, potassium 3.1 creatinine 0.5, albumin 1.9 T bili 8.1/AST 62/ALT 22 BNP 211  Patient says he is feeling better currently and abdomen not as tight since paracentesis.  Denies any shortness of breath today.  He is hungry. Says the extremity edema has improved as well, he thinks since he has been in bed with legs elevated. He is not currently on any diuretics       Past Medical History:  Diagnosis Date  . Acid reflux   . Colon cancer ([place])   . ETOH abuse   . HTN (hypertension)   . Pancreatitis 08/2018  . PUD (peptic ulcer disease)     Past Surgical History:  Procedure Laterality Date  . BIOPSY  12/14/2019   Procedure: BIOPSY;  Surgeon: Mauri Pole, MD;  Location: [facility];  Service: Endoscopy;;  . COLONOSCOPY WITH PROPOFOL N/A 12/14/2019   Procedure: COLONOSCOPY WITH PROPOFOL;  Surgeon: Mauri Pole, MD;  Location: [facility];  Service: Endoscopy;  Laterality: N/A;  . ESOPHAGOGASTRODUODENOSCOPY (EGD) WITH PROPOFOL N/A 12/14/2019   Procedure: ESOPHAGOGASTRODUODENOSCOPY (EGD) WITH PROPOFOL;  Surgeon: Mauri Pole, MD;  Location: [facility];  Service: Endoscopy;  Laterality: N/A;  . LAPAROSCOPIC SIGMOID COLECTOMY  2007  . POLYPECTOMY  12/14/2019   Procedure: POLYPECTOMY;  Surgeon: Mauri Pole, MD;  Location: [facility];  Service: Endoscopy;;    Prior to Admission medications   Medication Sig Start Date End Date Taking? Authorizing Provider  acetaminophen (TYLENOL) 500 MG tablet Take 1  tablet (500 mg total) by mouth every 6 (six) hours as needed for mild pain, moderate pain or headache. 12/14/19  Yes Hongalgi, Lenis Dickinson, MD  folic acid (FOLVITE) 1 MG tablet Take 1 tablet (1 mg total) by mouth daily. 12/14/19  Yes Hongalgi, Lenis Dickinson, MD  Multiple Vitamin  (MULTIVITAMIN WITH MINERALS) TABS tablet Take 1 tablet by mouth daily. 12/15/19  Yes Hongalgi, Lenis Dickinson, MD  pantoprazole (PROTONIX) 40 MG tablet Take 1 tablet (40 mg total) by mouth 2 (two) times daily before a meal. 12/14/19  Yes Hongalgi, Lenis Dickinson, MD  thiamine 100 MG tablet Take 1 tablet (100 mg total) by mouth daily. 12/14/19  Yes Hongalgi, Lenis Dickinson, MD  nicotine (NICODERM CQ - DOSED IN MG/24 HOURS) 21 mg/24hr patch Place 1 patch (21 mg total) onto the skin daily. 12/15/19   Modena Jansky, MD    Current Facility-Administered Medications  Medication Dose Route Frequency Provider Last Rate Last Admin  . 0.9 %  sodium chloride infusion  250 mL Intravenous PRN Opyd, Ilene Qua, MD      . cefTRIAXone (ROCEPHIN) 2 g in sodium chloride 0.9 % 100 mL IVPB  2 g Intravenous Q24H Allie Bossier, MD 200 mL/hr at 01/08/20 2020 2 g at 01/08/20 2020  . fentaNYL (SUBLIMAZE) injection 25 mcg  25 mcg Intravenous Q3H while awake Allie Bossier, MD   25 mcg at 01/09/20 0933  . folic acid (FOLVITE) tablet 1 mg  1 mg Oral Daily Opyd, Ilene Qua, MD   1 mg at 01/09/20 0953  . lactulose (CHRONULAC) 10 GM/15ML solution 10 g  10 g Oral BID Pokhrel, Laxman, MD   10 g at 01/09/20 0955  . LORazepam (ATIVAN) tablet 1 mg  1 mg Oral Q4H PRN Pokhrel, Laxman, MD      . magnesium oxide (MAG-OX) tablet 400 mg  400 mg Oral BID Pokhrel, Laxman, MD   400 mg at 01/09/20 0953  . multivitamin with minerals tablet 1 tablet  1 tablet Oral Daily Opyd, Ilene Qua, MD   1 tablet at 01/09/20 0952  . nicotine (NICODERM CQ - dosed in mg/24 hours) patch 21 mg  21 mg Transdermal Daily Opyd, Ilene Qua, MD   21 mg at 01/09/20 0958  . pantoprazole (PROTONIX) EC tablet 40 mg  40 mg Oral BID AC Opyd, Ilene Qua, MD   40 mg at 01/09/20 0954  . potassium chloride SA (KLOR-CON) CR tablet 40 mEq  40 mEq Oral Daily Pokhrel, Laxman, MD   40 mEq at 01/09/20 0952  . sodium chloride flush (NS) 0.9 % injection 3 mL  3 mL Intravenous Q12H Opyd, Ilene Qua, MD   3 mL  at 01/07/20 2231  . sodium chloride flush (NS) 0.9 % injection 3 mL  3 mL Intravenous Q12H Opyd, Ilene Qua, MD   3 mL at 01/07/20 2231  . sodium chloride flush (NS) 0.9 % injection 3 mL  3 mL Intravenous PRN Opyd, Ilene Qua, MD      . sodium phosphate 20 mmol in dextrose 5 % 250 mL infusion  20 mmol Intravenous Once Pokhrel, Laxman, MD 43 mL/hr at 01/09/20 0940 20 mmol at 01/09/20 0940  . thiamine tablet 100 mg  100 mg Oral Daily Opyd, Ilene Qua, MD   100 mg at 01/09/20 [id number]    Allergies as of 01/06/2020 - Review Complete 12/14/2019  Allergen Reaction Noted  . Aspirin Other (See Comments) 08/20/2016  . Penicillins Hives 01/09/2012  Family History  Problem Relation Age of Onset  . Colon cancer Father   . Cancer Sister   . CAD Neg Hx   . Stroke Neg Hx   . Diabetes Neg Hx     Social History   Socioeconomic History  . Marital status: Single    Spouse name: Not on file  . Number of children: Not on file  . Years of education: Not on file  . Highest education level: Not on file  Occupational History  . Not on file  Tobacco Use  . Smoking status: Current Every Day Smoker    Packs/day: 1.00  . Smokeless tobacco: Never Used  Substance and Sexual Activity  . Alcohol use: Yes    Alcohol/week: 2.0 standard drinks    Types: 1 Cans of beer, 1 Shots of liquor per week    Comment: heavy alcohol abuse a beer and couple of shots a day for past  14 years  . Drug use: No  . Sexual activity: Not Currently  Other Topics Concern  . Not on file  Social History Narrative  . Not on file   Social Determinants of Health   Financial Resource Strain:   . Difficulty of Paying Living Expenses:   Food Insecurity:   . Worried About Charity fundraiser in the Last Year:   . Arboriculturist in the Last Year:   Transportation Needs:   . Film/video editor (Medical):   Marland Kitchen Lack of Transportation (Non-Medical):   Physical Activity:   . Days of Exercise per Week:   . Minutes of Exercise per  Session:   Stress:   . Feeling of Stress :   Social Connections:   . Frequency of Communication with Friends and Family:   . Frequency of Social Gatherings with Friends and Family:   . Attends Religious Services:   . Active Member of Clubs or Organizations:   . Attends Archivist Meetings:   Marland Kitchen Marital Status:   Intimate Partner Violence:   . Fear of Current or Ex-Partner:   . Emotionally Abused:   Marland Kitchen Physically Abused:   . Sexually Abused:     Review of Systems: Pertinent positive and negative review of systems were noted in the above HPI section.  All other review of systems was otherwise negative.  Physical Exam: Vital signs in last 24 hours: Temp:  [97.6 F (36.4 C)-98.8 F (37.1 C)] 98.3 F (36.8 C) (04/08 0441) Pulse Rate:  [74-82] 82 (04/08 0441) Resp:  [17-20] 17 (04/08 0441) BP: (99-107)/(60-69) 104/60 (04/08 0441) SpO2:  [90 %-91 %] 91 % (04/08 0441) Weight:  [54.7 kg] 54.7 kg (04/08 0615)   General:   Alert,  Well-developed, very thin older African-American male pleasant and cooperative in NAD BMI 16.3 Head:  Normocephalic and atraumatic. Eyes:  Sclera clear, no icterus.   Conjunctiva pink. Ears:  Normal auditory acuity. Nose:  No deformity, discharge,  or lesions. Mouth:  No deformity or lesions.   Neck:  Supple; no masses or thyromegaly. Lungs:  Clear throughout to auscultation.   No wheezes, crackles, or rhonchi. Heart:  Regular rate and rhythm; no murmurs, clicks, rubs,  or gallops. Abdomen:  Soft,, nontense ascites, he does have some tenderness in the lower abdomen, low midline incisional scar, no palpable hepatomegaly currently bowel sounds present Rectal:  Deferred  Msk:  Symmetrical without gross deformities. . Pulses:  Normal pulses noted. Extremities: 1+ edema in the ankles bilaterally Neurologic:  Alert and  oriented x4;  grossly normal neurologically.  No asterixis  skin:  Intact without significant lesions or rashes.. Psych:  Alert and  cooperative. Normal mood and affect.  Intake/Output from previous day: 04/07 0701 - 04/08 0700 In: 360 [P.O.:360] Out: 420 [Urine:420] Intake/Output this shift: No intake/output data recorded.  Lab Results: Recent Labs    01/07/20 0944 01/08/20 0600 01/09/20 0603  WBC 22.8* 23.0* 22.8*  HGB 10.2* 9.7* 9.3*  HCT 30.0* 28.0* 27.1*  PLT 250 254 225   BMET Recent Labs    01/07/20 0946 01/08/20 0600 01/09/20 0603  NA 133* 134* 131*  K 3.1* 3.1* 3.7  CL 97* 97* 97*  CO2 29 27 27   GLUCOSE 92 95 92  BUN 10 7 6   CREATININE 0.64 0.57* 0.54*  CALCIUM 7.6* 7.8* 7.8*   LFT Recent Labs    01/09/20 0603  PROT 5.1*  ALBUMIN 1.8*  AST 56*  ALT 22  ALKPHOS 89  BILITOT 7.1*   PT/INR Recent Labs    01/08/20 0600 01/09/20 0603  LABPROT 23.3* 24.4*  INR 2.1* 2.2*   Hepatitis Panel No results for input(s): HEPBSAG, HCVAB, HEPAIGM, HEPBIGM in the last 72 hours.  IMPRESSION:   #68 54 year old African-American male with long history of EtOH abuse, admitted with new onset of abdominal distention, pain and peripheral edema. No active EtOH over the past 1 month He does not have definite cirrhosis by imaging, but does have significant steatosis, and was diagnosed with alcoholic hepatitis at the time of his last admission in March 2021.  He did not meet criteria for steroids at that time.  Rule out decompensation secondary to EtOH hepatitis, development of underlying cirrhosis, and also consider cardiogenic etiology for edema given known dilated cardiomyopathy.  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 daily x1 month Vitamin K 5 mg p.o. daily x3 days Continue Protonix 40 mg p.o. daily Await cardiac imaging prior to starting diuretics. Will follow with you    Allyce Bochicchio EsterwoodPA-C  01/09/2020, 11:27 AM

## 2020-01-09 NOTE — Progress Notes (Signed)
Nuclear stress test findings reviewed and show inferolateral ischemia.  Recommend cardiac cath but currently INR elevated. Will talk with [facility] about FFP prior to cath.

## 2020-01-09 NOTE — Progress Notes (Addendum)
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  Of note, patient was recently discharged from the hospital on 12/14/2019 and ever since then he has not drank alcohol.  He has however noted increasing abdominal distention and lower extremity edema, so he decided come to the hospital.  ED Course:Upon arrival to the ED, patient is found to be afebrile, saturating mid 90s on room air, and with stable blood pressure. Chemistry panel notable for potassium 2.4, albumin of 1.9, AST 70, ALT 26, and bilirubin 9.3. CBC with leukocytosis to 23,700 and stable normocytic anemia. Lactic acid was 2.6. Patient was given 2 g IV Rocephin, 20 mEq IV potassium, fentanyl, and Zofran in the ED. COVID-19 PCR was negative.    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  Counseled about it.  Marijuana/tobacco use disorder. 4/6 urine drug screen positive marijuana.  Counseled on sustaining abstinence of alcohol.  Continue nicotine patch  EtOH Hepatitis/Ascites with abdominal pain -EtOH abstinence currently.   Reinforced abstinence from  alcohol to qualify for liver transplant in the future.   Abdominal pain and distention from ascites and abdominal distention.  Diagnostic paracentesis revealed total of 124 cells and culture was negative.  Empirically being treated with IV Rocephin since with significant leukocytosis and abdominal pain.status post ultrasound-guided therapeutic paracentesis on 01/08/2020 with removal of 3.1 L of fluid.  Patient feels little better with distention and abdominal pain at this time.  No fever at this time.  Leukocytosis still significant at 22.8 thousand.  Continue thiamine.  Continue Protonix for now.  Blood cultures negative in 2 days.  AST mildly elevated at 56, ALT of 22.  Total Bilirubin at 7.1.  Creatinine 0.5.  INR elevated at 2.2.  Hypoalbuminemia likely contributing to lower extremity edema as well.  Continue lactulose for constipation.  Continue fluid restriction and low-salt diet.  Patient was seen by GI in the last admission.  Patient might qualify for prednisone at this time and will benefit from outpatient GI follow-up.  Spoke with GI for consultation this time.  Patient did have a EGD colonoscopy on 3/12 and had esophagitis at that time.  Hypokalemia, mild hypomagnesemia Continue to replenish potassium and magnesium.  Check levels in a.m.  EtOH cardiomyopathy/Acute on Chronic systolic CHF -EF 30 to [id number] on echocardiogram 11/08/2018. 2/6 Echocardiogram showed ejection fraction at 60 to 65%.  Continue strict in and out charting.  Continue daily weights.  Lasix on hold at this time.  Cardiology on board.  Cardiology recommending myocardial perfusion scan to rule out ischemic cardiomyopathy.   Anxiety.  Add as needed Ativan.  Prolonged QT  interval - QTc is 518 ms in ED.  Negative troponins. Cardiology on board.  Plan for myocardial bridging scan  Elevated INR Last INR 2.2.  Likely secondary to alcoholic hepatitis.  Hypophosphatemia.  We will replace with potassium phosphate today.  Check levels  in a.m.  VTE Prophylaxis: SCD  Code Status: Full code  Family Communication: None   Disposition Plan:  . Patient is from home . Likely disposition to home in 2 to 3 days . Barriers to discharge: Myocardial nuclear scan pending, IV antibiotic.  Consult GI , will consult physical therapy evaluation  Consultants:  IR  Procedures:  01/07/2020.  Diagnostic paracentesis  01/08/2020.  Therapeutic paracentesis.  Antibiotics:  . Rocephin iv 01/07/20>  Anti-infectives (From admission, onward)   Start     Dose/Rate Route Frequency Ordered Stop   01/07/20 2200  cefTRIAXone (ROCEPHIN) 2 g in sodium chloride 0.9 % 100 mL IVPB     2 g 200 mL/hr over 30 Minutes Intravenous Every 24 hours 01/07/20 1613     01/07/20 0330  cefTRIAXone (ROCEPHIN) 2 g in sodium chloride 0.9 % 100 mL IVPB     2 g 200 mL/hr over 30 Minutes Intravenous  Once 01/07/20 0325 01/07/20 0509      Subjective: Today, patient was seen and examined at bedside.  Complains of mild abdominal pain but better than yesterday after paracentesis.  Has low appetite.  Distention has improved.  Has had bowel movements recently.  Denies any nausea vomiting.  Objective: Vitals:   01/08/20 2106 01/09/20 0441  BP: 107/68 104/60  Pulse: 82 82  Resp: 20 17  Temp: 98.8 F (37.1 C) 98.3 F (36.8 C)  SpO2: 90% 91%    Intake/Output Summary (Last 24 hours) at 01/09/2020 1200 Last data filed at 01/09/2020 0127 Gross per 24 hour  Intake 360 ml  Output 300 ml  Net 60 ml   Filed Weights   01/06/20 2331 01/08/20 0457 01/09/20 0615  Weight: 56.7 kg 65.5 kg 54.7 kg   Body mass index is 16.36 kg/m.   Physical Exam:  GENERAL: Patient is alert awake and oriented. Not in obvious distress. Thinly bulit. HENT: No scleral pallor but icterus, Pupils equally reactive to light. Oral mucosa is moist NECK: is supple, no gross swelling noted. CHEST: Clear to auscultation. No crackles or wheezes.  Diminished breath sounds bilaterally. CVS: S1  and S2 heard, no murmur. Regular rate and rhythm.  ABDOMEN: Soft, abdominal distention++ with diffuse tenderness, less distention today, bowel sounds are present. EXTREMITIES: Trace lower extremity edema present. CNS: Cranial nerves are intact. No focal motor deficits. SKIN: warm and dry without rashes.  Data Review: I have personally reviewed the following laboratory data and studies,  CBC: Recent Labs  Lab 01/07/20 0129 01/07/20 0944 01/08/20 0600 01/09/20 0603  WBC 23.7* 22.8* 23.0* 22.8*  NEUTROABS 19.6* 18.9* 19.0* 18.0*  HGB 10.3* 10.2* 9.7* 9.3*  HCT 29.7* 30.0* 28.0* 27.1*  MCV 90.3 91.5 90.6 92.5  PLT 243 250 254 [id number]   Basic Metabolic Panel: Recent Labs  Lab 01/07/20 0129 01/07/20 0944 01/07/20 0946 01/08/20 0600 01/09/20 0603  NA 133* 134* 133* 134* 131*  K 2.4* 3.1* 3.1* 3.1* 3.7  CL 93* 96* 97* 97* 97*  CO2 28 26 29 27 27   GLUCOSE 93 87 92 95 92  BUN 11 10 10 7 6   CREATININE 0.84 0.67 0.64 0.57* 0.54*  CALCIUM 7.9* 7.6* 7.6* 7.8* 7.8*  MG 1.9 1.6*  --  1.5* 1.7  PHOS  --  3.0  --  1.7* 1.6*   Liver Function Tests: Recent Labs  Lab 01/07/20 0129 01/07/20 0944 01/08/20 0600 01/09/20 0603  AST 70* 65* 62* 56*  ALT 26 23 22 22   ALKPHOS 115 101 98 89  BILITOT 9.3* 8.4* 8.1* 7.1*  PROT 6.2* 5.6* 5.5* 5.1*  ALBUMIN 1.9* 1.7* 1.8* 1.8*   Recent Labs  Lab 01/07/20 0129  LIPASE 19   Recent Labs  Lab 01/07/20 0129  AMMONIA 69*   Cardiac Enzymes: No results for input(s): CKTOTAL, CKMB, CKMBINDEX, TROPONINI in the last 168 hours. BNP (last 3 results) Recent Labs    01/07/20 1345  BNP 211.4*    ProBNP (last 3 results) No results for input(s): PROBNP in the last 8760 hours.  CBG: No results for input(s): GLUCAP in the last 168 hours. Recent Results (from the past 240 hour(s))  SARS CORONAVIRUS 2 (TAT 6-24 HRS) Nasopharyngeal Nasopharyngeal Swab     Status: None   Collection Time: 01/07/20  2:25 AM   Specimen: Nasopharyngeal Swab  Result  Value Ref Range Status   SARS Coronavirus 2 NEGATIVE NEGATIVE Final    Comment: (NOTE) SARS-CoV-2 target nucleic acids are NOT DETECTED. The SARS-CoV-2 RNA is generally detectable in upper and lower respiratory specimens during the acute phase of infection. Negative results do not preclude SARS-CoV-2 infection, do not rule out co-infections with other pathogens, and should not be used as the sole basis for treatment or other patient management decisions. Negative results must be combined with clinical observations, patient history, and epidemiological information. The expected result is Negative. Fact Sheet for Patients: SugarRoll.be Fact Sheet for Healthcare Providers: https://www.woods-mathews.com/ This test is not yet approved or cleared by the [place] FDA and  has been authorized for detection and/or diagnosis of SARS-CoV-2 by FDA under an Emergency Use Authorization (EUA). This EUA will remain  in effect (meaning this test can be used) for the duration of the COVID-19 declaration under Section 56 4(b)(1) of the Act, 21 U.S.C. section 360bbb-3(b)(1), unless the authorization is terminated or revoked sooner. Performed at [facility], [place] [address] [place], [place] [address]   Body fluid culture     Status: None (Preliminary result)   Collection Time: 01/07/20  5:53 AM   Specimen: Peritoneal Cavity; Peritoneal Fluid  Result Value Ref Range Status   Specimen Description   Final    PERITONEAL CAVITY Performed at [facility] [address], [place], [place] [address]    Special Requests   Final    NONE Performed at [facility], [place] [address], [place], [place] [address]    Gram Stain   Final    WBC PRESENT,BOTH PMN AND MONONUCLEAR NO ORGANISMS SEEN Gram Stain Report Called to,Read Back By and Verified With: Mee Hives @ [phone] 01/07/2020 Performed at [facility],  [place] [address] [place], [place] [address]    Culture   Final    NO GROWTH 2 DAYS Performed at [facility] [address], [place], [place] [address]    Report Status PENDING  Incomplete  Culture, blood (routine x 2)     Status: None (Preliminary result)   Collection Time: 01/07/20  9:44 AM   Specimen: BLOOD RIGHT HAND  Result Value Ref Range Status   Specimen Description   Final    BLOOD RIGHT HAND Performed at [facility] [address] [place], [place] [address]  Special Requests   Final    BOTTLES DRAWN AEROBIC ONLY Blood Culture results may not be optimal due to an inadequate volume of blood received in culture bottles Performed at [facility] [address] [place], [place] [address]    Culture   Final    NO GROWTH 2 DAYS Performed at [facility] [address] [place], [place] [address]    Report Status PENDING  Incomplete  Culture, blood (routine x 2)     Status: None (Preliminary result)   Collection Time: 01/07/20  9:46 AM   Specimen: BLOOD  Result Value Ref Range Status   Specimen Description   Final    BLOOD LEFT ARM Performed at [facility] [address], [place], [place] [address]    Special Requests   Final    BOTTLES DRAWN AEROBIC ONLY BACTEROIDES CACCAE Performed at [facility], [place] [address], [place], [place] [address]    Culture   Final    NO GROWTH 2 DAYS Performed at [facility] [address], [place], [place] [address]    Report Status PENDING  Incomplete     Studies: US Paracentesis  Result Date: 01/08/2020 INDICATION: Patient with history of alcoholic hepatitis, CHF, ascites. Request made for therapeutic paracentesis. EXAM: ULTRASOUND GUIDED THERAPEUTIC PARACENTESIS MEDICATIONS: None COMPLICATIONS: None immediate. PROCEDURE: Informed written consent was obtained from the patient after a discussion of the risks, benefits and  alternatives to treatment. A timeout was performed prior to the initiation of the procedure. Initial ultrasound scanning demonstrates a moderate amount of ascites within the right lower abdominal quadrant. The right lower abdomen was prepped and draped in the usual sterile fashion. 1% lidocaine was used for local anesthesia. Following this, a 19 gauge, 7-cm, Yueh catheter was introduced. An ultrasound image was saved for documentation purposes. The paracentesis was performed. The catheter was removed and a dressing was applied. The patient tolerated the procedure well without immediate post procedural complication. Patient received post-procedure intravenous albumin; see nursing notes for details. FINDINGS: A total of approximately 3.1 liters of yellow fluid was removed. IMPRESSION: Successful ultrasound-guided therapeutic paracentesis yielding 3.1 liters of peritoneal fluid. Read by: Rowe Robert, PA-C Electronically Signed   By: Jerilynn Mages.  Shick M.D.   On: 01/08/2020 15:16   ECHOCARDIOGRAM COMPLETE  Result Date: 01/07/2020    ECHOCARDIOGRAM REPORT   Patient Name:   JULE DESTEFANO Date of Exam: 01/07/2020 Medical Rec #:  [id number]     Height:       72.0 in Accession #:    [id number]    Weight:       125.0 lb Date of Birth:  03-Oct-1966     BSA:          1.745 m Patient Age:    8 years      BP:           99/64 mmHg Patient Gender: M             HR:           78 bpm. Exam Location:  Inpatient Procedure: 2D Echo Indications:    414.8 cardiomyopathy  History:        Patient has prior history of Echocardiogram examinations, most                 recent 11/08/2018. Risk Factors:Hypertension and Current Smoker.  ETOH Abuse.  Sonographer:    Jannett Celestine RDCS (AE) Referring Phys: [id number] [place] Comments: Suboptimal subcostal window. IMPRESSIONS  1. Left ventricular ejection fraction, by estimation, is 60 to 65%. The left ventricle has normal function. The left ventricle has no regional wall  motion abnormalities. Left ventricular diastolic parameters were normal.  2. Right ventricular systolic function is normal. The right ventricular size is normal.  3. The mitral valve is normal in structure. No evidence of mitral valve regurgitation. No evidence of mitral stenosis.  4. The aortic valve is normal in structure. Aortic valve regurgitation is not visualized. No aortic stenosis is present.  5. The inferior vena cava is normal in size with greater than 50% respiratory variability, suggesting right atrial pressure of 3 mmHg. Comparison(s): Prior images unable to be directly viewed, comparison made by report only. Changes from prior study are noted. The left ventricular function has improved. FINDINGS  Left Ventricle: Left ventricular ejection fraction, by estimation, is 60 to 65%. The left ventricle has normal function. The left ventricle has no regional wall motion abnormalities. The left ventricular internal cavity size was normal in size. There is  no left ventricular hypertrophy. Left ventricular diastolic parameters were normal. Normal left ventricular filling pressure. Right Ventricle: The right ventricular size is normal. No increase in right ventricular wall thickness. Right ventricular systolic function is normal. Left Atrium: Left atrial size was normal in size. Right Atrium: Right atrial size was normal in size. Pericardium: There is no evidence of pericardial effusion. Mitral Valve: The mitral valve is normal in structure. Normal mobility of the mitral valve leaflets. No evidence of mitral valve regurgitation. No evidence of mitral valve stenosis. Tricuspid Valve: The tricuspid valve is normal in structure. Tricuspid valve regurgitation is not demonstrated. No evidence of tricuspid stenosis. Aortic Valve: The aortic valve is normal in structure. Aortic valve regurgitation is not visualized. No aortic stenosis is present. Pulmonic Valve: The pulmonic valve was normal in structure. Pulmonic valve  regurgitation is not visualized. No evidence of pulmonic stenosis. Aorta: The aortic root is normal in size and structure. Venous: The inferior vena cava is normal in size with greater than 50% respiratory variability, suggesting right atrial pressure of 3 mmHg. IAS/Shunts: No atrial level shunt detected by color flow Doppler.  LEFT VENTRICLE PLAX 2D LVIDd:         3.80 cm  Diastology LVIDs:         2.50 cm  LV e' lateral:   14.90 cm/s LV PW:         0.90 cm  LV E/e' lateral: 6.2 LV IVS:        0.70 cm  LV e' medial:    12.10 cm/s LVOT diam:     1.90 cm  LV E/e' medial:  7.6 LV SV:         72 LV SV Index:   41 LVOT Area:     2.84 cm  RIGHT VENTRICLE RV S prime:     13.90 cm/s TAPSE (M-mode): 2.5 cm LEFT ATRIUM             Index       RIGHT ATRIUM           Index LA diam:        3.80 cm 2.18 cm/m  RA Area:     13.60 cm LA Vol (A2C):   39.5 ml 22.64 ml/m RA Volume:   31.10 ml  17.82 ml/m LA Vol (  A4C):   41.0 ml 23.50 ml/m LA Biplane Vol: 41.0 ml 23.50 ml/m  AORTIC VALVE LVOT Vmax:   139.00 cm/s LVOT Vmean:  104.000 cm/s LVOT VTI:    0.254 m  AORTA Ao Root diam: 3.20 cm MITRAL VALVE MV Area (PHT): 5.75 cm    SHUNTS MV Decel Time: 132 msec    Systemic VTI:  0.25 m MV E velocity: 92.40 cm/s  Systemic Diam: 1.90 cm MV A velocity: 77.10 cm/s MV E/A ratio:  1.20 Mihai Croitoru MD Electronically signed by Sanda Klein MD Signature Date/Time: 01/07/2020/1:49:36 PM    Final       Flora Lipps, MD  [facility] 01/09/2020

## 2020-01-10 ENCOUNTER — Inpatient Hospital Stay (HOSPITAL_COMMUNITY): Payer: Self-pay

## 2020-01-10 DIAGNOSIS — R931 Abnormal findings on diagnostic imaging of heart and coronary circulation: Secondary | ICD-10-CM

## 2020-01-10 LAB — COMPREHENSIVE METABOLIC PANEL
ALT: 31 U/L (ref 0–44)
AST: 57 U/L — ABNORMAL HIGH (ref 15–41)
Albumin: 2.1 g/dL — ABNORMAL LOW (ref 3.5–5.0)
Alkaline Phosphatase: 120 U/L (ref 38–126)
Anion gap: 15 (ref 5–15)
BUN: 9 mg/dL (ref 6–20)
CO2: 24 mmol/L (ref 22–32)
Calcium: 8.6 mg/dL — ABNORMAL LOW (ref 8.9–10.3)
Chloride: 94 mmol/L — ABNORMAL LOW (ref 98–111)
Creatinine, Ser: 0.7 mg/dL (ref 0.61–1.24)
GFR calc Af Amer: >60 mL/min (ref >60)
GFR calc non Af Amer: >60 mL/min (ref >60)
Glucose, Bld: 128 mg/dL — ABNORMAL HIGH (ref 70–99)
Potassium: 4.8 mmol/L (ref 3.5–5.1)
Sodium: 133 mmol/L — ABNORMAL LOW (ref 135–145)
Total Bilirubin: 7.5 mg/dL — ABNORMAL HIGH (ref 0.3–1.2)
Total Protein: 6.5 g/dL (ref 6.5–8.1)

## 2020-01-10 LAB — CBC WITH DIFFERENTIAL/PLATELET
Abs Immature Granulocytes: 0.23 10*3/uL — ABNORMAL HIGH (ref 0.00–0.07)
Basophils Absolute: 0.1 10*3/uL (ref 0.0–0.1)
Basophils Relative: 0 %
Eosinophils Absolute: 0 10*3/uL (ref 0.0–0.5)
Eosinophils Relative: 0 %
HCT: 34.9 % — ABNORMAL LOW (ref 39.0–52.0)
Hemoglobin: 11.5 g/dL — ABNORMAL LOW (ref 13.0–17.0)
Immature Granulocytes: 1 %
Lymphocytes Relative: 6 %
Lymphs Abs: 1.6 10*3/uL (ref 0.7–4.0)
MCH: 30.7 pg (ref 26.0–34.0)
MCHC: 33 g/dL (ref 30.0–36.0)
MCV: 93.3 fL (ref 80.0–100.0)
Monocytes Absolute: 0.9 10*3/uL (ref 0.1–1.0)
Monocytes Relative: 4 %
Neutro Abs: 23.8 10*3/uL — ABNORMAL HIGH (ref 1.7–7.7)
Neutrophils Relative %: 89 %
Platelets: 288 10*3/uL (ref 150–400)
RBC: 3.74 MIL/uL — ABNORMAL LOW (ref 4.22–5.81)
RDW: 19.8 % — ABNORMAL HIGH (ref 11.5–15.5)
WBC: 26.7 10*3/uL — ABNORMAL HIGH (ref 4.0–10.5)
nRBC: 0 % (ref 0.0–0.2)

## 2020-01-10 LAB — PROTIME-INR
INR: 2.1 — ABNORMAL HIGH (ref 0.8–1.2)
Prothrombin Time: 23.2 seconds — ABNORMAL HIGH (ref 11.4–15.2)

## 2020-01-10 LAB — BODY FLUID CULTURE: Culture: NO GROWTH

## 2020-01-10 LAB — MAGNESIUM: Magnesium: 1.7 mg/dL (ref 1.7–2.4)

## 2020-01-10 LAB — PHOSPHORUS: Phosphorus: 2.6 mg/dL (ref 2.5–4.6)

## 2020-01-10 MED ORDER — FUROSEMIDE 40 MG PO TABS
40.0000 mg | ORAL_TABLET | Freq: Every day | ORAL | Status: DC
  Administered 2020-01-11 – 2020-01-16 (×6): 40 mg via ORAL
  Filled 2020-01-10 (×6): qty 1

## 2020-01-10 MED ORDER — ALBUMIN HUMAN 25 % IV SOLN
50.0000 g | Freq: Once | INTRAVENOUS | Status: AC
  Administered 2020-01-10: 50 g via INTRAVENOUS
  Filled 2020-01-10: qty 200

## 2020-01-10 MED ORDER — SPIRONOLACTONE 100 MG PO TABS
100.0000 mg | ORAL_TABLET | Freq: Every day | ORAL | Status: DC
  Administered 2020-01-11 – 2020-01-16 (×6): 100 mg via ORAL
  Filled 2020-01-10 (×6): qty 1

## 2020-01-10 MED ORDER — LIDOCAINE HCL 1 % IJ SOLN
INTRAMUSCULAR | Status: AC
  Filled 2020-01-10: qty 20

## 2020-01-10 NOTE — Procedures (Signed)
Ultrasound-guided therapeutic paracentesis performed yielding 2.9 liters of yellow  fluid. No immediate complications. EBL none.  Post procedure IV albumin administration per GI service.

## 2020-01-10 NOTE — Evaluation (Signed)
Physical Therapy Evaluation Patient Details Name: Richard [name] MRN: [id number] DOB: 1966/06/14 Today's Date: 01/10/2020   History of Present Illness  Richard [name] is a 54 y.o. 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  Of note, patient was recently discharged from the hospital on 12/14/2019 and ever since then he has not drank alcohol.  He has however noted increasing abdominal distention and lower extremity edema, so he decided come to the hospital.  Clinical Impression  Physical therapy evaluation and gait training completed, patient is at baseline and no further PT services recommended at this time. Educated pt to call for nursing when needing restroom or wanting to ambulate so they can safely manage IV lines. Patient discharged to care of nursing for ambulation daily as tolerated for length of stay.     Follow Up Recommendations No PT follow up    Equipment Recommendations  None recommended by PT    Recommendations for Other Services       Precautions / Restrictions Precautions Precautions: None Restrictions Weight Bearing Restrictions: No      Mobility  Bed Mobility Overal bed mobility: Independent             General bed mobility comments: safely comes to EOB without assist or bedrail  Transfers Overall transfer level: Independent               General transfer comment: steadiness upon standing  Ambulation/Gait Ambulation/Gait assistance: Modified independent (Device/Increase time) Gait Distance (Feet): 100 Feet Assistive device: IV Pole;None Gait Pattern/deviations: WFL(Within Functional Limits) Gait velocity: slightly decreased   General Gait Details: ambualtes in hallway and room with IV pole, progressing to no AD, no unsteadiness noted  with straightline gait or turns  Science writer    Modified Rankin (Stroke Patients Only)       Balance Overall balance assessment: Modified Independent                      Pertinent Vitals/Pain Pain Assessment: No/denies pain    Home Living Family/patient expects to be discharged to:: Private residence Living Arrangements: Other relatives Available Help at Discharge: Family;Available 24 hours/day Type of Home: Apartment Home Access: Level entry     Home Layout: One level Home Equipment: Cane - single point      Prior Function Level of Independence: Independent;Independent with assistive device(s)         Comments: Pt reports independent with ADLs, community ambulation with and without SPC, brother provides transportation or uses city bus.     Hand Dominance        Extremity/Trunk Assessment   Upper Extremity Assessment Upper Extremity Assessment: Overall WFL for tasks assessed    Lower Extremity Assessment Lower Extremity Assessment: Overall WFL for tasks assessed(BLE AROM WNL, strength 4/5)    Cervical / Trunk Assessment Cervical / Trunk Assessment: Normal  Communication   Communication: No difficulties  Cognition Arousal/Alertness: Awake/alert Behavior During Therapy: WFL for tasks assessed/performed Overall Cognitive Status: Within Functional Limits for tasks assessed             General Comments      Exercises     Assessment/Plan    PT Assessment Patent does not need any further PT  services  PT Problem List         PT Treatment Interventions      PT Goals (Current goals can be found in the Care Plan section)  Acute Rehab PT Goals Patient Stated Goal: take a walk PT Goal Formulation: With patient Time For Goal Achievement: 01/10/20 Potential to Achieve Goals: Good    Frequency     Barriers to discharge        Co-evaluation               AM-PAC PT "6 Clicks" Mobility  Outcome  Measure Help needed turning from your back to your side while in a flat bed without using bedrails?: None Help needed moving from lying on your back to sitting on the side of a flat bed without using bedrails?: None Help needed moving to and from a bed to a chair (including a wheelchair)?: None Help needed standing up from a chair using your arms (e.g., wheelchair or bedside chair)?: None Help needed to walk in hospital room?: None Help needed climbing 3-5 steps with a railing? : None 6 Click Score: 24    End of Session   Activity Tolerance: Patient tolerated treatment well Patient left: in bed;with call bell/phone within reach;with bed alarm set Nurse Communication: Mobility status PT Visit Diagnosis: Other abnormalities of gait and mobility (R26.89)    Time: [id number] PT Time Calculation (min) (ACUTE ONLY): 18 min   Charges:   PT Evaluation $PT Eval Low Complexity: 1 Low PT Treatments $Gait Training: 8-22 mins        Richard [name] PT, DPT 01/10/20, 12:58 PM [phone]

## 2020-01-10 NOTE — Progress Notes (Signed)
Progress Note  Patient Name: Richard [name] Date of Encounter: 01/10/2020  Primary Cardiologist: Jenkins Rouge, MD   Subjective   Feeling okay this morning. Belly is starting to bother him again - feels tight. Still passing gas. No complaints of chest pain or SOB overnight. We discussed his stress test results and need for eventual LHC to further evaluate these findings. We discussed that his INR is a limiting factor at this point.   Inpatient Medications    Scheduled Meds: . fentaNYL (SUBLIMAZE) injection  25 mcg Intravenous Q3H while awake  . folic acid  1 mg Oral Daily  . lactulose  10 g Oral BID  . magnesium oxide  400 mg Oral BID  . multivitamin with minerals  1 tablet Oral Daily  . nicotine  21 mg Transdermal Daily  . pantoprazole  40 mg Oral BID AC  . phytonadione  5 mg Oral Daily  . potassium chloride  40 mEq Oral Daily  . prednisoLONE  40 mg Oral Daily  . sodium chloride flush  3 mL Intravenous Q12H  . sodium chloride flush  3 mL Intravenous Q12H  . thiamine  100 mg Oral Daily   Continuous Infusions: . sodium chloride    . cefTRIAXone (ROCEPHIN)  IV 2 g (01/09/20 2126)   PRN Meds: sodium chloride, HYDROcodone-acetaminophen, LORazepam, sodium chloride flush   Vital Signs    Vitals:   01/09/20 1513 01/09/20 2023 01/10/20 0426 01/10/20 0603  BP: (!) 106/59 102/65 99/68   Pulse: 72 79 74   Resp:  17 17   Temp:  97.8 F (36.6 C) 97.7 F (36.5 C)   TempSrc:      SpO2:  91% 91%   Weight:    55.4 kg  Height:        Intake/Output Summary (Last 24 hours) at 01/10/2020 0803 Last data filed at 01/10/2020 [id number] Gross per 24 hour  Intake 226.56 ml  Output 400 ml  Net -173.44 ml   Filed Weights   01/08/20 0457 01/09/20 0615 01/10/20 0603  Weight: 65.5 kg 54.7 kg 55.4 kg    Telemetry    Sinus rhythm  - Personally Reviewed  ECG    No new tracings - Personally Reviewed  Physical Exam   GEN: Thin gentleman who appears older than stated age   Neck: No  JVD, no carotid bruits Cardiac: RRR, no murmurs, rubs, or gallops.  Respiratory: Clear to auscultation bilaterally, no wheezes/ rales/ rhonchi GI: NABS, Soft, nontender, non-distended  MS: No edema; No deformity. Neuro:  Nonfocal, moving all extremities spontaneously Psych: Normal affect   Labs    Chemistry Recent Labs  Lab 01/08/20 0600 01/09/20 0603 01/10/20 0555  NA 134* 131* 133*  K 3.1* 3.7 4.8  CL 97* 97* 94*  CO2 27 27 24   GLUCOSE 95 92 128*  BUN 7 6 9   CREATININE 0.57* 0.54* 0.70  CALCIUM 7.8* 7.8* 8.6*  PROT 5.5* 5.1* 6.5  ALBUMIN 1.8* 1.8* 2.1*  AST 62* 56* 57*  ALT 22 22 31   ALKPHOS 98 89 120  BILITOT 8.1* 7.1* 7.5*  GFRNONAA >60 >60 >60  GFRAA >60 >60 >60  ANIONGAP 10 7 15      Hematology Recent Labs  Lab 01/08/20 0600 01/09/20 0603 01/10/20 0555  WBC 23.0* 22.8* 26.7*  RBC 3.09* 2.93* 3.74*  HGB 9.7* 9.3* 11.5*  HCT 28.0* 27.1* 34.9*  MCV 90.6 92.5 93.3  MCH 31.4 31.7 30.7  MCHC 34.6 34.3 33.0  RDW 19.2*  19.3* 19.8*  PLT 254 225 288    Cardiac EnzymesNo results for input(s): TROPONINI in the last 168 hours. No results for input(s): TROPIPOC in the last 168 hours.   BNP Recent Labs  Lab 01/07/20 1345  BNP 211.4*     DDimer No results for input(s): DDIMER in the last 168 hours.   Radiology    NM Myocar Multi W/Spect W/Wall Motion / EF  Result Date: 01/09/2020  There was no ST segment deviation noted during stress.  Defect 1: There is a medium defect of mild severity present in the basal inferolateral, mid inferolateral and apical lateral location.  This is an intermediate risk study.  The left ventricular ejection fraction is mildly decreased (45-54%).  Nuclear stress EF: 51%.  Intermediate risk study due to reversible inferolateral ischemia (moderate extent, mild severity) and mildly depressed left ventricular systolic function.   US Paracentesis  Result Date: 01/08/2020 INDICATION: Patient with history of alcoholic hepatitis, CHF,  ascites. Request made for therapeutic paracentesis. EXAM: ULTRASOUND GUIDED THERAPEUTIC PARACENTESIS MEDICATIONS: None COMPLICATIONS: None immediate. PROCEDURE: Informed written consent was obtained from the patient after a discussion of the risks, benefits and alternatives to treatment. A timeout was performed prior to the initiation of the procedure. Initial ultrasound scanning demonstrates a moderate amount of ascites within the right lower abdominal quadrant. The right lower abdomen was prepped and draped in the usual sterile fashion. 1% lidocaine was used for local anesthesia. Following this, a 19 gauge, 7-cm, Yueh catheter was introduced. An ultrasound image was saved for documentation purposes. The paracentesis was performed. The catheter was removed and a dressing was applied. The patient tolerated the procedure well without immediate post procedural complication. Patient received post-procedure intravenous albumin; see nursing notes for details. FINDINGS: A total of approximately 3.1 liters of yellow fluid was removed. IMPRESSION: Successful ultrasound-guided therapeutic paracentesis yielding 3.1 liters of peritoneal fluid. Read by: Rowe Robert, PA-C Electronically Signed   By: Jerilynn Mages.  Shick M.D.   On: 01/08/2020 15:16    Cardiac Studies   Echo 11/2018  1. The left ventricle has moderate-severely reduced systolic function of  [id number]. The cavity size was normal. There is no increased left ventricular  wall thickness. Echo evidence of impaired diastolic relaxation Left  ventrical global hypokinesis without  regional wall motion abnormalities.  2. The right ventricle has mildly reduced systolic function. The cavity  was mildly enlarged. There is no increase in right ventricular wall  thickness.  3. The mitral valve is normal in structure. No evidence of mitral valve  stenosis. No significant mitral regurgitation.  4. The tricuspid valve is normal in structure.  5. The aortic valve is  tricuspid. No aortic stenosis.  6. The aortic root and ascending aorta are normal in size and structure.  7. The inferior vena cava is normal in size with greater than 50%  respiratory variability.  8. No complete TR doppler jet so unable to estimate PA systolic pressure.  Echocardiogram 01/07/20: 1. Left ventricular ejection fraction, by estimation, is 60 to 65%. The  left ventricle has normal function. The left ventricle has no regional  wall motion abnormalities. Left ventricular diastolic parameters were  normal.  2. Right ventricular systolic function is normal. The right ventricular  size is normal.  3. The mitral valve is normal in structure. No evidence of mitral valve  regurgitation. No evidence of mitral stenosis.  4. The aortic valve is normal in structure. Aortic valve regurgitation is  not visualized. No aortic stenosis  is present.  5. The inferior vena cava is normal in size with greater than 50%  respiratory variability, suggesting right atrial pressure of 3 mmHg.  NST 01/09/2020:  There was no ST segment deviation noted during stress.  Defect 1: There is a medium defect of mild severity present in the basal inferolateral, mid inferolateral and apical lateral location.  This is an intermediate risk study.  The left ventricular ejection fraction is mildly decreased (45-54%).  Nuclear stress EF: 51%.   Intermediate risk study due to reversible inferolateral ischemia (moderate extent, mild severity) and mildly depressed left ventricular systolic function.  Patient Profile     54 y.o. male with a hx of alcohol abuse and withdrawal, colon cancer status post surgery 2007 without follow-up, PUD, GERD, alcoholic pancreatitis, chronic abdominal pain, hypertension, SVT, chronic systolic heart failure (EF 30-35% in 11/2018)who is being seen today for the evaluation of heart failure and prolonged Qt  Assessment & Plan    1. Chronic combined CHF/dilated cardiomyopathy:  EF 30-35% on echo 11/2018 with G1DD, no RWMA, and mildly reduced RV systolic function; improved with EF 60-65% on echo this admission. CM felt to be 2/2 alcohol abuse with possible tachycardia mediated component as well as uncontrolled HTN. Hypoalbuminemia has contributed to ascites. BNP was 211 this admission. Hypotension has limited management. He had left-sided chest pain complaints and was initially planned for a coronary CTA to evaluate for ischemic cause of his CM, however HR's were difficult to slow down and he ultimately underwent a NST 01/09/20 which revealed a reversible inferolateral defect. He is here with alcoholic hepatitis c/b coagulopathy with INR 2.1 today despite receiving Vitamin K yesterday. Weight is up 2lbs from yesterday to 122lbs today. I&Os are incomplete - Will tentatively place on the schedule for [facility] 01/13/20 pending improvement in INR - Will check FLP and A1C in AM for risk stratification - Consider starting BBlocker if room in BP   2. Alcoholic hepatitis: patient presented with abdominal pain/distention and LEE. He has had hyperbilirubinemia, leukocytosis, coagulopathy, and ascites but no SBP and recent imaging negative for cirrhosis or varices. He was started on prednisolone for management with plans for a 28 day course. He underwent a paracentesis 01/08/20 with 3.1L of fluid removed. Again with abdominal distention today - Continue management per GI and primary team - Continue to encourage ETOH abstinence   3. Prolonged QT interval: QTc 518 in the ED. Likely 2/2 metabolic derangement (K 2.4 on admission) - Avoid QT prolonging medication  4. HTN: more recently hypotension this admission. Limits management of #1 - Continue to monitor    For questions or updates, please contact [place] Please consult [email or web address] for contact info under Cardiology/STEMI.      Signed, Abigail Butts, PA-C  01/10/2020, 8:03 AM   [phone]

## 2020-01-10 NOTE — Progress Notes (Signed)
PROGRESS NOTE  Richard [name] [id number] DOB: 06/18/1966 DOA: 01/06/2020 PCP: Antony Blackbird, MD   LOS: 3 days   Brief narrative: As per HPI,  Richard [name] a 54 y.o.malePMHx of EtOH in early remission, drug abuse, , pancreatitis, PUD, colon cancer s/p  colectomy, EtOH Cardiomyopathy with EF 30 to 35% in February 2020, HTN presented to hospital with complaints of abdominal pain, distention and leg swelling.  Of note, patient was recently discharged from the hospital on 12/14/2019 and ever since then he has not drank alcohol.  He has however noted increasing abdominal distention and lower extremity edema, so he decided come to the hospital.ED Course:Upon arrival to the ED, patient is found to be afebrile, saturating mid 90s on room air, and with stable blood pressure. Chemistry panel notable for potassium 2.4, albumin of 1.9, AST 70, ALT 26, and bilirubin 9.3. CBC with leukocytosis to 23,700 and stable normocytic anemia. Lactic acid was 2.6. Patient was given 2 g IV Rocephin, 20 mEq IV potassium, fentanyl, and Zofran in the ED. COVID-19 PCR was negative.  Patient was then admitted to the hospital for further evaluation and treatment.   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  No evidence of SBP but ascites has been recurrent.  Will be scheduled for static tapping again today.  Abdominal pain has improved after tapping but his  ascites is likely to be recurrent unless he is put on diuretics.  Initial.paracentesis on 01/08/2020 with removal of 3.1 L of fluid.  Continue Protonix.  Blood cultures and peritoneal fluid negative.  Have high discrimination score so has been started on prednisolone as per GI.    Continue fluid restriction and low-salt diet.    Patient did have a EGD colonoscopy on 3/12 and had esophagitis at that time.  On IV Rocephin due to significant leukocytosis.  EtOH cardiomyopathy/Acute on Chronic systolic CHF -EF 30 to [id number] on echocardiogram 11/08/2018. 2/6 Echocardiogram showed ejection fraction at 60 to 65%.  Abnormal myocardial perfusion scan.  Cardiology recommended cardiac catheter when INR is better.  Patient might need to have placed frozen plasma prior to cardiac catheterization since vitamin K has not improved his INR.  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  On vitamin K daily.  INR still 2.1.  EtOH abuse in remission Patient has not drank alcohol since last admission.  He is motivated to abstain  Marijuana/tobacco use disorder. 4/6 urine drug screen positive marijuana.    Continue nicotine patch  Hypokalemia, mild hypomagnesemia Improved with replacement.  Anxiety.  Add as needed Ativan.  Prolonged QT interval - QTc is 518 ms in ED.  Negative troponins. Cardiology on board.  Status post myocardial perfusion scan.  Hypophosphatemia.  Replenished with potassium phosphate yesterday.  Phosphorus level of 2.6 today.  VTE Prophylaxis: SCD  Code Status: Full code  Family Communication: None   Disposition Plan:  . Patient is from home . Likely disposition to home in 2 to  3 days . Barriers to discharge: Plan for cardiac cath pending INR improvement, repeat ascitic tapping today, IV antibiotic.    Consultants:  IR  GI  Cardiology  Procedures:  09-Jan-2020.  Diagnostic paracentesis  01/08/2020.  Therapeutic paracentesis.  01/08/2020.  Myocardial perfusion  scan.  Antibiotics:  . Rocephin iv 2020/01/09>  Anti-infectives (From admission, onward)   Start     Dose/Rate Route Frequency Ordered Stop   01-09-20 2200  cefTRIAXone (ROCEPHIN) 2 g in sodium chloride 0.9 % 100 mL IVPB     2 g 200 mL/hr over 30 Minutes Intravenous Every 24 hours 01-09-20 1613     01-09-2020 0330  cefTRIAXone (ROCEPHIN) 2 g in sodium chloride 0.9 % 100 mL IVPB     2 g 200 mL/hr over 30 Minutes Intravenous  Once 2020-01-09 0325 January 09, 2020 0509      Subjective: Today, patient again complains of abdominal distention.  Denies any nausea vomiting fever chills.   Objective: Vitals:   01/10/20 0426 01/10/20 1354  BP: 99/68 105/69  Pulse: 74 76  Resp: 17 20  Temp: 97.7 F (36.5 C) 97.6 F (36.4 C)  SpO2: 91% 95%    Intake/Output Summary (Last 24 hours) at 01/10/2020 1411 Last data filed at 01/10/2020 [id number] Gross per 24 hour  Intake 226.56 ml  Output 400 ml  Net -173.44 ml   Filed Weights   01/08/20 0457 01/09/20 0615 01/10/20 0603  Weight: 65.5 kg 54.7 kg 55.4 kg   Body mass index is 16.57 kg/m.   Physical Exam:  General: Thinly built, alert awake communicative.  Not in distress. HENT: Normocephalic, pupils equally reacting to light and accommodation.  Icteric. Chest:  Clear breath sounds.  Diminished breath sounds bilaterally. No crackles or wheezes.  CVS: S1 &S2 heard. No murmur.  Regular rate and rhythm. Abdomen: Soft, mild tenderness on palpation, distended abdomen, nondistended.  Bowel sounds are heard.  Liver is not palpable, no abdominal mass palpated Extremities: No cyanosis, clubbing with trace edema.  Peripheral pulses are palpable. Psych: Alert, awake and oriented, normal mood CNS:  No cranial nerve deficits.  Power equal in all extremities.  No sensory deficits noted.  No cerebellar signs.   Skin: Warm and dry.  No rashes noted.   Data Review: I have personally reviewed the following laboratory data and studies,  CBC: Recent Labs  Lab  2020-01-09 0129 2020/01/09 0944 01/08/20 0600 01/09/20 0603 01/10/20 0555  WBC 23.7* 22.8* 23.0* 22.8* 26.7*  NEUTROABS 19.6* 18.9* 19.0* 18.0* 23.8*  HGB 10.3* 10.2* 9.7* 9.3* 11.5*  HCT 29.7* 30.0* 28.0* 27.1* 34.9*  MCV 90.3 91.5 90.6 92.5 93.3  PLT 243 250 254 225 [id number]   Basic Metabolic Panel: Recent Labs  Lab January 09, 2020 0129 01/09/2020 0129 01-09-2020 0944 2020/01/09 0946 01/08/20 0600 01/09/20 0603 01/10/20 0555  NA 133*   < > 134* 133* 134* 131* 133*  K 2.4*   < > 3.1* 3.1* 3.1* 3.7 4.8  CL 93*   < > 96* 97* 97* 97* 94*  CO2 28   < > 26 29 27 27 24   GLUCOSE 93   < > 87 92 95 92 128*  BUN 11   < > 10 10 7 6 9   CREATININE 0.84   < > 0.67 0.64 0.57* 0.54* 0.70  CALCIUM 7.9*   < > 7.6* 7.6* 7.8* 7.8* 8.6*  MG 1.9  --  1.6*  --  1.5* 1.7 1.7  PHOS  --   --  3.0  --  1.7* 1.6* 2.6   < > = values in this interval not displayed.   Liver Function Tests: Recent Labs  Lab 01/07/20 0129 01/07/20 0944 01/08/20 0600 01/09/20 0603 01/10/20 0555  AST 70* 65* 62* 56* 57*  ALT 26 23 22 22 31   ALKPHOS 115 101 98 89 120  BILITOT 9.3* 8.4* 8.1* 7.1* 7.5*  PROT 6.2* 5.6* 5.5* 5.1* 6.5  ALBUMIN 1.9* 1.7* 1.8* 1.8* 2.1*   Recent Labs  Lab 01/07/20 0129  LIPASE 19   Recent Labs  Lab 01/07/20 0129  AMMONIA 69*   Cardiac Enzymes: No results for input(s): CKTOTAL, CKMB, CKMBINDEX, TROPONINI in the last 168 hours. BNP (last 3 results) Recent Labs    01/07/20 1345  BNP 211.4*    ProBNP (last 3 results) No results for input(s): PROBNP in the last 8760 hours.  CBG: No results for input(s): GLUCAP in the last 168 hours. Recent Results (from the past 240 hour(s))  SARS CORONAVIRUS 2 (TAT 6-24 HRS) Nasopharyngeal Nasopharyngeal Swab     Status: None   Collection Time: 01/07/20  2:25 AM   Specimen: Nasopharyngeal Swab  Result Value Ref Range Status   SARS Coronavirus 2 NEGATIVE NEGATIVE Final    Comment: (NOTE) SARS-CoV-2 target nucleic acids are NOT DETECTED. The SARS-CoV-2  RNA is generally detectable in upper and lower respiratory specimens during the acute phase of infection. Negative results do not preclude SARS-CoV-2 infection, do not rule out co-infections with other pathogens, and should not be used as the sole basis for treatment or other patient management decisions. Negative results must be combined with clinical observations, patient history, and epidemiological information. The expected result is Negative. Fact Sheet for Patients: SugarRoll.be Fact Sheet for Healthcare Providers: https://www.woods-mathews.com/ This test is not yet approved or cleared by the [place] FDA and  has been authorized for detection and/or diagnosis of SARS-CoV-2 by FDA under an Emergency Use Authorization (EUA). This EUA will remain  in effect (meaning this test can be used) for the duration of the COVID-19 declaration under Section 56 4(b)(1) of the Act, 21 U.S.C. section 360bbb-3(b)(1), unless the authorization is terminated or revoked sooner. Performed at [facility], [place] [address], [place], [place] [address]   Body fluid culture     Status: None   Collection Time: 01/07/20  5:53 AM   Specimen: Peritoneal Cavity; Peritoneal Fluid  Result Value Ref Range Status   Specimen Description   Final    PERITONEAL CAVITY Performed at [place] [address] [place], [place] [address]    Special Requests   Final    NONE Performed at [facility], [place] [address] [place], [place] [address]    Gram Stain   Final    WBC PRESENT,BOTH PMN AND MONONUCLEAR NO ORGANISMS SEEN Gram Stain Report Called to,Read Back By and Verified With: Mee Hives @ [phone] 01/07/2020 Performed at [facility], [place] [address], [place], [place] [address]    Culture   Final    NO GROWTH 3 DAYS Performed at [facility], [place] [address], [place], [place] [address]    Report  Status 01/10/2020 FINAL  Final  Culture, blood (routine x 2)     Status: None (Preliminary result)   Collection Time: 01/07/20  9:44 AM   Specimen: BLOOD RIGHT HAND  Result Value Ref Range Status   Specimen Description   Final    BLOOD RIGHT HAND Performed at [facility], [place] [address], [place], [place] [address]    Special Requests   Final    BOTTLES DRAWN AEROBIC ONLY Blood Culture results may not be optimal due to an inadequate volume of blood received in culture bottles Performed at [place] [address] [place], [place] [address]    Culture   Final    NO GROWTH 3 DAYS Performed at [facility], [place] [address], [place], [place] [address]    Report Status PENDING  Incomplete  Culture, blood (routine x 2)     Status: None (Preliminary result)   Collection Time: 01/07/20  9:46 AM   Specimen: BLOOD  Result Value Ref Range Status   Specimen Description   Final    BLOOD LEFT ARM Performed at [place] [address] [place], [place] [address]    Special Requests   Final    BOTTLES DRAWN AEROBIC ONLY BACTEROIDES CACCAE Performed at [facility], [place] [address] [place], [place] [address]    Culture   Final    NO GROWTH 3 DAYS Performed at [facility], [place] [address] [place], [place] [address]    Report Status PENDING  Incomplete     Studies: NM Myocar Multi W/Spect W/Wall Motion / EF  Result Date: 01/09/2020  There was no ST segment deviation noted during stress.  Defect 1: There is a medium defect of mild severity present in the basal inferolateral, mid inferolateral and apical lateral location.  This is an intermediate risk study.  The left ventricular ejection fraction is mildly decreased (45-54%).  Nuclear stress EF: 51%.  Intermediate risk study due to reversible inferolateral ischemia (moderate extent, mild severity) and mildly depressed left ventricular systolic  function.   US Paracentesis  Result Date: 01/08/2020 INDICATION: Patient with history of alcoholic hepatitis, CHF, ascites. Request made for therapeutic paracentesis. EXAM: ULTRASOUND GUIDED THERAPEUTIC PARACENTESIS MEDICATIONS: None COMPLICATIONS: None immediate. PROCEDURE: Informed written consent was obtained from the patient after a discussion of the risks, benefits and alternatives to treatment. A timeout was performed prior to the initiation of the procedure. Initial ultrasound scanning demonstrates a moderate amount of ascites within the right lower abdominal quadrant. The right lower abdomen was prepped and draped in the usual sterile fashion. 1% lidocaine was used for local anesthesia. Following this, a 19 gauge, 7-cm, Yueh catheter was introduced. An ultrasound image was saved for documentation purposes. The paracentesis was performed. The catheter was removed and a dressing was applied. The patient tolerated the procedure well without immediate post procedural complication. Patient received post-procedure intravenous albumin; see nursing notes for details. FINDINGS: A total of approximately 3.1 liters of yellow fluid was removed. IMPRESSION: Successful ultrasound-guided therapeutic paracentesis yielding 3.1 liters of peritoneal fluid. Read by: Rowe Robert, PA-C Electronically Signed   By: Jerilynn Mages.  Shick M.D.   On: 01/08/2020 15:16      Flora Lipps, MD  [facility] 01/10/2020

## 2020-01-10 NOTE — Progress Notes (Signed)
Patient ID: Mykah Shin, male   DOB: March 23, 1966, 54 y.o.   MRN: [id number]    Progress Note   Subjective   Day # 3  CC; alcoholic hepatitis, edema  Prednisolone, day 2  INR 2.1 WBC 26.2, hemoglobin 11.5, platelets 288 Potassium 4.8 T bili 7.5/alk phos 120/AST 57/ALT 31  Nuc med Myoview stress test yesterday-medium defect inferolateral and apical lateral location, EF 45-54 Cardiology planning cath on Monday  Patient says he feels okay, but abdomen tight and uncomfortable again.    Objective   Vital signs in last 24 hours: Temp:  [97.7 F (36.5 C)-97.8 F (36.6 C)] 97.7 F (36.5 C) (04/09 0426) Pulse Rate:  [67-79] 74 (04/09 0426) Resp:  [17] 17 (04/09 0426) BP: (99-109)/(47-68) 99/68 (04/09 0426) SpO2:  [91 %] 91 % (04/09 0426) Weight:  [55.4 kg] 55.4 kg (04/09 0603)   General:    Thin frail appearing African-American male in NAD, icteric Heart:  Regular rate and rhythm; no murmurs Lungs: Respirations even and unlabored, lungs CTA bilaterally Abdomen: Protuberant with fairly tense ascites, mild diffuse tenderness. Normal bowel sounds. Extremities: 1+ edema in the ankles. Neurologic:  Alert and oriented,  grossly normal neurologically, no asterixis Psych:  Cooperative. Normal mood and affect.  Intake/Output from previous day: 04/08 0701 - 04/09 0700 In: 226.6 [IV Piggyback:226.6] Out: 400 [Urine:400] Intake/Output this shift: No intake/output data recorded.  Lab Results: Recent Labs    01/08/20 0600 01/09/20 0603 01/10/20 0555  WBC 23.0* 22.8* 26.7*  HGB 9.7* 9.3* 11.5*  HCT 28.0* 27.1* 34.9*  PLT 254 225 288   BMET Recent Labs    01/08/20 0600 01/09/20 0603 01/10/20 0555  NA 134* 131* 133*  K 3.1* 3.7 4.8  CL 97* 97* 94*  CO2 '27 27 24  ' GLUCOSE 95 92 128*  BUN '7 6 9  ' CREATININE 0.57* 0.54* 0.70  CALCIUM 7.8* 7.8* 8.6*   LFT Recent Labs    01/10/20 0555  PROT 6.5  ALBUMIN 2.1*  AST 57*  ALT 31  ALKPHOS 120  BILITOT 7.5*    PT/INR Recent Labs    01/09/20 0603 01/10/20 0555  LABPROT 24.4* 23.2*  INR 2.2* 2.1*    Studies/Results: NM Myocar Multi W/Spect W/Wall Motion / EF  Result Date: 01/09/2020  There was no ST segment deviation noted during stress.  Defect 1: There is a medium defect of mild severity present in the basal inferolateral, mid inferolateral and apical lateral location.  This is an intermediate risk study.  The left ventricular ejection fraction is mildly decreased (45-54%).  Nuclear stress EF: 51%.  Intermediate risk study due to reversible inferolateral ischemia (moderate extent, mild severity) and mildly depressed left ventricular systolic function.   US Paracentesis  Result Date: 01/08/2020 INDICATION: Patient with history of alcoholic hepatitis, CHF, ascites. Request made for therapeutic paracentesis. EXAM: ULTRASOUND GUIDED THERAPEUTIC PARACENTESIS MEDICATIONS: None COMPLICATIONS: None immediate. PROCEDURE: Informed written consent was obtained from the patient after a discussion of the risks, benefits and alternatives to treatment. A timeout was performed prior to the initiation of the procedure. Initial ultrasound scanning demonstrates a moderate amount of ascites within the right lower abdominal quadrant. The right lower abdomen was prepped and draped in the usual sterile fashion. 1% lidocaine was used for local anesthesia. Following this, a 19 gauge, 7-cm, Yueh catheter was introduced. An ultrasound image was saved for documentation purposes. The paracentesis was performed. The catheter was removed and a dressing was applied. The patient tolerated the procedure  well without immediate post procedural complication. Patient received post-procedure intravenous albumin; see nursing notes for details. FINDINGS: A total of approximately 3.1 liters of yellow fluid was removed. IMPRESSION: Successful ultrasound-guided therapeutic paracentesis yielding 3.1 liters of peritoneal fluid. Read by: Rowe Robert, PA-C Electronically Signed   By: Jerilynn Mages.  Shick M.D.   On: 01/08/2020 15:16       Assessment / Plan:    #32 54 year old African-American male alcoholic with severe alcoholic hepatitis. He stopped drinking about a month ago, but has had some worsening of parameters. He does not have cirrhosis by imaging  DF greater than 32 this admission, so initiated prednisolone 40 mg daily yesterday Will follow labs and determine in 1 week whether to continue steroids.   #2 ascites and peripheral edema-had not been on any diuretics previously Status post paracentesis with removal of 3 L earlier this week. Ascites has reaccumulated and is fairly tense.  Will schedule for repeat paracentesis today, to remove 4 to 5 L. Will start Lasix 40 mg p.o. daily and Aldactone 100 mg p.o. daily Watch renal function Continue low-sodium diet  #3 abnormal Myoview in patient with known cardiomyopathy. He is scheduled for cardiac catheterization on Monday  #4 coagulopathy-continue oral vitamin K  Plan; as above Please continue to follow serial labs. GI will see again on Monday, please call in the interim for problems.   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

## 2020-01-11 LAB — CBC WITH DIFFERENTIAL/PLATELET
Abs Immature Granulocytes: 0.14 10*3/uL — ABNORMAL HIGH (ref 0.00–0.07)
Basophils Absolute: 0 10*3/uL (ref 0.0–0.1)
Basophils Relative: 0 %
Eosinophils Absolute: 0.1 10*3/uL (ref 0.0–0.5)
Eosinophils Relative: 0 %
HCT: 33.7 % — ABNORMAL LOW (ref 39.0–52.0)
Hemoglobin: 11.1 g/dL — ABNORMAL LOW (ref 13.0–17.0)
Immature Granulocytes: 1 %
Lymphocytes Relative: 7 %
Lymphs Abs: 1.7 10*3/uL (ref 0.7–4.0)
MCH: 30.7 pg (ref 26.0–34.0)
MCHC: 32.9 g/dL (ref 30.0–36.0)
MCV: 93.4 fL (ref 80.0–100.0)
Monocytes Absolute: 1.6 10*3/uL — ABNORMAL HIGH (ref 0.1–1.0)
Monocytes Relative: 7 %
Neutro Abs: 20.7 10*3/uL — ABNORMAL HIGH (ref 1.7–7.7)
Neutrophils Relative %: 85 %
Platelets: 259 10*3/uL (ref 150–400)
RBC: 3.61 MIL/uL — ABNORMAL LOW (ref 4.22–5.81)
RDW: 20 % — ABNORMAL HIGH (ref 11.5–15.5)
WBC: 24.3 10*3/uL — ABNORMAL HIGH (ref 4.0–10.5)
nRBC: 0 % (ref 0.0–0.2)

## 2020-01-11 LAB — COMPREHENSIVE METABOLIC PANEL
ALT: 28 U/L (ref 0–44)
AST: 56 U/L — ABNORMAL HIGH (ref 15–41)
Albumin: 2.7 g/dL — ABNORMAL LOW (ref 3.5–5.0)
Alkaline Phosphatase: 135 U/L — ABNORMAL HIGH (ref 38–126)
Anion gap: 12 (ref 5–15)
BUN: 9 mg/dL (ref 6–20)
CO2: 22 mmol/L (ref 22–32)
Calcium: 8.8 mg/dL — ABNORMAL LOW (ref 8.9–10.3)
Chloride: 98 mmol/L (ref 98–111)
Creatinine, Ser: 0.58 mg/dL — ABNORMAL LOW (ref 0.61–1.24)
GFR calc Af Amer: >60 mL/min (ref >60)
GFR calc non Af Amer: >60 mL/min (ref >60)
Glucose, Bld: 104 mg/dL — ABNORMAL HIGH (ref 70–99)
Potassium: 4.1 mmol/L (ref 3.5–5.1)
Sodium: 132 mmol/L — ABNORMAL LOW (ref 135–145)
Total Bilirubin: 6 mg/dL — ABNORMAL HIGH (ref 0.3–1.2)
Total Protein: 6.7 g/dL (ref 6.5–8.1)

## 2020-01-11 LAB — PROTIME-INR
INR: 1.9 — ABNORMAL HIGH (ref 0.8–1.2)
Prothrombin Time: 22 seconds — ABNORMAL HIGH (ref 11.4–15.2)

## 2020-01-11 LAB — LIPID PANEL
Cholesterol: 134 mg/dL (ref 0–200)
HDL: <10 mg/dL — ABNORMAL LOW (ref >40)
Triglycerides: 149 mg/dL (ref <150)
VLDL: 30 mg/dL (ref 0–40)

## 2020-01-11 LAB — PHOSPHORUS: Phosphorus: 1.8 mg/dL — ABNORMAL LOW (ref 2.5–4.6)

## 2020-01-11 LAB — MAGNESIUM: Magnesium: 1.6 mg/dL — ABNORMAL LOW (ref 1.7–2.4)

## 2020-01-11 MED ORDER — LACTULOSE 10 GM/15ML PO SOLN
20.0000 g | Freq: Two times a day (BID) | ORAL | Status: DC
  Administered 2020-01-12 – 2020-01-16 (×7): 20 g via ORAL
  Filled 2020-01-11 (×10): qty 30

## 2020-01-11 MED ORDER — MAGNESIUM SULFATE 2 GM/50ML IV SOLN
2.0000 g | Freq: Once | INTRAVENOUS | Status: AC
  Administered 2020-01-11: 2 g via INTRAVENOUS
  Filled 2020-01-11: qty 50

## 2020-01-11 MED ORDER — POTASSIUM & SODIUM PHOSPHATES 280-160-250 MG PO PACK
1.0000 | PACK | Freq: Three times a day (TID) | ORAL | Status: DC
  Administered 2020-01-11 – 2020-01-13 (×5): 1 via ORAL
  Filled 2020-01-11 (×10): qty 1

## 2020-01-11 NOTE — Progress Notes (Signed)
Progress Note  Patient Name: Richard [name] Date of Encounter: 01/11/2020  Primary Cardiologist: Jenkins Rouge, MD   Subjective   Denies any chest pain or SOB.    Inpatient Medications    Scheduled Meds: . fentaNYL (SUBLIMAZE) injection  25 mcg Intravenous Q3H while awake  . folic acid  1 mg Oral Daily  . furosemide  40 mg Oral Daily  . lactulose  10 g Oral BID  . magnesium oxide  400 mg Oral BID  . multivitamin with minerals  1 tablet Oral Daily  . nicotine  21 mg Transdermal Daily  . pantoprazole  40 mg Oral BID AC  . prednisoLONE  40 mg Oral Daily  . sodium chloride flush  3 mL Intravenous Q12H  . sodium chloride flush  3 mL Intravenous Q12H  . spironolactone  100 mg Oral Daily  . thiamine  100 mg Oral Daily   Continuous Infusions: . sodium chloride    . cefTRIAXone (ROCEPHIN)  IV 2 g (01/10/20 2225)   PRN Meds: sodium chloride, LORazepam, sodium chloride flush   Vital Signs    Vitals:   01/10/20 1354 01/10/20 2037 01/11/20 0452 01/11/20 0457  BP: 105/69 99/70 118/80   Pulse: 76 74 89   Resp: 20 16 16    Temp: 97.6 F (36.4 C) 97.7 F (36.5 C) 97.6 F (36.4 C)   TempSrc:   Oral   SpO2: 95% 93% 96%   Weight:    53.3 kg  Height:        Intake/Output Summary (Last 24 hours) at 01/11/2020 0957 Last data filed at 01/11/2020 0848 Gross per 24 hour  Intake 560 ml  Output 650 ml  Net -90 ml   Filed Weights   01/09/20 0615 01/10/20 0603 01/11/20 0457  Weight: 54.7 kg 55.4 kg 53.3 kg    Telemetry    NSR - Personally Reviewed  ECG    No new EKG to review- Personally Reviewed  Physical Exam   GEN: Well nourished, well developed in no acute distress HEENT: Normal NECK: No JVD; No carotid bruits LYMPHATICS: No lymphadenopathy CARDIAC:RRR, no murmurs, rubs, gallops RESPIRATORY:  Clear to auscultation without rales, wheezing or rhonchi  ABDOMEN: Soft, non-tender, non-distended MUSCULOSKELETAL:  No edema; No deformity  SKIN: Warm and  dry NEUROLOGIC:  Alert and oriented x 3 PSYCHIATRIC:  Normal affect    Labs    Chemistry Recent Labs  Lab 01/09/20 0603 01/10/20 0555 01/11/20 0640  NA 131* 133* 132*  K 3.7 4.8 4.1  CL 97* 94* 98  CO2 27 24 22   GLUCOSE 92 128* 104*  BUN 6 9 9   CREATININE 0.54* 0.70 0.58*  CALCIUM 7.8* 8.6* 8.8*  PROT 5.1* 6.5 6.7  ALBUMIN 1.8* 2.1* 2.7*  AST 56* 57* 56*  ALT 22 31 28   ALKPHOS 89 120 135*  BILITOT 7.1* 7.5* 6.0*  GFRNONAA >60 >60 >60  GFRAA >60 >60 >60  ANIONGAP 7 15 12      Hematology Recent Labs  Lab 01/09/20 0603 01/10/20 0555 01/11/20 0640  WBC 22.8* 26.7* 24.3*  RBC 2.93* 3.74* 3.61*  HGB 9.3* 11.5* 11.1*  HCT 27.1* 34.9* 33.7*  MCV 92.5 93.3 93.4  MCH 31.7 30.7 30.7  MCHC 34.3 33.0 32.9  RDW 19.3* 19.8* 20.0*  PLT 225 288 259    Cardiac EnzymesNo results for input(s): TROPONINI in the last 168 hours. No results for input(s): TROPIPOC in the last 168 hours.   BNP Recent Labs  Lab 01/07/20  1345  BNP 211.4*     DDimer No results for input(s): DDIMER in the last 168 hours.   Radiology    NM Myocar Multi W/Spect W/Wall Motion / EF  Result Date: 01/09/2020  There was no ST segment deviation noted during stress.  Defect 1: There is a medium defect of mild severity present in the basal inferolateral, mid inferolateral and apical lateral location.  This is an intermediate risk study.  The left ventricular ejection fraction is mildly decreased (45-54%).  Nuclear stress EF: 51%.  Intermediate risk study due to reversible inferolateral ischemia (moderate extent, mild severity) and mildly depressed left ventricular systolic function.   US Paracentesis  Result Date: 01/10/2020 INDICATION: Patient with history of alcoholic hepatitis, CHF, recurrent ascites. Request made for therapeutic paracentesis. EXAM: ULTRASOUND GUIDED THERAPEUTIC PARACENTESIS MEDICATIONS: None COMPLICATIONS: None immediate. PROCEDURE: Informed written consent was obtained from the  patient after a discussion of the risks, benefits and alternatives to treatment. A timeout was performed prior to the initiation of the procedure. Initial ultrasound scanning demonstrates a moderate amount of ascites within the right mid to lower abdominal quadrant. The right mid to lower abdomen was prepped and draped in the usual sterile fashion. 1% lidocaine was used for local anesthesia. Following this, a 19 gauge, 7-cm, Yueh catheter was introduced. An ultrasound image was saved for documentation purposes. The paracentesis was performed. The catheter was removed and a dressing was applied. The patient tolerated the procedure well without immediate post procedural complication. Patient received post-procedure intravenous albumin; see nursing notes for details. FINDINGS: A total of approximately 2.9 liters of yellow fluid was removed. IMPRESSION: Successful ultrasound-guided therapeutic paracentesis yielding 2.9 liters of peritoneal fluid. Read by: Rowe Robert, PA-C Electronically Signed   By: Jacqulynn Cadet M.D.   On: 01/10/2020 15:54    Cardiac Studies   Echo 11/2018  1. The left ventricle has moderate-severely reduced systolic function of  99991111. The cavity size was normal. There is no increased left ventricular  wall thickness. Echo evidence of impaired diastolic relaxation Left  ventrical global hypokinesis without  regional wall motion abnormalities.  2. The right ventricle has mildly reduced systolic function. The cavity  was mildly enlarged. There is no increase in right ventricular wall  thickness.  3. The mitral valve is normal in structure. No evidence of mitral valve  stenosis. No significant mitral regurgitation.  4. The tricuspid valve is normal in structure.  5. The aortic valve is tricuspid. No aortic stenosis.  6. The aortic root and ascending aorta are normal in size and structure.  7. The inferior vena cava is normal in size with greater than 50%  respiratory  variability.  8. No complete TR doppler jet so unable to estimate PA systolic pressure.  Echocardiogram 01/07/20: 1. Left ventricular ejection fraction, by estimation, is 60 to 65%. The  left ventricle has normal function. The left ventricle has no regional  wall motion abnormalities. Left ventricular diastolic parameters were  normal.  2. Right ventricular systolic function is normal. The right ventricular  size is normal.  3. The mitral valve is normal in structure. No evidence of mitral valve  regurgitation. No evidence of mitral stenosis.  4. The aortic valve is normal in structure. Aortic valve regurgitation is  not visualized. No aortic stenosis is present.  5. The inferior vena cava is normal in size with greater than 50%  respiratory variability, suggesting right atrial pressure of 3 mmHg.  NST 01/09/2020:  There was no ST  segment deviation noted during stress.  Defect 1: There is a medium defect of mild severity present in the basal inferolateral, mid inferolateral and apical lateral location.  This is an intermediate risk study.  The left ventricular ejection fraction is mildly decreased (45-54%).  Nuclear stress EF: 51%.   Intermediate risk study due to reversible inferolateral ischemia (moderate extent, mild severity) and mildly depressed left ventricular systolic function.  Patient Profile     54 y.o. male with a hx of alcohol abuse and withdrawal, colon cancer status post surgery 2007 without follow-up, PUD, GERD, alcoholic pancreatitis, chronic abdominal pain, hypertension, SVT, chronic systolic heart failure (EF 30-35% in 11/2018)who is being seen today for the evaluation of heart failure and prolonged Qt  Assessment & Plan    1. Chronic diastolic CHF: EF 99991111 on echo 11/2018 with G1DD, no RWMA, and mildly reduced RV systolic function; improved with EF 60-65% on echo this admission. CM felt to be 2/2 alcohol abuse with possible tachycardia mediated component as  well as uncontrolled HTN. Hypoalbuminemia has contributed to ascites. BNP was 211 this admission. Hypotension has limited management. He had left-sided chest pain complaints  -NST 01/09/20 revealed a reversible inferolateral defect. He is here with alcoholic hepatitis c/b coagulopathy with INR improved to 1.9 today after Vit K - Weight down 5lbs from yesterday  I&Os are incomplete - Will tentatively place on the schedule for [facility] 01/13/20 pending improvement in INR - LDL could not be calculated but total Chol 134 and HDL<10, TAG 149 >> will check direct LDL -continue lasix 40mg  daily and spiro 100mg  daily -avoid ASA for now in setting of hypercoagulable state  2. Alcoholic hepatitis: patient presented with abdominal pain/distention and LEE. He has had hyperbilirubinemia, leukocytosis, coagulopathy, and ascites but no SBP and recent imaging negative for cirrhosis or varices. He was started on prednisolone for management with plans for a 28 day course. He underwent a paracentesis 01/08/20 with 3.1L of fluid removed. Again with abdominal distention today - Continue management per GI and primary team - Continue to encourage ETOH abstinence   3. Prolonged QT interval: QTc 518 in the ED. Likely 2/2 metabolic derangement (K 2.4 on admission) - QTc on 4/6 inaccurate as T wave is not well demarcated making accurate assessment difficult - Avoid QT prolonging medication  4. HTN: more recently hypotension this admission. Limits management of #1 - BP controlled at 118/66mmHg  5.  Hypercoagulable state -he is autoanticoagulated due to liver disease -INR down to 1.9>> need to get INR>1.6 over weekend to proceed with cath on Monday -per TRH  I have spent a total of 35 minutes with patient reviewing nuclear stress test, hospital notes , telemetry, EKGs, labs and examining patient as well as establishing an assessment and plan that was discussed with the patient.  > 50% of time was spent in direct patient care.       For questions or updates, please contact [place] Please consult [email or web address] for contact info under Cardiology/STEMI.      Signed, Fransico Him, MD  01/11/2020, 9:57 AM   [phone]

## 2020-01-11 NOTE — Progress Notes (Addendum)
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  Of note, patient was recently discharged from the hospital on 12/14/2019 and ever since then he has not drank alcohol.  He has however noted increasing abdominal distention and lower extremity edema, so he decided come to the hospital.  ED Course:Upon arrival to the ED, patient is found to be afebrile, saturating mid 90s on room air, and with stable blood pressure. Chemistry panel notable for potassium 2.4, albumin of 1.9, AST 70, ALT 26, and bilirubin 9.3. CBC with leukocytosis to 23,700 and stable normocytic anemia. Lactic acid was 2.6. Patient was given 2 g IV Rocephin, 20 mEq IV potassium, fentanyl, and Zofran in the ED. COVID-19 PCR was negative.  Patient was then admitted to the hospital for further evaluation and treatment.   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  No evidence of SBP but ascites has been recurrent.    Abdominal pain has improved. Initial paracentesis on 01/08/2020 with removal of 3.1 L of fluid and  subsequent paracentesis on 01/10/20 with removal of 2.9.  Continue Protonix.  Blood cultures and peritoneal fluid negative so far.High discrimination score so has been started on prednisolone as per GI.    Continue fluid restriction and low-salt diet.    Patient did have a EGD colonoscopy on 3/12 and had esophagitis at that time.  On IV Rocephin due to significant leukocytosis. No fever.  Confusion, disorientation today, likely metabolic encephalopathy..  Patient does have underlying liver disease so there is a possibility of hepatic encephalopathy but patient is getting IV narcotics.  Will discontinue fentanyl.  On Ativan as needed for alcohol withdrawal.  We will continue to monitor.  Continue lactulose.  EtOH cardiomyopathy/Acute on Chronic systolic CHF -EF 30 to [id number] on echocardiogram 11/08/2018. 2/6 Echocardiogram showed ejection fraction at 60 to 65%.  Abnormal myocardial perfusion scan.  Cardiology recommended cardiac catheterization when INR is better.  Patient might need to have placed frozen plasma prior to cardiac catheterization but INR has slightly improved to 1.9 today with vitamin K.  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  On vitamin K daily.  INR 2.1>1.9.  EtOH abuse in remission Patient has not drank alcohol since last admission.  He is motivated to abstain  Marijuana/tobacco use disorder. 4/6 urine drug screen positive marijuana.    Continue nicotine patch   hypomagnesemia We will replace with IV magnesium sulfate. On magnesium oxide. Check level in am. Aim for >2  Anxiety.  Add as needed Ativan.  Prolonged QT interval - QTc is 518 ms in ED.  Negative troponins.  Status post abnormal myocardial perfusion scan.  Plan for cardiac catheterization as per cardiology.  Hypophosphatemia.  Replenished but phosphorus down to 1.8 today.  Will replenish orally. Check levels daily   VTE Prophylaxis: SCD  Code Status: Full code  Family Communication: None   Disposition Plan:   . Patient is from home . Likely disposition to home in 2 to 3 days . Barriers to discharge: Plan for cardiac cath pending INR improvement, encephalopathy   Consultants:  IR  GI  Cardiology  Procedures:  01/07/2020.  Diagnostic paracentesis  01/08/2020.  Therapeutic paracentesis.  01/08/2020.  Myocardial perfusion scan.  Antibiotics:  . Rocephin iv 01/07/20>  Anti-infectives (From admission, onward)   Start     Dose/Rate Route Frequency Ordered Stop   01/07/20 2200  cefTRIAXone (ROCEPHIN) 2 g in sodium chloride 0.9 % 100 mL IVPB     2 g 200 mL/hr over 30 Minutes Intravenous Every 24 hours 01/07/20 1613     01/07/20 0330  cefTRIAXone (ROCEPHIN) 2 g in sodium chloride 0.9 % 100 mL IVPB     2 g 200 mL/hr over 30 Minutes Intravenous  Once 01/07/20 0325 01/07/20 0509      Subjective: Today, patient was seen and examined at bedside.  Nursing staff reported that he was confused and trying to leave and dressed up.  Patient denies any pain nausea vomiting.  Has had a bowel movement yesterday  Objective: Vitals:   01/10/20 2037 01/11/20 0452  BP: 99/70 118/80  Pulse: 74 89  Resp: 16 16  Temp: 97.7 F (36.5 C) 97.6 F (36.4 C)  SpO2: 93% 96%    Intake/Output Summary (Last 24 hours) at 01/11/2020 1313 Last data filed at 01/11/2020 1013 Gross per 24 hour  Intake 800 ml  Output 652 ml  Net 148 ml   Filed Weights   01/09/20 0615 01/10/20 0603 01/11/20 0457  Weight: 54.7 kg 55.4 kg 53.3 kg   Body mass index is 15.92 kg/m.   Physical Exam:  General: Thinly built, alert awake communicative.  Confused and disoriented but not agitated. HENT: Normocephalic, pupils equally reacting to light and accommodation.  Icteric. Chest:  Clear breath sounds.  Diminished breath sounds bilaterally. No crackles or wheezes.  CVS: S1 &S2 heard. No murmur.  Regular rate and rhythm. Abdomen: Soft, mildly distended abdomen,.  Bowel sounds are heard.   Extremities: No cyanosis, clubbing with  trace edema.  Peripheral pulses are palpable. Psych: Confused and disoriented CNS:  No cranial nerve deficits.  Power equal in all extremities.  No sensory deficits noted.  Skin: Warm and dry.  No rashes noted.   Data Review: I have personally reviewed the following laboratory data and studies,  CBC: Recent Labs  Lab 01/07/20 0944 01/08/20 0600 01/09/20 0603 01/10/20 0555 01/11/20 0640  WBC 22.8* 23.0* 22.8* 26.7* 24.3*  NEUTROABS 18.9* 19.0* 18.0* 23.8* 20.7*  HGB 10.2* 9.7* 9.3* 11.5* 11.1*  HCT 30.0* 28.0* 27.1* 34.9* 33.7*  MCV 91.5 90.6 92.5 93.3 93.4  PLT 250 254 225 288 [id number]   Basic Metabolic Panel: Recent Labs  Lab 01/07/20 0944 01/07/20 0944 01/07/20 0946 01/08/20 0600 01/09/20 0603 01/10/20 0555 01/11/20 0640  NA 134*   < > 133* 134* 131* 133* 132*  K 3.1*   < > 3.1* 3.1* 3.7 4.8 4.1  CL 96*   < > 97* 97* 97* 94* 98  CO2 26   < > 29 27 27 24 22   GLUCOSE 87   < > 92  95 92 128* 104*  BUN 10   < > 10 7 6 9 9   CREATININE 0.67   < > 0.64 0.57* 0.54* 0.70 0.58*  CALCIUM 7.6*   < > 7.6* 7.8* 7.8* 8.6* 8.8*  MG 1.6*  --   --  1.5* 1.7 1.7 1.6*  PHOS 3.0  --   --  1.7* 1.6* 2.6 1.8*   < > = values in this interval not displayed.   Liver Function Tests: Recent Labs  Lab 01/07/20 0944 01/08/20 0600 01/09/20 0603 01/10/20 0555 01/11/20 0640  AST 65* 62* 56* 57* 56*  ALT 23 22 22 31 28   ALKPHOS 101 98 89 120 135*  BILITOT 8.4* 8.1* 7.1* 7.5* 6.0*  PROT 5.6* 5.5* 5.1* 6.5 6.7  ALBUMIN 1.7* 1.8* 1.8* 2.1* 2.7*   Recent Labs  Lab 01/07/20 0129  LIPASE 19   Recent Labs  Lab 01/07/20 0129  AMMONIA 69*   Cardiac Enzymes: No results for input(s): CKTOTAL, CKMB, CKMBINDEX, TROPONINI in the last 168 hours. BNP (last 3 results) Recent Labs    01/07/20 1345  BNP 211.4*    ProBNP (last 3 results) No results for input(s): PROBNP in the last 8760 hours.  CBG: No results for input(s): GLUCAP in the last 168 hours. Recent Results (from the past 240  hour(s))  SARS CORONAVIRUS 2 (TAT 6-24 HRS) Nasopharyngeal Nasopharyngeal Swab     Status: None   Collection Time: 01/07/20  2:25 AM   Specimen: Nasopharyngeal Swab  Result Value Ref Range Status   SARS Coronavirus 2 NEGATIVE NEGATIVE Final    Comment: (NOTE) SARS-CoV-2 target nucleic acids are NOT DETECTED. The SARS-CoV-2 RNA is generally detectable in upper and lower respiratory specimens during the acute phase of infection. Negative results do not preclude SARS-CoV-2 infection, do not rule out co-infections with other pathogens, and should not be used as the sole basis for treatment or other patient management decisions. Negative results must be combined with clinical observations, patient history, and epidemiological information. The expected result is Negative. Fact Sheet for Patients: SugarRoll.be Fact Sheet for Healthcare Providers: https://www.woods-mathews.com/ This test is not yet approved or cleared by the [place] FDA and  has been authorized for detection and/or diagnosis of SARS-CoV-2 by FDA under an Emergency Use Authorization (EUA). This EUA will remain  in effect (meaning this test can be used) for the duration of the COVID-19 declaration under Section 56 4(b)(1) of the Act, 21 U.S.C. section 360bbb-3(b)(1), unless the authorization is terminated or revoked sooner. Performed at [facility], [place] [address] [place], [place] [address]   Body fluid culture     Status: None   Collection Time: 01/07/20  5:53 AM   Specimen: Peritoneal Cavity; Peritoneal Fluid  Result Value Ref Range Status   Specimen Description   Final    PERITONEAL CAVITY Performed at [place] [address] [place], [place] [address]    Special Requests   Final    NONE Performed at [facility], [place] [address], [place], [place] [address]    Gram Stain   Final    WBC PRESENT,BOTH PMN AND  MONONUCLEAR NO ORGANISMS SEEN Gram Stain Report Called to,Read Back By and Verified With: Mee Hives @ [phone] 01/07/2020 Performed at [facility], [place] [address] [place], [place] [address]    Culture   Final    NO GROWTH 3 DAYS Performed at [facility], [place],  [place] [address]    Report Status 01/10/2020 FINAL  Final  Culture, blood (routine x 2)     Status: None (Preliminary result)   Collection Time: 01/07/20  9:44 AM   Specimen: BLOOD RIGHT HAND  Result Value Ref Range Status   Specimen Description   Final    BLOOD RIGHT HAND Performed at [place] [address] [place], [place] [address]    Special Requests   Final    BOTTLES DRAWN AEROBIC ONLY Blood Culture results may not be optimal due to an inadequate volume of blood received in culture bottles Performed at [place] [place] [address], [place], [place] [address]    Culture   Final    NO GROWTH 4 DAYS Performed at [facility], [place] [address], [place], [place] [address]    Report Status PENDING  Incomplete  Culture, blood (routine x 2)     Status: None (Preliminary result)   Collection Time: 01/07/20  9:46 AM   Specimen: BLOOD  Result Value Ref Range Status   Specimen Description   Final    BLOOD LEFT ARM Performed at [place] [address], [place], [place] [address]    Special Requests   Final    BOTTLES DRAWN AEROBIC ONLY BACTEROIDES CACCAE Performed at [facility], [place] [address] [place], [place] [address]    Culture   Final    NO GROWTH 4 DAYS Performed at [facility], [place] [address] [place],  [address]    Report Status PENDING  Incomplete     Studies: NM Myocar Multi W/Spect W/Wall Motion / EF  Result Date: 01/09/2020  There was no ST segment deviation noted during stress.  Defect 1: There is a medium defect of mild severity present in the basal  inferolateral, mid inferolateral and apical lateral location.  This is an intermediate risk study.  The left ventricular ejection fraction is mildly decreased (45-54%).  Nuclear stress EF: 51%.  Intermediate risk study due to reversible inferolateral ischemia (moderate extent, mild severity) and mildly depressed left ventricular systolic function.   US Paracentesis  Result Date: 01/10/2020 INDICATION: Patient with history of alcoholic hepatitis, CHF, recurrent ascites. Request made for therapeutic paracentesis. EXAM: ULTRASOUND GUIDED THERAPEUTIC PARACENTESIS MEDICATIONS: None COMPLICATIONS: None immediate. PROCEDURE: Informed written consent was obtained from the patient after a discussion of the risks, benefits and alternatives to treatment. A timeout was performed prior to the initiation of the procedure. Initial ultrasound scanning demonstrates a moderate amount of ascites within the right mid to lower abdominal quadrant. The right mid to lower abdomen was prepped and draped in the usual sterile fashion. 1% lidocaine was used for local anesthesia. Following this, a 19 gauge, 7-cm, Yueh catheter was introduced. An ultrasound image was saved for documentation purposes. The paracentesis was performed. The catheter was removed and a dressing was applied. The patient tolerated the procedure well without immediate post procedural complication. Patient received post-procedure intravenous albumin; see nursing notes for details. FINDINGS: A total of approximately 2.9 liters of yellow fluid was removed. IMPRESSION: Successful ultrasound-guided therapeutic paracentesis yielding 2.9 liters of peritoneal fluid. Read by: Rowe Robert, PA-C Electronically Signed   By: Jacqulynn Cadet M.D.   On: 01/10/2020 15:54      Flora Lipps, MD  [facility] 01/11/2020

## 2020-01-11 NOTE — Progress Notes (Signed)
Pt continues to be confused and attempting to leave several times. Nursing staff able to reorient pt well overall. Md aware of this confusion.

## 2020-01-11 NOTE — Plan of Care (Signed)
  Problem: Clinical Measurements: Goal: Respiratory complications will improve Outcome: Progressing   Problem: Clinical Measurements: Goal: Cardiovascular complication will be avoided Outcome: Progressing   Problem: Activity: Goal: Risk for activity intolerance will decrease Outcome: Progressing   Problem: Nutrition: Goal: Adequate nutrition will be maintained Outcome: Progressing   Problem: Coping: Goal: Level of anxiety will decrease Outcome: Progressing   Problem: Elimination: Goal: Will not experience complications related to bowel motility Outcome: Progressing   Problem: Pain Managment: Goal: General experience of comfort will improve Outcome: Progressing   Problem: Safety: Goal: Ability to remain free from injury will improve Outcome: Progressing

## 2020-01-12 DIAGNOSIS — K860 Alcohol-induced chronic pancreatitis: Secondary | ICD-10-CM

## 2020-01-12 DIAGNOSIS — D72829 Elevated white blood cell count, unspecified: Secondary | ICD-10-CM

## 2020-01-12 DIAGNOSIS — Z720 Tobacco use: Secondary | ICD-10-CM

## 2020-01-12 DIAGNOSIS — K652 Spontaneous bacterial peritonitis: Secondary | ICD-10-CM

## 2020-01-12 DIAGNOSIS — G9341 Metabolic encephalopathy: Secondary | ICD-10-CM

## 2020-01-12 DIAGNOSIS — F1910 Other psychoactive substance abuse, uncomplicated: Secondary | ICD-10-CM

## 2020-01-12 LAB — CBC WITH DIFFERENTIAL/PLATELET
Abs Immature Granulocytes: 0.08 10*3/uL — ABNORMAL HIGH (ref 0.00–0.07)
Basophils Absolute: 0.1 10*3/uL (ref 0.0–0.1)
Basophils Relative: 0 %
Eosinophils Absolute: 0.2 10*3/uL (ref 0.0–0.5)
Eosinophils Relative: 1 %
HCT: 29.3 % — ABNORMAL LOW (ref 39.0–52.0)
Hemoglobin: 10 g/dL — ABNORMAL LOW (ref 13.0–17.0)
Immature Granulocytes: 0 %
Lymphocytes Relative: 6 %
Lymphs Abs: 1.2 10*3/uL (ref 0.7–4.0)
MCH: 31 pg (ref 26.0–34.0)
MCHC: 34.1 g/dL (ref 30.0–36.0)
MCV: 90.7 fL (ref 80.0–100.0)
Monocytes Absolute: 1.3 10*3/uL — ABNORMAL HIGH (ref 0.1–1.0)
Monocytes Relative: 7 %
Neutro Abs: 16.4 10*3/uL — ABNORMAL HIGH (ref 1.7–7.7)
Neutrophils Relative %: 86 %
Platelets: 216 10*3/uL (ref 150–400)
RBC: 3.23 MIL/uL — ABNORMAL LOW (ref 4.22–5.81)
RDW: 19.3 % — ABNORMAL HIGH (ref 11.5–15.5)
WBC: 19.2 10*3/uL — ABNORMAL HIGH (ref 4.0–10.5)
nRBC: 0 % (ref 0.0–0.2)

## 2020-01-12 LAB — COMPREHENSIVE METABOLIC PANEL WITH GFR
ALT: 46 U/L — ABNORMAL HIGH (ref 0–44)
AST: 118 U/L — ABNORMAL HIGH (ref 15–41)
Albumin: 2.2 g/dL — ABNORMAL LOW (ref 3.5–5.0)
Alkaline Phosphatase: 115 U/L (ref 38–126)
Anion gap: 8 (ref 5–15)
BUN: 9 mg/dL (ref 6–20)
CO2: 25 mmol/L (ref 22–32)
Calcium: 8.5 mg/dL — ABNORMAL LOW (ref 8.9–10.3)
Chloride: 99 mmol/L (ref 98–111)
Creatinine, Ser: 0.52 mg/dL — ABNORMAL LOW (ref 0.61–1.24)
GFR calc Af Amer: >60 mL/min
GFR calc non Af Amer: >60 mL/min
Glucose, Bld: 81 mg/dL (ref 70–99)
Potassium: 3.5 mmol/L (ref 3.5–5.1)
Sodium: 132 mmol/L — ABNORMAL LOW (ref 135–145)
Total Bilirubin: 6.2 mg/dL — ABNORMAL HIGH (ref 0.3–1.2)
Total Protein: 5.8 g/dL — ABNORMAL LOW (ref 6.5–8.1)

## 2020-01-12 LAB — CULTURE, BLOOD (ROUTINE X 2)
Culture: NO GROWTH
Culture: NO GROWTH

## 2020-01-12 LAB — PHOSPHORUS: Phosphorus: 3.3 mg/dL (ref 2.5–4.6)

## 2020-01-12 LAB — PROTIME-INR
INR: 2 — ABNORMAL HIGH (ref 0.8–1.2)
Prothrombin Time: 23 s — ABNORMAL HIGH (ref 11.4–15.2)

## 2020-01-12 LAB — MAGNESIUM: Magnesium: 1.8 mg/dL (ref 1.7–2.4)

## 2020-01-12 MED ORDER — PHYTONADIONE 5 MG PO TABS
5.0000 mg | ORAL_TABLET | Freq: Once | ORAL | Status: AC
  Administered 2020-01-12: 5 mg via ORAL
  Filled 2020-01-12: qty 1

## 2020-01-12 NOTE — Progress Notes (Signed)
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  Of note, patient was recently discharged from the hospital on 12/14/2019 and ever since then he has not drank alcohol.  He has however noted increasing abdominal distention and lower extremity edema, so he decided come to the hospital.  ED Course:Upon arrival to the ED, patient is found to be afebrile, saturating mid 90s on room air, and with stable blood pressure. Chemistry panel notable for potassium 2.4, albumin of 1.9, AST 70, ALT 26, and bilirubin 9.3. CBC with leukocytosis to 23,700 and stable normocytic anemia. Lactic acid was 2.6. Patient was given 2 g IV Rocephin, 20 mEq IV potassium, fentanyl, and Zofran in the ED. COVID-19 PCR was negative.  Patient was then admitted to the hospital for further evaluation and treatment.   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  No evidence of SBP but ascites has been recurrent.    Abdominal pain has improved. Initial paracentesis on 01/08/2020 with removal of 3.1 L of fluid and  subsequent paracentesis on 01/10/20 with removal of 2.9 L.  Continue Protonix.  Blood cultures and peritoneal fluid negative so far.High discrimination score so has been started on prednisolone as per GI.    Continue fluid restriction and low-salt diet.    Patient did have a EGD colonoscopy on 3/12 which showed esophagitis at that time.  On IV Rocephin due to significant leukocytosis. No fever.  Will discontinue after today's dose.  Patient has been started on Lasix 40 daily, spironolactone 100 mg daily, thiamine and folic acid.  We will continue to monitor electrolytes closely.  Confusion, disorientation ikely metabolic encephalopathy. off fentanyl.  Improved.  On Ativan as needed for agitation anxiety with history of alcohol abuse. we will continue to monitor.  Continue lactulose.  Check ammonia for baseline levels.  EtOH cardiomyopathy/Acute on Chronic systolic CHF/prolonged QTC -EF 30 to 35% on echocardiogram 11/08/2018. 2/6 Echocardiogram showed ejection fraction at 60 to 65%.  Abnormal myocardial perfusion scan.  Cardiology recommend cardiac catheterization when INR is better.  INR of 2.0 today.  Receiving vitamin K for the last 3 4 days.  Only option is for his frozen plasma prior to cardiac catheterization.  Spoke with cardiology about it.   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  Received vitamin K daily.  INR 2.1>1.9>2.0.  Consider FFP prior to cardiac catheterization.  EtOH abuse in remission Patient has not drank alcohol since last admission.  He is motivated to abstain  Marijuana/tobacco use disorder. 4/6 urine drug screen positive marijuana.    Continue nicotine patch   hypomagnesemia  Latest magnesium of 1.8.  Aim for >2.  Continue magnesium supplements.  Anxiety.  Add as needed Ativan.  Hypophosphatemia.  On potassium phosphate.  Improved phosphorus level to 3.3.  Continue for 1 more day.   VTE Prophylaxis: SCD  Code Status: Full code  Family Communication:  None.  Disposition Plan:  . Patient is from home . Likely disposition to home in 2 to 3 days . Barriers to discharge: Plan for cardiac cath, mild encephalopathy   Consultants:  IR  GI  Cardiology  Procedures:  01/07/2020.  Diagnostic paracentesis  01/08/2020.  Therapeutic paracentesis.  01/08/2020.  Myocardial perfusion scan.  Antibiotics:  . Rocephin iv 01/07/20>  Anti-infectives (From admission, onward)   Start     Dose/Rate Route Frequency Ordered Stop   01/07/20 2200  cefTRIAXone (ROCEPHIN) 2 g in sodium chloride 0.9 % 100 mL IVPB     2 g 200 mL/hr over 30 Minutes Intravenous Every 24 hours 01/07/20 1613     01/07/20 0330  cefTRIAXone (ROCEPHIN) 2 g in sodium chloride 0.9 % 100 mL IVPB     2 g 200 mL/hr over 30 Minutes Intravenous  Once 01/07/20 0325 01/07/20 0509      Subjective: Today, patient was seen and examined at bedside.  Denies any nausea vomiting or abdominal pain.  Denies any chest pain.  Objective: Vitals:   01/11/20 2031 01/12/20 0530  BP: 119/80 113/73  Pulse: 80 86  Resp: 16 16  Temp: 99.2 F (37.3 C) 98.7 F (37.1 C)  SpO2: 99% 99%    Intake/Output Summary (Last 24 hours) at 01/12/2020 1320 Last data filed at 01/12/2020 1100 Gross per 24 hour  Intake 490 ml  Output --  Net 490 ml   Filed Weights   01/10/20 0603 01/11/20 0457 01/12/20 0530  Weight: 55.4 kg 53.3 kg 55.2 kg   Body mass index is 16.5 kg/m.   Physical Exam:  General: Thinly built, alert awake communicative.  He is alert to place and person.  Not agitated. HENT: Normocephalic, pupils equally reacting to light and accommodation.  Icteric. Chest:  Clear breath sounds.  Diminished breath sounds bilaterally. No crackles or wheezes.  CVS: S1 &S2 heard. No murmur.  Regular rate and rhythm. Abdomen: Soft, minimally distended abdomen,  Bowel sounds are heard.   Extremities: No cyanosis, clubbing with trace edema.  Peripheral pulses are palpable. Psych: Alert, awake and alert to  place and person. CNS:  No cranial nerve deficits.  Power equal in all extremities.  Alert awake and communicative. Skin: Warm and dry.  No rashes noted.   Data Review: I have personally reviewed the following laboratory data and studies,  CBC: Recent Labs  Lab 01/08/20 0600 01/09/20 0603 01/10/20 0555 01/11/20 0640 01/12/20 0705  WBC 23.0* 22.8* 26.7* 24.3* 19.2*  NEUTROABS 19.0* 18.0* 23.8* 20.7* 16.4*  HGB 9.7* 9.3* 11.5* 11.1* 10.0*  HCT 28.0* 27.1* 34.9* 33.7* 29.3*  MCV 90.6 92.5 93.3 93.4 90.7  PLT 254 225 288 259 [id number]   Basic Metabolic Panel: Recent Labs  Lab 01/08/20 0600 01/09/20 0603 01/10/20 0555 01/11/20 0640 01/12/20 0705  NA 134* 131* 133* 132* 132*  K 3.1* 3.7 4.8 4.1 3.5  CL 97* 97* 94* 98 99  CO2 27 27 24 22 25   GLUCOSE 95 92 128* 104* 81  BUN 7 6 9 9 9   CREATININE 0.57* 0.54* 0.70 0.58* 0.52*  CALCIUM 7.8* 7.8* 8.6* 8.8* 8.5*  MG 1.5* 1.7 1.7 1.6* 1.8  PHOS 1.7*  1.6* 2.6 1.8* 3.3   Liver Function Tests: Recent Labs  Lab 01/08/20 0600 01/09/20 0603 01/10/20 0555 01/11/20 0640 01/12/20 0705  AST 62* 56* 57* 56* 118*  ALT 22 22 31 28  46*  ALKPHOS 98 89 120 135* 115  BILITOT 8.1* 7.1* 7.5* 6.0* 6.2*  PROT 5.5* 5.1* 6.5 6.7 5.8*  ALBUMIN 1.8* 1.8* 2.1* 2.7* 2.2*   Recent Labs  Lab 01/07/20 0129  LIPASE 19   Recent Labs  Lab 01/07/20 0129  AMMONIA 69*   Cardiac Enzymes: No results for input(s): CKTOTAL, CKMB, CKMBINDEX, TROPONINI in the last 168 hours. BNP (last 3 results) Recent Labs    01/07/20 1345  BNP 211.4*    ProBNP (last 3 results) No results for input(s): PROBNP in the last 8760 hours.  CBG: No results for input(s): GLUCAP in the last 168 hours. Recent Results (from the past 240 hour(s))  SARS CORONAVIRUS 2 (TAT 6-24 HRS) Nasopharyngeal Nasopharyngeal Swab     Status: None   Collection Time: 01/07/20  2:25 AM   Specimen: Nasopharyngeal Swab  Result Value Ref Range Status   SARS Coronavirus 2 NEGATIVE NEGATIVE  Final    Comment: (NOTE) SARS-CoV-2 target nucleic acids are NOT DETECTED. The SARS-CoV-2 RNA is generally detectable in upper and lower respiratory specimens during the acute phase of infection. Negative results do not preclude SARS-CoV-2 infection, do not rule out co-infections with other pathogens, and should not be used as the sole basis for treatment or other patient management decisions. Negative results must be combined with clinical observations, patient history, and epidemiological information. The expected result is Negative. Fact Sheet for Patients: SugarRoll.be Fact Sheet for Healthcare Providers: https://www.woods-mathews.com/ This test is not yet approved or cleared by the [place] FDA and  has been authorized for detection and/or diagnosis of SARS-CoV-2 by FDA under an Emergency Use Authorization (EUA). This EUA will remain  in effect (meaning this test can be used) for the duration of the COVID-19 declaration under Section 56 4(b)(1) of the Act, 21 U.S.C. section 360bbb-3(b)(1), unless the authorization is terminated or revoked sooner. Performed at [facility], [place] [address], [place], [place] [address]   Body fluid culture     Status: None   Collection Time: 01/07/20  5:53 AM   Specimen: Peritoneal Cavity; Peritoneal Fluid  Result Value Ref Range Status   Specimen Description   Final    PERITONEAL CAVITY Performed at [place] [address] [place], [place] [address]    Special Requests   Final    NONE Performed at [facility], [place] [address], [place], [place] [address]    Gram Stain   Final    WBC PRESENT,BOTH PMN AND MONONUCLEAR NO ORGANISMS SEEN Gram Stain Report Called to,Read Back By and Verified With: Mee Hives @ [phone] 01/07/2020 Performed at [facility], [place] [address], [place], [place] [address]    Culture   Final    NO GROWTH 3  DAYS Performed at [facility], [place] [address], [place], [place] [address]    Report Status 01/10/2020 FINAL  Final  Culture, blood (routine x 2)     Status: None   Collection Time: 01/07/20  9:44 AM   Specimen: BLOOD RIGHT HAND  Result Value Ref Range Status   Specimen Description   Final    BLOOD RIGHT HAND Performed at [place] [address], [place], [place] [address]    Special  Requests   Final    BOTTLES DRAWN AEROBIC ONLY Blood Culture results may not be optimal due to an inadequate volume of blood received in culture bottles Performed at [place] [address] [place], [place] [address]    Culture   Final    NO GROWTH 5 DAYS Performed at [facility], [place] [address], [place], [place] [address]    Report Status 01/12/2020 FINAL  Final  Culture, blood (routine x 2)     Status: None   Collection Time: 01/07/20  9:46 AM   Specimen: BLOOD  Result Value Ref Range Status   Specimen Description   Final    BLOOD LEFT ARM Performed at [place] [address] [place], [place] [address]    Special Requests   Final    BOTTLES DRAWN AEROBIC ONLY BACTEROIDES CACCAE Performed at [facility], [place] [address] [place], [place] [address]    Culture   Final    NO GROWTH 5 DAYS Performed at [facility], [place] [address] [place], [place] [address]    Report Status 01/12/2020 FINAL  Final     Studies: US Paracentesis  Result Date: 01/10/2020 INDICATION: Patient with history of alcoholic hepatitis, CHF, recurrent ascites. Request made for therapeutic paracentesis. EXAM: ULTRASOUND GUIDED THERAPEUTIC PARACENTESIS MEDICATIONS: None COMPLICATIONS: None immediate. PROCEDURE: Informed written consent was obtained from the patient after a discussion of the risks, benefits and alternatives to treatment. A timeout was performed prior to the initiation of the procedure. Initial ultrasound  scanning demonstrates a moderate amount of ascites within the right mid to lower abdominal quadrant. The right mid to lower abdomen was prepped and draped in the usual sterile fashion. 1% lidocaine was used for local anesthesia. Following this, a 19 gauge, 7-cm, Yueh catheter was introduced. An ultrasound image was saved for documentation purposes. The paracentesis was performed. The catheter was removed and a dressing was applied. The patient tolerated the procedure well without immediate post procedural complication. Patient received post-procedure intravenous albumin; see nursing notes for details. FINDINGS: A total of approximately 2.9 liters of yellow fluid was removed. IMPRESSION: Successful ultrasound-guided therapeutic paracentesis yielding 2.9 liters of peritoneal fluid. Read by: Rowe Robert, PA-C Electronically Signed   By: Jacqulynn Cadet M.D.   On: 01/10/2020 15:54      Flora Lipps, MD  [facility] 01/12/2020

## 2020-01-12 NOTE — Progress Notes (Addendum)
Progress Note  Patient Name: Richard [name] Date of Encounter: 01/12/2020  Primary Cardiologist: Jenkins Rouge, MD   Subjective   No CP or SOB today  Inpatient Medications    Scheduled Meds: . folic acid  1 mg Oral Daily  . furosemide  40 mg Oral Daily  . lactulose  20 g Oral BID  . magnesium oxide  400 mg Oral BID  . multivitamin with minerals  1 tablet Oral Daily  . nicotine  21 mg Transdermal Daily  . pantoprazole  40 mg Oral BID AC  . potassium & sodium phosphates  1 packet Oral TID WC & HS  . prednisoLONE  40 mg Oral Daily  . sodium chloride flush  3 mL Intravenous Q12H  . sodium chloride flush  3 mL Intravenous Q12H  . spironolactone  100 mg Oral Daily  . thiamine  100 mg Oral Daily   Continuous Infusions: . sodium chloride    . cefTRIAXone (ROCEPHIN)  IV Stopped (01/11/20 2243)   PRN Meds: sodium chloride, LORazepam, sodium chloride flush   Vital Signs    Vitals:   01/11/20 0457 01/11/20 1323 01/11/20 2031 01/12/20 0530  BP:  112/77 119/80 113/73  Pulse:  97 80 86  Resp:  17 16 16   Temp:  99 F (37.2 C) 99.2 F (37.3 C) 98.7 F (37.1 C)  TempSrc:  Oral Oral Oral  SpO2:  98% 99% 99%  Weight: 53.3 kg   55.2 kg  Height:        Intake/Output Summary (Last 24 hours) at 01/12/2020 1002 Last data filed at 01/12/2020 0200 Gross per 24 hour  Intake 1090 ml  Output --  Net 1090 ml   Filed Weights   01/10/20 0603 01/11/20 0457 01/12/20 0530  Weight: 55.4 kg 53.3 kg 55.2 kg    Telemetry    NSR- Personally Reviewed  ECG    No new EKG to review- Personally Reviewed  Physical Exam   GEN: Well nourished, well developed in no acute distress HEENT: Normal NECK: No JVD; No carotid bruits LYMPHATICS: No lymphadenopathy CARDIAC:RRR, no murmurs, rubs, gallops RESPIRATORY:  Clear to auscultation without rales, wheezing or rhonchi  ABDOMEN: Soft, non-tender, non-distended MUSCULOSKELETAL:  No edema; No deformity  SKIN: Warm and dry NEUROLOGIC:   Alert and oriented x 3 PSYCHIATRIC:  Normal affect    Labs    Chemistry Recent Labs  Lab 01/10/20 0555 01/11/20 0640 01/12/20 0705  NA 133* 132* 132*  K 4.8 4.1 3.5  CL 94* 98 99  CO2 24 22 25   GLUCOSE 128* 104* 81  BUN 9 9 9   CREATININE 0.70 0.58* 0.52*  CALCIUM 8.6* 8.8* 8.5*  PROT 6.5 6.7 5.8*  ALBUMIN 2.1* 2.7* 2.2*  AST 57* 56* 118*  ALT 31 28 46*  ALKPHOS 120 135* 115  BILITOT 7.5* 6.0* 6.2*  GFRNONAA >60 >60 >60  GFRAA >60 >60 >60  ANIONGAP 15 12 8      Hematology Recent Labs  Lab 01/10/20 0555 01/11/20 0640 01/12/20 0705  WBC 26.7* 24.3* 19.2*  RBC 3.74* 3.61* 3.23*  HGB 11.5* 11.1* 10.0*  HCT 34.9* 33.7* 29.3*  MCV 93.3 93.4 90.7  MCH 30.7 30.7 31.0  MCHC 33.0 32.9 34.1  RDW 19.8* 20.0* 19.3*  PLT 288 259 216    Cardiac EnzymesNo results for input(s): TROPONINI in the last 168 hours. No results for input(s): TROPIPOC in the last 168 hours.   BNP Recent Labs  Lab 01/07/20 1345  BNP  211.4*     DDimer No results for input(s): DDIMER in the last 168 hours.   Radiology    US Paracentesis  Result Date: 01/10/2020 INDICATION: Patient with history of alcoholic hepatitis, CHF, recurrent ascites. Request made for therapeutic paracentesis. EXAM: ULTRASOUND GUIDED THERAPEUTIC PARACENTESIS MEDICATIONS: None COMPLICATIONS: None immediate. PROCEDURE: Informed written consent was obtained from the patient after a discussion of the risks, benefits and alternatives to treatment. A timeout was performed prior to the initiation of the procedure. Initial ultrasound scanning demonstrates a moderate amount of ascites within the right mid to lower abdominal quadrant. The right mid to lower abdomen was prepped and draped in the usual sterile fashion. 1% lidocaine was used for local anesthesia. Following this, a 19 gauge, 7-cm, Yueh catheter was introduced. An ultrasound image was saved for documentation purposes. The paracentesis was performed. The catheter was removed  and a dressing was applied. The patient tolerated the procedure well without immediate post procedural complication. Patient received post-procedure intravenous albumin; see nursing notes for details. FINDINGS: A total of approximately 2.9 liters of yellow fluid was removed. IMPRESSION: Successful ultrasound-guided therapeutic paracentesis yielding 2.9 liters of peritoneal fluid. Read by: [place]e Robert, PA-C Electronically Signed   By: Jacqulynn Cadet M.D.   On: 01/10/2020 15:54    Cardiac Studies   Echo 11/2018  1. The left ventricle has moderate-severely reduced systolic function of  99991111. The cavity size was normal. There is no increased left ventricular  wall thickness. Echo evidence of impaired diastolic relaxation Left  ventrical global hypokinesis without  regional wall motion abnormalities.  2. The right ventricle has mildly reduced systolic function. The cavity  was mildly enlarged. There is no increase in right ventricular wall  thickness.  3. The mitral valve is normal in structure. No evidence of mitral valve  stenosis. No significant mitral regurgitation.  4. The tricuspid valve is normal in structure.  5. The aortic valve is tricuspid. No aortic stenosis.  6. The aortic root and ascending aorta are normal in size and structure.  7. The inferior vena cava is normal in size with greater than 50%  respiratory variability.  8. No complete TR doppler jet so unable to estimate PA systolic pressure.  Echocardiogram 01/07/20: 1. Left ventricular ejection fraction, by estimation, is 60 to 65%. The  left ventricle has normal function. The left ventricle has no regional  wall motion abnormalities. Left ventricular diastolic parameters were  normal.  2. Right ventricular systolic function is normal. The right ventricular  size is normal.  3. The mitral valve is normal in structure. No evidence of mitral valve  regurgitation. No evidence of mitral stenosis.  4. The aortic  valve is normal in structure. Aortic valve regurgitation is  not visualized. No aortic stenosis is present.  5. The inferior vena cava is normal in size with greater than 50%  respiratory variability, suggesting right atrial pressure of 3 mmHg.  NST 01/09/2020:  There was no ST segment deviation noted during stress.  Defect 1: There is a medium defect of mild severity present in the basal inferolateral, mid inferolateral and apical lateral location.  This is an intermediate risk study.  The left ventricular ejection fraction is mildly decreased (45-54%).  Nuclear stress EF: 51%.   Intermediate risk study due to reversible inferolateral ischemia (moderate extent, mild severity) and mildly depressed left ventricular systolic function.  Patient Profile     54 y.o. male with a hx of alcohol abuse and withdrawal, colon cancer status  post surgery 2007 without follow-up, PUD, GERD, alcoholic pancreatitis, chronic abdominal pain, hypertension, SVT, chronic systolic heart failure (EF 30-35% in 11/2018)who is being seen today for the evaluation of heart failure and prolonged Qt  Assessment & Plan    1. Chronic diastolic CHF: EF 99991111 on echo 11/2018 with G1DD, no RWMA, and mildly reduced RV systolic function; improved with EF 60-65% on echo this admission. CM felt to be 2/2 alcohol abuse with possible tachycardia mediated component as well as uncontrolled HTN. Hypoalbuminemia has contributed to ascites. BNP was 211 this admission. Hypotension has limited management. He had left-sided chest pain complaints  -NST 01/09/20 revealed a reversible inferolateral defect. He is here with alcoholic hepatitis c/b coagulopathy with INR improved to 1.9 today after Vit K - weights are all over the place and unsure they are accurate -continue lasix 40mg  daily and spiro 100mg  daily -replete K+ to keep K>4 and Mag>1.8  2. Alcoholic hepatitis: patient presented with abdominal pain/distention and LEE. He has had  hyperbilirubinemia, leukocytosis, coagulopathy, and ascites but no SBP and recent imaging negative for cirrhosis or varices. He was started on prednisolone for management with plans for a 28 day course. He underwent a paracentesis 01/08/20 with 3.1L of fluid removed. Again with abdominal distention today - Continue management per GI and primary team - Continue to encourage ETOH abstinence   3. Prolonged QT interval: QTc 518 in the ED. Likely 2/2 metabolic derangement (K 2.4 on admission) - QTc on 4/6 inaccurate as T wave is not well demarcated making accurate assessment difficult - Avoid QT prolonging medication  4. HTN: more recently hypotension this admission. Limits management of #1 - BP controlled at 113/47mmHg today  5.  Hypercoagulable state -he is autoanticoagulated due to liver disease -INR down to 1.9 yesterday>> need to get INR<1.6 over weekend to proceed with cath on Monday -per TRH -repeat INR in am   For questions or updates, please contact [place] Please consult [email or web address] for contact info under Cardiology/STEMI.      Signed, Fransico Him, MD  01/12/2020, 10:02 AM   [phone]

## 2020-01-13 LAB — CBC WITH DIFFERENTIAL/PLATELET
Abs Immature Granulocytes: 0.13 10*3/uL — ABNORMAL HIGH (ref 0.00–0.07)
Basophils Absolute: 0 10*3/uL (ref 0.0–0.1)
Basophils Relative: 0 %
Eosinophils Absolute: 0.1 10*3/uL (ref 0.0–0.5)
Eosinophils Relative: 0 %
HCT: 29.3 % — ABNORMAL LOW (ref 39.0–52.0)
Hemoglobin: 9.9 g/dL — ABNORMAL LOW (ref 13.0–17.0)
Immature Granulocytes: 1 %
Lymphocytes Relative: 8 %
Lymphs Abs: 1.9 10*3/uL (ref 0.7–4.0)
MCH: 30.7 pg (ref 26.0–34.0)
MCHC: 33.8 g/dL (ref 30.0–36.0)
MCV: 90.7 fL (ref 80.0–100.0)
Monocytes Absolute: 1.5 10*3/uL — ABNORMAL HIGH (ref 0.1–1.0)
Monocytes Relative: 6 %
Neutro Abs: 21.3 10*3/uL — ABNORMAL HIGH (ref 1.7–7.7)
Neutrophils Relative %: 85 %
Platelets: 238 10*3/uL (ref 150–400)
RBC: 3.23 MIL/uL — ABNORMAL LOW (ref 4.22–5.81)
RDW: 19.5 % — ABNORMAL HIGH (ref 11.5–15.5)
WBC: 24.9 10*3/uL — ABNORMAL HIGH (ref 4.0–10.5)
nRBC: 0 % (ref 0.0–0.2)

## 2020-01-13 LAB — BASIC METABOLIC PANEL
Anion gap: 10 (ref 5–15)
BUN: 12 mg/dL (ref 6–20)
CO2: 26 mmol/L (ref 22–32)
Calcium: 8.5 mg/dL — ABNORMAL LOW (ref 8.9–10.3)
Chloride: 97 mmol/L — ABNORMAL LOW (ref 98–111)
Creatinine, Ser: 0.66 mg/dL (ref 0.61–1.24)
GFR calc Af Amer: >60 mL/min (ref >60)
GFR calc non Af Amer: >60 mL/min (ref >60)
Glucose, Bld: 106 mg/dL — ABNORMAL HIGH (ref 70–99)
Potassium: 3.7 mmol/L (ref 3.5–5.1)
Sodium: 133 mmol/L — ABNORMAL LOW (ref 135–145)

## 2020-01-13 LAB — HEPATIC FUNCTION PANEL
ALT: 58 U/L — ABNORMAL HIGH (ref 0–44)
AST: 92 U/L — ABNORMAL HIGH (ref 15–41)
Albumin: 2.2 g/dL — ABNORMAL LOW (ref 3.5–5.0)
Alkaline Phosphatase: 123 U/L (ref 38–126)
Bilirubin, Direct: 3.9 mg/dL — ABNORMAL HIGH (ref 0.0–0.2)
Indirect Bilirubin: 2.6 mg/dL — ABNORMAL HIGH (ref 0.3–0.9)
Total Bilirubin: 6.5 mg/dL — ABNORMAL HIGH (ref 0.3–1.2)
Total Protein: 6 g/dL — ABNORMAL LOW (ref 6.5–8.1)

## 2020-01-13 LAB — MAGNESIUM: Magnesium: 1.7 mg/dL (ref 1.7–2.4)

## 2020-01-13 LAB — PROTIME-INR
INR: 2.1 — ABNORMAL HIGH (ref 0.8–1.2)
Prothrombin Time: 23.6 seconds — ABNORMAL HIGH (ref 11.4–15.2)

## 2020-01-13 LAB — HEMOGLOBIN A1C
Hgb A1c MFr Bld: <4.2 % — ABNORMAL LOW (ref 4.8–5.6)
Mean Plasma Glucose: <74 mg/dL

## 2020-01-13 LAB — PHOSPHORUS: Phosphorus: 3.2 mg/dL (ref 2.5–4.6)

## 2020-01-13 LAB — AMMONIA: Ammonia: 73 umol/L — ABNORMAL HIGH (ref 9–35)

## 2020-01-13 MED ORDER — METOPROLOL SUCCINATE ER 25 MG PO TB24
25.0000 mg | ORAL_TABLET | Freq: Every day | ORAL | Status: DC
  Administered 2020-01-13 – 2020-01-16 (×4): 25 mg via ORAL
  Filled 2020-01-13 (×4): qty 1

## 2020-01-13 MED ORDER — SODIUM CHLORIDE 0.9% FLUSH
3.0000 mL | INTRAVENOUS | Status: DC | PRN
  Administered 2020-01-13: 3 mL via INTRAVENOUS

## 2020-01-13 MED ORDER — ASPIRIN EC 81 MG PO TBEC
81.0000 mg | DELAYED_RELEASE_TABLET | Freq: Every day | ORAL | Status: DC
  Administered 2020-01-14 – 2020-01-16 (×3): 81 mg via ORAL
  Filled 2020-01-13 (×4): qty 1

## 2020-01-13 MED ORDER — SODIUM CHLORIDE 0.9 % IV SOLN: 250.0000 mL | INTRAVENOUS | Status: DC | PRN

## 2020-01-13 MED ORDER — SODIUM CHLORIDE 0.9 % IV SOLN: INTRAVENOUS | Status: DC

## 2020-01-13 MED ORDER — SODIUM CHLORIDE 0.9% FLUSH
3.0000 mL | Freq: Two times a day (BID) | INTRAVENOUS | Status: DC
  Administered 2020-01-13 – 2020-01-16 (×4): 3 mL via INTRAVENOUS

## 2020-01-13 MED ORDER — ASPIRIN 81 MG PO CHEW
81.0000 mg | CHEWABLE_TABLET | ORAL | Status: AC
  Administered 2020-01-13: 81 mg via ORAL
  Filled 2020-01-13: qty 1

## 2020-01-13 NOTE — Progress Notes (Signed)
Progress Note  Patient Name: Richard [name] Date of Encounter: 01/13/2020  Primary Cardiologist: Jenkins Rouge, MD   Subjective   No CP or SOB. Patient has some abdominal pain this morning.   Inpatient Medications    Scheduled Meds: . folic acid  1 mg Oral Daily  . furosemide  40 mg Oral Daily  . lactulose  20 g Oral BID  . magnesium oxide  400 mg Oral BID  . multivitamin with minerals  1 tablet Oral Daily  . nicotine  21 mg Transdermal Daily  . pantoprazole  40 mg Oral BID AC  . potassium & sodium phosphates  1 packet Oral TID WC & HS  . prednisoLONE  40 mg Oral Daily  . sodium chloride flush  3 mL Intravenous Q12H  . sodium chloride flush  3 mL Intravenous Q12H  . sodium chloride flush  3 mL Intravenous Q12H  . spironolactone  100 mg Oral Daily  . thiamine  100 mg Oral Daily   Continuous Infusions: . sodium chloride    . sodium chloride    . sodium chloride 10 mL/hr at 01/13/20 [id number]   PRN Meds: sodium chloride, sodium chloride, LORazepam, sodium chloride flush, sodium chloride flush   Vital Signs    Vitals:   01/12/20 2219 01/13/20 0444 01/13/20 0557 01/13/20 0600  BP: 100/72 106/70 103/62   Pulse:  77 86   Resp:  18 20   Temp: 98.7 F (37.1 C) 98.2 F (36.8 C) 98 F (36.7 C)   TempSrc: Oral Oral Oral   SpO2: 98% 100% 97%   Weight:  56.1 kg  56.4 kg  Height:        Intake/Output Summary (Last 24 hours) at 01/13/2020 0808 Last data filed at 01/13/2020 0445 Gross per 24 hour  Intake 60 ml  Output 200 ml  Net -140 ml   Last 3 Weights 01/13/2020 01/13/2020 01/12/2020  Weight (lbs) 124 lb 5.4 oz 123 lb 10.9 oz 121 lb 11.1 oz  Weight (kg) 56.4 kg 56.1 kg 55.2 kg      Telemetry    NSR, HR 70-80 - Personally Reviewed  ECG    No new - Personally Reviewed  Physical Exam   GEN: No acute distress.   Neck: No JVD Cardiac: RRR, no murmurs, rubs, or gallops.  Respiratory: Clear to auscultation bilaterally. GI: Soft, nontender, non-distended  MS:  No edema; No deformity. Neuro:  Nonfocal  Psych: Normal affect   Labs    High Sensitivity Troponin:   Recent Labs  Lab 01/07/20 0946 01/07/20 1156 01/07/20 1345  TROPONINIHS 3 3 4       Chemistry Recent Labs  Lab 01/11/20 0640 01/12/20 0705 01/13/20 0552  NA 132* 132* 133*  K 4.1 3.5 3.7  CL 98 99 97*  CO2 22 25 26   GLUCOSE 104* 81 106*  BUN 9 9 12   CREATININE 0.58* 0.52* 0.66  CALCIUM 8.8* 8.5* 8.5*  PROT 6.7 5.8* 6.0*  ALBUMIN 2.7* 2.2* 2.2*  AST 56* 118* 92*  ALT 28 46* 58*  ALKPHOS 135* 115 123  BILITOT 6.0* 6.2* 6.5*  GFRNONAA >60 >60 >60  GFRAA >60 >60 >60  ANIONGAP 12 8 10      Hematology Recent Labs  Lab 01/11/20 0640 01/12/20 0705 01/13/20 0552  WBC 24.3* 19.2* 24.9*  RBC 3.61* 3.23* 3.23*  HGB 11.1* 10.0* 9.9*  HCT 33.7* 29.3* 29.3*  MCV 93.4 90.7 90.7  MCH 30.7 31.0 30.7  MCHC 32.9 34.1 33.8  RDW 20.0* 19.3* 19.5*  PLT 259 216 238    BNP Recent Labs  Lab 01/07/20 1345  BNP 211.4*     DDimer No results for input(s): DDIMER in the last 168 hours.   Radiology    No results found.  Cardiac Studies   Echo 11/2018  1. The left ventricle has moderate-severely reduced systolic function of  99991111. The cavity size was normal. There is no increased left ventricular  wall thickness. Echo evidence of impaired diastolic relaxation Left  ventrical global hypokinesis without  regional wall motion abnormalities.  2. The right ventricle has mildly reduced systolic function. The cavity  was mildly enlarged. There is no increase in right ventricular wall  thickness.  3. The mitral valve is normal in structure. No evidence of mitral valve  stenosis. No significant mitral regurgitation.  4. The tricuspid valve is normal in structure.  5. The aortic valve is tricuspid. No aortic stenosis.  6. The aortic root and ascending aorta are normal in size and structure.  7. The inferior vena cava is normal in size with greater than 50%   respiratory variability.  8. No complete TR doppler jet so unable to estimate PA systolic pressure.  Echocardiogram 01/07/20: 1. Left ventricular ejection fraction, by estimation, is 60 to 65%. The  left ventricle has normal function. The left ventricle has no regional  wall motion abnormalities. Left ventricular diastolic parameters were  normal.  2. Right ventricular systolic function is normal. The right ventricular  size is normal.  3. The mitral valve is normal in structure. No evidence of mitral valve  regurgitation. No evidence of mitral stenosis.  4. The aortic valve is normal in structure. Aortic valve regurgitation is  not visualized. No aortic stenosis is present.  5. The inferior vena cava is normal in size with greater than 50%  respiratory variability, suggesting right atrial pressure of 3 mmHg.  NST 01/09/2020:  There was no ST segment deviation noted during stress.  Defect 1: There is a medium defect of mild severity present in the basal inferolateral, mid inferolateral and apical lateral location.  This is an intermediate risk study.  The left ventricular ejection fraction is mildly decreased (45-54%).  Nuclear stress EF: 51%.  Intermediate risk study due to reversible inferolateral ischemia (moderate extent, mild severity) and mildly depressed left ventricular systolic function.  Patient Profile     54 y.o. male with a hx of alcohol abuse and withdrawal, colon cancer status post surgery 2007 without follow-up, PUD, GERD, alcoholic pancreatitis, chronic abdominal pain, hypertension, SVT, chronic systolic heart failure (EF 30-35% in 11/2018)who is being seen today for the evaluation of heart failure and prolonged Qt  Assessment & Plan    Chronic diastolic CHF - EF 99991111 on echo 11/2018, G1DD, no RWMA, mildly reduced RV function - Echo with improved EF 60-65% this admission - CM felt to be 2/2 to alcohol use and possibly tachymediated with uncontrolled  HTN - On admission BNP 211. Hypotension has limited management - He had left sided chest pain and Lexiscan stress test showed reversible inferolateral defect - albumin low on admission. Suspected swelling is worse given low albumin - continue Lasix 40 mg daily and spiro mg daily - weights are not accurate. I/Os show net gain since admission  Alcoholic hepatitis - patient presented with abdominal pain/distention found to have leukocytosis, hyperbilirubinemia, coagulopathy and ascitics - He is s/p paracentesis 4/7 with 3.1L fluid removed - management per GI/IM  Abnormal stress test -  Lexiscan with reversible inferolateral defect - Plan was for cardiac cath ?today however INR is 2.1 with goal <1.6 - Will discuss plan with MD  Prolonged QT  - QTc 518 in the ED - thought to be 2/2 to metabolic derangement  HTN - hypotension this admission - BP today 103/62  Hypercoagulable state - INR<1.6 for cath today. - INR today is 2.1  For questions or updates, please contact [place] Please consult [email or web address] for contact info under        Signed, Rockland Kotarski Ninfa Meeker, PA-C  01/13/2020, 8:08 AM

## 2020-01-13 NOTE — Progress Notes (Addendum)
[facility] Progress Note  CC:  Alcoholic hepatitis   Subjective: He had some generalized abdominal pain this morning. No N/V. He wishes to eat if  cardiac catheterization not done today.    Objective:  Vital signs in last 24 hours: Temp:  [98 F (36.7 C)-98.7 F (37.1 C)] 98 F (36.7 C) (04/12 0557) Pulse Rate:  [77-99] 86 (04/12 0557) Resp:  [18-20] 20 (04/12 0557) BP: (97-106)/(62-72) 103/62 (04/12 0557) SpO2:  [97 %-100 %] 97 % (04/12 0557) Weight:  [56.1 kg-56.4 kg] 56.4 kg (04/12 0600) Last BM Date: 01/12/20 General:   Alert and oriented, soft spoken in NAD.  Eyes: Scleral icterus present.  Heart: RRR, no murmur.  Pulm:  Lungs clear, diminished in the bases bilaterally.  Abdomen: Distended but not tense. + ascites. Nontender. + BS x 4 quadrants.  Extremities:  Without edema. Neurologic:  Alert and  oriented x4;  grossly normal neurologically. Hands slightly tremulous without asterixis.  Psych:  Alert and cooperative. Normal mood and affect.  Intake/Output from previous day: 04/11 0701 - 04/12 0700 In: 60 [P.O.:60] Out: 200 [Urine:200] Intake/Output this shift: Total I/O In: 0  Out: 375 [Urine:375]  Lab Results: Recent Labs    01/11/20 0640 01/12/20 0705 01/13/20 0552  WBC 24.3* 19.2* 24.9*  HGB 11.1* 10.0* 9.9*  HCT 33.7* 29.3* 29.3*  PLT 259 216 238   BMET Recent Labs    01/11/20 0640 01/12/20 0705 01/13/20 0552  NA 132* 132* 133*  K 4.1 3.5 3.7  CL 98 99 97*  CO2 '22 25 26  ' GLUCOSE 104* 81 106*  BUN '9 9 12  ' CREATININE 0.58* 0.52* 0.66  CALCIUM 8.8* 8.5* 8.5*   LFT Recent Labs    01/13/20 0552  PROT 6.0*  ALBUMIN 2.2*  AST 92*  ALT 58*  ALKPHOS 123  BILITOT 6.5*  BILIDIR 3.9*  IBILI 2.6*   PT/INR Recent Labs    01/12/20 1023 01/13/20 0552  LABPROT 23.0* 23.6*  INR 2.0* 2.1*   Hepatitis Panel No results for input(s): HEPBSAG, HCVAB, HEPAIGM, HEPBIGM in the last 72 hours.  No results found.  Assessment /  Plan:  56. 54 year old male with sever alcoholic hepatitis with ascites and peripheral edema.  No EtOH for 1 month. Cirrhosis by imaging.  S/P paracentesis 4/7 with 3.1 L peritoneal fluid removed and 4/9 with 2.9 L peritoneal fluid removed without evidence of SBP. MDF > 32 on admission therefore Prednisolone 48m daily started on 4/8. Plan to check Lille score on 4/ 14 to verify if he is responding or not. Today Alk phos 123. AST 92. ALT 58. Til bili 6.5. Hemodynamically stable.  -Continue Prednisolone 436mdaily  -Check Lille score on 4/14 -If cardiac catheterization not done today patient to resume Low salt, heart healthy diet.  -Repeat Hepatic panel, BMP, PT/INR in am  -He will most likely require paracentesis in the next few days -Continue diuretics for now -Continue Lactulose 20gm bid.  -Continue PPI po bid  2. Elevated LFTs secondary to # 1.  3. Coagulopathy secondary to # 1. Received Vitamin K 17m43mo x 1 on 4/11. Today INR 2.1  4. Normocytic anemia. Hg 9.9. HCT 29.3. MCV 90.7. No signs of active GI bleeding.  -Repeat CBC in am   5. History of CAD, s/p CABG in 2006 with abnormal nuclear myoview stress test.  EF 45 - 55%. Scheduled for a cardiac catheterization today but procedure not yet done due to INR  2.1.   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 I agree with the Advanced Practitioner's note, impression and recommendations.   54 year old male with alcoholic hepatitis and cirrhosis decompensated with ascites  MELD score 22  On prednisolone day 5, will follow up Lille score day 7.  If greater than 0.45 will be a nonresponder to steroids with poor prognosis.  But if less than 0.45, plan to continue prednisolone for 28 days  S/p LVP, no evidence of SBP Leukocytosis likely secondary to alcoholic hepatitis and steroids  Continue salt restricted diet and diuretics  Monitor CMP, PT and INR  K. Denzil Magnuson , MD [phone]

## 2020-01-13 NOTE — Progress Notes (Signed)
PROGRESS NOTE  Richard [name] [id number] DOB: 08/30/1966 DOA: 01/06/2020 PCP: Antony Blackbird, MD   LOS: 6 days   Brief narrative: As per HPI,  Richard [name] a 54 y.o.malePMHx of EtOH in early remission, drug abuse, , pancreatitis, PUD, colon cancer s/p  colectomy, EtOH Cardiomyopathy with EF 30 to 35% in February 2020, HTN presented to hospital with complaints of abdominal pain, distention and leg swelling.  Of note, patient was recently discharged from the hospital on 12/14/2019 and ever since then he has not drank alcohol.  He has however noted increasing abdominal distention and lower extremity edema, so he decided come to the hospital.  ED Course:Upon arrival to the ED, patient is found to be afebrile, saturating mid 90s on room air, and with stable blood pressure. Chemistry panel notable for potassium 2.4, albumin of 1.9, AST 70, ALT 26, and bilirubin 9.3. CBC with leukocytosis to 23,700 and stable normocytic anemia. Lactic acid was 2.6. Patient was given 2 g IV Rocephin, 20 mEq IV potassium, fentanyl, and Zofran in the ED. COVID-19 PCR was negative.  Patient was then admitted to the hospital for further evaluation and treatment.   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  No evidence of SBP.  Abdominal pain has improved.   Continue Protonix.  Blood cultures and peritoneal fluid negative so far.on prednisolone as per GI  for acute alcoholic hepatitis..    Continue fluid restriction and low-salt diet.    Patient did have a EGD colonoscopy on 3/12 which showed esophagitis at that time.  Completed 5-day course of Rocephin.  Leukocytosis likely from steroid but has trended down..  Continue on  Lasix 40 daily, spironolactone 100 mg daily, thiamine and folic acid.  We will continue to monitor electrolytes closely.  Potassium 3.7 today.  Creatinine 0.6.  Confusion, disorientation ikely metabolic encephalopathy. off fentanyl.  Improved at this time..  On Ativan as needed for agitation anxiety with history of alcohol abuse. we will continue to monitor.  Continue lactulose. Ammonia at 73  EtOH cardiomyopathy/Acute on Chronic systolic CHF/prolonged QTC -EF 30 to 35% on echocardiogram 11/08/2018. 2/6 Echocardiogram showed ejection fraction at 60 to 65%.  Abnormal myocardial perfusion scan.  Cardiology recommend cardiac catheterization when INR is better.  INR of 2.1 today despite receiving  vitamin K..  Only option is for his frozen plasma prior to cardiac catheterization.  Spoke with cardiology about it yesterday..   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  Received vitamin K daily.  INR 2.1>1.9>2.0.  Consider FFP prior to cardiac catheterization.  EtOH abuse in remission Patient has not drank alcohol since last admission.  He is motivated to abstain  Marijuana/tobacco use disorder. 4/6 urine drug screen positive marijuana.    Continue nicotine patch   hypomagnesemia Latest magnesium of 1.7.   Continue magnesium supplements.  Anxiety.   Ativan.  Hypophosphatemia.  On potassium phosphate.  Will discontinue today.  Improved phosphorus level to 3.2.  VTE Prophylaxis: SCD  Code Status: Full code  Family Communication: I tried to call the patient's mother on the phone but the voice mail was full  and I was unable to reach her.  Disposition Plan:  . Patient is from home . Likely disposition to home in 2 to 3  days.  PT recommended no skilled therapy needs on discharge . Barriers to discharge: Plan for cardiac cath, INR elevation  Consultants:  IR  GI  Cardiology  Procedures:  01/07/2020.  Diagnostic paracentesis  01/08/2020.  Therapeutic paracentesis.  01/08/2020.  Myocardial perfusion scan.  Antibiotics:  . Rocephin iv 01/07/20>  Anti-infectives (From admission, onward)   Start     Dose/Rate Route Frequency Ordered Stop   01/07/20 2200  cefTRIAXone (ROCEPHIN) 2 g in sodium chloride 0.9 % 100 mL IVPB  Status:  Discontinued     2 g 200 mL/hr over 30 Minutes Intravenous Every 24 hours 01/07/20 1613 01/12/20 1333   01/07/20 0330  cefTRIAXone (ROCEPHIN) 2 g in sodium chloride 0.9 % 100 mL IVPB     2 g 200 mL/hr over 30 Minutes Intravenous  Once 01/07/20 0325 01/07/20 0509      Subjective: Today, patient feels okay.  Denies  nausea vomiting but some diffuse abdominal pain.  Denies chest pain, shortness of breath.  Objective: Vitals:   01/13/20 0444 01/13/20 0557  BP: 106/70 103/62  Pulse: 77 86  Resp: 18 20  Temp: 98.2 F (36.8 C) 98 F (36.7 C)  SpO2: 100% 97%    Intake/Output Summary (Last 24 hours) at 01/13/2020 1234 Last data filed at 01/13/2020 1003 Gross per 24 hour  Intake 60 ml  Output 575 ml  Net -515 ml   Filed Weights   01/12/20 0530 01/13/20 0444 01/13/20 0600  Weight: 55.2 kg 56.1 kg 56.4 kg   Body mass index is 16.86 kg/m.   Physical Exam:  General: Thinly built, not in obvious distress, alert awake and communicative.  Oriented to place and person HENT: Normocephalic, pupils equally reacting to light and accommodation.  Anicteric chest:  Clear breath sounds.  Diminished breath sounds bilaterally. No crackles or wheezes.  CVS: S1 &S2 heard. No murmur.  Regular rate and rhythm. Abdomen: Soft, mild sounds are heard, mild fluid in abdomen nontender palpation.   Extremities: No cyanosis, clubbing or edema.  Peripheral pulses are palpable. Psych: Alert, awake  and communicative, normal mood CNS:  No cranial nerve deficits.  Moving all extremities  Skin: Warm and dry.  No rashes noted.   Data Review: I have personally reviewed the following laboratory data and studies,  CBC: Recent Labs  Lab 01/09/20 0603 01/10/20 0555 01/11/20 0640 01/12/20 0705 01/13/20 0552  WBC 22.8* 26.7* 24.3* 19.2* 24.9*  NEUTROABS 18.0* 23.8* 20.7* 16.4* 21.3*  HGB 9.3* 11.5* 11.1* 10.0* 9.9*  HCT 27.1* 34.9* 33.7* 29.3* 29.3*  MCV 92.5 93.3 93.4 90.7 90.7  PLT 225 288 259 216 [id number]   Basic Metabolic Panel: Recent Labs  Lab 01/09/20 0603 01/10/20 0555 01/11/20 0640 01/12/20 0705 01/13/20 0552  NA 131* 133* 132* 132* 133*  K 3.7 4.8 4.1 3.5 3.7  CL 97* 94* 98 99 97*  CO2 27 24 22 25 26   GLUCOSE 92 128* 104* 81 106*  BUN 6 9 9 9 12   CREATININE 0.54* 0.70 0.58* 0.52* 0.66  CALCIUM 7.8* 8.6* 8.8* 8.5* 8.5*  MG 1.7 1.7 1.6* 1.8 1.7  PHOS 1.6* 2.6 1.8* 3.3 3.2   Liver Function Tests: Recent  Labs  Lab 01/09/20 0603 01/10/20 0555 01/11/20 0640 01/12/20 0705 01/13/20 0552  AST 56* 57* 56* 118* 92*  ALT 22 31 28  46* 58*  ALKPHOS 89 120 135* 115 123  BILITOT 7.1* 7.5* 6.0* 6.2* 6.5*  PROT 5.1* 6.5 6.7 5.8* 6.0*  ALBUMIN 1.8* 2.1* 2.7* 2.2* 2.2*   Recent Labs  Lab 01/07/20 0129  LIPASE 19   Recent Labs  Lab 01/07/20 0129 01/13/20 0552  AMMONIA 69* 73*   Cardiac Enzymes: No results for input(s): CKTOTAL, CKMB, CKMBINDEX, TROPONINI in the last 168 hours. BNP (last 3 results) Recent Labs    01/07/20 1345  BNP 211.4*    ProBNP (last 3 results) No results for input(s): PROBNP in the last 8760 hours.  CBG: No results for input(s): GLUCAP in the last 168 hours. Recent Results (from the past 240 hour(s))  SARS CORONAVIRUS 2 (TAT 6-24 HRS) Nasopharyngeal Nasopharyngeal Swab     Status: None   Collection Time: 01/07/20  2:25 AM   Specimen: Nasopharyngeal Swab  Result Value Ref Range Status   SARS Coronavirus 2 NEGATIVE NEGATIVE Final     Comment: (NOTE) SARS-CoV-2 target nucleic acids are NOT DETECTED. The SARS-CoV-2 RNA is generally detectable in upper and lower respiratory specimens during the acute phase of infection. Negative results do not preclude SARS-CoV-2 infection, do not rule out co-infections with other pathogens, and should not be used as the sole basis for treatment or other patient management decisions. Negative results must be combined with clinical observations, patient history, and epidemiological information. The expected result is Negative. Fact Sheet for Patients: SugarRoll.be Fact Sheet for Healthcare Providers: https://www.woods-mathews.com/ This test is not yet approved or cleared by the [place] FDA and  has been authorized for detection and/or diagnosis of SARS-CoV-2 by FDA under an Emergency Use Authorization (EUA). This EUA will remain  in effect (meaning this test can be used) for the duration of the COVID-19 declaration under Section 56 4(b)(1) of the Act, 21 U.S.C. section 360bbb-3(b)(1), unless the authorization is terminated or revoked sooner. Performed at [facility], [place] [address], [place], [place] [address]   Body fluid culture     Status: None   Collection Time: 01/07/20  5:53 AM   Specimen: Peritoneal Cavity; Peritoneal Fluid  Result Value Ref Range Status   Specimen Description   Final    PERITONEAL CAVITY Performed at [facility] [address] [place], [place] [address]    Special Requests   Final    NONE Performed at [facility], [place] [address] [place], [place] [address]    Gram Stain   Final    WBC PRESENT,BOTH PMN AND MONONUCLEAR NO ORGANISMS SEEN Gram Stain Report Called to,Read Back By and Verified With: Mee Hives @ [phone] 01/07/2020 Performed at [facility], [place] [address], [place], [place] [address]    Culture   Final    NO GROWTH 3  DAYS Performed at [facility], [place] [address], [place], [place] [address]    Report Status 01/10/2020 FINAL  Final  Culture, blood (routine x 2)     Status: None   Collection Time: 01/07/20  9:44 AM   Specimen: BLOOD RIGHT HAND  Result Value Ref Range Status   Specimen Description   Final    BLOOD RIGHT HAND Performed at [facility] [address] [place], [place] [address]    Special Requests   Final  BOTTLES DRAWN AEROBIC ONLY Blood Culture results may not be optimal due to an inadequate volume of blood received in culture bottles Performed at [facility], [place] [address] [place], [place] [address]    Culture   Final    NO GROWTH 5 DAYS Performed at [facility], [place] [address], [place], [place] [address]    Report Status 01/12/2020 FINAL  Final  Culture, blood (routine x 2)     Status: None   Collection Time: 01/07/20  9:46 AM   Specimen: BLOOD  Result Value Ref Range Status   Specimen Description   Final    BLOOD LEFT ARM Performed at [place] [address] [place], [place] [address]    Special Requests   Final    BOTTLES DRAWN AEROBIC ONLY BACTEROIDES CACCAE Performed at [facility], [place] [address], [place], [place] [address]    Culture   Final    NO GROWTH 5 DAYS Performed at [facility], [place] [address], [place],  [address]    Report Status 01/12/2020 FINAL  Final     Studies: No results found.    Flora Lipps, MD  [facility] 01/13/2020

## 2020-01-14 ENCOUNTER — Encounter (HOSPITAL_COMMUNITY)
Admission: EM | Disposition: A | Discharge status: Home / Self Care | Payer: Self-pay | Source: Home / Self Care | Attending: Internal Medicine

## 2020-01-14 LAB — CBC WITH DIFFERENTIAL/PLATELET
Abs Immature Granulocytes: 0.17 10*3/uL — ABNORMAL HIGH (ref 0.00–0.07)
Basophils Absolute: 0 10*3/uL (ref 0.0–0.1)
Basophils Relative: 0 %
Eosinophils Absolute: 0 10*3/uL (ref 0.0–0.5)
Eosinophils Relative: 0 %
HCT: 26.6 % — ABNORMAL LOW (ref 39.0–52.0)
Hemoglobin: 9 g/dL — ABNORMAL LOW (ref 13.0–17.0)
Immature Granulocytes: 1 %
Lymphocytes Relative: 6 %
Lymphs Abs: 1.4 10*3/uL (ref 0.7–4.0)
MCH: 30.9 pg (ref 26.0–34.0)
MCHC: 33.8 g/dL (ref 30.0–36.0)
MCV: 91.4 fL (ref 80.0–100.0)
Monocytes Absolute: 1.4 10*3/uL — ABNORMAL HIGH (ref 0.1–1.0)
Monocytes Relative: 6 %
Neutro Abs: 20.1 10*3/uL — ABNORMAL HIGH (ref 1.7–7.7)
Neutrophils Relative %: 87 %
Platelets: 242 10*3/uL (ref 150–400)
RBC: 2.91 MIL/uL — ABNORMAL LOW (ref 4.22–5.81)
RDW: 19.3 % — ABNORMAL HIGH (ref 11.5–15.5)
WBC: 23.1 10*3/uL — ABNORMAL HIGH (ref 4.0–10.5)
nRBC: 0 % (ref 0.0–0.2)

## 2020-01-14 LAB — HEPATIC FUNCTION PANEL
ALT: 58 U/L — ABNORMAL HIGH (ref 0–44)
AST: 71 U/L — ABNORMAL HIGH (ref 15–41)
Albumin: 2.1 g/dL — ABNORMAL LOW (ref 3.5–5.0)
Alkaline Phosphatase: 119 U/L (ref 38–126)
Bilirubin, Direct: 3.8 mg/dL — ABNORMAL HIGH (ref 0.0–0.2)
Indirect Bilirubin: 2.7 mg/dL — ABNORMAL HIGH (ref 0.3–0.9)
Total Bilirubin: 6.5 mg/dL — ABNORMAL HIGH (ref 0.3–1.2)
Total Protein: 5.8 g/dL — ABNORMAL LOW (ref 6.5–8.1)

## 2020-01-14 LAB — BASIC METABOLIC PANEL
Anion gap: 10 (ref 5–15)
BUN: 13 mg/dL (ref 6–20)
CO2: 25 mmol/L (ref 22–32)
Calcium: 8.6 mg/dL — ABNORMAL LOW (ref 8.9–10.3)
Chloride: 98 mmol/L (ref 98–111)
Creatinine, Ser: 0.51 mg/dL — ABNORMAL LOW (ref 0.61–1.24)
GFR calc Af Amer: >60 mL/min (ref >60)
GFR calc non Af Amer: >60 mL/min (ref >60)
Glucose, Bld: 107 mg/dL — ABNORMAL HIGH (ref 70–99)
Potassium: 3.6 mmol/L (ref 3.5–5.1)
Sodium: 133 mmol/L — ABNORMAL LOW (ref 135–145)

## 2020-01-14 LAB — PROTIME-INR
INR: 1.6 — ABNORMAL HIGH (ref 0.8–1.2)
Prothrombin Time: 18.5 seconds — ABNORMAL HIGH (ref 11.4–15.2)

## 2020-01-14 LAB — PHOSPHORUS: Phosphorus: 2.9 mg/dL (ref 2.5–4.6)

## 2020-01-14 LAB — MAGNESIUM: Magnesium: 1.4 mg/dL — ABNORMAL LOW (ref 1.7–2.4)

## 2020-01-14 SURGERY — LEFT HEART CATH AND CORONARY ANGIOGRAPHY
Anesthesia: LOCAL

## 2020-01-14 MED ORDER — MAGNESIUM SULFATE 2 GM/50ML IV SOLN
2.0000 g | Freq: Once | INTRAVENOUS | Status: AC
  Administered 2020-01-14: 16:00:00 2 g via INTRAVENOUS
  Filled 2020-01-14: qty 50

## 2020-01-14 MED ORDER — TRAZODONE HCL 50 MG PO TABS
50.0000 mg | ORAL_TABLET | Freq: Every evening | ORAL | Status: AC | PRN
  Administered 2020-01-14 – 2020-01-15 (×2): 50 mg via ORAL
  Filled 2020-01-14 (×2): qty 1

## 2020-01-14 NOTE — Progress Notes (Signed)
[facility] Progress Note  CC:  Alcoholic hepatitis   Subjective: He denies having any N/V or abdominal pain. He passed a normal brown BM this morning. Appetite is good.    Objective:  Vital signs in last 24 hours: Temp:  [98.1 F (36.7 C)-98.2 F (36.8 C)] 98.2 F (36.8 C) (04/13 0419) Pulse Rate:  [73-77] 77 (04/13 0944) Resp:  [17-18] 18 (04/13 0419) BP: (91-112)/(57-69) 112/68 (04/13 0944) SpO2:  [97 %-100 %] 97 % (04/13 0419) Last BM Date: 01/12/20 General:  Awake 54 year old male in NAD.  Heart: RRR, no murmur. Eyes: Scleral icterus.  Pulm:  Crackles in the bases bilaterally.  Abdomen: Protuberant, distended but not tense. + Ascites. No HSM. Hypoactive bowel sounds x 4 quads.  Extremities:  Without edema. Neurologic:  Alert and  oriented x 3. Answers questions appropriately. Moves all extremities. No asterixis.  Psych:  Alert and cooperative. Normal mood and affect.  Intake/Output from previous day: 04/12 0701 - 04/13 0700 In: 1389.4 [P.O.:592; I.V.:797.4] Out: 1026 [Urine:1025; Stool:1] Intake/Output this shift: Total I/O In: 360 [P.O.:360] Out: 100 [Urine:100]  Lab Results: Recent Labs    01/12/20 0705 01/13/20 0552 01/14/20 0553  WBC 19.2* 24.9* 23.1*  HGB 10.0* 9.9* 9.0*  HCT 29.3* 29.3* 26.6*  PLT 216 238 242   BMET Recent Labs    01/12/20 0705 01/13/20 0552 01/14/20 0553  NA 132* 133* 133*  K 3.5 3.7 3.6  CL 99 97* 98  CO2 '25 26 25  ' GLUCOSE 81 106* 107*  BUN '9 12 13  ' CREATININE 0.52* 0.66 0.51*  CALCIUM 8.5* 8.5* 8.6*   LFT Recent Labs    01/14/20 0553  PROT 5.8*  ALBUMIN 2.1*  AST 71*  ALT 58*  ALKPHOS 119  BILITOT 6.5*  BILIDIR 3.8*  IBILI 2.7*   PT/INR Recent Labs    01/13/20 0552 01/14/20 0553  LABPROT 23.6* 18.5*  INR 2.1* 1.6*   Hepatitis Panel No results for input(s): HEPBSAG, HCVAB, HEPAIGM, HEPBIGM in the last 72 hours.  No results found.  Assessment / Plan:  20. 54 year old male with  sever alcoholic hepatitis with ascites and peripheral edema.  No EtOH for 1 month. Cirrhosis by imaging. S/P paracentesis 4/7  (3.1 L peritoneal fluid removed) and 4/9  (2.9 L peritoneal fluid removed) without evidence of SBP. MDF > 32 on admission therefore Prednisolone 19m daily started on 4/8. Plan to check Lille score on 4/ 14 to verify if he is responding or not. LFTs drifting downward. Today AST 71. ALT 58. Alk phos 119. T. Bili 2.6 -> 2.7. INR 2.1. No signs of hepatic encephalopathy on exam.  Hemodynamically stable.  -Continue Prednisolone 414mdaily  -Check Lille score on 4/14 -Continue diuretics for now -Continue Lactulose 20gm bid.  -Continue PPI po bid -Most likely will need paracentesis in the next few days -Recommend getting patient OOB to chair QD as tolerated  2. Elevated LFTs secondary to # 1.  3. Coagulopathy secondary to # 1. Received Vitamin K 55m49mo x 1 on 4/11. Today INR 1.6.k   4. Normocytic anemia. Hg 9.9 -> 9.0.  No signs of active GI bleeding.   5. Leukocytosis, most likely due to Prednisolone. WBC 23.1 down from 24.9.  He is afebrile.   6. Hyponatremia, secondary to liver disease and diuretics. Today Na+ 133. Cr. 0.66. -Reduce diuretics if NA+ decreases -Repeat BMP in am  7. History of CAD, s/p CABG in 2006 with  abnormal nuclear myoview stress test.  EF 45 - 55%. Cardiology canceled plans for cardiac catheterization, to follow up with cardiology as outpatient.    Further recommendations per Dr. 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

## 2020-01-14 NOTE — Progress Notes (Addendum)
PROGRESS NOTE  Richard [name] [id number] DOB: [birth year] DOA: 01/06/2020 PCP: Antony Blackbird, MD   LOS: 7 days   Brief narrative: As per HPI,  Richard [name] a 54 y.o.malePMHx of EtOH in early remission, drug abuse, , pancreatitis, PUD, colon cancer s/p  colectomy, EtOH Cardiomyopathy with EF 30 to 35% in February 2020, HTN presented to hospital with complaints of abdominal pain, distention and leg swelling.  Of note, patient was recently discharged from the hospital on 12/14/2019 and ever since then he has not drank alcohol.  He has however noted increasing abdominal distention and lower extremity edema, so he decided come to the hospital.  ED Course:Upon arrival to the ED, patient is found to be afebrile, saturating mid 90s on room air, and with stable blood pressure. Chemistry panel notable for potassium 2.4, albumin of 1.9, AST 70, ALT 26, and bilirubin 9.3. CBC with leukocytosis to 23,700 and stable normocytic anemia. Lactic acid was 2.6. Patient was given 2 g IV Rocephin, 20 mEq IV potassium, fentanyl, and Zofran in the ED. COVID-19 PCR was negative.  Patient was then admitted to the hospital for further evaluation and treatment.  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  No evidence of SBP.  Abdominal pain has improved.   Continue Protonix.  Blood cultures and peritoneal fluid negative so far. On prednisolone as per GI  for acute alcoholic hepatitis..    Continue fluid restriction and low-salt diet.    Patient did have a EGD colonoscopy on 3/12 which showed esophagitis at that time.  Completed 5-day course of Rocephin.  Leukocytosis likely from steroid but has trended down..  Continue on  Lasix 40 daily, spironolactone 100 mg daily, thiamine and folic acid.  We will continue to monitor electrolytes closely.  Potassium 3.7 today.  Creatinine 0.6.  Confusion, disorientation ikely metabolic encephalopathy. off fentanyl.  Improved at this time..  On Ativan as needed for agitation anxiety with history of alcohol abuse. we will continue to monitor.  Continue lactulose. Ammonia at 73.  Stable and improved.  EtOH cardiomyopathy/Acute on Chronic systolic CHF/prolonged QTC -EF 30 to 35% on echocardiogram 11/08/2018. 2/6 Echocardiogram showed ejection fraction at 60 to 65%.  Abnormal myocardial perfusion scan.  Cardiology now do not recommend cardiac catheterization recommended medical management..  INR of 1.6 today.   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  Received vitamin K   INR 2.1>1.9>2.0>1.6.   EtOH abuse in remission Patient has not drank alcohol since last admission.  He is motivated to abstain  Marijuana/tobacco use disorder. 4/6 urine drug screen positive marijuana.    Continue nicotine patch   hypomagnesemia Latest magnesium of 1.4.   Continue magnesium supplements.  Add IV magnesium sulfate today  Anxiety.   Ativan.  Better  Hypophosphatemia.  Replenished, latest phosphorus of 2.9.   VTE Prophylaxis: SCD  Code Status: Full code  Family Communication:  I spoke with the patient's mother and updated her about  the clinical condition of the patient and the plan for disposition home likely in am.  Disposition Plan:  . Patient is from home . Likely disposition to home by tomorrow.  PT recommended no skilled therapy needs on discharge . Barriers to discharge: Check LFTs in a.m, GI recommendations on  discharge, out of bed to chair  Consultants:  IR  GI  Cardiology  Procedures:  01/07/2020.  Diagnostic paracentesis  01/08/2020.  Therapeutic paracentesis.  01/08/2020.  Myocardial perfusion scan.  Antibiotics:  . Rocephin iv 01/07/20>  Anti-infectives (From admission, onward)   Start     Dose/Rate Route Frequency Ordered Stop   01/07/20 2200  cefTRIAXone (ROCEPHIN) 2 g in sodium chloride 0.9 % 100 mL IVPB  Status:  Discontinued     2 g 200 mL/hr over 30 Minutes Intravenous Every 24 hours 01/07/20 1613 01/12/20 1333   01/07/20 0330  cefTRIAXone (ROCEPHIN) 2 g in sodium chloride 0.9 % 100 mL IVPB     2 g 200 mL/hr over 30 Minutes Intravenous  Once 01/07/20 0325 01/07/20 0509     Subjective: Today, patient was seen and examined at bedside.  Denies any nausea, vomiting, had bowel movement.  Some diffuse abdominal pain.  Objective: Vitals:   01/14/20 0423 01/14/20 0944  BP: 109/65 112/68  Pulse: 74 77  Resp:    Temp:    SpO2:      Intake/Output Summary (Last 24 hours) at 01/14/2020 1328 Last data filed at 01/14/2020 1010 Gross per 24 hour  Intake 1749.4 ml  Output 751 ml  Net 998.4 ml   Filed Weights   01/12/20 0530 01/13/20 0444 01/13/20 0600  Weight: 55.2 kg 56.1 kg 56.4 kg   Body mass index is 16.86 kg/m.   Physical Exam: General: Thinly built, not in obvious distress, alert awake and communicative.  Oriented  HENT: Normocephalic, pupils equally reacting to light and accommodation.  icteric chest:  Clear breath sounds.  Diminished breath sounds bilaterally. No crackles or wheezes.  CVS: S1 &S2 heard. No murmur.  Regular rate and rhythm. Abdomen: Soft, bowel sounds are heard, mild fluid in abdomen, nontender palpation.   Extremities: No cyanosis, clubbing or edema.  Peripheral pulses are palpable. Psych: Alert, awake and oriented, normal mood CNS:  No cranial nerve deficits.  Moving all extremities  Skin: Warm and dry.  No rashes noted.   Data Review: I have  personally reviewed the following laboratory data and studies,  CBC: Recent Labs  Lab 01/10/20 0555 01/11/20 0640 01/12/20 0705 01/13/20 0552 01/14/20 0553  WBC 26.7* 24.3* 19.2* 24.9* 23.1*  NEUTROABS 23.8* 20.7* 16.4* 21.3* 20.1*  HGB 11.5* 11.1* 10.0* 9.9* 9.0*  HCT 34.9* 33.7* 29.3* 29.3* 26.6*  MCV 93.3 93.4 90.7 90.7 91.4  PLT 288 259 216 238 [id number]   Basic Metabolic Panel: Recent Labs  Lab 01/10/20 0555 01/11/20 0640 01/12/20 0705 01/13/20 0552 01/14/20 0553  NA 133* 132* 132* 133* 133*  K 4.8 4.1 3.5 3.7 3.6  CL 94* 98 99 97* 98  CO2 24 22 25 26 25   GLUCOSE 128* 104* 81 106* 107*  BUN 9 9 9 12 13   CREATININE 0.70 0.58* 0.52* 0.66 0.51*  CALCIUM 8.6* 8.8* 8.5* 8.5* 8.6*  MG 1.7 1.6* 1.8 1.7 1.4*  PHOS 2.6 1.8* 3.3 3.2 2.9   Liver Function Tests: Recent Labs  Lab 01/10/20 0555 01/11/20 0640 01/12/20 0705 01/13/20 0552 01/14/20 0553  AST 57* 56* 118* 92* 71*  ALT 31 28 46* 58* 58*  ALKPHOS 120 135* 115 123 119  BILITOT 7.5* 6.0* 6.2* 6.5* 6.5*  PROT 6.5 6.7 5.8* 6.0* 5.8*  ALBUMIN 2.1* 2.7* 2.2* 2.2* 2.1*   No results for input(s): LIPASE, AMYLASE in the last 168 hours. Recent Labs  Lab 01/13/20 0552  AMMONIA 73*   Cardiac Enzymes: No results for input(s): CKTOTAL, CKMB, CKMBINDEX, TROPONINI in the last 168 hours. BNP (last 3 results) Recent Labs    01/07/20 1345  BNP 211.4*    ProBNP (last 3 results) No results for input(s): PROBNP in the last 8760 hours.  CBG: No results for input(s): GLUCAP in the last 168 hours. Recent Results (from the past 240 hour(s))  SARS CORONAVIRUS 2 (TAT 6-24 HRS) Nasopharyngeal Nasopharyngeal Swab     Status: None   Collection Time: 01/07/20  2:25 AM   Specimen: Nasopharyngeal Swab  Result Value Ref Range Status   SARS Coronavirus 2 NEGATIVE NEGATIVE Final    Comment: (NOTE) SARS-CoV-2 target nucleic acids are NOT DETECTED. The SARS-CoV-2 RNA is generally detectable in upper and lower respiratory specimens  during the acute phase of infection. Negative results do not preclude SARS-CoV-2 infection, do not rule out co-infections with other pathogens, and should not be used as the sole basis for treatment or other patient management decisions. Negative results must be combined with clinical observations, patient history, and epidemiological information. The expected result is Negative. Fact Sheet for Patients: SugarRoll.be Fact Sheet for Healthcare Providers: https://www.woods-mathews.com/ This test is not yet approved or cleared by the [place] FDA and  has been authorized for detection and/or diagnosis of SARS-CoV-2 by FDA under an Emergency Use Authorization (EUA). This EUA will remain  in effect (meaning this test can be used) for the duration of the COVID-19 declaration under Section 56 4(b)(1) of the Act, 21 U.S.C. section 360bbb-3(b)(1), unless the authorization is terminated or revoked sooner. Performed at [facility], [place] [address], [place], [place] [address]   Body fluid culture     Status: None   Collection Time: 01/07/20  5:53 AM   Specimen: Peritoneal Cavity; Peritoneal Fluid  Result Value Ref Range Status   Specimen Description   Final    PERITONEAL CAVITY Performed at [facility] [address] [place], [place] [address]    Special Requests   Final    NONE Performed at [facility], [place] [address], [place], [place] [address]    Gram Stain   Final    WBC PRESENT,BOTH PMN AND MONONUCLEAR NO ORGANISMS SEEN Gram Stain Report Called to,Read Back By and Verified With: Mee Hives @ [phone] 01/07/2020 Performed at [facility], [place] [address] [place], [place] [address]    Culture   Final    NO GROWTH 3 DAYS Performed at [facility], [place] [address], [place], [place] [address]    Report Status 01/10/2020 FINAL  Final  Culture, blood (routine x 2)      Status: None   Collection Time: 01/07/20  9:44 AM   Specimen: BLOOD RIGHT HAND  Result Value Ref Range Status   Specimen Description   Final    BLOOD RIGHT HAND Performed at [facility] [address], [place], [place] [address]    Special Requests   Final    BOTTLES DRAWN AEROBIC ONLY Blood Culture results may not be optimal due to an inadequate volume of blood received in culture bottles Performed at [facility]  [address] [place], [place] [address]    Culture   Final    NO GROWTH 5 DAYS Performed at [facility], [place] [address], [place], [place] [address]    Report Status 01/12/2020 FINAL  Final  Culture, blood (routine x 2)     Status: None   Collection Time: 01/07/20  9:46 AM   Specimen: BLOOD  Result Value Ref Range Status   Specimen Description   Final    BLOOD LEFT ARM Performed at [facility] [address], [place], [place] [address]    Special Requests   Final    BOTTLES DRAWN AEROBIC ONLY BACTEROIDES CACCAE Performed at [facility], [place] [address], [place], [place] [address]    Culture   Final    NO GROWTH 5 DAYS Performed at [facility], [place] [address] [place], [place] [address]    Report Status 01/12/2020 FINAL  Final     Studies: No results found.    Flora Lipps, MD  [facility] 01/14/2020

## 2020-01-15 LAB — MAGNESIUM: Magnesium: 1.8 mg/dL (ref 1.7–2.4)

## 2020-01-15 LAB — CBC WITH DIFFERENTIAL/PLATELET
Abs Immature Granulocytes: 0.15 10*3/uL — ABNORMAL HIGH (ref 0.00–0.07)
Basophils Absolute: 0.1 10*3/uL (ref 0.0–0.1)
Basophils Relative: 0 %
Eosinophils Absolute: 0.1 10*3/uL (ref 0.0–0.5)
Eosinophils Relative: 0 %
HCT: 27.5 % — ABNORMAL LOW (ref 39.0–52.0)
Hemoglobin: 9.4 g/dL — ABNORMAL LOW (ref 13.0–17.0)
Immature Granulocytes: 1 %
Lymphocytes Relative: 6 %
Lymphs Abs: 1.5 10*3/uL (ref 0.7–4.0)
MCH: 31.6 pg (ref 26.0–34.0)
MCHC: 34.2 g/dL (ref 30.0–36.0)
MCV: 92.6 fL (ref 80.0–100.0)
Monocytes Absolute: 1.7 10*3/uL — ABNORMAL HIGH (ref 0.1–1.0)
Monocytes Relative: 8 %
Neutro Abs: 19.5 10*3/uL — ABNORMAL HIGH (ref 1.7–7.7)
Neutrophils Relative %: 85 %
Platelets: 251 10*3/uL (ref 150–400)
RBC: 2.97 MIL/uL — ABNORMAL LOW (ref 4.22–5.81)
RDW: 19.5 % — ABNORMAL HIGH (ref 11.5–15.5)
WBC: 23 10*3/uL — ABNORMAL HIGH (ref 4.0–10.5)
nRBC: 0 % (ref 0.0–0.2)

## 2020-01-15 LAB — COMPREHENSIVE METABOLIC PANEL
ALT: 55 U/L — ABNORMAL HIGH (ref 0–44)
AST: 56 U/L — ABNORMAL HIGH (ref 15–41)
Albumin: 2 g/dL — ABNORMAL LOW (ref 3.5–5.0)
Alkaline Phosphatase: 126 U/L (ref 38–126)
Anion gap: 10 (ref 5–15)
BUN: 13 mg/dL (ref 6–20)
CO2: 23 mmol/L (ref 22–32)
Calcium: 8.5 mg/dL — ABNORMAL LOW (ref 8.9–10.3)
Chloride: 101 mmol/L (ref 98–111)
Creatinine, Ser: 0.58 mg/dL — ABNORMAL LOW (ref 0.61–1.24)
GFR calc Af Amer: >60 mL/min (ref >60)
GFR calc non Af Amer: >60 mL/min (ref >60)
Glucose, Bld: 118 mg/dL — ABNORMAL HIGH (ref 70–99)
Potassium: 3.5 mmol/L (ref 3.5–5.1)
Sodium: 134 mmol/L — ABNORMAL LOW (ref 135–145)
Total Bilirubin: 5.6 mg/dL — ABNORMAL HIGH (ref 0.3–1.2)
Total Protein: 5.7 g/dL — ABNORMAL LOW (ref 6.5–8.1)

## 2020-01-15 LAB — PHOSPHORUS: Phosphorus: 2.8 mg/dL (ref 2.5–4.6)

## 2020-01-15 LAB — PROTIME-INR
INR: 1.9 — ABNORMAL HIGH (ref 0.8–1.2)
Prothrombin Time: 21.3 seconds — ABNORMAL HIGH (ref 11.4–15.2)

## 2020-01-15 MED ORDER — ENSURE ENLIVE PO LIQD
237.0000 mL | Freq: Two times a day (BID) | ORAL | Status: DC
  Administered 2020-01-16: 237 mL via ORAL

## 2020-01-15 NOTE — Progress Notes (Signed)
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  Of note, patient was recently discharged from the hospital on 12/14/2019 and ever since then he has not drank alcohol.  He has however noted increasing abdominal distention and lower extremity edema, so he decided come to the hospital.  ED Course:Upon arrival to the ED, patient is found to be afebrile, saturating mid 90s on room air, and with stable blood pressure. Chemistry panel notable for potassium 2.4, albumin of 1.9, AST 70, ALT 26, and bilirubin 9.3. CBC with leukocytosis to 23,700 and stable normocytic anemia. Lactic acid was 2.6. Patient was given 2 g IV Rocephin, 20 mEq IV potassium, fentanyl, and Zofran in the ED. COVID-19 PCR was negative.  Patient was then admitted to the hospital for further evaluation and treatment.  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  No evidence of SBP.  Abdominal pain has improved.   Continue Protonix.  Blood cultures and peritoneal fluid negative so far. On  prednisolone as per GI for acute alcoholic hepatitis.  Lilie score was 0.459 which indicates poor prognosis and response to prednisolone however it was close to borderline.  GI still recommends continuation of prednisolone to complete the course.  Fluid restriction has been removed.  Will need to continue on 2 gm sodium diet.     Patient did have a EGD colonoscopy on 3/12 which showed esophagitis at that time.  Completed 5-day course of Rocephin.  Leukocytosis likely from steroid but has plateaued.  Continue on  Lasix 40 daily, spironolactone 100 mg daily, thiamine and folic acid.  We will continue to monitor electrolytes closely.  Potassium 3.5 today.  Creatinine 0.5.  I recommend continuation of current dose of diuretics and follow-up with GI as outpatient.  INR 1.9.  GI does not recommend further paracentesis at this time.  Acute metabolic encephalopathy.  Unlikely hepatic encephalopathy.  Likely from fentanyl.  improved at this time.  Will however continue lactulose. Ammonia at 73.  Stable and improved.  EtOH cardiomyopathy/Acute on Chronic systolic CHF/prolonged QTC -EF 30 to 35% on echocardiogram 11/08/2018. 2/6 Echocardiogram showed ejection fraction at 60 to 65%.  Abnormal myocardial perfusion scan.  Cardiology recommended medical management..  Cardiology recommends aspirin and beta-blocker.  EtOH abuse in remission Patient has not drank alcohol since last admission.  He is motivated to abstain  Marijuana/tobacco use disorder. 4/6 urine drug screen positive marijuana.    Continue nicotine patch   hypomagnesemia Latest magnesium of 1.8.   Received IV magnesium sulfate and p.o. magnesium oxide.  Will likely benefit from p.o. magnesium oxide  on discharge.  Anxiety.  As needed Ativan.   Hypophosphatemia.  Replenished, latest phosphorus of 2.8.   VTE Prophylaxis: SCD  Code Status: Full code  Family Communication: None today.  I spoke with the patient's mother yesterday.  Disposition  Plan:  Current status inpatient due to alcoholic hepatitis on treatment, . Patient is from home . Likely disposition to home on 01/16/2020 patient will likely need medication assistance.  PT recommended no skilled therapy needs on discharge . Barriers to discharge: Titration of medication.  Medication assistance on discharge, left message for ToC, need for PCP arrangement, high risk of recurrent admission and noncompliance to medication.  Consultants:  IR  GI  Cardiology  Procedures:  01/07/2020.  Diagnostic paracentesis  01/08/2020.  Therapeutic paracentesis.  01/08/2020.  Myocardial perfusion scan.  Antibiotics:  . Rocephin iv 01/07/20>4/11  Anti-infectives (From admission, onward)   Start     Dose/Rate Route Frequency Ordered Stop   01/07/20 2200  cefTRIAXone (ROCEPHIN) 2 g in sodium chloride 0.9 % 100 mL IVPB  Status:  Discontinued     2 g 200 mL/hr over 30 Minutes Intravenous Every 24 hours 01/07/20 1613 01/12/20 1333   01/07/20 0330  cefTRIAXone (ROCEPHIN) 2 g in sodium chloride 0.9 % 100 mL IVPB     2 g 200 mL/hr over 30 Minutes Intravenous  Once 01/07/20 0325 01/07/20 0509     Subjective: Today, patient was seen and examined at bedside.  Denies  nausea vomiting.  Denies shortness of breath, cough or overt abdominal pain.  States that he has been moving his bowels.  Objective: Vitals:   01/15/20 0524 01/15/20 1428  BP: 108/73 99/73  Pulse: 71 71  Resp: 16 16  Temp: 98.4 F (36.9 C) 98 F (36.7 C)  SpO2: 97% 98%    Intake/Output Summary (Last 24 hours) at 01/15/2020 1623 Last data filed at 01/15/2020 1100 Gross per 24 hour  Intake 480 ml  Output 675 ml  Net -195 ml   Filed Weights   01/13/20 0444 01/13/20 0600 01/15/20 0500  Weight: 56.1 kg 56.4 kg 55.4 kg   Body mass index is 16.56 kg/m.   Physical Exam: General: Thinly built, not in obvious distress, alert awake and communicative.  Oriented  HENT: Normocephalic, pupils equally reacting to light and  accommodation.  icteric chest:  Clear breath sounds.  Diminished breath sounds bilaterally. No crackles or wheezes.  CVS: S1 &S2 heard. No murmur.  Regular rate and rhythm. Abdomen: Soft, bowel sounds are heard, mildly distended abdomen nontender palpation.   Extremities: No cyanosis, clubbing or edema.  Peripheral pulses are palpable. Psych: Alert, awake and oriented, normal mood CNS:  No cranial nerve deficits.  Moving all extremities  Skin: Warm and dry.  No rashes noted.   Data Review: I have personally reviewed the following laboratory data and studies,  CBC: Recent Labs  Lab 01/11/20 0640 01/12/20 0705 01/13/20 0552 01/14/20 0553 01/15/20 0552  WBC 24.3* 19.2* 24.9* 23.1* 23.0*  NEUTROABS 20.7* 16.4* 21.3* 20.1* 19.5*  HGB 11.1* 10.0* 9.9* 9.0* 9.4*  HCT 33.7* 29.3* 29.3* 26.6* 27.5*  MCV 93.4 90.7 90.7 91.4 92.6  PLT 259 216 238 242 [id number]   Basic Metabolic Panel: Recent Labs  Lab 01/11/20 0640 01/12/20 0705 01/13/20 0552 01/14/20 0553 01/15/20 0552  NA 132* 132* 133* 133* 134*  K 4.1 3.5 3.7 3.6 3.5  CL 98 99 97* 98 101  CO2 22 25 26 25 23   GLUCOSE 104* 81 106* 107* 118*  BUN 9 9 12 13 13   CREATININE 0.58* 0.52* 0.66 0.51* 0.58*  CALCIUM 8.8* 8.5* 8.5* 8.6* 8.5*  MG 1.6* 1.8 1.7 1.4* 1.8  PHOS 1.8* 3.3 3.2 2.9 2.8   Liver Function Tests: Recent Labs  Lab 01/11/20 0640 01/12/20 0705 01/13/20 0552 01/14/20 0553 01/15/20 0552  AST 56* 118* 92* 71* 56*  ALT 28 46* 58* 58* 55*  ALKPHOS 135* 115 123 119 126  BILITOT 6.0* 6.2* 6.5* 6.5* 5.6*  PROT 6.7 5.8* 6.0* 5.8* 5.7*  ALBUMIN 2.7* 2.2* 2.2* 2.1* 2.0*   No results for input(s): LIPASE, AMYLASE in the last 168 hours. Recent Labs  Lab 01/13/20 0552  AMMONIA 73*   Cardiac Enzymes: No results for input(s): CKTOTAL, CKMB, CKMBINDEX, TROPONINI in the last 168 hours. BNP (last 3 results) Recent Labs    01/07/20 1345  BNP 211.4*    ProBNP (last 3 results) No results for input(s): PROBNP in the last  8760 hours.  CBG: No results for input(s): GLUCAP in the last 168 hours. Recent Results (from the past 240 hour(s))  SARS CORONAVIRUS 2 (TAT 6-24 HRS) Nasopharyngeal Nasopharyngeal Swab     Status: None   Collection Time: 01/07/20  2:25 AM   Specimen: Nasopharyngeal Swab  Result Value Ref Range Status   SARS Coronavirus 2 NEGATIVE NEGATIVE Final    Comment: (NOTE) SARS-CoV-2 target nucleic acids are NOT DETECTED. The SARS-CoV-2 RNA is generally detectable in upper and lower respiratory specimens during the acute phase of infection. Negative results do not preclude SARS-CoV-2 infection, do not rule out co-infections with other pathogens, and should not be used as the sole basis for treatment or other patient management decisions. Negative results must be combined with clinical observations, patient history, and epidemiological information. The expected result is Negative. Fact Sheet for Patients: SugarRoll.be Fact Sheet for Healthcare Providers: https://www.woods-mathews.com/ This test is not yet approved or cleared by the [place] FDA and  has been authorized for detection and/or diagnosis of SARS-CoV-2 by FDA under an Emergency Use Authorization (EUA). This EUA will remain  in effect (meaning this test can be used) for the duration of the COVID-19 declaration under Section 56 4(b)(1) of the Act, 21 U.S.C. section 360bbb-3(b)(1), unless the authorization is terminated or revoked sooner. Performed at [facility], [place] [address], [place], [place] [address]   Body fluid culture     Status: None   Collection Time: 01/07/20  5:53 AM   Specimen: Peritoneal Cavity; Peritoneal Fluid  Result Value Ref Range Status   Specimen Description   Final    PERITONEAL CAVITY Performed at [place] [address], [place], [place] [address]    Special Requests   Final    NONE Performed at [facility], [place] [address], [place], [place] [address]    Gram Stain   Final    WBC PRESENT,BOTH PMN AND MONONUCLEAR NO ORGANISMS SEEN Gram Stain Report Called to,Read Back By and Verified With: Mee Hives @ [phone] 01/07/2020 Performed at [facility], [place] [address] [place], [place] [address]    Culture   Final    NO GROWTH 3 DAYS Performed at [facility], [place] [address] [place], [place] [address]    Report Status 01/10/2020 FINAL  Final  Culture, blood (routine x 2)     Status: None   Collection Time: 01/07/20  9:44 AM   Specimen: BLOOD RIGHT HAND  Result Value Ref Range Status  Specimen Description   Final    BLOOD RIGHT HAND Performed at [place] [address], [place], [place] [address]    Special Requests   Final    BOTTLES DRAWN AEROBIC ONLY Blood Culture results may not be optimal due to an inadequate volume of blood received in culture bottles Performed at [place] [address] [place], [place] [address]    Culture   Final    NO GROWTH 5 DAYS Performed at [facility], [place] [address] [place], [place] [address]    Report Status 01/12/2020 FINAL  Final  Culture, blood (routine x 2)     Status: None   Collection Time: 01/07/20  9:46 AM   Specimen: BLOOD  Result Value Ref Range Status   Specimen Description   Final    BLOOD LEFT ARM Performed at [facility] [address], [place], [place] [address]    Special Requests   Final    BOTTLES DRAWN AEROBIC ONLY BACTEROIDES CACCAE Performed at [facility], [place] [address], [place], [place] [address]    Culture   Final    NO GROWTH 5 DAYS Performed at [facility], [place] [address] [place],  [address]    Report Status 01/12/2020 FINAL  Final     Studies: No results found.    Flora Lipps, MD  [facility] 01/15/2020

## 2020-01-15 NOTE — Progress Notes (Deleted)
Medication assistance

## 2020-01-15 NOTE — Progress Notes (Signed)
Nutrition Follow-up  DOCUMENTATION CODES:   Not applicable  INTERVENTION:  Ensure Enlive po BID, each supplement provides 350 kcal and 20 grams of protein  Continue to monitor refeeding labs  NUTRITION DIAGNOSIS:   Inadequate oral intake related to acute illness(EtOH hepatitis) as evidenced by per patient/family report(severe abdominal pain, NPO for US guided paracentesis). Progressing, diet advanced to [facility]  GOAL:   Patient will meet greater than or equal to 90% of their needs Progressing   MONITOR:   Diet advancement, Labs, Weight trends, I & O's, Skin, PO intake  REASON FOR ASSESSMENT:   Malnutrition Screening Tool    ASSESSMENT:  RD working remotely.  54 year old male with past medical history of EtOH abuse in early remission, colon cancer s/p colectomy, cardiomyopathy with EF 30-35% presented with severe abdominal pain, abdominal distention, leg swelling and admitted on 4/6 for alcoholic hepatitis.  4/6 diagnostic paracentesis 4/7 therapeutic paracentesis 4/7 myocardial perfusion scan  Per flowsheets, patient is eating 75-100% of most meals, 1200 ml fluid restriction removed on 4/13. Will provide Ensure supplement to aid with estimated needs.  Current wt 123 lbs            Admit wt 125 lbs  Per notes: -acute metabolic encephalopathy, stable and improved -EtOH abuse in remission and motivated to abstain -no further paracentesis recommended at this time per GI -likely benefit from po magnesium oxide on discharge -no skilled therapy needs on discharge per PT  Medications reviewed and include: folic acid, lasix, lactulose, mag-ox, MVI, thiamine  Labs: Na 134 (L) K/Mg/P - WNL  Diet Order:   Diet Order            Diet Heart Room service appropriate? Yes; Fluid consistency: Thin  Diet effective now              EDUCATION NEEDS:   Not appropriate for education at this time  Skin:  Skin Assessment: Reviewed RN Assessment(juandice)  Last BM:   4/14  Height:   Ht Readings from Last 1 Encounters:  01/06/20 6' (1.829 m)    Weight:   Wt Readings from Last 1 Encounters:  01/15/20 55.4 kg    Ideal Body Weight:     BMI:  Body mass index is 16.56 kg/m.  Estimated Nutritional Needs:   Kcal:  1800-2000  Protein:  90-100  Fluid:  >/= 1.8 L/day   Lajuan Lines, RD, LDN Clinical Nutrition After Hours/Weekend Pager # in [place]

## 2020-01-15 NOTE — Progress Notes (Addendum)
[facility] Progress Note  CC:  Alcoholic hepatitis   Subjective:  He feels well today. No N/V or abdominal pain. He is ambulating up in his room.   Objective:  Vital signs in last 24 hours: Temp:  [97.4 F (36.3 C)-98.4 F (36.9 C)] 98.4 F (36.9 C) (04/14 0524) Pulse Rate:  [70-71] 71 (04/14 0524) Resp:  [16] 16 (04/14 0524) BP: (101-108)/(63-73) 108/73 (04/14 0524) SpO2:  [95 %-97 %] 97 % (04/14 0524) Weight:  [55.4 kg] 55.4 kg (04/14 0500) Last BM Date: 01/15/20 General:  Thin appearing 54 year old male in NAD.  Eyes: Scleral icterus present.  Heart: RRR, no murmur.  Pulm: Lungs clear throughout.  Abdomen: Soft, protuberant with mild ascites, abdomen is not tense. + BS x 4 quadrants.  Extremities:  Without edema. Neurologic:  Alert and  oriented x4. No asterixis.  Psych:  Alert and cooperative. Normal mood and affect.  Intake/Output from previous day: 04/13 0701 - 04/14 0700 In: 600 [P.O.:600] Out: 875 [Urine:875] Intake/Output this shift: No intake/output data recorded.  Lab Results: Recent Labs    01/13/20 0552 01/14/20 0553 01/15/20 0552  WBC 24.9* 23.1* 23.0*  HGB 9.9* 9.0* 9.4*  HCT 29.3* 26.6* 27.5*  PLT 238 242 251   BMET Recent Labs    01/13/20 0552 01/14/20 0553 01/15/20 0552  NA 133* 133* 134*  K 3.7 3.6 3.5  CL 97* 98 101  CO2 '26 25 23  ' GLUCOSE 106* 107* 118*  BUN '12 13 13  ' CREATININE 0.66 0.51* 0.58*  CALCIUM 8.5* 8.6* 8.5*   LFT Recent Labs    01/14/20 0553 01/14/20 0553 01/15/20 0552  PROT 5.8*   < > 5.7*  ALBUMIN 2.1*   < > 2.0*  AST 71*   < > 56*  ALT 58*   < > 55*  ALKPHOS 119   < > 126  BILITOT 6.5*   < > 5.6*  BILIDIR 3.8*  --   --   IBILI 2.7*  --   --    < > = values in this interval not displayed.   PT/INR Recent Labs    01/14/20 0553 01/15/20 0552  LABPROT 18.5* 21.3*  INR 1.6* 1.9*   Hepatitis Panel No results for input(s): HEPBSAG, HCVAB, HEPAIGM, HEPBIGM in the last 72 hours.  No  results found.  Assessment / Plan:  46. 54 year old male with sever alcoholic hepatitis with ascites and peripheral edema. No EtOH for 1 month. Cirrhosis by imaging. S/P paracentesis 4/7  (3.1 L peritoneal fluid removed) and 4/9  (2.9 L peritoneal fluid removed) without evidence of SBP. MDF >32 on admission therefore Prednisolone 81m daily started on 4/8. Lille score Day # 7 today is 0.335 which indicates he is responding. LFTs drifting downward. Today AST 56. ALT 55. Alk phos 126. T.bili 5.6.  No signs of hepatic encephalopathy on exam.  Currently on Furosemide 447mpo bid and Spironolactone 10033maily. Hemodynamically stable.  -Continue Prednisolone 23m35mily for a total of 28 days -Continue daily weights -Stop fluid restriction.  No change in diuretic dose for now.  -May need repeat paracentesis next day or two   2. Elevated LFTs secondary to # 1.   3. Coagulopathy secondary to # 1. Received Vitamin K 5mg 38mx 1 on 4/11. Today INR 1.9 up from 1.6.   4. Normocytic anemia. Hg 9.9 -> 9.0.  No signs of active GI bleeding.   5. Leukocytosis most likely due  to Prednisolone. WBC 23. He is afebrile.    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

## 2020-01-16 ENCOUNTER — Other Ambulatory Visit: Payer: Self-pay | Admitting: Nurse Practitioner

## 2020-01-16 ENCOUNTER — Ambulatory Visit: Payer: Self-pay

## 2020-01-16 DIAGNOSIS — K7011 Alcoholic hepatitis with ascites: Secondary | ICD-10-CM

## 2020-01-16 LAB — COMPREHENSIVE METABOLIC PANEL
ALT: 61 U/L — ABNORMAL HIGH (ref 0–44)
AST: 82 U/L — ABNORMAL HIGH (ref 15–41)
Albumin: 2 g/dL — ABNORMAL LOW (ref 3.5–5.0)
Alkaline Phosphatase: 120 U/L (ref 38–126)
Anion gap: 8 (ref 5–15)
BUN: 16 mg/dL (ref 6–20)
CO2: 24 mmol/L (ref 22–32)
Calcium: 8.3 mg/dL — ABNORMAL LOW (ref 8.9–10.3)
Chloride: 102 mmol/L (ref 98–111)
Creatinine, Ser: 0.6 mg/dL — ABNORMAL LOW (ref 0.61–1.24)
GFR calc Af Amer: >60 mL/min (ref >60)
GFR calc non Af Amer: >60 mL/min (ref >60)
Glucose, Bld: 105 mg/dL — ABNORMAL HIGH (ref 70–99)
Potassium: 3.3 mmol/L — ABNORMAL LOW (ref 3.5–5.1)
Sodium: 134 mmol/L — ABNORMAL LOW (ref 135–145)
Total Bilirubin: 5.1 mg/dL — ABNORMAL HIGH (ref 0.3–1.2)
Total Protein: 5.4 g/dL — ABNORMAL LOW (ref 6.5–8.1)

## 2020-01-16 LAB — CBC WITH DIFFERENTIAL/PLATELET
Abs Immature Granulocytes: 0.11 10*3/uL — ABNORMAL HIGH (ref 0.00–0.07)
Basophils Absolute: 0 10*3/uL (ref 0.0–0.1)
Basophils Relative: 0 %
Eosinophils Absolute: 0.2 10*3/uL (ref 0.0–0.5)
Eosinophils Relative: 1 %
HCT: 25.1 % — ABNORMAL LOW (ref 39.0–52.0)
Hemoglobin: 8.4 g/dL — ABNORMAL LOW (ref 13.0–17.0)
Immature Granulocytes: 1 %
Lymphocytes Relative: 6 %
Lymphs Abs: 1.3 10*3/uL (ref 0.7–4.0)
MCH: 31.3 pg (ref 26.0–34.0)
MCHC: 33.5 g/dL (ref 30.0–36.0)
MCV: 93.7 fL (ref 80.0–100.0)
Monocytes Absolute: 1.7 10*3/uL — ABNORMAL HIGH (ref 0.1–1.0)
Monocytes Relative: 8 %
Neutro Abs: 16.4 10*3/uL — ABNORMAL HIGH (ref 1.7–7.7)
Neutrophils Relative %: 84 %
Platelets: 245 10*3/uL (ref 150–400)
RBC: 2.68 MIL/uL — ABNORMAL LOW (ref 4.22–5.81)
RDW: 19.6 % — ABNORMAL HIGH (ref 11.5–15.5)
WBC: 19.6 10*3/uL — ABNORMAL HIGH (ref 4.0–10.5)
nRBC: 0 % (ref 0.0–0.2)

## 2020-01-16 LAB — MAGNESIUM: Magnesium: 1.6 mg/dL — ABNORMAL LOW (ref 1.7–2.4)

## 2020-01-16 LAB — PHOSPHORUS: Phosphorus: 2.9 mg/dL (ref 2.5–4.6)

## 2020-01-16 LAB — PROTIME-INR
INR: 1.9 — ABNORMAL HIGH (ref 0.8–1.2)
Prothrombin Time: 21.6 seconds — ABNORMAL HIGH (ref 11.4–15.2)

## 2020-01-16 MED ORDER — ASPIRIN 81 MG PO TBEC: 81.0000 mg | DELAYED_RELEASE_TABLET | Freq: Every day | ORAL | 2 refills | Status: DC

## 2020-01-16 MED ORDER — MAGNESIUM OXIDE 400 (241.3 MG) MG PO TABS: 400.0000 mg | ORAL_TABLET | Freq: Every day | ORAL | 0 refills | Status: DC

## 2020-01-16 MED ORDER — PREDNISOLONE 5 MG PO TABS: 40.0000 mg | ORAL_TABLET | Freq: Every day | ORAL | 0 refills | Status: DC

## 2020-01-16 MED ORDER — LACTULOSE 10 GM/15ML PO SOLN: 20.0000 g | Freq: Two times a day (BID) | ORAL | 2 refills | Status: DC

## 2020-01-16 MED ORDER — PREDNISONE 20 MG PO TABS: 40.0000 mg | ORAL_TABLET | Freq: Every day | ORAL | 0 refills | Status: AC

## 2020-01-16 MED ORDER — FUROSEMIDE 40 MG PO TABS: 40.0000 mg | ORAL_TABLET | Freq: Every day | ORAL | 2 refills | Status: DC

## 2020-01-16 MED ORDER — SPIRONOLACTONE 100 MG PO TABS: 100.0000 mg | ORAL_TABLET | Freq: Every day | ORAL | 2 refills | Status: DC

## 2020-01-16 MED ORDER — METOPROLOL SUCCINATE ER 25 MG PO TB24: 25.0000 mg | ORAL_TABLET | Freq: Every day | ORAL | 2 refills | Status: DC

## 2020-01-16 MED FILL — FUROSEMIDE 40 MG TAB: 40 | 30 days supply | Qty: 30 | Fill #0

## 2020-01-16 MED FILL — SPIRONOLACTONE 100 MG TAB: 100 | 30 days supply | Qty: 30 | Fill #0

## 2020-01-16 MED FILL — METOPROLOL SUCCINATE ER 25: 25 | 30 days supply | Qty: 30 | Fill #0

## 2020-01-16 MED FILL — predniSONE 20 MG TABS: 20 | 21 days supply | Qty: 42 | Fill #0

## 2020-01-16 MED FILL — LACTULOSE 10 GM/15 ML SOLN: 10 | 30 days supply | Qty: 1800 | Fill #0

## 2020-01-16 NOTE — TOC Transition Note (Signed)
Transition of Care [facility]) - CM/SW Discharge Note   Patient Details  Name: Richard [name] MRN: [id number] Date of Birth: 04-Jun-1966  [facility]) CM/SW Contact:  Trish Mage, LCSW Phone Number: 01/16/2020, 9:57 AM   Clinical Narrative:   Patient to d/c home today-will be staying with brother in [place].  Either brother or mother will transport home.  Mother asked that scripts be sent to [facility] outpatient pharmacy, and CSW will either give patient [facility] voucher or make sure meds are no more than $5. copay per discussion with mother.  Hospital follow up appointment in place.  No further needs identified. TOC sign off.    Final next level of care: Home/Self Care Barriers to Discharge: No Barriers Identified   Patient Goals and CMS Choice Patient states their goals for this hospitalization and ongoing recovery are:: "I have not been drinking.  It's bad for my liver."      Discharge Placement                       Discharge Plan and Services   Discharge Planning Services: CM Consult                                 Social Determinants of Health (SDOH) Interventions     Readmission Risk Interventions No flowsheet data found.

## 2020-01-16 NOTE — Plan of Care (Signed)
  Problem: Education: Goal: Knowledge of General Education information will improve Description: Including pain rating scale, medication(s)/side effects and non-pharmacologic comfort measures Outcome: Adequate for Discharge   Problem: Health Behavior/Discharge Planning: Goal: Ability to manage health-related needs will improve Outcome: Adequate for Discharge   Problem: Clinical Measurements: Goal: Ability to maintain clinical measurements within normal limits will improve 01/16/2020 1344 by Beckey Downing, RN Outcome: Adequate for Discharge 01/16/2020 0811 by Beckey Downing, RN Outcome: Adequate for Discharge Goal: Will remain free from infection 01/16/2020 1344 by Beckey Downing, RN Outcome: Adequate for Discharge 01/16/2020 0811 by Beckey Downing, RN Outcome: Adequate for Discharge Goal: Diagnostic test results will improve 01/16/2020 1344 by Beckey Downing, RN Outcome: Adequate for Discharge 01/16/2020 0811 by Beckey Downing, RN Outcome: Adequate for Discharge Goal: Respiratory complications will improve 01/16/2020 1344 by Beckey Downing, RN Outcome: Adequate for Discharge 01/16/2020 0811 by Beckey Downing, RN Outcome: Adequate for Discharge Goal: Cardiovascular complication will be avoided 01/16/2020 1344 by Beckey Downing, RN Outcome: Adequate for Discharge 01/16/2020 0811 by Beckey Downing, RN Outcome: Adequate for Discharge   Problem: Activity: Goal: Risk for activity intolerance will decrease Outcome: Adequate for Discharge   Problem: Elimination: Goal: Will not experience complications related to bowel motility Outcome: Adequate for Discharge Goal: Will not experience complications related to urinary retention Outcome: Adequate for Discharge   Problem: Coping: Goal: Level of anxiety will decrease Outcome: Adequate for Discharge   Problem: Nutrition: Goal: Adequate nutrition will be maintained Outcome: Adequate for Discharge   Problem: Pain  Managment: Goal: General experience of comfort will improve Outcome: Adequate for Discharge

## 2020-01-16 NOTE — Discharge Instructions (Signed)
Please go to [facility] office at [place], [place] [address] on Monday 01/20/20 between the hours of 7:30 am and 4:00 pm to have labs drawn. Our lab is located in the basement of the building.

## 2020-01-16 NOTE — Plan of Care (Signed)
  Problem: Clinical Measurements: Goal: Ability to maintain clinical measurements within normal limits will improve Outcome: Adequate for Discharge Goal: Will remain free from infection Outcome: Adequate for Discharge Goal: Diagnostic test results will improve Outcome: Adequate for Discharge Goal: Respiratory complications will improve Outcome: Adequate for Discharge Goal: Cardiovascular complication will be avoided Outcome: Adequate for Discharge   

## 2020-01-16 NOTE — Discharge Summary (Signed)
Physician Discharge Summary  Richard [name] [id number] DOB: 26-Jan-1966 DOA: 01/06/2020  PCP: Richard Blackbird, MD  Admit date: 01/06/2020 Discharge date: 01/16/2020  Admitted From: Home  Discharge disposition: Home    Recommendations for Outpatient Follow-Up:   . Follow-up with your primary care provider on on 02/05/2020, cardiology on 02/03/2020, GI on 01/29/2020. Marland Kitchen Check CBC, BMP in the next visit. . Prednisolone was recommended but due to financial issues pharmacy would be able to provide prednisone 40 mg daily.   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  Diet recommendation: Low sodium, heart healthy.    Wound care: None.  Code status: Full.   History of Present Illness:   Richard [name] a 54 y.o.malePMHx ofEtOHin early remission,drug abuse, , pancreatitis, PUD,colon cancer s/pcolectomy, EtOHCardiomyopathy with EF 30 to 35% in February 2020,HTN presented to hospital with complaints of abdominal pain, distention and leg swelling.  Of note, patient was recently discharged from the hospital on 12/14/2019 and ever since then he has not drank alcohol.  He has however noted increasing abdominal distention and lower extremity edema, so he decided come to the hospital.  ED Course:Upon arrival to the ED, patient is found to be afebrile, saturating mid 90s on room air, and with stable blood pressure. Chemistry panel notable for potassium 2.4, albumin of 1.9, AST 70,  ALT 26, and bilirubin 9.3. CBC with leukocytosis to 23,700 and stable normocytic anemia. Lactic acid was 2.6. Patient was given 2 g IV Rocephin, 20 mEq IV potassium, fentanyl, and Zofran in the ED. COVID-19 PCR was negative.  Patient was then admitted to the hospital for further evaluation and treatment.   Hospital Course:   Following conditions were addressed during hospitalization as listed below,  ETOHHepatitis/Ascites with abdominal pain, coagulopathy Status post therapeutic paracentesis x2.  No evidence of SBP.  Abdominal pain improved.  Blood cultures and peritoneal fluid cultures were negative.  Patient was started on prednisolone for acute alcoholic hepatitis as per GI and will be continued on discharge.  Will need to continue on 2 gm sodium diet.     Patient did have a EGD/ colonoscopy on 3/12 which showed esophagitis at that time.  Completed 5-day course of Rocephin.  Leukocytosis likely from steroid but has plateaued.  Continue on  Lasix 40 daily, spironolactone 100 mg daily, thiamine and folic acid on discharge.  Patient does have an appointment with GI as outpatient.  Acute metabolic encephalopathy.    Resolved.  Unlikely hepatic encephalopathy.  Likely from fentanyl..    Continue lactulose discharge. Ammonia at 73-check for baseline evaluation.   EtOH cardiomyopathy/Acute onChronic systolic CHF/prolonged QTC -EF 30 to 35% on echocardiogram 11/08/2018. 01/07/20 2D-echocardiogram showed ejection fraction at 60 to 65%.  Abnormal myocardial perfusion scan.  Cardiology recommended medical management..  Cardiology recommended aspirin and beta-blocker discharge.  Patient does have an appointment with cardiology as outpatient.  EtOH abuse in remission Patient has not drank alcohol since last admission.  He is motivated  to abstain  Marijuana/tobacco use disorder. 4/6 urine drug screenpositive marijuana.      Will continue nicotine patch on discharge    hypomagnesemia/hypophosphatemia Replenished during hospitalization.  Disposition.  At this time, patient is stable for disposition home.  He will have to follow-up with GI, primary care provider and cardiology as outpatient.  Medical Consultants:    IR  GI  Cardiology  Procedures:     01/07/2020.  Diagnostic paracentesis  01/08/2020.  Therapeutic paracentesis.  01/08/2020.  Myocardial perfusion scan.  Subjective:   Today, patient feels okay.  Denies overt pain, Nausea, vomiting,  Discharge Exam:   Vitals:   01/15/20 2015 01/16/20 0447  BP: 121/82 106/67  Pulse: 70 69  Resp: 16 16  Temp: 98.3 F (36.8 C) 97.6 F (36.4 C)  SpO2: 98% 96%   Vitals:   01/15/20 1428 01/15/20 2015 01/16/20 0447 01/16/20 0500  BP: 99/73 121/82 106/67   Pulse: 71 70 69   Resp: 16 16 16    Temp: 98 F (36.7 C) 98.3 F (36.8 C) 97.6 F (36.4 C)   TempSrc: Oral Oral Oral   SpO2: 98% 98% 96%   Weight:    52.2 kg  Height:       General: Alert awake, not in obvious distress, thinly built HENT: pupils equally reacting to light, icterus noted.  Mild pallor.  Oral mucosa is moist.  Chest:  Clear breath sounds.  Diminished breath sounds bilaterally. No crackles or wheezes.  CVS: S1 &S2 heard. No murmur.  Regular rate and rhythm. Abdomen: Soft, nontender, mildly distended abdomen with ascites, bowel sounds are heard.   Extremities: No cyanosis, clubbing or edema.  Peripheral pulses are palpable. Psych: Alert, awake and oriented, normal mood CNS:  No cranial nerve deficits.  Power equal in all extremities.   Skin: Warm and dry.  No rashes noted.  The results of significant diagnostics from this hospitalization (including imaging, microbiology, ancillary and laboratory) are listed below for reference.     Diagnostic Studies:   NM Myocar Multi W/Spect W/Wall Motion / EF  Result Date: 01/09/2020  There was no ST segment deviation noted during stress.  Defect 1: There is a medium defect of  mild severity present in the basal inferolateral, mid inferolateral and apical lateral location.  This is an intermediate risk study.  The left ventricular ejection fraction is mildly decreased (45-54%).  Nuclear stress EF: 51%.  Intermediate risk study due to reversible inferolateral ischemia (moderate extent, mild severity) and mildly depressed left ventricular systolic function.   US Paracentesis  Result Date: 01/10/2020 INDICATION: Patient with history of alcoholic hepatitis, CHF, recurrent ascites. Request made for therapeutic paracentesis. EXAM: ULTRASOUND GUIDED THERAPEUTIC PARACENTESIS MEDICATIONS: None COMPLICATIONS: None immediate. PROCEDURE: Informed written consent was obtained from the patient after a discussion of the risks, benefits and alternatives to treatment. A timeout was performed prior to the initiation of the procedure. Initial ultrasound scanning demonstrates a moderate amount of ascites within the right mid to lower abdominal quadrant. The right mid to lower abdomen was prepped and draped in the usual sterile fashion. 1% lidocaine was used for local anesthesia. Following this, a 19 gauge, 7-cm, Yueh catheter was introduced. An ultrasound image was saved for documentation purposes. The paracentesis was performed. The catheter was removed and a dressing was applied. The patient tolerated the procedure well without immediate post procedural complication. Patient received post-procedure intravenous albumin; see nursing notes for details. FINDINGS: A total of approximately 2.9 liters of yellow fluid was  removed. IMPRESSION: Successful ultrasound-guided therapeutic paracentesis yielding 2.9 liters of peritoneal fluid. Read by: Rowe Robert, PA-C Electronically Signed   By: Jacqulynn Cadet M.D.   On: 01/10/2020 15:54     Labs:   Basic Metabolic Panel: Recent Labs  Lab 01/11/20 0640 01/11/20 0640 01/12/20 0705 01/12/20 0705 01/13/20 0552 01/13/20 0552 01/14/20 0553  01/15/20 0552 01/16/20 0609  NA 132*  --  132*  --  133*  --  133* 134*  --   K 4.1   < > 3.5   < > 3.7   < > 3.6 3.5  --   CL 98  --  99  --  97*  --  98 101  --   CO2 22  --  25  --  26  --  25 23  --   GLUCOSE 104*  --  81  --  106*  --  107* 118*  --   BUN 9  --  9  --  12  --  13 13  --   CREATININE 0.58*  --  0.52*  --  0.66  --  0.51* 0.58*  --   CALCIUM 8.8*  --  8.5*  --  8.5*  --  8.6* 8.5*  --   MG 1.6*   < > 1.8  --  1.7  --  1.4* 1.8 1.6*  PHOS 1.8*   < > 3.3  --  3.2  --  2.9 2.8 2.9   < > = values in this interval not displayed.   GFR Estimated Creatinine Clearance: 79.8 mL/min (A) (by C-G formula based on SCr of 0.58 mg/dL (L)). Liver Function Tests: Recent Labs  Lab 01/11/20 0640 01/12/20 0705 01/13/20 0552 01/14/20 0553 01/15/20 0552  AST 56* 118* 92* 71* 56*  ALT 28 46* 58* 58* 55*  ALKPHOS 135* 115 123 119 126  BILITOT 6.0* 6.2* 6.5* 6.5* 5.6*  PROT 6.7 5.8* 6.0* 5.8* 5.7*  ALBUMIN 2.7* 2.2* 2.2* 2.1* 2.0*   No results for input(s): LIPASE, AMYLASE in the last 168 hours. Recent Labs  Lab 01/13/20 0552  AMMONIA 73*   Coagulation profile Recent Labs  Lab 01/12/20 1023 01/13/20 0552 01/14/20 0553 01/15/20 0552 01/16/20 0609  INR 2.0* 2.1* 1.6* 1.9* 1.9*    CBC: Recent Labs  Lab 01/12/20 0705 01/13/20 0552 01/14/20 0553 01/15/20 0552 01/16/20 0609  WBC 19.2* 24.9* 23.1* 23.0* 19.6*  NEUTROABS 16.4* 21.3* 20.1* 19.5* 16.4*  HGB 10.0* 9.9* 9.0* 9.4* 8.4*  HCT 29.3* 29.3* 26.6* 27.5* 25.1*  MCV 90.7 90.7 91.4 92.6 93.7  PLT 216 238 242 251 245   Cardiac Enzymes: No results for input(s): CKTOTAL, CKMB, CKMBINDEX, TROPONINI in the last 168 hours. BNP: Invalid input(s): POCBNP CBG: No results for input(s): GLUCAP in the last 168 hours. D-Dimer No results for input(s): DDIMER in the last 72 hours. Hgb A1c No results for input(s): HGBA1C in the last 72 hours. Lipid Profile No results for input(s): CHOL, HDL, LDLCALC, TRIG, CHOLHDL,  LDLDIRECT in the last 72 hours. Thyroid function studies No results for input(s): TSH, T4TOTAL, T3FREE, THYROIDAB in the last 72 hours.  Invalid input(s): FREET3 Anemia work up No results for input(s): VITAMINB12, FOLATE, FERRITIN, TIBC, IRON, RETICCTPCT in the last 72 hours. Microbiology Recent Results (from the past 240 hour(s))  SARS CORONAVIRUS 2 (TAT 6-24 HRS) Nasopharyngeal Nasopharyngeal Swab     Status: None   Collection Time: 01/07/20  2:25 AM   Specimen: Nasopharyngeal Swab  Result Value Ref Range Status   SARS Coronavirus 2 NEGATIVE NEGATIVE Final    Comment: (NOTE) SARS-CoV-2 target nucleic acids are NOT DETECTED. The SARS-CoV-2 RNA is generally detectable in upper and lower respiratory specimens during the acute phase of infection. Negative results do not preclude SARS-CoV-2 infection, do not rule out co-infections with other pathogens, and should not be used as the sole basis for treatment or other patient management decisions. Negative results must be combined with clinical observations, patient history, and epidemiological information. The expected result is Negative. Fact Sheet for Patients: SugarRoll.be Fact Sheet for Healthcare Providers: https://www.woods-mathews.com/ This test is not yet approved or cleared by the [place] FDA and  has been authorized for detection and/or diagnosis of SARS-CoV-2 by FDA under an Emergency Use Authorization (EUA). This EUA will remain  in effect (meaning this test can be used) for the duration of the COVID-19 declaration under Section 56 4(b)(1) of the Act, 21 U.S.C. section 360bbb-3(b)(1), unless the authorization is terminated or revoked sooner. Performed at [facility], [place] [address], [place], [place] [address]   Body fluid culture     Status: None   Collection Time: 01/07/20  5:53 AM   Specimen: Peritoneal Cavity; Peritoneal Fluid  Result Value Ref Range Status    Specimen Description   Final    PERITONEAL CAVITY Performed at [facility] [address] [place], [place] [address]    Special Requests   Final    NONE Performed at [facility], [place] [address] [place], [place] [address]    Gram Stain   Final    WBC PRESENT,BOTH PMN AND MONONUCLEAR NO ORGANISMS SEEN Gram Stain Report Called to,Read Back By and Verified With: Mee Hives @ [phone] 01/07/2020 Performed at [facility], [place] [address], [place], [place] [address]    Culture   Final    NO GROWTH 3 DAYS Performed at [facility], [place] [address], [place], [place] [address]    Report Status 01/10/2020 FINAL  Final  Culture, blood (routine x 2)     Status: None   Collection Time: 01/07/20  9:44 AM   Specimen: BLOOD RIGHT HAND  Result Value Ref Range Status   Specimen Description   Final    BLOOD RIGHT HAND Performed at [facility] [address] [place], [place] [address]    Special Requests   Final    BOTTLES DRAWN AEROBIC ONLY Blood Culture results may not be optimal due to an inadequate volume of blood received in culture bottles Performed at [facility] [address], [place], [place] [address]    Culture   Final    NO GROWTH 5 DAYS Performed at [facility], [place] [address] [place], [place] [address]    Report Status 01/12/2020 FINAL  Final  Culture, blood (routine x 2)     Status: None   Collection Time: 01/07/20  9:46 AM   Specimen: BLOOD  Result Value Ref Range Status   Specimen Description   Final    BLOOD LEFT ARM Performed at [facility] [address] [place], [place] [address]    Special Requests   Final    BOTTLES DRAWN AEROBIC ONLY BACTEROIDES CACCAE Performed at [facility], [place] [address] [place], [place] [address]    Culture   Final    NO GROWTH 5 DAYS Performed at [facility], [address] [name], [place] [address]    Report Status 01/12/2020 FINAL  Final     Discharge Instructions:   Discharge Instructions     Call MD for:   Complete by: As directed    Worsening symptoms.   Diet - low sodium heart healthy   Complete by: As directed    NO added salt, avoid canned and processed foods   Discharge instructions   Complete by: As directed    Follow-up with your primary care physician has been scheduled.  Follow-up with cardiology and GI as well.  Take all your medications as prescribed.  Do not drink alcohol.  It is very important to have a follow-up with the doctors and continue to take your medication. Low salt diet is very important. Try to avoid tylenol.   Increase activity slowly   Complete by: As directed       Allergies as of 01/16/2020       Reactions   Aspirin Other (See Comments)   Acid reflux    Penicillins Hives   Has patient had a PCN reaction causing immediate rash, facial/tongue/throat swelling, SOB or lightheadedness with hypotension: yes Has patient had a PCN reaction causing severe rash involving mucus membranes or skin necrosis: no Has patient had a PCN reaction that required hospitalization: no Has patient had a PCN reaction occurring within the last 10 years: no If all of the above answers are "NO", then may proceed with Cephalosporin use.        Medication List     STOP taking these medications    acetaminophen 500 MG tablet Commonly known as: TYLENOL       TAKE these medications    aspirin 81 MG EC tablet Take 1 tablet (81 mg total) by mouth daily.   folic acid 1 MG tablet Commonly known as: FOLVITE Take 1 tablet (1 mg total) by mouth daily.   furosemide 40 MG tablet Commonly known as: LASIX Take 1 tablet (40 mg total) by mouth daily.   lactulose 10 GM/15ML solution Commonly known as: CHRONULAC Take 30 mLs (20 g total) by mouth 2 (two) times daily.   magnesium oxide 400 (241.3 Mg) MG tablet Commonly known as:  MAG-OX Take 1 tablet (400 mg total) by mouth daily.   metoprolol succinate 25 MG 24 hr tablet Commonly known as: TOPROL-XL Take 1 tablet (25 mg total) by mouth daily.   multivitamin with minerals Tabs tablet Take 1 tablet by mouth daily.   nicotine 21 mg/24hr patch Commonly known as: NICODERM CQ - dosed in mg/24 hours Place 1 patch (21 mg total) onto the skin daily.   pantoprazole 40 MG tablet Commonly known as: PROTONIX Take 1 tablet (40 mg total) by mouth 2 (two) times daily before a meal.   prednisoLONE 5 MG Tabs tablet Take 8 tablets (40 mg total) by mouth daily.   spironolactone 100 MG tablet Commonly known as: ALDACTONE Take 1 tablet (100 mg total) by mouth daily.   thiamine 100 MG tablet Take 1 tablet (100 mg total) by mouth daily.       Follow-up Information     Richard Blackbird, MD Follow up on 02/05/2020.   Specialty: Family Medicine Why: The first Wednesday in May at 2:30 for your hospital follow up appointment. Call to reschedule if this time does not work for you Contact information: [place] [address] [phone]         Tommie Raymond, NP Follow  up on 02/03/2020.   Specialty: Cardiology Why: hospital follow-up 5/3 at 9:45 AM Contact information: [address] [place] [address] [phone]         [facility]. Schedule an appointment as soon as possible for a visit in 2 week(s).   Specialty: Gastroenterology Why: Follow-up for your liver problem. Contact information: [address] [place] [phone]            Time coordinating discharge: 39 minutes  Signed:  Redford Behrle  [facility] 01/16/2020, 10:03 AM

## 2020-01-16 NOTE — Progress Notes (Signed)
Progress Note    ASSESSMENT AND PLAN:    54 yo male with pmh significant for CAD / etoh cardiomyopathy  # etoh hepatitis possibly superimposed on cirrhosis ( no evidence for portal htn other than ascites on CT scan and platelets are normal).  Katy Apo  Not calculated today as no bilirubin or creatinine levels drawn. Will obtain labs today. Patient says he is for discharge today. Continue prednisolone 40 mg until seen in office.  --continue lasix 40 / aldactone 100 mg daily --come to our office Monday for BMET. Will put this in discharge instructions.  --continue 2 gram sodium diet.  --Etoh avoidance.  -- follow up with Georgina Peer, NP on 4/28 at [facility] today. No abdominal pain. Says he is being discharged    OBJECTIVE:     Vital signs in last 24 hours: Temp:  [97.6 F (36.4 C)-98.3 F (36.8 C)] 97.6 F (36.4 C) (04/15 0447) Pulse Rate:  [69-71] 69 (04/15 0447) Resp:  [16] 16 (04/15 0447) BP: (99-121)/(67-82) 106/67 (04/15 0447) SpO2:  [96 %-98 %] 96 % (04/15 0447) Weight:  [52.2 kg] 52.2 kg (04/15 0500) Last BM Date: 01/15/20 General:   Alert, thin male in NAD Heart:  Regular rate and rhythm;  No lower extremity edema   Pulm: Normal respiratory effort   Abdomen:  Soft, moderately distended, non-tender.  Normal bowel sounds.          Neurologic:  Alert and  oriented x4;  grossly normal neurologically. No asterixis Psych:  Pleasant, cooperative.  Normal mood and affect.   Intake/Output from previous day: 04/14 0701 - 04/15 0700 In: 480 [P.O.:480] Out: 1250 [Urine:1250] Intake/Output this shift: No intake/output data recorded.  Lab Results: Recent Labs    01/14/20 0553 01/15/20 0552 01/16/20 0609  WBC 23.1* 23.0* 19.6*  HGB 9.0* 9.4* 8.4*  HCT 26.6* 27.5* 25.1*  PLT 242 251 245   BMET Recent Labs    01/14/20 0553 01/15/20 0552  NA 133* 134*  K 3.6 3.5  CL 98 101  CO2 25 23  GLUCOSE 107* 118*  BUN 13 13   CREATININE 0.51* 0.58*  CALCIUM 8.6* 8.5*   LFT Recent Labs    01/14/20 0553 01/14/20 0553 01/15/20 0552  PROT 5.8*   < > 5.7*  ALBUMIN 2.1*   < > 2.0*  AST 71*   < > 56*  ALT 58*   < > 55*  ALKPHOS 119   < > 126  BILITOT 6.5*   < > 5.6*  BILIDIR 3.8*  --   --   IBILI 2.7*  --   --    < > = values in this interval not displayed.   PT/INR Recent Labs    01/15/20 0552 01/16/20 0609  LABPROT 21.3* 21.6*  INR 1.9* 1.9*   Hepatitis Panel No results for input(s): HEPBSAG, HCVAB, HEPAIGM, HEPBIGM in the last 72 hours.  No results found.     Principal Problem:   Alcoholic hepatitis Active Problems:   Pancreatitis   HTN (hypertension)   Hypokalemia   Leukocytosis   ETOH abuse   Alcoholic cardiomyopathy (HCC)   Drug abuse (HCC)   Prolonged Q-T interval on ECG   DCM (dilated cardiomyopathy) (HCC)   Chronic systolic CHF (congestive heart failure) (HCC)   SBP (spontaneous bacterial peritonitis) ([place])   Tobacco abuse   Septal infarction ([place])   Chest pain of uncertain etiology   Abnormal nuclear  cardiac imaging test     LOS: 9 days   Tye Savoy ,NP 01/16/2020, 10:37 AM

## 2020-01-17 ENCOUNTER — Telehealth: Payer: Self-pay

## 2020-01-17 NOTE — Telephone Encounter (Signed)
Transition Care Management Follow-up Telephone Call  Date of discharge and from where: 01/16/2020, [facility]   How have you been since you were released from the hospital? He said that his stomach is still bloated and feet are still swollen.  He stated  that his feet have been like this for 3 years.   Any questions or concerns?no other questions/concerns reported at this time.   Items Reviewed:  Did the pt receive and understand the discharge instructions provided? he said that he has the discharge instructions and did not have any questions at this time.  Reviewed his upcoming appointments - dates/times.  He said he would call [facility] with any questions.   Medications obtained and verified? he said that he has all medications and sets them up in a medication box to remember when to take them.  He did not have any questions about the medications and did not want to review the list at this time. Reminded him that he has many new medications and it is very important that he takes them as ordered and he said that he understood. He said he would call the clinic if he has any questions.   Any new allergies since your discharge?   None reported  Dietary orders reviewed?importance of low sodium diet and refraining from alcohol reviewed  Do you have support at home? he is living with his brother  Other (ie: DME, [place], etc) no home health or DME ordered  Functional Questionnaire: (I = Independent and D = Dependent) ADL's:independent   Follow up appointments reviewed:    PCP Hospital f/u appt confirmed?Marland Kitchenappointment with Dr Chapman Fitch - 02/15/2020 @ [facility] f/u appt confirmed? GI - 01/29/2020; cardiology - 02/03/2020  Are transportation arrangements needed?  no, he said he would get transportation  If their condition worsens, is the pt aware to call  their PCP or go to the ED?  yes  Was the patient provided with contact information for the PCP's office or ED?provided  him with the phone number for the [facility] and reminded him that the phone numbers for [facility], GI and cardiology are also on his AVS  Was the pt encouraged to call back with questions or concerns?  yes

## 2020-01-20 ENCOUNTER — Other Ambulatory Visit (INDEPENDENT_AMBULATORY_CARE_PROVIDER_SITE_OTHER): Payer: Self-pay

## 2020-01-20 DIAGNOSIS — K7011 Alcoholic hepatitis with ascites: Secondary | ICD-10-CM

## 2020-01-20 LAB — BASIC METABOLIC PANEL
BUN: 14 mg/dL (ref 6–23)
CO2: 28 mEq/L (ref 19–32)
Calcium: 9 mg/dL (ref 8.4–10.5)
Chloride: 100 mEq/L (ref 96–112)
Creatinine, Ser: 0.54 mg/dL (ref 0.40–1.50)
GFR: 193.22 mL/min (ref >60.00)
Glucose, Bld: 115 mg/dL — ABNORMAL HIGH (ref 70–99)
Potassium: 3.1 mEq/L — ABNORMAL LOW (ref 3.5–5.1)
Sodium: 137 mEq/L (ref 135–145)

## 2020-01-20 LAB — PROTIME-INR
INR: 1.8 ratio — ABNORMAL HIGH (ref 0.8–1.0)
Prothrombin Time: 19.9 s — ABNORMAL HIGH (ref 9.6–13.1)

## 2020-01-22 ENCOUNTER — Emergency Department (HOSPITAL_COMMUNITY)
Admission: EM | Admit: 2020-01-22 | Discharge: 2020-01-22 | Disposition: A | Discharge status: Home / Self Care | Payer: Self-pay | Attending: Emergency Medicine | Admitting: Emergency Medicine

## 2020-01-22 ENCOUNTER — Emergency Department (HOSPITAL_COMMUNITY): Payer: Self-pay

## 2020-01-22 ENCOUNTER — Other Ambulatory Visit: Payer: Self-pay

## 2020-01-22 ENCOUNTER — Encounter (HOSPITAL_COMMUNITY): Payer: Self-pay | Admitting: Emergency Medicine

## 2020-01-22 DIAGNOSIS — F17210 Nicotine dependence, cigarettes, uncomplicated: Secondary | ICD-10-CM | POA: Insufficient documentation

## 2020-01-22 DIAGNOSIS — R109 Unspecified abdominal pain: Secondary | ICD-10-CM | POA: Insufficient documentation

## 2020-01-22 DIAGNOSIS — Z85038 Personal history of other malignant neoplasm of large intestine: Secondary | ICD-10-CM | POA: Insufficient documentation

## 2020-01-22 DIAGNOSIS — F1210 Cannabis abuse, uncomplicated: Secondary | ICD-10-CM | POA: Insufficient documentation

## 2020-01-22 DIAGNOSIS — R609 Edema, unspecified: Secondary | ICD-10-CM | POA: Insufficient documentation

## 2020-01-22 DIAGNOSIS — I5022 Chronic systolic (congestive) heart failure: Secondary | ICD-10-CM | POA: Insufficient documentation

## 2020-01-22 DIAGNOSIS — I110 Hypertensive heart disease with heart failure: Secondary | ICD-10-CM | POA: Insufficient documentation

## 2020-01-22 DIAGNOSIS — Z7982 Long term (current) use of aspirin: Secondary | ICD-10-CM | POA: Insufficient documentation

## 2020-01-22 DIAGNOSIS — Z79899 Other long term (current) drug therapy: Secondary | ICD-10-CM | POA: Insufficient documentation

## 2020-01-22 LAB — CBC WITH DIFFERENTIAL/PLATELET
Abs Immature Granulocytes: 0.13 10*3/uL — ABNORMAL HIGH (ref 0.00–0.07)
Basophils Absolute: 0 10*3/uL (ref 0.0–0.1)
Basophils Relative: 0 %
Eosinophils Absolute: 0.3 10*3/uL (ref 0.0–0.5)
Eosinophils Relative: 1 %
HCT: 30.3 % — ABNORMAL LOW (ref 39.0–52.0)
Hemoglobin: 10 g/dL — ABNORMAL LOW (ref 13.0–17.0)
Immature Granulocytes: 1 %
Lymphocytes Relative: 8 %
Lymphs Abs: 1.7 10*3/uL (ref 0.7–4.0)
MCH: 31.2 pg (ref 26.0–34.0)
MCHC: 33 g/dL (ref 30.0–36.0)
MCV: 94.4 fL (ref 80.0–100.0)
Monocytes Absolute: 1.5 10*3/uL — ABNORMAL HIGH (ref 0.1–1.0)
Monocytes Relative: 7 %
Neutro Abs: 18 10*3/uL — ABNORMAL HIGH (ref 1.7–7.7)
Neutrophils Relative %: 83 %
Platelets: 297 10*3/uL (ref 150–400)
RBC: 3.21 MIL/uL — ABNORMAL LOW (ref 4.22–5.81)
RDW: 19.4 % — ABNORMAL HIGH (ref 11.5–15.5)
WBC: 21.5 10*3/uL — ABNORMAL HIGH (ref 4.0–10.5)
nRBC: 0 % (ref 0.0–0.2)

## 2020-01-22 LAB — PROTIME-INR
INR: 1.5 — ABNORMAL HIGH (ref 0.8–1.2)
Prothrombin Time: 17.7 seconds — ABNORMAL HIGH (ref 11.4–15.2)

## 2020-01-22 LAB — COMPREHENSIVE METABOLIC PANEL
ALT: 78 U/L — ABNORMAL HIGH (ref 0–44)
AST: 67 U/L — ABNORMAL HIGH (ref 15–41)
Albumin: 2.5 g/dL — ABNORMAL LOW (ref 3.5–5.0)
Alkaline Phosphatase: 190 U/L — ABNORMAL HIGH (ref 38–126)
Anion gap: 7 (ref 5–15)
BUN: 16 mg/dL (ref 6–20)
CO2: 29 mmol/L (ref 22–32)
Calcium: 9.1 mg/dL (ref 8.9–10.3)
Chloride: 103 mmol/L (ref 98–111)
Creatinine, Ser: 0.78 mg/dL (ref 0.61–1.24)
GFR calc Af Amer: >60 mL/min (ref >60)
GFR calc non Af Amer: >60 mL/min (ref >60)
Glucose, Bld: 96 mg/dL (ref 70–99)
Potassium: 3.2 mmol/L — ABNORMAL LOW (ref 3.5–5.1)
Sodium: 139 mmol/L (ref 135–145)
Total Bilirubin: 4.3 mg/dL — ABNORMAL HIGH (ref 0.3–1.2)
Total Protein: 6.4 g/dL — ABNORMAL LOW (ref 6.5–8.1)

## 2020-01-22 NOTE — Progress Notes (Signed)
TOC CM spoke to pt and states he did pick up his medications when he was dc on 4/15. Pt was given MATCH and explained to him he can only use once per year. Pt will be utilizing the [facility] for his PCP and meds. Pt wanted CM to call his mother to give her an update. TOC CM contacted mother and explained how he will be getting his meds and follow up at [facility]. She will providing transportation for pt home. Pt was provided a RW through [employer]. Will leave paperwork for [facility] to process. [place], [place] ED TOC CM [phone]

## 2020-01-22 NOTE — Discharge Instructions (Addendum)
[facility] [address]  Lab work today was unremarkable.  Please follow-up with cardiology and GI as scheduled.

## 2020-01-22 NOTE — ED Triage Notes (Signed)
Pt states that just got out of here on the 4/15. Reports when he goes home and swelling in abd and feet. Reports having abd pains. Reports doesnt have feet swelling now due to having them elevated over the past couple days.

## 2020-01-22 NOTE — ED Provider Notes (Signed)
[facility] DEPT Provider Note   CSN: [id number] Arrival date & time: 01/22/20  1416     History Chief Complaint  Patient presents with  . Leg Swelling  . Abdominal Pain    [name] is a 54 y.o. male.  The history is provided by the patient.  Illness Location:  Legs Quality:  Swelling Severity:  Mild Onset quality:  Gradual Timing:  Constant Progression:  Worsening Chronicity:  Recurrent Context:  Heart and liver failure, recently started on diuretic after recent hosital admission. Mostly concerned he can get medication refilled. Still with some leg swelling but Richard current abdominal pain/SOB. Relieved by:  Nothign Worsened by:  Nothing Associated symptoms: Richard abdominal pain, Richard chest pain, Richard cough, Richard ear pain, Richard fever, Richard rash, Richard shortness of breath, Richard sore throat and Richard vomiting        Past Medical History:  Diagnosis Date  . Acid reflux   . Colon cancer ([place])   . ETOH abuse   . HTN (hypertension)   . Pancreatitis 08/2018  . PUD (peptic ulcer disease)     Patient Active Problem List   Diagnosis Date Noted  . Abnormal nuclear cardiac imaging test   . Chest pain of uncertain etiology   . Alcoholic hepatitis 123456  . Hypokalemia 01/07/2020  . Leukocytosis 01/07/2020  . ETOH abuse 01/07/2020  . 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 Drug abuse ([place]) 01/07/2020  . SBP (spontaneous bacterial peritonitis) ([place]) 01/07/2020  . Tobacco abuse 01/07/2020  . 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 Prolonged QT interval   . Prolonged Q-T interval on ECG   . DCM (dilated cardiomyopathy) ([place])   . Chronic systolic CHF (congestive heart failure) ([place])   . Polyp of ascending colon   . History of colon cancer   . Ascites 12/13/2019  . SVT (supraventricular tachycardia) ([place])   . Hypomagnesemia   . Hypophosphatemia   . High anion gap metabolic acidosis 123456  . Acute pancreatitis 08/14/2018  . Smoker  08/14/2018  . 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 HTN (hypertension) 08/14/2018  . Abdominal pain 06/27/2018  . Hypertensive urgency 06/27/2018  . Elevated troponin 06/27/2018  . Hypoglycemia 06/27/2018  . Protein-calorie malnutrition, severe 05/02/2018  . Pancreatitis 05/01/2018  . Heme positive stool 11/13/2017  . Alcoholic ketoacidosis [id number]  . Hepatic steatosis 08/21/2016  . Thrombocytopenia ([place]) 08/21/2016  . Alcohol abuse 08/21/2016  . Alcohol withdrawal delirium ([place]) 08/20/2016  . Dehydration 08/20/2016  . Intractable nausea and vomiting 08/20/2016  . Lactic acidosis 08/20/2016  . Aspiration pneumonia ([place]) 08/20/2016  . Sepsis ([place]) 08/20/2016    Past Surgical History:  Procedure Laterality Date  . BIOPSY  12/14/2019   Procedure: BIOPSY;  Surgeon: Mauri Pole, MD;  Location: [facility];  Service: Endoscopy;;  . COLONOSCOPY WITH PROPOFOL N/A 12/14/2019   Procedure: COLONOSCOPY WITH PROPOFOL;  Surgeon: Mauri Pole, MD;  Location: [facility];  Service: Endoscopy;  Laterality: N/A;  . ESOPHAGOGASTRODUODENOSCOPY (EGD) WITH PROPOFOL N/A 12/14/2019   Procedure: ESOPHAGOGASTRODUODENOSCOPY (EGD) WITH PROPOFOL;  Surgeon: Mauri Pole, MD;  Location: [facility];  Service: Endoscopy;  Laterality: N/A;  . LAPAROSCOPIC SIGMOID COLECTOMY  2007  . POLYPECTOMY  12/14/2019   Procedure: POLYPECTOMY;  Surgeon: Mauri Pole, MD;  Location: [facility];  Service: Endoscopy;;       Family History  Problem Relation Age of Onset  . Colon cancer Father   . Cancer Sister   . CAD Neg Hx   .  Stroke Neg Hx   . Diabetes Neg Hx     Social History   Tobacco Use  . Smoking status: Current Every Day Smoker    Packs/day: 1.00  . Smokeless tobacco: Never Used  Substance Use Topics  . Alcohol use: Yes    Alcohol/week: 2.0 standard drinks    Types: 1 Cans of beer, 1 Shots of liquor per week    Comment: heavy alcohol abuse a beer and couple of shots a day for  past  14 years  . Drug use: Yes    Types: Marijuana    Home Medications Prior to Admission medications   Medication Sig Start Date End Date Taking? Authorizing Provider  folic acid (FOLVITE) 1 MG tablet Take 1 tablet (1 mg total) by mouth daily. Patient taking differently: Take 1 mg by mouth at bedtime.  12/14/19  Yes Hongalgi, Lenis Dickinson, MD  furosemide (LASIX) 40 MG tablet Take 1 tablet (40 mg total) by mouth daily. Patient taking differently: Take 40 mg by mouth at bedtime.  01/16/20  Yes Pokhrel, Laxman, MD  lactulose (CHRONULAC) 10 GM/15ML solution Take 30 mLs (20 g total) by mouth 2 (two) times daily. Patient taking differently: Take 20 g by mouth at bedtime.  01/16/20 04/15/20 Yes Pokhrel, Laxman, MD  magnesium oxide (MAG-OX) 400 (241.3 Mg) MG tablet Take 1 tablet (400 mg total) by mouth daily. Patient taking differently: Take 400 mg by mouth at bedtime.  01/16/20  Yes Pokhrel, Laxman, MD  metoprolol succinate (TOPROL-XL) 25 MG 24 hr tablet Take 1 tablet (25 mg total) by mouth daily. Patient taking differently: Take 25 mg by mouth at bedtime.  01/16/20  Yes Pokhrel, Laxman, MD  Multiple Vitamin (MULTIVITAMIN WITH MINERALS) TABS tablet Take 1 tablet by mouth daily. 12/15/19  Yes Hongalgi, Lenis Dickinson, MD  nicotine (NICODERM CQ - DOSED IN MG/24 HOURS) 21 mg/24hr patch Place 1 patch (21 mg total) onto the skin daily. 12/15/19  Yes Hongalgi, Lenis Dickinson, MD  pantoprazole (PROTONIX) 40 MG tablet Take 1 tablet (40 mg total) by mouth 2 (two) times daily before a meal. Patient taking differently: Take 80 mg by mouth at bedtime.  12/14/19  Yes Hongalgi, Lenis Dickinson, MD  prednisoLONE 5 MG TABS tablet Take 8 tablets (40 mg total) by mouth daily. 01/16/20  Yes Pokhrel, Laxman, MD  predniSONE (DELTASONE) 20 MG tablet Take 2 tablets (40 mg total) by mouth daily with breakfast for 7 days. 01/16/20 01/23/20 Yes Pokhrel, Laxman, MD  spironolactone (ALDACTONE) 100 MG tablet Take 1 tablet (100 mg total) by mouth daily. Patient  taking differently: Take 100 mg by mouth at bedtime.  01/16/20  Yes Pokhrel, Laxman, MD  thiamine 100 MG tablet Take 1 tablet (100 mg total) by mouth daily. Patient taking differently: Take 100 mg by mouth at bedtime.  12/14/19  Yes Hongalgi, Lenis Dickinson, MD  aspirin EC 81 MG EC tablet Take 1 tablet (81 mg total) by mouth daily. Patient not taking: Reported on 01/22/2020 01/16/20   Flora Lipps, MD    Allergies    Aspirin and Penicillins  Review of Systems   Review of Systems  Constitutional: Negative for chills and fever.  HENT: Negative for ear pain and sore throat.   Eyes: Negative for pain and visual disturbance.  Respiratory: Negative for cough and shortness of breath.   Cardiovascular: Positive for leg swelling. Negative for chest pain and palpitations.  Gastrointestinal: Negative for abdominal pain and vomiting.  Genitourinary: Negative for dysuria and hematuria.  Musculoskeletal: Negative for arthralgias and back pain.  Skin: Negative for color change and rash.  Neurological: Negative for seizures and syncope.  All other systems reviewed and are negative.   Physical Exam Updated Vital Signs BP 118/71   Pulse 76   Temp 98.2 F (36.8 C) (Oral)   Resp 16   SpO2 98%   Physical Exam Vitals and nursing note reviewed.  Constitutional:      Appearance: He is well-developed.  HENT:     Head: Normocephalic and atraumatic.     Mouth/Throat:     Mouth: Mucous membranes are moist.  Eyes:     Extraocular Movements: Extraocular movements intact.     Conjunctiva/sclera: Conjunctivae normal.  Cardiovascular:     Rate and Rhythm: Normal rate and regular rhythm.     Heart sounds: Normal heart sounds. Richard murmur.  Pulmonary:     Effort: Pulmonary effort is normal. Richard respiratory distress.     Breath sounds: Normal breath sounds.  Abdominal:     General: Abdomen is flat. There is Richard distension.     Palpations: Abdomen is soft.     Tenderness: There is Richard abdominal tenderness. There  is Richard right CVA tenderness, left CVA tenderness, guarding or rebound. Negative signs include Murphy's sign, Rovsing's sign, McBurney's sign and psoas sign.  Musculoskeletal:     Cervical back: Neck supple.  Skin:    General: Skin is warm and dry.     Capillary Refill: Capillary refill takes less than 2 seconds.     Comments: 1+ pitting edema b/l in legs  Neurological:     General: Richard focal deficit present.     Mental Status: He is alert.     ED Results / Procedures / Treatments   Labs (all labs ordered are listed, but only abnormal results are displayed) Labs Reviewed  CBC WITH DIFFERENTIAL/PLATELET - Abnormal; Notable for the following components:      Result Value   WBC 21.5 (*)    RBC 3.21 (*)    Hemoglobin 10.0 (*)    HCT 30.3 (*)    RDW 19.4 (*)    Neutro Abs 18.0 (*)    Monocytes Absolute 1.5 (*)    Abs Immature Granulocytes 0.13 (*)    All other components within normal limits  COMPREHENSIVE METABOLIC PANEL - Abnormal; Notable for the following components:   Potassium 3.2 (*)    Total Protein 6.4 (*)    Albumin 2.5 (*)    AST 67 (*)    ALT 78 (*)    Alkaline Phosphatase 190 (*)    Total Bilirubin 4.3 (*)    All other components within normal limits  PROTIME-INR - Abnormal; Notable for the following components:   Prothrombin Time 17.7 (*)    INR 1.5 (*)    All other components within normal limits    EKG None  Radiology DG Chest Portable 1 View  Result Date: 01/22/2020 CLINICAL DATA:  Leg swelling and abdominal pain. History of pancreatitis. EXAM: PORTABLE CHEST 1 VIEW COMPARISON:  Radiographs 04/27/2019 and 11/05/2018. CT 08/31/2007. FINDINGS: 1544 hours. Lower lung volumes with resulting subsegmental atelectasis at both lung bases. The lungs are otherwise clear. Mild paraseptal emphysematous changes are present at both lung apices. The heart size and mediastinal contours are stable. There is Richard pleural effusion or pneumothorax. Richard acute osseous findings.  IMPRESSION: Lower lung volumes with resulting subsegmental atelectasis. Richard other significant changes. Electronically Signed   By: Caryl Comes.D.  On: 01/22/2020 16:13    Procedures Procedures (including critical care time)  Medications Ordered in ED Medications - Richard data to display  ED Course  I have reviewed the triage vital signs and the nursing notes.  Pertinent labs & imaging results that were available during my care of the patient were reviewed by me and considered in my medical decision making (see chart for details).    MDM Rules/Calculators/A&P                      Juanantonio Caminero is a 54 year old male with history of alcohol abuse, reflux, liver disease, heart failure who presents to the ED with leg swelling.  Patient with overall unremarkable vitals.  Richard fever.  Recent hospital admission where he was started on diuretics.  Had multiple paracentesis.  Appears that maybe he has liver failure likely due to alcohol abuse and possibly heart failure as well.  Was started on spironolactone, Lasix.  Has follow-up with both cardiology and GI next week.  Patient states continued swelling of his legs but overall denies shortness of breath.  Sometimes abdominal pain but none currently.  Quick bedside ultrasound shows Richard large amount of ascites.  Richard abdominal tenderness on exam.  Richard concern for SBP.  Upon further evaluation patient mostly concerned about getting his medications refilled.  He was a candidate for medication assistance and states the pharmacy does not have all the paperwork that they need to supply his next round of medications for low cost.  Will check basic labs and consult social work to make sure that patient can afford his medications.  He already has cardiology and GI follow-up in place.  Richard significant anemia, electrolyte abnormality, kidney injury.  Chest x-ray overall unremarkable.  Social work has arranged for patient to pick up prescriptions at [facility] [employer] and  wellness.  Labs overall at baseline.  He knows to follow-up with cardiology and GI.  Discharged in good condition.  This chart was dictated using voice recognition software.  Despite best efforts to proofread,  errors can occur which can change the documentation meaning.     Final Clinical Impression(s) / ED Diagnoses Final diagnoses:  Peripheral edema    Rx / DC Orders ED Discharge Orders    None       Lennice Sites, DO 01/22/20 1742

## 2020-01-24 NOTE — Progress Notes (Deleted)
Cardiology Office Note   Date:  01/24/2020   ID:  Richard [name], DOB October 30, 1965, MRN [id number]  PCP:  Richard Blackbird, MD  Cardiologist: Dr. Johnsie [name]  No chief complaint on file.     History of Present Illness: Richard [name] is a 54 y.o. male who presents for hospital follow-up, seen for Dr. Johnsie [name].   [name] has a history of hx of alcohol abuse and withdrawal, colon cancer status post surgery 2007 without follow-up, PUD, GERD, alcoholic pancreatitis, chronic abdominal pain, hypertension, SVT, chronic systolic heart failure (EF 30-35% in 11/2018)who was recently followed by cardiology during hospital course for heart failure and prolonged QTC.    Initial echocardiogram from 2020 with LVEF 30 to 35%, now with improvement to 60 to 65% on most recent hospitalization. Cardiomyopathy felt to be secondary to alcohol use and possibly tachycardia mediated with uncontrolled hypertension.  He was treated with Lasix 40 mg daily as well as spironolactone with plans for close follow-up.  He did undergo a Lexiscan stress test which showed a reversible inferior lateral defect however plan was to treat him medically given lack of symptoms and questionable compliance issues.    1. Chronic diastolic CHF - EF 99991111 on echo 11/2018, G1DD, no RWMA, mildly reduced RV function - Echo with improved EF 60-65% this admission - CM felt to be 2/2 to alcohol use and possibly tachymediated with uncontrolled HTN - On admission BNP 211. Hypotension has limited management - He had left sided chest pain and Lexiscan stress test showed reversible inferolateral defect - albumin low on admission. Suspected swelling is worse given low albumin - continue Lasix 40 mg daily and spiro mg daily - weights are not accurate. I/Os show net gain since admission  2. Alcoholic hepatitis - patient presented with abdominal pain/distention found to have leukocytosis, hyperbilirubinemia, coagulopathy and ascitics - He  is s/p paracentesis 4/7 with 3.1L fluid removed - management per GI/IM  3. Abnormal stress test - Lexiscan with reversible inferolateral defect - Plan was for cardiac cath ?today however INR is 2.1 with goal <1.6 - Will discuss plan with MD  4. Prolonged QT  - QTc 518 in the ED - thought to be 2/2 to metabolic derangement  5. HTN - hypotension this admission - BP today 103/62  6. Hypercoagulable state - INR<1.6 for cath today. - INR today is 2.1     Past Medical History:  Diagnosis Date  . Acid reflux   . Colon cancer ([place])   . ETOH abuse   . HTN (hypertension)   . Pancreatitis 08/2018  . PUD (peptic ulcer disease)     Past Surgical History:  Procedure Laterality Date  . BIOPSY  12/14/2019   Procedure: BIOPSY;  Surgeon: Mauri Pole, MD;  [facility]: [facility];  Service: Endoscopy;;  . COLONOSCOPY WITH PROPOFOL N/A 12/14/2019   Procedure: COLONOSCOPY WITH PROPOFOL;  Surgeon: Mauri Pole, MD;  [facility]: [facility];  Service: Endoscopy;  Laterality: N/A;  . ESOPHAGOGASTRODUODENOSCOPY (EGD) WITH PROPOFOL N/A 12/14/2019   Procedure: ESOPHAGOGASTRODUODENOSCOPY (EGD) WITH PROPOFOL;  Surgeon: Mauri Pole, MD;  [facility]: [facility];  Service: Endoscopy;  Laterality: N/A;  . LAPAROSCOPIC SIGMOID COLECTOMY  2007  . POLYPECTOMY  12/14/2019   Procedure: POLYPECTOMY;  Surgeon: Mauri Pole, MD;  [facility]: [facility];  Service: Endoscopy;;     Current Outpatient Medications  Medication Sig Dispense Refill  . aspirin EC 81 MG EC tablet Take 1 tablet (81 mg total) by  mouth daily. (Patient not taking: Reported on 01/22/2020) 30 tablet 2  . folic acid (FOLVITE) 1 MG tablet Take 1 tablet (1 mg total) by mouth daily. (Patient taking differently: Take 1 mg by mouth at bedtime. ) 30 tablet 0  . furosemide (LASIX) 40 MG tablet Take 1 tablet (40 mg total) by mouth daily. (Patient taking differently: Take 40 mg by mouth at bedtime. ) 30 tablet 2  .  lactulose (CHRONULAC) 10 GM/15ML solution Take 30 mLs (20 g total) by mouth 2 (two) times daily. (Patient taking differently: Take 20 g by mouth at bedtime. ) 1800 mL 2  . magnesium oxide (MAG-OX) 400 (241.3 Mg) MG tablet Take 1 tablet (400 mg total) by mouth daily. (Patient taking differently: Take 400 mg by mouth at bedtime. ) 100 tablet 0  . metoprolol succinate (TOPROL-XL) 25 MG 24 hr tablet Take 1 tablet (25 mg total) by mouth daily. (Patient taking differently: Take 25 mg by mouth at bedtime. ) 30 tablet 2  . Multiple Vitamin (MULTIVITAMIN WITH MINERALS) TABS tablet Take 1 tablet by mouth daily.    . nicotine (NICODERM CQ - DOSED IN MG/24 HOURS) 21 mg/24hr patch Place 1 patch (21 mg total) onto the skin daily. 28 patch 0  . pantoprazole (PROTONIX) 40 MG tablet Take 1 tablet (40 mg total) by mouth 2 (two) times daily before a meal. (Patient taking differently: Take 80 mg by mouth at bedtime. ) 60 tablet 0  . prednisoLONE 5 MG TABS tablet Take 8 tablets (40 mg total) by mouth daily. 168 tablet 0  . spironolactone (ALDACTONE) 100 MG tablet Take 1 tablet (100 mg total) by mouth daily. (Patient taking differently: Take 100 mg by mouth at bedtime. ) 30 tablet 2  . thiamine 100 MG tablet Take 1 tablet (100 mg total) by mouth daily. (Patient taking differently: Take 100 mg by mouth at bedtime. ) 30 tablet 0   No current facility-administered medications for this visit.    Allergies:   Aspirin and Penicillins    Social History:  The patient  reports that he has been smoking. He has been smoking about 1.00 pack per day. He has never used smokeless tobacco. He reports current alcohol use of about 2.0 standard drinks of alcohol per week. He reports current drug use. Drug: Marijuana.   Family History:  The patient's ***family history includes Cancer in his sister; Colon cancer in his father.    ROS:  Please see the history of present illness.   Otherwise, review of systems are positive for {NONE  DEFAULTED:18576::"none"}.   All other systems are reviewed and negative.    PHYSICAL EXAM: VS:  There were no vitals taken for this visit. , BMI There is no height or weight on file to calculate BMI. GEN: Well nourished, well developed, in no acute distress HEENT: normal Neck: no JVD, carotid bruits, or masses Cardiac: ***RRR; no murmurs, rubs, or gallops,no edema  Respiratory:  clear to auscultation bilaterally, normal work of breathing GI: soft, nontender, nondistended, + BS MS: no deformity or atrophy Skin: warm and dry, no rash Neuro:  Strength and sensation are intact Psych: euthymic mood, full affect   EKG:  EKG {ACTION; IS/IS [id number] ordered today. The ekg ordered today demonstrates ***   Recent Labs: 01/07/2020: B Natriuretic Peptide 211.4 01/16/2020: Magnesium 1.6 01/22/2020: ALT 78; BUN 16; Creatinine, Ser 0.78; Hemoglobin 10.0; Platelets 297; Potassium 3.2; Sodium 139    Lipid Panel    Component Value Date/Time  CHOL 134 01/11/2020 0640   TRIG 149 01/11/2020 0640   HDL <10 (L) 01/11/2020 0640   CHOLHDL NOT CALCULATED 01/11/2020 0640   VLDL 30 01/11/2020 0640   LDLCALC NOT CALCULATED 01/11/2020 0640      Wt Readings from Last 3 Encounters:  01/16/20 115 lb 1.3 oz (52.2 kg)  12/14/19 117 lb 4.6 oz (53.2 kg)  04/27/19 130 lb (59 kg)      Other studies Reviewed: Additional studies/ records that were reviewed today include: ***. Review of the above records demonstrates: ***  Echocardiogram 01/07/20: 1. Left ventricular ejection fraction, by estimation, is 60 to 65%. The  left ventricle has normal function. The left ventricle has no regional  wall motion abnormalities. Left ventricular diastolic parameters were  normal.  2. Right ventricular systolic function is normal. The right ventricular  size is normal.  3. The mitral valve is normal in structure. No evidence of mitral valve  regurgitation. No evidence of mitral stenosis.  4. The aortic valve  is normal in structure. Aortic valve regurgitation is  not visualized. No aortic stenosis is present.  5. The inferior vena cava is normal in size with greater than 50%  respiratory variability, suggesting right atrial pressure of 3 mmHg.  NST 01/09/2020:  There was no ST segment deviation noted during stress.  Defect 1: There is a medium defect of mild severity present in the basal inferolateral, mid inferolateral and apical lateral location.  This is an intermediate risk study.  The left ventricular ejection fraction is mildly decreased (45-54%).  Nuclear stress EF: 51%.  Intermediate risk study due to reversible inferolateral ischemia (moderate extent, mild severity) and mildly depressed left ventricular systolic function.  Echo 11/2018 1. The left ventricle has moderate-severely reduced systolic function of  99991111. The cavity size was normal. There is no increased left ventricular  wall thickness. Echo evidence of impaired diastolic relaxation Left  ventrical global hypokinesis without  regional wall motion abnormalities.  2. The right ventricle has mildly reduced systolic function. The cavity  was mildly enlarged. There is no increase in right ventricular wall  thickness.  3. The mitral valve is normal in structure. No evidence of mitral valve  stenosis. No significant mitral regurgitation.  4. The tricuspid valve is normal in structure.  5. The aortic valve is tricuspid. No aortic stenosis.  6. The aortic root and ascending aorta are normal in size and structure.  7. The inferior vena cava is normal in size with greater than 50%  respiratory variability.  8. No complete TR doppler jet so unable to estimate PA systolic pressure.  ASSESSMENT AND PLAN:  1.  ***   Current medicines are reviewed at length with the patient today.  The patient {ACTIONS; HAS/DOES NOT HAVE:19233} concerns regarding medicines.  The following changes have been made:  {PLAN; NO  CHANGE:13088:s}  Labs/ tests ordered today include: *** No orders of the defined types were placed in this encounter.    Disposition:   FU with *** in {gen number [id number] {Days to years:10300}  Signed, Kathyrn Drown, NP  01/24/2020 11:22 AM    [facility] [address], [place], [place]  [address] Phone: [phone]; Fax: [phone]

## 2020-01-29 ENCOUNTER — Ambulatory Visit: Payer: Self-pay | Admitting: Nurse Practitioner

## 2020-01-29 ENCOUNTER — Other Ambulatory Visit: Payer: Self-pay

## 2020-02-03 ENCOUNTER — Ambulatory Visit: Payer: Self-pay | Admitting: Cardiology

## 2020-02-05 ENCOUNTER — Ambulatory Visit: Payer: Self-pay | Admitting: Family Medicine

## 2020-02-05 ENCOUNTER — Other Ambulatory Visit (INDEPENDENT_AMBULATORY_CARE_PROVIDER_SITE_OTHER): Payer: Self-pay

## 2020-02-05 ENCOUNTER — Encounter: Payer: Self-pay | Admitting: Nurse Practitioner

## 2020-02-05 ENCOUNTER — Ambulatory Visit (INDEPENDENT_AMBULATORY_CARE_PROVIDER_SITE_OTHER): Payer: Self-pay | Admitting: Nurse Practitioner

## 2020-02-05 VITALS — BP 112/64 | HR 88 | Temp 98.3°F | Ht 72.0 in | Wt 114.0 lb

## 2020-02-05 DIAGNOSIS — K7010 Alcoholic hepatitis without ascites: Secondary | ICD-10-CM

## 2020-02-05 LAB — COMPREHENSIVE METABOLIC PANEL
ALT: 34 U/L (ref 0–53)
AST: 33 U/L (ref 0–37)
Albumin: 3.4 g/dL — ABNORMAL LOW (ref 3.5–5.2)
Alkaline Phosphatase: 155 U/L — ABNORMAL HIGH (ref 39–117)
BUN: 15 mg/dL (ref 6–23)
CO2: 25 mEq/L (ref 19–32)
Calcium: 9.7 mg/dL (ref 8.4–10.5)
Chloride: 104 mEq/L (ref 96–112)
Creatinine, Ser: 0.56 mg/dL (ref 0.40–1.50)
GFR: 185.25 mL/min (ref >60.00)
Glucose, Bld: 93 mg/dL (ref 70–99)
Potassium: 3.8 mEq/L (ref 3.5–5.1)
Sodium: 136 mEq/L (ref 135–145)
Total Bilirubin: 2.7 mg/dL — ABNORMAL HIGH (ref 0.2–1.2)
Total Protein: 7.3 g/dL (ref 6.0–8.3)

## 2020-02-05 LAB — AMMONIA: Ammonia: 69 umol/L — ABNORMAL HIGH (ref 11–35)

## 2020-02-05 LAB — CBC
HCT: 29.8 % — ABNORMAL LOW (ref 39.0–52.0)
Hemoglobin: 9.8 g/dL — ABNORMAL LOW (ref 13.0–17.0)
MCHC: 32.9 g/dL (ref 30.0–36.0)
MCV: 90.6 fl (ref 78.0–100.0)
Platelets: 280 10*3/uL (ref 150.0–400.0)
RBC: 3.28 Mil/uL — ABNORMAL LOW (ref 4.22–5.81)
RDW: 17.2 % — ABNORMAL HIGH (ref 11.5–15.5)
WBC: 11.2 10*3/uL — ABNORMAL HIGH (ref 4.0–10.5)

## 2020-02-05 NOTE — Progress Notes (Signed)
IMPRESSION and PLAN:    54 year old male with PMH significant for alcohol abuse, history of alcoholic pancreatitis, dilated cardiomyopathy, GERD, PUD, hypertension.  He also has a remote history of colon cancer , status post resection  # Alcoholic hepatitis --Here for hospital follow-up.  Patient is falling asleep.  He has mild asterixis on exam.  He is unaccompanied by family . History comes from the chart --No evidence for cirrhosis on imaging --No evidence for portal hypertension except for ascites.  Also upper endoscopy in March of this year showed no esophageal or gastric varices --Endorses alcohol abstinence --On 02/08/2020 he will have completed a month of prednisolone 40 mg.  I will provide him with a tapering schedule to be carried out over approximately 16 days.  --Update labs.  Will obtain CBC, c-Met.  He had an INR on 01/22/2020 and it had improved to 1.5.  --Increase lactulose to 3 times daily. --Regarding diuretics, continue Lasix 40 mg daily and Aldactone 100 mg daily .  Hopefully he is taking them, patient does not know what he takes.  Niece prepares medications and she is not here.  --Follow-up with me later this month, sooner if needed.  I have asked that a family member attend the next visit  # GERD with history of esophagitis --Continue pantoprazole   HPI:    Primary GI: [name], MD  Chief complaint : Hospital follow-up  Patient is a 54 year old male known remotely to Dr. Henrene Pastor (2007) who diagnosed him with colon cancer.  Patient underwent colon resection.  He was subsequently followed by [facility] but now discharged from their practice.    05/02/18 - We met up with the patient again in July 2019 while he was hospitalized with alcoholic pancreatitis.  He had rapid improvement in symptoms, discharged home within a couple of days.  Plan was for hospital follow-up as well as a screening colonoscopy at some point time but he never returned to our office.    12/13/19 - we saw him in the hospital for evaluation of nausea, vomiting, hematemesis and suspected alcoholic hepatitis.  MDF below threshold for steroids.  Inpatient EGD 12/14/19 remarkable for reflux esophagitis, small hiatal hernia, congestive gastropathy.  He underwent colonoscopy 12/14/19 with findings of a few erosions in the sigmoid colon, in the ascending colon and at the ileocecal valve.  ICV biopsy showed benign mucosa with focal erosions.  No inflammation or high-grade dysplasia.  One 2 mm polyp in the ascending colon, path compatible with a tubular adenoma without high-grade dysplasia Patent functional end-to-end colo-colonic anastomosis, characterized by healthy appearing mucosa. Non-bleeding external and internal hemorrhoids  01/09/20 -we saw patient again in the hospital, this time for evaluation and treatment of alcoholic hepatitis.  Imaging negative for cirrhosis.  Contrasted CT scan showed no evidence for portal hypertension other than ascites.  MDF greater than 32 so prednisolone was initiated.  He had 2 LVPs, time less than 3 L.  No evidence for SBP.  On day 7 of prednisolone his Lille score was borderline. 0.459.  He was maintained on steroids a couple more days.  His Lille was recalculated with favorable results.  He was discharged home on a low-sodium diet, continuation of prednisolone 40 mg daily, instructions to avoid alcohol.  He is here for follow-up  HISTORY SINCE LAST CONTACT:  Yasir is here alone.  He is extremely sleepy.  Says niece is in the waiting room.  I tried to find her but  could not.  Found out towards the end of our meeting that she was actually waiting in the car.  Patient answers most questions appropriately, he does have mild asterixis on exam.  Patient says he lives with his brother.  Last alcoholic beverage was 2.5 months ago.  He has no complaints today.  Patient does not know what medications he is taking, niece prepares those for him.  He is not having a bowel  movement every day on his daily lactulose.  He denies abdominal pain.  Review of systems:     No chest pain, no SOB, no fevers, no urinary sx   Past Medical History:  Diagnosis Date  . Acid reflux   . Colon cancer ([place])   . ETOH abuse   . HTN (hypertension)   . Pancreatitis 08/2018  . PUD (peptic ulcer disease)     Patient's surgical history, family medical history, social history, medications and allergies were all reviewed in Epic   Serum creatinine: 0.78 mg/dL 01/22/20 1659 Estimated creatinine clearance: 79 mL/min  Current Outpatient Medications  Medication Sig Dispense Refill  . aspirin EC 81 MG EC tablet Take 1 tablet (81 mg total) by mouth daily. (Patient not taking: Reported on 01/22/2020) 30 tablet 2  . folic acid (FOLVITE) 1 MG tablet Take 1 tablet (1 mg total) by mouth daily. (Patient taking differently: Take 1 mg by mouth at bedtime. ) 30 tablet 0  . furosemide (LASIX) 40 MG tablet Take 1 tablet (40 mg total) by mouth daily. (Patient taking differently: Take 40 mg by mouth at bedtime. ) 30 tablet 2  . lactulose (CHRONULAC) 10 GM/15ML solution Take 30 mLs (20 g total) by mouth 2 (two) times daily. (Patient taking differently: Take 20 g by mouth at bedtime. ) 1800 mL 2  . magnesium oxide (MAG-OX) 400 (241.3 Mg) MG tablet Take 1 tablet (400 mg total) by mouth daily. (Patient taking differently: Take 400 mg by mouth at bedtime. ) 100 tablet 0  . metoprolol succinate (TOPROL-XL) 25 MG 24 hr tablet Take 1 tablet (25 mg total) by mouth daily. (Patient taking differently: Take 25 mg by mouth at bedtime. ) 30 tablet 2  . Multiple Vitamin (MULTIVITAMIN WITH MINERALS) TABS tablet Take 1 tablet by mouth daily.    . nicotine (NICODERM CQ - DOSED IN MG/24 HOURS) 21 mg/24hr patch Place 1 patch (21 mg total) onto the skin daily. 28 patch 0  . pantoprazole (PROTONIX) 40 MG tablet Take 1 tablet (40 mg total) by mouth 2 (two) times daily before a meal. (Patient taking differently: Take 80 mg by  mouth at bedtime. ) 60 tablet 0  . prednisoLONE 5 MG TABS tablet Take 8 tablets (40 mg total) by mouth daily. 168 tablet 0  . spironolactone (ALDACTONE) 100 MG tablet Take 1 tablet (100 mg total) by mouth daily. (Patient taking differently: Take 100 mg by mouth at bedtime. ) 30 tablet 2  . thiamine 100 MG tablet Take 1 tablet (100 mg total) by mouth daily. (Patient taking differently: Take 100 mg by mouth at bedtime. ) 30 tablet 0   No current facility-administered medications for this visit.    Physical Exam:     BP 112/64   Pulse 88   Temp 98.3 F (36.8 C)   Ht 6' (1.829 m)   Wt 114 lb (51.7 kg)   BMI 15.46 kg/m   GENERAL:  Pleasant male in NAD PSYCH: : Cooperative, falling asleep at intervals  CARDIAC:  RRR  PULM: Normal respiratory effort, lungs CTA bilaterally ABDOMEN: Mild to moderately distended , soft, nontender. normal bowel sounds SKIN:  turgor, no lesions seen NEURO: Sleepy, mild asterixis  Tye Savoy , NP 02/05/2020, 2:10 PM

## 2020-02-05 NOTE — Patient Instructions (Addendum)
If you are age 52 or older, your body mass index should be between 23-30. Your Body mass index is 15.46 kg/m. If this is out of the aforementioned range listed, please consider follow up with your Primary Care Provider.  If you are age 18 or younger, your body mass index should be between 19-25. Your Body mass index is 15.46 kg/m. If this is out of the aformentioned range listed, please consider follow up with your Primary Care Provider.   Prednisone reduction schedule: you should have 5 mg tablets at home.   On May 8 reduce the dose from 40 mg to 30 mg for four days Then 20 mg for four days Then 10 mg for four days Then 5 mg every other day for three dose cycles then stop  Avoid Alcohol  Increase Lactulose three times daily.  Your provider has requested that you go to the basement level for lab work before leaving today. Press "B" on the elevator. The lab is located at the first door on the left as you exit the elevator.  Return to clinic on 02/26/20 at 2 pm with a family member.  Thank you for choosing me and [facility].   Tye Savoy, NP

## 2020-02-06 ENCOUNTER — Encounter: Payer: Self-pay | Admitting: Nurse Practitioner

## 2020-02-11 NOTE — Progress Notes (Signed)
Assessment and plan reviewed 

## 2020-02-18 ENCOUNTER — Telehealth: Payer: Self-pay | Admitting: Nurse Practitioner

## 2020-02-18 MED FILL — ?METOPROLOL SUCC ER 25MG TA: 25 | 30 days supply | Qty: 30 | Fill #0

## 2020-02-18 MED FILL — ?FUROSEMIDE 4OMG TABLETS: 40 | 30 days supply | Qty: 30 | Fill #0

## 2020-02-18 MED FILL — ?SPIRONOLACTONE 100MG TAB: 100 | 30 days supply | Qty: 30 | Fill #0

## 2020-02-18 NOTE — Telephone Encounter (Signed)
Patient's mother calling to see if he can get refill on Prednisone medication he has ran out from when they gave it to him at the hospital

## 2020-02-19 NOTE — Telephone Encounter (Signed)
Should this be refilled for the patient?

## 2020-02-20 NOTE — Telephone Encounter (Signed)
I gave him a tapering schedule for this medication when I saw him in clinic. If he needs more pills to complete the tapering then okay to give more tablets. Thanks.

## 2020-02-20 NOTE — Telephone Encounter (Signed)
Left message with patient's wife to have return call to find out if he had completed taper as directed per last office visit.

## 2020-02-21 NOTE — Telephone Encounter (Signed)
Pt returning call

## 2020-02-24 NOTE — Telephone Encounter (Signed)
Tried to call patient, no answer, unable to leave a voicemail

## 2020-02-25 NOTE — Telephone Encounter (Signed)
No answer, unable to leave message. Patient is scheduled for an appointment with Tye Savoy, NP tomorrow at 2:00 pm

## 2020-02-26 ENCOUNTER — Ambulatory Visit: Payer: Self-pay | Admitting: Nurse Practitioner

## 2020-02-27 ENCOUNTER — Ambulatory Visit: Payer: Self-pay | Admitting: Family Medicine

## 2020-03-04 NOTE — Progress Notes (Signed)
Patient ID: Richard [name], male    DOB: 05-Apr-1966  MRN: [id number]  CC: Hospital Follow-Up  Subjective: Milfred Krammes is a 54 y.o. male with history of hypertension, supraventricular tachycardia, alcoholic cardiomyopathy, dilated cardiomyopathy, chronic systolic congestive heart failure, septal infarction, aspiration pneumonia, alcohol withdrawal delirium, thrombocytopenia, sepsis, drug abuse, and spontaneous bacterial peritonitis who presents for hospital follow-up.  1. HOSPITAL FOLLOW-UP: Visit 01/16/2020 at the emergency department by Dr. Louanne Belton. During that encounter patient continued on diuretics and had multiple paracentesis. During that time it appeared his liver failure was likely due to alcohol abuse and possible heart failure. Patient was continued on Spironolactone and Lasix. Patient was scheduled to follow-up with cardiology and GI soon and appointments were already in place.  Visit 01/22/2020 with Dr. Ronnald Nian at the [facility] Emergency Department. During that encounter. Patient presented with bilateral leg swelling and abdominal pain. Patient had overall unremarkable vitals and no fever. During the same visit patient reported continued swelling of legs and denied shortness of breath. Complained of occasional abdominal pain. A bedside ultrasound showed no large amount of ascites. No abdominal tenderness on exam and no concern for spontaneous bacterial peritonitis. Basic labs checked. No evidence of anemia, electrolyte abnormality, kidney injury. Chest x-ray unremarkable. Social worker arranged for patient to pick medications up from [facility]. Patient was encouraged to follow-up with cardiology and GI.  Visit 02/05/2020 patient had visit at [facility] with nurse practitioner Chester Holstein for alcoholic hepatitis. During this encounter patient was seen for hospital follow-up. Prednisone taper prescribed for 16 days; CBC and c-Met obtained;  Lactulose increased to 3 times daily; Lasix and Aldactone continued; and to follow-up with gastroenterology later that month.  ER FOLLOW UP: Time since discharge: 50 days Hospital/facility: [facility] Emergency Department Diagnosis: peripheral edema Procedures/tests: bedside ultrasound showed no large amount of ascites, BMP, CBC, PT/INR, CBG, chest x-ray Consultants: Dr. Ronnald Nian New medications: patient did not receive new prescriptions at the time of discharge Discharge instructions: Social worker arranged for patient to pick up prescription at [facility]. Plans to follow-up with cardiology and GI. Discharged in good condition. Status: fluctuating, today reports some days he does not have much swelling and other days its really bad and can only wear slides/sandals. Makes better: Sitting down makes better  Makes worse: Walking and activity  Today patient reports he has finished Prednisone prescribed by gastroenterology. Reports that he did not follow-up yet with gastroenterology since his last visit on 02/05/2020.   Today reports he has not seen cardiology as referred in April 2021 by Dr. Louanne Belton.  Today reports he is still taking Aldactone and Lasix as prescribed.  Reports he has abdominal pain which prevents sleep. Reports shortness of breath, chest pain, and cough occasionally.    Denies shortness of breath and chest pain during today's visit.    Denies fever, rash, sore throat, nausea, and vomiting.  Alcohol consumption: denies Smoking cigarettes: 0.5 pack daily Smoking marijuana: yes   Patient Active Problem List   Diagnosis Date Noted  . Abnormal nuclear cardiac imaging test   . Chest pain of uncertain etiology   . Alcoholic hepatitis 42/87/6811  . Hypokalemia 01/07/2020  . Leukocytosis 01/07/2020  . ETOH abuse 01/07/2020  . Alcoholic cardiomyopathy ([place]) 01/07/2020  . Drug abuse ([place]) 01/07/2020  . SBP (spontaneous bacterial  peritonitis) ([place]) 01/07/2020  . Tobacco abuse 01/07/2020  . Septal infarction ([place]) 01/07/2020  . Prolonged QT interval   .  Prolonged Q-T interval on ECG   . DCM (dilated cardiomyopathy) ([place])   . Chronic systolic CHF (congestive heart failure) ([place])   . Polyp of ascending colon   . History of colon cancer   . Ascites 12/13/2019  . SVT (supraventricular tachycardia) ([place])   . Hypomagnesemia   . Hypophosphatemia   . High anion gap metabolic acidosis 41/93/7902  . Acute pancreatitis 08/14/2018  . Smoker 08/14/2018  . 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 HTN (hypertension) 08/14/2018  . Abdominal pain 06/27/2018  . Hypertensive urgency 06/27/2018  . Elevated troponin 06/27/2018  . Hypoglycemia 06/27/2018  . Protein-calorie malnutrition, severe 05/02/2018  . Pancreatitis 05/01/2018  . Heme positive stool 11/13/2017  . Alcoholic ketoacidosis 40/97/3532  . Hepatic steatosis 08/21/2016  . Thrombocytopenia ([place]) 08/21/2016  . Alcohol abuse 08/21/2016  . Alcohol withdrawal delirium ([place]) 08/20/2016  . Dehydration 08/20/2016  . Intractable nausea and vomiting 08/20/2016  . Lactic acidosis 08/20/2016  . Aspiration pneumonia ([place]) 08/20/2016  . Sepsis ([place]) 08/20/2016     Current Outpatient Medications on File Prior to Visit  Medication Sig Dispense Refill  . aspirin EC 81 MG EC tablet Take 1 tablet (81 mg total) by mouth daily. (Patient not taking: Reported on 01/22/2020) 30 tablet 2  . folic acid (FOLVITE) 1 MG tablet Take 1 tablet (1 mg total) by mouth daily. (Patient taking differently: Take 1 mg by mouth at bedtime. ) 30 tablet 0  . furosemide (LASIX) 40 MG tablet Take 1 tablet (40 mg total) by mouth daily. (Patient taking differently: Take 40 mg by mouth at bedtime. ) 30 tablet 2  . lactulose (CHRONULAC) 10 GM/15ML solution Take 30 mLs (20 g total) by mouth 2 (two) times daily. (Patient taking differently: Take 20 g by mouth at bedtime. ) 1800 mL 2  . magnesium oxide (MAG-OX) 400 (241.3 Mg)  MG tablet Take 1 tablet (400 mg total) by mouth daily. (Patient taking differently: Take 400 mg by mouth at bedtime. ) 100 tablet 0  . metoprolol succinate (TOPROL-XL) 25 MG 24 hr tablet Take 1 tablet (25 mg total) by mouth daily. (Patient taking differently: Take 25 mg by mouth at bedtime. ) 30 tablet 2  . Multiple Vitamin (MULTIVITAMIN WITH MINERALS) TABS tablet Take 1 tablet by mouth daily.    . nicotine (NICODERM CQ - DOSED IN MG/24 HOURS) 21 mg/24hr patch Place 1 patch (21 mg total) onto the skin daily. 28 patch 0  . pantoprazole (PROTONIX) 40 MG tablet Take 1 tablet (40 mg total) by mouth 2 (two) times daily before a meal. (Patient taking differently: Take 80 mg by mouth at bedtime. ) 60 tablet 0  . prednisoLONE 5 MG TABS tablet Take 8 tablets (40 mg total) by mouth daily. 168 tablet 0  . spironolactone (ALDACTONE) 100 MG tablet Take 1 tablet (100 mg total) by mouth daily. (Patient taking differently: Take 100 mg by mouth at bedtime. ) 30 tablet 2  . thiamine 100 MG tablet Take 1 tablet (100 mg total) by mouth daily. (Patient taking differently: Take 100 mg by mouth at bedtime. ) 30 tablet 0   No current facility-administered medications on file prior to visit.    Allergies  Allergen Reactions  . Aspirin Other (See Comments)    Acid reflux   . Penicillins Hives    Has patient had a PCN reaction causing immediate rash, facial/tongue/throat swelling, SOB or lightheadedness with hypotension: yes Has patient had a PCN reaction causing severe rash involving mucus membranes or skin  necrosis: no Has patient had a PCN reaction that required hospitalization: no Has patient had a PCN reaction occurring within the last 10 years: no If all of the above answers are "NO", then may proceed with Cephalosporin use.     Social History   Socioeconomic History  . Marital status: Single    Spouse name: Not on file  . Number of children: Not on file  . Years of education: Not on file  . Highest  education level: Not on file  Occupational History  . Not on file  Tobacco Use  . Smoking status: Current Every Day Smoker    Packs/day: 1.00  . Smokeless tobacco: Never Used  Substance and Sexual Activity  . Alcohol use: Yes    Alcohol/week: 2.0 standard drinks    Types: 1 Cans of beer, 1 Shots of liquor per week    Comment: heavy alcohol abuse a beer and couple of shots a day for past  14 years  . Drug use: Yes    Types: Marijuana  . Sexual activity: Not Currently  Other Topics Concern  . Not on file  Social History Narrative  . Not on file   Social Determinants of Health   Financial Resource Strain:   . Difficulty of Paying Living Expenses:   Food Insecurity:   . Worried About Charity fundraiser in the Last Year:   . Arboriculturist in the Last Year:   Transportation Needs:   . Film/video editor (Medical):   Marland Kitchen Lack of Transportation (Non-Medical):   Physical Activity:   . Days of Exercise per Week:   . Minutes of Exercise per Session:   Stress:   . Feeling of Stress :   Social Connections:   . Frequency of Communication with Friends and Family:   . Frequency of Social Gatherings with Friends and Family:   . Attends Religious Services:   . Active Member of Clubs or Organizations:   . Attends Archivist Meetings:   Marland Kitchen Marital Status:   Intimate Partner Violence:   . Fear of Current or Ex-Partner:   . Emotionally Abused:   Marland Kitchen Physically Abused:   . Sexually Abused:     Family History  Problem Relation Age of Onset  . Colon cancer Father   . Cancer Sister   . CAD Neg Hx   . Stroke Neg Hx   . Diabetes Neg Hx     Past Surgical History:  Procedure Laterality Date  . BIOPSY  12/14/2019   Procedure: BIOPSY;  Surgeon: Mauri Pole, MD;  Location: [facility];  Service: Endoscopy;;  . COLONOSCOPY WITH PROPOFOL N/A 12/14/2019   Procedure: COLONOSCOPY WITH PROPOFOL;  Surgeon: Mauri Pole, MD;  Location: [facility];  Service:  Endoscopy;  Laterality: N/A;  . ESOPHAGOGASTRODUODENOSCOPY (EGD) WITH PROPOFOL N/A 12/14/2019   Procedure: ESOPHAGOGASTRODUODENOSCOPY (EGD) WITH PROPOFOL;  Surgeon: Mauri Pole, MD;  Location: [facility];  Service: Endoscopy;  Laterality: N/A;  . LAPAROSCOPIC SIGMOID COLECTOMY  2007  . POLYPECTOMY  12/14/2019   Procedure: POLYPECTOMY;  Surgeon: Mauri Pole, MD;  Location: [facility];  Service: Endoscopy;;    ROS: Review of Systems Negative except as stated above  PHYSICAL EXAM: Vitals with BMI 03/05/2020 02/05/2020 01/22/2020  Height - '6\' 0"'  -  Weight 122 lbs 114 lbs -  BMI - 10.17 -  Systolic 510 258 527  Diastolic 69 64 71  Pulse 59 88 65  SpO2- 99%, room air  Temperature- 96.65F, oral   Physical Exam General appearance - alert, well appearing, and in no distress and oriented to person, place, and time Mental status - alert, oriented to person, place, and time Neck - supple, no significant adenopathy Lymphatics - no palpable lymphadenopathy, no hepatosplenomegaly Chest - clear to auscultation, no wheezes, rales or rhonchi, symmetric air entry, no tachypnea, retractions or cyanosis Heart - normal rate, regular rhythm, normal S1, S2, no murmurs, rubs, clicks or gallops Abdomen - soft, nondistended, no masses or organomegaly, tenderness noted bilateral lower abdomen, bowel sounds hypoactive Back exam - full range of motion, no tenderness, palpable spasm or pain on motion Neurological - alert, oriented, normal speech, no focal findings or movement disorder noted, neck supple without rigidity, cranial nerves II through XII intact, DTR's normal and symmetric, motor and sensory grossly normal bilaterally, normal muscle tone, no tremors, strength 5/5, Romberg sign negative, normal gait and station Musculoskeletal - no joint tenderness, deformity or swelling, no muscular tenderness noted Extremities - peripheral pulses normal, no pedal edema, no clubbing or cyanosis Skin -  normal coloration and turgor, no rashes, no suspicious skin lesions noted, dry skin  ASSESSMENT AND PLAN: 1. Hospital discharge follow-up: -Patient discharged from hospital 4/21/2021after being seen for peripheral edema of bilateral lower legs and abdominal pain. -During that encounter all labs and imaging were unremarkable and patient was discharged home in stable condition to follow-up with PCP. -Today patient is in stable condition and not in distress without shortness of breath and chest pain.  -Today patient reports overall bilateral leg edema comes and goes with some days being worse than others.  -Counseled patient to continue diuretics as prescribed. -Counseled patient to follow-up with gastroenterology as scheduled.  -Referring patient to cardiology as advised on 01/16/2020 while admitted to the emergency department as patient did not make it to referral appointment. -Follow-up with attending physician as needed.  2. Alcoholic hepatitis with ascites: -Continue Furosemide and Spironolactone as prescribed.  -Follow-up with gastroenterology as scheduled.  - furosemide (LASIX) 40 MG tablet; Take 1 tablet (40 mg total) by mouth daily.  Dispense: 30 tablet; Refill: 2 - spironolactone (ALDACTONE) 100 MG tablet; Take 1 tablet (100 mg total) by mouth daily.  Dispense: 30 tablet; Refill: 2  3. Acute metabolic encephalopathy: -Continue Lactulose as prescribed.  -Follow-up with gastroenterology as scheduled.  - lactulose (CHRONULAC) 10 GM/15ML solution; Take 30 mLs (20 g total) by mouth 3 (three) times daily.  Dispense: 1800 mL; Refill: 2  4. Alcoholic cardiomyopathy ([place]): -Continued Aspirin and Metoprolol succinate as prescribed.  -Referral to cardiology as advised on 01/06/2020. - aspirin 81 MG EC tablet; Take 1 tablet (81 mg total) by mouth daily.  Dispense: 30 tablet; Refill: 2 - metoprolol succinate (TOPROL-XL) 25 MG 24 hr tablet; Take 1 tablet (25 mg total) by mouth daily.  Dispense: 30  tablet; Refill: 2 - Ambulatory referral to Cardiology  5. Acute on chronic systolic (congestive) heart failure ([place]): -Continued Aspirin and Metoprolol succinate as prescribed.  -Referral to cardiology as advised on 01/06/2020. - aspirin 81 MG EC tablet; Take 1 tablet (81 mg total) by mouth daily.  Dispense: 30 tablet; Refill: 2 - metoprolol succinate (TOPROL-XL) 25 MG 24 hr tablet; Take 1 tablet (25 mg total) by mouth daily.  Dispense: 30 tablet; Refill: 2 - Ambulatory referral to Cardiology  6. Prolonged QT interval: -Continued Aspirin and Metoprolol succinate as prescribed.  -Referral to cardiology as advised on 01/06/2020. - Ambulatory referral to Cardiology  7. Alcohol  abuse, in remission: -Patient reports he is no longer consuming alcohol.  8. Tobacco use: -Patient reports he smokes marijuana occasionally.  -Counseled to quit.  Discussed health risk associated with smoking and cessation methods including NRT, Chantix, Buproprion.   9. Marijuana use: -Patient reports he smokes marijuana occasionally.  -Counseled to quit.  Discussed health risk associated with smoking and cessation methods including NRT, Chantix, Buproprion.   Patient was given the opportunity to ask questions.  Patient verbalized understanding of the plan and was able to repeat key elements of the plan. Patient was given clear instructions to go to Emergency Department or return to medical center if symptoms don't improve, worsen, or new problems develop.The patient verbalized understanding.   Camillia Herter, NP

## 2020-03-05 ENCOUNTER — Encounter: Payer: Self-pay | Admitting: Family

## 2020-03-05 ENCOUNTER — Ambulatory Visit: Payer: Self-pay | Attending: Family Medicine | Admitting: Family

## 2020-03-05 ENCOUNTER — Other Ambulatory Visit: Payer: Self-pay

## 2020-03-05 VITALS — BP 104/69 | HR 59 | Temp 96.8°F | Resp 16 | Wt 122.0 lb

## 2020-03-05 DIAGNOSIS — G9341 Metabolic encephalopathy: Secondary | ICD-10-CM

## 2020-03-05 DIAGNOSIS — Z720 Tobacco use: Secondary | ICD-10-CM

## 2020-03-05 DIAGNOSIS — I5023 Acute on chronic systolic (congestive) heart failure: Secondary | ICD-10-CM

## 2020-03-05 DIAGNOSIS — F1011 Alcohol abuse, in remission: Secondary | ICD-10-CM

## 2020-03-05 DIAGNOSIS — Z09 Encounter for follow-up examination after completed treatment for conditions other than malignant neoplasm: Secondary | ICD-10-CM

## 2020-03-05 DIAGNOSIS — K7011 Alcoholic hepatitis with ascites: Secondary | ICD-10-CM

## 2020-03-05 DIAGNOSIS — I426 Alcoholic cardiomyopathy: Secondary | ICD-10-CM

## 2020-03-05 DIAGNOSIS — F1290 Cannabis use, unspecified, uncomplicated: Secondary | ICD-10-CM

## 2020-03-05 DIAGNOSIS — R9431 Abnormal electrocardiogram [ECG] [EKG]: Secondary | ICD-10-CM

## 2020-03-05 MED ORDER — METOPROLOL SUCCINATE ER 25 MG PO TB24: 25.0000 mg | ORAL_TABLET | Freq: Every day | ORAL | 2 refills | Status: DC

## 2020-03-05 MED ORDER — MAGNESIUM OXIDE 400 (241.3 MG) MG PO TABS: 400.0000 mg | ORAL_TABLET | Freq: Every day | ORAL | 0 refills | Status: DC

## 2020-03-05 MED ORDER — LACTULOSE 10 GM/15ML PO SOLN: 20.0000 g | Freq: Three times a day (TID) | ORAL | 2 refills | Status: DC

## 2020-03-05 MED ORDER — FUROSEMIDE 40 MG PO TABS: 40.0000 mg | ORAL_TABLET | Freq: Every day | ORAL | 2 refills | Status: DC

## 2020-03-05 MED ORDER — ASPIRIN 81 MG PO TBEC: 81.0000 mg | DELAYED_RELEASE_TABLET | Freq: Every day | ORAL | 2 refills | Status: DC

## 2020-03-05 MED ORDER — SPIRONOLACTONE 100 MG PO TABS: 100.0000 mg | ORAL_TABLET | Freq: Every day | ORAL | 2 refills | Status: DC

## 2020-03-05 NOTE — Patient Instructions (Addendum)
Continue aspirin, Lasix, Aldactone, Magnesium, and Metoprolol. Referral to cardiology. Keep appointments with gastroenterology. Follow-up with primary physician as needed. Peripheral Edema  Peripheral edema is swelling that is caused by a buildup of fluid. Peripheral edema most often affects the lower legs, ankles, and feet. It can also develop in the arms, hands, and face. The area of the body that has peripheral edema will look swollen. It may also feel heavy or warm. Your clothes may start to feel tight. Pressing on the area may make a temporary dent in your skin. You may not be able to move your swollen arm or leg as much as usual. There are many causes of peripheral edema. It can happen because of a complication of other conditions such as congestive heart failure, kidney disease, or a problem with your blood circulation. It also can be a side effect of certain medicines or because of an infection. It often happens to women during pregnancy. Sometimes, the cause is not known. Follow these instructions at home: Managing pain, stiffness, and swelling   Raise (elevate) your legs while you are sitting or lying down.  Move around often to prevent stiffness and to lessen swelling.  Do not sit or stand for long periods of time.  Wear support stockings as told by your health care provider. Medicines  Take over-the-counter and prescription medicines only as told by your health care provider.  Your health care provider may prescribe medicine to help your body get rid of excess water (diuretic). General instructions  Pay attention to any changes in your symptoms.  Follow instructions from your health care provider about limiting salt (sodium) in your diet. Sometimes, eating less salt may reduce swelling.  Moisturize skin daily to help prevent skin from cracking and draining.  Keep all follow-up visits as told by your health care provider. This is important. Contact a health care provider if  you have:  A fever.  Edema that starts suddenly or is getting worse, especially if you are pregnant or have a medical condition.  Swelling in only one leg.  Increased swelling, redness, or pain in one or both of your legs.  Drainage or sores at the area where you have edema. Get help right away if you:  Develop shortness of breath, especially when you are lying down.  Have pain in your chest or abdomen.  Feel weak.  Feel faint. Summary  Peripheral edema is swelling that is caused by a buildup of fluid. Peripheral edema most often affects the lower legs, ankles, and feet.  Move around often to prevent stiffness and to lessen swelling. Do not sit or stand for long periods of time.  Pay attention to any changes in your symptoms.  Contact a health care provider if you have edema that starts suddenly or is getting worse, especially if you are pregnant or have a medical condition.  Get help right away if you develop shortness of breath, especially when lying down. This information is not intended to replace advice given to you by your health care provider. Make sure you discuss any questions you have with your health care provider. Document Revised: 06/13/2018 Document Reviewed: 06/13/2018 Elsevier Patient Education  [place].

## 2020-03-06 MED FILL — LACTULOSE 10 GM/15 ML SOLN: 10 | 20 days supply | Qty: 1800 | Fill #0

## 2020-03-06 MED FILL — MAGNESIUM OXIDE 400 MG TAB: 400 | 30 days supply | Qty: 30 | Fill #0

## 2020-03-10 ENCOUNTER — Telehealth: Payer: Self-pay | Admitting: Nurse Practitioner

## 2020-03-10 NOTE — Telephone Encounter (Signed)
I reviewed the patient's chart and have been contacted by the pharmacy as well, he is under the impression that someone is calling in an antibiotic for him. He No Showed his last appointment to be seen at the office, however had a recent appointment with his PCP. The pharmacy said that he seemed to be really confused when he came in.

## 2020-03-11 NOTE — Telephone Encounter (Signed)
Beth, can you please check on this.  I have not sent any prescriptions in for this patient so I am not clear what is going on.  However, it sounds like he may be confused.  Please call the patient make sure he is taking his lactulose as prescribed.  I think he missed an appointment with me he should get an appointment.  Thank you

## 2020-03-11 NOTE — Telephone Encounter (Signed)
Richard [name], I spoke with the patient. I think he is able to think at his base line, but I don't think he has a clear understanding of his liver disease. He had a visit with his PCP. He thinks he is missing a medication but he is unable to tell me what he has or what the missing medication is. I think it may have been prednisolone. He refers to a piece of cardboard that had the pills and he took 8 of them.  As for follow up with you, that will be weeks out on 7/8/2. I have put him on that date.

## 2020-03-12 NOTE — Telephone Encounter (Signed)
Richard [name], he should have completed the prednisolone taper as it was only for 16 days. This is NOT a medication that he was supposed to stay on. We were treating etoh hepatitis. He needs a follow up appt. thanks

## 2020-03-13 ENCOUNTER — Telehealth: Payer: Self-pay | Admitting: Cardiovascular Disease

## 2020-03-13 NOTE — Telephone Encounter (Signed)
Ok, thanks.

## 2020-03-13 NOTE — Telephone Encounter (Signed)
Patient added on Lori Gerhardt's schedule for 03/16/20 at 9:45am.

## 2020-03-13 NOTE — Telephone Encounter (Addendum)
Richard [name] I have advised the patient of this. He seems to grasp this and voices his understanding. He is a patient of Dr Henrene Pastor. I have made a follow up appointment with you in July. Patient is aware. Dr Henrene Pastor is scheduling into late August.

## 2020-03-16 ENCOUNTER — Encounter: Payer: Self-pay | Admitting: Nurse Practitioner

## 2020-03-16 ENCOUNTER — Other Ambulatory Visit: Payer: Self-pay | Admitting: Nurse Practitioner

## 2020-03-16 ENCOUNTER — Ambulatory Visit (INDEPENDENT_AMBULATORY_CARE_PROVIDER_SITE_OTHER): Payer: Self-pay | Admitting: Nurse Practitioner

## 2020-03-16 ENCOUNTER — Other Ambulatory Visit: Payer: Self-pay

## 2020-03-16 VITALS — BP 100/60 | HR 69 | Ht 72.0 in | Wt 118.4 lb

## 2020-03-16 DIAGNOSIS — R9431 Abnormal electrocardiogram [ECG] [EKG]: Secondary | ICD-10-CM

## 2020-03-16 DIAGNOSIS — I5022 Chronic systolic (congestive) heart failure: Secondary | ICD-10-CM

## 2020-03-16 DIAGNOSIS — I428 Other cardiomyopathies: Secondary | ICD-10-CM

## 2020-03-16 DIAGNOSIS — K7011 Alcoholic hepatitis with ascites: Secondary | ICD-10-CM

## 2020-03-16 LAB — COMPREHENSIVE METABOLIC PANEL
ALT: 8 IU/L (ref 0–44)
AST: 20 IU/L (ref 0–40)
Albumin/Globulin Ratio: 1 — ABNORMAL LOW (ref 1.2–2.2)
Albumin: 3.5 g/dL — ABNORMAL LOW (ref 3.8–4.9)
Alkaline Phosphatase: 129 IU/L — ABNORMAL HIGH (ref 48–121)
BUN/Creatinine Ratio: 9 (ref 9–20)
BUN: 8 mg/dL (ref 6–24)
Bilirubin Total: 1.2 mg/dL (ref 0.0–1.2)
CO2: 20 mmol/L (ref 20–29)
Calcium: 9.7 mg/dL (ref 8.7–10.2)
Chloride: 101 mmol/L (ref 96–106)
Creatinine, Ser: 0.85 mg/dL (ref 0.76–1.27)
GFR calc Af Amer: 116 mL/min/{1.73_m2} (ref >59)
GFR calc non Af Amer: 100 mL/min/{1.73_m2} (ref >59)
Globulin, Total: 3.5 g/dL (ref 1.5–4.5)
Glucose: 77 mg/dL (ref 65–99)
Potassium: 4.5 mmol/L (ref 3.5–5.2)
Sodium: 134 mmol/L (ref 134–144)
Total Protein: 7 g/dL (ref 6.0–8.5)

## 2020-03-16 LAB — CBC
Hematocrit: 27 % — ABNORMAL LOW (ref 37.5–51.0)
Hemoglobin: 9.2 g/dL — ABNORMAL LOW (ref 13.0–17.7)
MCH: 27.4 pg (ref 26.6–33.0)
MCHC: 34.1 g/dL (ref 31.5–35.7)
MCV: 80 fL (ref 79–97)
Platelets: 224 10*3/uL (ref 150–450)
RBC: 3.36 x10E6/uL — ABNORMAL LOW (ref 4.14–5.80)
RDW: 15.4 % (ref 11.6–15.4)
WBC: 5.9 10*3/uL (ref 3.4–10.8)

## 2020-03-16 LAB — MAGNESIUM: Magnesium: 1.8 mg/dL (ref 1.6–2.3)

## 2020-03-16 MED ORDER — FUROSEMIDE 40 MG PO TABS
40.0000 mg | ORAL_TABLET | Freq: Every day | ORAL | 3 refills | Status: DC
Start: 2020-03-16 — End: 2020-04-09

## 2020-03-16 MED ORDER — SPIRONOLACTONE 100 MG PO TABS: 100.0000 mg | ORAL_TABLET | Freq: Every day | ORAL | 6 refills | Status: DC

## 2020-03-16 MED FILL — ?SPIRONOLACTONE 100MG TAB: 100 | 30 days supply | Qty: 30 | Fill #0

## 2020-03-16 MED FILL — ?FUROSEMIDE 4OMG TABLETS: 40 | 30 days supply | Qty: 30 | Fill #0

## 2020-03-16 NOTE — Patient Instructions (Addendum)
After Visit Summary:  We will be checking the following labs today - CMET & CBC   Medication Instructions:    Continue with your current medicines.   I have sent in refills for the Lasix, Spironolactone and the Metoprolol today to [facility].    If you need a refill on your cardiac medications before your next appointment, please call your pharmacy.     Testing/Procedures To Be Arranged:  N/A  Follow-Up:   See me in about 6 weeks.   See Dr. Johnsie Cancel in 3 months    At [facility], you and your health needs are our priority.  As part of our continuing mission to provide you with exceptional heart care, we have created designated Provider Care Teams.  These Care Teams include your primary Cardiologist (physician) and Advanced Practice Providers (APPs -  Physician Assistants and Nurse Practitioners) who all work together to provide you with the care you need, when you need it.  Special Instructions:  . Stay safe, stay home, wash your hands for at least 20 seconds and wear a mask when out in public.     Call the [place] office at [phone] if you have any questions, problems or concerns.

## 2020-03-16 NOTE — Progress Notes (Signed)
CARDIOLOGY OFFICE NOTE  Date:  03/16/2020    Richard [name] Date of Birth: 1966-05-24 Medical Record #[id number]  PCP:  Antony Blackbird, MD  Cardiologist:  Johnsie Cancel (NEW)  Chief Complaint  Patient presents with  . Follow-up    Seen for Dr. Johnsie Cancel  . Hospitalization Follow-up    History of Present Illness: Richard [name] is a 54 y.o. male who presents today for a post hospital visit. Seen for Dr. Johnsie Cancel (NEW).  He has a history of alcohol abuse and withdrawal, colon cancer status post surgery 2007 without follow-up, PUD, GERD, alcoholic pancreatitis, chronic abdominal pain, hypertension, SVT, and chronic systolic heart failure (EF 30-35% in 11/2018).  Had seen Dr. Johnsie Cancel in February of 2020 while in the hospital with SVT in the setting of alcohol withdrawal. 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  Did not keep out patient follow up at that time.   Readmitted back in April of 2021 with heart failure and prolonged QT in the setting of abdominal pain/chest pain. UDS positive for THC. Noted to have ascites. Quite hypokalemic. Required paracentesis - seeing GI - on Lactulose. Looked to have been on coumadin as well. Myoview was intermediate - felt to best manage medically and NOT pursue cardiac catheterization.  The patient does not have symptoms concerning for COVID-19 infection (fever, chills, cough, or new shortness of breath).   Comes in today. Here with his niece but she left to run an errand. His history is very hard to follow - he seems to ramble. He says he remains off alcohol. Smokes about 12 cigarettes a day. Denies THC use at this time. He feels weak and tired.  He has multiple bottles of medicines that are empty but a pill box that looks like it has been filled for this week. He has an OTC bottle and a RX bottle for Mag ox. He says he is eating. Fatigues easily. Previously worked in [employer] - trying to get Medicaid. No current  palpitations noted. He notes significant itching. He tells me his ability to read is poor.   Past Medical History:  Diagnosis Date  . Acid reflux   . Colon cancer ([place])   . ETOH abuse   . HTN (hypertension)   . Pancreatitis 08/2018  . PUD (peptic ulcer disease)     Past Surgical History:  Procedure Laterality Date  . BIOPSY  12/14/2019   Procedure: BIOPSY;  Surgeon: Mauri Pole, MD;  Location: [facility];  Service: Endoscopy;;  . COLONOSCOPY WITH PROPOFOL N/A 12/14/2019   Procedure: COLONOSCOPY WITH PROPOFOL;  Surgeon: Mauri Pole, MD;  Location: [facility];  Service: Endoscopy;  Laterality: N/A;  . ESOPHAGOGASTRODUODENOSCOPY (EGD) WITH PROPOFOL N/A 12/14/2019   Procedure: ESOPHAGOGASTRODUODENOSCOPY (EGD) WITH PROPOFOL;  Surgeon: Mauri Pole, MD;  Location: [facility];  Service: Endoscopy;  Laterality: N/A;  . LAPAROSCOPIC SIGMOID COLECTOMY  2007  . POLYPECTOMY  12/14/2019   Procedure: POLYPECTOMY;  Surgeon: Mauri Pole, MD;  Location: [facility];  Service: Endoscopy;;     Medications: Current Meds  Medication Sig  . aspirin 81 MG EC tablet Take 1 tablet (81 mg total) by mouth daily.  Marland Kitchen lactulose (CHRONULAC) 10 GM/15ML solution Take 30 mLs (20 g total) by mouth 3 (three) times daily.  . magnesium oxide (MAG-OX) 400 (241.3 Mg) MG tablet Take 1 tablet (400 mg total) by mouth daily.  . metoprolol succinate (TOPROL-XL) 25 MG 24 hr  tablet Take 1 tablet (25 mg total) by mouth daily.  Marland Kitchen spironolactone (ALDACTONE) 100 MG tablet Take 1 tablet (100 mg total) by mouth daily.  . [DISCONTINUED] spironolactone (ALDACTONE) 100 MG tablet Take 1 tablet (100 mg total) by mouth daily.     Allergies: Allergies  Allergen Reactions  . Aspirin Other (See Comments)    Acid reflux   . Penicillins Hives    Has patient had a PCN reaction causing immediate rash, facial/tongue/throat swelling, SOB or lightheadedness with hypotension: yes Has patient had a PCN  reaction causing severe rash involving mucus membranes or skin necrosis: no Has patient had a PCN reaction that required hospitalization: no Has patient had a PCN reaction occurring within the last 10 years: no If all of the above answers are "NO", then may proceed with Cephalosporin use.     Social History: The patient  reports that he has been smoking. He has been smoking about 1.00 pack per day. He has never used smokeless tobacco. He reports current alcohol use of about 2.0 standard drinks of alcohol per week. He reports current drug use. Drug: Marijuana.   Family History: The patient's family history includes Cancer in his sister; Colon cancer in his father.   Review of Systems: Please see the history of present illness.   All other systems are reviewed and negative.   Physical Exam: VS:  BP 100/60   Pulse 69   Ht 6' (1.829 m)   Wt 118 lb 6.4 oz (53.7 kg)   SpO2 99%   BMI 16.06 kg/m  .  BMI Body mass index is 16.06 kg/m.  Wt Readings from Last 3 Encounters:  03/16/20 118 lb 6.4 oz (53.7 kg)  03/05/20 122 lb (55.3 kg)  02/05/20 114 lb (51.7 kg)    General: Quite thin. Emaciated. Looks older than his stated age. His thoughts see very tangential to me. He is in no acute distress.   Cardiac: Regular rate and rhythm. Soft S3. No edema today.  Respiratory:  Lungs with decreased breath sounds in the bases - but normal work of breathing.  GI: Distended but soft - seems tender to palpitate.  MS: No deformity or atrophy. Gait and ROM intact. Using a cane - appears weak.  Skin: Warm and dry. Color is normal.  Neuro:  Strength and sensation are intact and no gross focal deficits noted.  Psych: Alert, appropriate and with normal affect.   LABORATORY DATA:  EKG:  EKG is ordered today.  Personally reviewed by me. This demonstrates sinus rhythm. QT is 404 ms today.  Lab Results  Component Value Date   WBC 11.2 (H) 02/05/2020   HGB 9.8 (L) 02/05/2020   HCT 29.8 (L) 02/05/2020    PLT 280.0 02/05/2020   GLUCOSE 93 02/05/2020   CHOL 134 01/11/2020   TRIG 149 01/11/2020   HDL <10 (L) 01/11/2020   LDLCALC NOT CALCULATED 01/11/2020   ALT 34 02/05/2020   AST 33 02/05/2020   NA 136 02/05/2020   K 3.8 02/05/2020   CL 104 02/05/2020   CREATININE 0.56 02/05/2020   BUN 15 02/05/2020   CO2 25 02/05/2020   TSH 1.334 08/21/2016   INR 1.5 (H) 01/22/2020   HGBA1C <4.2 (L) 01/11/2020     BNP (last 3 results) Recent Labs    01/07/20 1345  BNP 211.4*   Lab Results  Component Value Date   INR 1.5 (H) 01/22/2020   INR 1.8 (H) 01/20/2020   INR 1.9 (H) 01/16/2020  ProBNP (last 3 results) No results for input(s): PROBNP in the last 8760 hours.   Other Studies Reviewed Today:  Myoview Feb 07, 2020 Narrative & Impression    There was no ST segment deviation noted during stress.  Defect 1: There is a medium defect of mild severity present in the basal inferolateral, mid inferolateral and apical lateral location.  This is an intermediate risk study.  The left ventricular ejection fraction is mildly decreased (45-54%).  Nuclear stress EF: 51%.   Intermediate risk study due to reversible inferolateral ischemia (moderate extent, mild severity) and mildly depressed left ventricular systolic function.    Echo 11/2018 1. The left ventricle has moderate-severely reduced systolic function of  06-26%. The cavity size was normal. There is no increased left ventricular  wall thickness. Echo evidence of impaired diastolic relaxation Left  ventrical global hypokinesis without  regional wall motion abnormalities.  2. The right ventricle has mildly reduced systolic function. The cavity  was mildly enlarged. There is no increase in right ventricular wall  thickness.  3. The mitral valve is normal in structure. No evidence of mitral valve  stenosis. No significant mitral regurgitation.  4. The tricuspid valve is normal in structure.  5. The aortic valve is tricuspid.  No aortic stenosis.  6. The aortic root and ascending aorta are normal in size and structure.  7. The inferior vena cava is normal in size with greater than 50%  respiratory variability.  8. No complete TR doppler jet so unable to estimate PA systolic pressure.     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 Has stopped alcohol.   2. Alcoholic hepatitis - per GI  3. Prior abnormal Myoview - noted that Dr. Johnsie Cancel did not think cath was warranted - chest pain has been aypical - he has ongoing compliance issues and ongoing tobacco abuse - it was felt to best manage him medically. I think this would be the most appropriate regimen at this time. I think his overall prognosis is quite tenuous at this time - his options may be very limited - may need to consider more of a palliative care approach going forward.   4. Prior coumadin therapy - I do not see that he is on this anymore.  5. Chronic systolic HF - EF is 30 to 35% - on little medicine due to low BP. I have sent his refills in - it does appear that someone is helping him with a pill box (?mom) - he is to show this to his family. May need to consider referral to Advanced HF team - just to try and get support in place - but his options seem quite limited.   6. Hypokalemia - rechecking lab today.   7. Tobacco abuse - he is not ready to stop.   8. Prior prolonged QT - this is improved - rechecking lab today. He has 2 bottles for Mag ox - unclear if he is taking both.   9. Auto anticoagulation due to likely cirrhosis.   10.  COVID-19 Education: The signs and symptoms of COVID-19 were discussed with the patient and how to seek care for testing (follow up with PCP or arrange E-visit).  The importance of social distancing, staying at home, hand hygiene and wearing a mask when out in public were discussed today.  Current medicines are reviewed with the patient today.  The  patient does not have  concerns regarding medicines other than what has been noted above.  The following changes have been made:  See above.  Labs/ tests ordered today include:    Orders Placed This Encounter  Procedures  . Comprehensive metabolic panel  . CBC  . EKG 12-Lead     Disposition:   FU with [place] in about 6 weeks.    Patient is agreeable to this plan and will call if any problems develop in the interim.   SignedTruitt Merle, NP  03/16/2020 10:52 AM  [facility] [address] [place], [place]  [address] Phone: [phone] Fax: [phone]

## 2020-04-09 ENCOUNTER — Other Ambulatory Visit (INDEPENDENT_AMBULATORY_CARE_PROVIDER_SITE_OTHER): Payer: Self-pay

## 2020-04-09 ENCOUNTER — Telehealth: Payer: Self-pay | Admitting: Cardiovascular Disease

## 2020-04-09 ENCOUNTER — Other Ambulatory Visit: Payer: Self-pay | Admitting: Nurse Practitioner

## 2020-04-09 ENCOUNTER — Encounter: Payer: Self-pay | Admitting: Nurse Practitioner

## 2020-04-09 ENCOUNTER — Ambulatory Visit (INDEPENDENT_AMBULATORY_CARE_PROVIDER_SITE_OTHER): Payer: Self-pay | Admitting: Nurse Practitioner

## 2020-04-09 VITALS — BP 88/58 | HR 72 | Ht 70.0 in | Wt 114.5 lb

## 2020-04-09 DIAGNOSIS — K219 Gastro-esophageal reflux disease without esophagitis: Secondary | ICD-10-CM

## 2020-04-09 DIAGNOSIS — R252 Cramp and spasm: Secondary | ICD-10-CM

## 2020-04-09 DIAGNOSIS — K7011 Alcoholic hepatitis with ascites: Secondary | ICD-10-CM

## 2020-04-09 LAB — COMPREHENSIVE METABOLIC PANEL
ALT: 17 U/L (ref 0–53)
AST: 28 U/L (ref 0–37)
Albumin: 4.3 g/dL (ref 3.5–5.2)
Alkaline Phosphatase: 95 U/L (ref 39–117)
BUN: 11 mg/dL (ref 6–23)
CO2: 25 mEq/L (ref 19–32)
Calcium: 10 mg/dL (ref 8.4–10.5)
Chloride: 89 mEq/L — ABNORMAL LOW (ref 96–112)
Creatinine, Ser: 0.92 mg/dL (ref 0.40–1.50)
GFR: 104.39 mL/min (ref >60.00)
Glucose, Bld: 94 mg/dL (ref 70–99)
Potassium: 3.6 mEq/L (ref 3.5–5.1)
Sodium: 124 mEq/L — ABNORMAL LOW (ref 135–145)
Total Bilirubin: 1.7 mg/dL — ABNORMAL HIGH (ref 0.2–1.2)
Total Protein: 8.2 g/dL (ref 6.0–8.3)

## 2020-04-09 LAB — PROTIME-INR
INR: 1.5 ratio — ABNORMAL HIGH (ref 0.8–1.0)
Prothrombin Time: 17.1 s — ABNORMAL HIGH (ref 9.6–13.1)

## 2020-04-09 LAB — CBC
HCT: 32.3 % — ABNORMAL LOW (ref 39.0–52.0)
Hemoglobin: 10.8 g/dL — ABNORMAL LOW (ref 13.0–17.0)
MCHC: 33.4 g/dL (ref 30.0–36.0)
MCV: 80.6 fl (ref 78.0–100.0)
Platelets: 208 10*3/uL (ref 150.0–400.0)
RBC: 4 Mil/uL — ABNORMAL LOW (ref 4.22–5.81)
RDW: 19.6 % — ABNORMAL HIGH (ref 11.5–15.5)
WBC: 5.1 10*3/uL (ref 4.0–10.5)

## 2020-04-09 MED ORDER — PANTOPRAZOLE SODIUM 40 MG PO TBEC
40.0000 mg | DELAYED_RELEASE_TABLET | Freq: Every morning | ORAL | 3 refills | Status: DC
Start: 2020-04-09 — End: 2020-06-16

## 2020-04-09 NOTE — Patient Instructions (Addendum)
If you are age 52 or older, your body mass index should be between 23-30. Your Body mass index is 16.43 kg/m. If this is out of the aforementioned range listed, please consider follow up with your Primary Care Provider.  If you are age 20 or younger, your body mass index should be between 19-25. Your Body mass index is 16.43 kg/m. If this is out of the aformentioned range listed, please consider follow up with your Primary Care Provider.   Your provider has requested that you go to the basement level for lab work before leaving today. Press "B" on the elevator. The lab is located at the first door on the left as you exit the elevator.  STOP your Nexium. START Pantoprazole 40 mg 1 tablet every morning. This has been sent to your pharmacy.  Call your Cardiologist and ask if you are supposed to be taking Furosemide/Lasix and Spironolactone?  You are scheduled to follow up with Tye Savoy, NP-C on August 4 at 2 pm.

## 2020-04-09 NOTE — Telephone Encounter (Signed)
New message   *STAT* If patient is at the pharmacy, call can be transferred to refill team.   1. Which medications need to be refilled? (please list name of each medication and dose if known) spironolactone (ALDACTONE) 100 MG tablet  Furosemide (Lasix)  2. Which pharmacy/location (including street and city if local pharmacy) is medication to be sent to? [facility] [place]  3. Do they need a 30 day or 90 day supply? 90 day  Patient is out of medication. Needs tomorrow, out of medication.

## 2020-04-09 NOTE — Progress Notes (Signed)
IMPRESSION and PLAN:    54 year old male with PMH significant for alcohol abuse, history of alcoholic pancreatitis, dilated cardiomyopathy, GERD, PUD, hypertension.  He also has a remote history of colon cancer , status post resection, adenomatous colon polyps     # Etoh abuse , in remission for 4-5 months.    # Hospitalization in April 1093 for alcoholic hepatitis,  possible underlying cirrhosis --Etoh resolved. The question remains as to whether he has underlying cirrhosis.  --normal platelets and a contrasted CT scan  without evidence for portal HTN beyond ascites. Also upper endoscopy in March of this year showed no esophageal or gastric varices --Today he is alert, no asterixis and without evidence for ascites on exam. He is still on Lactulose but unclear whether taking laxis / aldactone.  He brings in a pill box full of medications but doesn't know what they are.   --In absence of ascites will hold off on diuretics if he is even taking them.  However, with hx of heart failure he may need to take the lasix / aldactone from Cardiology standpoint.   --Will update labs today.  -- I have given him written to call Cardiology about whether to contiue diuretics ( they advised taking then during April hospitalization)   --Continue Lactulose for now.  --Continue abstinence from ETOH  # Weight loss. He is down 8 pounds since early June.  --I hope he isn't overdiuresed --await labs.         # GERD with history of esophagitis --Gets heartburn everyday, even water burns.  --Takes Nexium OTC but cannot afford to take it everyday. Previously took daily Protonix . Marland Kitchen  --Will call in Rx for protonix 40 mg daily. Stop OTC Nexium  # Prolonged QT intervall   HPI:    Primary GI:  Richard Shorts, MD    Chief complaint :  Follow up  This patient is a 54 yo male who we saw in the hospital in 2019 for Etoh pancreatitis. We saw him again inpatient early March 2021 for  hematemesis / etoh abuse  and ascites. Insufficient ascites for paracentesis.  Inpatient EGD remarkable for congested gastropathy and esophagitis. We saw him a third time in the hospital early April 2021 for evaluation of Etoh hepatitis, leukocytosis, abdominal pain and ascites. Contrasted CT scan was negative for cirrhosis. Steroids initiated for Etoh hepatitis, diuretics started for ascites. He required 2 LVPs that admission, no evidence for SBP. That admission he was simultaneously being followed by Cardiology for heart failure / Etoh cardiomyopathy. A follow Echo that admission showed improvement in EF compared to the year prior.     Patient came for hospital follow up 5/5, he was falling asleep at the visit. Lactulose was increased to TID. I asked that a family member accompany him to the next office visit to help provide history. He was continued on diuretics and given tapering schedule for prednisolone. Patient was late to the follow up appt, unable to be seen and was rescheduled for today.   INTERVAL HISTORY:  Patient comes a lone to the visit again. He has a pill box filled with medications but doesn't know what the pills are. His mother fills the pill box for him. He has two empty pill bottles, one Magnesium oxide and the other Aldactone but he doesn't know if those meds are included in his pill box. He does take Lactulose  2-3 times a day. He takes OTC nexium when he can afford  it, wished he could take it everyday due to daily heartburn. For unclear reasons he is no longer taking Protonix. Metoprolol on home med list but has run out of the prescription.   PREVIOUS ENDOSCOPIC EVALUATIONS / GI studies: March 2021 EGD - congested gastropathy and esophagitis. Colonoscopy -A few erosions in the sigmoid colon, in the ascending colon and at the ileocecal valve. -One 2 mm polyp in the ascending colon, removed with a cold biopsy forceps. - Patent functional end-to-end colo-colonic anastomosis, characterized by healthy appearing  mucosa. - Non-bleeding external and internal hemorrhoids  Review of systems:     No chest pain, no SOB, no fevers, no urinary sx   Past Medical History:  Diagnosis Date  . Acid reflux   . Alcoholic hepatitis   . Colon cancer ([place])   . ETOH abuse   . HTN (hypertension)   . Pancreatitis 08/2018  . PUD (peptic ulcer disease)     Patient's surgical history, family medical history, social history, medications and allergies were all reviewed in Epic   Creatinine clearance cannot be calculated (Patient's most recent lab result is older than the maximum 21 days allowed.)  Current Outpatient Medications  Medication Sig Dispense Refill  . aspirin 81 MG EC tablet Take 1 tablet (81 mg total) by mouth daily. 30 tablet 2  . furosemide (LASIX) 40 MG tablet Take 1 tablet (40 mg total) by mouth daily. 90 tablet 3  . lactulose (CHRONULAC) 10 GM/15ML solution Take 30 mLs (20 g total) by mouth 3 (three) times daily. 1800 mL 2  . magnesium oxide (MAG-OX) 400 (241.3 Mg) MG tablet Take 1 tablet (400 mg total) by mouth daily. 100 tablet 0  . spironolactone (ALDACTONE) 100 MG tablet Take 1 tablet (100 mg total) by mouth daily. 30 tablet 6  . metoprolol succinate (TOPROL-XL) 25 MG 24 hr tablet Take 1 tablet (25 mg total) by mouth daily. (Patient not taking: Reported on 04/09/2020) 30 tablet 2   No current facility-administered medications for this visit.    Filed Weights   04/09/20 1338  Weight: 114 lb 8 oz (51.9 kg)    Physical Exam:     BP (!) 88/58 (BP Location: Left Arm, Patient Position: Sitting, Cuff Size: Normal)   Pulse 72   Ht 5\' 10"  (1.778 m) Comment: height measured without shoes  Wt 114 lb 8 oz (51.9 kg)   BMI 16.43 kg/m   GENERAL:  Pleasant thin male in NAD PSYCH: : Cooperative, normal affect CARDIAC:  RRR PULM: Normal respiratory effort, lungs CTA bilaterally, no wheezing ABDOMEN:  Nondistended, soft, nontender. No obvious masses, no hepatomegaly,  normal bowel sounds SKIN:   turgor, no lesions seen Musculoskeletal:  Normal muscle tone, normal strength NEURO: Alert and oriented x 3, no focal neurologic deficits. No asterixis  I spent 30 minutes total reviewing records, obtaining history, performing exam, counseling patient and documenting visit / findings.   Tye Savoy , NP 04/09/2020, 2:23 PM

## 2020-04-09 NOTE — Telephone Encounter (Signed)
Called pt and left message informing pt that his medication was already sent to his pharmacy as requested and that he needed to give his pharmacy a call and speak with a live person to inform them that his doctor sent his Rx on 03/16/20 with refills and if he has any other problems, questions or concerns, to give our office a call.

## 2020-04-10 MED FILL — ?PANTOPRAZOLE SO DR 40MG TA: 40 | 30 days supply | Qty: 30 | Fill #0

## 2020-04-10 NOTE — Progress Notes (Signed)
Noted  

## 2020-04-13 ENCOUNTER — Other Ambulatory Visit: Payer: Self-pay

## 2020-04-13 DIAGNOSIS — I5023 Acute on chronic systolic (congestive) heart failure: Secondary | ICD-10-CM

## 2020-04-13 DIAGNOSIS — I426 Alcoholic cardiomyopathy: Secondary | ICD-10-CM

## 2020-04-13 NOTE — Telephone Encounter (Signed)
Pt called asking for a refill on his "heart medication" and asked that it be sent to [facility] off [address]. Pt did not know which medication it was, I asked him if he meant metoprolol but he was not sure. He saw LG 03/16/20 and said she put in a refill for his medication and it should be there. It does show that she has written prescriptions for SPIRONOLACTONE but not metoprolol please advise.

## 2020-04-13 NOTE — Telephone Encounter (Signed)
S/w pt stated the only medication this office refills is spironolactone and pt has 6 refills left on this medication.  All other medications are filled at other offices.  There are refills on all bottles of medications. Pt is aware.

## 2020-04-14 ENCOUNTER — Ambulatory Visit: Payer: Self-pay | Attending: Nurse Practitioner | Admitting: Nurse Practitioner

## 2020-04-14 ENCOUNTER — Other Ambulatory Visit: Payer: Self-pay

## 2020-04-14 ENCOUNTER — Telehealth: Payer: Self-pay | Admitting: Family Medicine

## 2020-04-14 MED FILL — ?SPIRONOLACTONE 100MG TAB: 100 | 30 days supply | Qty: 30 | Fill #1

## 2020-04-14 MED FILL — ?FUROSEMIDE 4OMG TABLETS: 40 | 30 days supply | Qty: 30 | Fill #1

## 2020-04-14 MED FILL — ?METOPROLOL SUCC ER 25MG TA: 25 | 30 days supply | Qty: 30 | Fill #0

## 2020-04-14 MED FILL — MAGNESIUM OXIDE 400 MG TAB: 400 | 30 days supply | Qty: 30 | Fill #1

## 2020-04-14 MED FILL — LACTULOSE 10 GM/15 ML SOLN: 10 | 20 days supply | Qty: 1800 | Fill #1

## 2020-04-14 NOTE — Telephone Encounter (Signed)
Pt's medication was already sent to pt's pharmacy as requested. I called pt's pharmacy and the pharmacist stated that they did have this medication and that pt's medication was ready to be picked. I informed pt that his medication was ready. Pt verbalized understanding.

## 2020-04-14 NOTE — Telephone Encounter (Signed)
Patient called and requested for something to help with Nausea. Please follow up at your earliest convenience. [place]

## 2020-04-14 NOTE — Telephone Encounter (Signed)
Follow up  Pt is calling and says [facility] told the patient they do not have the medication for refill  Pt is upset because he doesn't understand why they wont refill his medication and he is out now   Please call

## 2020-04-20 ENCOUNTER — Telehealth: Payer: Self-pay | Admitting: Cardiovascular Disease

## 2020-04-20 ENCOUNTER — Telehealth: Payer: Self-pay

## 2020-04-20 NOTE — Telephone Encounter (Signed)
Called Beth back. Informed her that patient is only on the aldactone. Patient should not be taking Lasix. Beth thanked [place] for the call and will let the GI provider know.

## 2020-04-20 NOTE — Telephone Encounter (Signed)
Pt c/o medication issue:  1. Name of Medication: spironolactone (ALDACTONE) 100 MG tablet and lasix  2. How are you currently taking this medication (dosage and times per day)? n/a  3. Are you having a reaction (difficulty breathing--STAT)? no  4. What is your medication issue? Beth from [facility] is calling to see how Dr. Johnsie Cancel wants the patient to take his diuretic's. She states that the patient has lasix and spironlactone, does he need to take both or just one. Please advise.

## 2020-04-20 NOTE — Telephone Encounter (Signed)
Per my last note - his medicines are very unclear and he did not know what he was taking.  Did not appear that he was on Lasix - thus would stay off.   Would continue the Aldactone - lab is ok.   Would see back as planned.   Cecille Rubin

## 2020-04-20 NOTE — Telephone Encounter (Signed)
-----   Message from Willia Craze, NP sent at 04/14/2020 12:37 PM EDT ----- Not yet clear if has cirrhosis. Na and Cl- low, could be from diuretics but patient didn't know his meds when I saw him in clinic so couldn't say if taking aldactone and lasix.  Beth, please call patient and see if he called Cardiology like I asked him to. Do they want him on lasix and aldactone? He has a history of heart failure. For our purposes I don't think he needs diuretics and would stop them unless Cardiology wants him on them. Thanks

## 2020-04-20 NOTE — Telephone Encounter (Signed)
Will forward to Truitt Merle NP who saw the patient last.

## 2020-04-21 NOTE — Progress Notes (Deleted)
CARDIOLOGY OFFICE NOTE  Date:  04/21/2020    [name] Date of Birth: 1966-08-01 Medical Record #[id number]  PCP:  Antony Blackbird, MD  Cardiologist:  Servando Snare & ***    No chief complaint on file.   History of Present Illness: Richard [name] is a 54 y.o. male who presents today for a ***  Seen for Dr. Johnsie Cancel (NEW).  He has a history of alcohol abuse and withdrawal, colon cancer status post surgery 2007 without follow-up, PUD, GERD, alcoholic pancreatitis, chronic abdominal pain, hypertension, SVT, and chronic systolic heart failure (EF 30-35% in 11/2018).  Had seen Dr. Johnsie Cancel in February of 2020 while in the hospital with SVT in the setting of alcohol withdrawal. 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  Did not keep out patient follow up at that time.   Readmitted back in April of 2021 with heart failure and prolonged QT in the setting of abdominal pain/chest pain. UDS positive for THC. Noted to have ascites. Quite hypokalemic. Required paracentesis - seeing GI - on Lactulose. Looked to have been on coumadin as well. Myoview was intermediate - felt to best manage medically and NOT pursue cardiac catheterization.  The patient does not have symptoms concerning for COVID-19 infection (fever, chills, cough, or new shortness of breath).   Comes in today. Here with his niece but she left to run an errand. His history is very hard to follow - he seems to ramble. He says he remains off alcohol. Smokes about 12 cigarettes a day. Denies THC use at this time. He feels weak and tired.  He has multiple bottles of medicines that are empty but a pill box that looks like it has been filled for this week. He has an OTC bottle and a RX bottle for Mag ox. He says he is eating. Fatigues easily. Previously worked in [employer] - trying to get Medicaid. No current palpitations noted. He notes significant itching. He tells me his ability to read is poor.    Comes in today. Here with   Past Medical History:  Diagnosis Date  . Abnormal nuclear cardiac imaging test   . Acid reflux   . Acute pancreatitis 08/14/2018  . Alcohol withdrawal delirium ([place]) 08/20/2016  . Alcoholic cardiomyopathy ([place]) 01/07/2020  . Alcoholic hepatitis   . Alcoholic ketoacidosis 03/28/9484  . Ascites 12/13/2019  . Aspiration pneumonia ([place]) 08/20/2016  . Chest pain of uncertain etiology   . Chronic systolic CHF (congestive heart failure) ([place])   . Cirrhosis ([place])   . Colon cancer ([place])   . DCM (dilated cardiomyopathy) ([place])   . Drug abuse ([place]) 01/07/2020  . Elevated troponin 06/27/2018  . ETOH abuse   . 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 Heme positive stool 11/13/2017  . Hepatic steatosis 08/21/2016  . High anion gap metabolic acidosis 01/06/2702  . History of colon cancer   . HTN (hypertension)   . Hypertensive urgency 06/27/2018  . Hypoglycemia 06/27/2018  . Hypokalemia 01/07/2020  . Hypomagnesemia   . Hypophosphatemia   . Lactic acidosis 08/20/2016  . Leukocytosis 01/07/2020  . Pancreatitis 08/2018  . Polyp of ascending colon   . Prolonged Q-T interval on ECG   . Prolonged QT interval   . Protein-calorie malnutrition, severe 05/02/2018  . PUD (peptic ulcer disease)   . SBP (spontaneous bacterial peritonitis) ([place]) 01/07/2020  . Sepsis ([place]) 08/20/2016  . 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 SVT (supraventricular tachycardia) ([place])   .  Thrombocytopenia ([place]) 08/21/2016    Past Surgical History:  Procedure Laterality Date  . BIOPSY  12/14/2019   Procedure: BIOPSY;  Surgeon: Mauri Pole, MD;  Location: [facility];  Service: Endoscopy;;  . COLONOSCOPY WITH PROPOFOL N/A 12/14/2019   Procedure: COLONOSCOPY WITH PROPOFOL;  Surgeon: Mauri Pole, MD;  Location: [facility];  Service: Endoscopy;  Laterality: N/A;  . ESOPHAGOGASTRODUODENOSCOPY (EGD) WITH PROPOFOL N/A 12/14/2019   Procedure: ESOPHAGOGASTRODUODENOSCOPY (EGD) WITH PROPOFOL;  Surgeon: Mauri Pole, MD;  Location: [facility];  Service: Endoscopy;  Laterality: N/A;  . LAPAROSCOPIC SIGMOID COLECTOMY  2007  . POLYPECTOMY  12/14/2019   Procedure: POLYPECTOMY;  Surgeon: Mauri Pole, MD;  Location: [facility];  Service: Endoscopy;;     Medications: No outpatient medications have been marked as taking for the 04/29/20 encounter (Appointment) with Burtis Junes, NP.     Allergies: Allergies  Allergen Reactions  . Aspirin Other (See Comments)    Acid reflux   . Penicillins Hives    Has patient had a PCN reaction causing immediate rash, facial/tongue/throat swelling, SOB or lightheadedness with hypotension: yes Has patient had a PCN reaction causing severe rash involving mucus membranes or skin necrosis: no Has patient had a PCN reaction that required hospitalization: no Has patient had a PCN reaction occurring within the last 10 years: no If all of the above answers are "NO", then may proceed with Cephalosporin use.     Social History: The patient  reports that he has been smoking. He has been smoking about 1.00 pack per day. He has never used smokeless tobacco. He reports current alcohol use of about 2.0 standard drinks of alcohol per week. He reports current drug use. Drug: Marijuana.   Family History: The patient's ***family history includes Cancer in his sister; Colon cancer in his cousin and father; Kidney disease in his sister.   Review of Systems: Please see the history of present illness.   All other systems are reviewed and negative.   Physical Exam: VS:  There were no vitals taken for this visit. Marland Kitchen  BMI There is no height or weight on file to calculate BMI.  Wt Readings from Last 3 Encounters:  04/09/20 114 lb 8 oz (51.9 kg)  03/16/20 118 lb 6.4 oz (53.7 kg)  03/05/20 122 lb (55.3 kg)    General: Pleasant. Well developed, well nourished and in no acute distress.   HEENT: Normal.  Neck: Supple, no JVD, carotid bruits, or masses noted.    Cardiac: ***Regular rate and rhythm. No murmurs, rubs, or gallops. No edema.  Respiratory:  Lungs are clear to auscultation bilaterally with normal work of breathing.  GI: Soft and nontender.  MS: No deformity or atrophy. Gait and ROM intact.  Skin: Warm and dry. Color is normal.  Neuro:  Strength and sensation are intact and no gross focal deficits noted.  Psych: Alert, appropriate and with normal affect.   LABORATORY DATA:  EKG:  EKG {ACTION; IS/IS IRJ:[id number]} ordered today.  Personally reviewed by me. This demonstrates ***.  Lab Results  Component Value Date   WBC 5.1 04/09/2020   HGB 10.8 (L) 04/09/2020   HCT 32.3 (L) 04/09/2020   PLT 208.0 04/09/2020   GLUCOSE 94 04/09/2020   CHOL 134 01/11/2020   TRIG 149 01/11/2020   HDL <10 (L) 01/11/2020   LDLCALC NOT CALCULATED 01/11/2020   ALT 17 04/09/2020   AST 28 04/09/2020   NA 124 (L) 04/09/2020   K 3.6 04/09/2020  CL 89 (L) 04/09/2020   CREATININE 0.92 04/09/2020   BUN 11 04/09/2020   CO2 25 04/09/2020   TSH 1.334 08/21/2016   INR 1.5 (H) 04/09/2020   HGBA1C <4.2 (L) 01/11/2020     BNP (last 3 results) Recent Labs    01/07/20 1345  BNP 211.4*    ProBNP (last 3 results) No results for input(s): PROBNP in the last 8760 hours.   Other Studies Reviewed Today:   Assessment/Plan:  Myoview 01/2020 Narrative & Impression    There was no ST segment deviation noted during stress.  Defect 1: There is a medium defect of mild severity present in the basal inferolateral, mid inferolateral and apical lateral location.  This is an intermediate risk study.  The left ventricular ejection fraction is mildly decreased (45-54%).  Nuclear stress EF: 51%.  Intermediate risk study due to reversible inferolateral ischemia (moderate extent, mild severity) and mildly depressed left ventricular systolic function.    Echo 11/2018 1. The left ventricle has moderate-severely reduced systolic function of  11-91%. The  cavity size was normal. There is no increased left ventricular  wall thickness. Echo evidence of impaired diastolic relaxation Left  ventrical global hypokinesis without  regional wall motion abnormalities.  2. The right ventricle has mildly reduced systolic function. The cavity  was mildly enlarged. There is no increase in right ventricular wall  thickness.  3. The mitral valve is normal in structure. No evidence of mitral valve  stenosis. No significant mitral regurgitation.  4. The tricuspid valve is normal in structure.  5. The aortic valve is tricuspid. No aortic stenosis.  6. The aortic root and ascending aorta are normal in size and structure.  7. The inferior vena cava is normal in size with greater than 50%  respiratory variability.  8. No complete TR doppler jet so unable to estimate PA systolic pressure.    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 Has stopped alcohol.   2. Alcoholic hepatitis - per GI  3. Prior abnormal Myoview - noted that Dr. Johnsie Cancel did not think cath was warranted - chest pain has been aypical - he has ongoing compliance issues and ongoing tobacco abuse - it was felt to best manage him medically. I think this would be the most appropriate regimen at this time. I think his overall prognosis is quite tenuous at this time - his options may be very limited - may need to consider more of a palliative care approach going forward.   4. Prior coumadin therapy - I do not see that he is on this anymore.  5. Chronic systolic HF - EF is 30 to 35% - on little medicine due to low BP. I have sent his refills in - it does appear that someone is helping him with a pill box (?mom) - he is to show this to his family. May need to consider referral to Advanced HF team - just to try and get support in place - but his options seem quite limited.   6. Hypokalemia - rechecking lab today.    7. Tobacco abuse - he is not ready to stop.   8. Prior prolonged QT - this is improved - rechecking lab today. He has 2 bottles for Mag ox - unclear if he is taking both.   9. Auto anticoagulation due to likely cirrhosis.    Current medicines are reviewed with the patient  today.  The patient does not have concerns regarding medicines other than what has been noted above.  The following changes have been made:  See above.  Labs/ tests ordered today include:   No orders of the defined types were placed in this encounter.    Disposition:   FU with *** in {gen number [id number]} {Days to years:10300}.   Patient is agreeable to this plan and will call if any problems develop in the interim.   SignedTruitt Merle, NP  04/21/2020 9:13 AM  [facility] [address] [place], [place]  [address] Phone: [phone] Fax: [phone]

## 2020-04-27 ENCOUNTER — Telehealth: Payer: Self-pay | Admitting: Nurse Practitioner

## 2020-04-27 NOTE — Telephone Encounter (Signed)
New Message  Pt is calling to reschedule appt with Truitt Merle. Next available is 08/31. Pt says that is too long and is wanting to be worked in sooner.   Please call

## 2020-04-28 ENCOUNTER — Other Ambulatory Visit: Payer: Self-pay | Admitting: Family Medicine

## 2020-04-28 DIAGNOSIS — I426 Alcoholic cardiomyopathy: Secondary | ICD-10-CM

## 2020-04-28 DIAGNOSIS — I5023 Acute on chronic systolic (congestive) heart failure: Secondary | ICD-10-CM

## 2020-04-28 DIAGNOSIS — G9341 Metabolic encephalopathy: Secondary | ICD-10-CM

## 2020-04-28 DIAGNOSIS — K7011 Alcoholic hepatitis with ascites: Secondary | ICD-10-CM

## 2020-04-28 NOTE — Telephone Encounter (Signed)
Medication Refill - Medication: aspirin 81 MG EC tablet [[id number]]  lactulose (CHRONULAC) 10 GM/15ML solution [[id number]]   magnesium oxide (MAG-OX) 400 (241.3 Mg) MG tablet [[id number]]  metoprolol succinate (TOPROL-XL) 25 MG 24 hr tablet [[id number]]  pantoprazole (PROTONIX) 40 MG tablet [[id number]]  spironolactone (ALDACTONE) 100 MG tablet [[id number]]   Preferred Pharmacy (with phone number or street name):  [facility], [address]  [place] [address]  Phone: [phone] Fax: [phone]    Agent: Please be advised that RX refills may take up to 3 business days. We ask that you follow-up with your pharmacy.

## 2020-04-28 NOTE — Telephone Encounter (Signed)
Call to pharmacy- patient has RF on all medications- some can be filled today/tomorrow/OTC- but some are too early. Attempted to call patient- call was disconnected when answered.

## 2020-04-29 ENCOUNTER — Ambulatory Visit: Payer: Self-pay | Admitting: Nurse Practitioner

## 2020-04-30 ENCOUNTER — Ambulatory Visit: Payer: Self-pay | Attending: Family | Admitting: Family

## 2020-04-30 ENCOUNTER — Other Ambulatory Visit: Payer: Self-pay

## 2020-04-30 DIAGNOSIS — R252 Cramp and spasm: Secondary | ICD-10-CM

## 2020-04-30 MED ORDER — METHOCARBAMOL 500 MG PO TABS: 500.0000 mg | ORAL_TABLET | Freq: Every evening | ORAL | 0 refills | Status: DC

## 2020-04-30 MED FILL — LACTULOSE 10 GM/15 ML SOLN: 10 | 15 days supply | Qty: 1419 | Fill #2

## 2020-04-30 NOTE — Progress Notes (Signed)
Virtual Visit via Telephone Note  I connected with Richard [name], on 04/30/2020 at 8:07 AM by telephone due to the COVID-19 pandemic and verified that I am speaking with the correct person using two identifiers.  Due to current restrictions/limitations of in-office visits due to the COVID-19 pandemic, this scheduled clinical appointment was converted to a telehealth visit.   Consent: I discussed the limitations, risks, security and privacy concerns of performing an evaluation and management service by telephone and the availability of in person appointments. I also discussed with the patient that there may be a patient responsible charge related to this service. The patient expressed understanding and agreed to proceed.  Location of Patient: Home  Location of Provider: [place] [facility] [place]  Persons participating in Telemedicine visit: Graham Hyun Lemmons [place], NP Or[place]av[place], [place]  History of Present Illness: Richard [name]. Bracamonte is a 54 year old male with history of hypertension, supraventricular tachycardia, alcoholic cardiomyopathy, chronic systolic congestive heart failure, aspiration pneumonia, pancreatitis, sepsis, thrombocytopenia, and drug abuse who presents for muscle cramping.  1. MUSCLE CRAMPING:  Reports muscle cramping of bilateral hands and bilateral legs. Reports every time he grabs something his hands cramp. Reports at nighttime bilateral legs tend to cramp worse. Denies taking any prescribed or over-the-counter medication for this. Reports the cramping last all day.   Past Medical History:  Diagnosis Date  . Abnormal nuclear cardiac imaging test   . Acid reflux   . Acute pancreatitis 08/14/2018  . Alcohol withdrawal delirium ([place]) 08/20/2016  . Alcoholic cardiomyopathy ([place]) 01/07/2020  . Alcoholic hepatitis   . Alcoholic ketoacidosis 03/23/3085  . Ascites 12/13/2019  . Aspiration pneumonia ([place]) 08/20/2016  . Chest pain of uncertain etiology    . Chronic systolic CHF (congestive heart failure) ([place])   . Cirrhosis ([place])   . Colon cancer ([place])   . DCM (dilated cardiomyopathy) ([place])   . Drug abuse ([place]) 01/07/2020  . Elevated troponin 06/27/2018  . ETOH abuse   . 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 Heme positive stool 11/13/2017  . Hepatic steatosis 08/21/2016  . High anion gap metabolic acidosis 02/07/8468  . History of colon cancer   . HTN (hypertension)   . Hypertensive urgency 06/27/2018  . Hypoglycemia 06/27/2018  . Hypokalemia 01/07/2020  . Hypomagnesemia   . Hypophosphatemia   . Lactic acidosis 08/20/2016  . Leukocytosis 01/07/2020  . Pancreatitis 08/2018  . Polyp of ascending colon   . Prolonged Q-T interval on ECG   . Prolonged QT interval   . Protein-calorie malnutrition, severe 05/02/2018  . PUD (peptic ulcer disease)   . SBP (spontaneous bacterial peritonitis) ([place]) 01/07/2020  . Sepsis ([place]) 08/20/2016  . Septal infarction ([place]) 01/07/2020  . SVT (supraventricular tachycardia) ([place])   . Thrombocytopenia ([place]) 08/21/2016   Allergies  Allergen Reactions  . Aspirin Other (See Comments)    Acid reflux   . Penicillins Hives    Has patient had a PCN reaction causing immediate rash, facial/tongue/throat swelling, SOB or lightheadedness with hypotension: yes Has patient had a PCN reaction causing severe rash involving mucus membranes or skin necrosis: no Has patient had a PCN reaction that required hospitalization: no Has patient had a PCN reaction occurring within the last 10 years: no If all of the above answers are "NO", then may proceed with Cephalosporin use.     Current Outpatient Medications on File Prior to Visit  Medication Sig Dispense Refill  . aspirin 81 MG EC tablet Take 1 tablet (81 mg total) by  mouth daily. 30 tablet 2  . lactulose (CHRONULAC) 10 GM/15ML solution Take 30 mLs (20 g total) by mouth 3 (three) times daily. 1800 mL 2  . magnesium oxide (MAG-OX) 400 (241.3 Mg) MG tablet Take 1 tablet (400 mg total) by  mouth daily. 100 tablet 0  . metoprolol succinate (TOPROL-XL) 25 MG 24 hr tablet Take 1 tablet (25 mg total) by mouth daily. (Patient not taking: Reported on 04/09/2020) 30 tablet 2  . pantoprazole (PROTONIX) 40 MG tablet Take 1 tablet (40 mg total) by mouth in the morning. 30 tablet 3  . spironolactone (ALDACTONE) 100 MG tablet Take 1 tablet (100 mg total) by mouth daily. 30 tablet 6   No current facility-administered medications on file prior to visit.    Observations/Objective: Alert and oriented x 3. Not in acute distress. Physical examination not completed as this is a telemedicine visit.  Assessment and Plan: 1. Muscle cramps: -Patient with bilateral hands and legs cramps. Reports leg cramps are worse during nighttime.  -Methocarbamol for muscle cramps. Muscle relaxants may cause drowsiness. Counseled patient to not consume if operating heavy machinery or driving. Counseled patient to not consume with alcohol. Patient verbalized understanding. -Patient also with history of hypomagnesemia which may be contributing to muscle cramps. Patient reports he is taking magnesium oxide as prescribed.  -Follow-up with primary physician as needed.  - methocarbamol (ROBAXIN) 500 MG tablet; Take 1 tablet (500 mg total) by mouth at bedtime.  Dispense: 30 tablet; Refill: 0   Follow Up Instructions: Follow-up with primary physician as needed.   Patient was given clear instructions to go to Emergency Department or return to medical center if symptoms don't improve, worsen, or new problems develop.The patient verbalized understanding.  I discussed the assessment and treatment plan with the patient. The patient was provided an opportunity to ask questions and all were answered. The patient agreed with the plan and demonstrated an understanding of the instructions.   The patient was advised to call back or seek an in-person evaluation if the symptoms worsen or if the condition fails to improve as  anticipated.   I provided 10 minutes total of non-face-to-face time during this encounter including median intraservice time, reviewing previous notes, labs, imaging, medications, management and patient verbalized understanding.    Camillia Herter, NP  [facility] [place], [place]   04/30/2020, 8:07 AM

## 2020-04-30 NOTE — Patient Instructions (Addendum)
Methocarbamol for muscle spasms. Follow-up with primary physician as needed. Muscle Cramps and Spasms Muscle cramps and spasms are when muscles tighten by themselves. They usually get better within minutes. Muscle cramps are painful. They are usually stronger and last longer than muscle spasms. Muscle spasms may or may not be painful. They can last a few seconds or much longer. Cramps and spasms can affect any muscle, but they occur most often in the calf muscles of the leg. They are usually not caused by a serious problem. In many cases, the cause is not known. Some common causes include:  Doing more physical work or exercise than your body is ready for.  Using the muscles too much (overuse) by repeating certain movements too many times.  Staying in a certain position for a long time.  Playing a sport or doing an activity without preparing properly.  Using bad form or technique while playing a sport or doing an activity.  Not having enough water in your body (dehydration).  Injury.  Side effects of some medicines.  Low levels of the salts and minerals in your blood (electrolytes), such as low potassium or calcium. Follow these instructions at home: Managing pain and stiffness      Massage, stretch, and relax the muscle. Do this for many minutes at a time.  If told, put heat on tight or tense muscles as often as told by your doctor. Use the heat source that your doctor recommends, such as a moist heat pack or a heating pad. ? Place a towel between your skin and the heat source. ? Leave the heat on for 20-30 minutes. ? Remove the heat if your skin turns bright red. This is very important if you are not able to feel pain, heat, or cold. You may have a greater risk of getting burned.  If told, put ice on the affected area. This may help if you are sore or have pain after a cramp or spasm. ? Put ice in a plastic bag. ? Place a towel between your skin and the bag. ? Leave the ice on  for 20 minutes, 2-3 times a day.  Try taking hot showers or baths to help relax tight muscles. Eating and drinking  Drink enough fluid to keep your pee (urine) pale yellow.  Eat a healthy diet to help ensure that your muscles work well. This should include: ? Fruits and vegetables. ? Lean protein. ? Whole grains. ? Low-fat or nonfat dairy products. General instructions  If you are having cramps often, avoid intense exercise for several days.  Take over-the-counter and prescription medicines only as told by your doctor.  Watch for any changes in your symptoms.  Keep all follow-up visits as told by your doctor. This is important. Contact a doctor if:  Your cramps or spasms get worse or happen more often.  Your cramps or spasms do not get better with time. Summary  Muscle cramps and spasms are when muscles tighten by themselves. They usually get better within minutes.  Cramps and spasms occur most often in the calf muscles of the leg.  Massage, stretch, and relax the muscle. This may help the cramp or spasm go away.  Drink enough fluid to keep your pee (urine) pale yellow. This information is not intended to replace advice given to you by your health care provider. Make sure you discuss any questions you have with your health care provider. Document Revised: 02/12/2018 Document Reviewed: 02/12/2018 Elsevier Patient Education  2020 Elsevier  Inc.  

## 2020-05-01 MED FILL — METHOCARBAMOL 500 MG TABS: 500 | 30 days supply | Qty: 30 | Fill #0

## 2020-05-06 ENCOUNTER — Encounter: Payer: Self-pay | Admitting: Nurse Practitioner

## 2020-05-06 ENCOUNTER — Ambulatory Visit (INDEPENDENT_AMBULATORY_CARE_PROVIDER_SITE_OTHER): Payer: Self-pay | Admitting: Nurse Practitioner

## 2020-05-06 VITALS — BP 102/60 | HR 80 | Ht 71.0 in | Wt 114.1 lb

## 2020-05-06 DIAGNOSIS — K7682 Hepatic encephalopathy: Secondary | ICD-10-CM

## 2020-05-06 DIAGNOSIS — K7290 Hepatic failure, unspecified without coma: Secondary | ICD-10-CM

## 2020-05-06 DIAGNOSIS — K7010 Alcoholic hepatitis without ascites: Secondary | ICD-10-CM

## 2020-05-06 MED ORDER — LACTULOSE 10 GM/15ML PO SOLN: 20.0000 g | Freq: Two times a day (BID) | ORAL | 2 refills | Status: DC

## 2020-05-06 MED ORDER — MULTI VITAMIN PO TABS: 1.0000 | ORAL_TABLET | Freq: Every day | ORAL | Status: DC

## 2020-05-06 NOTE — Patient Instructions (Addendum)
If you are age 52 or older, your body mass index should be between 23-30. Your Body mass index is 15.92 kg/m. If this is out of the aforementioned range listed, please consider follow up with your Primary Care Provider.  If you are age 60 or younger, your body mass index should be between 19-25. Your Body mass index is 15.92 kg/m. If this is out of the aformentioned range listed, please consider follow up with your Primary Care Provider.   You have a follow up appointment with Dr. Henrene Pastor on Friday, 07-03-20 at 3:20pm.  Please arrive 10 to 15 minutes early.  Please take a daily multi-vitamin.  Take Lactulose twice a day.   Thank you for entrusting me with your care and for choosing [facility], Tye Savoy, NP-C

## 2020-05-06 NOTE — Progress Notes (Signed)
IMPRESSION and PLAN:    54 year old male with PMH significant for alcohol abuse, history of alcoholic pancreatitis, Alcoholic cardiomyopathy / chronic systolic heart failure, GERD, PUD, hypertension. He also has a remote history of colon cancer ,status post resection, adenomatous colon polyps  # Etoh hepatitis (April 2021), resolving / possible underlying cirrhosis.   --Not yet sure if has underlying cirrhosis but suspect he may based on hyponatremia, sarcopenia, persistently elevated INR of 1.5 and bilirubin of 1.7  --No gastric or esophageal varices on EGD in March 2021.  --No liver lesions on CT scan w/ contrast March 2021 -I'm continuing lactulose anyway as he is certainly more lucid today then before.  --No edema and little if any ascites on exam today. He remains on Aldactone ( apparently under direction of Cardiology). No need for diuretics right now from GI standpoint.  --Reiterated need for continued Etoh abstinence.  --Weight is stable but still low at 114 pounds.  Recommend daily multivitamin, continue with snacks in between meals.  --Follow up in October  #  GERD --burning improved on pantoprazole  # History of adenomatous colon polyps ( March 2021) and personal remote history of colon cancer, s/p resection --For 5-year polyp surveillance colonoscopy March 8182   # Alcoholic Cardiomyopathy --has follow up with Cardiology on 8/31.   HPI:    Primary GI: [name], MD    Chief complaint : follow up liver disease  This patient is a 54 yo male who we have been following for Etoh hepatitis. He is here for follow up from a few weeks ago.  Previously there has been confusion about his home medications and patient couldn't provide a good history and no one accompanied him to appointment. Today patient comes with his cousin who does not participate in his care at home. Again patient comes in without medication bottles, just a pill box. He didn't talk to Cardiology about  diuretics but says he is taking Aldactone 100 mg daily.   Ken hasn't had any Etoh since February. Taking Lactulose, two doses at bedtime despite prescription instructions to take it TID. Has anywhere from 1-6 loose BMs every morning. His appetite is good, eats two meals a day and snacks in between. Weight is low but stable at 114.    Data Reviewed:   04/09/20 Sodium 124, creatinine 0.92, alk phos 95, albumin 4.3, AST 28, ALT 17, total bilirubin 1.7, INR 1.5 Hemoglobin 10.8, platelets 208   PREVIOUS ENDOSCOPIC EVALUATIONS / GI studies:  Colonoscopy March 2021 A few erosions in the sigmoid colon, in the ascending colon and at the ileocecal valve.Biopsied. - One 2 mm polyp in the ascending colon, removed with a cold biopsy forceps. Resected and retrieved. - Patent functional end-to-end colo-colonic anastomosis, characterized by healthy appearingmucosa. - Non-bleeding external and internal hemorrhoids.  FINAL MICROSCOPIC DIAGNOSIS:   A. ILEOCEAL VALVE, BIOPSY:  - Benign colonic mucosa with focal erosions  - No active inflammation or evidence of microscopic colitis  - No high-grade dysplasia or malignancy identified   B. COLON, ASCENDING, POLYPECTOMY:  - Tubular adenoma (1 of 2 fragments)  - Benign colonic mucosa (1 of 2 fragments)  - No high grade dysplasia or malignancy identified    Review of systems:     No chest pain, no SOB, no fevers, no urinary sx   Past Medical History:  Diagnosis Date   Abnormal nuclear cardiac imaging test    Acid reflux    Acute pancreatitis 08/14/2018  Alcohol withdrawal delirium ([place]) 05/39/7673   Alcoholic cardiomyopathy ([place]) 01/01/9378   Alcoholic hepatitis    Alcoholic ketoacidosis 0/24/0973   Ascites 12/13/2019   Aspiration pneumonia ([place]) 08/20/2016   Chest pain of uncertain etiology    Chronic systolic CHF (congestive heart failure) (HCC)    Cirrhosis (HCC)    Colon cancer ([place])    DCM (dilated cardiomyopathy) ([place])     Drug abuse ([place]) 01/07/2020   Elevated troponin 06/27/2018   ETOH abuse    Gastropathy 08/14/2018   Heme positive stool 11/13/2017   Hepatic steatosis 08/21/2016   High anion gap metabolic acidosis 02/03/2991   History of colon cancer    HTN (hypertension)    Hypertensive urgency 06/27/2018   Hypoglycemia 06/27/2018   Hypokalemia 01/07/2020   Hypomagnesemia    Hypophosphatemia    Lactic acidosis 08/20/2016   Leukocytosis 01/07/2020   Pancreatitis 08/2018   Polyp of ascending colon    Prolonged Q-T interval on ECG    Prolonged QT interval    Protein-calorie malnutrition, severe 05/02/2018   PUD (peptic ulcer disease)    SBP (spontaneous bacterial peritonitis) ([place]) 01/07/2020   Sepsis ([place]) 08/20/2016   Septal infarction ([place]) 01/07/2020   SVT (supraventricular tachycardia) ([place])    Thrombocytopenia ([place]) 08/21/2016    Patient's surgical history, family medical history, social history, medications and allergies were all reviewed in Epic   Creatinine clearance cannot be calculated (Patient's most recent lab result is older than the maximum 21 days allowed.)  Current Outpatient Medications  Medication Sig Dispense Refill   aspirin 81 MG EC tablet Take 1 tablet (81 mg total) by mouth daily. 30 tablet 2   lactulose (CHRONULAC) 10 GM/15ML solution Take 30 mLs (20 g total) by mouth 3 (three) times daily. 1800 mL 2   magnesium oxide (MAG-OX) 400 (241.3 Mg) MG tablet Take 1 tablet (400 mg total) by mouth daily. 100 tablet 0   methocarbamol (ROBAXIN) 500 MG tablet Take 1 tablet (500 mg total) by mouth at bedtime. 30 tablet 0   metoprolol succinate (TOPROL-XL) 25 MG 24 hr tablet Take 1 tablet (25 mg total) by mouth daily. 30 tablet 2   pantoprazole (PROTONIX) 40 MG tablet Take 1 tablet (40 mg total) by mouth in the morning. 30 tablet 3   spironolactone (ALDACTONE) 100 MG tablet Take 1 tablet (100 mg total) by mouth daily. 30 tablet 6   No current  facility-administered medications for this visit.    Filed Weights   05/06/20 1353  Weight: 114 lb 2 oz (51.8 kg)    Physical Exam:     BP 102/60    Pulse 80    Ht _0  (1.803 m)    Wt 114 lb 2 oz (51.8 kg)    BMI 15.92 kg/m   GENERAL:  Pleasant thin male in NAD PSYCH: : Cooperative, normal affect CARDIAC:  RRR PULM: Normal respiratory effort, lungs CTA bilaterally, no wheezing ABDOMEN:  Nondistended, soft, nontender. No obvious masses, no hepatomegaly,  normal bowel sounds SKIN:  turgor, no lesions seen Musculoskeletal: Generalized muscle wasting .  NEURO: Alert and oriented x 3, no focal neurologic deficits.  No asterixis  I spent 35 minutes total reviewing records, obtaining history, performing exam, counseling patient and documenting visit / findings.   Tye Savoy , NP 05/06/2020, 2:11 PM

## 2020-05-07 NOTE — Progress Notes (Signed)
Assessment and plan reviewed 

## 2020-05-12 ENCOUNTER — Other Ambulatory Visit: Payer: Self-pay | Admitting: Family Medicine

## 2020-05-12 DIAGNOSIS — R252 Cramp and spasm: Secondary | ICD-10-CM

## 2020-05-12 NOTE — Telephone Encounter (Signed)
Copied from [place] [id number]. Topic: Quick Communication - Rx Refill/Question >> May 12, 2020  8:40 AM Leward Quan A wrote: Medication: methocarbamol (ROBAXIN) 500 MG tablet, magnesium oxide (MAG-OX) 400 (241.3 Mg) MG tablet, Multiple Vitamin (MULTI VITAMIN) TABS   Has the patient contacted their pharmacy? Yes.   (Agent: If no, request that the patient contact the pharmacy for the refill.) (Agent: If yes, when and what did the pharmacy advise?)  Preferred Pharmacy (with phone number or street name): [place], [place] [address]  Phone:  [phone] Fax:  [phone]     Agent: Please be advised that RX refills may take up to 3 business days. We ask that you follow-up with your pharmacy.

## 2020-05-12 NOTE — Telephone Encounter (Signed)
Requested Prescriptions  Pending Prescriptions Disp Refills   methocarbamol (ROBAXIN) 500 MG tablet 30 tablet     Sig: Take 1 tablet (500 mg total) by mouth at bedtime.     Not Delegated - Analgesics:  Muscle Relaxants Failed - 05/12/2020  8:49 AM      Failed - This refill cannot be delegated      Passed - Valid encounter within last 6 months    Recent Outpatient Visits          1 week ago Muscle cramps   [place], [place], NP   2 months ago Hospital discharge follow-up   [place], [place], NP   1 year ago Nausea   [place], MD   1 year ago Alcohol-induced chronic pancreatitis [facility])   [place] Fulp, [place], MD   1 year ago Essential hypertension   [place], MD      Future Appointments            In 3 weeks Burtis Junes, NP [facility], [facility]   In 1 month [place], Wallis Bamberg, MD [address] [facility], [facility]            magnesium oxide (MAG-OX) 400 (241.3 Mg) MG tablet 100 tablet     Sig: Take 1 tablet (400 mg total) by mouth daily.     Endocrinology:  Minerals - Magnesium Supplementation Passed - 05/12/2020  8:49 AM      Passed - Mg Level in normal range and within 360 days    Magnesium  Date Value Ref Range Status  03/16/2020 1.8 1.6 - 2.3 mg/dL Final         Passed - Valid encounter within last 12 months    Recent Outpatient Visits          1 week ago Muscle cramps   [place], [place], NP   2 months ago Hospital discharge follow-up   [place], [place], NP   1 year ago Nausea   [place], MD   1 year ago Alcohol-induced chronic pancreatitis [facility])   [place] Fulp,  [place], MD   1 year ago Essential hypertension   [place], MD      Future Appointments            In 3 weeks [place] Snare, Marlane Hatcher, NP [address] [facility], [facility]   In 1 month [place], Wallis Bamberg, MD [address] [facility], [facility]            Multiple Vitamin (MULTI VITAMIN) TABS 30 tablet     Sig: Take 1 tablet by mouth daily.     There is no refill protocol information for this order

## 2020-05-12 NOTE — Telephone Encounter (Signed)
Requested medication (s) are due for refill today: no  Requested medication (s) are on the active medication list: yes  Last refill:  04/30/20  Future visit scheduled: no  Notes to clinic:  NT not allowed to RF or refuse this med   Requested Prescriptions  Pending Prescriptions Disp Refills   methocarbamol (ROBAXIN) 500 MG tablet 30 tablet     Sig: Take 1 tablet (500 mg total) by mouth at bedtime.      Not Delegated - Analgesics:  Muscle Relaxants Failed - 05/12/2020  8:49 AM      Failed - This refill cannot be delegated      Passed - Valid encounter within last 6 months    Recent Outpatient Visits           1 week ago Muscle cramps   [place], [place], NP   2 months ago Hospital discharge follow-up   [place], [place], NP   1 year ago Nausea   [place], MD   1 year ago Alcohol-induced chronic pancreatitis [facility])   [place] Fulp, [place], MD   1 year ago Essential hypertension   [place], MD       Future Appointments             In 3 weeks Burt[place] Junes, NP [facility], [facility]   In 1 month [place], Wallis Bamberg, MD [address] [facility], [facility]             Refused Prescriptions Disp Refills   magnesium oxide (MAG-OX) 400 (241.3 Mg) MG tablet 100 tablet     Sig: Take 1 tablet (400 mg total) by mouth daily.      Endocrinology:  Minerals - Magnesium Supplementation Passed - 05/12/2020  8:49 AM      Passed - Mg Level in normal range and within 360 days    Magnesium  Date Value Ref Range Status  03/16/2020 1.8 1.6 - 2.3 mg/dL Final          Passed - Valid encounter within last 12 months    Recent Outpatient Visits           1 week ago Muscle cramps   [place], [place], NP   2  months ago Hospital discharge follow-up   [place], [place], NP   1 year ago Nausea   [place], MD   1 year ago Alcohol-induced chronic pancreatitis [facility])   [place] Fulp, [place], MD   1 year ago Essential hypertension   [place], MD       Future Appointments             In 3 weeks Serv[place] Snare, Marlane Hatcher, NP [address] [facility], [facility]   In 1 month [place], Wallis Bamberg, MD [address] [facility], [facility]              Multiple Vitamin (MULTI VITAMIN) TABS 30 tablet     Sig: Take 1 tablet by mouth daily.      There is no refill protocol information for this order

## 2020-05-12 NOTE — Telephone Encounter (Signed)
Copied from [place] [id number]. Topic: General - Other >> May 12, 2020  8:48 AM Leward Quan A wrote: Reason for CRM: Patient called to complain about not being able to get his medication states that he was not able to get it last time at the pharmacy due to him not having any money. He is not sure what to do in order to get this medication and I was not sure how to direct him please call Pt at Ph# [phone]

## 2020-05-13 MED ORDER — METHOCARBAMOL 500 MG PO TABS: 500.0000 mg | ORAL_TABLET | Freq: Every evening | ORAL | 0 refills | Status: DC

## 2020-05-14 ENCOUNTER — Telehealth: Payer: Self-pay | Admitting: Family Medicine

## 2020-05-14 MED FILL — MAGNESIUM OXIDE 400 MG TAB: 400 | 30 days supply | Qty: 30 | Fill #2

## 2020-05-14 MED FILL — ?METOPROLOL SUCC ER 25MG TA: 25 | 30 days supply | Qty: 30 | Fill #1

## 2020-05-14 NOTE — Telephone Encounter (Signed)
Pt is needing his medication . He is currently out and has called in a few times regarding this matter . Please reach out to pt

## 2020-05-14 NOTE — Telephone Encounter (Signed)
error:[id number] ° °

## 2020-05-15 NOTE — Telephone Encounter (Signed)
Called pt confirmed each medication on list has refills so he does not need to ask the doctor for more he simply needs to carry his bottles to the pharmacy to let them know all of the medications are ready to be refilled again & they will fill them. Pt verbalized understanding.

## 2020-05-20 ENCOUNTER — Ambulatory Visit: Payer: Self-pay | Admitting: Cardiovascular Disease

## 2020-05-21 MED FILL — ?FUROSEMIDE 4OMG TABLETS: 40 | 30 days supply | Qty: 30 | Fill #2

## 2020-05-21 MED FILL — ?SPIRONOLACTONE 100MG TAB: 100 | 30 days supply | Qty: 30 | Fill #2

## 2020-05-21 MED FILL — LACTULOSE 10 GM/15 ML SOLN: 10 | 20 days supply | Qty: 1800 | Fill #2

## 2020-05-21 MED FILL — PANTOPRAZOLE SOD DR 40 MG T: 40 | 30 days supply | Qty: 30 | Fill #1

## 2020-05-25 NOTE — Progress Notes (Signed)
CARDIOLOGY OFFICE NOTE  Date:  06/02/2020    Richard [name] Date of Birth: 11-15-1965 Medical Record #[id number]  PCP:  Antony Blackbird, MD  Cardiologist:  Gillian Shields (NEW)   Chief Complaint  Patient presents with  . Follow-up    Seen for Dr. Johnsie Cancel    History of Present Illness: Richard [name] is a 54 y.o. male who presents today for a follow up visit. Seen for Dr. Johnsie Cancel (NEW).  He has a history of alcohol abuse and withdrawal, colon cancer status post surgery 2007 without follow-up, PUD, GERD, alcoholic pancreatitis, chronic abdominal pain, hypertension, SVT, and chronic systolic heart failure (EF 30-35% in 11/2018).  Had seen Dr. Johnsie Cancel in February of 2020 while in the hospital with SVT in the setting of alcohol withdrawal. 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  Did not keep out patient follow up at that time.   Readmitted back in April of 2021 with heart failure and prolonged QT in the setting of abdominal pain/chest pain. UDS positive for THC. Noted to have ascites. Quite hypokalemic. Required paracentesis - seeing GI - on Lactulose. Looked to have been on coumadin as well. Myoview was intermediate - felt to best manage medically and NOT to pursue cardiac catheterization. Not able to be on guideline therapy.   I then saw him for a post hospital visit back in June - history was very hard to follow. Felt weak and tired. Medicines were very unclear. Was trying to get Medicaid. Reading ability is quite poor.   He has changed/delayed his follow up back here since.   Comes in today. Here alone today. Says a friend brought him.  In slippers and his robe. His medicines again are very unclear - he brought a pill box - says he is filling it up - he has a Lasix 40 mg noted in this pill box - we have not had him on this - unclear who is filling any of his medicines - the remainder are not able to be identified.  He denies chest pain.  Says he notes more swelling in his belly - this seems to make him more short of breath. Weight is up one pound. Eating lots of frozen dinners out of necessity. Notes more pain in his right leg that is of uncertain etiology. He tells me he is not drinking alcohol at this time.   Past Medical History:  Diagnosis Date  . Abnormal nuclear cardiac imaging test   . Acid reflux   . Acute pancreatitis 08/14/2018  . Alcohol withdrawal delirium ([place]) 08/20/2016  . 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 Alcoholic hepatitis   . Alcoholic ketoacidosis 8/89/1694  . Ascites 12/13/2019  . Aspiration pneumonia ([place]) 08/20/2016  . Chest pain of uncertain etiology   . Chronic systolic CHF (congestive heart failure) ([place])   . Cirrhosis ([place])   . Colon cancer ([place])   . DCM (dilated cardiomyopathy) ([place])   . Drug abuse ([place]) 01/07/2020  . Elevated troponin 06/27/2018  . ETOH abuse   . 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 Heme positive stool 11/13/2017  . Hepatic steatosis 08/21/2016  . High anion gap metabolic acidosis 5/0/3888  . History of colon cancer   . HTN (hypertension)   . Hypertensive urgency 06/27/2018  . Hypoglycemia 06/27/2018  . Hypokalemia 01/07/2020  . Hypomagnesemia   . Hypophosphatemia   . Lactic acidosis 08/20/2016  . Leukocytosis 01/07/2020  . Pancreatitis 08/2018  .  Polyp of ascending colon   . Prolonged Q-T interval on ECG   . Prolonged QT interval   . Protein-calorie malnutrition, severe 05/02/2018  . PUD (peptic ulcer disease)   . SBP (spontaneous bacterial peritonitis) ([place]) 01/07/2020  . Sepsis ([place]) 08/20/2016  . Septal infarction ([place]) 01/07/2020  . SVT (supraventricular tachycardia) ([place])   . Thrombocytopenia ([place]) 08/21/2016    Past Surgical History:  Procedure Laterality Date  . BIOPSY  12/14/2019   Procedure: BIOPSY;  Surgeon: Mauri Pole, MD;  Location: [facility];  Service: Endoscopy;;  . COLONOSCOPY WITH PROPOFOL N/A 12/14/2019   Procedure: COLONOSCOPY WITH PROPOFOL;   Surgeon: Mauri Pole, MD;  Location: [facility];  Service: Endoscopy;  Laterality: N/A;  . ESOPHAGOGASTRODUODENOSCOPY (EGD) WITH PROPOFOL N/A 12/14/2019   Procedure: ESOPHAGOGASTRODUODENOSCOPY (EGD) WITH PROPOFOL;  Surgeon: Mauri Pole, MD;  [facility];  Service: Endoscopy;  Laterality: N/A;  . LAPAROSCOPIC SIGMOID COLECTOMY  2007  . POLYPECTOMY  12/14/2019   Procedure: POLYPECTOMY;  Surgeon: Mauri Pole, MD;  [facility];  Service: Endoscopy;;     Medications: Current Meds  Medication Sig  . aspirin 81 MG EC tablet Take 1 tablet (81 mg total) by mouth daily.  . furosemide (LASIX) 40 MG tablet Take 40 mg by mouth.  . lactulose (CHRONULAC) 10 GM/15ML solution Take 30 mLs (20 g total) by mouth 2 (two) times daily.  . magnesium oxide (MAG-OX) 400 (241.3 Mg) MG tablet Take 1 tablet (400 mg total) by mouth daily.  . methocarbamol (ROBAXIN) 500 MG tablet Take 1 tablet (500 mg total) by mouth at bedtime.  . metoprolol succinate (TOPROL-XL) 25 MG 24 hr tablet Take 1 tablet (25 mg total) by mouth daily.  . Multiple Vitamin (MULTI VITAMIN) TABS Take 1 tablet by mouth daily.  . pantoprazole (PROTONIX) 40 MG tablet Take 1 tablet (40 mg total) by mouth in the morning.  Marland Kitchen spironolactone (ALDACTONE) 100 MG tablet Take 1 tablet (100 mg total) by mouth daily.     Allergies: Allergies  Allergen Reactions  . Aspirin Other (See Comments)    Acid reflux   . Penicillins Hives    Has patient had a PCN reaction causing immediate rash, facial/tongue/throat swelling, SOB or lightheadedness with hypotension: yes Has patient had a PCN reaction causing severe rash involving mucus membranes or skin necrosis: no Has patient had a PCN reaction that required hospitalization: no Has patient had a PCN reaction occurring within the last 10 years: no If all of the above answers are "NO", then may proceed with Cephalosporin use.     Social History: The patient  reports  that he has been smoking. He has been smoking about 1.00 pack per day. He has never used smokeless tobacco. He reports current alcohol use of about 2.0 standard drinks of alcohol per week. He reports current drug use. Drug: Marijuana.   Family History: The patient's family history includes Cancer in his sister; Colon cancer in his cousin and father; Kidney disease in his sister.   Review of Systems: Please see the history of present illness.   All other systems are reviewed and negative.   Physical Exam: VS:  BP 124/72   Pulse 68   Ht 6' (1.829 m)   Wt 115 lb 6.4 oz (52.3 kg)   SpO2 99%   BMI 15.65 kg/m  .  BMI Body mass index is 15.65 kg/m.  Wt Readings from Last 3 Encounters:  06/02/20 115 lb 6.4 oz (52.3  kg)  05/06/20 114 lb 2 oz (51.8 kg)  04/09/20 114 lb 8 oz (51.9 kg)    General: He looks very chronically ill. He is in no acute distress. Little difficult to understand again.   HEENT: Sclera are somewhat jaundiced.  Cardiac: Regular rate and rhythm. No murmurs, rubs, or gallops. Trace edema.  Respiratory:  Lungs are fairly clear to auscultation bilaterally with normal work of breathing.  GI: Distended and firm.   MS: No deformity or atrophy. Gait and ROM intact.  Skin: Warm and dry. Color is sallow.  Neuro:  Strength and sensation are intact and no gross focal deficits noted.  Psych: Alert, appropriate and with normal affect.   LABORATORY DATA:  EKG:  EKG is not ordered today.    Lab Results  Component Value Date   WBC 5.1 04/09/2020   HGB 10.8 (L) 04/09/2020   HCT 32.3 (L) 04/09/2020   PLT 208.0 04/09/2020   GLUCOSE 94 04/09/2020   CHOL 134 01/11/2020   TRIG 149 01/11/2020   HDL <10 (L) 01/11/2020   LDLCALC NOT CALCULATED 01/11/2020   ALT 17 04/09/2020   AST 28 04/09/2020   NA 124 (L) 04/09/2020   K 3.6 04/09/2020   CL 89 (L) 04/09/2020   CREATININE 0.92 04/09/2020   BUN 11 04/09/2020   CO2 25 04/09/2020   TSH 1.334 08/21/2016   INR 1.5 (H) 04/09/2020     HGBA1C <4.2 (L) 01/11/2020       BNP (last 3 results) Recent Labs    01/07/20 1345  BNP 211.4*    ProBNP (last 3 results) No results for input(s): PROBNP in the last 8760 hours.   Other Studies Reviewed Today:  Myoview 02/14/2020 Narrative & Impression    There was no ST segment deviation noted during stress.  Defect 1: There is a medium defect of mild severity present in the basal inferolateral, mid inferolateral and apical lateral location.  This is an intermediate risk study.  The left ventricular ejection fraction is mildly decreased (45-54%).  Nuclear stress EF: 51%.  Intermediate risk study due to reversible inferolateral ischemia (moderate extent, mild severity) and mildly depressed left ventricular systolic function.    Echo 11/2018 1. The left ventricle has moderate-severely reduced systolic function of  40-98%. The cavity size was normal. There is no increased left ventricular  wall thickness. Echo evidence of impaired diastolic relaxation Left  ventrical global hypokinesis without  regional wall motion abnormalities.  2. The right ventricle has mildly reduced systolic function. The cavity  was mildly enlarged. There is no increase in right ventricular wall  thickness.  3. The mitral valve is normal in structure. No evidence of mitral valve  stenosis. No significant mitral regurgitation.  4. The tricuspid valve is normal in structure.  5. The aortic valve is tricuspid. No aortic stenosis.  6. The aortic root and ascending aorta are normal in size and structure.  7. The inferior vena cava is normal in size with greater than 50%  respiratory variability.  8. No complete TR doppler jet so unable to estimate PA systolic pressure.    Assessment and Plan:   1. Alcoholic CM - medicines are unclear - may be on Toprol - he is taking Lasix (unclear who prescribed).   2. Alcoholic hepatitis - abdomen seems more distended - he has had prior  paracentesis - trying to get back to GI and let them recheck him and see if this is needed again. They are not able  to see until later next month.   3. Prior abnormal Myoview - felt to best manage conservatively given overall poor/tenuous situation. He denies chest pain at this time.   4. Chronic systolic HF - medicines are unclear. Will see if there are any options for para-medicine to come and see if they can verify his medicines.   5. Marked electrolyte abnormality - rechecking lab today.   6. Tobacco abuse - not discussed.   7. Prior prolonged QT - on Mag Ox - rechecking lab today.   8. Auto anticoagulation due to likely cirrhosis.    Current medicines are reviewed with the patient today.  The patient does not have concerns regarding medicines other than what has been noted above.  The following changes have been made:  See above.  Labs/ tests ordered today include:    Orders Placed This Encounter  Procedures  . Basic metabolic panel  . CBC  . Hepatic function panel  . Magnesium     Disposition:   We need to try and get back to Gi - he may need repeat paracentesis. Checking labs here today. Will reach out to social worker and see if para-medicine can help in anyway. His medicines are basically unknown which further limits our ability to provide care. See Dr. Johnsie Cancel later this fall. Unfortunately, I think his options are quite limited and prognosis is very tenuous moving forward.   Patient is agreeable to this plan and will call if any problems develop in the interim.   SignedTruitt Merle, NP  06/02/2020 8:35 AM  [facility] [address] [place], [place]  [address] Phone: [phone] Fax: [phone]

## 2020-06-02 ENCOUNTER — Ambulatory Visit (INDEPENDENT_AMBULATORY_CARE_PROVIDER_SITE_OTHER): Payer: Self-pay | Admitting: Nurse Practitioner

## 2020-06-02 ENCOUNTER — Telehealth: Payer: Self-pay

## 2020-06-02 ENCOUNTER — Telehealth: Payer: Self-pay | Admitting: Nurse Practitioner

## 2020-06-02 ENCOUNTER — Encounter: Payer: Self-pay | Admitting: Nurse Practitioner

## 2020-06-02 ENCOUNTER — Other Ambulatory Visit: Payer: Self-pay

## 2020-06-02 ENCOUNTER — Ambulatory Visit: Payer: Self-pay | Admitting: Physician Assistant

## 2020-06-02 VITALS — BP 124/72 | HR 68 | Ht 72.0 in | Wt 115.4 lb

## 2020-06-02 DIAGNOSIS — K7011 Alcoholic hepatitis with ascites: Secondary | ICD-10-CM

## 2020-06-02 DIAGNOSIS — I5022 Chronic systolic (congestive) heart failure: Secondary | ICD-10-CM

## 2020-06-02 DIAGNOSIS — I428 Other cardiomyopathies: Secondary | ICD-10-CM

## 2020-06-02 LAB — HEPATIC FUNCTION PANEL
ALT: 15 IU/L (ref 0–44)
AST: 32 IU/L (ref 0–40)
Albumin: 4.2 g/dL (ref 3.8–4.9)
Alkaline Phosphatase: 126 IU/L — ABNORMAL HIGH (ref 48–121)
Bilirubin Total: 0.9 mg/dL (ref 0.0–1.2)
Bilirubin, Direct: 0.54 mg/dL — ABNORMAL HIGH (ref 0.00–0.40)
Total Protein: 7.5 g/dL (ref 6.0–8.5)

## 2020-06-02 LAB — CBC
Hematocrit: 29.2 % — ABNORMAL LOW (ref 37.5–51.0)
Hemoglobin: 9.3 g/dL — ABNORMAL LOW (ref 13.0–17.7)
MCH: 26 pg — ABNORMAL LOW (ref 26.6–33.0)
MCHC: 31.8 g/dL (ref 31.5–35.7)
MCV: 82 fL (ref 79–97)
Platelets: 245 10*3/uL (ref 150–450)
RBC: 3.58 x10E6/uL — ABNORMAL LOW (ref 4.14–5.80)
RDW: 15.8 % — ABNORMAL HIGH (ref 11.6–15.4)
WBC: 5.1 10*3/uL (ref 3.4–10.8)

## 2020-06-02 LAB — MAGNESIUM: Magnesium: 2 mg/dL (ref 1.6–2.3)

## 2020-06-02 LAB — BASIC METABOLIC PANEL
BUN/Creatinine Ratio: 12 (ref 9–20)
BUN: 11 mg/dL (ref 6–24)
CO2: 20 mmol/L (ref 20–29)
Calcium: 9.7 mg/dL (ref 8.7–10.2)
Chloride: 98 mmol/L (ref 96–106)
Creatinine, Ser: 0.89 mg/dL (ref 0.76–1.27)
GFR calc Af Amer: 114 mL/min/{1.73_m2} (ref >59)
GFR calc non Af Amer: 98 mL/min/{1.73_m2} (ref >59)
Glucose: 88 mg/dL (ref 65–99)
Potassium: 4.1 mmol/L (ref 3.5–5.2)
Sodium: 131 mmol/L — ABNORMAL LOW (ref 134–144)

## 2020-06-02 NOTE — Telephone Encounter (Signed)
S/w [facility] @ [phone] to get pt scheduled for appointment for distended abdomen and cirrhosis of the liver.  The first person s/w stated full till the end of oct.  Second person s/w stated if pt's abd needs to be tapped can send to ER.  Cecille Rubin stated there is a 22 hour hold time pt needs appt.  Stated Dr. Henrene Pastor could see pt on Sept 8.  Stated great. GI office called back s/w Vaughan Basta stated pt can be seen today.  S/w pt will keep appt for GI today at 1:30 pm.

## 2020-06-02 NOTE — Telephone Encounter (Signed)
Received call from [facility] and they are requesting pt be seen asap. Pt having a lot of abd distention and state he may need a para. Pt scheduled to see Ellouise Newer PA today at 1:30pm. [facility] notified pt of appt date and time.

## 2020-06-02 NOTE — Telephone Encounter (Signed)
Rosanne Sack is calling from [facility] to let the office know she will be able to get the patient in sooner than 9/8 to see a PA.

## 2020-06-02 NOTE — Patient Instructions (Addendum)
After Visit Summary:  We will be checking the following labs today - BMET, CBC, HPF and Mag level   Medication Instructions:    Continue with your current medicines.    If you need a refill on your cardiac medications before your next appointment, please call your pharmacy.     Testing/Procedures To Be Arranged:  N/A  Follow-Up:   We need to try and get you back to see Gi and see if they want your belly tapped again.   Would move Dr. Johnsie Cancel OV out to later this fall.     At [facility], you and your health needs are our priority.  As part of our continuing mission to provide you with exceptional heart care, we have created designated Provider Care Teams.  These Care Teams include your primary Cardiologist (physician) and Advanced Practice Providers (APPs -  Physician Assistants and Nurse Practitioners) who all work together to provide you with the care you need, when you need it.  Special Instructions:  . Stay safe, wash your hands for at least 20 seconds and wear a mask when needed.  . It was good to talk with you today.    Call the [place] office at [phone] if you have any questions, problems or concerns.

## 2020-06-03 ENCOUNTER — Other Ambulatory Visit: Payer: Self-pay | Admitting: Nurse Practitioner

## 2020-06-03 ENCOUNTER — Encounter: Payer: Self-pay | Admitting: Internal Medicine

## 2020-06-03 ENCOUNTER — Telehealth (HOSPITAL_COMMUNITY): Payer: Self-pay | Admitting: Licensed Clinical Social Worker

## 2020-06-03 ENCOUNTER — Encounter (HOSPITAL_COMMUNITY): Payer: Self-pay | Admitting: Licensed Clinical Social Worker

## 2020-06-03 ENCOUNTER — Ambulatory Visit (INDEPENDENT_AMBULATORY_CARE_PROVIDER_SITE_OTHER): Payer: Self-pay | Admitting: Internal Medicine

## 2020-06-03 ENCOUNTER — Other Ambulatory Visit: Payer: Self-pay | Admitting: *Deleted

## 2020-06-03 VITALS — BP 90/56 | HR 64 | Ht 72.0 in | Wt 114.8 lb

## 2020-06-03 DIAGNOSIS — K2100 Gastro-esophageal reflux disease with esophagitis, without bleeding: Secondary | ICD-10-CM

## 2020-06-03 DIAGNOSIS — K7290 Hepatic failure, unspecified without coma: Secondary | ICD-10-CM

## 2020-06-03 DIAGNOSIS — Z86010 Personal history of colonic polyps: Secondary | ICD-10-CM

## 2020-06-03 DIAGNOSIS — K7682 Hepatic encephalopathy: Secondary | ICD-10-CM

## 2020-06-03 DIAGNOSIS — K7010 Alcoholic hepatitis without ascites: Secondary | ICD-10-CM

## 2020-06-03 DIAGNOSIS — R932 Abnormal findings on diagnostic imaging of liver and biliary tract: Secondary | ICD-10-CM

## 2020-06-03 MED ORDER — FUROSEMIDE 40 MG PO TABS
40.0000 mg | ORAL_TABLET | Freq: Every day | ORAL | 6 refills | Status: DC
Start: 2020-06-03 — End: 2020-06-16

## 2020-06-03 MED FILL — METHOCARBAMOL 500 MG TABS: 500 | 30 days supply | Qty: 30 | Fill #0

## 2020-06-03 NOTE — Progress Notes (Signed)
Pt has been made aware of normal result and verbalized understanding.  jw

## 2020-06-03 NOTE — Telephone Encounter (Signed)
S/w Opal Sidles at the [employer] and wellness @ [phone].confirmed pt's medication's. Pt does not have any refills on lasix but has refills on all other medications.  Will send in lasix.  Will send to [place] to [place].

## 2020-06-03 NOTE — Progress Notes (Signed)
HISTORY OF PRESENT ILLNESS:  Infant Richard [name] is a 54 y.o. male with multiple significant medical problems, related to chronic alcohol abuse, including a history of alcoholic pancreatitis, alcoholic cardiomyopathy with chronic systolic heart failure, hypertension, and history of alcoholic hepatitis for which he was hospitalized the 2021.  He has been seen in this office on a number of occasions this year regarding his liver disease.  He has been abstinent from alcohol for 6 months.  He is accompanied today by his friend, Richard [name].  He is worked on to today schedule as an urgent add-on at the request of cardiology with concerns over recurrent ascites.  Patient did undergo paracentesis of 2.9 L during his hospitalization in April 2021.  The fluid studies were consistent with portal hypertension as the etiology.  He has since recovered clinically and biochemically.  Does mention that he feels like he was abdomen is a bit tight.  However, he has had no change in his weight in over 3 months.  Blood work from yesterday revealed normal liver tests except for a mildly elevated alkaline phosphatase of 126.  Normal albumin 4.2.  Normal bilirubin 0.9.  CBC revealed hemoglobin of 9.3.  Normal MCV of 82.  Normal platelet count 245.  Chemistries were normal except for sodium of 131.  His last INR 2 months ago was 1.5.  He has no complaints of edema otherwise.  He brings with him his medications.  It appears that he is taking furosemide 40 mg daily and spironolactone 100 mg daily.  He also takes lactulose which keeps his bowels regulated.  He denies any issues with bleeding or encephalopathy.  He underwent further colonoscopy and upper endoscopy December 14, 2019.  Colonoscopy revealed diminutive adenoma and evidence of prior surgery.  Upper endoscopy revealed esophagitis but no varices.  His last imaging study of the liver did not reveal mass lesions.  For GERD he is on pantoprazole  REVIEW OF SYSTEMS:  All non-GI ROS  negative unless otherwise stated in the HPI except for fatigue  Past Medical History:  Diagnosis Date  . Abnormal nuclear cardiac imaging test   . Acid reflux   . Acute pancreatitis 08/14/2018  . Alcohol withdrawal delirium ([place]) 08/20/2016  . 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 Alcoholic hepatitis   . Alcoholic ketoacidosis 0/17/5102  . Ascites 12/13/2019  . Aspiration pneumonia ([place]) 08/20/2016  . Chest pain of uncertain etiology   . Chronic systolic CHF (congestive heart failure) ([place])   . Cirrhosis ([place])   . Colon cancer ([place])   . DCM (dilated cardiomyopathy) ([place])   . Drug abuse ([place]) 01/07/2020  . Elevated troponin 06/27/2018  . ETOH abuse   . 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 Heme positive stool 11/13/2017  . Hepatic steatosis 08/21/2016  . High anion gap metabolic acidosis 02/08/5276  . History of colon cancer   . HTN (hypertension)   . Hypertensive urgency 06/27/2018  . Hypoglycemia 06/27/2018  . Hypokalemia 01/07/2020  . Hypomagnesemia   . Hypophosphatemia   . Lactic acidosis 08/20/2016  . Leukocytosis 01/07/2020  . Pancreatitis 08/2018  . Polyp of ascending colon   . Prolonged Q-T interval on ECG   . Prolonged QT interval   . Protein-calorie malnutrition, severe 05/02/2018  . PUD (peptic ulcer disease)   . SBP (spontaneous bacterial peritonitis) ([place]) 01/07/2020  . Sepsis ([place]) 08/20/2016  . Septal infarction ([place]) 01/07/2020  . SVT (supraventricular tachycardia) ([place])   . Thrombocytopenia ([place]) 08/21/2016    Past Surgical History:  Procedure Laterality Date  . BIOPSY  12/14/2019   Procedure: BIOPSY;  Surgeon: Mauri Pole, MD;  [facility];  Service: Endoscopy;;  . COLONOSCOPY WITH PROPOFOL N/A 12/14/2019   Procedure: COLONOSCOPY WITH PROPOFOL;  Surgeon: Mauri Pole, MD;  [facility];  Service: Endoscopy;  Laterality: N/A;  . ESOPHAGOGASTRODUODENOSCOPY (EGD) WITH PROPOFOL N/A 12/14/2019   Procedure: ESOPHAGOGASTRODUODENOSCOPY (EGD) WITH  PROPOFOL;  Surgeon: Mauri Pole, MD;  [facility];  Service: Endoscopy;  Laterality: N/A;  . LAPAROSCOPIC SIGMOID COLECTOMY  2007  . POLYPECTOMY  12/14/2019   Procedure: POLYPECTOMY;  Surgeon: Mauri Pole, MD;  Location: [facility];  Service: Endoscopy;;    Social History Richard [name]  reports that he has been smoking. He has been smoking about 1.00 pack per day. He has never used smokeless tobacco. He reports previous alcohol use of about 2.0 standard drinks of alcohol per week. He reports current drug use. Drug: Marijuana.  family history includes Cancer in his sister; Colon cancer in his cousin and father; Kidney disease in his sister.  Allergies  Allergen Reactions  . Aspirin Other (See Comments)    Acid reflux   . Penicillins Hives    Has patient had a PCN reaction causing immediate rash, facial/tongue/throat swelling, SOB or lightheadedness with hypotension: yes Has patient had a PCN reaction causing severe rash involving mucus membranes or skin necrosis: no Has patient had a PCN reaction that required hospitalization: no Has patient had a PCN reaction occurring within the last 10 years: no If all of the above answers are "NO", then may proceed with Cephalosporin use.        PHYSICAL EXAMINATION: Vital signs: BP (!) 90/56   Pulse 64   Ht 6' (1.829 m)   Wt 114 lb 12.8 oz (52.1 kg)   SpO2 99%   BMI 15.57 kg/m   Constitutional: Thin, chronically ill-appearing, no acute distress Psychiatric: alert and oriented x3, cooperative Eyes: extraocular movements intact, anicteric, conjunctiva pink, irregular left pupil Mouth: oral pharynx moist, no lesions Neck: supple no lymphadenopathy Cardiovascular: heart regular rate and rhythm, no murmur Lungs: clear to auscultation bilaterally Abdomen: soft, nontender, nondistended, no obvious ascites, no peritoneal signs, normal bowel sounds, no organomegaly Rectal: Omitted Extremities: no clubbing,  cyanosis, or lower extremity edema bilaterally Skin: no lesions on visible extremities Neuro: No focal deficits. No asterixis.    ASSESSMENT:  1.  History of alcoholic hepatitis.  Currently appears to have compensated liver disease clinically and biochemically.  I do not appreciate significant ascites that would require paracentesis.  He is currently on Aldactone 100 mg daily and Lasix 40 mg daily.  He has had no change in weight in 3 months, at least.  He has been abstinent from alcohol for 6 months.  No varices on endoscopy. 2.  Alcoholic cardiomyopathy with systolic heart failure, followed by cardiology 3.  Multiple significant medical problems followed by [facility]. [facility] 4.  GERD.  Symptoms controlled on PPI 5.  History of diminutive adenoma.  PLAN:  1.  Abdominal ultrasound to evaluate the hepatic parenchyma.  Rule out [place].  Assess for significant ascites 2.  Continue diuretics.  Periodic blood work with PCP or cardiology to monitor electrolytes and renal function 3.  Continue to avoid all alcohol 4.  Ongoing cardiac care with cardiology and general medical care with his PCP 5.  Routine GI follow-up 1 year A total time of 40 minutes was spent preparing to see the  patient, reviewing outside tests and laboratories, reviewing outside consultative evaluations, obtaining interval history, performing medically appropriate physical exam, counseling the patient regarding his above listed issues, ordering imaging, follow-up, and documenting clinical information in the health record

## 2020-06-03 NOTE — Progress Notes (Signed)
Heart and Vascular Care Navigation  06/03/2020  Kavontae Pritchard [facility] 03-17-1966 [id number]  Reason for Referral: CSW referred to assist patient with resources as patient is uninsured and has multiple medical issues.                                                                                                    Assessment:  Patient is a 54yo male who reports he is currently residing on his brothers couch. 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 He states that the "[employer] [facility]" has helped him remain sober. 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 He has a pending medicaid and social security disability application but unsure of where it is in the process. Patient reports that he "used to not care about anything but now that I stopped drinking I care". Patient reports he has 2 children and grateful to his brother for allowing him to stay. Patient reports concerns with his medications stating he needs refills but does not have the monies to cover the cost.                                     HRT/VAS Care Coordination    Patients Home Cardiology Office [place] Team Social Worker   Social Worker Name: Raquel Sarna, Marlinda Mike [phone]   Living arrangements for the past 2 months Apartment   Lives with: Siblings  Lives with his brother   Patient Current Insurance Coverage Medicaid Pending   Patient Has Concern With [place] Yes   Patient Concerns With Medical Bills Patient states "someone told me they are helping me wiht medical bills"   Does Patient Have Prescription Coverage? Yes   Patient Prescription Assistance Programs [facility] Card   Home Assistive Devices/Equipment Crutches; Cane (specify quad or straight)   DME Agency NA   [place] Agency NA      Social History:                                                                             SDOH Screenings   Alcohol Screen:  Low Risk   . Last Alcohol Screening Score (AUDIT): 0  Depression (PHQ2-9):   . PHQ-2 Score: Not on file  Financial Resource Strain: High Risk  . Difficulty of Paying Living Expenses: Very hard  Food Insecurity: Food Insecurity Present  . Worried About Charity fundraiser in the Last Year: Often true  . Ran Out of Food in the Last Year: Often true  Housing: High Risk  . Last Housing Risk Score: 4  Physical Activity:   .  Days of Exercise per Week: Not on file  . Minutes of Exercise per Session: Not on file  Social Connections:   . Frequency of Communication with Friends and Family: Not on file  . Frequency of Social Gatherings with Friends and Family: Not on file  . Attends Religious Services: Not on file  . Active Member of Clubs or Organizations: Not on file  . Attends Archivist Meetings: Not on file  . Marital Status: Not on file  Stress:   . Feeling of Stress : Not on file  Tobacco Use: High Risk  . Smoking Tobacco Use: Current Every Day Smoker  . Smokeless Tobacco Use: Never Used  Transportation Needs: Unmet Transportation Needs  . Lack of Transportation (Medical): Yes  . Lack of Transportation (Non-Medical): Yes    SDOH Interventions: Financial Resources:  Sales promotion account executive Interventions: Development worker, community, Other (Comment) (Pending Disability and Medicaid application) Social Security for Disability application assistance and will follow up with [place] for further assistance.   Food Insecurity:  Food Insecurity Interventions:  (Patient states he is not eligible for food stamps)  Housing Insecurity:  Housing Interventions: [id number] Referral  Transportation:   Transportation Interventions: Bus Pass Given    Other Care Navigation Interventions:     Inpatient/Outpatient Substance Abuse Counseling/Rehab Options Patient reports he remains sober and denies need for any interventions at this time.  Provided Pharmacy assistance resources [facility] Card    Patient expressed Mental Health concerns No.      Follow-up plan:  CSW followed up with pharmacy and informed that patient had refills recently and should not need any refills at the moment except for 1 medication which CSW will assist with co pay assistance through the Patient [place]. CSW will follow up with [place] for a PCP appointment and follow up appointment with the financial counselor if needed. Due to reported homelessness CSW will refer through [facility] for possible resources to assist with housing concerns although CSW did express to patient that due to no income housing will be challenging. Patient fortunately has temporary housing with family.  Patient grateful for the assistance and support and will remain in contact with CSW as needed. Raquel Sarna, [place], [place]

## 2020-06-03 NOTE — Patient Instructions (Signed)
You have been scheduled for an abdominal ultrasound at [facility] Radiology (1st floor of hospital) on 06/09/2020 at 8:30am. Please arrive 15 minutes prior to your appointment for registration. Make certain not to have anything to eat or drink after midnight prior to your appointment. Should you need to reschedule your appointment, please contact radiology at [phone]. This test typically takes about 30 minutes to perform.  Please follow up as needed

## 2020-06-04 ENCOUNTER — Telehealth: Payer: Self-pay | Admitting: Licensed Clinical Social Worker

## 2020-06-04 NOTE — Telephone Encounter (Signed)
CSW contacted patient to confirm that he picked up his medications at the [facility] yesterday afternoon. Patient states that he continues to have questions about his medications. He states that he has less medications than reported by the pharmacy.  CSW encouraged patient to reach out to providers that prescribed or schedule an appointment to review his medications. Patient verbalizes understanding and will follow up and return call to CSW if needed. Raquel Sarna, [place], [place]

## 2020-06-09 ENCOUNTER — Ambulatory Visit (HOSPITAL_COMMUNITY)
Admission: RE | Admit: 2020-06-09 | Discharge: 2020-06-09 | Disposition: A | Discharge status: Home / Self Care | Payer: Self-pay | Source: Ambulatory Visit | Attending: Internal Medicine | Admitting: Internal Medicine

## 2020-06-09 ENCOUNTER — Other Ambulatory Visit: Payer: Self-pay

## 2020-06-09 DIAGNOSIS — K7010 Alcoholic hepatitis without ascites: Secondary | ICD-10-CM | POA: Insufficient documentation

## 2020-06-10 ENCOUNTER — Ambulatory Visit: Payer: Self-pay | Admitting: Internal Medicine

## 2020-06-12 ENCOUNTER — Ambulatory Visit: Payer: Self-pay | Admitting: Cardiovascular Disease

## 2020-06-16 ENCOUNTER — Ambulatory Visit: Payer: Self-pay | Admitting: *Deleted

## 2020-06-16 ENCOUNTER — Other Ambulatory Visit: Payer: Self-pay | Admitting: Family Medicine

## 2020-06-16 DIAGNOSIS — K7011 Alcoholic hepatitis with ascites: Secondary | ICD-10-CM

## 2020-06-16 DIAGNOSIS — I5023 Acute on chronic systolic (congestive) heart failure: Secondary | ICD-10-CM

## 2020-06-16 DIAGNOSIS — K7290 Hepatic failure, unspecified without coma: Secondary | ICD-10-CM

## 2020-06-16 DIAGNOSIS — K7682 Hepatic encephalopathy: Secondary | ICD-10-CM

## 2020-06-16 DIAGNOSIS — I426 Alcoholic cardiomyopathy: Secondary | ICD-10-CM

## 2020-06-16 DIAGNOSIS — R252 Cramp and spasm: Secondary | ICD-10-CM

## 2020-06-16 MED ORDER — PANTOPRAZOLE SODIUM 40 MG PO TBEC: 40.0000 mg | DELAYED_RELEASE_TABLET | Freq: Every morning | ORAL | 3 refills | Status: DC

## 2020-06-16 MED ORDER — ASPIRIN 81 MG PO TBEC: 81.0000 mg | DELAYED_RELEASE_TABLET | Freq: Every day | ORAL | 3 refills | Status: DC

## 2020-06-16 MED ORDER — MULTI VITAMIN PO TABS: 1.0000 | ORAL_TABLET | Freq: Every day | ORAL | 3 refills | Status: DC

## 2020-06-16 MED ORDER — LACTULOSE 10 GM/15ML PO SOLN: 20.0000 g | Freq: Two times a day (BID) | ORAL | 3 refills | Status: DC

## 2020-06-16 MED ORDER — METOPROLOL SUCCINATE ER 25 MG PO TB24: 25.0000 mg | ORAL_TABLET | Freq: Every day | ORAL | 1 refills | Status: DC

## 2020-06-16 MED ORDER — MAGNESIUM OXIDE 400 (241.3 MG) MG PO TABS: 400.0000 mg | ORAL_TABLET | Freq: Every day | ORAL | 3 refills | Status: DC

## 2020-06-16 MED ORDER — FUROSEMIDE 40 MG PO TABS: 40.0000 mg | ORAL_TABLET | Freq: Every day | ORAL | 1 refills | Status: DC

## 2020-06-16 MED ORDER — SPIRONOLACTONE 100 MG PO TABS: 100.0000 mg | ORAL_TABLET | Freq: Every day | ORAL | 1 refills | Status: DC

## 2020-06-16 NOTE — Telephone Encounter (Signed)
Requested medications are due for refill today? Yes  - This medication refill cannot be delegated.    Requested medications are on active medication list?  Yes  Last Refill:   05/13/2020  # 30 with no refills  Future visit scheduled?  No   Notes to Clinic:  This medication refill cannot be delegated.

## 2020-06-16 NOTE — Telephone Encounter (Signed)
Requested Prescriptions  Pending Prescriptions Disp Refills  . spironolactone (ALDACTONE) 100 MG tablet 30 tablet 6    Sig: Take 1 tablet (100 mg total) by mouth daily.     Cardiovascular: Diuretics - Aldosterone Antagonist Failed - 06/16/2020  6:45 PM      Failed - Na in normal range and within 360 days    Sodium  Date Value Ref Range Status  06/02/2020 131 (L) 134 - 144 mmol/L Final         Passed - Cr in normal range and within 360 days    Creatinine, Ser  Date Value Ref Range Status  06/02/2020 0.89 0.76 - 1.27 mg/dL Final         Passed - K in normal range and within 360 days    Potassium  Date Value Ref Range Status  06/02/2020 4.1 3.5 - 5.2 mmol/L Final         Passed - Last BP in normal range    BP Readings from Last 1 Encounters:  06/03/20 (!) 90/56         Passed - Valid encounter within last 6 months    Recent Outpatient Visits          1 month ago Muscle cramps   [place], [place], NP   3 months ago Hospital discharge follow-up   [place], [place], NP   1 year ago Nausea   [place] Fulp, [place], MD   1 year 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 Office, [facility]           . pantoprazole (PROTONIX) 40 MG tablet 30 tablet 3    Sig: Take 1 tablet (40 mg total) by mouth in the morning.     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 Multiple Vitamin (MULTI VITAMIN) TABS 30 tablet     Sig: Take 1 tablet by mouth daily.     There is no refill protocol information for this order    . metoprolol succinate (TOPROL-XL) 25 MG 24 hr tablet 30 tablet 2    Sig: Take 1 tablet (25 mg  total) by mouth daily.     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 Office, [facility]           . methocarbamol (ROBAXIN) 500 MG tablet 30 tablet 0    Sig: Take 1 tablet (500 mg total) by mouth at bedtime.     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 Office, [facility]           . magnesium oxide (MAG-OX) 400 (241.3 Mg) MG tablet 100 tablet 0    Sig: Take 1 tablet (400 mg total) by mouth daily.     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 lactulose (CHRONULAC) 10 GM/15ML solution 1800 mL 2    Sig: Take  30 mLs (20 g total) by mouth 2 (two) times daily.     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 furosemide (LASIX) 40 MG tablet 30 tablet 6    Sig: Take 1 tablet (40 mg total) by mouth daily.     Cardiovascular:  Diuretics - Loop Failed - 06/16/2020  6:45 PM      Failed - Na in normal range and within 360 days    Sodium  Date Value Ref Range Status  06/02/2020 131 (L) 134 - 144 mmol/L Final         Passed - K in normal range and within 360 days    Potassium  Date Value Ref Range Status  06/02/2020 4.1 3.5 - 5.2 mmol/L Final         Passed - Ca in normal range and within 360 days    Calcium  Date Value Ref Range Status  06/02/2020 9.7 8.7 - 10.2 mg/dL Final         Passed - Cr in normal range and within 360 days    Creatinine, Ser  Date Value Ref Range Status  06/02/2020 0.89 0.76 - 1.27 mg/dL Final         Passed - Last BP in normal range    BP Readings from Last 1 Encounters:  06/03/20 (!) 90/56         Passed - Valid encounter within last 6 months    Recent Outpatient Visits          1 month ago Muscle cramps   [place], [place], NP   3 months ago Hospital discharge follow-up   [place], [place], NP   1 year ago Nausea   [place], MD   1 year 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 aspirin 81 MG EC tablet 30 tablet 2    Sig: Take 1 tablet (81 mg total) by mouth daily.     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

## 2020-06-16 NOTE — Telephone Encounter (Signed)
Attempted to contact patient x 4 today of 2 different # listed in chart. Left messages to call back concerning his c/o of burning sensation in legs to ankles . Patient called back and reports burning sensation has been noted for a couple of months. Progressively from feet to ankles to legs. Denies swelling now but does notice swelling at times. Hands cramp at times. Numbness reported in legs. Reports he can not wear shoes only "slides" or slip on shoes. Pain noted more at night. Patient also reports he is 2 days from being out of all of his medications.forst available appt scheduled for 08/03/20. Patient requesting pain medication for legs. Care advise given. Patent verbalized understanding of care advise and to call back if symptoms worsen.   Reason for Disposition  [1] SEVERE pain (e.g., excruciating, unable to do any normal activities) AND [2] not improved after 2 hours of pain medicine  Answer Assessment - Initial Assessment Questions 1. ONSET: "When did the pain start?"      na 2. LOCATION: "Where is the pain located?"      na 3. PAIN: "How bad is the pain?"    (Scale 1-10; or mild, moderate, severe)   -  MILD (1-3): doesn't interfere with normal activities    -  MODERATE (4-7): interferes with normal activities (e.g., work or school) or awakens from sleep, limping    -  SEVERE (8-10): excruciating pain, unable to do any normal activities, unable to walk     na 4. WORK OR EXERCISE: "Has there been any recent work or exercise that involved this part of the body?"      na 5. CAUSE: "What do you think is causing the leg pain?"     na 6. OTHER SYMPTOMS: "Do you have any other symptoms?" (e.g., chest pain, back pain, breathing difficulty, swelling, rash, fever, numbness, weakness)     na  Protocols used: LEG PAIN-A-AH

## 2020-06-17 ENCOUNTER — Other Ambulatory Visit: Payer: Self-pay | Admitting: Family Medicine

## 2020-06-17 MED ORDER — METHOCARBAMOL 500 MG PO TABS: 500.0000 mg | ORAL_TABLET | Freq: Every evening | ORAL | 0 refills | Status: DC

## 2020-06-17 MED FILL — ?FUROSEMIDE 4OMG TABLETS: 40 | 30 days supply | Qty: 30 | Fill #0

## 2020-06-17 MED FILL — ?SPIRONOLACTONE 100MG TAB: 100 | 30 days supply | Qty: 30 | Fill #3

## 2020-06-17 MED FILL — ?METOPROLOL SUCC ER 25MG TA: 25 | 30 days supply | Qty: 30 | Fill #2

## 2020-06-17 MED FILL — PANTOPRAZOLE SOD DR 40 MG T: 40 | 30 days supply | Qty: 30 | Fill #2

## 2020-06-17 MED FILL — MAGNESIUM OXIDE 400 MG TAB: 400 | 100 days supply | Qty: 100 | Fill #0

## 2020-06-17 NOTE — Telephone Encounter (Signed)
Attempt to call patient back with options for appointment availability.   Patient can be seen at the Mobile Unit which is at the [facility] located on [address] [place].   Otherwise, will need to schedule patient with next available with PCP.

## 2020-06-17 NOTE — Telephone Encounter (Signed)
Please see if you can reach patient regarding the need for follow-up appointment

## 2020-06-18 NOTE — Telephone Encounter (Addendum)
Patient name and DOB verified.  Patient reports that he was informed of message yesterday about the mobile unit but was unable to get there b/c he was hurting so bad.   Scheduled appt on Friday at 85.  Patient verbalized understanding for appt location and time.

## 2020-06-19 ENCOUNTER — Ambulatory Visit (HOSPITAL_BASED_OUTPATIENT_CLINIC_OR_DEPARTMENT_OTHER): Payer: Self-pay | Admitting: Family

## 2020-06-19 DIAGNOSIS — Z5329 Procedure and treatment not carried out because of patient's decision for other reasons: Secondary | ICD-10-CM

## 2020-06-19 NOTE — Progress Notes (Signed)
Patient did not show for appointment.   

## 2020-07-01 ENCOUNTER — Telehealth: Payer: Self-pay | Admitting: Internal Medicine

## 2020-07-01 NOTE — Telephone Encounter (Signed)
I do not see any type of muscle relaxer that [facility] prescribed.  Patient needs to reschedule his recently cancelled appointment with Dr. Henrene Pastor to discuss this.

## 2020-07-01 NOTE — Telephone Encounter (Signed)
Pt called requesting rf for a muscle relaxer that pt states Auberry prescribed it before. Pt did not remember the name of the medication. [facility]

## 2020-07-03 ENCOUNTER — Ambulatory Visit: Payer: Self-pay | Admitting: Internal Medicine

## 2020-07-08 ENCOUNTER — Other Ambulatory Visit: Payer: Self-pay | Admitting: Family Medicine

## 2020-07-08 DIAGNOSIS — K7290 Hepatic failure, unspecified without coma: Secondary | ICD-10-CM

## 2020-07-08 DIAGNOSIS — K7682 Hepatic encephalopathy: Secondary | ICD-10-CM

## 2020-07-08 DIAGNOSIS — R252 Cramp and spasm: Secondary | ICD-10-CM

## 2020-07-08 MED ORDER — LACTULOSE 10 GM/15ML PO SOLN: 20.0000 g | Freq: Two times a day (BID) | ORAL | 3 refills | Status: DC

## 2020-07-08 NOTE — Telephone Encounter (Signed)
Requested Prescriptions  Pending Prescriptions Disp Refills   methocarbamol (ROBAXIN) 500 MG tablet 30 tablet 0    Sig: Take 1 tablet (500 mg total) by mouth at bedtime.     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

## 2020-07-08 NOTE — Telephone Encounter (Signed)
Medication Refill - Medication:Lactulose and methocarbamol  Has the patient contacted their pharmacy? No. (Agent: If no, request that the patient contact the pharmacy for the refill.) (Agent: If yes, when and what did the pharmacy advise?)  Preferred Pharmacy (with phone number or street name): [place], [place]: Please be advised that RX refills may take up to 3 business days. We ask that you follow-up with your pharmacy.

## 2020-07-08 NOTE — Telephone Encounter (Signed)
Requested medication (s) are due for refill today: yes  Requested medication (s) are on the active medication list: yes  Last refill:  06/17/20  Future visit scheduled: yes  Notes to clinic:  not delegated   Requested Prescriptions  Pending Prescriptions Disp Refills   methocarbamol (ROBAXIN) 500 MG tablet 30 tablet 0    Sig: Take 1 tablet (500 mg total) by mouth at bedtime.      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

## 2020-07-08 NOTE — Telephone Encounter (Signed)
Please refill if indicated! 

## 2020-07-09 MED ORDER — METHOCARBAMOL 500 MG PO TABS: 500.0000 mg | ORAL_TABLET | Freq: Every evening | ORAL | 0 refills | Status: DC

## 2020-07-09 MED FILL — LACTULOSE 10 GM/15 ML SOLN: 10 | 30 days supply | Qty: 1800 | Fill #0

## 2020-07-09 MED FILL — ?METHOCARBAMOL 500 MG TABLE: 500 | 30 days supply | Qty: 30 | Fill #0

## 2020-07-10 ENCOUNTER — Ambulatory Visit: Payer: Self-pay | Attending: Family Medicine | Admitting: Family Medicine

## 2020-07-10 ENCOUNTER — Other Ambulatory Visit: Payer: Self-pay | Admitting: Family Medicine

## 2020-07-10 ENCOUNTER — Other Ambulatory Visit: Payer: Self-pay

## 2020-07-10 DIAGNOSIS — M5442 Lumbago with sciatica, left side: Secondary | ICD-10-CM

## 2020-07-10 DIAGNOSIS — Z1331 Encounter for screening for depression: Secondary | ICD-10-CM

## 2020-07-10 DIAGNOSIS — I739 Peripheral vascular disease, unspecified: Secondary | ICD-10-CM

## 2020-07-10 DIAGNOSIS — G8929 Other chronic pain: Secondary | ICD-10-CM

## 2020-07-10 DIAGNOSIS — M79671 Pain in right foot: Secondary | ICD-10-CM

## 2020-07-10 DIAGNOSIS — F17200 Nicotine dependence, unspecified, uncomplicated: Secondary | ICD-10-CM

## 2020-07-10 DIAGNOSIS — M5441 Lumbago with sciatica, right side: Secondary | ICD-10-CM

## 2020-07-10 DIAGNOSIS — M25562 Pain in left knee: Secondary | ICD-10-CM

## 2020-07-10 DIAGNOSIS — Z789 Other specified health status: Secondary | ICD-10-CM

## 2020-07-10 MED ORDER — GABAPENTIN 100 MG PO CAPS: ORAL_CAPSULE | ORAL | 5 refills | Status: DC

## 2020-07-10 MED ORDER — DULOXETINE HCL 60 MG PO CPEP: 60.0000 mg | ORAL_CAPSULE | Freq: Every day | ORAL | 3 refills | Status: DC

## 2020-07-10 MED FILL — GABAPENTIN 100 MG CAPSULE: 100 | 30 days supply | Qty: 120 | Fill #0

## 2020-07-10 MED FILL — DULoxetine HCL 60 MG CPEP: 60 | 30 days supply | Qty: 30 | Fill #0

## 2020-07-10 NOTE — Progress Notes (Signed)
Pain runs up side of legs and in joints

## 2020-07-10 NOTE — Progress Notes (Signed)
Virtual Visit via Telephone Note  I connected with Richard [name]  on 07/10/20 at  8:30 AM EDT by telephone and verified that I am speaking with the correct person using two identifiers.   I discussed the limitations, risks, security and privacy concerns of performing an evaluation and management service by telephone and the availability of in person appointments. I also discussed with the patient that there may be a patient responsible charge related to this service. The patient expressed understanding and agreed to proceed.  Telephone visit is being done in the face of the current COVID-19 pandemic which is causing limitations in face-to-face visits.  Patient Location: Home Provider Location: [facility] Others participating in call: none   History of Present Illness:         54 yo male with complaint of bilateral leg pain that is worse with walking- legs will hurt and feel tired. He also reports recurrent pain in his right foot since he broke his foot when he was working for a [employer]arry [employer] and a trailer that was not connected to the back of a truck properly fell on his foot. He also reports that he broke his left knee while he was playing sports in high school.  He reports a remote injury to his back and feels as if he has pain which radiates down both legs at times.  He reports difficulty walking due to the multiple areas of joint pain.  He  continues to smoke at least a pack of cigarettes per day.  He has got no relief of his symptoms with over-the-counter pain medications.   Past Medical History:  Diagnosis Date  . Abnormal nuclear cardiac imaging test   . Acid reflux   . Acute pancreatitis 08/14/2018  . Alcohol withdrawal delirium ([place]) 08/20/2016  . 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 Alcoholic hepatitis   . Alcoholic ketoacidosis 8/34/1962  . Ascites 12/13/2019  . Aspiration pneumonia ([place]) 08/20/2016  . Chest pain of uncertain etiology   . Chronic systolic CHF (congestive  heart failure) ([place])   . Cirrhosis ([place])   . Colon cancer ([place])   . DCM (dilated cardiomyopathy) ([place])   . Drug abuse ([place]) 01/07/2020  . Elevated troponin 06/27/2018  . ETOH abuse   . 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 Heme positive stool 11/13/2017  . Hepatic steatosis 08/21/2016  . High anion gap metabolic acidosis 11/04/9796  . History of colon cancer   . HTN (hypertension)   . Hypertensive urgency 06/27/2018  . Hypoglycemia 06/27/2018  . Hypokalemia 01/07/2020  . Hypomagnesemia   . Hypophosphatemia   . Lactic acidosis 08/20/2016  . Leukocytosis 01/07/2020  . Pancreatitis 08/2018  . Polyp of ascending colon   . Prolonged Q-T interval on ECG   . Prolonged QT interval   . Protein-calorie malnutrition, severe 05/02/2018  . PUD (peptic ulcer disease)   . SBP (spontaneous bacterial peritonitis) ([place]) 01/07/2020  . Sepsis ([place]) 08/20/2016  . 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 SVT (supraventricular tachycardia) ([place])   . Thrombocytopenia ([place]) 08/21/2016    Past Surgical History:  Procedure Laterality Date  . BIOPSY  12/14/2019   Procedure: BIOPSY;  Surgeon: Mauri Pole, MD;  Location: [facility];  Service: Endoscopy;;  . COLONOSCOPY WITH PROPOFOL N/A 12/14/2019   Procedure: COLONOSCOPY WITH PROPOFOL;  Surgeon: Mauri Pole, MD;  [facility];  Service: Endoscopy;  Laterality: N/A;  . ESOPHAGOGASTRODUODENOSCOPY (EGD) WITH PROPOFOL N/A 12/14/2019   Procedure: ESOPHAGOGASTRODUODENOSCOPY (EGD) WITH PROPOFOL;  Surgeon:  Mauri Pole, MD;  Location: [facility];  Service: Endoscopy;  Laterality: N/A;  . LAPAROSCOPIC SIGMOID COLECTOMY  2007  . POLYPECTOMY  12/14/2019   Procedure: POLYPECTOMY;  Surgeon: Mauri Pole, MD;  [facility];  Service: Endoscopy;;    Family History  Problem Relation Age of Onset  . Colon cancer Father   . Cancer Sister        type unknown  . Kidney disease Sister        dialysis  . Colon cancer Cousin        x 2  . CAD Neg  Hx   . Stroke Neg Hx   . Diabetes Neg Hx     Social History   Tobacco Use  . Smoking status: Current Every Day Smoker    Packs/day: 1.00  . Smokeless tobacco: Never Used  Vaping Use  . Vaping Use: Never used  Substance Use Topics  . Alcohol use: Not Currently    Alcohol/week: 2.0 standard drinks    Types: 1 Cans of beer, 1 Shots of liquor per week    Comment: last drink in February  . Drug use: Yes    Types: Marijuana     Allergies  Allergen Reactions  . Aspirin Other (See Comments)    Acid reflux   . Penicillins Hives    Has patient had a PCN reaction causing immediate rash, facial/tongue/throat swelling, SOB or lightheadedness with hypotension: yes Has patient had a PCN reaction causing severe rash involving mucus membranes or skin necrosis: no Has patient had a PCN reaction that required hospitalization: no Has patient had a PCN reaction occurring within the last 10 years: no If all of the above answers are "NO", then may proceed with Cephalosporin use.        Observations/Objective: No vital signs or physical exam conducted as visit was done via telephone  Assessment and Plan: 1. Chronic right-sided low back pain with bilateral sciatica; 2. Chronic pain of left knee; 3.. right foot pain Patient will be referred to orthopedics in follow-up of issues of chronic low back pain with radiation in addition to chronic knee pain and chronic right foot pain.  Prescription will be provided for gabapentin to help with neuropathic pain due to sciatica and prescription for duloxetine to help with back pain.  He reports that he is not receiving any relief from over-the-counter pain medications. - AMB referral to orthopedics - gabapentin (NEURONTIN) 100 MG capsule; One pill in the morning, one pill midday and 2 pills at night to help with nerve pain  Dispense: 120 capsule; Refill: 5 - DULoxetine (CYMBALTA) 60 MG capsule; Take 1 capsule (60 mg total) by mouth daily. In the morning   Dispense: 90 capsule; Refill: 3  4. Positive depression screening; 5. Needor follow-up by Education officer, museum Patient with positive depression screening a fnd admits to increased stress and anxiety due to his chronic health issues as well as financial issues.  Social work consult will be placed so the patient can be contacted regarding possible counseling as well as Emergency planning/management officer options.  Additionally, patient will be started on Cymbalta 60 mg daily which can help with pain as well as depression. - Ambulatory referral to Social Work - DULoxetine (CYMBALTA) 60 MG capsule; Take 1 capsule (60 mg total) by mouth daily. In the morning  Dispense: 90 capsule; Refill: 3  6. Claudication of both lower extremities [facility]): Tobacco dependence Referral to vascular surgery for further evaluation as patient with complaint  of bilateral leg pain and leg fatigue with walking and patient continues to smoke approximately 1 pack/day of cigarettes.  He does not believe that he can stop smoking at this time. - Ambulatory referral to Vascular Surgery  Follow Up Instructions:Return in about 6 weeks (around 08/21/2020) for chronic pain.    I discussed the assessment and treatment plan with the patient. The patient was provided an opportunity to ask questions and all were answered. The patient agreed with the plan and demonstrated an understanding of the instructions.   The patient was advised to call back or seek an in-person evaluation if the symptoms worsen or if the condition fails to improve as anticipated.  I provided 18 minutes of non-face-to-face time during this encounter.   Antony Blackbird, MD

## 2020-07-11 ENCOUNTER — Encounter: Payer: Self-pay | Admitting: Family Medicine

## 2020-07-13 ENCOUNTER — Other Ambulatory Visit: Payer: Self-pay | Admitting: Family Medicine

## 2020-07-13 ENCOUNTER — Telehealth: Payer: Self-pay | Admitting: Family Medicine

## 2020-07-13 DIAGNOSIS — K7011 Alcoholic hepatitis with ascites: Secondary | ICD-10-CM

## 2020-07-13 DIAGNOSIS — I5023 Acute on chronic systolic (congestive) heart failure: Secondary | ICD-10-CM

## 2020-07-13 DIAGNOSIS — R252 Cramp and spasm: Secondary | ICD-10-CM

## 2020-07-13 DIAGNOSIS — I426 Alcoholic cardiomyopathy: Secondary | ICD-10-CM

## 2020-07-13 NOTE — Telephone Encounter (Signed)
Refill requests for Spironolactone, Metoprolol,and Furosemide all refillled 06/16/20, #90 each, and Methocarbamol refilled 07/09/20, #30; unsecces attempt to contact pharmacy to verify refills were picked up; no answer; will route to office for final disposition.  Requested medication (s) are due for refill today: Metocarbamol, yes  Requested medication (s) are on the active medication list: yes  Last refill: 07/09/20  Future visit scheduled: No  Notes to clinic:  Not delegated  Requested medication (s) are on the active medication list: Furosemide, Spironolactone and Metoprolol, yes  Requested medication (s) are due for refill today:  Requested medication (s) are on the active medication list: yes  Last refill:  06/16/20; #90 for each medication  Future visit scheduled: no  Notes to clinic: unable to verify if refills were picked up from pharmacy

## 2020-07-13 NOTE — Telephone Encounter (Signed)
Pt aware and voiced understanding 

## 2020-07-13 NOTE — Telephone Encounter (Signed)
Patient is calling regarding referral to orthopedic specialist. Patient is asking how is he supposed to get to the office when he is not able to get up because of his foot pain.  And  They are asking for a $100.  Patient does not know when his medicaid his going to kick in. Patient was advised to check with DSS regarding his medicaid start date. Please advise Cb- [phone]

## 2020-07-13 NOTE — Telephone Encounter (Signed)
Let patient know that I have also sent in medication that he can start to help with his pain in the meantime

## 2020-07-13 NOTE — Telephone Encounter (Signed)
Medication: furosemide (LASIX) 40 MG tablet [[id number]] , metoprolol succinate (TOPROL-XL) 25 MG 24 hr tablet [[id number]] , methocarbamol (ROBAXIN) 500 MG tablet [[id number]] , spironolactone (ALDACTONE) 100 MG tablet [[id number]]   Has the patient contacted their pharmacy? YES (Agent: If no, request that the patient contact the pharmacy for the refill.) (Agent: If yes, when and what did the pharmacy advise?)  Preferred Pharmacy (with phone number or street name): [place], [address] [place] [address] Phone: [phone] Fax: [phone] Hours: Not open 24 hours    Agent: Please be advised that RX refills may take up to 3 business days. We ask that you follow-up with your pharmacy.

## 2020-07-17 ENCOUNTER — Telehealth: Payer: Self-pay | Admitting: Licensed Clinical Social Worker

## 2020-07-17 NOTE — Telephone Encounter (Signed)
MSW intern called and made appt with Pt for a tele visit with [place].

## 2020-07-21 MED FILL — PANTOPRAZOLE SOD DR 40 MG T: 40 | 30 days supply | Qty: 30 | Fill #3

## 2020-07-21 MED FILL — METOPROLOL SUCCINATE ER 25: 25 | 30 days supply | Qty: 30 | Fill #1

## 2020-07-21 MED FILL — ?SPIRONOLACTONE 100MG TAB: 100 | 30 days supply | Qty: 30 | Fill #4

## 2020-07-21 MED FILL — FUROSEMIDE 40 MG TAB: 40 | 30 days supply | Qty: 30 | Fill #1

## 2020-07-23 ENCOUNTER — Ambulatory Visit: Payer: Self-pay | Attending: Family Medicine | Admitting: Licensed Clinical Social Worker

## 2020-07-23 ENCOUNTER — Other Ambulatory Visit: Payer: Self-pay

## 2020-07-23 DIAGNOSIS — F4323 Adjustment disorder with mixed anxiety and depressed mood: Secondary | ICD-10-CM

## 2020-07-28 ENCOUNTER — Other Ambulatory Visit: Payer: Self-pay

## 2020-07-28 DIAGNOSIS — I739 Peripheral vascular disease, unspecified: Secondary | ICD-10-CM

## 2020-08-03 ENCOUNTER — Telehealth: Payer: Self-pay | Admitting: Family Medicine

## 2020-08-03 NOTE — Progress Notes (Signed)
CARDIOLOGY CONSULT NOTE       Patient ID: Richard [name] MRN: [id number] DOB/AGE: 52-Jan-1967 52 y.o.  Primary Physician: Antony Blackbird, MD Primary Cardiologist: Johnsie Cancel  HPI:  54 y.o. with significant history of alcohol abuse and withdrawal complicated by pancreatitis, PUD GERD and chronic abdominal pain. Seen by me in February 2020 with SVT and DCM EF 30-35% He was admitted with cirrhosis and ascites April 2021 volume overload and long QT UDS positive for Required paracentesis  Myovue 01/2020 with no ischemia inferior/lateral apical defect EF estimated 51% Cath not pursued due to lack of ischemia co-morbidities and more likely alcoholic non ischemic DCM   Synthetic liver enzymes were normal 06/02/20  Abdominal US 06/09/20 no ascites mentioned parenchymal liver dx  Chronic pain in legs / back with previous broken right foot and left knee injury playing sports in highschool He is a smoker and Community wellness referred Him to orthopedics and [facility]VS Started on Cymbalta for depression     ROS All other systems reviewed and negative except as noted above  Past Medical History:  Diagnosis Date  . Abnormal nuclear cardiac imaging test   . Acid reflux   . Acute pancreatitis 08/14/2018  . Alcohol withdrawal delirium ([place]) 08/20/2016  . 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 Alcoholic hepatitis   . Alcoholic ketoacidosis 4/43/1540  . Ascites 12/13/2019  . Aspiration pneumonia ([place]) 08/20/2016  . Chest pain of uncertain etiology   . Chronic systolic CHF (congestive heart failure) ([place])   . Cirrhosis ([place])   . Colon cancer ([place])   . DCM (dilated cardiomyopathy) ([place])   . Drug abuse ([place]) 01/07/2020  . Elevated troponin 06/27/2018  . ETOH abuse   . 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 Heme positive stool 11/13/2017  . Hepatic steatosis 08/21/2016  . High anion gap metabolic acidosis 0/05/6760  . History of colon cancer   . HTN (hypertension)   . Hypertensive urgency 06/27/2018  . Hypoglycemia  06/27/2018  . Hypokalemia 01/07/2020  . Hypomagnesemia   . Hypophosphatemia   . Lactic acidosis 08/20/2016  . Leukocytosis 01/07/2020  . Pancreatitis 08/2018  . Polyp of ascending colon   . Prolonged Q-T interval on ECG   . Prolonged QT interval   . Protein-calorie malnutrition, severe 05/02/2018  . PUD (peptic ulcer disease)   . SBP (spontaneous bacterial peritonitis) ([place]) 01/07/2020  . Sepsis ([place]) 08/20/2016  . Septal infarction ([place]) 01/07/2020  . SVT (supraventricular tachycardia) ([place])   . Thrombocytopenia ([place]) 08/21/2016    Family History  Problem Relation Age of Onset  . Colon cancer Father   . Cancer Sister        type unknown  . Kidney disease Sister        dialysis  . Colon cancer Cousin        x 2  . CAD Neg Hx   . Stroke Neg Hx   . Diabetes Neg Hx     Social History   Socioeconomic History  . Marital status: Single    Spouse name: Not on file  . Number of children: Not on file  . Years of education: Not on file  . Highest education level: Not on file  Occupational History  . Not on file  Tobacco Use  . Smoking status: Current Every Day Smoker    Packs/day: 1.00  . Smokeless tobacco: Never Used  Vaping Use  . Vaping Use: Never used  Substance and Sexual Activity  . Alcohol use: Not Currently  Alcohol/week: 2.0 standard drinks    Types: 1 Cans of beer, 1 Shots of liquor per week    Comment: last drink in February  . Drug use: Yes    Types: Marijuana  . Sexual activity: Not Currently  Other Topics Concern  . Not on file  Social History Narrative  . Not on file   Social Determinants of Health   Financial Resource Strain: High Risk  . Difficulty of Paying Living Expenses: Very hard  Food Insecurity: Food Insecurity Present  . Worried About Charity fundraiser in the Last Year: Often true  . Ran Out of Food in the Last Year: Often true  Transportation Needs: Unmet Transportation Needs  . Lack of Transportation (Medical): Yes  . Lack of  Transportation (Non-Medical): Yes  Physical Activity:   . Days of Exercise per Week: Not on file  . Minutes of Exercise per Session: Not on file  Stress:   . Feeling of Stress : Not on file  Social Connections:   . Frequency of Communication with Friends and Family: Not on file  . Frequency of Social Gatherings with Friends and Family: Not on file  . Attends Religious Services: Not on file  . Active Member of Clubs or Organizations: Not on file  . Attends Archivist Meetings: Not on file  . Marital Status: Not on file  Intimate Partner Violence:   . Fear of Current or Ex-Partner: Not on file  . Emotionally Abused: Not on file  . Physically Abused: Not on file  . Sexually Abused: Not on file    Past Surgical History:  Procedure Laterality Date  . BIOPSY  12/14/2019   Procedure: BIOPSY;  Surgeon: Mauri Pole, MD;  [facility];  Service: Endoscopy;;  . COLONOSCOPY WITH PROPOFOL N/A 12/14/2019   Procedure: COLONOSCOPY WITH PROPOFOL;  Surgeon: Mauri Pole, MD;  [facility];  Service: Endoscopy;  Laterality: N/A;  . ESOPHAGOGASTRODUODENOSCOPY (EGD) WITH PROPOFOL N/A 12/14/2019   Procedure: ESOPHAGOGASTRODUODENOSCOPY (EGD) WITH PROPOFOL;  Surgeon: Mauri Pole, MD;  [facility];  Service: Endoscopy;  Laterality: N/A;  . LAPAROSCOPIC SIGMOID COLECTOMY  2007  . POLYPECTOMY  12/14/2019   Procedure: POLYPECTOMY;  Surgeon: Mauri Pole, MD;  [facility];  Service: Endoscopy;;      Current Outpatient Medications:  .  aspirin 81 MG EC tablet, Take 1 tablet (81 mg total) by mouth daily., Disp: 90 tablet, Rfl: 3 .  furosemide (LASIX) 40 MG tablet, Take 1 tablet (40 mg total) by mouth daily., Disp: 90 tablet, Rfl: 1 .  gabapentin (NEURONTIN) 100 MG capsule, One pill in the morning, one pill midday and 2 pills at night to help with nerve pain, Disp: 120 capsule, Rfl: 5 .  magnesium oxide (MAG-OX) 400 (241.3 Mg) MG  tablet, Take 1 tablet (400 mg total) by mouth daily., Disp: 100 tablet, Rfl: 3 .  metoprolol succinate (TOPROL-XL) 25 MG 24 hr tablet, Take 1 tablet (25 mg total) by mouth daily., Disp: 90 tablet, Rfl: 1 .  pantoprazole (PROTONIX) 40 MG tablet, Take 1 tablet (40 mg total) by mouth in the morning., Disp: 90 tablet, Rfl: 3 .  spironolactone (ALDACTONE) 100 MG tablet, Take 1 tablet (100 mg total) by mouth daily., Disp: 90 tablet, Rfl: 1    Physical Exam: Blood pressure (!) 112/58, pulse 65, height 6' (1.829 m), weight 55.2 kg, SpO2 99 %.   Affect appropriate Chronically ill male  HEENT: normal Neck supple  with no adenopathy JVP normal no bruits no thyromegaly Lungs clear with no wheezing and good diaphragmatic motion Heart:  S1/S2 no murmur, no rub, gallop or click PMI normal Abdomen: benighn, BS positve, no tenderness, no AAA no bruit.  No HSM or HJR Distal pulses intact with no bruits No edema Neuro non-focal Skin warm and dry No muscular weakness   Labs:   Lab Results  Component Value Date   WBC 5.1 06/02/2020   HGB 9.3 (L) 06/02/2020   HCT 29.2 (L) 06/02/2020   MCV 82 06/02/2020   PLT 245 06/02/2020   No results for input(s): NA, K, CL, CO2, BUN, CREATININE, CALCIUM, PROT, BILITOT, ALKPHOS, ALT, AST, GLUCOSE in the last 168 hours.  Invalid input(s): LABALBU Lab Results  Component Value Date   DCVUDTH 438 (H) 08/21/2016   TROPONINI <0.03 11/05/2018    Lab Results  Component Value Date   CHOL 134 01/11/2020   CHOL 97 05/04/2018   Lab Results  Component Value Date   HDL <10 (L) 01/11/2020   HDL 35 (L) 05/04/2018   Lab Results  Component Value Date   LDLCALC NOT CALCULATED 01/11/2020   LDLCALC 49 05/04/2018   Lab Results  Component Value Date   TRIG 149 01/11/2020   TRIG 63 05/04/2018   Lab Results  Component Value Date   CHOLHDL NOT CALCULATED 01/11/2020   CHOLHDL 2.8 05/04/2018   No results found for: LDLDIRECT    Radiology: No results  found.  EKG: SR rate 69 normal 03/16/20    ASSESSMENT AND PLAN:   1. SVT:  Quiescent distantly related to withdrawal ECG normal on beta blocker  2. ETOH/Cirrhosis:  With previous paracentesis [place] 06/09/20 benign needs f/u with GI continue lasix, aldactone and lactulose  3. Depression: started on Cymbalta by primary still seems down  4. GERD:  Improved with protonix  5. DCM:  Improved with last echo EF 60-65% 01/07/20 related to ETOH/Drug use 6. Back/Leg Pain:  ABI's ordered by primary and referred to [facility]/Ortho suspect this is more neuropathic  Signed: Jenkins Rouge 08/10/2020, 9:22 AM

## 2020-08-06 ENCOUNTER — Ambulatory Visit: Payer: Self-pay | Attending: Family Medicine | Admitting: Licensed Clinical Social Worker

## 2020-08-06 ENCOUNTER — Other Ambulatory Visit: Payer: Self-pay

## 2020-08-06 DIAGNOSIS — F4323 Adjustment disorder with mixed anxiety and depressed mood: Secondary | ICD-10-CM

## 2020-08-10 ENCOUNTER — Ambulatory Visit (INDEPENDENT_AMBULATORY_CARE_PROVIDER_SITE_OTHER): Payer: Self-pay | Admitting: Cardiovascular Disease

## 2020-08-10 ENCOUNTER — Encounter: Payer: Self-pay | Admitting: Cardiovascular Disease

## 2020-08-10 ENCOUNTER — Other Ambulatory Visit: Payer: Self-pay

## 2020-08-10 VITALS — BP 112/58 | HR 65 | Ht 72.0 in | Wt 121.8 lb

## 2020-08-10 DIAGNOSIS — I471 Supraventricular tachycardia: Secondary | ICD-10-CM

## 2020-08-10 DIAGNOSIS — K7030 Alcoholic cirrhosis of liver without ascites: Secondary | ICD-10-CM

## 2020-08-10 NOTE — Patient Instructions (Signed)

## 2020-08-10 NOTE — BH Specialist Note (Signed)
Integrated Behavioral Health Visit via Telemedicine (Telephone)  07/23/2020 Richard [name] [id number]  Number of [place] visits: 1 Session Start time: 10:05 AM  Session End time: 10:25 AM Total time: 20 minutes  Referring Provider: Dr. Chapman Fitch Type of Service: Individual Patient location: Home [facility] Provider location: Office All persons participating in visit: LCSW and Patient   I connected with Richard [name] by telephone and verified that I am speaking with the correct person using two identifiers.   Discussed confidentiality: Yes   Confirmed demographics & insurance:  Yes   I discussed that engaging in this virtual visit, they consent to the provision of behavioral healthcare and the services will be billed under their insurance.   Patient and/or legal guardian expressed understanding and consented to virtual visit: Yes   PRESENTING CONCERNS: Patient or family reports the following symptoms/concerns: Pt reports difficulty managing depression and anxiety symptoms triggered by chronic pain and financial strain Duration of problem: Ongoing; Severity of problem: severe  STRENGTHS (Protective Factors/Coping Skills): Social connections, Social and Emotional competence and Concrete supports in place (healthy food, safe environments, etc.)  ASSESSMENT: Patient currently experiencing difficulty managing depression and anxiety symptoms triggered by chronic pain and psychosocial stressors.  Pt will benefit from medication management and therapy. He has a pending disability and medicaid case. Family is financially supportive.   GOALS ADDRESSED: Patient will: 1.  Reduce symptoms of: anxiety and depression Pt agreed to comply with med management 2.  Demonstrate ability to: Increase healthy adjustment to current life circumstances and Increase adequate support systems for patient/family Pt agreed to follow up with GI to re-schedule appointment.   Progress of  Goals: Ongoing  INTERVENTIONS: Interventions utilized:  Solution-Focused Strategies, Supportive Counseling, Psychoeducation and/or Health Education and Link to [employer] Standardized Assessments completed & reviewed: Not Needed   OUTCOME: Patient Response: Pt was engaged during session. Pt provided verbal consent for LCSW to complete referral to Legal Aid for SSI appeal. SCAT and Financial Counseling applications will be mailed to assist with transportation and financial barriers   PLAN: 1. Follow up with behavioral health clinician on : 08/06/20 2. Behavioral recommendations: Utilize strategies and supportive resources 3. Referral(s): Integrated Orthoptist (In Clinic) and [employer] Resources:  Financial trader  I discussed the assessment and treatment plan with the patient and/or parent/guardian. They were provided an opportunity to ask questions and all were answered. They agreed with the plan and demonstrated an understanding of the instructions.   They were advised to call back or seek an in-person evaluation as appropriate.  I discussed that the purpose of this visit is to provide behavioral health care while limiting exposure to the novel coronavirus.  Discussed there is a possibility of technology failure and discussed alternative modes of communication if that failure occurs.  Rebekah Chesterfield, LCSW 08/10/20 1:52 AM

## 2020-08-12 ENCOUNTER — Encounter: Payer: Self-pay | Admitting: Vascular Surgery

## 2020-08-12 ENCOUNTER — Ambulatory Visit (HOSPITAL_COMMUNITY): Payer: MEDICAID

## 2020-08-18 ENCOUNTER — Other Ambulatory Visit (INDEPENDENT_AMBULATORY_CARE_PROVIDER_SITE_OTHER): Payer: Self-pay

## 2020-08-18 ENCOUNTER — Ambulatory Visit (INDEPENDENT_AMBULATORY_CARE_PROVIDER_SITE_OTHER): Payer: Self-pay | Admitting: Gastroenterology

## 2020-08-18 ENCOUNTER — Encounter: Payer: Self-pay | Admitting: Gastroenterology

## 2020-08-18 ENCOUNTER — Other Ambulatory Visit: Payer: Self-pay | Admitting: Gastroenterology

## 2020-08-18 VITALS — BP 90/60 | HR 76 | Ht 70.0 in | Wt 120.2 lb

## 2020-08-18 DIAGNOSIS — K7460 Unspecified cirrhosis of liver: Secondary | ICD-10-CM

## 2020-08-18 DIAGNOSIS — F1011 Alcohol abuse, in remission: Secondary | ICD-10-CM

## 2020-08-18 DIAGNOSIS — R1084 Generalized abdominal pain: Secondary | ICD-10-CM

## 2020-08-18 LAB — COMPREHENSIVE METABOLIC PANEL
ALT: 15 U/L (ref 0–53)
AST: 34 U/L (ref 0–37)
Albumin: 4.4 g/dL (ref 3.5–5.2)
Alkaline Phosphatase: 127 U/L — ABNORMAL HIGH (ref 39–117)
BUN: 11 mg/dL (ref 6–23)
CO2: 22 mEq/L (ref 19–32)
Calcium: 10.1 mg/dL (ref 8.4–10.5)
Chloride: 93 mEq/L — ABNORMAL LOW (ref 96–112)
Creatinine, Ser: 1 mg/dL (ref 0.40–1.50)
GFR: 86.41 mL/min (ref >60.00)
Glucose, Bld: 84 mg/dL (ref 70–99)
Potassium: 4.2 mEq/L (ref 3.5–5.1)
Sodium: 124 mEq/L — ABNORMAL LOW (ref 135–145)
Total Bilirubin: 1 mg/dL (ref 0.2–1.2)
Total Protein: 8.6 g/dL — ABNORMAL HIGH (ref 6.0–8.3)

## 2020-08-18 LAB — CBC WITH DIFFERENTIAL/PLATELET
Basophils Absolute: 0.1 10*3/uL (ref 0.0–0.1)
Basophils Relative: 1.2 % (ref 0.0–3.0)
Eosinophils Absolute: 0.2 10*3/uL (ref 0.0–0.7)
Eosinophils Relative: 2.7 % (ref 0.0–5.0)
HCT: 34.9 % — ABNORMAL LOW (ref 39.0–52.0)
Hemoglobin: 11.3 g/dL — ABNORMAL LOW (ref 13.0–17.0)
Lymphocytes Relative: 26.4 % (ref 12.0–46.0)
Lymphs Abs: 1.6 10*3/uL (ref 0.7–4.0)
MCHC: 32.5 g/dL (ref 30.0–36.0)
MCV: 76.6 fl — ABNORMAL LOW (ref 78.0–100.0)
Monocytes Absolute: 0.7 10*3/uL (ref 0.1–1.0)
Monocytes Relative: 11.8 % (ref 3.0–12.0)
Neutro Abs: 3.6 10*3/uL (ref 1.4–7.7)
Neutrophils Relative %: 57.9 % (ref 43.0–77.0)
Platelets: 175 10*3/uL (ref 150.0–400.0)
RBC: 4.55 Mil/uL (ref 4.22–5.81)
RDW: 17.6 % — ABNORMAL HIGH (ref 11.5–15.5)
WBC: 6.2 10*3/uL (ref 4.0–10.5)

## 2020-08-18 LAB — PROTIME-INR
INR: 1.5 ratio — ABNORMAL HIGH (ref 0.8–1.0)
Prothrombin Time: 16.4 s — ABNORMAL HIGH (ref 9.6–13.1)

## 2020-08-18 MED ORDER — HYOSCYAMINE SULFATE 0.125 MG SL SUBL: 0.1250 mg | SUBLINGUAL_TABLET | Freq: Three times a day (TID) | SUBLINGUAL | 1 refills | Status: DC | PRN

## 2020-08-18 MED FILL — OSCIMIN 0.125 MG SUBL: 0.125 | 10 days supply | Qty: 30 | Fill #0

## 2020-08-18 NOTE — Patient Instructions (Signed)
Your provider has requested that you go to the basement level for lab work before leaving today. Press "B" on the elevator. The lab is located at the first door on the left as you exit the elevator.  We have sent the following medications to your pharmacy for you to pick up at your convenience:  Levsin  For your nasal problems you can try the following: Flonase nasal spray or saline nasal spray, Zyrtec, Xyzal, Allegra, or Claritin

## 2020-08-18 NOTE — BH Specialist Note (Signed)
Integrated Behavioral Health Visit via Telemedicine (Telephone)  08/06/2020 Richard Cooter Mccaslin [id number]  Number of [place] visits: 2 Session Start time: 10:05 AM  Session End time: 10:20 AM Total time: 15 minutes  Referring Provider: Dr. Chapman Fitch Type of Service: Individual Patient location: [facility] Provider location: Office All persons participating in visit: LCSW and Patient   I connected with Richard [name] by telephone and verified that I am speaking with the correct person using two identifiers.   Discussed confidentiality: Yes   Confirmed demographics & insurance:  Yes   I discussed that engaging in this virtual visit, they consent to the provision of behavioral healthcare and the services will be billed under their insurance.   Patient and/or legal guardian expressed understanding and consented to virtual visit: Yes   PRESENTING CONCERNS: Patient or family reports the following symptoms/concerns: Pt reports difficulty managing health conditions and psychosocial stressors. States that gabapentin isn't managing pain and he has been feeling nauseous due to draining sinuses Duration of problem: Ongoing; Severity of problem: severe  STRENGTHS (Protective Factors/Coping Skills): Social connections and [place] supports in place (healthy food, safe environments, etc.)  ASSESSMENT: Patient currently experiencing difficulty managing depression and anxiety symptoms triggered by chronic pain and psychosocial stressors. He reports that prescribed medication is not assisting with managing ongoing pain.   Pt will benefit from medication management and therapy.    GOALS ADDRESSED: Patient will: 1.  Reduce symptoms of: anxiety and depression Pt agreed to comply with med management 2.  Demonstrate ability to: Increase healthy adjustment to current life circumstances and Increase adequate support systems for patient/family Pt agreed to follow up with GI to  re-schedule appointment  Progress of Goals: Ongoing  INTERVENTIONS: Interventions utilized:  Solution-Focused Strategies Standardized Assessments completed & reviewed: Not Needed   OUTCOME: Patient Response: Pt was engaged during visit. He agreed to follow up with PCP at upcoming appointment and schedule follow up with GI. Pt reports that he has yet to receive SCAT application and has not heard back from Medicaid.    PLAN: 1. Follow up with behavioral health clinician on : Pt encouraged to schedule follow up appt 2. Behavioral recommendations: Utilize strategies discussed  3. Referral(s): [place] (In Clinic)  I discussed the assessment and treatment plan with the patient and/or parent/guardian. They were provided an opportunity to ask questions and all were answered. They agreed with the plan and demonstrated an understanding of the instructions.   They were advised to call back or seek an in-person evaluation as appropriate.  I discussed that the purpose of this visit is to provide behavioral health care while limiting exposure to the novel coronavirus.  Discussed there is a possibility of technology failure and discussed alternative modes of communication if that failure occurs.  Rebekah Chesterfield, LCSW 08/21/2020 9:58 PM

## 2020-08-18 NOTE — Progress Notes (Signed)
08/18/2020 Richard [name] [id number] 1965/10/14   HISTORY OF PRESENT ILLNESS: This is a 54 year old male who is a patient of Dr. Blanch Media.  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  He has been seen in the office on a number of occasions this year regarding his liver disease, last on September 1 by Dr. Henrene Pastor.  He has not had any alcohol since February of this year.  He is here alone today.  He had some issues with ascites earlier this year and had a paracentesis with 2.9 L removed during his hospitalization in April.  He has not required repeat paracentesis since then.  He is maintained on Lasix 40 mg daily and Aldactone 100 mg daily.  He had EGD and colonoscopy December 14, 2019.  Colonoscopy revealed diminutive adenoma and evidence of prior surgery.  EGD revealed esophagitis but no varices.  He had an ultrasound in September that showed no focal liver lesions and no evidence of ascites.  Today he complains of intermittent diffuse abdominal pain.  This comes and goes.  He is also complaining of a lot of postnasal drip/sinus drainage.  He is asking what he can take for that.   Past Medical History:  Diagnosis Date  . Abnormal nuclear cardiac imaging test   . Acid reflux   . Acute pancreatitis 08/14/2018  . Alcohol withdrawal delirium ([place]) 08/20/2016  . Alcoholic cardiomyopathy ([place]) 01/07/2020  . Alcoholic hepatitis   . Alcoholic ketoacidosis 01/03/4741  . Ascites 12/13/2019  . Aspiration pneumonia ([place]) 08/20/2016  . Chest pain of uncertain etiology   . Chronic systolic CHF (congestive heart failure) ([place])   . Cirrhosis ([place])   . Colon cancer ([place])   . DCM (dilated cardiomyopathy) ([place])   . Drug abuse ([place]) 01/07/2020  . Elevated troponin 06/27/2018  . ETOH  abuse   . 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 Heme positive stool 11/13/2017  . Hepatic steatosis 08/21/2016  . High anion gap metabolic acidosis 02/08/5637  . History of colon cancer   . HTN (hypertension)   . Hypertensive urgency 06/27/2018  . Hypoglycemia 06/27/2018  . Hypokalemia 01/07/2020  . Hypomagnesemia   . Hypophosphatemia   . Lactic acidosis 08/20/2016  . Leukocytosis 01/07/2020  . Pancreatitis 08/2018  . Polyp of ascending colon   . Prolonged Q-T interval on ECG   . Prolonged QT interval   . Protein-calorie malnutrition, severe 05/02/2018  . PUD (peptic ulcer disease)   . SBP (spontaneous bacterial peritonitis) ([place]) 01/07/2020  . Sepsis ([place]) 08/20/2016  . 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 SVT (supraventricular tachycardia) ([place])   . Thrombocytopenia ([place]) 08/21/2016   Past Surgical History:  Procedure Laterality Date  . BIOPSY  12/14/2019   Procedure: BIOPSY;  Surgeon: Mauri Pole, MD;  Location: [facility];  Service: Endoscopy;;  . COLONOSCOPY WITH PROPOFOL N/A 12/14/2019   Procedure: COLONOSCOPY WITH PROPOFOL;  Surgeon: Mauri Pole, MD;  Location: [facility];  Service: Endoscopy;  Laterality: N/A;  . ESOPHAGOGASTRODUODENOSCOPY (EGD) WITH PROPOFOL N/A 12/14/2019   Procedure: ESOPHAGOGASTRODUODENOSCOPY (EGD) WITH PROPOFOL;  Surgeon: Mauri Pole, MD;  Location: [facility];  Service: Endoscopy;  Laterality: N/A;  . LAPAROSCOPIC SIGMOID COLECTOMY  2007  . POLYPECTOMY  12/14/2019   Procedure: POLYPECTOMY;  Surgeon: Mauri Pole, MD;  Location: [facility];  Service: Endoscopy;;  reports that he has been smoking. He has been smoking about 1.00 pack per day. He has never used smokeless tobacco. He reports previous alcohol use of about 2.0 standard drinks of alcohol per week. He reports current drug use. Drug: Marijuana. family history includes Cancer in his sister; Colon cancer in his cousin and father; Kidney disease in his sister. Allergies  Allergen  Reactions  . Aspirin Other (See Comments)    Acid reflux   . Penicillins Hives    Has patient had a PCN reaction causing immediate rash, facial/tongue/throat swelling, SOB or lightheadedness with hypotension: yes Has patient had a PCN reaction causing severe rash involving mucus membranes or skin necrosis: no Has patient had a PCN reaction that required hospitalization: no Has patient had a PCN reaction occurring within the last 10 years: no If all of the above answers are "NO", then may proceed with Cephalosporin use.       Outpatient Encounter Medications as of 08/18/2020  Medication Sig  . aspirin 81 MG EC tablet Take 1 tablet (81 mg total) by mouth daily.  . furosemide (LASIX) 40 MG tablet Take 1 tablet (40 mg total) by mouth daily.  Marland Kitchen gabapentin (NEURONTIN) 100 MG capsule One pill in the morning, one pill midday and 2 pills at night to help with nerve pain  . magnesium oxide (MAG-OX) 400 (241.3 Mg) MG tablet Take 1 tablet (400 mg total) by mouth daily.  . metoprolol succinate (TOPROL-XL) 25 MG 24 hr tablet Take 1 tablet (25 mg total) by mouth daily.  . pantoprazole (PROTONIX) 40 MG tablet Take 1 tablet (40 mg total) by mouth in the morning.  Marland Kitchen spironolactone (ALDACTONE) 100 MG tablet Take 1 tablet (100 mg total) by mouth daily.   No facility-administered encounter medications on file as of 08/18/2020.    REVIEW OF SYSTEMS  : All other systems reviewed and negative except where noted in the History of Present Illness.   PHYSICAL EXAM: BP 90/60 (BP Location: Left Arm, Patient Position: Sitting, Cuff Size: Normal)   Pulse 76   Ht 5\' 10"  (1.778 m)   Wt 120 lb 4 oz (54.5 kg)   BMI 17.25 kg/m  General: Well developed AA male in no acute distress Head: Normocephalic and atraumatic Eyes:  Sclerae anicteric, conjunctiva pink. Ears: Normal auditory acuity Lungs: Clear throughout to auscultation; nno W/R/R. Heart: Regular rate and rhythm; no M/R/G. Abdomen: Soft, non-distended.   BS present.  Non-tender.  No ascites noted. Musculoskeletal: Symmetrical with no gross deformities  Skin: No lesions on visible extremities Extremities: No edema  Neurological: Alert oriented x 4, grossly non-focal Psychological:  Alert and cooperative. Normal mood and affect  ASSESSMENT AND PLAN: *History of ETOH hepatitis/cirrhosis: Currently overall well compensated clinically and biochemically.  He does not have any significant ascites on exam today.  He is currently on Lasix 40 mg daily and Aldactone 100 mg daily.  Continue to avoid alcohol.  Will check CBC, CMP, AFP, PT/INR today since last was in August. *Alcoholic cardiomyopathy with systolic heart failure followed by cardiology *Multiple significant medical problems followed by Dr. Chapman Fitch *GERD: Symptoms well controlled on PPI *History of adenomatous colon polyps and personal remote history of colon cancer status post resection.  Repeat colonoscopy March 2026. *Intermittent abdominal pain: Likely spasm/cramping.  We will try Levsin as needed.  Prescription sent to pharmacy. *Postnasal drip/sinus drainage: I have advised that he could use some type of nasal spray such as Flonase or even saline.  Could try  some type of allergy medicine such as Zyrtec, Allegra, Claritin, Xyzal.  Otherwise needs to see PCP for this issue.   CC:  Antony Blackbird, MD

## 2020-08-19 LAB — AFP TUMOR MARKER: AFP-Tumor Marker: 2.5 ng/mL (ref <6.1)

## 2020-08-20 ENCOUNTER — Telehealth: Payer: Self-pay | Admitting: Gastroenterology

## 2020-08-20 NOTE — Telephone Encounter (Signed)
See results note 11/18

## 2020-08-20 NOTE — Telephone Encounter (Signed)
Pt is requesting a call back from a nurse in regards to his labs (missed call)

## 2020-08-21 ENCOUNTER — Encounter: Payer: Self-pay | Admitting: Family Medicine

## 2020-08-21 ENCOUNTER — Other Ambulatory Visit: Payer: Self-pay

## 2020-08-21 ENCOUNTER — Ambulatory Visit: Payer: MEDICAID | Attending: Family Medicine | Admitting: Family Medicine

## 2020-08-21 VITALS — BP 115/70 | HR 67 | Wt 118.6 lb

## 2020-08-21 DIAGNOSIS — K7030 Alcoholic cirrhosis of liver without ascites: Secondary | ICD-10-CM

## 2020-08-21 DIAGNOSIS — I420 Dilated cardiomyopathy: Secondary | ICD-10-CM

## 2020-08-21 DIAGNOSIS — E871 Hypo-osmolality and hyponatremia: Secondary | ICD-10-CM

## 2020-08-21 DIAGNOSIS — G629 Polyneuropathy, unspecified: Secondary | ICD-10-CM

## 2020-08-21 DIAGNOSIS — K7460 Unspecified cirrhosis of liver: Secondary | ICD-10-CM | POA: Insufficient documentation

## 2020-08-21 DIAGNOSIS — M19041 Primary osteoarthritis, right hand: Secondary | ICD-10-CM

## 2020-08-21 DIAGNOSIS — M19042 Primary osteoarthritis, left hand: Secondary | ICD-10-CM

## 2020-08-21 MED ORDER — GABAPENTIN 300 MG PO CAPS: 300.0000 mg | ORAL_CAPSULE | Freq: Two times a day (BID) | ORAL | 3 refills | Status: DC

## 2020-08-21 MED ORDER — DICLOFENAC SODIUM 1 % EX GEL: 2.0000 g | Freq: Four times a day (QID) | CUTANEOUS | 1 refills | Status: DC

## 2020-08-21 MED FILL — GABAPENTIN 300 MG CAPSULE: 300 | 30 days supply | Qty: 60 | Fill #0

## 2020-08-21 MED FILL — PANTOPRAZOLE SOD DR 40 MG T: 40 | 30 days supply | Qty: 30 | Fill #0

## 2020-08-21 MED FILL — ?SPIRONOLACTONE 100MG TAB: 100 | 30 days supply | Qty: 30 | Fill #5

## 2020-08-21 MED FILL — FUROSEMIDE 40 MG TAB: 40 | 30 days supply | Qty: 30 | Fill #2

## 2020-08-21 MED FILL — METOPROLOL SUCCINATE ER 25: 25 | 30 days supply | Qty: 30 | Fill #0

## 2020-08-21 MED FILL — DICLOFENAC SODIUM 1% GEL: 1 | 12 days supply | Qty: 100 | Fill #0

## 2020-08-21 NOTE — Patient Instructions (Signed)
Neuropathic Pain Neuropathic pain is pain caused by damage to the nerves that are responsible for certain sensations in your body (sensory nerves). The pain can be caused by:  Damage to the sensory nerves that send signals to your spinal cord and brain (peripheral nervous system).  Damage to the sensory nerves in your brain or spinal cord (central nervous system). Neuropathic pain can make you more sensitive to pain. Even a minor sensation can feel very painful. This is usually a long-term condition that can be difficult to treat. The type of pain differs from person to person. It may:  Start suddenly (acute), or it may develop slowly and last for a long time (chronic).  Come and go as damaged nerves heal, or it may stay at the same level for years.  Cause emotional distress, loss of sleep, and a lower quality of life. What are the causes? The most common cause of this condition is diabetes. Many other diseases and conditions can also cause neuropathic pain. Causes of neuropathic pain can be classified as:  Toxic. This is caused by medicines and chemicals. The most common cause of toxic neuropathic pain is damage from cancer treatments (chemotherapy).  Metabolic. This can be caused by: ? Diabetes. This is the most common disease that damages the nerves. ? Lack of vitamin B from long-term alcohol abuse.  Traumatic. Any injury that cuts, crushes, or stretches a nerve can cause damage and pain. A common example is feeling pain after losing an arm or leg (phantom limb pain).  Compression-related. If a sensory nerve gets trapped or compressed for a long period of time, the blood supply to the nerve can be cut off.  Vascular. Many blood vessel diseases can cause neuropathic pain by decreasing blood supply and oxygen to nerves.  Autoimmune. This type of pain results from diseases in which the body's defense system (immune system) mistakenly attacks sensory nerves. Examples of autoimmune diseases  that can cause neuropathic pain include lupus and multiple sclerosis.  Infectious. Many types of viral infections can damage sensory nerves and cause pain. Shingles infection is a common cause of this type of pain.  Inherited. Neuropathic pain can be a symptom of many diseases that are passed down through families (genetic). What increases the risk? You are more likely to develop this condition if:  You have diabetes.  You smoke.  You drink too much alcohol.  You are taking certain medicines, including medicines that kill cancer cells (chemotherapy) or that treat immune system disorders. What are the signs or symptoms? The main symptom is pain. Neuropathic pain is often described as:  Burning.  Shock-like.  Stinging.  Hot or cold.  Itching. How is this diagnosed? No single test can diagnose neuropathic pain. It is diagnosed based on:  Physical exam and your symptoms. Your health care provider will ask you about your pain. You may be asked to use a pain scale to describe how bad your pain is.  Tests. These may be done to see if you have a high sensitivity to pain and to help find the cause and location of any sensory nerve damage. They include: ? Nerve conduction studies to test how well nerve signals travel through your sensory nerves (electrodiagnostic testing). ? Stimulating your sensory nerves through electrodes on your skin and measuring the response in your spinal cord and brain (somatosensory evoked potential).  Imaging studies, such as: ? X-rays. ? CT scan. ? MRI. How is this treated? Treatment for neuropathic pain may change   over time. You may need to try different treatment options or a combination of treatments. Some options include:  Treating the underlying cause of the neuropathy, such as diabetes, kidney disease, or vitamin deficiencies.  Stopping medicines that can cause neuropathy, such as chemotherapy.  Medicine to relieve pain. Medicines may  include: ? Prescription or over-the-counter pain medicine. ? Anti-seizure medicine. ? Antidepressant medicines. ? Pain-relieving patches that are applied to painful areas of skin. ? A medicine to numb the area (local anesthetic), which can be injected as a nerve block.  Transcutaneous nerve stimulation. This uses electrical currents to block painful nerve signals. The treatment is painless.  Alternative treatments, such as: ? Acupuncture. ? Meditation. ? Massage. ? Physical therapy. ? Pain management programs. ? Counseling. Follow these instructions at home: Medicines   Take over-the-counter and prescription medicines only as told by your health care provider.  Do not drive or use heavy machinery while taking prescription pain medicine.  If you are taking prescription pain medicine, take actions to prevent or treat constipation. Your health care provider may recommend that you: ? Drink enough fluid to keep your urine pale yellow. ? Eat foods that are high in fiber, such as fresh fruits and vegetables, whole grains, and beans. ? Limit foods that are high in fat and processed sugars, such as fried or sweet foods. ? Take an over-the-counter or prescription medicine for constipation. Lifestyle   Have a good support system at home.  Consider joining a chronic pain support group.  Do not use any products that contain nicotine or tobacco, such as cigarettes and e-cigarettes. If you need help quitting, ask your health care provider.  Do not drink alcohol. General instructions  Learn as much as you can about your condition.  Work closely with all your health care providers to find the treatment plan that works best for you.  Ask your health care provider what activities are safe for you.  Keep all follow-up visits as told by your health care provider. This is important. Contact a health care provider if:  Your pain treatments are not working.  You are having side effects  from your medicines.  You are struggling with tiredness (fatigue), mood changes, depression, or anxiety. Summary  Neuropathic pain is pain caused by damage to the nerves that are responsible for certain sensations in your body (sensory nerves).  Neuropathic pain may come and go as damaged nerves heal, or it may stay at the same level for years.  Neuropathic pain is usually a long-term condition that can be difficult to treat. Consider joining a chronic pain support group. This information is not intended to replace advice given to you by your health care provider. Make sure you discuss any questions you have with your health care provider. Document Revised: 01/10/2019 Document Reviewed: 10/06/2017 [employer] Patient Education  2020 [employer]  

## 2020-08-21 NOTE — Progress Notes (Signed)
Subjective:  Patient ID: Richard [name], male    DOB: 1966/07/26  Age: 54 y.o. MRN: [id number]  CC: Follow-up   HPI Richard [name] is a 54 year old male patient of Dr. Chapman Fitch with a history of peptic ulcer disease, alcoholic liver cirrhosis, GERD, SVT, dilated cardiomyopathy (EF 60-65 %), Depression here for chronic disease management. Most recent paracentesis was in 06/09/2020. Seen by GI on 08/18/2020. Visit with cardiology was on 08/10/2020 with no regimen change.  Comlains of burning sensation in feet.  Currently on gabapentin with a total daily dose of 400 mg but this has been ineffective for the patient. Complains of hands being stiff more in the mornings. Denies presence of chest pain, dyspnea, pedal edema.  Past Medical History:  Diagnosis Date  . Abnormal nuclear cardiac imaging test   . Acid reflux   . Acute pancreatitis 08/14/2018  . Alcohol withdrawal delirium ([place]) 08/20/2016  . Alcoholic cardiomyopathy ([place]) 01/07/2020  . Alcoholic hepatitis   . Alcoholic ketoacidosis 3/74/8270  . Ascites 12/13/2019  . Aspiration pneumonia ([place]) 08/20/2016  . Chest pain of uncertain etiology   . Chronic systolic CHF (congestive heart failure) ([place])   . Cirrhosis ([place])   . Colon cancer ([place])   . DCM (dilated cardiomyopathy) ([place])   . Drug abuse ([place]) 01/07/2020  . Elevated troponin 06/27/2018  . ETOH abuse   . 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 Heme positive stool 11/13/2017  . Hepatic steatosis 08/21/2016  . High anion gap metabolic acidosis 04/09/6753  . History of colon cancer   . HTN (hypertension)   . Hypertensive urgency 06/27/2018  . Hypoglycemia 06/27/2018  . Hypokalemia 01/07/2020  . Hypomagnesemia   . Hypophosphatemia   . Lactic acidosis 08/20/2016  . Leukocytosis 01/07/2020  . Pancreatitis 08/2018  . Polyp of ascending colon   . Prolonged Q-T interval on ECG   . Prolonged QT interval   . Protein-calorie malnutrition, severe 05/02/2018  . PUD (peptic ulcer disease)   . SBP  (spontaneous bacterial peritonitis) ([place]) 01/07/2020  . Sepsis ([place]) 08/20/2016  . 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 SVT (supraventricular tachycardia) ([place])   . Thrombocytopenia ([place]) 08/21/2016    Past Surgical History:  Procedure Laterality Date  . BIOPSY  12/14/2019   Procedure: BIOPSY;  Surgeon: Mauri Pole, MD;  Location: [facility];  Service: Endoscopy;;  . COLONOSCOPY WITH PROPOFOL N/A 12/14/2019   Procedure: COLONOSCOPY WITH PROPOFOL;  Surgeon: Mauri Pole, MD;  Location: [facility];  Service: Endoscopy;  Laterality: N/A;  . ESOPHAGOGASTRODUODENOSCOPY (EGD) WITH PROPOFOL N/A 12/14/2019   Procedure: ESOPHAGOGASTRODUODENOSCOPY (EGD) WITH PROPOFOL;  Surgeon: Mauri Pole, MD;  Location: [facility];  Service: Endoscopy;  Laterality: N/A;  . LAPAROSCOPIC SIGMOID COLECTOMY  2007  . POLYPECTOMY  12/14/2019   Procedure: POLYPECTOMY;  Surgeon: Mauri Pole, MD;  Location: [facility];  Service: Endoscopy;;    Family History  Problem Relation Age of Onset  . Colon cancer Father   . Cancer Sister        type unknown  . Kidney disease Sister        dialysis  . Colon cancer Cousin        x 2  . CAD Neg Hx   . Stroke Neg Hx   . Diabetes Neg Hx     Allergies  Allergen Reactions  . Aspirin Other (See Comments)    Acid reflux   . Penicillins Hives    Has patient had a PCN reaction causing immediate  rash, facial/tongue/throat swelling, SOB or lightheadedness with hypotension: yes Has patient had a PCN reaction causing severe rash involving mucus membranes or skin necrosis: no Has patient had a PCN reaction that required hospitalization: no Has patient had a PCN reaction occurring within the last 10 years: no If all of the above answers are "NO", then may proceed with Cephalosporin use.     Outpatient Medications Prior to Visit  Medication Sig Dispense Refill  . aspirin 81 MG EC tablet Take 1 tablet (81 mg total) by mouth daily. 90 tablet 3  .  furosemide (LASIX) 40 MG tablet Take 1 tablet (40 mg total) by mouth daily. 90 tablet 1  . hyoscyamine (LEVSIN SL) 0.125 MG SL tablet Place 1 tablet (0.125 mg total) under the tongue every 8 (eight) hours as needed. 30 tablet 1  . magnesium oxide (MAG-OX) 400 (241.3 Mg) MG tablet Take 1 tablet (400 mg total) by mouth daily. 100 tablet 3  . metoprolol succinate (TOPROL-XL) 25 MG 24 hr tablet Take 1 tablet (25 mg total) by mouth daily. 90 tablet 1  . pantoprazole (PROTONIX) 40 MG tablet Take 1 tablet (40 mg total) by mouth in the morning. 90 tablet 3  . spironolactone (ALDACTONE) 100 MG tablet Take 1 tablet (100 mg total) by mouth daily. 90 tablet 1  . gabapentin (NEURONTIN) 100 MG capsule One pill in the morning, one pill midday and 2 pills at night to help with nerve pain 120 capsule 5   No facility-administered medications prior to visit.     ROS Review of Systems  Constitutional: Negative for activity change and appetite change.  HENT: Negative for sinus pressure and sore throat.   Eyes: Negative for visual disturbance.  Respiratory: Negative for cough, chest tightness and shortness of breath.   Cardiovascular: Negative for chest pain and leg swelling.  Gastrointestinal: Negative for abdominal distention, abdominal pain, constipation and diarrhea.  Endocrine: Negative.   Genitourinary: Negative for dysuria.  Musculoskeletal:       See HPI  Skin: Negative for rash.  Allergic/Immunologic: Negative.   Neurological: Negative for weakness, light-headedness and numbness.  Psychiatric/Behavioral: Negative for dysphoric mood and suicidal ideas.    Objective:  BP 115/70 (BP Location: Left Arm, Patient Position: Sitting)   Pulse 67   Wt 118 lb 9.6 oz (53.8 kg)   SpO2 99%   BMI 17.02 kg/m   BP/Weight 08/21/2020 08/18/2020 19/12/7900  Systolic BP 409 90 735  Diastolic BP 70 60 58  Wt. (Lbs) 118.6 120.25 121.8  BMI 17.02 17.25 16.52      Physical Exam Constitutional:       Appearance: He is well-developed.  Neck:     Vascular: No JVD.  Cardiovascular:     Rate and Rhythm: Normal rate.     Heart sounds: Normal heart sounds. No murmur heard.   Pulmonary:     Effort: Pulmonary effort is normal.     Breath sounds: Normal breath sounds. No wheezing or rales.  Chest:     Chest wall: No tenderness.  Abdominal:     General: Bowel sounds are normal. There is no distension.     Palpations: Abdomen is soft. There is no mass.     Tenderness: There is no abdominal tenderness.  Musculoskeletal:     Right lower leg: No edema.     Left lower leg: No edema.     Comments: Slight deformity of right middle finger with restricted range of motion  Neurological:  Mental Status: He is alert and oriented to person, place, and time.  Psychiatric:        Mood and Affect: Mood normal.     CMP Latest Ref Rng & Units 08/18/2020 06/02/2020 04/09/2020  Glucose 70 - 99 mg/dL 84 88 94  BUN 6 - 23 mg/dL 11 11 11   Creatinine 0.40 - 1.50 mg/dL 1.00 0.89 0.92  Sodium 135 - 145 mEq/L 124(L) 131(L) 124(L)  Potassium 3.5 - 5.1 mEq/L 4.2 4.1 3.6  Chloride 96 - 112 mEq/L 93(L) 98 89(L)  CO2 19 - 32 mEq/L 22 20 25   Calcium 8.4 - 10.5 mg/dL 10.1 9.7 10.0  Total Protein 6.0 - 8.3 g/dL 8.6(H) 7.5 8.2  Total Bilirubin 0.2 - 1.2 mg/dL 1.0 0.9 1.7(H)  Alkaline Phos 39 - 117 U/L 127(H) 126(H) 95  AST 0 - 37 U/L 34 32 28  ALT 0 - 53 U/L 15 15 17     Lipid Panel     Component Value Date/Time   CHOL 134 01/11/2020 0640   TRIG 149 01/11/2020 0640   HDL <10 (L) 01/11/2020 0640   CHOLHDL NOT CALCULATED 01/11/2020 0640   VLDL 30 01/11/2020 0640   LDLCALC NOT CALCULATED 01/11/2020 0640    CBC    Component Value Date/Time   WBC 6.2 08/18/2020 0953   RBC 4.55 08/18/2020 0953   HGB 11.3 (L) 08/18/2020 0953   HGB 9.3 (L) 06/02/2020 0850   HGB 15.6 08/31/2007 0955   HCT 34.9 (L) 08/18/2020 0953   HCT 29.2 (L) 06/02/2020 0850   HCT 44.5 08/31/2007 0955   PLT 175.0 08/18/2020 0953    PLT 245 06/02/2020 0850   MCV 76.6 (L) 08/18/2020 0953   MCV 82 06/02/2020 0850   MCV 97.2 08/31/2007 0955   MCH 26.0 (L) 06/02/2020 0850   MCH 31.2 01/22/2020 1659   MCHC 32.5 08/18/2020 0953   RDW 17.6 (H) 08/18/2020 0953   RDW 15.8 (H) 06/02/2020 0850   RDW 13.0 08/31/2007 0955   LYMPHSABS 1.6 08/18/2020 0953   LYMPHSABS 1.0 08/21/2018 1038   LYMPHSABS 1.6 08/31/2007 0955   MONOABS 0.7 08/18/2020 0953   MONOABS 0.6 08/31/2007 0955   EOSABS 0.2 08/18/2020 0953   EOSABS 0.1 08/21/2018 1038   BASOSABS 0.1 08/18/2020 0953   BASOSABS 0.1 08/21/2018 1038   BASOSABS 0.0 08/31/2007 0955    Lab Results  Component Value Date   HGBA1C <4.2 (L) 01/11/2020    Assessment & Plan:  1. 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  Dispense: 60 capsule; Refill: 3  2. DCM (dilated cardiomyopathy) (HCC) EF of 60-65 % Likely alcohol-related Euvolemic  3. Alcoholic cirrhosis of 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) Stable Continue with spironolactone, Protonix  4. Primary osteoarthritis of both hands Placed on Voltaren gel    Meds ordered this encounter  Medications  . gabapentin (NEURONTIN) 300 MG capsule    Sig: Take 1 capsule (300 mg total) by mouth 2 (two) times daily.    Dispense:  60 capsule    Refill:  3  . diclofenac Sodium (VOLTAREN) 1 % GEL    Sig: Apply 2 g topically 4 (four) times daily.    Dispense:  100 g    Refill:  1    Follow-up: Return in about 3 months (around 11/21/2020) for chronic disease management.       Charlott Rakes, MD, FAAFP. [facility] [place], [place]   08/21/2020, 2:52 PM

## 2020-08-21 NOTE — Progress Notes (Signed)
Pt expereincing shooting pain from feet to legs  Burning sensation at bottom of both feet  Needs refills on all medications

## 2020-08-24 ENCOUNTER — Ambulatory Visit: Payer: Self-pay

## 2020-08-24 NOTE — Telephone Encounter (Signed)
   Richard [name] Male, 54 y.o., 11-Dec-1965 MRN:  [id number] Phone:  [phone] Jerilynn Mages) PCP:  Charlott Rakes, MD Coverage:  Medicaid Pending/Medicaid Pending Next Appt With Cardiology 09/23/2020 at 1:00 PM Message from Luciana Axe sent at 08/24/2020 3:10 PM EST  Patient later stated that the cardiologist prescribed the medication.  ----- Message from Luciana Axe sent at 08/24/2020 3:07 PM EST -----  Patient is calling to report to he has been take duloxetine for three daysp medication it was prescribed by Dr. Chapman Fitch. And it making him sick.   Call History   Type Contact Phone User  08/24/2020 03:05 PM EST Phone (Incoming) [name], Richard Velardi (Self) [phone] Lemmie Evens) Blase Mess A   Pt. Reports cardiology doctor " put me on duloxetine and it made vomit real bad." Has stopped the medication. Instructed to call his cardiologist, since he prescribed the medication. Verbalizes understanding. Answer Assessment - Initial Assessment Questions 1. NAME of MEDICATION: "What medicine are you calling about?"     Duloxetine 2. QUESTION: "What is your question?" (e.g., medication refill, side effect)     Side effect 3. PRESCRIBING HCP: "Who prescribed it?" Reason: if prescribed by specialist, call should be referred to that group.     Cardiology doctor 4. SYMPTOMS: "Do you have any symptoms?"     Vomiting 5. SEVERITY: If symptoms are present, ask "Are they mild, moderate or severe?"     Moderate 6. PREGNANCY:  "Is there any chance that you are pregnant?" "When was your last menstrual period?"     n/a  Protocols used: MEDICATION QUESTION CALL-A-AH

## 2020-08-25 ENCOUNTER — Telehealth: Payer: Self-pay

## 2020-08-25 ENCOUNTER — Encounter: Payer: Self-pay | Admitting: Gastroenterology

## 2020-08-25 DIAGNOSIS — F1011 Alcohol abuse, in remission: Secondary | ICD-10-CM | POA: Insufficient documentation

## 2020-08-25 NOTE — Telephone Encounter (Signed)
-----   Message from Loralie Champagne, PA-C sent at 08/25/2020  9:32 AM EST ----- Patient did not come for repeat labs yesterday.

## 2020-08-25 NOTE — Telephone Encounter (Signed)
Left message on machine to call back  

## 2020-08-25 NOTE — Telephone Encounter (Signed)
He was supposed to come yesterday.  Can he come tomorrow?

## 2020-08-25 NOTE — Progress Notes (Signed)
PA Evaluation from one week ago noted

## 2020-08-25 NOTE — Telephone Encounter (Signed)
I spoke with the pt and he will come in on Monday for the lab test.

## 2020-08-26 NOTE — Telephone Encounter (Signed)
I asked him too and he told me that he could not get here until Monday.

## 2020-09-01 ENCOUNTER — Other Ambulatory Visit: Payer: Self-pay | Admitting: Family Medicine

## 2020-09-01 ENCOUNTER — Other Ambulatory Visit (INDEPENDENT_AMBULATORY_CARE_PROVIDER_SITE_OTHER): Payer: Self-pay

## 2020-09-01 ENCOUNTER — Telehealth: Payer: Self-pay | Admitting: Family Medicine

## 2020-09-01 DIAGNOSIS — E871 Hypo-osmolality and hyponatremia: Secondary | ICD-10-CM

## 2020-09-01 LAB — BASIC METABOLIC PANEL
BUN: 9 mg/dL (ref 6–23)
CO2: 22 mEq/L (ref 19–32)
Calcium: 9.8 mg/dL (ref 8.4–10.5)
Chloride: 95 mEq/L — ABNORMAL LOW (ref 96–112)
Creatinine, Ser: 0.8 mg/dL (ref 0.40–1.50)
GFR: 101.58 mL/min (ref >60.00)
Glucose, Bld: 102 mg/dL — ABNORMAL HIGH (ref 70–99)
Potassium: 3.7 mEq/L (ref 3.5–5.1)
Sodium: 127 mEq/L — ABNORMAL LOW (ref 135–145)

## 2020-09-01 MED FILL — LACTULOSE 10 GM/15 ML SOLN: 10 | 23 days supply | Qty: 1419 | Fill #1

## 2020-09-01 NOTE — Telephone Encounter (Signed)
Pt states he got all his meds except  lactulose (CHRONULAC) 10 GM/15ML solution   [address], [place]. [employer] Phone:  [phone]  Fax:  [phone]     Pt states he cannot go without this medication. He has chronic constipation.

## 2020-09-01 NOTE — Telephone Encounter (Signed)
Aware that refills were on the requested medication and was at pharmacy.

## 2020-09-01 NOTE — Telephone Encounter (Signed)
Not on current medication list. Patient states this is the only medication he didn't receive.

## 2020-09-01 NOTE — Telephone Encounter (Signed)
Pt states his disability request was sent in July and he was turned down due to insufficient information-they never got anything back. Pt doesn't know what to do, he states he can barely walk.  Would like to know if there is someone there that can help him?

## 2020-09-01 NOTE — Telephone Encounter (Signed)
Please follow up with pt

## 2020-09-02 ENCOUNTER — Telehealth: Payer: Self-pay | Admitting: Licensed Clinical Social Worker

## 2020-09-02 NOTE — Telephone Encounter (Signed)
Call placed to patient. Patient reports frustration with medicaid and disability denials. He reports difficulty managing medical conditions due to ongoing financial strain. LCSW discussed benefits of Legal Aid to which pt provided verbal consent to complete referral.   Pt reported needing assistance with filling prescription. He was informed that requested medication is ready at [facility]. Pt verbalized understanding and was appreciative for the assistance. No additional concerns noted.

## 2020-09-02 NOTE — Telephone Encounter (Signed)
Completed Legal Aid submitted via e-mail.

## 2020-09-02 NOTE — Telephone Encounter (Signed)
Completed Legal Aid referral submitted via e-mail

## 2020-09-08 NOTE — Telephone Encounter (Signed)
Completed [employer] application submitted via fax

## 2020-09-17 ENCOUNTER — Telehealth: Payer: Self-pay | Admitting: Family Medicine

## 2020-09-17 NOTE — Telephone Encounter (Signed)
Copied from [place] [id number]. Topic: General - Other >> Sep 16, 2020 10:34 AM [name] wrote: Reason for CRM: Patient called to request a referral for a bone specialist. He stated he needs a full body bone check.  Please call patient to schedule.  CB# [phone]

## 2020-09-17 NOTE — Telephone Encounter (Signed)
He can be scheduled for an office visit with any Clinician who has an opening for better understanding of his symptoms so appropriate test can be ordered.

## 2020-09-17 NOTE — Telephone Encounter (Signed)
Pt is requesting a bone density scan.

## 2020-09-18 NOTE — Telephone Encounter (Signed)
Called pt to sched appt with any provider in January available for bone specialist referral. NO ANSWER.

## 2020-09-18 NOTE — Telephone Encounter (Signed)
Please make pt an appointment with any available provider.

## 2020-09-22 IMAGING — US US PARACENTESIS
1 series · 5 of 5 positions shown · non-contrast
Comparison: none

INDICATION: Patient with history of alcoholic hepatitis, CHF, ascites. Request
made for therapeutic paracentesis.

[Series 1: us paracentesis · 5 of 5 slices shown]
[im 1/5]
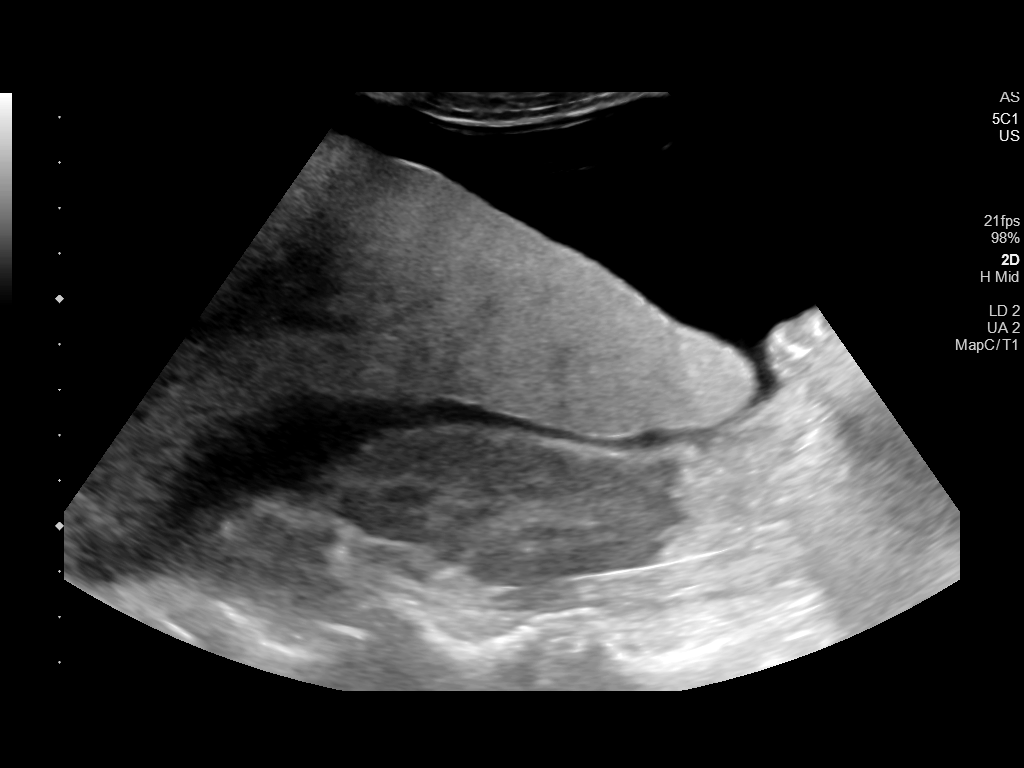
[im 2/5]
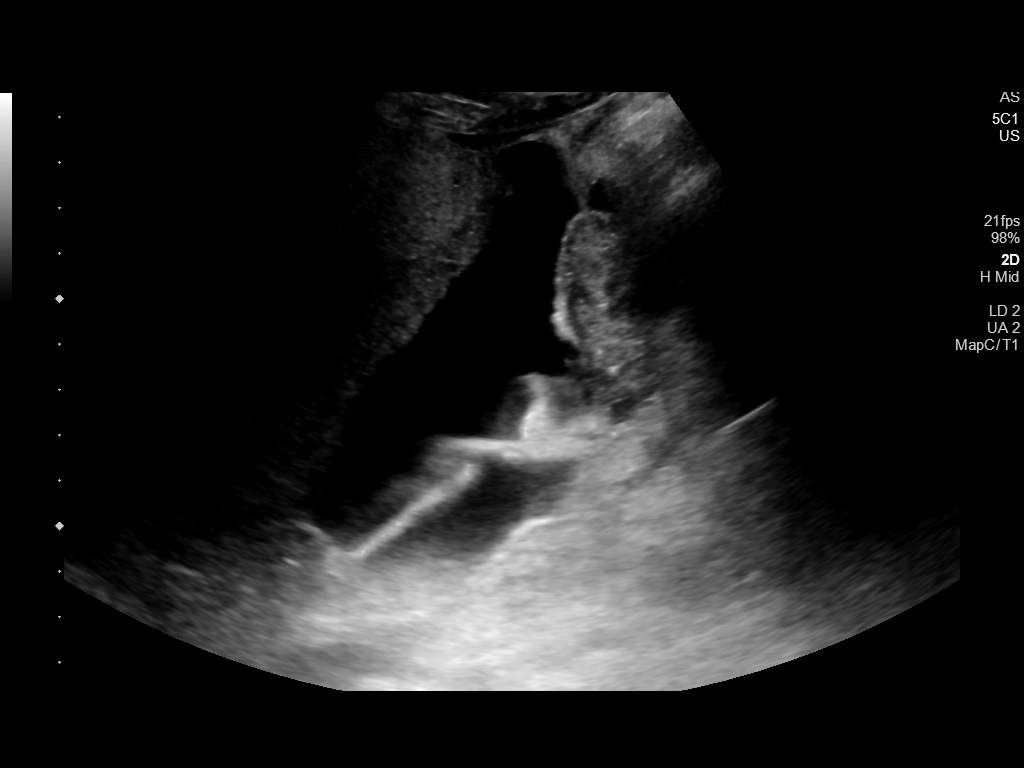
[im 3/5]
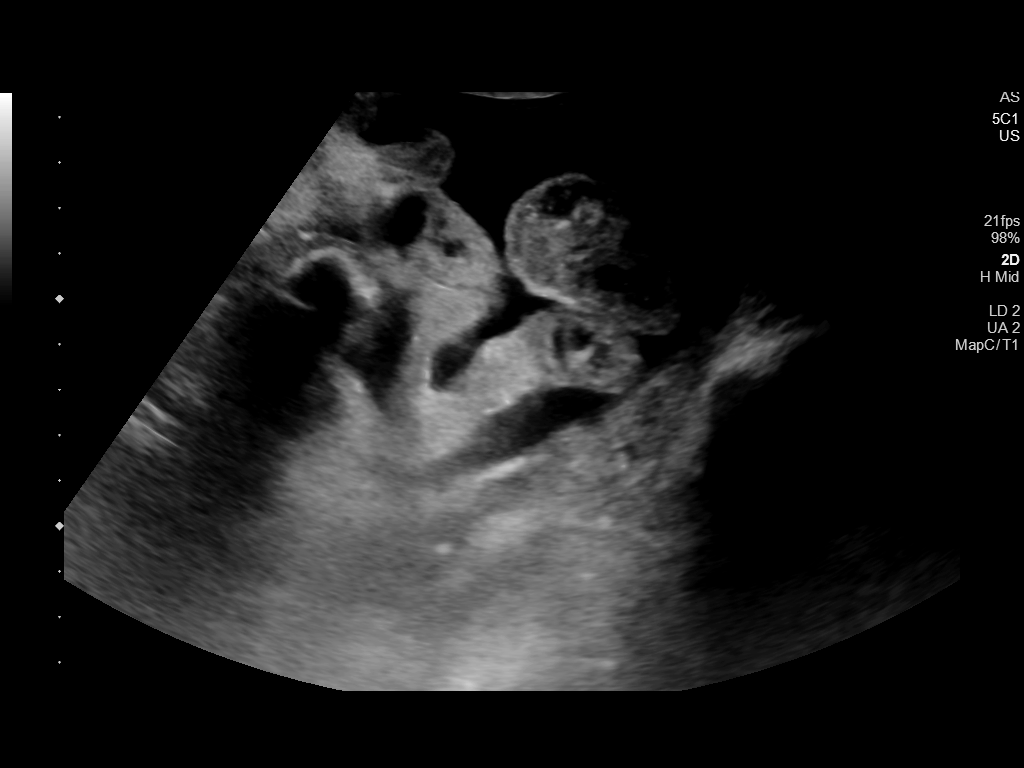
[im 4/5]
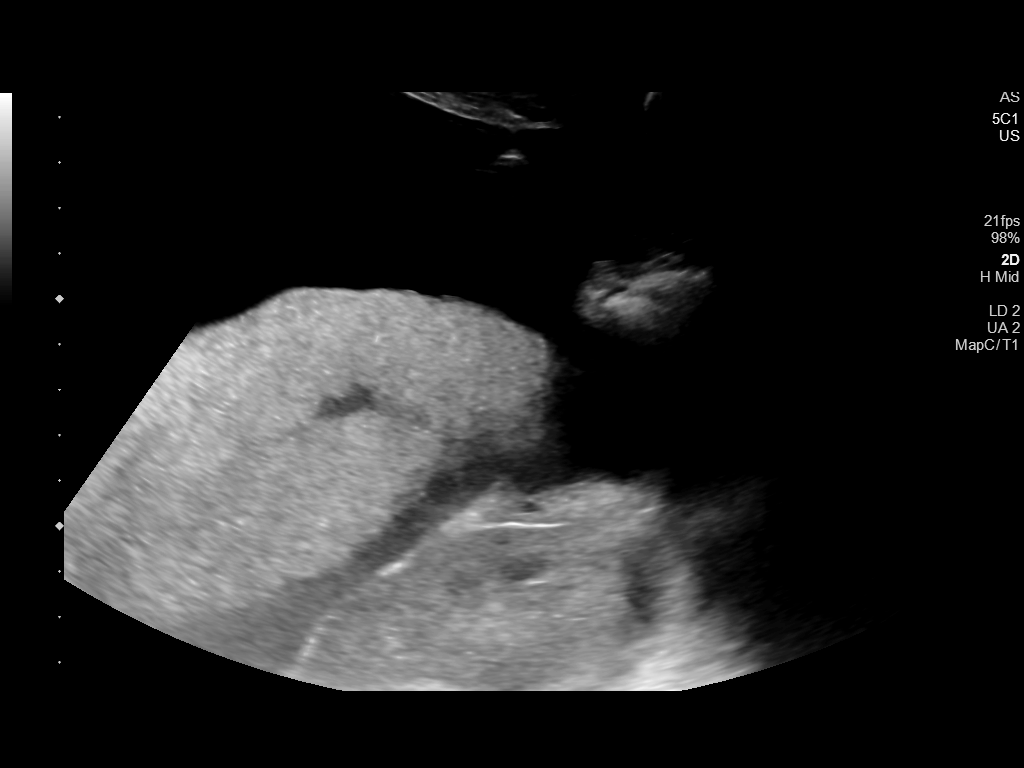
[im 5/5]
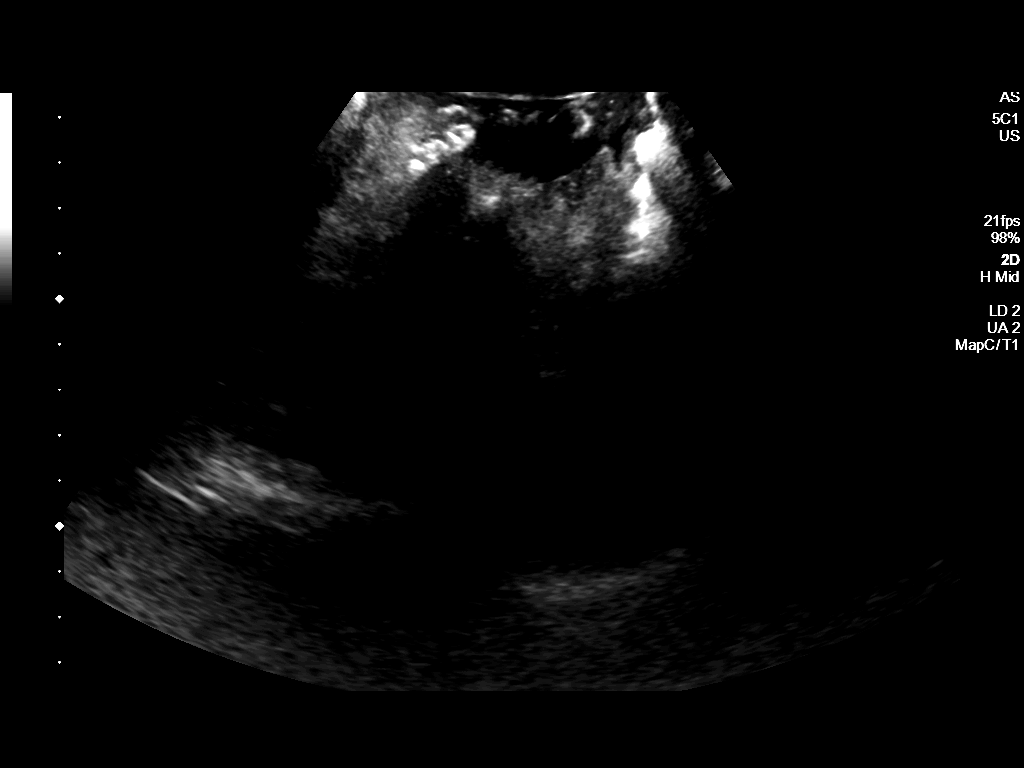

[5 of 5 positions shown; findings below may reference images not displayed]

EXAM:
ULTRASOUND GUIDED THERAPEUTIC PARACENTESIS

MEDICATIONS:
None

COMPLICATIONS:
None immediate.

PROCEDURE:
Informed written consent was obtained from the patient after a
discussion of the risks, benefits and alternatives to treatment. A
timeout was performed prior to the initiation of the procedure.

Initial ultrasound scanning demonstrates a moderate amount of
ascites within the right lower abdominal quadrant. The right lower
abdomen was prepped and draped in the usual sterile fashion. 1%
lidocaine was used for local anesthesia.

Following this, a 19 gauge, 7-cm, Yueh catheter was introduced. An
ultrasound image was saved for documentation purposes. The
paracentesis was performed. The catheter was removed and a dressing
was applied. The patient tolerated the procedure well without
immediate post procedural complication.
Patient received post-procedure intravenous albumin; see nursing
notes for details.
FINDINGS: A total of approximately 3.1 liters of yellow fluid was removed.
IMPRESSION: Successful ultrasound-guided therapeutic paracentesis yielding
liters of peritoneal fluid.

## 2020-09-23 ENCOUNTER — Ambulatory Visit (INDEPENDENT_AMBULATORY_CARE_PROVIDER_SITE_OTHER): Payer: Self-pay | Admitting: Vascular Surgery

## 2020-09-23 ENCOUNTER — Other Ambulatory Visit: Payer: Self-pay

## 2020-09-23 ENCOUNTER — Encounter: Payer: Self-pay | Admitting: Vascular Surgery

## 2020-09-23 ENCOUNTER — Ambulatory Visit (HOSPITAL_COMMUNITY)
Admission: RE | Admit: 2020-09-23 | Discharge: 2020-09-23 | Disposition: A | Discharge status: Home / Self Care | Payer: Medicaid Other | Source: Ambulatory Visit | Attending: Vascular Surgery | Admitting: Vascular Surgery

## 2020-09-23 VITALS — BP 132/71 | HR 75 | Temp 98.7°F | Resp 20 | Ht 70.0 in | Wt 118.0 lb

## 2020-09-23 DIAGNOSIS — I739 Peripheral vascular disease, unspecified: Secondary | ICD-10-CM

## 2020-09-23 NOTE — Progress Notes (Signed)
REASON FOR CONSULT:    To evaluate for peripheral vascular disease.  The consult is requested by Dr. Jillyn [name].  ASSESSMENT & PLAN:   PERIPHERAL VASCULAR DISEASE: This patient may have some mild underlying peripheral vascular disease but I think the majority of his symptoms are related to cramps or possibly neuropathy.  In addition he may have some degenerative disc disease of his back as he does have some back pain with some radiation of the pain to his legs bilaterally.  Regardless we have discussed the importance of tobacco cessation (3 min).  I have encouraged him to stay as active as possible.  I will be happy to see him back at any time if any new vascular issues arise.  Richard Ferrari, MD Office: [phone]   HPI:   Richard [name] is a pleasant 54 y.o. male, who is referred for evaluation of peripheral vascular disease.  I have reviewed the records from the referring office.  The patient had a virtual visit at the [facility] on 07/10/2020.  The patient was complaining of bilateral leg pain which is worse when he is walking.  The legs felt tired.  He also has a history of a back injury with some pain in his back radiating to both legs.  He was sent for peripheral vascular evaluation.  On my history, the patient describes significant cramping in his legs at night mostly around his ankle on the right side.  He also describes some pain in his toes and tingling consistent with neuropathy.  He had a crush injury to his foot approximately 4 years ago according to the patient.  I do not get any clear-cut history of claudication or rest pain.  He denies any history of nonhealing ulcers.  His risk factors for peripheral vascular disease include tobacco use.  He smokes a pack per day of cigarettes and has been smoking for well over 30 years.  He also has hypertension.  He denies any history of diabetes, hypercholesterolemia, or family history of premature  cardiovascular disease.  Past Medical History:  Diagnosis Date  . Abnormal nuclear cardiac imaging test   . Acid reflux   . Acute pancreatitis 08/14/2018  . Alcohol withdrawal delirium (HCC) 08/20/2016  . 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 Alcoholic hepatitis   . Alcoholic ketoacidosis 11/13/2017  . Ascites 12/13/2019  . Aspiration pneumonia (HCC) 08/20/2016  . Chest pain of uncertain etiology   . Chronic systolic CHF (congestive heart failure) (HCC)   . Cirrhosis (HCC)   . Colon cancer (HCC)   . DCM (dilated cardiomyopathy) (HCC)   . Drug abuse (HCC) 01/07/2020  . Elevated troponin 06/27/2018  . ETOH abuse   . 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 Heme positive stool 11/13/2017  . Hepatic steatosis 08/21/2016  . High anion gap metabolic acidosis 11/05/2018  . History of colon cancer   . HTN (hypertension)   . Hypertensive urgency 06/27/2018  . Hypoglycemia 06/27/2018  . Hypokalemia 01/07/2020  . Hypomagnesemia   . Hypophosphatemia   . Lactic acidosis 08/20/2016  . Leukocytosis 01/07/2020  . Pancreatitis 08/2018  . Polyp of ascending colon   . Prolonged Q-T interval on ECG   . Prolonged QT interval   . Protein-calorie malnutrition, severe 05/02/2018  . PUD (peptic ulcer disease)   . SBP (spontaneous bacterial peritonitis) (HCC) 01/07/2020  . Sepsis (HCC) 08/20/2016  . 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 SVT (supraventricular tachycardia) (HCC)   . Thrombocytopenia (HCC) 08/21/2016  Family History  Problem Relation Age of Onset  . Colon cancer Father   . Cancer Sister        type unknown  . Kidney disease Sister        dialysis  . Colon cancer Cousin        x 2  . CAD Neg Hx   . Stroke Neg Hx   . Diabetes Neg Hx     SOCIAL HISTORY: Social History   Socioeconomic History  . Marital status: Single    Spouse name: Not on file  . Number of children: Not on file  . Years of education: Not on file  . Highest education level: Not on file  Occupational History  . Not on  file  Tobacco Use  . Smoking status: Current Every Day Smoker    Packs/day: 1.00  . Smokeless tobacco: Never Used  Vaping Use  . Vaping Use: Never used  Substance and Sexual Activity  . Alcohol use: Not Currently    Alcohol/week: 2.0 standard drinks    Types: 1 Cans of beer, 1 Shots of liquor per week    Comment: last drink in February  . Drug use: Yes    Types: Marijuana  . Sexual activity: Not Currently  Other Topics Concern  . Not on file  Social History Narrative  . Not on file   Social Determinants of Health   Financial Resource Strain: High Risk  . Difficulty of Paying Living Expenses: Very hard  Food Insecurity: Food Insecurity Present  . Worried About Charity fundraiser in the Last Year: Often true  . Ran Out of Food in the Last Year: Often true  Transportation Needs: Unmet Transportation Needs  . Lack of Transportation (Medical): Yes  . Lack of Transportation (Non-Medical): Yes  Physical Activity: Not on file  Stress: Not on file  Social Connections: Not on file  Intimate Partner Violence: Not on file    Allergies  Allergen Reactions  . Aspirin Other (See Comments)    Acid reflux   . Penicillins Hives    Has patient had a PCN reaction causing immediate rash, facial/tongue/throat swelling, SOB or lightheadedness with hypotension: yes Has patient had a PCN reaction causing severe rash involving mucus membranes or skin necrosis: no Has patient had a PCN reaction that required hospitalization: no Has patient had a PCN reaction occurring within the last 10 years: no If all of the above answers are "NO", then may proceed with Cephalosporin use.     Current Outpatient Medications  Medication Sig Dispense Refill  . aspirin 81 MG EC tablet Take 1 tablet (81 mg total) by mouth daily. 90 tablet 3  . diclofenac Sodium (VOLTAREN) 1 % GEL Apply 2 g topically 4 (four) times daily. 100 g 1  . furosemide (LASIX) 40 MG tablet Take 1 tablet (40 mg total) by mouth daily. 90  tablet 1  . gabapentin (NEURONTIN) 300 MG capsule Take 1 capsule (300 mg total) by mouth 2 (two) times daily. 60 capsule 3  . hyoscyamine (LEVSIN SL) 0.125 MG SL tablet Place 1 tablet (0.125 mg total) under the tongue every 8 (eight) hours as needed. 30 tablet 1  . magnesium oxide (MAG-OX) 400 (241.3 Mg) MG tablet Take 1 tablet (400 mg total) by mouth daily. 100 tablet 3  . metoprolol succinate (TOPROL-XL) 25 MG 24 hr tablet Take 1 tablet (25 mg total) by mouth daily. 90 tablet 1  . pantoprazole (PROTONIX) 40 MG tablet Take 1  tablet (40 mg total) by mouth in the morning. 90 tablet 3  . spironolactone (ALDACTONE) 100 MG tablet Take 1 tablet (100 mg total) by mouth daily. 90 tablet 1   No current facility-administered medications for this visit.    REVIEW OF SYSTEMS:  [X]  denotes positive finding, [ ]  denotes negative finding Cardiac  Comments:  Chest pain or chest pressure:    Shortness of breath upon exertion: x   Short of breath when lying flat:    Irregular heart rhythm:        Vascular    Pain in calf, thigh, or hip brought on by ambulation:    Pain in feet at night that wakes you up from your sleep:     Blood clot in your veins:    Leg swelling:         Pulmonary    Oxygen at home:    Productive cough:     Wheezing:         Neurologic    Sudden weakness in arms or legs:     Sudden numbness in arms or legs:     Sudden onset of difficulty speaking or slurred speech:    Temporary loss of vision in one eye:     Problems with dizziness:         Gastrointestinal    Blood in stool:     Vomited blood:         Genitourinary    Burning when urinating:     Blood in urine:        Psychiatric    Major depression:         Hematologic    Bleeding problems:    Problems with blood clotting too easily:        Skin    Rashes or ulcers:        Constitutional    Fever or chills:     PHYSICAL EXAM:   Vitals:   09/23/20 1332  BP: 132/71  Pulse: 75  Resp: 20  Temp: 98.7  F (37.1 C)  SpO2: 95%  Weight: 118 lb (53.5 kg)  Height: 5\' 10"  (1.778 m)    GENERAL: The patient is a well-nourished male, in no acute distress. The vital signs are documented above. CARDIAC: There is a regular rate and rhythm.  VASCULAR: I do not detect carotid bruits. He has palpable femoral, popliteal, and pedal pulses bilaterally. He has no significant lower extremity swelling. He has no hyperpigmentation to suggest venous disease and no significant varicose veins. PULMONARY: There is good air exchange bilaterally without wheezing or rales. ABDOMEN: Soft and non-tender with normal pitched bowel sounds.  MUSCULOSKELETAL: There are no major deformities or cyanosis. NEUROLOGIC: No focal weakness or paresthesias are detected. SKIN: There are no ulcers or rashes noted. PSYCHIATRIC: The patient has a normal affect.  DATA:    ARTERIAL DOPPLER STUDY: I have independently interpreted his arterial Doppler study.  On the right side there is a triphasic dorsalis pedis and posterior tibial signal.  ABI is 100%.  Toe pressures 83 mmHg.  On the left side there is a triphasic dorsalis pedis and posterior tibial signal.  ABIs 100%.  Toe pressures 104 mmHg.

## 2020-09-24 ENCOUNTER — Telehealth: Payer: Self-pay | Admitting: Family Medicine

## 2020-09-24 NOTE — Telephone Encounter (Signed)
Copied from [place] #[id number]. Topic: General - Call Back - No Documentation >> Sep 23, 2020  4:51 PM Erick Blinks wrote: Best contact: [phone] or [phone]  Pt needs a call back from the office, regarding his scat transportation

## 2020-09-24 NOTE — Telephone Encounter (Signed)
Copied from [place] [phone]. Topic: Referral - Request for Referral >> Sep 23, 2020  4:47 PM Erick Blinks wrote: Has patient seen PCP for this complaint? Yes.   *If NO, is insurance requiring patient see PCP for this issue before PCP can refer them? Referral for which specialty: Ortho  Preferred provider/office: In [place] preferably  Reason for referral: Foot/Knee   Able to reach patient at the home phone number, Mobile number said "not accepting calls at this time". Called patient and scheduled for Jan 17th at 9:10 with [place] to get a better understanding for the need for a bone density scan. Patient wondered if he would also need to wait until this appointment for an Orthopedic referral as described above. Patient stated he has a history of injury to knee/foot.

## 2020-09-29 NOTE — Telephone Encounter (Signed)
Return call placed to patient. There was no answer or option to leave a message.

## 2020-10-05 ENCOUNTER — Other Ambulatory Visit: Payer: Self-pay | Admitting: Family Medicine

## 2020-10-05 DIAGNOSIS — G629 Polyneuropathy, unspecified: Secondary | ICD-10-CM

## 2020-10-05 DIAGNOSIS — I426 Alcoholic cardiomyopathy: Secondary | ICD-10-CM

## 2020-10-05 DIAGNOSIS — I5023 Acute on chronic systolic (congestive) heart failure: Secondary | ICD-10-CM

## 2020-10-05 MED ORDER — GABAPENTIN 300 MG PO CAPS: 300.0000 mg | ORAL_CAPSULE | Freq: Two times a day (BID) | ORAL | 0 refills | Status: DC

## 2020-10-05 MED ORDER — METOPROLOL SUCCINATE ER 25 MG PO TB24: 25.0000 mg | ORAL_TABLET | Freq: Every day | ORAL | 0 refills | Status: DC

## 2020-10-05 MED ORDER — PANTOPRAZOLE SODIUM 40 MG PO TBEC: 40.0000 mg | DELAYED_RELEASE_TABLET | Freq: Every morning | ORAL | 0 refills | Status: DC

## 2020-10-05 MED ORDER — DICLOFENAC SODIUM 1 % EX GEL: 2.0000 g | Freq: Four times a day (QID) | CUTANEOUS | 0 refills | Status: DC

## 2020-10-05 MED FILL — PANTOPRAZOLE SOD DR 40 MG T: 40 | 30 days supply | Qty: 30 | Fill #0

## 2020-10-05 MED FILL — DICLOFENAC SODIUM 1% GEL: 1 | 12 days supply | Qty: 100 | Fill #0

## 2020-10-05 MED FILL — GABAPENTIN 300 MG CAPSULE: 300 | 30 days supply | Qty: 60 | Fill #0

## 2020-10-05 MED FILL — METOPROLOL SUCCINATE ER 25: 25 | 30 days supply | Qty: 30 | Fill #0

## 2020-10-05 NOTE — Telephone Encounter (Signed)
Approved per protocol. Requested Prescriptions  Pending Prescriptions Disp Refills  . pantoprazole (PROTONIX) 40 MG tablet 90 tablet 0    Sig: Take 1 tablet (40 mg total) by mouth in the morning.     Gastroenterology: Proton Pump Inhibitors Passed - 10/05/2020 10:44 AM      Passed - Valid encounter within last 12 months    Recent Outpatient Visits          1 month ago DCM (dilated cardiomyopathy) (HCC)   [facility] [place], [place], MD   2 months ago Chronic right-sided low back pain with bilateral sciatica   [facility] [place], [place], MD   3 months ago No-show for appointment   [facility] [place], [place], NP   5 months ago Muscle cramps   [facility] Rema Fendt, NP   7 months ago Hospital discharge follow-up   [facility] [place], Salomon Fick, NP      Future Appointments            In 2 weeks Hoy Register, MD [facility]           . diclofenac Sodium (VOLTAREN) 1 % GEL 100 g 0    Sig: Apply 2 g topically 4 (four) times daily.     Analgesics:  Topicals Passed - 10/05/2020 10:44 AM      Passed - Valid encounter within last 12 months    Recent Outpatient Visits          1 month ago DCM (dilated cardiomyopathy) (HCC)   [facility] [place], [place], MD   2 months ago Chronic right-sided low back pain with bilateral sciatica   [facility] [place], [place], MD   3 months ago No-show for appointment   [facility] [place], [place], NP   5 months ago Muscle cramps   [facility] Rema Fendt, NP   7 months ago Hospital discharge follow-up   [facility] [place]onia Kief, Salomon Fick, NP      Future Appointments            In 2 weeks Hoy Register, MD [facility]           . metoprolol succinate (TOPROL-XL) 25 MG 24 hr tablet 90 tablet 0    Sig: Take 1 tablet (25 mg total) by mouth daily.     Cardiovascular:  Beta Blockers Passed - 10/05/2020 10:44 AM      Passed - Last BP in normal range    BP Readings from Last 1 Encounters:  09/23/20 132/71         Passed - Last Heart Rate in normal range    Pulse Readings from Last 1 Encounters:  09/23/20 75         Passed - Valid encounter within last 6 months    Recent Outpatient Visits          1 month ago DCM (dilated cardiomyopathy) (HCC)   [facility] [place], [place], MD   2 months ago Chronic right-sided low back pain with bilateral sciatica   [facility] [place], [place], MD   3 months ago No-show for appointment   [facility]  Rema Fendt, NP   5 months ago Muscle cramps   [facility] [place], [place], NP   7 months ago Hospital discharge follow-up   [facility] [place]onia Kief, [place], NP      Future Appointments            In 2 weeks Hoy Register, MD [facility]           . gabapentin (NEURONTIN) 300 MG capsule 120 capsule 0    Sig: Take 1 capsule (300 mg total) by mouth 2 (two) times daily.     Neurology: Anticonvulsants - gabapentin Passed - 10/05/2020 10:44 AM      Passed - Valid encounter within last 12 months    Recent Outpatient Visits          1 month ago DCM (dilated cardiomyopathy) (HCC)   [facility] [place], [place], MD   2 months ago Chronic right-sided low back pain with bilateral sciatica   [facility] [place], [place], MD   3 months ago No-show for appointment   [facility] [place], [place], NP   5 months ago Muscle cramps   [facility] Rema Fendt, NP   7 months ago Hospital discharge follow-up   [facility] Zonia Kief, Salomon Fick, NP      Future Appointments            In 2 weeks Hoy Register, MD [facility]

## 2020-10-05 NOTE — Telephone Encounter (Signed)
Medication: metoprolol succinate (TOPROL-XL) 25 MG 24 hr tablet [[id number]] , pantoprazole (PROTONIX) 40 MG tablet [[id number]] , diclofenac Sodium (VOLTAREN) 1 % GEL [[id number]] , gabapentin (NEURONTIN) 300 MG capsule [[id number]]   Has the patient contacted their pharmacy? YES  (Agent: If no, request that the patient contact the pharmacy for the refill.) (Agent: If yes, when and what did the pharmacy advise?)  Preferred Pharmacy (with phone number or street name):  [facility] [place], [place] - [place] [address]  [address], [place] [address]  Phone:  [phone] Fax:  [phone]  Agent: Please be advised that RX refills may take up to 3 business days. We ask that you follow-up with your pharmacy.

## 2020-10-06 MED FILL — LACTULOSE 10 GM/15 ML SOLN: 10 | 23 days supply | Qty: 1419 | Fill #2

## 2020-10-19 ENCOUNTER — Other Ambulatory Visit: Payer: Self-pay

## 2020-10-19 ENCOUNTER — Ambulatory Visit: Payer: Self-pay | Admitting: Family Medicine

## 2020-10-20 ENCOUNTER — Telehealth: Payer: Self-pay | Admitting: Family Medicine

## 2020-10-20 DIAGNOSIS — M19041 Primary osteoarthritis, right hand: Secondary | ICD-10-CM

## 2020-10-20 DIAGNOSIS — M25562 Pain in left knee: Secondary | ICD-10-CM

## 2020-10-20 DIAGNOSIS — G8929 Other chronic pain: Secondary | ICD-10-CM

## 2020-10-20 DIAGNOSIS — M19042 Primary osteoarthritis, left hand: Secondary | ICD-10-CM

## 2020-10-20 NOTE — Telephone Encounter (Signed)
Patient would like a referral to a bone specialist asap.  Please call patient to schedule at [phone]

## 2020-10-22 NOTE — Telephone Encounter (Signed)
Pt was called to get more information on the need for the referral. There was no answer.

## 2020-10-28 ENCOUNTER — Other Ambulatory Visit: Payer: Self-pay | Admitting: Family Medicine

## 2020-10-28 DIAGNOSIS — I5023 Acute on chronic systolic (congestive) heart failure: Secondary | ICD-10-CM

## 2020-10-28 DIAGNOSIS — I426 Alcoholic cardiomyopathy: Secondary | ICD-10-CM

## 2020-10-28 DIAGNOSIS — K7011 Alcoholic hepatitis with ascites: Secondary | ICD-10-CM

## 2020-10-28 MED FILL — FUROSEMIDE 40 MG TAB: 40 | 30 days supply | Qty: 30 | Fill #3

## 2020-10-28 MED FILL — SPIRONOLACTONE 100 MG TAB: 100 | 30 days supply | Qty: 30 | Fill #6

## 2020-10-28 MED FILL — MAGNESIUM OXIDE 400 MG TAB: 400 | 100 days supply | Qty: 100 | Fill #1

## 2020-10-28 MED FILL — DICLOFENAC SODIUM 1% GEL: 1 | 12 days supply | Qty: 100 | Fill #1

## 2020-10-28 NOTE — Telephone Encounter (Signed)
Medication Refill - Medication: Aspirin, Diclofenac sodium, furosemide, magnesium oxide, metoprolol, pantoprazole, spironolactone  Has the patient contacted their pharmacy? Yes.  Pt states that he is completely out of these medications. Please advise.  (Agent: If no, request that the patient contact the pharmacy for the refill.) (Agent: If yes, when and what did the pharmacy advise?)  Preferred Pharmacy (with phone number or street name):  [place], [address]  [place] [address]  Phone: [phone] Fax: [phone]  Hours: Not open 24 hours     Agent: Please be advised that RX refills may take up to 3 business days. We ask that you follow-up with your pharmacy.

## 2020-10-28 NOTE — Telephone Encounter (Signed)
Pt states that he is having pain in his foot,knees and hand and is requesting specialist referral.

## 2020-10-28 NOTE — Telephone Encounter (Signed)
[facility] called and spoke to Camanche [place], [employer] officer about the refills the patient has requested. She says there are refills available and some are too early to refill. She says she will let the patient know and will refill what she can.

## 2020-10-28 NOTE — Telephone Encounter (Signed)
Pt called stating that his right foot, knees, and right hand. He states that he is having pain. Please advise.

## 2020-10-29 ENCOUNTER — Telehealth: Payer: Self-pay | Admitting: Family Medicine

## 2020-10-29 NOTE — Telephone Encounter (Signed)
Called pt. And LVM to return call. I do not see and recent X-ray results.   Copied from [place] [phone]. Topic: General - Other >> Oct 28, 2020 11:57 AM Celene Kras wrote: Reason for CRM: Pt calling and is requesting to have a nurse give him a call back regarding his x ray results. He also states that he has a medicaid card and that he is not sure what all that is supposed to cover. Please advise.

## 2020-10-30 ENCOUNTER — Emergency Department (HOSPITAL_COMMUNITY)
Admission: EM | Admit: 2020-10-30 | Discharge: 2020-10-31 | Disposition: A | Discharge status: Home / Self Care | Payer: Medicaid Other | Attending: Emergency Medicine | Admitting: Emergency Medicine

## 2020-10-30 DIAGNOSIS — I110 Hypertensive heart disease with heart failure: Secondary | ICD-10-CM | POA: Insufficient documentation

## 2020-10-30 DIAGNOSIS — Z85038 Personal history of other malignant neoplasm of large intestine: Secondary | ICD-10-CM | POA: Insufficient documentation

## 2020-10-30 DIAGNOSIS — Z20822 Contact with and (suspected) exposure to covid-19: Secondary | ICD-10-CM | POA: Diagnosis not present

## 2020-10-30 DIAGNOSIS — I5022 Chronic systolic (congestive) heart failure: Secondary | ICD-10-CM | POA: Insufficient documentation

## 2020-10-30 DIAGNOSIS — R456 Violent behavior: Secondary | ICD-10-CM | POA: Insufficient documentation

## 2020-10-30 DIAGNOSIS — R1084 Generalized abdominal pain: Secondary | ICD-10-CM | POA: Insufficient documentation

## 2020-10-30 DIAGNOSIS — K7031 Alcoholic cirrhosis of liver with ascites: Secondary | ICD-10-CM

## 2020-10-30 DIAGNOSIS — M79604 Pain in right leg: Secondary | ICD-10-CM | POA: Insufficient documentation

## 2020-10-30 DIAGNOSIS — G629 Polyneuropathy, unspecified: Secondary | ICD-10-CM

## 2020-10-30 DIAGNOSIS — F17200 Nicotine dependence, unspecified, uncomplicated: Secondary | ICD-10-CM | POA: Diagnosis not present

## 2020-10-30 DIAGNOSIS — Z7982 Long term (current) use of aspirin: Secondary | ICD-10-CM | POA: Insufficient documentation

## 2020-10-30 DIAGNOSIS — Z79899 Other long term (current) drug therapy: Secondary | ICD-10-CM | POA: Diagnosis not present

## 2020-10-30 DIAGNOSIS — M79605 Pain in left leg: Secondary | ICD-10-CM | POA: Diagnosis not present

## 2020-10-30 NOTE — Telephone Encounter (Signed)
Done

## 2020-10-31 ENCOUNTER — Emergency Department (HOSPITAL_COMMUNITY): Payer: Medicaid Other

## 2020-10-31 ENCOUNTER — Other Ambulatory Visit (HOSPITAL_COMMUNITY): Payer: Self-pay | Admitting: Emergency Medicine

## 2020-10-31 ENCOUNTER — Encounter (HOSPITAL_COMMUNITY): Payer: Self-pay | Admitting: Emergency Medicine

## 2020-10-31 ENCOUNTER — Other Ambulatory Visit: Payer: Self-pay

## 2020-10-31 LAB — CBC
HCT: 27.9 % — ABNORMAL LOW (ref 39.0–52.0)
Hemoglobin: 9 g/dL — ABNORMAL LOW (ref 13.0–17.0)
MCH: 25.9 pg — ABNORMAL LOW (ref 26.0–34.0)
MCHC: 32.3 g/dL (ref 30.0–36.0)
MCV: 80.4 fL (ref 80.0–100.0)
Platelets: 129 10*3/uL — ABNORMAL LOW (ref 150–400)
RBC: 3.47 MIL/uL — ABNORMAL LOW (ref 4.22–5.81)
RDW: 21.8 % — ABNORMAL HIGH (ref 11.5–15.5)
WBC: 4.7 10*3/uL (ref 4.0–10.5)
nRBC: 0 % (ref 0.0–0.2)

## 2020-10-31 LAB — COMPREHENSIVE METABOLIC PANEL
ALT: 33 U/L (ref 0–44)
AST: 73 U/L — ABNORMAL HIGH (ref 15–41)
Albumin: 3.6 g/dL (ref 3.5–5.0)
Alkaline Phosphatase: 96 U/L (ref 38–126)
Anion gap: 18 — ABNORMAL HIGH (ref 5–15)
BUN: 7 mg/dL (ref 6–20)
CO2: 16 mmol/L — ABNORMAL LOW (ref 22–32)
Calcium: 9 mg/dL (ref 8.9–10.3)
Chloride: 102 mmol/L (ref 98–111)
Creatinine, Ser: 0.85 mg/dL (ref 0.61–1.24)
GFR, Estimated: >60 mL/min (ref >60)
Glucose, Bld: 57 mg/dL — ABNORMAL LOW (ref 70–99)
Potassium: 3.4 mmol/L — ABNORMAL LOW (ref 3.5–5.1)
Sodium: 136 mmol/L (ref 135–145)
Total Bilirubin: 2 mg/dL — ABNORMAL HIGH (ref 0.3–1.2)
Total Protein: 7.3 g/dL (ref 6.5–8.1)

## 2020-10-31 LAB — SARS CORONAVIRUS 2 (TAT 6-24 HRS): SARS Coronavirus 2: NEGATIVE

## 2020-10-31 LAB — LIPASE, BLOOD: Lipase: 26 U/L (ref 11–51)

## 2020-10-31 MED ORDER — KETOROLAC TROMETHAMINE 30 MG/ML IJ SOLN
15.0000 mg | Freq: Once | INTRAMUSCULAR | Status: AC
  Administered 2020-10-31: 15 mg via INTRAVENOUS
  Filled 2020-10-31: qty 1

## 2020-10-31 MED ORDER — IOHEXOL 300 MG/ML  SOLN
80.0000 mL | Freq: Once | INTRAMUSCULAR | Status: AC | PRN
  Administered 2020-10-31: 80 mL via INTRAVENOUS

## 2020-10-31 MED ORDER — LIDOCAINE 5 % EX PTCH: 1.0000 | MEDICATED_PATCH | CUTANEOUS | 0 refills | Status: DC

## 2020-10-31 NOTE — Discharge Instructions (Signed)
Person Under Monitoring Name: Richard [name]  Location: [address] [place] [address]   Infection Prevention Recommendations for Individuals Confirmed to have, or Being Evaluated for, 2019 Novel Coronavirus (COVID-19) Infection Who Receive Care at Home  Individuals who are confirmed to have, or are being evaluated for, COVID-19 should follow the prevention steps below until a healthcare provider or local or state health department says they can return to normal activities.  Stay home except to get medical care You should restrict activities outside your home, except for getting medical care. Do not go to work, school, or public areas, and do not use public transportation or taxis.  Call ahead before visiting your doctor Before your medical appointment, call the healthcare provider and tell them that you have, or are being evaluated for, COVID-19 infection. This will help the healthcare providers office take steps to keep other people from getting infected. Ask your healthcare provider to call the local or state health department.  Monitor your symptoms Seek prompt medical attention if your illness is worsening (e.g., difficulty breathing). Before going to your medical appointment, call the healthcare provider and tell them that you have, or are being evaluated for, COVID-19 infection. Ask your healthcare provider to call the local or state health department.  Wear a facemask You should wear a facemask that covers your nose and mouth when you are in the same room with other people and when you visit a healthcare provider. People who live with or visit you should also wear a facemask while they are in the same room with you.  Separate yourself from other people in your home As much as possible, you should stay in a different room from other people in your home. Also, you should use a separate bathroom, if available.  Avoid sharing household items You  should not share dishes, drinking glasses, cups, eating utensils, towels, bedding, or other items with other people in your home. After using these items, you should wash them thoroughly with soap and water.  Cover your coughs and sneezes Cover your mouth and nose with a tissue when you cough or sneeze, or you can cough or sneeze into your sleeve. Throw used tissues in a lined trash can, and immediately wash your hands with soap and water for at least 20 seconds or use an alcohol-based hand rub.  Wash your [facility] your hands often and thoroughly with soap and water for at least 20 seconds. You can use an alcohol-based hand sanitizer if soap and water are not available and if your hands are not visibly dirty. Avoid touching your eyes, nose, and mouth with unwashed hands.   Prevention Steps for Caregivers and Household Members of Individuals Confirmed to have, or Being Evaluated for, COVID-19 Infection Being Cared for in the Home  If you live with, or provide care at home for, a person confirmed to have, or being evaluated for, COVID-19 infection please follow these guidelines to prevent infection:  Follow healthcare providers instructions Make sure that you understand and can help the patient follow any healthcare provider instructions for all care.  Provide for the patients basic needs You should help the patient with basic needs in the home and provide support for getting groceries, prescriptions, and other personal needs.  Monitor the patients symptoms If they are getting sicker, call his or her medical provider and tell them that the patient has, or is being evaluated for, COVID-19 infection. This will help the healthcare  providers office take steps to keep other people from getting infected. Ask the healthcare provider to call the local or state health department.  Limit the number of people who have contact with the patient If possible, have only one caregiver for the  patient. Other household members should stay in another home or place of residence. If this is not possible, they should stay in another room, or be separated from the patient as much as possible. Use a separate bathroom, if available. Restrict visitors who do not have an essential need to be in the home.  Keep older adults, very young children, and other sick people away from the patient Keep older adults, very young children, and those who have compromised immune systems or chronic health conditions away from the patient. This includes people with chronic heart, lung, or kidney conditions, diabetes, and cancer.  Ensure good ventilation Make sure that shared spaces in the home have good air flow, such as from an air conditioner or an opened window, weather permitting.  Wash your hands often Wash your hands often and thoroughly with soap and water for at least 20 seconds. You can use an alcohol based hand sanitizer if soap and water are not available and if your hands are not visibly dirty. Avoid touching your eyes, nose, and mouth with unwashed hands. Use disposable paper towels to dry your hands. If not available, use dedicated cloth towels and replace them when they become wet.  Wear a facemask and gloves Wear a disposable facemask at all times in the room and gloves when you touch or have contact with the patients blood, body fluids, and/or secretions or excretions, such as sweat, saliva, sputum, nasal mucus, vomit, urine, or feces.  Ensure the mask fits over your nose and mouth tightly, and do not touch it during use. Throw out disposable facemasks and gloves after using them. Do not reuse. Wash your hands immediately after removing your facemask and gloves. If your personal clothing becomes contaminated, carefully remove clothing and launder. Wash your hands after handling contaminated clothing. Place all used disposable facemasks, gloves, and other waste in a lined container before  disposing them with other household waste. Remove gloves and wash your hands immediately after handling these items.  Do not share dishes, glasses, or other household items with the patient Avoid sharing household items. You should not share dishes, drinking glasses, cups, eating utensils, towels, bedding, or other items with a patient who is confirmed to have, or being evaluated for, COVID-19 infection. After the person uses these items, you should wash them thoroughly with soap and water.  Wash laundry thoroughly Immediately remove and wash clothes or bedding that have blood, body fluids, and/or secretions or excretions, such as sweat, saliva, sputum, nasal mucus, vomit, urine, or feces, on them. Wear gloves when handling laundry from the patient. Read and follow directions on labels of laundry or clothing items and detergent. In general, wash and dry with the warmest temperatures recommended on the label.  Clean all areas the individual has used often Clean all touchable surfaces, such as counters, tabletops, doorknobs, bathroom fixtures, toilets, phones, keyboards, tablets, and bedside tables, every day. Also, clean any surfaces that may have blood, body fluids, and/or secretions or excretions on them. Wear gloves when cleaning surfaces the patient has come in contact with. Use a diluted bleach solution (e.g., dilute bleach with 1 part bleach and 10 parts water) or a household disinfectant with a label that says EPA-registered for coronaviruses. To make  a bleach solution at home, add 1 tablespoon of bleach to 1 quart (4 cups) of water. For a larger supply, add  cup of bleach to 1 gallon (16 cups) of water. Read labels of cleaning products and follow recommendations provided on product labels. Labels contain instructions for safe and effective use of the cleaning product including precautions you should take when applying the product, such as wearing gloves or eye protection and making sure you  have good ventilation during use of the product. Remove gloves and wash hands immediately after cleaning.  Monitor yourself for signs and symptoms of illness Caregivers and household members are considered close contacts, should monitor their health, and will be asked to limit movement outside of the home to the extent possible. Follow the monitoring steps for close contacts listed on the symptom monitoring form.   ? If you have additional questions, contact your local health department or call the epidemiologist on call at [phone] (available 24/7). ? This guidance is subject to change. For the most up-to-date guidance from [facility], please refer to their website: [email or web address]

## 2020-10-31 NOTE — ED Provider Notes (Addendum)
[facility] EMERGENCY DEPARTMENT Provider Note   CSN: [id number] Arrival date & time: 10/30/20  2354     History Chief Complaint  Patient presents with  . Abdominal Pain    Richard [name] is a 55 y.o. male.  The history is provided by the patient.  Abdominal Pain Pain location:  Generalized Pain quality: aching   Pain radiates to:  Does not radiate Pain severity:  Moderate Onset quality:  Gradual Timing:  Constant Progression:  Unchanged Chronicity:  Recurrent Context: not recent travel   Relieved by:  Nothing Ineffective treatments:  None tried Associated symptoms: no anorexia, no belching, no chest pain, no chills, no constipation, no cough, no diarrhea, no dysuria, no fatigue, no fever, no flatus, no hematemesis, no hematochezia, no hematuria, no melena, no nausea, no sore throat, no vaginal bleeding, no vaginal discharge and no vomiting   Risk factors: alcohol abuse   Leg Pain Location:  Leg Injury: no   Leg location:  L leg and R leg Pain details:    Quality:  Burning   Radiates to:  Does not radiate   Severity:  Severe   Onset quality:  Gradual   Timing:  Constant   Progression:  Unchanged Chronicity:  Chronic Dislocation: no   Foreign body present:  No foreign bodies Prior injury to area:  No Relieved by:  Nothing Worsened by:  Nothing Ineffective treatments:  None tried Associated symptoms: no back pain, no decreased ROM, no fatigue, no fever, no itching, no muscle weakness, no neck pain, no numbness, no stiffness and no swelling   Risk factors: no concern for non-accidental trauma and no obesity   Patient alcohol abuse presents with neuropathic pain of BLE and diffuse abdominal pain.  No f/c/r. No n/v/d.  No swelling of the lower extremities.  No DOE.  No CP.       Past Medical History:  Diagnosis Date  . Abnormal nuclear cardiac imaging test   . Acid reflux   . Acute pancreatitis 08/14/2018  . Alcohol withdrawal delirium  ([place]) 08/20/2016  . 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 Alcoholic hepatitis   . Alcoholic ketoacidosis [id number]  . Ascites 12/13/2019  . Aspiration pneumonia ([place]) 08/20/2016  . Chest pain of uncertain etiology   . Chronic systolic CHF (congestive heart failure) ([place])   . Cirrhosis ([place])   . Colon cancer ([place])   . DCM (dilated cardiomyopathy) ([place])   . Drug abuse ([place]) 01/07/2020  . Elevated troponin 06/27/2018  . ETOH abuse   . 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 Heme positive stool 11/13/2017  . Hepatic steatosis 08/21/2016  . High anion gap metabolic acidosis [id number]  . History of colon cancer   . HTN (hypertension)   . Hypertensive urgency 06/27/2018  . Hypoglycemia 06/27/2018  . Hypokalemia 01/07/2020  . Hypomagnesemia   . Hypophosphatemia   . Lactic acidosis 08/20/2016  . Leukocytosis 01/07/2020  . Pancreatitis 08/2018  . Polyp of ascending colon   . Prolonged Q-T interval on ECG   . Prolonged QT interval   . Protein-calorie malnutrition, severe 05/02/2018  . PUD (peptic ulcer disease)   . SBP (spontaneous bacterial peritonitis) ([place]) 01/07/2020  . Sepsis ([place]) 08/20/2016  . 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 SVT (supraventricular tachycardia) ([place])   . Thrombocytopenia ([place]) 08/21/2016    Patient Active Problem List   Diagnosis Date Noted  . H/O ETOH abuse 08/25/2020  . Hepatic cirrhosis ([place])   . Abnormal nuclear cardiac imaging test   .  Chest pain of uncertain etiology   . Alcoholic hepatitis 78/29/5621  . Hypokalemia 01/07/2020  . Leukocytosis 01/07/2020  . ETOH abuse 01/07/2020  . 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 Drug abuse ([place]) 01/07/2020  . SBP (spontaneous bacterial peritonitis) ([place]) 01/07/2020  . Tobacco abuse 01/07/2020  . 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 Prolonged QT interval   . Prolonged Q-T interval on ECG   . DCM (dilated cardiomyopathy) ([place])   . Chronic systolic CHF (congestive heart failure) ([place])   . Polyp of ascending colon    . History of colon cancer   . Ascites 12/13/2019  . SVT (supraventricular tachycardia) ([place])   . Hypomagnesemia   . Hypophosphatemia   . High anion gap metabolic acidosis 30/86/5784  . Acute pancreatitis 08/14/2018  . Smoker 08/14/2018  . 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 HTN (hypertension) 08/14/2018  . Abdominal pain 06/27/2018  . Hypertensive urgency 06/27/2018  . Elevated troponin 06/27/2018  . Hypoglycemia 06/27/2018  . Protein-calorie malnutrition, severe 05/02/2018  . Pancreatitis 05/01/2018  . Heme positive stool 11/13/2017  . Alcoholic ketoacidosis 69/62/9528  . Hepatic steatosis 08/21/2016  . Thrombocytopenia ([place]) 08/21/2016  . Alcohol abuse 08/21/2016  . Alcohol withdrawal delirium ([place]) 08/20/2016  . Dehydration 08/20/2016  . Intractable nausea and vomiting 08/20/2016  . Lactic acidosis 08/20/2016  . Aspiration pneumonia ([place]) 08/20/2016  . Sepsis ([place]) 08/20/2016    Past Surgical History:  Procedure Laterality Date  . BIOPSY  12/14/2019   Procedure: BIOPSY;  Surgeon: Mauri Pole, MD;  Location: [facility];  Service: Endoscopy;;  . COLONOSCOPY WITH PROPOFOL N/A 12/14/2019   Procedure: COLONOSCOPY WITH PROPOFOL;  Surgeon: Mauri Pole, MD;  Location: [facility];  Service: Endoscopy;  Laterality: N/A;  . ESOPHAGOGASTRODUODENOSCOPY (EGD) WITH PROPOFOL N/A 12/14/2019   Procedure: ESOPHAGOGASTRODUODENOSCOPY (EGD) WITH PROPOFOL;  Surgeon: Mauri Pole, MD;  Location: [facility];  Service: Endoscopy;  Laterality: N/A;  . LAPAROSCOPIC SIGMOID COLECTOMY  2007  . POLYPECTOMY  12/14/2019   Procedure: POLYPECTOMY;  Surgeon: Mauri Pole, MD;  Location: [facility];  Service: Endoscopy;;       Family History  Problem Relation Age of Onset  . Colon cancer Father   . Cancer Sister        type unknown  . Kidney disease Sister        dialysis  . Colon cancer Cousin        x 2  . CAD Neg Hx   . Stroke Neg Hx   . Diabetes Neg Hx     Social  History   Tobacco Use  . Smoking status: Current Every Day Smoker    Packs/day: 1.00  . Smokeless tobacco: Never Used  Vaping Use  . Vaping Use: Never used  Substance Use Topics  . Alcohol use: Not Currently    Alcohol/week: 2.0 standard drinks    Types: 1 Cans of beer, 1 Shots of liquor per week    Comment: last drink in February  . Drug use: Yes    Types: Marijuana    Home Medications Prior to Admission medications   Medication Sig Start Date End Date Taking? Authorizing Provider  aspirin 81 MG EC tablet Take 1 tablet (81 mg total) by mouth daily. 06/16/20   Fulp, Cammie, MD  diclofenac Sodium (VOLTAREN) 1 % GEL Apply 2 g topically 4 (four) times daily. 10/05/20   [place] Rakes, MD  furosemide (LASIX) 40 MG tablet Take 1 tablet (40 mg total) by mouth daily. 06/16/20  Fulp, Cammie, MD  gabapentin (NEURONTIN) 300 MG capsule Take 1 capsule (300 mg total) by mouth 2 (two) times daily. 10/05/20   [place] Rakes, MD  hyoscyamine (LEVSIN SL) 0.125 MG SL tablet Place 1 tablet (0.125 mg total) under the tongue every 8 (eight) hours as needed. 08/18/20   Zehr, Janett Billow D, PA-C  magnesium oxide (MAG-OX) 400 (241.3 Mg) MG tablet Take 1 tablet (400 mg total) by mouth daily. 06/16/20   Fulp, Cammie, MD  metoprolol succinate (TOPROL-XL) 25 MG 24 hr tablet Take 1 tablet (25 mg total) by mouth daily. 10/05/20   [place] Rakes, MD  pantoprazole (PROTONIX) 40 MG tablet Take 1 tablet (40 mg total) by mouth in the morning. 10/05/20   [place] Rakes, MD  spironolactone (ALDACTONE) 100 MG tablet Take 1 tablet (100 mg total) by mouth daily. 06/16/20   Fulp, Cammie, MD    Allergies    Aspirin and Penicillins  Review of Systems   Review of Systems  Constitutional: Negative for chills, fatigue and fever.  HENT: Negative for sore throat.   Respiratory: Negative for cough.   Cardiovascular: Negative for chest pain.  Gastrointestinal: Positive for abdominal pain. Negative for anorexia, constipation, diarrhea,  flatus, hematemesis, hematochezia, melena, nausea and vomiting.  Genitourinary: Negative for dysuria, hematuria, vaginal bleeding and vaginal discharge.  Musculoskeletal: Positive for arthralgias. Negative for back pain, neck pain and stiffness.  Skin: Negative for itching and rash.  Neurological: Negative for dizziness.  Psychiatric/Behavioral: Negative for agitation.  All other systems reviewed and are negative.   Physical Exam Updated Vital Signs BP 113/70   Pulse 80   Temp 98.5 F (36.9 C) (Oral)   Resp (!) 21   SpO2 99%   Physical Exam Vitals and nursing note reviewed.  Constitutional:      General: He is not in acute distress.    Appearance: Normal appearance.  HENT:     Head: Normocephalic and atraumatic.     Nose: Nose normal.  Eyes:     Conjunctiva/sclera: Conjunctivae normal.     Pupils: Pupils are equal, round, and reactive to light.  Cardiovascular:     Rate and Rhythm: Normal rate and regular rhythm.     Pulses: Normal pulses.     Heart sounds: Normal heart sounds.  Pulmonary:     Effort: Pulmonary effort is normal.     Breath sounds: Normal breath sounds.  Abdominal:     General: Abdomen is flat. Bowel sounds are normal.     Palpations: Abdomen is soft. There is shifting dullness.     Tenderness: There is no abdominal tenderness. There is no guarding or rebound. Negative signs include Murphy's sign, Rovsing's sign and McBurney's sign.  Musculoskeletal:        General: Normal range of motion.     Cervical back: Normal range of motion and neck supple.  Skin:    General: Skin is warm and dry.     Capillary Refill: Capillary refill takes less than 2 seconds.  Neurological:     General: No focal deficit present.     Mental Status: He is alert.     Deep Tendon Reflexes: Reflexes normal.  Psychiatric:        Mood and Affect: Affect is angry.        Behavior: Behavior is aggressive.     ED Results / Procedures / Treatments   Labs (all labs ordered are  listed, but only abnormal results are displayed) Results for orders placed or performed  during the hospital encounter of 10/30/20  Lipase, blood  Result Value Ref Range   Lipase 26 11 - 51 U/L  Comprehensive metabolic panel  Result Value Ref Range   Sodium 136 135 - 145 mmol/L   Potassium 3.4 (L) 3.5 - 5.1 mmol/L   Chloride 102 98 - 111 mmol/L   CO2 16 (L) 22 - 32 mmol/L   Glucose, Bld 57 (L) 70 - 99 mg/dL   BUN 7 6 - 20 mg/dL   Creatinine, Ser 0.85 0.61 - 1.24 mg/dL   Calcium 9.0 8.9 - 10.3 mg/dL   Total Protein 7.3 6.5 - 8.1 g/dL   Albumin 3.6 3.5 - 5.0 g/dL   AST 73 (H) 15 - 41 U/L   ALT 33 0 - 44 U/L   Alkaline Phosphatase 96 38 - 126 U/L   Total Bilirubin 2.0 (H) 0.3 - 1.2 mg/dL   GFR, Estimated >60 >60 mL/min   Anion gap 18 (H) 5 - 15  CBC  Result Value Ref Range   WBC 4.7 4.0 - 10.5 K/uL   RBC 3.47 (L) 4.22 - 5.81 MIL/uL   Hemoglobin 9.0 (L) 13.0 - 17.0 g/dL   HCT 27.9 (L) 39.0 - 52.0 %   MCV 80.4 80.0 - 100.0 fL   MCH 25.9 (L) 26.0 - 34.0 pg   MCHC 32.3 30.0 - 36.0 g/dL   RDW 21.8 (H) 11.5 - 15.5 %   Platelets 129 (L) 150 - 400 K/uL   nRBC 0.0 0.0 - 0.2 %   DG Chest Portable 1 View  Result Date: 10/31/2020 CLINICAL DATA:  Cardiomyopathy EXAM: PORTABLE CHEST 1 VIEW COMPARISON:  Chest x-ray 01/22/2020, CT chest 08/31/2007 FINDINGS: The heart size and mediastinal contours are unchanged. Right lower lobe hazy airspace opacity. Vague hazy airspace opacity within left lower lobe. No pulmonary edema. No pleural effusion. No pneumothorax. No acute osseous abnormality. IMPRESSION: Bilateral lower lobe findings could represent infection/inflammation versus atelectasis. Electronically Signed   By: Iven Finn M.D.   On: 10/31/2020 02:32    EKG None  Radiology No results found.  Procedures Procedures   Medications Ordered in ED Medications  ketorolac (TORADOL) 30 MG/ML injection 15 mg (has no administration in time range)    ED Course  I have reviewed the  triage vital signs and the nursing notes.  Pertinent labs & imaging results that were available during my care of the patient were reviewed by me and considered in my medical decision making (see chart for details).    I do not believe this is bacterial PNA on CXR, lung are clear RR and Oxygen saturation are normal and patient denies cough.  It is likely either atelectasis or COVID.  COVID swab is sent and pending and results will appear in my chart.  Patient states he refuses to leave unless we cure his chronic leg pain.  I have politely told him we will give pain medication hear but he will need to follow up with his PMD to discuss his ongoing leg pain that he curiously did not mention on a call in to the office 2 days ago.  Patient is well appearing.  Exam is benign and reassuring as are vitals.  Patient is sleeping post medication.  Patient is stable for discharge with close follow up.    Richard [name] was evaluated in Emergency Department on 10/31/2020 for the symptoms described in the history of present illness. He was evaluated in the context of the [place] COVID-19  pandemic, which necessitated consideration that the patient might be at risk for infection with the SARS-CoV-2 virus that causes COVID-19. Institutional protocols and algorithms that pertain to the evaluation of patients at risk for COVID-19 are in a state of rapid change based on information released by regulatory bodies including the CDC and federal and state organizations. These policies and algorithms were followed during the patient's care in the ED.  Final Clinical Impression(s) / ED Diagnoses Return for intractable cough, coughing up blood, fevers >100.4 unrelieved by medication, shortness of breath, intractable vomiting, chest pain, shortness of breath, weakness, numbness, changes in speech, facial asymmetry, abdominal pain, passing out, Inability to tolerate liquids or food, cough, altered mental status or any concerns. No  signs of systemic illness or infection. The patient is nontoxic-appearing on exam and vital signs are within normal limits.  I have reviewed the triage vital signs and the nursing notes. Pertinent labs & imaging results that were available during my care of the patient were reviewed by me and considered in my medical decision making (see chart for details). After history, exam, and medical workup I feel the patient has been appropriately medically screened and is safe for discharge home. Pertinent diagnoses were discussed with the patient. Patient was given return precautions.    Samanth Mirkin, MD 10/31/20 [place], Kadesia Robel, MD 10/31/20 [id number]

## 2020-10-31 NOTE — ED Triage Notes (Signed)
Pt presents to ED POV. Pt c/o pain from abd all the way down legs. Pt also reports that when he lies flat he cannot breath. S/s ongoing x4d.

## 2020-11-02 MED FILL — LIDOCAINE PATCH 5%: 5 | 14 days supply | Qty: 14 | Fill #0

## 2020-11-02 NOTE — Telephone Encounter (Signed)
Pt was called and message states that call can not be completed at this time.  No recent X-ray done by any provider at this clinic.

## 2020-11-05 ENCOUNTER — Other Ambulatory Visit: Payer: Self-pay | Admitting: Pharmacist

## 2020-11-05 ENCOUNTER — Telehealth: Payer: Self-pay | Admitting: Family Medicine

## 2020-11-05 DIAGNOSIS — K7682 Hepatic encephalopathy: Secondary | ICD-10-CM

## 2020-11-05 DIAGNOSIS — K7290 Hepatic failure, unspecified without coma: Secondary | ICD-10-CM

## 2020-11-05 MED ORDER — LACTULOSE 10 GM/15ML PO SOLN
20.0000 g | Freq: Two times a day (BID) | ORAL | 0 refills | Status: DC
Start: 2020-11-05 — End: 2020-11-30

## 2020-11-05 MED FILL — PANTOPRAZOLE SOD DR 40 MG T: 40 | 30 days supply | Qty: 30 | Fill #1

## 2020-11-05 MED FILL — SPIRONOLACTONE 100 MG TAB: 100 | 30 days supply | Qty: 30 | Fill #6

## 2020-11-05 MED FILL — FUROSEMIDE 40 MG TAB: 40 | 30 days supply | Qty: 30 | Fill #3

## 2020-11-05 MED FILL — LACTULOSE 10 GM/15 ML SOLN: 10 | 23 days supply | Qty: 1419 | Fill #0

## 2020-11-05 MED FILL — DICLOFENAC SODIUM 1% GEL: 1 | 12 days supply | Qty: 100 | Fill #1

## 2020-11-05 MED FILL — GABAPENTIN 300 MG CAPSULE: 300 | 30 days supply | Qty: 60 | Fill #1

## 2020-11-05 MED FILL — OSCIMIN 0.125 MG SUBL: 0.125 | 10 days supply | Qty: 30 | Fill #1

## 2020-11-05 MED FILL — METOPROLOL SUCCINATE ER 25: 25 | 30 days supply | Qty: 30 | Fill #1

## 2020-11-05 NOTE — Telephone Encounter (Signed)
Coordinated with the pharmacy. Patient picked up his medications today (11/05/2020) at [facility].

## 2020-11-05 NOTE — Telephone Encounter (Signed)
Medication: lactulose (CHRONULAC) 10 GM/15ML solution magnesium oxide (MAG-OX) 400 (241.3 Mg) MG tablet  Has the pt contacted their pharmacy? NO Preferred pharmacy: [facility], [place] [address]  Please be advised refills may take up to 3 business days.  We ask that you follow up with your pharmacy.  HOWEVER Pt states he does not have the money to pick up his other prescriptions that were called in 10/05/20. Also he doesn't have the money to get the other 2.  Pt states he needs help.  He has not meds at his house except gabapentin.  Needs from pharmacy: pantoprazole (PROTONIX) 40 MG tablet furosemide (LASIX) 40 MG tablet metoprolol succinate (TOPROL-XL) 25 MG 24 hr tablet  plese call back to advise

## 2020-11-05 NOTE — Telephone Encounter (Signed)
Please see information below regarding request to get financial help for medications.

## 2020-11-06 ENCOUNTER — Ambulatory Visit (INDEPENDENT_AMBULATORY_CARE_PROVIDER_SITE_OTHER): Payer: Medicaid Other | Admitting: Family Medicine

## 2020-11-06 ENCOUNTER — Other Ambulatory Visit: Payer: Self-pay | Admitting: Family Medicine

## 2020-11-06 ENCOUNTER — Other Ambulatory Visit: Payer: Self-pay

## 2020-11-06 ENCOUNTER — Encounter: Payer: Self-pay | Admitting: Family Medicine

## 2020-11-06 DIAGNOSIS — M79605 Pain in left leg: Secondary | ICD-10-CM

## 2020-11-06 DIAGNOSIS — G629 Polyneuropathy, unspecified: Secondary | ICD-10-CM | POA: Diagnosis not present

## 2020-11-06 DIAGNOSIS — M79604 Pain in right leg: Secondary | ICD-10-CM

## 2020-11-06 DIAGNOSIS — M545 Low back pain, unspecified: Secondary | ICD-10-CM | POA: Diagnosis not present

## 2020-11-06 DIAGNOSIS — G8929 Other chronic pain: Secondary | ICD-10-CM

## 2020-11-06 MED ORDER — GABAPENTIN 300 MG PO CAPS: 600.0000 mg | ORAL_CAPSULE | Freq: Two times a day (BID) | ORAL | 3 refills | Status: DC

## 2020-11-06 NOTE — Progress Notes (Signed)
Office Visit Note   Patient: Richard [name]           Date of Birth: 07-03-1966           MRN: [id number] Visit Date: 11/06/2020 Requested by: Charlott Rakes, MD [place],  [place] [id number] PCP: Charlott Rakes, MD  Subjective: Chief Complaint  Patient presents with  . Other    Ortho eval bil feet, hands and knees pain. H/o right foot fracture. Numbness, tingling and burning in the right foot.     HPI: He is here with bilateral leg pain, right greater than left.  He states that he has had longstanding problems with his legs after "breaking his left knee" many years ago.  Over the years he favored his left leg, but in the past year or so, his right leg has bothered him even more than the left.  He thinks that something happened when he was hospitalized a year ago, and everything started hurting.  He also had colon cancer at age 34 treated with chemotherapy and surgery.  He lost a lot of weight related to that, and has had troubles with chronic constipation since then.  Recently his right leg has been giving him a burning pain into the foot with a tingling and numbness in his toes.  He was placed on gabapentin which has helped a little bit with the tingling but has not eliminated his pain.  He is using lidocaine patch on his back.  He has a history of alcoholism.  He states that he has not been drinking anymore.               ROS:   All other systems were reviewed and are negative.  Objective: Vital Signs: There were no vitals taken for this visit.  Physical Exam:  General:  Alert and oriented, in no acute distress. Pulm:  Breathing unlabored. Psy:  Normal mood, congruent affect. Skin: No visible rash Low back: He complains of tenderness in the midline at the L5-S1 area.  He has pain in both sciatic notch areas.  Straight leg raise negative, lower extremity strength and reflexes are symmetric but his reflexes are hypoactive.  He is diffusely tender to palpation  of his right toes.    Imaging: No results found.  Assessment & Plan: 1.  Chronic right foot and left leg pain, suspect due to polyneuropathy.  Cannot rule out lumbar stenosis. -We will draw some labs today.  Increased gabapentin to 600 mg twice daily.  Referral to physical therapy. -If symptoms persist, then x-rays and MRI scan of the lumbar spine as well as nerve conduction studies.     Procedures: No procedures performed        PMFS History: Patient Active Problem List   Diagnosis Date Noted  . H/O ETOH abuse 08/25/2020  . Hepatic cirrhosis ([place])   . Abnormal nuclear cardiac imaging test   . Chest pain of uncertain etiology   . Alcoholic hepatitis [id number]  . Hypokalemia 01/07/2020  . Leukocytosis 01/07/2020  . ETOH abuse 01/07/2020  . 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 Drug abuse ([place]) 01/07/2020  . SBP (spontaneous bacterial peritonitis) ([place]) 01/07/2020  . Tobacco abuse 01/07/2020  . 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 Prolonged QT interval   . Prolonged Q-T interval on ECG   . DCM (dilated cardiomyopathy) ([place])   . Chronic systolic CHF (congestive heart failure) ([place])   . Polyp of ascending colon   . History of  colon cancer   . Ascites 12/13/2019  . SVT (supraventricular tachycardia) ([place])   . Hypomagnesemia   . Hypophosphatemia   . High anion gap metabolic acidosis 30/86/5784  . Acute pancreatitis 08/14/2018  . Smoker 08/14/2018  . 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 HTN (hypertension) 08/14/2018  . Abdominal pain 06/27/2018  . Hypertensive urgency 06/27/2018  . Elevated troponin 06/27/2018  . Hypoglycemia 06/27/2018  . Protein-calorie malnutrition, severe 05/02/2018  . Pancreatitis 05/01/2018  . Heme positive stool 11/13/2017  . Alcoholic ketoacidosis 69/62/9528  . Hepatic steatosis 08/21/2016  . Thrombocytopenia ([place]) 08/21/2016  . Alcohol abuse 08/21/2016  . Alcohol withdrawal delirium ([place]) 08/20/2016  . Dehydration 08/20/2016  .  Intractable nausea and vomiting 08/20/2016  . Lactic acidosis 08/20/2016  . Aspiration pneumonia ([place]) 08/20/2016  . Sepsis ([place]) 08/20/2016   Past Medical History:  Diagnosis Date  . Abnormal nuclear cardiac imaging test   . Acid reflux   . Acute pancreatitis 08/14/2018  . Alcohol withdrawal delirium ([place]) 08/20/2016  . 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 Alcoholic hepatitis   . Alcoholic ketoacidosis 01/14/2439  . Ascites 12/13/2019  . Aspiration pneumonia ([place]) 08/20/2016  . Chest pain of uncertain etiology   . Chronic systolic CHF (congestive heart failure) ([place])   . Cirrhosis ([place])   . Colon cancer ([place])   . DCM (dilated cardiomyopathy) ([place])   . Drug abuse ([place]) 01/07/2020  . Elevated troponin 06/27/2018  . ETOH abuse   . 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 Heme positive stool 11/13/2017  . Hepatic steatosis 08/21/2016  . High anion gap metabolic acidosis 1/0/2725  . History of colon cancer   . HTN (hypertension)   . Hypertensive urgency 06/27/2018  . Hypoglycemia 06/27/2018  . Hypokalemia 01/07/2020  . Hypomagnesemia   . Hypophosphatemia   . Lactic acidosis 08/20/2016  . Leukocytosis 01/07/2020  . Pancreatitis 08/2018  . Polyp of ascending colon   . Prolonged Q-T interval on ECG   . Prolonged QT interval   . Protein-calorie malnutrition, severe 05/02/2018  . PUD (peptic ulcer disease)   . SBP (spontaneous bacterial peritonitis) ([place]) 01/07/2020  . Sepsis ([place]) 08/20/2016  . 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 SVT (supraventricular tachycardia) ([place])   . Thrombocytopenia ([place]) 08/21/2016    Family History  Problem Relation Age of Onset  . Colon cancer Father   . Cancer Sister        type unknown  . Kidney disease Sister        dialysis  . Colon cancer Cousin        x 2  . CAD Neg Hx   . Stroke Neg Hx   . Diabetes Neg Hx     Past Surgical History:  Procedure Laterality Date  . BIOPSY  12/14/2019   Procedure: BIOPSY;  Surgeon: Mauri Pole, MD;  [facility]: [facility];  Service: Endoscopy;;  . COLONOSCOPY WITH PROPOFOL N/A 12/14/2019   Procedure: COLONOSCOPY WITH PROPOFOL;  Surgeon: Mauri Pole, MD;  [facility];  Service: Endoscopy;  Laterality: N/A;  . ESOPHAGOGASTRODUODENOSCOPY (EGD) WITH PROPOFOL N/A 12/14/2019   Procedure: ESOPHAGOGASTRODUODENOSCOPY (EGD) WITH PROPOFOL;  Surgeon: Mauri Pole, MD;  [facility];  Service: Endoscopy;  Laterality: N/A;  . LAPAROSCOPIC SIGMOID COLECTOMY  2007  . POLYPECTOMY  12/14/2019   Procedure: POLYPECTOMY;  Surgeon: Mauri Pole, MD;  Location: [facility];  Service: Endoscopy;;   Social History   Occupational History  . Not on file  Tobacco Use  . Smoking status:  Current Every Day Smoker    Packs/day: 1.00  . Smokeless tobacco: Never Used  Vaping Use  . Vaping Use: Never used  Substance and Sexual Activity  . Alcohol use: Not Currently    Alcohol/week: 2.0 standard drinks    Types: 1 Cans of beer, 1 Shots of liquor per week    Comment: last drink in February  . Drug use: Yes    Types: Marijuana  . Sexual activity: Not Currently

## 2020-11-09 ENCOUNTER — Telehealth: Payer: Self-pay | Admitting: Family Medicine

## 2020-11-09 LAB — CBC WITH DIFFERENTIAL/PLATELET
Absolute Monocytes: 528 cells/uL (ref 200–950)
Basophils Absolute: 101 cells/uL (ref 0–200)
Basophils Relative: 2.1 %
Eosinophils Absolute: 48 cells/uL (ref 15–500)
Eosinophils Relative: 1 %
HCT: 30.8 % — ABNORMAL LOW (ref 38.5–50.0)
Hemoglobin: 10 g/dL — ABNORMAL LOW (ref 13.2–17.1)
Lymphs Abs: 2078 cells/uL (ref 850–3900)
MCH: 26.7 pg — ABNORMAL LOW (ref 27.0–33.0)
MCHC: 32.5 g/dL (ref 32.0–36.0)
MCV: 82.4 fL (ref 80.0–100.0)
MPV: 11.1 fL (ref 7.5–12.5)
Monocytes Relative: 11 %
Neutro Abs: 2045 cells/uL (ref 1500–7800)
Neutrophils Relative %: 42.6 %
Platelets: 161 10*3/uL (ref 140–400)
RBC: 3.74 10*6/uL — ABNORMAL LOW (ref 4.20–5.80)
RDW: 18.1 % — ABNORMAL HIGH (ref 11.0–15.0)
Total Lymphocyte: 43.3 %
WBC: 4.8 10*3/uL (ref 3.8–10.8)

## 2020-11-09 LAB — COMPREHENSIVE METABOLIC PANEL
AG Ratio: 1.2 (calc) (ref 1.0–2.5)
ALT: 24 U/L (ref 9–46)
AST: 75 U/L — ABNORMAL HIGH (ref 10–35)
Albumin: 4.2 g/dL (ref 3.6–5.1)
Alkaline phosphatase (APISO): 103 U/L (ref 35–144)
BUN/Creatinine Ratio: 8 (calc) (ref 6–22)
BUN: 5 mg/dL — ABNORMAL LOW (ref 7–25)
CO2: 21 mmol/L (ref 20–32)
Calcium: 9.2 mg/dL (ref 8.6–10.3)
Chloride: 101 mmol/L (ref 98–110)
Creat: 0.6 mg/dL — ABNORMAL LOW (ref 0.70–1.33)
Globulin: 3.5 g/dL (calc) (ref 1.9–3.7)
Glucose, Bld: 67 mg/dL (ref 65–99)
Potassium: 3.7 mmol/L (ref 3.5–5.3)
Sodium: 137 mmol/L (ref 135–146)
Total Bilirubin: 1.7 mg/dL — ABNORMAL HIGH (ref 0.2–1.2)
Total Protein: 7.7 g/dL (ref 6.1–8.1)

## 2020-11-09 LAB — VITAMIN B12: Vitamin B-12: 498 pg/mL (ref 200–1100)

## 2020-11-09 LAB — THYROID PANEL WITH TSH
Free Thyroxine Index: 2.4 (ref 1.4–3.8)
T3 Uptake: 35 % (ref 22–35)
T4, Total: 6.9 ug/dL (ref 4.9–10.5)
TSH: 1 mIU/L (ref 0.40–4.50)

## 2020-11-09 LAB — HEMOGLOBIN A1C
Hgb A1c MFr Bld: 4.6 % of total Hgb (ref <5.7)
Mean Plasma Glucose: 85 mg/dL
eAG (mmol/L): 4.7 mmol/L

## 2020-11-09 LAB — URIC ACID: Uric Acid, Serum: 5.6 mg/dL (ref 4.0–8.0)

## 2020-11-09 LAB — SEDIMENTATION RATE: Sed Rate: 28 mm/h — ABNORMAL HIGH (ref 0–20)

## 2020-11-09 LAB — RHEUMATOID FACTOR: Rheumatoid fact SerPl-aCnc: <14 IU/mL (ref <14)

## 2020-11-09 LAB — ANA: Anti Nuclear Antibody (ANA): NEGATIVE

## 2020-11-09 LAB — VITAMIN D 25 HYDROXY (VIT D DEFICIENCY, FRACTURES): Vit D, 25-Hydroxy: 14 ng/mL — ABNORMAL LOW (ref 30–100)

## 2020-11-09 LAB — C-REACTIVE PROTEIN: CRP: 1.6 mg/L (ref <8.0)

## 2020-11-09 NOTE — Telephone Encounter (Signed)
Labs are notable for the following:  Vitamin D is very low at 14.  We want this to be 50-80.  I recommend taking over-the-counter vitamin D3, 5000 IU tablets, 2 of them daily for 3 months and then 1 daily long-term after that.  CBC continues to show signs of anemia, but the numbers look better than they did last month.  It would probably benefit him to take an over-the-counter iron supplement at about 325 mg daily for the next 2 to 3 months and then recheck levels.  One of the liver enzymes, AST, is elevated again at 75.  Blood sugar and hemoglobin A1c look normal.  Thyroid studies look normal.  No sign of autoimmune disease, and the inflammation marker looks good.

## 2020-11-10 NOTE — Telephone Encounter (Signed)
I called - no answer on the patient's mobile. Will try again later.

## 2020-11-11 MED ORDER — VITAMIN D-3 125 MCG (5000 UT) PO TABS: ORAL_TABLET | ORAL | 3 refills | Status: DC

## 2020-11-11 MED ORDER — FERROUS GLUCONATE 324 (38 FE) MG PO TABS: 324.0000 mg | ORAL_TABLET | Freq: Every day | ORAL | 2 refills | Status: DC

## 2020-11-11 NOTE — Telephone Encounter (Signed)
Rxs sent

## 2020-11-11 NOTE — Addendum Note (Signed)
Addended by: Hortencia Pilar on: 11/11/2020 02:10 PM   Modules accepted: Orders

## 2020-11-11 NOTE — Telephone Encounter (Signed)
I called and advised the patient and his mother of the results. They asked if we could send in the vitamin D3 and the iron supplement 325 mg to the [facility]. The patient does not work and this would be cheaper for him.

## 2020-11-18 ENCOUNTER — Telehealth: Payer: Self-pay | Admitting: Family Medicine

## 2020-11-18 NOTE — Telephone Encounter (Signed)
Copied from [place] [id number]. Topic: Appointment Scheduling - Scheduling Inquiry for Clinic >> Nov 18, 2020 12:55 PM Scherrie Gerlach wrote: Reason for CRM: pt needs appt for the [facility]. Please leave message.  Pt needs appt asap, his medicaid ends 12/31/20   Called patient and LVM advising him that since he has active medicaid coverage he would not be eligible for financial assistance. Advised patient to call back the last week of March for an appointment for the first week of April when he has no active insurance coverage, we can then have patient apply. Advised patient to call [phone] with any questions or concerns.

## 2020-11-26 ENCOUNTER — Telehealth: Payer: Self-pay | Admitting: Family Medicine

## 2020-11-26 NOTE — Telephone Encounter (Signed)
Copied from [place] [id number]. Topic: General - Other >> Nov 26, 2020  2:38 PM Leward Quan A wrote: Reason for CRM: Patient called in to inquire of [place] how to apply for his SSI.  Please call Ph# [phone]

## 2020-11-26 NOTE — Telephone Encounter (Signed)
I return Pt call, He was inform to contact the Social security dept directly to inquired what he need to apply

## 2020-11-30 ENCOUNTER — Other Ambulatory Visit: Payer: Self-pay | Admitting: Family Medicine

## 2020-11-30 DIAGNOSIS — K7290 Hepatic failure, unspecified without coma: Secondary | ICD-10-CM

## 2020-11-30 DIAGNOSIS — K7682 Hepatic encephalopathy: Secondary | ICD-10-CM

## 2020-11-30 MED FILL — LACTULOSE 10 GM/15 ML SOLN: 10 | 23 days supply | Qty: 1419 | Fill #0

## 2020-12-07 ENCOUNTER — Ambulatory Visit: Payer: Medicaid Other | Attending: Family Medicine | Admitting: Physical Therapy

## 2020-12-07 ENCOUNTER — Encounter: Payer: Self-pay | Admitting: Physical Therapy

## 2020-12-07 ENCOUNTER — Other Ambulatory Visit: Payer: Self-pay

## 2020-12-07 DIAGNOSIS — R2689 Other abnormalities of gait and mobility: Secondary | ICD-10-CM | POA: Diagnosis present

## 2020-12-07 DIAGNOSIS — R293 Abnormal posture: Secondary | ICD-10-CM | POA: Diagnosis present

## 2020-12-07 DIAGNOSIS — M6281 Muscle weakness (generalized): Secondary | ICD-10-CM | POA: Diagnosis present

## 2020-12-07 NOTE — Therapy (Signed)
[place], [place], [address] Phone: [phone]   Fax:  [phone]  Physical Therapy Evaluation  Patient Details  Name: Richard [name] MRN: [id number] Date of Birth: 1966/05/04 Referring Provider (PT): Eunice Blase, MD   Encounter Date: 12/07/2020   PT End of Session - 12/07/20 1145    Visit Number 1    Number of Visits 13    Date for PT Re-Evaluation 01/18/21    Authorization Type [employer] M[employer]    PT Start Time 1146    PT Stop Time 1230    PT Time Calculation (min) 44 min    Activity Tolerance Patient tolerated treatment well    Behavior During Therapy [facility] for tasks assessed/performed           Past Medical History:  Diagnosis Date  . Abnormal nuclear cardiac imaging test   . Acid reflux   . Acute pancreatitis 08/14/2018  . Alcohol withdrawal delirium ([place]) 08/20/2016  . 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 Alcoholic hepatitis   . Alcoholic ketoacidosis 0/05/6760  . Ascites 12/13/2019  . Aspiration pneumonia ([place]) 08/20/2016  . Chest pain of uncertain etiology   . Chronic systolic CHF (congestive heart failure) ([place])   . Cirrhosis ([place])   . Colon cancer ([place])   . DCM (dilated cardiomyopathy) ([place])   . Drug abuse ([place]) 01/07/2020  . Elevated troponin 06/27/2018  . ETOH abuse   . 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 Heme positive stool 11/13/2017  . Hepatic steatosis 08/21/2016  . High anion gap metabolic acidosis 06/08/931  . History of colon cancer   . HTN (hypertension)   . Hypertensive urgency 06/27/2018  . Hypoglycemia 06/27/2018  . Hypokalemia 01/07/2020  . Hypomagnesemia   . Hypophosphatemia   . Lactic acidosis 08/20/2016  . Leukocytosis 01/07/2020  . Pancreatitis 08/2018  . Polyp of ascending colon   . Prolonged Q-T interval on ECG   . Prolonged QT interval   . Protein-calorie malnutrition, severe 05/02/2018  . PUD (peptic ulcer disease)   . SBP (spontaneous bacterial  peritonitis) ([place]) 01/07/2020  . Sepsis ([place]) 08/20/2016  . 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 SVT (supraventricular tachycardia) ([place])   . Thrombocytopenia ([place]) 08/21/2016    Past Surgical History:  Procedure Laterality Date  . BIOPSY  12/14/2019   Procedure: BIOPSY;  Surgeon: Mauri Pole, MD;  [facility];  Service: Endoscopy;;  . catherization  2007  . COLONOSCOPY WITH PROPOFOL N/A 12/14/2019   Procedure: COLONOSCOPY WITH PROPOFOL;  Surgeon: Mauri Pole, MD;  [facility];  Service: Endoscopy;  Laterality: N/A;  . ESOPHAGOGASTRODUODENOSCOPY (EGD) WITH PROPOFOL N/A 12/14/2019   Procedure: ESOPHAGOGASTRODUODENOSCOPY (EGD) WITH PROPOFOL;  Surgeon: Mauri Pole, MD;  [facility];  Service: Endoscopy;  Laterality: N/A;  . HERNIA REPAIR  1969   1 x at birth and at 55 years old  . LAPAROSCOPIC SIGMOID COLECTOMY  2007  . OPEN REDUCTION INTERNAL FIXATION (ORIF) HAND Right 2012   3rd  digit  . POLYPECTOMY  12/14/2019   Procedure: POLYPECTOMY;  Surgeon: Mauri Pole, MD;  [facility];  Service: Endoscopy;;    There were no vitals filed for this visit.    Subjective Assessment - 12/07/20 1154    Subjective pt is a 55 y.o with CC of chroic low back pain pain that has been going on for over 30 years. 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 he reports he fell in Feburary 2021 and fell backward, when he was trying to sit to down. pain stays mostly in the back and with refer down bil LE. pt reports numbness into the groin tha thas been going on for a while, but denies any incontinence.    How long can you sit comfortably? 10 min    How long can you stand comfortably? 10 min    How long can you walk comfortably? 5-10 min    Diagnostic tests N/A    Patient Stated Goals decrease pain, get back to normal function,    Currently in Pain? Yes    Pain Score 6     at worst 10/10   Pain Location Back    Pain Orientation Right;Left;Lower    Pain Descriptors / Indicators Aching;Sore    Pain Type Chronic pain    Pain Onset More than a month ago    Pain Frequency Intermittent    Aggravating Factors  laying down, and walking/ standing for long periods    Pain Relieving Factors putting foot in hot water, self massage    Effect of Pain on Daily Activities limited standing/ walking endurance and positional tolerance    Multiple Pain Sites Yes    Pain Score 7    Pain Location Leg    Pain Orientation Right;Left    Pain Descriptors / Indicators Aching    Pain Type Chronic pain    Pain Onset More than a month ago    Pain Frequency Intermittent    Aggravating Factors  stnading walking/ standing    Pain Relieving Factors hot water              [facility] PT Assessment - 12/07/20 0001      Assessment   Medical Diagnosis Bilateral leg pain (M79.604, M79.605), Chronic bilateral low back pain, unspecified whether sciatica present (M54.50, G89.29), Neuropathy (G62.9)    Referring Provider (PT) Hilts, Michael, MD    Onset Date/Surgical Date --   over 30 years   Hand Dominance Right    Next MD Visit unsure    Prior Therapy no      Precautions   Precautions None      Restrictions   Weight Bearing Restrictions No      Balance Screen   Has the patient fallen in the past 6 months Yes    How many times? 2    Has the patient had a decrease in activity level because of a fear of falling?  Yes    Is the patient reluctant to leave their home because of a fear of falling?  Yes      [place] residence    Living Arrangements Other relatives    Available Help at Discharge Family    Type of [place] Access Level entry    Home Layout One level    [place] - 2 wheels;Cane - single point   ankle and wrist brace     Prior Function   Level of Independence Independent with basic ADLs    Vocation On  disability      Cognition   Overall Cognitive Status Within Functional Limits for tasks assessed      Sensation   Light Touch Impaired Detail    Additional Comments altered sensation in bil L4-S2 dermatomes L>R      Posture/Postural Control   Posture/Postural  Control Postural limitations    Postural Limitations Rounded Shoulders;Forward head;Decreased lumbar lordosis;Decreased thoracic kyphosis;Weight shift left      ROM / Strength   AROM / PROM / Strength AROM;Strength      AROM   Overall AROM Comments pt resting posiont is 10 degrees of trunk exgension exhibiting sway back position    AROM Assessment Site Lumbar    Lumbar Flexion 60   uses hands on thighs to help lower down   Lumbar Extension 5    Lumbar - Right Side Bend 8    Lumbar - Left Side Bend 8      Strength   Strength Assessment Site Hip;Knee    Right/Left Hip Right;Left    Right Hip Flexion 4-/5    Right Hip Extension 3+/5    Right Hip ABduction 3/5    Right Hip ADduction 3/5    Left Hip Flexion 3+/5    Left Hip Extension 3+/5    Left Hip ABduction 3/5    Left Hip ADduction 3/5    Right/Left Knee Right;Left    Right Knee Flexion 4-/5    Right Knee Extension 5/5    Left Knee Flexion 4-/5    Left Knee Extension 5/5      Palpation   Palpation comment TTP along bil lumbar paraspinals and along T5-T10 Ribs      Ambulation/Gait   Gait Pattern Decreased stride length;Trendelenburg;Antalgic;Decreased trunk rotation;Trunk flexed;Narrow base of support                      Objective measurements completed on examination: See above findings.               PT Education - 12/07/20 1229    Education Details evaluation findings, POC, goals, HEP with proper form. reviewed standing/ seated posture    Person(s) Educated Patient    Methods Explanation;Verbal cues;Handout    Comprehension Verbalized understanding;Verbal cues required            PT Short Term Goals - 12/07/20 1242      PT  SHORT TERM GOAL #1   Title pt to IND with inital HEP    Baseline no previous HEP    Time 3    Period Weeks    Status New    Target Date 12/28/20      PT SHORT TERM GOAL #2   Title pt to verbalize/ demo efficient posture and lifting mechanics to reduce and prevent back    Baseline no knowledge of efficient posture    Time 3    Period Weeks    Status New    Target Date 12/28/20             PT Long Term Goals - 12/07/20 1243      PT LONG TERM GOAL #1   Title pt to increase trunk flexion to >/= 70 degree and extension by >/= 6 degrees with </= 4/10 max pain to promote functional trunk mobility    Baseline see flowsheet    Time 6    Period Weeks    Status New    Target Date 01/18/21      PT LONG TERM GOAL #2   Title increase gross hip strength to >/= 4/5 to promote standing/ walking stability and maximize safety    Baseline see flowsheet    Time 6    Period Weeks    Status New    Target Date 01/18/21  PT LONG TERM GOAL #3   Title pt to be able to sit ,stand and walk for >/= 30 min for in home and short community distances with </= 5/10 max pain    Baseline able to sit 10 min, stand 10 min and walk 5-10 min    Time 6    Period Weeks    Status New    Target Date 01/18/21      PT LONG TERM GOAL #4   Title pt to be able to lay down and report max pain to </= 5/10 to demo improvement in condition and QOL    Baseline max pain 10 at night and with laying down    Time 6    Period Weeks    Status New    Target Date 01/18/21      PT LONG TERM GOAL #5   Title pt to be IND with all HEP given and is able to maintain and progress current LOF IND    Baseline no previous HEP    Time 6    Period Weeks    Status New    Target Date 01/18/21                  Plan - 12/07/20 1230    Clinical Impression Statement pt presents to OPPT with CC of chronic low back and LE pain going on for over 30 years. pt presents with slouched sacral sitting posture throughout  evalautin, and stands with trunk in extended position. He has gross limitation of trunk mobliity in all planes noting concordatn symptoms during assessment. TTP noted alongbil lumbar paraspinals and L T5-T10 ribs. Gross weakness noted in bil LE and altered sensation in bil L4-S2 dermatomes L>R. He would benefit from physical therapy to decrease low back / LE pain, improve core and gross hip strength, maximize postural efficiency and overal function by addressing the deficits listed.    Personal Factors and Comorbidities Comorbidity 3+;Behavior Pattern;Social Background;Time since onset of injury/illness/exacerbation;Past/Current Experience    Comorbidities significant PHMx including  hx of MI,Cx, HTN    Examination-Activity Limitations Locomotion Level;Sleep;Stand;Stairs;Transfers    Stability/Clinical Decision Making Unstable/Unpredictable    Clinical Decision Making High    Rehab Potential Good    PT Frequency 2x / week    PT Duration 6 weeks    PT Treatment/Interventions ADLs/Self Care Home Management;Cryotherapy;Electrical Stimulation;Iontophoresis 4mg /ml Dexamethasone;Ultrasound;Traction;Therapeutic activities;Therapeutic exercise;Neuromuscular re-education;Manual techniques;Patient/family education;Taping;Passive range of motion;Dry needling;Moist Heat;Balance training    PT Next Visit Plan review/ update HEP - posture education, hip and core strenghteing, He doesn't toleate laying supine well may need wedge.    PT Home Exercise Plan [id number] - LTR, seated anterior pelvic tilt, seated march, sidelying hip abduction, seated abdominal set    Consulted and Agree with Plan of Care Patient           Patient will benefit from skilled therapeutic intervention in order to improve the following deficits and impairments:  Improper body mechanics,Increased muscle spasms,Obesity,Pain,Decreased activity tolerance,Decreased endurance,Decreased balance,Decreased range of motion  Visit Diagnosis: Abnormal  posture  Other abnormalities of gait and mobility  Muscle weakness (generalized)     Problem List Patient Active Problem List   Diagnosis Date Noted  . H/O ETOH abuse 08/25/2020  . Hepatic cirrhosis ([place])   . Abnormal nuclear cardiac imaging test   . Chest pain of uncertain etiology   . Alcoholic hepatitis 41/32/4401  . Hypokalemia 01/07/2020  . Leukocytosis 01/07/2020  . ETOH abuse 01/07/2020  . Alcoholic  cardiomyopathy ([place]) 01/07/2020  . Drug abuse ([place]) 01/07/2020  . SBP (spontaneous bacterial peritonitis) ([place]) 01/07/2020  . Tobacco abuse 01/07/2020  . 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 Prolonged QT interval   . Prolonged Q-T interval on ECG   . DCM (dilated cardiomyopathy) ([place])   . Chronic systolic CHF (congestive heart failure) ([place])   . Polyp of ascending colon   . History of colon cancer   . Ascites 12/13/2019  . SVT (supraventricular tachycardia) ([place])   . Hypomagnesemia   . Hypophosphatemia   . High anion gap metabolic acidosis 24/46/2863  . Acute pancreatitis 08/14/2018  . Smoker 08/14/2018  . 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 HTN (hypertension) 08/14/2018  . Abdominal pain 06/27/2018  . Hypertensive urgency 06/27/2018  . Elevated troponin 06/27/2018  . Hypoglycemia 06/27/2018  . Protein-calorie malnutrition, severe 05/02/2018  . Pancreatitis 05/01/2018  . Heme positive stool 11/13/2017  . Alcoholic ketoacidosis 81/77/1165  . Hepatic steatosis 08/21/2016  . Thrombocytopenia ([place]) 08/21/2016  . Alcohol abuse 08/21/2016  . Alcohol withdrawal delirium ([place]) 08/20/2016  . Dehydration 08/20/2016  . Intractable nausea and vomiting 08/20/2016  . Lactic acidosis 08/20/2016  . Aspiration pneumonia ([place]) 08/20/2016  . Sepsis ([place]) 08/20/2016   Starr [place] PT, DPT, LAT, ATC  12/07/20  1:01 PM      [facility] [address] [place], [place], [address] Phone: [phone]   Fax:   [phone]  Name: Richard [name] MRN: [id number] Date of Birth: 1966-05-08         Check all possible CPT codes: 32023- Therapeutic Exercise, [phone]- Neuro Re-education, [phone] - Gait Training, [place], [phone] - Therapeutic Activities, [phone] - Self Care and [phone] - Physical performance training

## 2020-12-10 ENCOUNTER — Telehealth: Payer: Self-pay | Admitting: Family Medicine

## 2020-12-10 DIAGNOSIS — R935 Abnormal findings on diagnostic imaging of other abdominal regions, including retroperitoneum: Secondary | ICD-10-CM

## 2020-12-10 DIAGNOSIS — K7030 Alcoholic cirrhosis of liver without ascites: Secondary | ICD-10-CM

## 2020-12-10 NOTE — Telephone Encounter (Signed)
Pt was called and informed of results and MRI appointment. Pt has appointment with PCP on 12/16/20

## 2020-12-10 NOTE — Telephone Encounter (Signed)
Pt got CT scan done in ED and received a note informing him to follow up with PCP regarding any questions. He is requesting the results of the scan.

## 2020-12-10 NOTE — Telephone Encounter (Signed)
CT scan revealed fatty liver, abnormal liver with recommendation for MRI to exclude presence of cancer.  MRI has been ordered.  Can you please schedule this for him?  He needs to schedule an appointment with his GI doctor and also needs a follow-up appointment with me.

## 2020-12-10 NOTE — Telephone Encounter (Signed)
Copied from [place] [phone]. Topic: General - Other >> Dec 09, 2020  3:14 PM [name] wrote: Reason for CRM: Patient would like the nurse to call him regarding a letter he received for some test results.  He stated he is confused and has some questions about the letter and results.  Please call patient to discuss at [phone]

## 2020-12-11 ENCOUNTER — Other Ambulatory Visit: Payer: Self-pay | Admitting: Gastroenterology

## 2020-12-11 ENCOUNTER — Telehealth: Payer: Self-pay | Admitting: Family Medicine

## 2020-12-11 ENCOUNTER — Other Ambulatory Visit: Payer: Self-pay | Admitting: Family Medicine

## 2020-12-11 ENCOUNTER — Other Ambulatory Visit: Payer: Self-pay | Admitting: Pharmacist

## 2020-12-11 DIAGNOSIS — K7011 Alcoholic hepatitis with ascites: Secondary | ICD-10-CM

## 2020-12-11 MED ORDER — SPIRONOLACTONE 100 MG PO TABS: 100.0000 mg | ORAL_TABLET | Freq: Every day | ORAL | 0 refills | Status: DC

## 2020-12-11 MED FILL — METOPROLOL SUCCINATE ER 25: 25 | 30 days supply | Qty: 30 | Fill #2

## 2020-12-11 MED FILL — PANTOPRAZOLE SOD DR 40 MG T: 40 | 30 days supply | Qty: 30 | Fill #2

## 2020-12-11 MED FILL — SPIRONOLACTONE 100 MG TAB: 100 | 30 days supply | Qty: 30 | Fill #0

## 2020-12-11 MED FILL — DICLOFENAC SODIUM 1% GEL: 1 | 20 days supply | Qty: 100 | Fill #0

## 2020-12-11 MED FILL — FUROSEMIDE 40 MG TAB: 40 | 30 days supply | Qty: 30 | Fill #4

## 2020-12-11 NOTE — Telephone Encounter (Signed)
Copied from [place] [phone]. Topic: General - Other >> Dec 11, 2020  1:09 PM Oneta Rack wrote: Reason for CRM: patient states he has not heard back regarding the status of his orange card. Patient states he has applied several weeks ago

## 2020-12-11 NOTE — Telephone Encounter (Signed)
Patient states he would like a sooner appointment because he is worried about the results [phone],

## 2020-12-14 ENCOUNTER — Telehealth: Payer: Self-pay | Admitting: Family Medicine

## 2020-12-14 ENCOUNTER — Other Ambulatory Visit: Payer: Self-pay | Admitting: Gastroenterology

## 2020-12-14 ENCOUNTER — Ambulatory Visit: Payer: Medicaid Other | Admitting: Physical Therapy

## 2020-12-14 NOTE — Telephone Encounter (Signed)
Copied from [place] [phone]. Topic: General - Other >> Dec 14, 2020 12:04 PM [name] wrote: Reason for CRM: Patient would like the nurse to call him to go over his recent CT test.  He stated he has some questions.  Please call patient to explain at [phone]

## 2020-12-14 NOTE — Telephone Encounter (Signed)
Pt was called and a VM was left informing him f his appointment on 12/16/20

## 2020-12-14 NOTE — Telephone Encounter (Signed)
No sooner appointments available

## 2020-12-14 NOTE — Telephone Encounter (Signed)
I return Pt call, LM to call me back, I check the Pt record he did not apply for the [facility] [program with me so I don't have any information at this time, and here at [facility] we don't take the [facility] program

## 2020-12-16 ENCOUNTER — Other Ambulatory Visit: Payer: Self-pay

## 2020-12-16 ENCOUNTER — Encounter: Payer: Self-pay | Admitting: Family Medicine

## 2020-12-16 ENCOUNTER — Other Ambulatory Visit: Payer: Self-pay | Admitting: Family Medicine

## 2020-12-16 ENCOUNTER — Ambulatory Visit: Payer: Medicaid Other | Attending: Family Medicine | Admitting: Family Medicine

## 2020-12-16 DIAGNOSIS — R935 Abnormal findings on diagnostic imaging of other abdominal regions, including retroperitoneum: Secondary | ICD-10-CM

## 2020-12-16 DIAGNOSIS — K7031 Alcoholic cirrhosis of liver with ascites: Secondary | ICD-10-CM

## 2020-12-16 DIAGNOSIS — G629 Polyneuropathy, unspecified: Secondary | ICD-10-CM

## 2020-12-16 MED ORDER — CYCLOBENZAPRINE HCL 10 MG PO TABS: 10.0000 mg | ORAL_TABLET | Freq: Every day | ORAL | 0 refills | Status: DC

## 2020-12-16 MED ORDER — DULOXETINE HCL 60 MG PO CPEP
60.0000 mg | ORAL_CAPSULE | Freq: Every day | ORAL | 3 refills | Status: DC
Start: 2020-12-16 — End: 2020-12-16

## 2020-12-16 NOTE — Progress Notes (Signed)
Virtual Visit via Telephone Note  I connected with [name], on 12/16/2020 at 3:39 PM by telephone due to the COVID-19 pandemic and verified that I am speaking with the correct person using two identifiers.   Consent: I discussed the limitations, risks, security and privacy concerns of performing an evaluation and management service by telephone and the availability of in person appointments. I also discussed with the patient that there may be a patient responsible charge related to this service. The patient expressed understanding and agreed to proceed.   Location of Patient: Home  Location of Provider: Clinic   Persons participating in Telemedicine visit: Domonic Kimball Farrington-CMA Dr. Margarita Rana     History of Present Illness: Richard [name] is a 55 year old male patient of Dr. Chapman Fitch with a history of peptic ulcer disease, alcoholic liver cirrhosis, GERD, SVT, dilated cardiomyopathy (EF 60-65 %), Depression  seen for acute concerns.  States he has been feeling funny in his stomach and ever since he had a paracentesis he has had pain in both of his legs which feels like tightening and he has to rub his legs for relief and this happens when he goes to sleep.  He is currently on gabapentin prescribed by his cardiologist but states this has been ineffective.  He also has questions about a CT scan performed on an ED visit on 10/31/2020 and had called concerning this hence this appointment was scheduled. IMPRESSION: 1. Hepatic steatosis with associated heterogeneous hepatic parenchyma. Recommend MR liver protocol for further evaluation to exclude underlying malignancy. 2. Nonspecific pericholecystic fluid which can be seen in the setting of chronic liver disease. 3. Trace ascites. 4.  Aortic Atherosclerosis (ICD10-I70.0).  I had ordered an MRI abdomen for him and this comes up in 2 weeks. His gastroenterologist is [facility]. Past Medical History:   Diagnosis Date  . Abnormal nuclear cardiac imaging test   . Acid reflux   . Acute pancreatitis 08/14/2018  . Alcohol withdrawal delirium ([place]) 08/20/2016  . Alcoholic cardiomyopathy ([place]) 01/07/2020  . Alcoholic hepatitis   . Alcoholic ketoacidosis 6/78/9381  . Ascites 12/13/2019  . Aspiration pneumonia ([place]) 08/20/2016  . Chest pain of uncertain etiology   . Chronic systolic CHF (congestive heart failure) ([place])   . Cirrhosis ([place])   . Colon cancer ([place])   . DCM (dilated cardiomyopathy) ([place])   . Drug abuse ([place]) 01/07/2020  . Elevated troponin 06/27/2018  . ETOH abuse   . 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 Heme positive stool 11/13/2017  . Hepatic steatosis 08/21/2016  . High anion gap metabolic acidosis 0/10/7508  . History of colon cancer   . HTN (hypertension)   . Hypertensive urgency 06/27/2018  . Hypoglycemia 06/27/2018  . Hypokalemia 01/07/2020  . Hypomagnesemia   . Hypophosphatemia   . Lactic acidosis 08/20/2016  . Leukocytosis 01/07/2020  . Pancreatitis 08/2018  . Polyp of ascending colon   . Prolonged Q-T interval on ECG   . Prolonged QT interval   . Protein-calorie malnutrition, severe 05/02/2018  . PUD (peptic ulcer disease)   . SBP (spontaneous bacterial peritonitis) ([place]) 01/07/2020  . Sepsis ([place]) 08/20/2016  . 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 SVT (supraventricular tachycardia) ([place])   . Thrombocytopenia ([place]) 08/21/2016   Allergies  Allergen Reactions  . Aspirin Other (See Comments)    Acid reflux   . Penicillins Hives    Has patient had a PCN reaction causing immediate rash, facial/tongue/throat swelling, SOB or lightheadedness with hypotension: yes Has patient  had a PCN reaction causing severe rash involving mucus membranes or skin necrosis: no Has patient had a PCN reaction that required hospitalization: no Has patient had a PCN reaction occurring within the last 10 years: no If all of the above answers are "NO", then may proceed with Cephalosporin use.      Current Outpatient Medications on File Prior to Visit  Medication Sig Dispense Refill  . aspirin 81 MG EC tablet Take 1 tablet (81 mg total) by mouth daily. 90 tablet 3  . Cholecalciferol (VITAMIN D-3) 125 MCG (5000 UT) TABS Take 2 PO qd x 3 months, then 1 PO qd long-term after that 180 tablet 3  . diclofenac Sodium (VOLTAREN) 1 % GEL APPLY 2 G TOPICALLY 4 (FOUR) TIMES DAILY. 100 g 1  . ferrous gluconate (FERGON) 324 MG tablet Take 1 tablet (324 mg total) by mouth daily with breakfast. 30 tablet 2  . furosemide (LASIX) 40 MG tablet Take 1 tablet (40 mg total) by mouth daily. 90 tablet 1  . gabapentin (NEURONTIN) 300 MG capsule Take 2 capsules (600 mg total) by mouth 2 (two) times daily. 120 capsule 3  . lactulose, encephalopathy, (CHRONULAC) 10 GM/15ML SOLN Take 20 g by mouth 2 (two) times daily.    Marland Kitchen lactulose, encephalopathy, (CHRONULAC) 10 GM/15ML SOLN TAKE 30 MLS (20 G TOTAL) BY MOUTH 2 (TWO) TIMES DAILY. 1419 mL 0  . lidocaine (LIDODERM) 5 % Place 1 patch onto the skin daily. Remove & Discard patch within 12 hours or as directed by MD 30 patch 0  . magnesium oxide (MAG-OX) 400 (241.3 Mg) MG tablet Take 1 tablet (400 mg total) by mouth daily. 100 tablet 3  . metoprolol succinate (TOPROL-XL) 25 MG 24 hr tablet Take 1 tablet (25 mg total) by mouth daily. 90 tablet 0  . OSCIMIN 0.125 MG SUBL PLACE 1 TABLET (0.125 MG TOTAL) UNDER THE TONGUE EVERY 8 (EIGHT) HOURS AS NEEDED. 30 tablet 1  . pantoprazole (PROTONIX) 40 MG tablet Take 1 tablet (40 mg total) by mouth in the morning. 90 tablet 0  . spironolactone (ALDACTONE) 100 MG tablet Take 1 tablet (100 mg total) by mouth daily. 30 tablet 0   No current facility-administered medications on file prior to visit.    ROS: See HPI  Observations/Objective: Awake, alert, oriented x3 Not in acute distress Normal mood  Assessment and Plan: 1. Abnormal CT of the abdomen We will need to exclude malignancy MRI abdomen ordered and he has an  upcoming appointment  2. Alcoholic cirrhosis of liver with ascites (HCC) Currently on spironolactone, Protonix Followed by GI  3. 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  Dispense: 30 capsule; Refill: 3 - cyclobenzaprine (FLEXERIL) 10 MG tablet; Take 1 tablet (10 mg total) by mouth at bedtime.  Dispense: 30 tablet; Refill: 0   Follow Up Instructions: Keep previously scheduled appointment   I discussed the assessment and treatment plan with the patient. The patient was provided an opportunity to ask questions and all were answered. The patient agreed with the plan and demonstrated an understanding of the instructions.   The patient was advised to call back or seek an in-person evaluation if the symptoms worsen or if the condition fails to improve as anticipated.     I provided 11 minutes total of non-face-to-face time during this encounter.   Charlott Rakes, MD, FAAFP.  [facility] [place], [place]   12/16/2020, 3:39 PM

## 2020-12-16 NOTE — Progress Notes (Signed)
Discuss recent Ct results. Wants to discuss pain medication.

## 2020-12-17 ENCOUNTER — Telehealth: Payer: Self-pay

## 2020-12-17 NOTE — Telephone Encounter (Signed)
Patients mother wants a call back to get a clear explanations of her sons health. Patient mother states she has spoke with her son but he can not really tell her what he talked about with his PCP.  Please call patient mother Richard [name] (Mother) [phone]

## 2020-12-19 ENCOUNTER — Other Ambulatory Visit: Payer: Self-pay

## 2020-12-19 ENCOUNTER — Ambulatory Visit: Payer: Medicaid Other | Admitting: Physical Therapy

## 2020-12-19 DIAGNOSIS — R293 Abnormal posture: Secondary | ICD-10-CM | POA: Diagnosis not present

## 2020-12-19 DIAGNOSIS — R2689 Other abnormalities of gait and mobility: Secondary | ICD-10-CM

## 2020-12-19 DIAGNOSIS — M6281 Muscle weakness (generalized): Secondary | ICD-10-CM

## 2020-12-19 NOTE — Therapy (Signed)
[place], [place], [address] Phone: [phone]   Fax:  [phone]  Physical Therapy Treatment  Patient Details  Name: Richard [name] MRN: [id number] Date of Birth: 02/18/1966 Referring Provider (PT): Eunice Blase, MD   Encounter Date: 12/19/2020   PT End of Session - 12/19/20 1122    Visit Number 2    Number of Visits 13    Date for PT Re-Evaluation 01/18/21    PT Start Time 2423    PT Stop Time 1200    PT Time Calculation (min) 39 min    Equipment Utilized During Treatment Gait belt    Activity Tolerance Patient tolerated treatment well    Behavior During Therapy [facility] for tasks assessed/performed           Past Medical History:  Diagnosis Date  . Abnormal nuclear cardiac imaging test   . Acid reflux   . Acute pancreatitis 08/14/2018  . Alcohol withdrawal delirium ([place]) 08/20/2016  . Alcoholic cardiomyopathy ([place]) 01/07/2020  . Alcoholic hepatitis   . Alcoholic ketoacidosis 5/36/1443  . Ascites 12/13/2019  . Aspiration pneumonia ([place]) 08/20/2016  . Chest pain of uncertain etiology   . Chronic systolic CHF (congestive heart failure) ([place])   . Cirrhosis ([place])   . Colon cancer ([place])   . DCM (dilated cardiomyopathy) ([place])   . Drug abuse ([place]) 01/07/2020  . Elevated troponin 06/27/2018  . ETOH abuse   . 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 Heme positive stool 11/13/2017  . Hepatic steatosis 08/21/2016  . High anion gap metabolic acidosis 10/07/4006  . History of colon cancer   . HTN (hypertension)   . Hypertensive urgency 06/27/2018  . Hypoglycemia 06/27/2018  . Hypokalemia 01/07/2020  . Hypomagnesemia   . Hypophosphatemia   . Lactic acidosis 08/20/2016  . Leukocytosis 01/07/2020  . Pancreatitis 08/2018  . Polyp of ascending colon   . Prolonged Q-T interval on ECG   . Prolonged QT interval   . Protein-calorie malnutrition, severe 05/02/2018  . PUD (peptic ulcer disease)   . SBP (spontaneous bacterial  peritonitis) ([place]) 01/07/2020  . Sepsis ([place]) 08/20/2016  . Septal infarction ([place]) 01/07/2020  . SVT (supraventricular tachycardia) ([place])   . Thrombocytopenia ([place]) 08/21/2016    Past Surgical History:  Procedure Laterality Date  . BIOPSY  12/14/2019   Procedure: BIOPSY;  Surgeon: Mauri Pole, MD;  Location: [facility];  Service: Endoscopy;;  . catherization  2007  . COLONOSCOPY WITH PROPOFOL N/A 12/14/2019   Procedure: COLONOSCOPY WITH PROPOFOL;  Surgeon: Mauri Pole, MD;  Location: [facility];  Service: Endoscopy;  Laterality: N/A;  . ESOPHAGOGASTRODUODENOSCOPY (EGD) WITH PROPOFOL N/A 12/14/2019   Procedure: ESOPHAGOGASTRODUODENOSCOPY (EGD) WITH PROPOFOL;  Surgeon: Mauri Pole, MD;  Location: [facility];  Service: Endoscopy;  Laterality: N/A;  . HERNIA REPAIR  1969   1 x at birth and at 55 years old  . LAPAROSCOPIC SIGMOID COLECTOMY  2007  . OPEN REDUCTION INTERNAL FIXATION (ORIF) HAND Right 2012   3rd  digit  . POLYPECTOMY  12/14/2019   Procedure: POLYPECTOMY;  Surgeon: Mauri Pole, MD;  Location: [facility];  Service: Endoscopy;;    There were no vitals filed for this visit.   Subjective Assessment - 12/19/20 1122    Subjective "I feel like my back is better, i've been doing my exericse. My foot is giving me more issues and is keeping me up last night, I ended up falling going to the bathroom becuase I  couldn't feel my feet."              [facility] PT Assessment - 12/19/20 0001      Assessment   Medical Diagnosis Bilateral leg pain (M79.604, M79.605), Chronic bilateral low back pain, unspecified whether sciatica present (M54.50, G89.29), Neuropathy (G62.9)                         [facility] Adult PT Treatment/Exercise - 12/19/20 0001      Lumbar Exercises: Seated   Other Seated Lumbar Exercises seated thoracic rotation 2 x 10 hugging black physioball    Other Seated Lumbar Exercises anterior pelvic tilt 1  x 10 10 sec       Knee/Hip Exercises: Aerobic   Nustep L5 x 5 min Ue/LE      Knee/Hip Exercises: Standing   Gait Training 4 x 30 ft with heavy cues on heel strike/ toe off taking smaller steps and using SPC in RUE and using it when he steps withis LLE      Knee/Hip Exercises: Seated   Marching Both;1 set;10 reps   alternating L/R maintaining anterior pelvic tilt                 PT Education - 12/19/20 1150    Education Details reviewed HEP and gait biomechanics.    Person(s) Educated Patient    Methods Explanation;Verbal cues    Comprehension Verbalized understanding;Verbal cues required            PT Short Term Goals - 12/07/20 1242      PT SHORT TERM GOAL #1   Title pt to IND with inital HEP    Baseline no previous HEP    Time 3    Period Weeks    Status New    Target Date 12/28/20      PT SHORT TERM GOAL #2   Title pt to verbalize/ demo efficient posture and lifting mechanics to reduce and prevent back    Baseline no knowledge of efficient posture    Time 3    Period Weeks    Status New    Target Date 12/28/20             PT Long Term Goals - 12/07/20 1243      PT LONG TERM GOAL #1   Title pt to increase trunk flexion to >/= 70 degree and extension by >/= 6 degrees with </= 4/10 max pain to promote functional trunk mobility    Baseline see flowsheet    Time 6    Period Weeks    Status New    Target Date 01/18/21      PT LONG TERM GOAL #2   Title increase gross hip strength to >/= 4/5 to promote standing/ walking stability and maximize safety    Baseline see flowsheet    Time 6    Period Weeks    Status New    Target Date 01/18/21      PT LONG TERM GOAL #3   Title pt to be able to sit ,stand and walk for >/= 30 min for in home and short community distances with </= 5/10 max pain    Baseline able to sit 10 min, stand 10 min and walk 5-10 min    Time 6    Period Weeks    Status New    Target Date 01/18/21      PT LONG TERM GOAL #4   Title  pt to be able to  lay down and report max pain to </= 5/10 to demo improvement in condition and QOL    Baseline max pain 10 at night and with laying down    Time 6    Period Weeks    Status New    Target Date 01/18/21      PT LONG TERM GOAL #5   Title pt to be IND with all HEP given and is able to maintain and progress current LOF IND    Baseline no previous HEP    Time 6    Period Weeks    Status New    Target Date 01/18/21                 Plan - 12/19/20 1151    Clinical Impression Statement pt reports improvement in his low back but notes his biggest issue is his feet reporting he can't feel them which resulted in falling yesterday when he was getting out of bed to use the bath room. Reviewed HEP which he notes compliance and he demonstrates improvement in sitting posture , compared to previous session. time spent working on gait training wiht [place] and proper mechanics which heavy verba/ tactile cues were needed as well as demonstration. continued working on hip/ core strengthening which he did well with reporting decreased pain.    PT Treatment/Interventions ADLs/Self Care Home Management;Cryotherapy;Electrical Stimulation;Iontophoresis 4mg /ml Dexamethasone;Ultrasound;Traction;Therapeutic activities;Therapeutic exercise;Neuromuscular re-education;Manual techniques;Patient/family education;Taping;Passive range of motion;Dry needling;Moist Heat;Balance training    PT Next Visit Plan review/ update HEP - posture education, hip and core strenghteing, review gait training,  He doesn't toleate laying supine well may need wedge.    PT Home Exercise Plan [id number] - LTR, seated anterior pelvic tilt, seated march, sidelying hip abduction, seated abdominal set    Consulted and Agree with Plan of Care Patient           Patient will benefit from skilled therapeutic intervention in order to improve the following deficits and impairments:  Improper body mechanics,Increased muscle spasms,Obesity,Pain,Decreased  activity tolerance,Decreased endurance,Decreased balance,Decreased range of motion  Visit Diagnosis: Abnormal posture  Other abnormalities of gait and mobility  Muscle weakness (generalized)     Problem List Patient Active Problem List   Diagnosis Date Noted  . H/O ETOH abuse 08/25/2020  . Hepatic cirrhosis ([place])   . Abnormal nuclear cardiac imaging test   . Chest pain of uncertain etiology   . Alcoholic hepatitis 38/75/6433  . Hypokalemia 01/07/2020  . Leukocytosis 01/07/2020  . ETOH abuse 01/07/2020  . Alcoholic cardiomyopathy ([place]) 01/07/2020  . Drug abuse ([place]) 01/07/2020  . SBP (spontaneous bacterial peritonitis) ([place]) 01/07/2020  . Tobacco abuse 01/07/2020  . Septal infarction ([place]) 01/07/2020  . Prolonged QT interval   . Prolonged Q-T interval on ECG   . DCM (dilated cardiomyopathy) ([place])   . Chronic systolic CHF (congestive heart failure) ([place])   . Polyp of ascending colon   . History of colon cancer   . Ascites 12/13/2019  . SVT (supraventricular tachycardia) ([place])   . Hypomagnesemia   . Hypophosphatemia   . High anion gap metabolic acidosis 29/51/8841  . Acute pancreatitis 08/14/2018  . Smoker 08/14/2018  . 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 HTN (hypertension) 08/14/2018  . Abdominal pain 06/27/2018  . Hypertensive urgency 06/27/2018  . Elevated troponin 06/27/2018  . Hypoglycemia 06/27/2018  . Protein-calorie malnutrition, severe 05/02/2018  . Pancreatitis 05/01/2018  . Heme positive stool 11/13/2017  . Alcoholic ketoacidosis 66/03/3015  . Hepatic steatosis  08/21/2016  . Thrombocytopenia ([place]) 08/21/2016  . Alcohol abuse 08/21/2016  . Alcohol withdrawal delirium ([place]) 08/20/2016  . Dehydration 08/20/2016  . Intractable nausea and vomiting 08/20/2016  . Lactic acidosis 08/20/2016  . Aspiration pneumonia ([place]) 08/20/2016  . Sepsis ([place]) 08/20/2016   Starr Lake PT, DPT, LAT, ATC  12/19/20  12:03 PM      [place]  [facility] [address] [place], [place], [address] Phone: [phone]   Fax:  [phone]  Name: Tyre Beaver MRN: [id number] Date of Birth: 1966-09-16

## 2020-12-21 ENCOUNTER — Ambulatory Visit: Payer: Medicaid Other | Admitting: Physical Therapy

## 2020-12-22 ENCOUNTER — Telehealth: Payer: Self-pay | Admitting: Family Medicine

## 2020-12-22 NOTE — Telephone Encounter (Signed)
Copied from [place] [phone]. Topic: General - Inquiry >> Dec 21, 2020 10:57 AM Scherrie Gerlach wrote: Reason for CRM: precert center called and need the precert done on the pt's MRI scheduled wed, 3/23  Ula Lingo [phone] ext  [phone] >> Dec 22, 2020  8:35 AM [name] wrote: Ula Lingo is calling again because she has not heard back from the nurse or doctor.  She stated that she needs  The information because patient is coming in for test on 3/23.

## 2020-12-22 NOTE — Telephone Encounter (Signed)
Appointment has been rescheduled due to PA pending with insurance.

## 2020-12-23 ENCOUNTER — Ambulatory Visit (HOSPITAL_COMMUNITY): Payer: Medicaid Other

## 2020-12-26 ENCOUNTER — Encounter: Payer: Self-pay | Admitting: Physical Therapy

## 2020-12-26 ENCOUNTER — Ambulatory Visit: Payer: Medicaid Other | Admitting: Physical Therapy

## 2020-12-26 ENCOUNTER — Other Ambulatory Visit: Payer: Self-pay

## 2020-12-26 DIAGNOSIS — R293 Abnormal posture: Secondary | ICD-10-CM | POA: Diagnosis not present

## 2020-12-26 DIAGNOSIS — M6281 Muscle weakness (generalized): Secondary | ICD-10-CM

## 2020-12-26 DIAGNOSIS — R2689 Other abnormalities of gait and mobility: Secondary | ICD-10-CM

## 2020-12-26 NOTE — Therapy (Signed)
[place], [place], [address] Phone: [phone]   Fax:  [phone]  Physical Therapy Treatment  Patient Details  Name: Richard [name] MRN: [id number] Date of Birth: 11/02/1965 Referring Provider (PT): Eunice Blase, MD   Encounter Date: 12/26/2020   PT End of Session - 12/26/20 1111    Visit Number 3    Number of Visits 13    Date for PT Re-Evaluation 01/18/21    Authorization Type [employer] MCD    Authorization Time Period 12/14/2020 - 01/30/2021    Authorization - Visit Number 2    Authorization - Number of Visits 12    PT Start Time 1110    PT Stop Time 1153    PT Time Calculation (min) 43 min    Activity Tolerance Patient tolerated treatment well    Behavior During Therapy [facility] for tasks assessed/performed           Past Medical History:  Diagnosis Date  . Abnormal nuclear cardiac imaging test   . Acid reflux   . Acute pancreatitis 08/14/2018  . Alcohol withdrawal delirium ([place]) 08/20/2016  . 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 Alcoholic hepatitis   . Alcoholic ketoacidosis 7/89/3810  . Ascites 12/13/2019  . Aspiration pneumonia ([place]) 08/20/2016  . Chest pain of uncertain etiology   . Chronic systolic CHF (congestive heart failure) ([place])   . Cirrhosis ([place])   . Colon cancer ([place])   . DCM (dilated cardiomyopathy) ([place])   . Drug abuse ([place]) 01/07/2020  . Elevated troponin 06/27/2018  . ETOH abuse   . 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 Heme positive stool 11/13/2017  . Hepatic steatosis 08/21/2016  . High anion gap metabolic acidosis 10/09/5100  . History of colon cancer   . HTN (hypertension)   . Hypertensive urgency 06/27/2018  . Hypoglycemia 06/27/2018  . Hypokalemia 01/07/2020  . Hypomagnesemia   . Hypophosphatemia   . Lactic acidosis 08/20/2016  . Leukocytosis 01/07/2020  . Pancreatitis 08/2018  . Polyp of ascending colon   . Prolonged Q-T interval on ECG   . Prolonged QT  interval   . Protein-calorie malnutrition, severe 05/02/2018  . PUD (peptic ulcer disease)   . SBP (spontaneous bacterial peritonitis) ([place]) 01/07/2020  . Sepsis ([place]) 08/20/2016  . 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 SVT (supraventricular tachycardia) ([place])   . Thrombocytopenia ([place]) 08/21/2016    Past Surgical History:  Procedure Laterality Date  . BIOPSY  12/14/2019   Procedure: BIOPSY;  Surgeon: Mauri Pole, MD;  [facility]: [facility];  Service: Endoscopy;;  . catherization  2007  . COLONOSCOPY WITH PROPOFOL N/A 12/14/2019   Procedure: COLONOSCOPY WITH PROPOFOL;  Surgeon: Mauri Pole, MD;  [facility];  Service: Endoscopy;  Laterality: N/A;  . ESOPHAGOGASTRODUODENOSCOPY (EGD) WITH PROPOFOL N/A 12/14/2019   Procedure: ESOPHAGOGASTRODUODENOSCOPY (EGD) WITH PROPOFOL;  Surgeon: Mauri Pole, MD;  [facility];  Service: Endoscopy;  Laterality: N/A;  . HERNIA REPAIR  1969   1 x at birth and at 55 years old  . LAPAROSCOPIC SIGMOID COLECTOMY  2007  . OPEN REDUCTION INTERNAL FIXATION (ORIF) HAND Right 2012   3rd  digit  . POLYPECTOMY  12/14/2019   Procedure: POLYPECTOMY;  Surgeon: Mauri Pole, MD;  [facility];  Service: Endoscopy;;    There were no vitals filed for this visit.   Subjective Assessment - 12/26/20 1112    Subjective "I've been frustrated with my other providers with my medicaid number.  I've been working on my posture."    Patient Stated Goals decrease pain, get back to normal function,    Currently in Pain? Yes    Pain Score 8     Pain Location Back    Pain Onset More than a month ago    Pain Frequency Intermittent    Pain Relieving Factors rubbing the ankle/ foot, warm water and bath.              [facility] PT Assessment - 12/26/20 0001      Assessment   Medical Diagnosis Bilateral leg pain (M79.604, M79.605), Chronic bilateral low back pain, unspecified whether sciatica present (M54.50, G89.29),  Neuropathy (G62.9)    Referring Provider (PT) Eunice Blase, MD                         [facility] Adult PT Treatment/Exercise - 12/26/20 0001      Lumbar Exercises: Seated   Other Seated Lumbar Exercises pressing through bil UE with black physioball on lap 2 x 10 holding 2-3 seconds   cues and demonstration for proper form     Knee/Hip Exercises: Aerobic   Nustep L5 x 5 min LE only      Knee/Hip Exercises: Standing   Hip Abduction 2 sets;10 reps;Knee straight   HHA on freemotion machine   Hip Extension 2 sets;10 reps;Knee straight;Stengthening;Both   with bil HHA on free motion   Gait Training 4 x 50 ft using SPC in RUE to help support the LLE   mod verbal cues/ demonstration for proper form     Knee/Hip Exercises: Seated   Long Arc Quad Strengthening;2 sets;10 reps    Long Arc Quad Weight 3 lbs.    Marching Strengthening;Both;2 sets;10 reps    Marching Weights 3 lbs.    Sit to Sand 2 sets;10 reps;without UE support   from hi-lo table with table lowered between sets                   PT Short Term Goals - 12/07/20 1242      PT SHORT TERM GOAL #1   Title pt to IND with inital HEP    Baseline no previous HEP    Time 3    Period Weeks    Status New    Target Date 12/28/20      PT SHORT TERM GOAL #2   Title pt to verbalize/ demo efficient posture and lifting mechanics to reduce and prevent back    Baseline no knowledge of efficient posture    Time 3    Period Weeks    Status New    Target Date 12/28/20             PT Long Term Goals - 12/07/20 1243      PT LONG TERM GOAL #1   Title pt to increase trunk flexion to >/= 70 degree and extension by >/= 6 degrees with </= 4/10 max pain to promote functional trunk mobility    Baseline see flowsheet    Time 6    Period Weeks    Status New    Target Date 01/18/21      PT LONG TERM GOAL #2   Title increase gross hip strength to >/= 4/5 to promote standing/ walking stability and maximize safety     Baseline see flowsheet    Time 6    Period Weeks    Status New    Target  Date 01/18/21      PT LONG TERM GOAL #3   Title pt to be able to sit ,stand and walk for >/= 30 min for in home and short community distances with </= 5/10 max pain    Baseline able to sit 10 min, stand 10 min and walk 5-10 min    Time 6    Period Weeks    Status New    Target Date 01/18/21      PT LONG TERM GOAL #4   Title pt to be able to lay down and report max pain to </= 5/10 to demo improvement in condition and QOL    Baseline max pain 10 at night and with laying down    Time 6    Period Weeks    Status New    Target Date 01/18/21      PT LONG TERM GOAL #5   Title pt to be IND with all HEP given and is able to maintain and progress current LOF IND    Baseline no previous HEP    Time 6    Period Weeks    Status New    Target Date 01/18/21                 Plan - 12/26/20 1142    Clinical Impression Statement pt arrives reporting he feesl like he is doing better. He continues to demo abnormal gait pattern using step to pattern and SPC on the wrong side, He is able to correct today with cues and intermittent mod verbal cues/ demo for proper technique. continued focus on hip/ core strengtheing which he requires intermittent cues to slow down. end of session he noted feeling better.    PT Treatment/Interventions ADLs/Self Care Home Management;Cryotherapy;Electrical Stimulation;Iontophoresis 4mg /ml Dexamethasone;Ultrasound;Traction;Therapeutic activities;Therapeutic exercise;Neuromuscular re-education;Manual techniques;Patient/family education;Taping;Passive range of motion;Dry needling;Moist Heat;Balance training    PT Next Visit Plan review/ update HEP - posture education, hip and core strenghteing, review gait training,  He doesn't toleate laying supine well may need wedge.    PT Home Exercise Plan [id number] - LTR, seated anterior pelvic tilt, seated march, sidelying hip abduction, seated abdominal  set, standing hip abduction/ ext,    Consulted and Agree with Plan of Care Patient           Patient will benefit from skilled therapeutic intervention in order to improve the following deficits and impairments:  Improper body mechanics,Increased muscle spasms,Obesity,Pain,Decreased activity tolerance,Decreased endurance,Decreased balance,Decreased range of motion  Visit Diagnosis: Abnormal posture  Other abnormalities of gait and mobility  Muscle weakness (generalized)     Problem List Patient Active Problem List   Diagnosis Date Noted  . H/O ETOH abuse 08/25/2020  . Hepatic cirrhosis ([place])   . Abnormal nuclear cardiac imaging test   . Chest pain of uncertain etiology   . Alcoholic hepatitis 61/44/3154  . Hypokalemia 01/07/2020  . Leukocytosis 01/07/2020  . ETOH abuse 01/07/2020  . 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 Drug abuse ([place]) 01/07/2020  . SBP (spontaneous bacterial peritonitis) ([place]) 01/07/2020  . Tobacco abuse 01/07/2020  . 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 Prolonged QT interval   . Prolonged Q-T interval on ECG   . DCM (dilated cardiomyopathy) ([place])   . Chronic systolic CHF (congestive heart failure) ([place])   . Polyp of ascending colon   . History of colon cancer   . Ascites 12/13/2019  . SVT (supraventricular tachycardia) ([place])   . Hypomagnesemia   . Hypophosphatemia   . High  anion gap metabolic acidosis 67/54/4920  . Acute pancreatitis 08/14/2018  . Smoker 08/14/2018  . 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 HTN (hypertension) 08/14/2018  . Abdominal pain 06/27/2018  . Hypertensive urgency 06/27/2018  . Elevated troponin 06/27/2018  . Hypoglycemia 06/27/2018  . Protein-calorie malnutrition, severe 05/02/2018  . Pancreatitis 05/01/2018  . Heme positive stool 11/13/2017  . Alcoholic ketoacidosis 07/09/1218  . Hepatic steatosis 08/21/2016  . Thrombocytopenia ([place]) 08/21/2016  . Alcohol abuse 08/21/2016  . Alcohol withdrawal delirium ([place])  08/20/2016  . Dehydration 08/20/2016  . Intractable nausea and vomiting 08/20/2016  . Lactic acidosis 08/20/2016  . Aspiration pneumonia ([place]) 08/20/2016  . Sepsis ([place]) 08/20/2016   [place]arr [place] PT, DPT, LAT, ATC  12/26/20  11:57 AM      [facility] [address] [place], [place], [address] Phone: [phone]   Fax:  [phone]  Name: Richard [name] MRN: [id number] Date of Birth: [birth year]

## 2020-12-28 ENCOUNTER — Ambulatory Visit: Payer: Medicaid Other | Admitting: Physical Therapy

## 2020-12-30 ENCOUNTER — Ambulatory Visit: Payer: Medicaid Other | Admitting: Physical Therapy

## 2020-12-30 ENCOUNTER — Other Ambulatory Visit: Payer: Self-pay

## 2020-12-30 ENCOUNTER — Telehealth: Payer: Self-pay | Admitting: Family Medicine

## 2020-12-30 ENCOUNTER — Encounter: Payer: Self-pay | Admitting: Physical Therapy

## 2020-12-30 DIAGNOSIS — R2689 Other abnormalities of gait and mobility: Secondary | ICD-10-CM

## 2020-12-30 DIAGNOSIS — M6281 Muscle weakness (generalized): Secondary | ICD-10-CM

## 2020-12-30 DIAGNOSIS — K0889 Other specified disorders of teeth and supporting structures: Secondary | ICD-10-CM

## 2020-12-30 DIAGNOSIS — R293 Abnormal posture: Secondary | ICD-10-CM

## 2020-12-30 NOTE — Telephone Encounter (Signed)
Copied from [place] [id number]. Topic: General - Other >> Dec 29, 2020 12:57 PM Lennox Solders wrote: Reason for CRM: Pt is calling and would like dr Margarita Rana to recommend him a dentist due to trouble chewing his food and holes in his teeth. Pt has [employer] healthy blue medicaid.

## 2020-12-30 NOTE — Therapy (Signed)
[place], [place], [address] Phone: [phone]   Fax:  [phone]  Physical Therapy Treatment  Patient Details  Name: Richard [name] MRN: [id number] Date of Birth: 07-24-1966 Referring Provider (PT): Eunice Blase, MD   Encounter Date: 12/30/2020   PT End of Session - 12/30/20 1332    Visit Number 4    Number of Visits 13    Date for PT Re-Evaluation 01/18/21    Authorization Type [employer] MCD    Authorization Time Period 12/14/2020 - 01/30/2021    Authorization - Visit Number 3    Authorization - Number of Visits 12    PT Start Time 5027    PT Stop Time 1356    PT Time Calculation (min) 38 min           Past Medical History:  Diagnosis Date  . Abnormal nuclear cardiac imaging test   . Acid reflux   . Acute pancreatitis 08/14/2018  . Alcohol withdrawal delirium ([place]) 08/20/2016  . 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 Alcoholic hepatitis   . Alcoholic ketoacidosis 7/41/2878  . Ascites 12/13/2019  . Aspiration pneumonia ([place]) 08/20/2016  . Chest pain of uncertain etiology   . Chronic systolic CHF (congestive heart failure) ([place])   . Cirrhosis ([place])   . Colon cancer ([place])   . DCM (dilated cardiomyopathy) ([place])   . Drug abuse ([place]) 01/07/2020  . Elevated troponin 06/27/2018  . ETOH abuse   . 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 Heme positive stool 11/13/2017  . Hepatic steatosis 08/21/2016  . High anion gap metabolic acidosis 03/10/6719  . History of colon cancer   . HTN (hypertension)   . Hypertensive urgency 06/27/2018  . Hypoglycemia 06/27/2018  . Hypokalemia 01/07/2020  . Hypomagnesemia   . Hypophosphatemia   . Lactic acidosis 08/20/2016  . Leukocytosis 01/07/2020  . Pancreatitis 08/2018  . Polyp of ascending colon   . Prolonged Q-T interval on ECG   . Prolonged QT interval   . Protein-calorie malnutrition, severe 05/02/2018  . PUD (peptic ulcer disease)   . SBP (spontaneous  bacterial peritonitis) ([place]) 01/07/2020  . Sepsis ([place]) 08/20/2016  . 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 SVT (supraventricular tachycardia) ([place])   . Thrombocytopenia ([place]) 08/21/2016    Past Surgical History:  Procedure Laterality Date  . BIOPSY  12/14/2019   Procedure: BIOPSY;  Surgeon: Mauri Pole, MD;  [facility];  Service: Endoscopy;;  . catherization  2007  . COLONOSCOPY WITH PROPOFOL N/A 12/14/2019   Procedure: COLONOSCOPY WITH PROPOFOL;  Surgeon: Mauri Pole, MD;  [facility];  Service: Endoscopy;  Laterality: N/A;  . ESOPHAGOGASTRODUODENOSCOPY (EGD) WITH PROPOFOL N/A 12/14/2019   Procedure: ESOPHAGOGASTRODUODENOSCOPY (EGD) WITH PROPOFOL;  Surgeon: Mauri Pole, MD;  [facility];  Service: Endoscopy;  Laterality: N/A;  . HERNIA REPAIR  1969   1 x at birth and at 55 years old  . LAPAROSCOPIC SIGMOID COLECTOMY  2007  . OPEN REDUCTION INTERNAL FIXATION (ORIF) HAND Right 2012   3rd  digit  . POLYPECTOMY  12/14/2019   Procedure: POLYPECTOMY;  Surgeon: Mauri Pole, MD;  [facility];  Service: Endoscopy;;    There were no vitals filed for this visit.   Subjective Assessment - 12/30/20 1323    Subjective I am doing okay as far as my back. I noticed that if I take my shoes off at night to sleep my feet and lower legs hurt really bad.  If I keep my shoes on while I sleep, they do not hurt has bad. This started after after they took fluid off my stomach. Right foot and ankle are sore during the day. At night , pain runs up into my legs.    Pain Score 0-No pain    Pain Location Back    Pain Score 5    Pain Location Ankle    Pain Orientation Right    Pain Descriptors / Indicators Burning   hurts   Pain Type Chronic pain    Aggravating Factors  standing and walking, day time hours    Pain Relieving Factors evening time                             [facility] Adult PT Treatment/Exercise - 12/30/20 0001       Lumbar Exercises: Seated   Other Seated Lumbar Exercises seated thoracic rotation 2 x 10 hugging black physioball, seated physioball roll out for lumbar stretch x 10    Other Seated Lumbar Exercises pressing through bil UE with black physioball on lap 2 x 10 holding 2-3 seconds   cues and demonstration for proper form     Knee/Hip Exercises: Aerobic   Nustep L5 x 5 min LE only      Knee/Hip Exercises: Standing   Hip Abduction 2 sets;10 reps;Knee straight   HHA on freemotion machine   Hip Extension 2 sets;10 reps;Knee straight;Stengthening;Both   with bil HHA on free motion   Gait Training pt demonstrates appropriate step through pattern and sequencing with SPC in clinc      Knee/Hip Exercises: Seated   Long Arc Quad Strengthening;10 reps;3 sets    [employer] Weight 4 lbs.    Marching Strengthening;Both;2 sets;10 reps    Marching Weights 4 lbs.    Sit to [employer] 10 reps   without UE, from mat table                   PT Short Term Goals - 12/07/20 1242      PT SHORT TERM GOAL #1   Title pt to IND with inital HEP    Baseline no previous HEP    Time 3    Period Weeks    Status New    Target Date 12/28/20      PT SHORT TERM GOAL #2   Title pt to verbalize/ demo efficient posture and lifting mechanics to reduce and prevent back    Baseline no knowledge of efficient posture    Time 3    Period Weeks    Status New    Target Date 12/28/20             PT Long Term Goals - 12/07/20 1243      PT LONG TERM GOAL #1   Title pt to increase trunk flexion to >/= 70 degree and extension by >/= 6 degrees with </= 4/10 max pain to promote functional trunk mobility    Baseline see flowsheet    Time 6    Period Weeks    Status New    Target Date 01/18/21      PT LONG TERM GOAL #2   Title increase gross hip strength to >/= 4/5 to promote standing/ walking stability and maximize safety    Baseline see flowsheet    Time 6    Period Weeks    Status New    Target Date  01/18/21      PT LONG TERM GOAL #3   Title pt to be able to sit ,stand and walk for >/= 30 min for in home and short community distances with </= 5/10 max pain    Baseline able to sit 10 min, stand 10 min and walk 5-10 min    Time 6    Period Weeks    Status New    Target Date 01/18/21      PT LONG TERM GOAL #4   Title pt to be able to lay down and report max pain to </= 5/10 to demo improvement in condition and QOL    Baseline max pain 10 at night and with laying down    Time 6    Period Weeks    Status New    Target Date 01/18/21      PT LONG TERM GOAL #5   Title pt to be IND with all HEP given and is able to maintain and progress current LOF IND    Baseline no previous HEP    Time 6    Period Weeks    Status New    Target Date 01/18/21                 Plan - 12/30/20 1333    Clinical Impression Statement Pt reports night time is difficult with foot pain that radiates into legs. He reports no back pain today and notes overall improvement with correcting his posture. He demstrates improved gait pattern without cues in clnic. Continued with closed and open chain LE strength and seated core strengthening. Reviewed seated stretches and added seated lumbar flexon stretch wtih physioball. Sesson tolerated well with some increased pain reported during standing hip abduction. He reports compliance with his HEP.    PT Next Visit Plan review/ update HEP - posture education, hip and core strenghteing, review gait training,  He doesn't toleate laying supine well may need wedge.    PT Home Exercise Plan [id number] - LTR, seated anterior pelvic tilt, seated march, sidelying hip abduction, seated abdominal set, standing hip abduction/ ext,           Patient will benefit from skilled therapeutic intervention in order to improve the following deficits and impairments:  Improper body mechanics,Increased muscle spasms,Obesity,Pain,Decreased activity tolerance,Decreased endurance,Decreased  balance,Decreased range of motion  Visit Diagnosis: Abnormal posture  Other abnormalities of gait and mobility  Muscle weakness (generalized)     Problem List Patient Active Problem List   Diagnosis Date Noted  . H/O ETOH abuse 08/25/2020  . Hepatic cirrhosis ([place])   . Abnormal nuclear cardiac imaging test   . Chest pain of uncertain etiology   . Alcoholic hepatitis 81/82/9937  . Hypokalemia 01/07/2020  . Leukocytosis 01/07/2020  . ETOH abuse 01/07/2020  . 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 Drug abuse ([place]) 01/07/2020  . SBP (spontaneous bacterial peritonitis) ([place]) 01/07/2020  . Tobacco abuse 01/07/2020  . 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 Prolonged QT interval   . Prolonged Q-T interval on ECG   . DCM (dilated cardiomyopathy) ([place])   . Chronic systolic CHF (congestive heart failure) ([place])   . Polyp of ascending colon   . History of colon cancer   . Ascites 12/13/2019  . SVT (supraventricular tachycardia) ([place])   . Hypomagnesemia   . Hypophosphatemia   . High anion gap metabolic acidosis 16/96/7893  . Acute pancreatitis 08/14/2018  . Smoker 08/14/2018  . 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 HTN (hypertension) 08/14/2018  .  Abdominal pain 06/27/2018  . Hypertensive urgency 06/27/2018  . Elevated troponin 06/27/2018  . Hypoglycemia 06/27/2018  . Protein-calorie malnutrition, severe 05/02/2018  . Pancreatitis 05/01/2018  . Heme positive stool 11/13/2017  . Alcoholic ketoacidosis 34/14/4360  . Hepatic steatosis 08/21/2016  . Thrombocytopenia ([place]) 08/21/2016  . Alcohol abuse 08/21/2016  . Alcohol withdrawal delirium ([place]) 08/20/2016  . Dehydration 08/20/2016  . Intractable nausea and vomiting 08/20/2016  . Lactic acidosis 08/20/2016  . Aspiration pneumonia ([place]) 08/20/2016  . Sepsis ([place]) 08/20/2016    Dorene Ar, PTA 12/30/2020, 2:00 PM  [facility] [address] [place], [place],  [address] Phone: [phone]   Fax:  [phone]  Name: Grantham Hippert MRN: [id number] Date of Birth: 04-12-1966

## 2020-12-31 NOTE — Telephone Encounter (Signed)
Pt requesting dental referral.

## 2021-01-01 ENCOUNTER — Ambulatory Visit (HOSPITAL_COMMUNITY): Payer: Medicaid Other

## 2021-01-01 NOTE — Telephone Encounter (Signed)
Resources has been mailed to pt.

## 2021-01-02 ENCOUNTER — Other Ambulatory Visit: Payer: Self-pay

## 2021-01-02 MED FILL — Hyoscyamine Sulfate SL Tab 0.125 MG: SUBLINGUAL | 10 days supply | Qty: 30 | Fill #0 | Status: CN

## 2021-01-02 MED FILL — Duloxetine HCl Enteric Coated Pellets Cap 60 MG (Base Eq): ORAL | 30 days supply | Qty: 30 | Fill #0 | Status: AC

## 2021-01-02 MED FILL — Cyclobenzaprine HCl Tab 10 MG: ORAL | 30 days supply | Qty: 30 | Fill #0 | Status: AC

## 2021-01-02 MED FILL — Hyoscyamine Sulfate SL Tab 0.125 MG: SUBLINGUAL | 10 days supply | Qty: 30 | Fill #0 | Status: AC

## 2021-01-03 ENCOUNTER — Other Ambulatory Visit: Payer: Self-pay | Admitting: Family Medicine

## 2021-01-03 ENCOUNTER — Other Ambulatory Visit: Payer: Self-pay

## 2021-01-03 DIAGNOSIS — K7682 Hepatic encephalopathy: Secondary | ICD-10-CM

## 2021-01-03 DIAGNOSIS — K7290 Hepatic failure, unspecified without coma: Secondary | ICD-10-CM

## 2021-01-03 MED ORDER — LACTULOSE ENCEPHALOPATHY 10 GM/15ML PO SOLN
ORAL | 0 refills | Status: DC
  Filled 2021-01-03: qty 1419, fill #0

## 2021-01-03 MED FILL — Lactulose (Encephalopathy) Solution 10 GM/15ML: ORAL | 23 days supply | Qty: 1419 | Fill #0 | Status: CN

## 2021-01-03 NOTE — Telephone Encounter (Signed)
Requested Prescriptions  Pending Prescriptions Disp Refills  . lactulose, encephalopathy, (CHRONULAC) 10 GM/15ML SOLN 1419 mL 0    Sig: TAKE 30 MLS (20 G TOTAL) BY MOUTH 2 (TWO) TIMES DAILY.     Gastroenterology:  [place] Passed - 01/03/2021 10:36 AM      Passed - Valid encounter within last 12 months    Recent Outpatient Visits          2 weeks ago Abnormal CT of the abdomen   [place], Charlane Ferretti, MD   4 months ago DCM (dilated cardiomyopathy) [facility])   [place], Charlane Ferretti, MD   5 months ago Chronic right-sided low back pain with bilateral sciatica   [place], MD   6 months ago No-show for appointment   [place], [place], NP   8 months ago Muscle cramps   [place], [place], NP      Future Appointments            In 1 month Charlott Rakes, MD [place]

## 2021-01-04 ENCOUNTER — Encounter: Payer: Self-pay | Admitting: Physical Therapy

## 2021-01-04 ENCOUNTER — Other Ambulatory Visit: Payer: Self-pay

## 2021-01-04 ENCOUNTER — Ambulatory Visit: Payer: Medicaid Other | Attending: Family Medicine | Admitting: Physical Therapy

## 2021-01-04 ENCOUNTER — Ambulatory Visit: Payer: Self-pay

## 2021-01-04 ENCOUNTER — Other Ambulatory Visit: Payer: Self-pay | Admitting: Pharmacist

## 2021-01-04 DIAGNOSIS — K7682 Hepatic encephalopathy: Secondary | ICD-10-CM

## 2021-01-04 DIAGNOSIS — R293 Abnormal posture: Secondary | ICD-10-CM | POA: Diagnosis present

## 2021-01-04 DIAGNOSIS — R2689 Other abnormalities of gait and mobility: Secondary | ICD-10-CM | POA: Diagnosis present

## 2021-01-04 DIAGNOSIS — M6281 Muscle weakness (generalized): Secondary | ICD-10-CM | POA: Diagnosis present

## 2021-01-04 DIAGNOSIS — K7290 Hepatic failure, unspecified without coma: Secondary | ICD-10-CM

## 2021-01-04 MED ORDER — LACTULOSE ENCEPHALOPATHY 10 GM/15ML PO SOLN
ORAL | 0 refills | Status: DC
  Filled 2021-01-04 – 2021-01-29 (×2): qty 1419, 24d supply, fill #0

## 2021-01-04 NOTE — Therapy (Signed)
[facility] [place], [place], [address] Phone: [phone]   Fax:  [phone]  Physical Therapy Treatment  Patient Details  Name: Richard [name] MRN: [id number] Date of Birth: 20-Mar-1966 Referring Provider (PT): Eunice Blase, MD   Encounter Date: 01/04/2021   PT End of Session - 01/04/21 1333    Visit Number 5    Number of Visits 13    Date for PT Re-Evaluation 01/18/21    Authorization Type [employer]    Authorization Time Period 12/14/2020 - 01/30/2021    Authorization - Visit Number 4    Authorization - Number of Visits 12    PT Start Time 2725    PT Stop Time 1400    PT Time Calculation (min) 39 min           Past Medical History:  Diagnosis Date  . Abnormal nuclear cardiac imaging test   . Acid reflux   . Acute pancreatitis 08/14/2018  . Alcohol withdrawal delirium ([place]) 08/20/2016  . 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 Alcoholic hepatitis   . Alcoholic ketoacidosis 3/66/4403  . Ascites 12/13/2019  . Aspiration pneumonia ([place]) 08/20/2016  . Chest pain of uncertain etiology   . Chronic systolic CHF (congestive heart failure) ([place])   . Cirrhosis ([place])   . Colon cancer ([place])   . DCM (dilated cardiomyopathy) ([place])   . Drug abuse ([place]) 01/07/2020  . Elevated troponin 06/27/2018  . ETOH abuse   . 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 Heme positive stool 11/13/2017  . Hepatic steatosis 08/21/2016  . High anion gap metabolic acidosis 01/07/4258  . History of colon cancer   . HTN (hypertension)   . Hypertensive urgency 06/27/2018  . Hypoglycemia 06/27/2018  . Hypokalemia 01/07/2020  . Hypomagnesemia   . Hypophosphatemia   . Lactic acidosis 08/20/2016  . Leukocytosis 01/07/2020  . Pancreatitis 08/2018  . Polyp of ascending colon   . Prolonged Q-T interval on ECG   . Prolonged QT interval   . Protein-calorie malnutrition, severe 05/02/2018  . PUD (peptic ulcer disease)   . SBP (spontaneous bacterial  peritonitis) ([place]) 01/07/2020  . Sepsis ([place]) 08/20/2016  . 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 SVT (supraventricular tachycardia) ([place])   . Thrombocytopenia ([place]) 08/21/2016    Past Surgical History:  Procedure Laterality Date  . BIOPSY  12/14/2019   Procedure: BIOPSY;  Surgeon: Mauri Pole, MD;  Location: [facility];  Service: Endoscopy;;  . catherization  2007  . COLONOSCOPY WITH PROPOFOL N/A 12/14/2019   Procedure: COLONOSCOPY WITH PROPOFOL;  Surgeon: Mauri Pole, MD;  Location: [facility];  Service: Endoscopy;  Laterality: N/A;  . ESOPHAGOGASTRODUODENOSCOPY (EGD) WITH PROPOFOL N/A 12/14/2019   Procedure: ESOPHAGOGASTRODUODENOSCOPY (EGD) WITH PROPOFOL;  Surgeon: Mauri Pole, MD;  Location: [facility];  Service: Endoscopy;  Laterality: N/A;  . HERNIA REPAIR  1969   1 x at birth and at 55 years old  . LAPAROSCOPIC SIGMOID COLECTOMY  2007  . OPEN REDUCTION INTERNAL FIXATION (ORIF) HAND Right 2012   3rd  digit  . POLYPECTOMY  12/14/2019   Procedure: POLYPECTOMY;  Surgeon: Mauri Pole, MD;  Location: [facility];  Service: Endoscopy;;    There were no vitals filed for this visit.   Subjective Assessment - 01/04/21 1326    Subjective Leg pain relaxes the more I move my muscles. I hate to go to sleep , my legs hurt so bad. last night my nephew had to pick me up  to help me to the bathroom because my legs are hurting so much. I am scared to take a step .    Currently in Pain? Yes    Pain Score 4     Pain Location Back    Pain Orientation Lower    Pain Descriptors / Indicators Aching;Sore    Pain Type Chronic pain    Aggravating Factors  laying down, walking, standing for long periods    Pain Relieving Factors warm water    Pain Score 8    Pain Location Foot    Pain Orientation Right    Pain Descriptors / Indicators --   pain   Pain Type Chronic pain    Pain Radiating Towards legs at 6/10    Aggravating Factors  standing and walking, sleeping,  evening time    Pain Relieving Factors day time                             [facility] Adult PT Treatment/Exercise - 01/04/21 0001      Therapeutic Activites    Therapeutic Activities Lifting    Lifting hip hinge, use of wooden dowel , requires max cues to perform correctly.      Knee/Hip Exercises: Stretches   Press photographer 2 reps;20 seconds;Right;Left    Press photographer Limitations runners      Knee/Hip Exercises: Aerobic   Nustep L5 x 7 min LE only      Knee/Hip Exercises: Standing   Heel Raises 10 reps    Heel Raises Limitations heel and toe    Hip Abduction 2 sets;10 reps;Knee straight   HHA on freemotion machine, cues for posture   Abduction Limitations 2#    Hip Extension 2 sets;10 reps;Knee straight;Stengthening;Both   with bil HHA on free motion   Extension Limitations 2#      Knee/Hip Exercises: Seated   Marching Strengthening;Both;2 sets;10 reps    Sit to Sand 10 reps   without UE, from mat table                   PT Short Term Goals - 01/04/21 1335      PT SHORT TERM GOAL #1   Title pt to IND with inital HEP    Baseline independent and consistent with HEP    Time 3    Period Weeks    Status Achieved    Target Date 12/28/20      PT SHORT TERM GOAL #2   Title pt to verbalize/ demo efficient posture and lifting mechanics to reduce and prevent back    Baseline being  mindful of posture, began body mechanics education on4/4/22    Time 3    Period Weeks    Status Partially Met             PT Long Term Goals - 12/07/20 1243      PT LONG TERM GOAL #1   Title pt to increase trunk flexion to >/= 70 degree and extension by >/= 6 degrees with </= 4/10 max pain to promote functional trunk mobility    Baseline see flowsheet    Time 6    Period Weeks    Status New    Target Date 01/18/21      PT LONG TERM GOAL #2   Title increase gross hip strength to >/= 4/5 to promote standing/ walking stability and maximize safety    Baseline  see flowsheet  Time 6    Period Weeks    Status New    Target Date 01/18/21      PT LONG TERM GOAL #3   Title pt to be able to sit ,stand and walk for >/= 30 min for in home and short community distances with </= 5/10 max pain    Baseline able to sit 10 min, stand 10 min and walk 5-10 min    Time 6    Period Weeks    Status New    Target Date 01/18/21      PT LONG TERM GOAL #4   Title pt to be able to lay down and report max pain to </= 5/10 to demo improvement in condition and QOL    Baseline max pain 10 at night and with laying down    Time 6    Period Weeks    Status New    Target Date 01/18/21      PT LONG TERM GOAL #5   Title pt to be IND with all HEP given and is able to maintain and progress current LOF IND    Baseline no previous HEP    Time 6    Period Weeks    Status New    Target Date 01/18/21                 Plan - 01/04/21 1347    Clinical Impression Statement Pt reports compliance with HEP STG# 1 met. He is mindful of posture and we began education on hip hinge alignment with bending. STG# 2 partially met. Used wooden dowel to provide feedback for spine alignment. Began heel /toe raises and gastroc stretches which he reported that he liked the stretches.    PT Next Visit Plan review/ update HEP - posture education, hip and core strenghteing, review gait training,  He doesn't toleate laying supine well may need wedge. consider adding gastroc stretches to HEP    PT Home Exercise Plan [id number] - LTR, seated anterior pelvic tilt, seated march, sidelying hip abduction, seated abdominal set, standing hip abduction/ ext,           Patient will benefit from skilled therapeutic intervention in order to improve the following deficits and impairments:  Improper body mechanics,Increased muscle spasms,Obesity,Pain,Decreased activity tolerance,Decreased endurance,Decreased balance,Decreased range of motion  Visit Diagnosis: Abnormal posture  Other abnormalities  of gait and mobility  Muscle weakness (generalized)     Problem List Patient Active Problem List   Diagnosis Date Noted  . H/O ETOH abuse 08/25/2020  . Hepatic cirrhosis ([place])   . Abnormal nuclear cardiac imaging test   . Chest pain of uncertain etiology   . Alcoholic hepatitis 12/75/1700  . Hypokalemia 01/07/2020  . Leukocytosis 01/07/2020  . ETOH abuse 01/07/2020  . 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 Drug abuse ([place]) 01/07/2020  . SBP (spontaneous bacterial peritonitis) ([place]) 01/07/2020  . Tobacco abuse 01/07/2020  . 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 Prolonged QT interval   . Prolonged Q-T interval on ECG   . DCM (dilated cardiomyopathy) ([place])   . Chronic systolic CHF (congestive heart failure) ([place])   . Polyp of ascending colon   . History of colon cancer   . Ascites 12/13/2019  . SVT (supraventricular tachycardia) ([place])   . Hypomagnesemia   . Hypophosphatemia   . High anion gap metabolic acidosis 17/49/4496  . Acute pancreatitis 08/14/2018  . Smoker 08/14/2018  . 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 HTN (hypertension) 08/14/2018  . Abdominal  pain 06/27/2018  . Hypertensive urgency 06/27/2018  . Elevated troponin 06/27/2018  . Hypoglycemia 06/27/2018  . Protein-calorie malnutrition, severe 05/02/2018  . Pancreatitis 05/01/2018  . Heme positive stool 11/13/2017  . Alcoholic ketoacidosis 01/59/9689  . Hepatic steatosis 08/21/2016  . Thrombocytopenia ([place]) 08/21/2016  . Alcohol abuse 08/21/2016  . Alcohol withdrawal delirium ([place]) 08/20/2016  . Dehydration 08/20/2016  . Intractable nausea and vomiting 08/20/2016  . Lactic acidosis 08/20/2016  . Aspiration pneumonia ([place]) 08/20/2016  . Sepsis ([place]) 08/20/2016    Dorene Ar, PTA 01/04/2021, 2:20 PM  [facility] [address] [place], [place], [address] Phone: [phone]   Fax:  [phone]  Name: Jostin Rue MRN: [id number] Date of  Birth: [birth year]

## 2021-01-04 NOTE — Telephone Encounter (Signed)
Called Pt and provide the location on of mobile unit for Tuesday and Wednesday. Also advise Pt that prescription for Lactulose has been sent to pharmacy.

## 2021-01-04 NOTE — Telephone Encounter (Signed)
Pt. States he started having abdominal pain 3 weeks. States he is taking the lactulose but is having "a hard time paying for my medicine. I can't afford it." He is currently out of lactulose. States he has an appointment with Dr. Margarita Rana in June but wants to be seen sooner. Please advise pt.  Answer Assessment - Initial Assessment Questions 1. LOCATION: "Where does it hurt?"      Lower abdomen 2. RADIATION: "Does the pain shoot anywhere else?" (e.g., chest, back)     Chest and legs 3. ONSET: "When did the pain begin?" (Minutes, hours or days ago)      3 weeks ago 4. SUDDEN: "Gradual or sudden onset?"     Gradual 5. PATTERN "Does the pain come and go, or is it constant?"    - If constant: "Is it getting better, staying the same, or worsening?"      (Note: Constant means the pain never goes away completely; most serious pain is constant and it progresses)     - If intermittent: "How long does it last?" "Do you have pain now?"     (Note: Intermittent means the pain goes away completely between bouts)     Constant 6. SEVERITY: "How bad is the pain?"  (e.g., Scale 1-10; mild, moderate, or severe)    - MILD (1-3): doesn't interfere with normal activities, abdomen soft and not tender to touch     - MODERATE (4-7): interferes with normal activities or awakens from sleep, tender to touch     - SEVERE (8-10): excruciating pain, doubled over, unable to do any normal activities       6 7. RECURRENT SYMPTOM: "Have you ever had this type of stomach pain before?" If Yes, ask: "When was the last time?" and "What happened that time?"      Yes 8. CAUSE: "What do you think is causing the stomach pain?"     Unsure 9. RELIEVING/AGGRAVATING FACTORS: "What makes it better or worse?" (e.g., movement, antacids, bowel movement)     Bowel movement 10. OTHER SYMPTOMS: "Has there been any vomiting, diarrhea, constipation, or urine problems?"       Some constipation  Protocols used: ABDOMINAL PAIN - MALE-A-AH

## 2021-01-05 ENCOUNTER — Telehealth: Payer: Self-pay | Admitting: Family Medicine

## 2021-01-05 NOTE — Telephone Encounter (Signed)
Copied from [place] [phone]. Topic: General - Other >> Dec 30, 2020 11:59 AM Yvette Rack wrote: Reason for CRM: Kyleigh with [facility] stated pt needs prior authorization for pre-cert. Cb# [phone] [id number] [phone] >> Jan 01, 2021  9:02 AM Scherrie Gerlach wrote: Pt called to find out why his appt has been cancelled

## 2021-01-05 NOTE — Telephone Encounter (Signed)
Prior Richard [name] has been re faxed and patient has been called and informed that he will be contacted once approved.

## 2021-01-06 ENCOUNTER — Other Ambulatory Visit: Payer: Self-pay

## 2021-01-07 ENCOUNTER — Telehealth: Payer: Self-pay

## 2021-01-07 ENCOUNTER — Other Ambulatory Visit: Payer: Self-pay

## 2021-01-07 NOTE — Telephone Encounter (Signed)
Request of Coralee Pesa, [facility] Registrar, call placed to patient to address concerns he has expressed about home health services.  His sister in law answered and said she would give him the message to call this CM back.

## 2021-01-09 ENCOUNTER — Telehealth: Payer: Self-pay | Admitting: Physical Therapy

## 2021-01-09 ENCOUNTER — Ambulatory Visit: Payer: Medicaid Other | Admitting: Physical Therapy

## 2021-01-09 NOTE — Telephone Encounter (Signed)
LVM regarding missed appointment today. Noted when his next scheduled appointment date/ time is and if he cannot make it to please call before his scheduled appointment and we can cancel/ reschedule that appointment for him.  Deya Bigos PT, DPT, LAT, ATC  01/09/21  11:48 AM

## 2021-01-11 ENCOUNTER — Telehealth: Payer: Self-pay | Admitting: Family Medicine

## 2021-01-11 ENCOUNTER — Ambulatory Visit: Payer: Medicaid Other | Admitting: Physical Therapy

## 2021-01-11 NOTE — Telephone Encounter (Signed)
Richard [name] calling with Disability Office in [place] would like Medical records 07/03/20-Present Please advise [phone]

## 2021-01-12 ENCOUNTER — Ambulatory Visit: Payer: Self-pay | Admitting: *Deleted

## 2021-01-12 NOTE — Telephone Encounter (Signed)
Pt has an upcoming appointment.

## 2021-01-12 NOTE — Telephone Encounter (Signed)
Patient is calling to report he is having nasal congestion that is causing increased mucus and increased trouble with his breathing. Patient states he also has very dry oral mucosa- drinking water does not help . Patient does have appointment in June with PCP for multiple complaints- but he states he can not go outside without having a reaction- increased congestion.  Reason for Disposition . [1] Sinus congestion (pressure, fullness) AND [2] present > 10 days  Answer Assessment - Initial Assessment Questions 1. RESPIRATORY STATUS: "Describe your breathing?" (e.g., wheezing, shortness of breath, unable to speak, severe coughing)      SOB- allergy/congestion, dry mouth- can't seem to stay hydrated- mucus increase 2. ONSET: "When did this breathing problem begin?"      1 week 3. PATTERN "Does the difficult breathing come and go, or has it been constant since it started?"      Comes and goes 4. SEVERITY: "How bad is your breathing?" (e.g., mild, moderate, severe)    - MILD: No SOB at rest, mild SOB with walking, speaks normally in sentences, can lay down, no retractions, pulse < 100.    - MODERATE: SOB at rest, SOB with minimal exertion and prefers to sit, cannot lie down flat, speaks in phrases, mild retractions, audible wheezing, pulse 100-120.    - SEVERE: Very SOB at rest, speaks in single words, struggling to breathe, sitting hunched forward, retractions, pulse > 120      mild 5. RECURRENT SYMPTOM: "Have you had difficulty breathing before?" If Yes, ask: "When was the last time?" and "What happened that time?"      Patient believes he has allergies 6. CARDIAC HISTORY: "Do you have any history of heart disease?" (e.g., heart attack, angina, bypass surgery, angioplasty)      Hx heart attack 7. LUNG HISTORY: "Do you have any history of lung disease?"  (e.g., pulmonary embolus, asthma, emphysema)     Allergy history 8. CAUSE: "What do you think is causing the breathing problem?"       allergies 9. OTHER SYMPTOMS: "Do you have any other symptoms? (e.g., dizziness, runny nose, cough, chest pain, fever)     no 10. PREGNANCY: "Is there any chance you are pregnant?" "When was your last menstrual period?"       n/a 11. TRAVEL: "Have you traveled out of the country in the last month?" (e.g., travel history, exposures)       no  Answer Assessment - Initial Assessment Questions 1. ONSET: "When did the nasal discharge start?"      + 1 week 2. AMOUNT: "How much discharge is there?"      Nasal passages stopped up 3. COUGH: "Do you have a cough?" If yes, ask: "Describe the color of your sputum" (clear, white, yellow, green)     No cough 4. RESPIRATORY DISTRESS: "Describe your breathing."      Congestion makes it hard for patient to breath normally- fatigues him 5. FEVER: "Do you have a fever?" If Yes, ask: "What is your temperature, how was it measured, and when did it start?"    No fever 6. SEVERITY: "Overall, how bad are you feeling right now?" (e.g., doesn't interfere with normal activities, staying home from school/work, staying in bed)      Differs from day to day 7. OTHER SYMPTOMS: "Do you have any other symptoms?" (e.g., sore throat, earache, wheezing, vomiting)     No other symptoms 8. PREGNANCY: "Is there any chance you are pregnant?" "When was your last  menstrual period?"     n/a  Protocols used: COMMON COLD-A-AH, BREATHING DIFFICULTY-A-AH

## 2021-01-13 ENCOUNTER — Other Ambulatory Visit: Payer: Self-pay

## 2021-01-14 ENCOUNTER — Other Ambulatory Visit: Payer: Self-pay

## 2021-01-14 ENCOUNTER — Ambulatory Visit: Payer: Medicaid Other | Attending: Physician Assistant | Admitting: Physician Assistant

## 2021-01-19 NOTE — Telephone Encounter (Signed)
Richard [name] with [employer] following up on request for records.  She says they have had his case open a long time, and waiting on the record request for 07/03/2020 to present  Lovell Sheehan would like someone to send this today please.

## 2021-01-19 NOTE — Telephone Encounter (Signed)
Spoke with [place] at [phone]. We do not have a medical release form for this Patient. Per Lovell Sheehan she will fax over the request today.

## 2021-01-25 ENCOUNTER — Other Ambulatory Visit: Payer: Self-pay

## 2021-01-25 ENCOUNTER — Encounter: Payer: Self-pay | Admitting: Physical Therapy

## 2021-01-25 ENCOUNTER — Ambulatory Visit: Payer: Medicaid Other | Admitting: Physical Therapy

## 2021-01-25 DIAGNOSIS — R293 Abnormal posture: Secondary | ICD-10-CM | POA: Diagnosis not present

## 2021-01-25 DIAGNOSIS — R2689 Other abnormalities of gait and mobility: Secondary | ICD-10-CM

## 2021-01-25 DIAGNOSIS — M6281 Muscle weakness (generalized): Secondary | ICD-10-CM

## 2021-01-25 NOTE — Therapy (Addendum)
[place], [place], [address] Phone: [phone]   Fax:  [phone]  Physical Therapy Treatment / Re-certification  Patient Details  Name: Richard [name] MRN: [id number] Date of Birth: Sep 23, 1966 Referring Provider (PT): Eunice Blase, MD   Encounter Date: 01/25/2021   PT End of Session - 01/25/21 1032    Visit Number 6    Number of Visits 13    Date for PT Re-Evaluation 03/02/21    Authorization Type [employer] M[employer]    Authorization Time Period 12/14/2020 - 01/30/2021    Authorization - Visit Number 5    Authorization - Number of Visits 12    PT Start Time 0930    PT Stop Time 1013    PT Time Calculation (min) 43 min    Activity Tolerance Patient tolerated treatment well    Behavior During Therapy [facility] for tasks assessed/performed           Past Medical History:  Diagnosis Date  . Abnormal nuclear cardiac imaging test   . Acid reflux   . Acute pancreatitis 08/14/2018  . Alcohol withdrawal delirium ([place]) 08/20/2016  . 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 Alcoholic hepatitis   . Alcoholic ketoacidosis 9/47/6546  . Ascites 12/13/2019  . Aspiration pneumonia ([place]) 08/20/2016  . Chest pain of uncertain etiology   . Chronic systolic CHF (congestive heart failure) ([place])   . Cirrhosis ([place])   . Colon cancer ([place])   . DCM (dilated cardiomyopathy) ([place])   . Drug abuse ([place]) 01/07/2020  . Elevated troponin 06/27/2018  . ETOH abuse   . 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 Heme positive stool 11/13/2017  . Hepatic steatosis 08/21/2016  . High anion gap metabolic acidosis 5/0/3546  . History of colon cancer   . HTN (hypertension)   . Hypertensive urgency 06/27/2018  . Hypoglycemia 06/27/2018  . Hypokalemia 01/07/2020  . Hypomagnesemia   . Hypophosphatemia   . Lactic acidosis 08/20/2016  . Leukocytosis 01/07/2020  . Pancreatitis 08/2018  . Polyp of ascending colon   . Prolonged Q-T interval on ECG   .  Prolonged QT interval   . Protein-calorie malnutrition, severe 05/02/2018  . PUD (peptic ulcer disease)   . SBP (spontaneous bacterial peritonitis) ([place]) 01/07/2020  . Sepsis ([place]) 08/20/2016  . 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 SVT (supraventricular tachycardia) ([place])   . Thrombocytopenia ([place]) 08/21/2016    Past Surgical History:  Procedure Laterality Date  . BIOPSY  12/14/2019   Procedure: BIOPSY;  Surgeon: Mauri Pole, MD;  [facility];  Service: Endoscopy;;  . catherization  2007  . COLONOSCOPY WITH PROPOFOL N/A 12/14/2019   Procedure: COLONOSCOPY WITH PROPOFOL;  Surgeon: Mauri Pole, MD;  [facility];  Service: Endoscopy;  Laterality: N/A;  . ESOPHAGOGASTRODUODENOSCOPY (EGD) WITH PROPOFOL N/A 12/14/2019   Procedure: ESOPHAGOGASTRODUODENOSCOPY (EGD) WITH PROPOFOL;  Surgeon: Mauri Pole, MD;  [facility];  Service: Endoscopy;  Laterality: N/A;  . HERNIA REPAIR  1969   1 x at birth and at 55 years old  . LAPAROSCOPIC SIGMOID COLECTOMY  2007  . OPEN REDUCTION INTERNAL FIXATION (ORIF) HAND Right 2012   3rd  digit  . POLYPECTOMY  12/14/2019   Procedure: POLYPECTOMY;  Surgeon: Mauri Pole, MD;  [facility];  Service: Endoscopy;;    There were no vitals filed for this visit.   Subjective Assessment - 01/25/21 0930    Subjective Pt reports he has been completing his HEP but  is still having pain in his knees and feet. Pt came in without his cane. He reports he still is using his cane outside but did not bring it to therapy. Pt reports that he has pain at night that is along his legs and back that is relieved while he is moving. He is having a 10/10 pain at his worst at night time.    How long can you sit comfortably? 10 min    How long can you stand comfortably? 10 min    How long can you walk comfortably? 5-10 min    Currently in Pain? Yes    Pain Score 7     Pain Location Back    Pain Descriptors / Indicators  Aching    Pain Type Chronic pain    Multiple Pain Sites Yes    Pain Score 7    Pain Location Leg    Pain Orientation Right;Left    Pain Descriptors / Indicators Aching    Pain Type Chronic pain    Aggravating Factors  sleeping    Pain Relieving Factors standing, hot bath              [facility] PT Assessment - 01/25/21 0001      AROM   Lumbar Flexion 80   pain wiht movement   Lumbar Extension 10   pain with movement                        [facility] Adult PT Treatment/Exercise - 01/25/21 0001      Self-Care   Self-Care Posture    Posture discussed posture while sitting and standing and the importance of continuing to move thorughout the day to help decrease pain      Therapeutic Activites    Lifting --      Lumbar Exercises: Seated   Other Seated Lumbar Exercises seated thoracic rotation 2 x 10 hugging black physioball    Other Seated Lumbar Exercises pressing through bil UE with black physioball on lap 2 x 10 holding 2-3 seconds   cues for breathing     Knee/Hip Exercises: Stretches   Gastroc Stretch 2 reps;Right;Left;30 seconds    Gastroc Stretch Limitations gastroc and soleus      Knee/Hip Exercises: Aerobic   Nustep L5 x 6 min LE/UE      Knee/Hip Exercises: Standing   Hip Abduction 2 sets;10 reps;Knee straight   HHA on freemotion machine, cues for posture   Abduction Limitations 2#   cues for slowing down   Hip Extension 2 sets;10 reps;Knee straight;Stengthening;Both   with bil HHA on free motion   Extension Limitations 2#   cues for slowing down                   PT Short Term Goals - 01/04/21 1335      PT SHORT TERM GOAL #1   Title pt to IND with inital HEP    Baseline independent and consistent with HEP    Time 3    Period Weeks    Status Achieved    Target Date 12/28/20      PT SHORT TERM GOAL #2   Title pt to verbalize/ demo efficient posture and lifting mechanics to reduce and prevent back    Baseline being  mindful of posture,  began body mechanics education on4/4/22    Time 3    Period Weeks    Status Partially Met  PT Long Term Goals - 01/25/21 1005      PT LONG TERM GOAL #1   Title pt to increase trunk flexion to >/= 70 degree and extension by >/= 6 degrees with </= 4/10 max pain to promote functional trunk mobility    Baseline trunk flexion 80 degrees, trunk extension 10 degrees, pt reports 10/10 pain    Time 6    Period Weeks    Status Partially Met    Target Date 01/18/21      PT LONG TERM GOAL #2   Title increase gross hip strength to >/= 4/5 to promote standing/ walking stability and maximize safety    Baseline see flowsheet    Time 6    Period Weeks    Status On-going    Target Date 01/18/21      PT LONG TERM GOAL #3   Title pt to be able to sit ,stand and walk for >/= 30 min for in home and short community distances with </= 5/10 max pain    Baseline able to sit 10 min, stand 10 min and walk 10 min.    Time 6    Period Weeks    Status On-going    Target Date 01/18/21      PT LONG TERM GOAL #4   Title pt to be able to lay down and report max pain to </= 5/10 to demo improvement in condition and QOL    Baseline max pain 10 at night and with laying down    Time 6    Period Weeks    Status On-going    Target Date 01/18/21      PT LONG TERM GOAL #5   Title pt to be IND with all HEP given and is able to maintain and progress current LOF IND    Time 6    Period Weeks    Status On-going    Target Date 01/18/21                 Plan - 01/25/21 1022    Clinical Impression Statement Pt reports compliance with HEP. He has partially met LTG #1 and is making progress towards all other goals. Pt has increased ROM but with increased pain and reports improved strength but pain is his limiting factor. He had decreased limp following treatment. He would benefit from conitnued phsycial therapy to promote strength, and maximize his function.    PT Frequency 1x / week    PT  Duration 4 weeks    PT Treatment/Interventions ADLs/Self Care Home Management;Cryotherapy;Electrical Stimulation;Iontophoresis 4mg /ml Dexamethasone;Ultrasound;Traction;Therapeutic activities;Therapeutic exercise;Neuromuscular re-education;Manual techniques;Patient/family education;Taping;Passive range of motion;Dry needling;Moist Heat;Balance training    PT Next Visit Plan review/ update HEP - posture education, hip and core strenghteing, review gait training,  He doesn't toleate laying supine well may need wedge.    PT Home Exercise Plan [id number] - LTR, seated anterior pelvic tilt, seated march, sidelying hip abduction, seated abdominal set, standing hip abduction/ ext,           Patient will benefit from skilled therapeutic intervention in order to improve the following deficits and impairments:  Improper body mechanics,Increased muscle spasms,Obesity,Pain,Decreased activity tolerance,Decreased endurance,Decreased balance,Decreased range of motion  Visit Diagnosis: Abnormal posture  Other abnormalities of gait and mobility  Muscle weakness (generalized)     Problem List Patient Active Problem List   Diagnosis Date Noted  . H/O ETOH abuse 08/25/2020  . Hepatic cirrhosis ([place])   . Abnormal nuclear cardiac imaging test   .  Chest pain of uncertain etiology   . Alcoholic hepatitis 67/20/9198  . Hypokalemia 01/07/2020  . Leukocytosis 01/07/2020  . ETOH abuse 01/07/2020  . 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 Drug abuse ([place]) 01/07/2020  . SBP (spontaneous bacterial peritonitis) ([place]) 01/07/2020  . Tobacco abuse 01/07/2020  . 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 Prolonged QT interval   . Prolonged Q-T interval on ECG   . DCM (dilated cardiomyopathy) ([place])   . Chronic systolic CHF (congestive heart failure) ([place])   . Polyp of ascending colon   . History of colon cancer   . Ascites 12/13/2019  . SVT (supraventricular tachycardia) ([place])   . Hypomagnesemia   .  Hypophosphatemia   . High anion gap metabolic acidosis 11/24/7979  . Acute pancreatitis 08/14/2018  . Smoker 08/14/2018  . 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 HTN (hypertension) 08/14/2018  . Abdominal pain 06/27/2018  . Hypertensive urgency 06/27/2018  . Elevated troponin 06/27/2018  . Hypoglycemia 06/27/2018  . Protein-calorie malnutrition, severe 05/02/2018  . Pancreatitis 05/01/2018  . Heme positive stool 11/13/2017  . Alcoholic ketoacidosis 02/54/8628  . Hepatic steatosis 08/21/2016  . Thrombocytopenia ([place]) 08/21/2016  . Alcohol abuse 08/21/2016  . Alcohol withdrawal delirium ([place]) 08/20/2016  . Dehydration 08/20/2016  . Intractable nausea and vomiting 08/20/2016  . Lactic acidosis 08/20/2016  . Aspiration pneumonia ([place]) 08/20/2016  . Sepsis ([place]) 08/20/2016    Wyman Songster 01/25/2021, 2:10 PM  [facility] [address] [place], [place], [address] Phone: [phone]   Fax:  [phone]  Name: Richard [name] MRN: [id number] Date of Birth: 1966/07/21    Starr [place] PT, DPT, LAT, ATC  01/26/21  11:35 AM      Check all possible CPT codes: 00634- Therapeutic Exercise, [id number]- Neuro Re-education, [phone] - Gait Training, [place], [place] - Therapeutic Activities, [address] - [place] and [phone] - Physical performance training

## 2021-01-26 NOTE — Addendum Note (Signed)
Addended by: Larey Days on: 01/26/2021 11:39 AM   Modules accepted: Orders

## 2021-01-28 ENCOUNTER — Ambulatory Visit: Payer: Medicaid Other | Admitting: Physical Therapy

## 2021-01-28 ENCOUNTER — Encounter: Payer: Self-pay | Admitting: Physical Therapy

## 2021-01-28 ENCOUNTER — Other Ambulatory Visit: Payer: Self-pay

## 2021-01-28 DIAGNOSIS — R293 Abnormal posture: Secondary | ICD-10-CM | POA: Diagnosis not present

## 2021-01-28 DIAGNOSIS — R2689 Other abnormalities of gait and mobility: Secondary | ICD-10-CM

## 2021-01-28 DIAGNOSIS — M6281 Muscle weakness (generalized): Secondary | ICD-10-CM

## 2021-01-28 NOTE — Therapy (Addendum)
[place], [place], [address] Phone: [phone]   Fax:  [phone]  Physical Therapy Treatment  Patient Details  Name: Richard [name] MRN: [id number] Date of Birth: January 22, 1966 Referring Provider (PT): Eunice Blase, MD   Encounter Date: 01/28/2021   PT End of Session - 01/28/21 1152    Visit Number 7    Number of Visits 13    Date for PT Re-Evaluation 03/02/21    Authorization Type [employer] MCD    Authorization Time Period 12/14/2020 - 01/30/2021    Authorization - Visit Number 6    Authorization - Number of Visits 12    PT Start Time 1106    PT Stop Time 1145    PT Time Calculation (min) 39 min    Activity Tolerance Patient tolerated treatment well    Behavior During Therapy [facility] for tasks assessed/performed           Past Medical History:  Diagnosis Date  . Abnormal nuclear cardiac imaging test   . Acid reflux   . Acute pancreatitis 08/14/2018  . Alcohol withdrawal delirium ([place]) 08/20/2016  . 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 Alcoholic hepatitis   . Alcoholic ketoacidosis 7/42/5956  . Ascites 12/13/2019  . Aspiration pneumonia ([place]) 08/20/2016  . Chest pain of uncertain etiology   . Chronic systolic CHF (congestive heart failure) ([place])   . Cirrhosis ([place])   . Colon cancer ([place])   . DCM (dilated cardiomyopathy) ([place])   . Drug abuse ([place]) 01/07/2020  . Elevated troponin 06/27/2018  . ETOH abuse   . 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 Heme positive stool 11/13/2017  . Hepatic steatosis 08/21/2016  . High anion gap metabolic acidosis 12/09/7562  . History of colon cancer   . HTN (hypertension)   . Hypertensive urgency 06/27/2018  . Hypoglycemia 06/27/2018  . Hypokalemia 01/07/2020  . Hypomagnesemia   . Hypophosphatemia   . Lactic acidosis 08/20/2016  . Leukocytosis 01/07/2020  . Pancreatitis 08/2018  . Polyp of ascending colon   . Prolonged Q-T interval on ECG   . Prolonged QT  interval   . Protein-calorie malnutrition, severe 05/02/2018  . PUD (peptic ulcer disease)   . SBP (spontaneous bacterial peritonitis) ([place]) 01/07/2020  . Sepsis ([place]) 08/20/2016  . 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 SVT (supraventricular tachycardia) ([place])   . Thrombocytopenia ([place]) 08/21/2016    Past Surgical History:  Procedure Laterality Date  . BIOPSY  12/14/2019   Procedure: BIOPSY;  Surgeon: Mauri Pole, MD;  [facility];  Service: Endoscopy;;  . catherization  2007  . COLONOSCOPY WITH PROPOFOL N/A 12/14/2019   Procedure: COLONOSCOPY WITH PROPOFOL;  Surgeon: Mauri Pole, MD;  [facility];  Service: Endoscopy;  Laterality: N/A;  . ESOPHAGOGASTRODUODENOSCOPY (EGD) WITH PROPOFOL N/A 12/14/2019   Procedure: ESOPHAGOGASTRODUODENOSCOPY (EGD) WITH PROPOFOL;  Surgeon: Mauri Pole, MD;  [facility];  Service: Endoscopy;  Laterality: N/A;  . HERNIA REPAIR  1969   1 x at birth and at 55 years old  . LAPAROSCOPIC SIGMOID COLECTOMY  2007  . OPEN REDUCTION INTERNAL FIXATION (ORIF) HAND Right 2012   3rd  digit  . POLYPECTOMY  12/14/2019   Procedure: POLYPECTOMY;  Surgeon: Mauri Pole, MD;  [facility];  Service: Endoscopy;;    There were no vitals filed for this visit.   Subjective Assessment - 01/28/21 1108    Subjective Pt arrives with no cane and reports he has been leaving  his cane at home. He is having leg and back pain. Pt reports pain is spreading up his legs.    Currently in Pain? Yes    Pain Score 7     Pain Location Leg    Pain Orientation Left;Right    Multiple Pain Sites Yes    Pain Score 7    Pain Location Back                             [facility] Adult PT Treatment/Exercise - 01/28/21 0001      Lumbar Exercises: Seated   Other Seated Lumbar Exercises seated thoracic rotation 3 x 10 hugging black physioball   cues for going slow   Other Seated Lumbar Exercises pressing through bil  UE with black physioball on lap 2 x 10 holding 2-3 seconds   cues for breathing     Knee/Hip Exercises: Stretches   Gastroc Stretch 2 reps;Right;Left;30 seconds    Gastroc Stretch Limitations gastroc      Knee/Hip Exercises: Aerobic   Nustep L5 x 6 min LE/UE      Knee/Hip Exercises: Standing   Heel Raises 15 reps    Heel Raises Limitations heel and toe    Hip Abduction 10 reps;Knee straight;3 sets   HHA on freemotion machine, cues for posture and slowing down   Abduction Limitations 2#   cues for slowing down   Hip Extension 2 sets;10 reps;Knee straight;Stengthening;Both   with bil HHA on free motion, cues for slowing down   Extension Limitations 2#   cues for slowing down   Other Standing Knee Exercises hip hinge with dowel 10x sitting, 10 times standing, cues for keeping back flat and pushing hips back      Knee/Hip Exercises: Seated   Marching Strengthening;Both;2 sets;10 reps    Sit to Sand 10 reps;2 sets   without UE, from mat table                   PT Short Term Goals - 01/28/21 1249      PT SHORT TERM GOAL #1   Title pt to IND with inital HEP    Baseline independent and consistent with HEP    Time 3    Period Weeks    Status Achieved    Target Date 12/28/20      PT SHORT TERM GOAL #2   Title pt to verbalize/ demo efficient posture and lifting mechanics to reduce and prevent back    Baseline being  mindful of posture, began body mechanics education on4/4/22, continues to need cues for bending 01/28/21    Time 3    Period Weeks    Status Partially Met    Target Date 12/28/20             PT Long Term Goals - 01/25/21 1005      PT LONG TERM GOAL #1   Title pt to increase trunk flexion to >/= 70 degree and extension by >/= 6 degrees with </= 4/10 max pain to promote functional trunk mobility    Baseline trunk flexion 80 degrees, trunk extension 10 degrees, pt reports 10/10 pain    Time 6    Period Weeks    Status Partially Met    Target Date 01/18/21       PT LONG TERM GOAL #2   Title increase gross hip strength to >/= 4/5 to promote standing/ walking stability and maximize safety  Baseline see flowsheet    Time 6    Period Weeks    Status On-going    Target Date 01/18/21      PT LONG TERM GOAL #3   Title pt to be able to sit ,stand and walk for >/= 30 min for in home and short community distances with </= 5/10 max pain    Baseline able to sit 10 min, stand 10 min and walk 10 min.    Time 6    Period Weeks    Status On-going    Target Date 01/18/21      PT LONG TERM GOAL #4   Title pt to be able to lay down and report max pain to </= 5/10 to demo improvement in condition and QOL    Baseline max pain 10 at night and with laying down    Time 6    Period Weeks    Status On-going    Target Date 01/18/21      PT LONG TERM GOAL #5   Title pt to be IND with all HEP given and is able to maintain and progress current LOF IND    Time 6    Period Weeks    Status On-going    Target Date 01/18/21                 Plan - 01/28/21 1150    Clinical Impression Statement Pt reports compliance with HEP. Pt still reports pain in his legs and back that is spreading but continues to do well with exercises and continues to get stronger. Pt arrived at clinic with no cane and reports he has been leaving his cane at home. Pt began lifting mechanics with dowel in both sitting and standing.    PT Next Visit Plan review/ update HEP - posture education, hip and core strenghteing, review gait training,  He doesn't toleate laying supine well may need wedge. continue hip hinge/ lifting mechanics    PT Home Exercise Plan [id number] - LTR, seated anterior pelvic tilt, seated march, sidelying hip abduction, seated abdominal set, standing hip abduction/ ext,           Patient will benefit from skilled therapeutic intervention in order to improve the following deficits and impairments:  Improper body mechanics,Increased muscle  spasms,Obesity,Pain,Decreased activity tolerance,Decreased endurance,Decreased balance,Decreased range of motion  Visit Diagnosis: Other abnormalities of gait and mobility  Abnormal posture  Muscle weakness (generalized)     Problem List Patient Active Problem List   Diagnosis Date Noted  . H/O ETOH abuse 08/25/2020  . Hepatic cirrhosis ([place])   . Abnormal nuclear cardiac imaging test   . Chest pain of uncertain etiology   . Alcoholic hepatitis 32/09/2481  . Hypokalemia 01/07/2020  . Leukocytosis 01/07/2020  . ETOH abuse 01/07/2020  . 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 Drug abuse ([place]) 01/07/2020  . SBP (spontaneous bacterial peritonitis) ([place]) 01/07/2020  . Tobacco abuse 01/07/2020  . 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 Prolonged QT interval   . Prolonged Q-T interval on ECG   . DCM (dilated cardiomyopathy) ([place])   . Chronic systolic CHF (congestive heart failure) ([place])   . Polyp of ascending colon   . History of colon cancer   . Ascites 12/13/2019  . SVT (supraventricular tachycardia) ([place])   . Hypomagnesemia   . Hypophosphatemia   . High anion gap metabolic acidosis 50/12/7046  . Acute pancreatitis 08/14/2018  . Smoker 08/14/2018  . 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 HTN (hypertension)  08/14/2018  . Abdominal pain 06/27/2018  . Hypertensive urgency 06/27/2018  . Elevated troponin 06/27/2018  . Hypoglycemia 06/27/2018  . Protein-calorie malnutrition, severe 05/02/2018  . Pancreatitis 05/01/2018  . Heme positive stool 11/13/2017  . Alcoholic ketoacidosis 51/07/2110  . Hepatic steatosis 08/21/2016  . Thrombocytopenia ([place]) 08/21/2016  . Alcohol abuse 08/21/2016  . Alcohol withdrawal delirium ([place]) 08/20/2016  . Dehydration 08/20/2016  . Intractable nausea and vomiting 08/20/2016  . Lactic acidosis 08/20/2016  . Aspiration pneumonia ([place]) 08/20/2016  . Sepsis ([place]) 08/20/2016    Wyman Songster 01/28/2021, 12:50 PM  [facility] [address] [place], [place], [address] Phone: [phone]   Fax:  [phone]  Name: Richard [name] MRN: [id number] Date of Birth: April 21, 1966

## 2021-01-29 ENCOUNTER — Other Ambulatory Visit: Payer: Self-pay | Admitting: Family Medicine

## 2021-01-29 ENCOUNTER — Other Ambulatory Visit: Payer: Self-pay

## 2021-01-29 DIAGNOSIS — K7290 Hepatic failure, unspecified without coma: Secondary | ICD-10-CM

## 2021-01-29 DIAGNOSIS — K7682 Hepatic encephalopathy: Secondary | ICD-10-CM

## 2021-01-29 DIAGNOSIS — G629 Polyneuropathy, unspecified: Secondary | ICD-10-CM

## 2021-01-29 MED ORDER — CYCLOBENZAPRINE HCL 10 MG PO TABS
ORAL_TABLET | Freq: Every day | ORAL | 0 refills | Status: DC
  Filled 2021-01-29: qty 30, 30d supply, fill #0

## 2021-01-29 MED FILL — Hyoscyamine Sulfate SL Tab 0.125 MG: SUBLINGUAL | 10 days supply | Qty: 30 | Fill #1 | Status: AC

## 2021-01-29 MED FILL — Diclofenac Sodium Gel 1% (1.16% Diethylamine Equiv): CUTANEOUS | 13 days supply | Qty: 100 | Fill #0 | Status: AC

## 2021-01-29 NOTE — Telephone Encounter (Signed)
  Notes to clinic: Gabapentin filled by a different provider Patient also requesting Hydrocodone but not on current list  Review for refills   Requested Prescriptions  Pending Prescriptions Disp Refills   gabapentin (NEURONTIN) 300 MG capsule 120 capsule 3    Sig: TAKE 2 CAPSULES (600 MG TOTAL) BY MOUTH 2 (TWO) TIMES DAILY.      Neurology: Anticonvulsants - gabapentin Passed - 01/29/2021  9:45 AM      Passed - Valid encounter within last 12 months    Recent Outpatient Visits           1 month ago Abnormal CT of the abdomen   [place], Charlane Ferretti, MD   5 months ago DCM (dilated cardiomyopathy) [facility])   [place], Charlane Ferretti, MD   6 months ago Chronic right-sided low back pain with bilateral sciatica   [place] F[place], [place], MD   7 months ago No-show for appointment   [place], [place], NP   9 months ago Muscle cramps   [place], [place], NP       Future Appointments             In 1 month Charlott Rakes, MD [place]               cyclobenzaprine (FLEXERIL) 10 MG tablet 30 tablet 0    Sig: TAKE 1 TABLET (10 MG TOTAL) BY MOUTH AT BEDTIME.      Not Delegated - Analgesics:  Muscle Relaxants Failed - 01/29/2021  9:45 AM      Failed - This refill cannot be delegated      Passed - Valid encounter within last 6 months    Recent Outpatient Visits           1 month ago Abnormal CT of the abdomen   [place], Charlane Ferretti, MD   5 months ago DCM (dilated cardiomyopathy) [facility])   [place], Charlane Ferretti, MD   6 months ago Chronic right-sided low back pain with bilateral sciatica   [place], MD   7 months ago No-show for appointment   [place], [place], NP   9 months ago Muscle cramps   [place], [place], NP       Future Appointments             In 1 month Charlott Rakes, MD [place]

## 2021-01-29 NOTE — Telephone Encounter (Signed)
Medication Refill - Medication: Hydrocodone, gabapentin, cyclobenzaprine, Lactulose (pt is requesting to have this in pill form), diclofenac sodium tropical gel, pantoprazole    Has the patient contacted their pharmacy? Yes.  Pt states that he has to contact the office for his refills.  (Agent: If no, request that the patient contact the pharmacy for the refill.) (Agent: If yes, when and what did the pharmacy advise?)  Preferred Pharmacy (with phone number or street name):  [facility] [address] [place] [address]  Phone: [phone] Fax: [phone]  Hours: M-F 8:30a-5:30p     Agent: Please be advised that RX refills may take up to 3 business days. We ask that you follow-up with your pharmacy.

## 2021-01-29 NOTE — Telephone Encounter (Signed)
Requested medication (s) are due for refill today: yes  Requested medication (s) are on the active medication list: yes  Last refill:  01/04/21  Future visit scheduled: yes  Notes to clinic:  not delegated    Requested Prescriptions  Pending Prescriptions Disp Refills   cyclobenzaprine (FLEXERIL) 10 MG tablet 30 tablet 0    Sig: TAKE 1 TABLET (10 MG TOTAL) BY MOUTH AT BEDTIME.      Not Delegated - Analgesics:  Muscle Relaxants Failed - 01/29/2021  2:07 PM      Failed - This refill cannot be delegated      Passed - Valid encounter within last 6 months    Recent Outpatient Visits           1 month ago Abnormal CT of the abdomen   Alexander, Charlane Ferretti, MD   5 months ago DCM (dilated cardiomyopathy) [facility])   [place], Charlane Ferretti, MD   6 months ago Chronic right-sided low back pain with bilateral sciatica   [place], [place], MD   7 months ago No-show for appointment   [place], [place], NP   9 months ago Muscle cramps   [place], [place], NP       Future Appointments             In 1 month Charlott Rakes, MD [place]              Refused Prescriptions Disp Refills   pantoprazole (PROTONIX) 40 MG tablet 90 tablet 0    Sig: TAKE 1 TABLET (40 MG TOTAL) BY MOUTH IN THE MORNING.      Gastroenterology: Proton Pump Inhibitors Passed - 01/29/2021  2:07 PM      Passed - Valid encounter within last 12 months    Recent Outpatient Visits           1 month ago Abnormal CT of the abdomen   [place], Charlane Ferretti, MD   5 months ago DCM (dilated cardiomyopathy) [facility])   [place], Charlane Ferretti, MD   6 months ago Chronic right-sided low back pain with bilateral sciatica   [place], MD   7 months ago No-show for appointment   [place], [place], NP   9 months ago Muscle cramps   [place], [place], NP       Future Appointments             In 1 month Charlott Rakes, MD [place]

## 2021-01-30 MED ORDER — GABAPENTIN 300 MG PO CAPS
ORAL_CAPSULE | Freq: Two times a day (BID) | ORAL | 3 refills | Status: DC
  Filled 2021-01-30 – 2021-07-19 (×5): qty 120, 30d supply, fill #0

## 2021-02-01 ENCOUNTER — Other Ambulatory Visit: Payer: Self-pay

## 2021-02-03 ENCOUNTER — Ambulatory Visit: Payer: Medicaid Other | Admitting: Physical Therapy

## 2021-02-08 ENCOUNTER — Other Ambulatory Visit: Payer: Self-pay

## 2021-02-08 ENCOUNTER — Ambulatory Visit: Payer: Medicaid Other | Admitting: Physical Therapy

## 2021-02-11 ENCOUNTER — Other Ambulatory Visit: Payer: Self-pay

## 2021-02-11 ENCOUNTER — Ambulatory Visit: Payer: Self-pay

## 2021-02-11 NOTE — Telephone Encounter (Signed)
Pt. Reports he is having dry mouth x several months. Not sure if it is allergies or medication causing this.Has tried Claritin- has not helped. Has appointment 03/03/21. Would like to be seen sooner if possible. Would like to know what else to try for allergies.Please advise. Answer Assessment - Initial Assessment Questions 1. SYMPTOM: "What's the main symptom you're concerned about?" (e.g., chapped lips, dry mouth, lump, sores)     Dry mouth 2. ONSET: "When did the  Dry mouth stomach  start?"     Several months 3. PAIN: "Is there any pain?" If Yes, ask: "How bad is it?" (Scale: 1-10; mild, moderate, severe)   - MILD (1-3):  doesn't interfere with eating or normal activities   - MODERATE (4-7): interferes with eating some solids and normal activities   - SEVERE (8-10):  excruciating pain, interferes with most normal activities   - SEVERE DYSPHAGIA: can't swallow liquids, drooling     No 4. CAUSE: "What do you think is causing the symptoms?"     Maybe his medicine 5. OTHER SYMPTOMS: "Do you have any other symptoms?" (e.g., fever, sore throat, toothache, swelling)     No 6. PREGNANCY: "Is there any chance you are pregnant?" "When was your last menstrual period?"     n/a  Protocols used: MOUTH [facility]

## 2021-02-12 ENCOUNTER — Telehealth: Payer: Self-pay | Admitting: Family Medicine

## 2021-02-12 NOTE — Telephone Encounter (Signed)
Copied from [place] [id number]. Topic: General - Other >> Feb 12, 2021  1:23 PM Alanda Slim E wrote: Reason for CRM: Radiology called and stated that pt has [employer] and the MRI needs  precert/please advise >> Feb 12, 2021  1:27 PM Alanda Slim E wrote: Sorry / needed this to go to [facility] for [employer]

## 2021-02-12 NOTE — Telephone Encounter (Signed)
Authorization number is in appointment note.

## 2021-02-16 ENCOUNTER — Ambulatory Visit: Payer: Medicaid Other | Attending: Family Medicine | Admitting: Physical Therapy

## 2021-02-16 ENCOUNTER — Ambulatory Visit: Payer: Self-pay | Admitting: *Deleted

## 2021-02-16 ENCOUNTER — Other Ambulatory Visit: Payer: Self-pay

## 2021-02-16 DIAGNOSIS — M6281 Muscle weakness (generalized): Secondary | ICD-10-CM | POA: Insufficient documentation

## 2021-02-16 DIAGNOSIS — R293 Abnormal posture: Secondary | ICD-10-CM | POA: Insufficient documentation

## 2021-02-16 DIAGNOSIS — R2689 Other abnormalities of gait and mobility: Secondary | ICD-10-CM | POA: Insufficient documentation

## 2021-02-16 NOTE — Therapy (Signed)
Richard [name], [place], [address] Phone: [phone]   Fax:  [phone]  Physical Therapy Treatment / Discharge  Patient Details  Name: Richard [name] MRN: [id number] Date of Birth: 15-Apr-1966 Referring Provider (PT): Eunice Blase, MD   Encounter Date: 02/16/2021   PT End of Session - 02/16/21 1149    Visit Number 8    Number of Visits 13    Date for PT Re-Evaluation 03/02/21    Authorization Type [employer] MCD    Authorization Time Period 02/03/21 - 03/02/2021    PT Start Time 1150    Activity Tolerance Patient tolerated treatment well    Behavior During Therapy [facility] for tasks assessed/performed           Past Medical History:  Diagnosis Date  . Abnormal nuclear cardiac imaging test   . Acid reflux   . Acute pancreatitis 08/14/2018  . Alcohol withdrawal delirium ([place]) 08/20/2016  . 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 Alcoholic hepatitis   . Alcoholic ketoacidosis 8/75/6433  . Ascites 12/13/2019  . Aspiration pneumonia ([place]) 08/20/2016  . Chest pain of uncertain etiology   . Chronic systolic CHF (congestive heart failure) ([place])   . Cirrhosis ([place])   . Colon cancer ([place])   . DCM (dilated cardiomyopathy) ([place])   . Drug abuse ([place]) 01/07/2020  . Elevated troponin 06/27/2018  . ETOH abuse   . 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 Heme positive stool 11/13/2017  . Hepatic steatosis 08/21/2016  . High anion gap metabolic acidosis 11/11/5186  . History of colon cancer   . HTN (hypertension)   . Hypertensive urgency 06/27/2018  . Hypoglycemia 06/27/2018  . Hypokalemia 01/07/2020  . Hypomagnesemia   . Hypophosphatemia   . Lactic acidosis 08/20/2016  . Leukocytosis 01/07/2020  . Pancreatitis 08/2018  . Polyp of ascending colon   . Prolonged Q-T interval on ECG   . Prolonged QT interval   . Protein-calorie malnutrition, severe 05/02/2018  . PUD (peptic ulcer disease)   . SBP (spontaneous bacterial  peritonitis) ([place]) 01/07/2020  . Sepsis ([place]) 08/20/2016  . 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 SVT (supraventricular tachycardia) ([place])   . Thrombocytopenia ([place]) 08/21/2016    Past Surgical History:  Procedure Laterality Date  . BIOPSY  12/14/2019   Procedure: BIOPSY;  Surgeon: Mauri Pole, MD;  Location: [facility] END[facility]COPY;  Service: Endoscopy;;  . catherization  2007  . COLONOSCOPY WITH PROPOFOL N/A 12/14/2019   Procedure: COLONOSCOPY WITH PROPOFOL;  Surgeon: Mauri Pole, MD;  Location: [facility];  Service: Endoscopy;  Laterality: N/A;  . ESOPHAGOGASTRODUODENOSCOPY (EGD) WITH PROPOFOL N/A 12/14/2019   Procedure: ESOPHAGOGASTRODUODENOSCOPY (EGD) WITH PROPOFOL;  Surgeon: Mauri Pole, MD;  Location: [facility];  Service: Endoscopy;  Laterality: N/A;  . HERNIA REPAIR  1969   1 x at birth and at 55 years old  . LAPAROSCOPIC SIGMOID COLECTOMY  2007  . OPEN REDUCTION INTERNAL FIXATION (ORIF) HAND Right 2012   3rd  digit  . POLYPECTOMY  12/14/2019   Procedure: POLYPECTOMY;  Surgeon: Mauri Pole, MD;  Location: [facility];  Service: Endoscopy;;    There were no vitals filed for this visit.                                PT Short Term Goals - 01/28/21 1249      PT SHORT TERM GOAL #1   Title pt to  IND with inital HEP    Baseline independent and consistent with HEP    Time 3    Period Weeks    Status Achieved    Target Date 12/28/20      PT SHORT TERM GOAL #2   Title pt to verbalize/ demo efficient posture and lifting mechanics to reduce and prevent back    Baseline being  mindful of posture, began body mechanics education on4/4/22, continues to need cues for bending 01/28/21    Time 3    Period Weeks    Status Partially Met    Target Date 12/28/20             PT Long Term Goals - 01/25/21 1005      PT LONG TERM GOAL #1   Title pt to increase trunk flexion to >/= 70 degree and extension by >/= 6 degrees with </= 4/10  max pain to promote functional trunk mobility    Baseline trunk flexion 80 degrees, trunk extension 10 degrees, pt reports 10/10 pain    Time 6    Period Weeks    Status Partially Met    Target Date 01/18/21      PT LONG TERM GOAL #2   Title increase gross hip strength to >/= 4/5 to promote standing/ walking stability and maximize safety    Baseline see flowsheet    Time 6    Period Weeks    Status On-going    Target Date 01/18/21      PT LONG TERM GOAL #3   Title pt to be able to sit ,stand and walk for >/= 30 min for in home and short community distances with </= 5/10 max pain    Baseline able to sit 10 min, stand 10 min and walk 10 min.    Time 6    Period Weeks    Status On-going    Target Date 01/18/21      PT LONG TERM GOAL #4   Title pt to be able to lay down and report max pain to </= 5/10 to demo improvement in condition and QOL    Baseline max pain 10 at night and with laying down    Time 6    Period Weeks    Status On-going    Target Date 01/18/21      PT LONG TERM GOAL #5   Title pt to be IND with all HEP given and is able to maintain and progress current LOF IND    Time 6    Period Weeks    Status On-going    Target Date 01/18/21                 Plan - 02/16/21 1206    Clinical Impression Statement pt arrived to his therapy session noting he has continued GI issues which is limiting his ability attend PT. I discussed with him if this is impacting his attendance and overall benefit then it may not be the right time for PT. He agreed that he would rather get his GI condition taken care of before resuming physical therapy. The plan is to discharge at this point in time and he is going to reach out to his MD for a new referral when he is ready for PT.           Patient will benefit from skilled therapeutic intervention in order to improve the following deficits and impairments:  Improper body mechanics,Increased muscle spasms,Obesity,Pain,Decreased  activity tolerance,Decreased  endurance,Decreased balance,Decreased range of motion  Visit Diagnosis: Other abnormalities of gait and mobility  Abnormal posture  Muscle weakness (generalized)     Problem List Patient Active Problem List   Diagnosis Date Noted  . H/O ETOH abuse 08/25/2020  . Hepatic cirrhosis ([place])   . Abnormal nuclear cardiac imaging test   . Chest pain of uncertain etiology   . Alcoholic hepatitis 14/70/9295  . Hypokalemia 01/07/2020  . Leukocytosis 01/07/2020  . ETOH abuse 01/07/2020  . 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 Drug abuse ([place]) 01/07/2020  . SBP (spontaneous bacterial peritonitis) ([place]) 01/07/2020  . Tobacco abuse 01/07/2020  . 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 Prolonged QT interval   . Prolonged Q-T interval on ECG   . DCM (dilated cardiomyopathy) ([place])   . Chronic systolic CHF (congestive heart failure) ([place])   . Polyp of ascending colon   . History of colon cancer   . Ascites 12/13/2019  . SVT (supraventricular tachycardia) ([place])   . Hypomagnesemia   . Hypophosphatemia   . High anion gap metabolic acidosis 74/73/4037  . Acute pancreatitis 08/14/2018  . Smoker 08/14/2018  . 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 HTN (hypertension) 08/14/2018  . Abdominal pain 06/27/2018  . Hypertensive urgency 06/27/2018  . Elevated troponin 06/27/2018  . Hypoglycemia 06/27/2018  . Protein-calorie malnutrition, severe 05/02/2018  . Pancreatitis 05/01/2018  . Heme positive stool 11/13/2017  . Alcoholic ketoacidosis 09/64/3838  . Hepatic steatosis 08/21/2016  . Thrombocytopenia ([place]) 08/21/2016  . Alcohol abuse 08/21/2016  . Alcohol withdrawal delirium ([place]) 08/20/2016  . Dehydration 08/20/2016  . Intractable nausea and vomiting 08/20/2016  . Lactic acidosis 08/20/2016  . Aspiration pneumonia ([place]) 08/20/2016  . Sepsis ([place]) 08/20/2016    [place] 02/16/2021, 12:11 PM  [facility] [address] [place], [place], [address] Phone: [phone]   Fax:  [phone]  Name: Richard [name] MRN: [id number] Date of Birth: 05/30/1966       PHYSICAL THERAPY DISCHARGE SUMMARY  Visits from Start of Care: 8  Current functional level related to goals / functional outcomes: See goals   Remaining deficits: Current status unknown based on attendance and last session he opted not to be seen and discharge from PT.    Education / Equipment: HEP,theraband  Plan: Patient agrees to discharge.  Patient goals were not met. Patient is being discharged due to the patient's request.  ?????         Dia Jefferys PT, DPT, LAT, ATC  02/16/21  12:12 PM

## 2021-02-16 NOTE — Telephone Encounter (Signed)
Pt's nephew calling, pt present, spoke with pt. Multiple issues. Reports facial pain "Only when I go outside." States occasional cough, dry. Also reports "Teeth hurt, have a hole in my tooth." Reports severe abdominal pain, bilateral leg pain. "I hurt bad from top of stomach to toes." Nephew states he has to assist pt off couch, chair. Reports vomiting last 2 days. Pt handed phone to nephew during triage as pt was vomiting.  Advised ED. States will follow disposition. Assured NT would route to practice for PCPs review. Nephew requested CB from Dr. Margarita Rana, nephew not on DPR.   CB# if needed. [phone]  Reason for Disposition . Patient sounds very sick or weak to the triager  Answer Assessment - Initial Assessment Questions 1. LOCATION: "Where does it hurt?"      Teeth, entire face 2. ONSET: "When did the sinus pain start?"  (e.g., hours, days)      Last year 3. SEVERITY: "How bad is the pain?"   (Scale 1-10; mild, moderate or severe)   - MILD (1-3): doesn't interfere with normal activities    - MODERATE (4-7): interferes with normal activities (e.g., work or school) or awakens from sleep   - SEVERE (8-10): excruciating pain and patient unable to do any normal activities        "Only when outside." 4. RECURRENT SYMPTOM: "Have you ever had sinus problems before?" If Yes, ask: "When was the last time?" and "What happened that time?"      *No Answer* 5. NASAL CONGESTION: "Is the nose blocked?" If Yes, ask: "Can you open it or must you breathe through your mouth?"     *No Answer* 6. NASAL DISCHARGE: "Do you have discharge from your nose?" If so ask, "What color?"     *No Answer* 7. FEVER: "Do you have a fever?" If Yes, ask: "What is it, how was it measured, and when did it start?"      no 8. OTHER SYMPTOMS: "Do you have any other symptoms?" (e.g., sore throat, cough, earache, difficulty breathing)     Cough at times.  Leg pain, both legs. Top of stomach to toes. Back pain.Stomach pain. Hurt  all over with movement.  Vomiting. Last 2 days. Severe pain in stomach,  Protocols used: SINUS PAIN OR CONGESTION-A-AH

## 2021-02-17 ENCOUNTER — Telehealth: Payer: Self-pay | Admitting: Family Medicine

## 2021-02-17 NOTE — Telephone Encounter (Signed)
Called pt made aware of MD instructions. Verbalized understanding

## 2021-02-17 NOTE — Telephone Encounter (Signed)
Copied from [place] [id number]. Topic: General - Call Back - No Documentation >> Feb 11, 2021 11:20 AM Erick Blinks wrote: Reason for CRM: Pt has questions about his disability status, he is unhappy that time has been passed and he is unaware of the current status. He thinks that he is having reactions to his current medications but he is unsure.   Best contact: [phone]   Patient concerned about a medication reaction, was recently transferred to nurse triage, although for a different reason. Do not see any notes about disability paperwork for patient, do you have a status?

## 2021-02-17 NOTE — Telephone Encounter (Signed)
Please advise him to follow recommendation of triage nurse. He can also be directed to the mobile unit for a earlier visit.

## 2021-02-18 ENCOUNTER — Telehealth: Payer: Self-pay | Admitting: Family Medicine

## 2021-02-18 ENCOUNTER — Ambulatory Visit: Payer: Medicaid Other | Admitting: Physical Therapy

## 2021-02-18 ENCOUNTER — Other Ambulatory Visit: Payer: Self-pay

## 2021-02-18 NOTE — Telephone Encounter (Signed)
Pt is calling to report that his feet and leg hurt so bad. He can barely walk.  And he is in extreme pain. He has an appt on 03/03/21. Pt also expressed concerns that social security is needing information from his primary care doctor.

## 2021-02-18 NOTE — Telephone Encounter (Signed)
Call placed to patient and VM was left informing patient to return phone call.  Spoke to front office staff and they informed me that medical records were sent to Disability on 01/19/2021.

## 2021-02-19 ENCOUNTER — Other Ambulatory Visit: Payer: Self-pay

## 2021-02-19 ENCOUNTER — Ambulatory Visit (HOSPITAL_COMMUNITY)
Admission: RE | Admit: 2021-02-19 | Discharge: 2021-02-19 | Disposition: A | Discharge status: Home / Self Care | Payer: Medicaid Other | Source: Ambulatory Visit | Attending: Family Medicine | Admitting: Family Medicine

## 2021-02-19 DIAGNOSIS — R935 Abnormal findings on diagnostic imaging of other abdominal regions, including retroperitoneum: Secondary | ICD-10-CM | POA: Diagnosis not present

## 2021-02-19 DIAGNOSIS — K7030 Alcoholic cirrhosis of liver without ascites: Secondary | ICD-10-CM | POA: Diagnosis present

## 2021-02-19 MED ORDER — GADOBUTROL 1 MMOL/ML IV SOLN
5.5000 mL | Freq: Once | INTRAVENOUS | Status: AC | PRN
  Administered 2021-02-19: 5.5 mL via INTRAVENOUS

## 2021-02-19 NOTE — Telephone Encounter (Signed)
Pt was called and informed to keep MRI appointment for this afternoon, and all other matters will be discussed at upcoming office visit.

## 2021-02-24 ENCOUNTER — Telehealth: Payer: Self-pay

## 2021-02-24 ENCOUNTER — Telehealth: Payer: Self-pay | Admitting: Gastroenterology

## 2021-02-24 NOTE — Telephone Encounter (Signed)
Patient name and DOB has been verified Patient was informed of lab results. Patient had no questions.  

## 2021-02-24 NOTE — Telephone Encounter (Signed)
6/9 appt with Dr Henrene Pastor has been made to discuss cirrhosis follow up and abnormal MRI. The pt has been advised to call his PCP if he does not feel like he can wait until the appt with Dr Henrene Pastor.  He was also advised he can call to see if we have had any cancellations. The pt has been advised of the information and verbalized understanding.

## 2021-02-24 NOTE — Telephone Encounter (Signed)
Pt called stating that he had Mri with his PC that showed cirrhosis and fluid in his stomach. I made an appt on 6/16 with [name] but pt states that he feels very uncomfortable and would like something sooner. Pls call him.

## 2021-02-24 NOTE — Telephone Encounter (Signed)
-----   Message from Charlott Rakes, MD sent at 02/22/2021  5:23 PM EDT ----- Please inform him that his MRI shows findings consistent with cirrhosis, hepatitis and fluid in his abdomen but does not show any evidence of cancer.  Please advise him to follow-up with his gastroenterologist regarding management of his cirrhosis.

## 2021-02-25 ENCOUNTER — Ambulatory Visit: Payer: Self-pay | Admitting: *Deleted

## 2021-02-25 NOTE — Telephone Encounter (Signed)
Pt reports leg pain for 1 1/2 years. States fell in shower Friday. Pt evasive historian, hyper verbal, conflicting reports. "Nephew" in background  talking over pt. States frustrated "I can't get pain pills, I can't get relief from this pain. Everyone looks at my stomach and no one looks down." Was able to ascertain  he was having rib pain after fall, "Hard to breathe deep." Advised ED , pt then states "I've had that pain since I was working. Advised of Dr. Benard Rink last note to keep appt 03/03/21 and all issues would be addressed. Pt verbalizes understanding.  Reason for Disposition . [1] SEVERE pain (e.g., excruciating, unable to do any normal activities) AND [2] not improved after 2 hours of pain medicine  Protocols used: LEG PAIN-A-AH

## 2021-02-26 NOTE — Telephone Encounter (Signed)
I will address at upcoming visit in 4 days.  He does have Cymbalta, Flexeril, diclofenac, gabapentin for pain and can use these until then.

## 2021-02-26 NOTE — Telephone Encounter (Signed)
Called pt made aware of MD message.

## 2021-03-03 ENCOUNTER — Ambulatory Visit: Payer: Medicaid Other | Attending: Family Medicine | Admitting: Family Medicine

## 2021-03-03 ENCOUNTER — Other Ambulatory Visit: Payer: Self-pay

## 2021-03-03 ENCOUNTER — Encounter: Payer: Self-pay | Admitting: Family Medicine

## 2021-03-03 VITALS — BP 145/82 | HR 74 | Ht 70.0 in | Wt 119.6 lb

## 2021-03-03 DIAGNOSIS — I426 Alcoholic cardiomyopathy: Secondary | ICD-10-CM | POA: Diagnosis not present

## 2021-03-03 DIAGNOSIS — K7011 Alcoholic hepatitis with ascites: Secondary | ICD-10-CM | POA: Diagnosis not present

## 2021-03-03 DIAGNOSIS — F17210 Nicotine dependence, cigarettes, uncomplicated: Secondary | ICD-10-CM

## 2021-03-03 DIAGNOSIS — M21371 Foot drop, right foot: Secondary | ICD-10-CM

## 2021-03-03 DIAGNOSIS — I5023 Acute on chronic systolic (congestive) heart failure: Secondary | ICD-10-CM

## 2021-03-03 DIAGNOSIS — M545 Low back pain, unspecified: Secondary | ICD-10-CM

## 2021-03-03 DIAGNOSIS — G629 Polyneuropathy, unspecified: Secondary | ICD-10-CM | POA: Diagnosis not present

## 2021-03-03 DIAGNOSIS — M546 Pain in thoracic spine: Secondary | ICD-10-CM

## 2021-03-03 DIAGNOSIS — Z720 Tobacco use: Secondary | ICD-10-CM

## 2021-03-03 MED ORDER — METOPROLOL SUCCINATE ER 25 MG PO TB24
ORAL_TABLET | Freq: Every day | ORAL | 1 refills | Status: DC
  Filled 2021-03-03: qty 90, 90d supply, fill #0
  Filled 2021-06-10 – 2021-07-19 (×2): qty 90, 90d supply, fill #1

## 2021-03-03 MED ORDER — LIDOCAINE 5 % EX PTCH
1.0000 | MEDICATED_PATCH | Freq: Every day | CUTANEOUS | 6 refills | Status: AC
  Filled 2021-03-03: qty 30, 30d supply, fill #0

## 2021-03-03 MED ORDER — PANTOPRAZOLE SODIUM 40 MG PO TBEC
DELAYED_RELEASE_TABLET | ORAL | 1 refills | Status: DC
  Filled 2021-03-03: qty 90, 90d supply, fill #0

## 2021-03-03 MED ORDER — CYCLOBENZAPRINE HCL 10 MG PO TABS
ORAL_TABLET | Freq: Every day | ORAL | 0 refills | Status: DC
  Filled 2021-03-03: qty 30, 30d supply, fill #0

## 2021-03-03 MED ORDER — SPIRONOLACTONE 100 MG PO TABS
ORAL_TABLET | Freq: Every day | ORAL | 1 refills | Status: DC
  Filled 2021-03-03: qty 90, 90d supply, fill #0

## 2021-03-03 NOTE — Progress Notes (Signed)
Subjective:  Patient ID: Richard [name], male    DOB: 09-10-1966  Age: 55 y.o. MRN: [id number]  CC: Foot Pain   HPI Richard [name] is a 55 year old malepatient of Dr. Conni Slipper a history of peptic ulcer disease, alcoholic liver cirrhosis, GERD, SVT, dilated cardiomyopathy (EF 60-65 %),Depressionhere for chronic disease management.  Interval History: Complains of pain in legs to the point that he is unable to walk and is worse when he goes to bed and not as much during the day. During the day he has pain only in his feet. He is unable to dorsiflex his R foot and this has been present for the last 2 weeks Has back pain on the left lumbar region and he had to discontinue PT as it was causing more pain.  Currently wears a back brace which has been beneficial. He is out of his medications for pain which he states have not worked for him.  Smokes 1 pack cig/day; continues to drink alcohol. With regards to his alcoholic liver cirrhosis he has not followed up with GI lately. He applied for disability due to his medical conditions and will be undergoing evaluation by the disability doctor in the next couple of weeks. He has no dyspnea, chest pain. Accompanied by his mom to today's visit.  Past Medical History:  Diagnosis Date  . Abnormal nuclear cardiac imaging test   . Acid reflux   . Acute pancreatitis 08/14/2018  . Alcohol withdrawal delirium ([place]) 08/20/2016  . Alcoholic cardiomyopathy ([place]) 01/07/2020  . Alcoholic hepatitis   . Alcoholic ketoacidosis 4/85/4627  . Ascites 12/13/2019  . Aspiration pneumonia ([place]) 08/20/2016  . Chest pain of uncertain etiology   . Chronic systolic CHF (congestive heart failure) ([place])   . Cirrhosis ([place])   . Colon cancer ([place])   . DCM (dilated cardiomyopathy) ([place])   . Drug abuse ([place]) 01/07/2020  . Elevated troponin 06/27/2018  . ETOH abuse   . 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 Heme positive stool 11/13/2017  . Hepatic steatosis 08/21/2016  . High  anion gap metabolic acidosis 0/12/5007  . History of colon cancer   . HTN (hypertension)   . Hypertensive urgency 06/27/2018  . Hypoglycemia 06/27/2018  . Hypokalemia 01/07/2020  . Hypomagnesemia   . Hypophosphatemia   . Lactic acidosis 08/20/2016  . Leukocytosis 01/07/2020  . Pancreatitis 08/2018  . Polyp of ascending colon   . Prolonged Q-T interval on ECG   . Prolonged QT interval   . Protein-calorie malnutrition, severe 05/02/2018  . PUD (peptic ulcer disease)   . SBP (spontaneous bacterial peritonitis) ([place]) 01/07/2020  . Sepsis ([place]) 08/20/2016  . 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 SVT (supraventricular tachycardia) ([place])   . Thrombocytopenia ([place]) 08/21/2016    Past Surgical History:  Procedure Laterality Date  . BIOPSY  12/14/2019   Procedure: BIOPSY;  Surgeon: Mauri Pole, MD;  Location: [facility];  Service: Endoscopy;;  . catherization  2007  . COLONOSCOPY WITH PROPOFOL N/A 12/14/2019   Procedure: COLONOSCOPY WITH PROPOFOL;  Surgeon: Mauri Pole, MD;  Location: [facility];  Service: Endoscopy;  Laterality: N/A;  . ESOPHAGOGASTRODUODENOSCOPY (EGD) WITH PROPOFOL N/A 12/14/2019   Procedure: ESOPHAGOGASTRODUODENOSCOPY (EGD) WITH PROPOFOL;  Surgeon: Mauri Pole, MD;  Location: [facility];  Service: Endoscopy;  Laterality: N/A;  . HERNIA REPAIR  1969   1 x at birth and at 55 years old  . LAPAROSCOPIC SIGMOID COLECTOMY  2007  . OPEN REDUCTION INTERNAL FIXATION (ORIF) HAND Right 2012  3rd  digit  . POLYPECTOMY  12/14/2019   Procedure: POLYPECTOMY;  Surgeon: Mauri Pole, MD;  Location: [facility];  Service: Endoscopy;;    Family History  Problem Relation Age of Onset  . Colon cancer Father   . Cancer Sister        type unknown  . Kidney disease Sister        dialysis  . Colon cancer Cousin        x 2  . CAD Neg Hx   . Stroke Neg Hx   . Diabetes Neg Hx     Allergies  Allergen Reactions  . Aspirin Other (See Comments)    Acid  reflux   . Penicillins Hives    Has patient had a PCN reaction causing immediate rash, facial/tongue/throat swelling, SOB or lightheadedness with hypotension: yes Has patient had a PCN reaction causing severe rash involving mucus membranes or skin necrosis: no Has patient had a PCN reaction that required hospitalization: no Has patient had a PCN reaction occurring within the last 10 years: no If all of the above answers are "NO", then may proceed with Cephalosporin use.     Outpatient Medications Prior to Visit  Medication Sig Dispense Refill  . aspirin 81 MG EC tablet Take 1 tablet (81 mg total) by mouth daily. 90 tablet 3  . Cholecalciferol (VITAMIN D-3) 125 MCG (5000 UT) TABS Take 2 PO qd x 3 months, then 1 PO qd long-term after that 180 tablet 3  . cyclobenzaprine (FLEXERIL) 10 MG tablet TAKE 1 TABLET (10 MG TOTAL) BY MOUTH AT BEDTIME. 30 tablet 0  . diclofenac Sodium (VOLTAREN) 1 % GEL APPLY 2 G TOPICALLY 4 (FOUR) TIMES DAILY. 100 g 1  . DULoxetine (CYMBALTA) 60 MG capsule TAKE 1 CAPSULE (60 MG TOTAL) BY MOUTH DAILY. 30 capsule 3  . ferrous gluconate (FERGON) 324 MG tablet Take 1 tablet (324 mg total) by mouth daily with breakfast. 30 tablet 2  . furosemide (LASIX) 40 MG tablet TAKE 1 TABLET (40 MG TOTAL) BY MOUTH DAILY. 30 tablet 6  . gabapentin (NEURONTIN) 300 MG capsule TAKE 2 CAPSULES (600 MG TOTAL) BY MOUTH 2 (TWO) TIMES DAILY. 120 capsule 3  . Hyoscyamine Sulfate SL 0.125 MG SUBL PLACE 1 TABLET (0.125 MG TOTAL) UNDER THE TONGUE EVERY 8 (EIGHT) HOURS AS NEEDED. 30 tablet 1  . lactulose, encephalopathy, (CHRONULAC) 10 GM/15ML SOLN TAKE 30 MLS (20 G TOTAL) BY MOUTH 2 (TWO) TIMES DAILY. 1419 mL 0  . lidocaine (LIDODERM) 5 % PLACE 1 PATCH ONTO THE SKIN DAILY. REMOVE & DISCARD PATCH WITHIN 12 HOURS OR AS DIRECTED BY MD (Patient taking differently: 1 patch daily. Remove & discard patch within 12 hours or as directed by MD) 30 patch 0  . magnesium oxide (MAG-OX) 400 (241.3 Mg) MG tablet  Take 1 tablet (400 mg total) by mouth daily. 100 tablet 3  . metoprolol succinate (TOPROL-XL) 25 MG 24 hr tablet TAKE 1 TABLET (25 MG TOTAL) BY MOUTH DAILY. 90 tablet 0  . pantoprazole (PROTONIX) 40 MG tablet TAKE 1 TABLET (40 MG TOTAL) BY MOUTH IN THE MORNING. 90 tablet 0  . spironolactone (ALDACTONE) 100 MG tablet TAKE 1 TABLET (100 MG TOTAL) BY MOUTH DAILY. 30 tablet 0  . furosemide (LASIX) 40 MG tablet Take 1 tablet (40 mg total) by mouth daily. 90 tablet 1  . Hyoscyamine Sulfate SL 0.125 MG SUBL PLACE 1 TABLET (0.125 MG TOTAL) UNDER THE TONGUE EVERY 8 (EIGHT) HOURS AS NEEDED.  30 tablet 1   No facility-administered medications prior to visit.     ROS Review of Systems  Constitutional: Negative for activity change and appetite change.  HENT: Negative for sinus pressure and sore throat.   Eyes: Negative for visual disturbance.  Respiratory: Negative for cough, chest tightness and shortness of breath.   Cardiovascular: Negative for chest pain and leg swelling.  Gastrointestinal: Negative for abdominal distention, abdominal pain, constipation and diarrhea.  Endocrine: Negative.   Genitourinary: Negative for dysuria.  Musculoskeletal:       See HPI  Skin: Negative for rash.  Allergic/Immunologic: Negative.   Neurological: Positive for numbness. Negative for weakness and light-headedness.  Psychiatric/Behavioral: Negative for dysphoric mood and suicidal ideas.    Objective:  BP (!) 145/82   Pulse 74   Ht _0  (1.778 m)   Wt 119 lb 9.6 oz (54.3 kg)   SpO2 100%   BMI 17.16 kg/m   BP/Weight 03/03/2021 10/31/2020 90/30/0923  Systolic BP 300 762 263  Diastolic BP 82 64 71  Wt. (Lbs) 119.6 - 118  BMI 17.16 - 16.93      Physical Exam Constitutional:      Appearance: He is well-developed. He is ill-appearing.  Neck:     Vascular: No JVD.  Cardiovascular:     Rate and Rhythm: Normal rate.     Heart sounds: Normal heart sounds. No murmur heard.   Pulmonary:     Effort:  Pulmonary effort is normal.     Breath sounds: Normal breath sounds. No wheezing or rales.  Chest:     Chest wall: No tenderness.  Abdominal:     General: Bowel sounds are normal. There is distension.     Palpations: Abdomen is soft. There is no mass.     Tenderness: There is no abdominal tenderness.  Musculoskeletal:        General: Normal range of motion.     Right lower leg: No edema.     Left lower leg: No edema.     Comments: Tenderness to palpation of left thoracolumbar region  Neurological:     Mental Status: He is alert and oriented to person, place, and time.     Comments: Unable to dorsiflex right foot  Psychiatric:        Mood and Affect: Mood normal.     CMP Latest Ref Rng & Units 11/06/2020 10/31/2020 09/01/2020  Glucose 65 - 99 mg/dL 67 57(L) 102(H)  BUN 7 - 25 mg/dL 5(L) 7 9  Creatinine 0.70 - 1.33 mg/dL 0.60(L) 0.85 0.80  Sodium 135 - 146 mmol/L 137 136 127(L)  Potassium 3.5 - 5.3 mmol/L 3.7 3.4(L) 3.7  Chloride 98 - 110 mmol/L 101 102 95(L)  CO2 20 - 32 mmol/L 21 16(L) 22  Calcium 8.6 - 10.3 mg/dL 9.2 9.0 9.8  Total Protein 6.1 - 8.1 g/dL 7.7 7.3 -  Total Bilirubin 0.2 - 1.2 mg/dL 1.7(H) 2.0(H) -  Alkaline Phos 38 - 126 U/L - 96 -  AST 10 - 35 U/L 75(H) 73(H) -  ALT 9 - 46 U/L 24 33 -    Lipid Panel     Component Value Date/Time   CHOL 134 01/11/2020 0640   TRIG 149 01/11/2020 0640   HDL <10 (L) 01/11/2020 0640   CHOLHDL NOT CALCULATED 01/11/2020 0640   VLDL 30 01/11/2020 0640   LDLCALC NOT CALCULATED 01/11/2020 0640    CBC    Component Value Date/Time   WBC 4.8 11/06/2020 1503  RBC 3.74 (L) 11/06/2020 1503   HGB 10.0 (L) 11/06/2020 1503   HGB 9.3 (L) 06/02/2020 0850   HGB 15.6 08/31/2007 0955   HCT 30.8 (L) 11/06/2020 1503   HCT 29.2 (L) 06/02/2020 0850   HCT 44.5 08/31/2007 0955   PLT 161 11/06/2020 1503   PLT 245 06/02/2020 0850   MCV 82.4 11/06/2020 1503   MCV 82 06/02/2020 0850   MCV 97.2 08/31/2007 0955   MCH 26.7 (L) 11/06/2020  1503   MCHC 32.5 11/06/2020 1503   RDW 18.1 (H) 11/06/2020 1503   RDW 15.8 (H) 06/02/2020 0850   RDW 13.0 08/31/2007 0955   LYMPHSABS 2,078 11/06/2020 1503   LYMPHSABS 1.0 08/21/2018 1038   LYMPHSABS 1.6 08/31/2007 0955   MONOABS 0.7 08/18/2020 0953   MONOABS 0.6 08/31/2007 0955   EOSABS 48 11/06/2020 1503   EOSABS 0.1 08/21/2018 1038   BASOSABS 101 11/06/2020 1503   BASOSABS 0.1 08/21/2018 1038   BASOSABS 0.0 08/31/2007 0955    Lab Results  Component Value Date   HGBA1C 4.6 11/06/2020    Assessment & Plan:  1. Neuropathy Uncontrolled Has been out of gabapentin which I have refilled - cyclobenzaprine (FLEXERIL) 10 MG tablet; TAKE 1 TABLET (10 MG TOTAL) BY MOUTH AT BEDTIME.  Dispense: 30 tablet; Refill: 0 - Ambulatory referral to Pain Clinic  2. Alcoholic cardiomyopathy (HCC) EF of 60 to 65% from echo 01/2020 Counseled on cessation of alcohol - metoprolol succinate (TOPROL-XL) 25 MG 24 hr tablet; TAKE 1 TABLET (25 MG TOTAL) BY MOUTH DAILY.  Dispense: 90 tablet; Refill: 1  3. Alcoholic hepatitis with ascites Strongly advised to quit alcohol Will refer for paracentesis due to ascites Advised to schedule an appointment with his GI - pantoprazole (PROTONIX) 40 MG tablet; TAKE 1 TABLET (40 MG TOTAL) BY MOUTH IN THE MORNING.  Dispense: 90 tablet; Refill: 1 - spironolactone (ALDACTONE) 100 MG tablet; TAKE 1 TABLET (100 MG TOTAL) BY MOUTH DAILY.  Dispense: 90 tablet; Refill: 1 - IR Paracentesis; Future - CMP14+EGFR - Ammonia - PT AND PTT - CBC with Differential/Platelet  4. Tobacco abuse Spent 3 minutes counseling on cessation of tobacco use but he is not ready to quit.  5. Foot drop, right foot Of recent onset He will benefit from a right AFO brace Referred for PT - Ambulatory referral to Physical Therapy  6. Thoracolumbar back pain Uncontrolled Continue gabapentin Will add on Lidoderm patches - lidocaine (LIDODERM) 5 %; Place 1 patch onto the skin daily. Remove &  discard patch within 12 hours or as directed by MD  Dispense: 30 patch; Refill: 6 - Ambulatory referral to Pain Clinic    No orders of the defined types were placed in this encounter.   Return in about 6 months (around 09/02/2021) for Medical conditions.       Charlott Rakes, MD, FAAFP. [facility] [place], [place]   03/03/2021, 9:43 AM

## 2021-03-03 NOTE — Progress Notes (Signed)
Having pain in legs and right foot. Having back pain. Needs refills on medications.

## 2021-03-03 NOTE — Patient Instructions (Signed)
Rethinking drinking (NIH Publication No. 65-4650). Retrieved from https://pubs.[employer].nl.pdf">  Alcohol Use Disorder Alcohol use disorder is a condition in which drinking disrupts daily life. People with this condition drink too much alcohol and cannot control their drinking. Alcohol use disorder can cause serious problems with physical health. It can affect the brain, heart, and other internal organs. This disorder can raise the risk for certain cancers and cause problems with mental health, such as depression or anxiety. What are the causes? This condition is caused by drinking too much alcohol over time. Some people with this condition drink to cope with or escape from negative life events. Others drink to relieve pain or symptoms of mental illness. What increases the risk? You are more likely to develop this condition if:  You have a family history of alcohol use disorder.  Your culture encourages drinking to the point of becoming drunk (intoxication).  You had a mood or conduct disorder in childhood.  You have been abused.  You are an adolescent and you: ? Have poor performance in school. ? Have poor supervision or guidance. ? Act on impulse and like taking risks. What are the signs or symptoms? Symptoms of this condition include:  Drinking more than you want to.  Trying several times without success to drink less.  Spending a lot of time thinking about alcohol, getting alcohol, drinking, or recovering from drinking.  Continuing to drink even when it is causing serious problems in your daily life.  Drinking when it is dangerous to drink, such as before driving a car.  Needing more and more alcohol to get the same effect you want (building up tolerance).  Having symptoms of withdrawal when you stop drinking. Withdrawal symptoms may include: ? Trouble sleeping, leading to tiredness (fatigue). ? Mood swings of depression  and anxiety. ? Physical symptoms, such as a fast heart rate, rapid breathing, high blood pressure (hypertension), fever, cold sweats, or nausea. ? Seizures. ? Severe confusion. ? Feeling or seeing things that are not there (hallucinations). ? Shaking movements that you cannot control (tremors). How is this diagnosed? This condition is diagnosed with an assessment. Your health care provider may start by asking three or four questions about your drinking, or he or she may give you a simple test to take. This helps to get clear information from you. You may also have a physical exam or lab tests. You may be referred to a substance abuse counselor. How is this treated? With education, some people with alcohol use disorder are able to reduce their drinking. Many with this disorder cannot change their drinking behavior on their own and need help from substance use specialists. These specialists are counselors who can help diagnose how severe your disorder is and what type of treatment you need. Treatments may include:  Detoxification. Detoxification involves quitting drinking with supervision and direction of health care providers. Your health care provider may prescribe prescription medicines within the first week to help lessen withdrawal symptoms. Alcohol withdrawal can be dangerous and life-threatening. Detoxification may be provided in a home, community, or primary care setting, or in a hospital or substance use treatment facility.  Counseling. This may involve motivational interviewing (MI), family therapy, or cognitive behavioral therapy (CBT). It is provided by substance use treatment counselors or professional therapists. A counselor can address the things you can do to change your drinking behavior and how to maintain the changes. Talk therapy aims to: ? Identify your positive motivations to change. ? Identify and avoid the  things that trigger your drinking. ? Help you learn how to plan your  behavior change. ? Develop support systems that can help you sustain the change.  Medicines. Medicines can help treat this disorder by: ? Decreasing cravings. ? Decreasing the positive feeling you have when you drink. ? Causing an uncomfortable physical reaction when you drink (aversion therapy).  Mutual help groups such as Alcoholics Anonymous (AA). These groups are led by people who have quit drinking. The groups provide emotional support, advice, and guidance. Some people with this condition benefit from a combination of treatments provided by specialized substance use treatment centers.   Follow these instructions at home: Medicines  Take over-the-counter and prescription medicines only as told by your health care provider.  Ask before starting any new medicines, herbs, or supplements. General instructions  Ask friends and family members to support your choice to stay sober.  Avoid situations where alcohol is served.  Create a plan to deal with tempting situations.  Attend support groups regularly.  Practice hobbies or activities you enjoy.  Do not drink and drive.  Keep all follow-up visits as told by your health care provider. This is important.   How is this prevented?  If you drink alcohol: ? Limit how much you use to:  0-1 drink a day for nonpregnant women.  0-2 drinks a day for men. ? Be aware of how much alcohol is in your drink. In the [place] one drink equals one 12 oz bottle of beer (355 mL), one 5 oz glass of wine (148 mL), or one 1 oz glass of hard liquor (44 mL).  If you have a mental health condition, seek treatment. Develop a healthy lifestyle through: ? Meditation or deep breathing. ? Exercise. ? Spending time in nature. ? Listening to music. ? Talking with a trusted friend or family member.  If you are an adolescent: ? Do not drink alcohol. Avoid gatherings where you might be tempted. ? Do not be afraid to say no if someone offers you alcohol.  Speak up about why you do not want to drink. Set a positive example for others around you by not drinking. ? Build relationships with friends who do not drink. Where to find more information  Substance Abuse and Mental Health Services Administration: SamedayNews.com.cy  Alcoholics Anonymous: ShedSizes.ch Contact a health care provider if:  You cannot take your medicines as told.  Your symptoms get worse or you experience symptoms of withdrawal when you stop drinking.  You start drinking again (relapse) and your symptoms get worse. Get help right away if:  You have thoughts about hurting yourself or others. If you ever feel like you may hurt yourself or others, or have thoughts about taking your own life, get help right away. Go to your nearest emergency department or:  Call your local emergency services (911 in the [place]  Call a suicide crisis helpline, such as the [place] at 403-499-6586. This is open 24 hours a day in the [place]  Text the Crisis Text Line at 989 797 6081 (in the [place].). Summary  Alcohol use disorder is a condition in which drinking disrupts daily life. People with this condition drink too much alcohol and cannot control their drinking.  Treatment may include detoxification, counseling, medicines, and support groups.  Ask friends and family members to support you. Avoid situations where alcohol is served.  Get help right away if you have thoughts about hurting yourself or others. This information is not intended to replace  advice given to you by your health care provider. Make sure you discuss any questions you have with your health care provider. Document Revised: 08/08/2019 Document Reviewed: 08/08/2019 Elsevier Patient Education  2021 [employer].

## 2021-03-04 LAB — CBC WITH DIFFERENTIAL/PLATELET
Basophils Absolute: 0.1 10*3/uL (ref 0.0–0.2)
Basos: 2 %
EOS (ABSOLUTE): 0.1 10*3/uL (ref 0.0–0.4)
Eos: 2 %
Hematocrit: 31.1 % — ABNORMAL LOW (ref 37.5–51.0)
Hemoglobin: 9.7 g/dL — ABNORMAL LOW (ref 13.0–17.7)
Immature Grans (Abs): 0 10*3/uL (ref 0.0–0.1)
Immature Granulocytes: 0 %
Lymphocytes Absolute: 1.3 10*3/uL (ref 0.7–3.1)
Lymphs: 27 %
MCH: 24.9 pg — ABNORMAL LOW (ref 26.6–33.0)
MCHC: 31.2 g/dL — ABNORMAL LOW (ref 31.5–35.7)
MCV: 80 fL (ref 79–97)
Monocytes Absolute: 0.6 10*3/uL (ref 0.1–0.9)
Monocytes: 12 %
Neutrophils Absolute: 2.6 10*3/uL (ref 1.4–7.0)
Neutrophils: 57 %
Platelets: 123 10*3/uL — ABNORMAL LOW (ref 150–450)
RBC: 3.89 x10E6/uL — ABNORMAL LOW (ref 4.14–5.80)
RDW: 18.9 % — ABNORMAL HIGH (ref 11.6–15.4)
WBC: 4.7 10*3/uL (ref 3.4–10.8)

## 2021-03-04 LAB — CMP14+EGFR
ALT: 25 IU/L (ref 0–44)
AST: 105 IU/L — ABNORMAL HIGH (ref 0–40)
Albumin/Globulin Ratio: 1.2 (ref 1.2–2.2)
Albumin: 3.9 g/dL (ref 3.8–4.9)
Alkaline Phosphatase: 182 IU/L — ABNORMAL HIGH (ref 44–121)
BUN/Creatinine Ratio: 7 — ABNORMAL LOW (ref 9–20)
BUN: 5 mg/dL — ABNORMAL LOW (ref 6–24)
Bilirubin Total: 0.9 mg/dL (ref 0.0–1.2)
CO2: 21 mmol/L (ref 20–29)
Calcium: 9.3 mg/dL (ref 8.7–10.2)
Chloride: 104 mmol/L (ref 96–106)
Creatinine, Ser: 0.76 mg/dL (ref 0.76–1.27)
Globulin, Total: 3.3 g/dL (ref 1.5–4.5)
Glucose: 82 mg/dL (ref 65–99)
Potassium: 4.1 mmol/L (ref 3.5–5.2)
Sodium: 141 mmol/L (ref 134–144)
Total Protein: 7.2 g/dL (ref 6.0–8.5)
eGFR: 107 mL/min/{1.73_m2} (ref >59)

## 2021-03-04 LAB — PT AND PTT
INR: 1.2 (ref 0.9–1.2)
Prothrombin Time: 12.4 s — ABNORMAL HIGH (ref 9.1–12.0)
aPTT: 33 s (ref 24–33)

## 2021-03-04 LAB — AMMONIA: Ammonia: 115 ug/dL (ref 40–200)

## 2021-03-05 ENCOUNTER — Other Ambulatory Visit: Payer: Self-pay

## 2021-03-05 ENCOUNTER — Telehealth: Payer: Self-pay

## 2021-03-05 NOTE — Telephone Encounter (Signed)
Patient name and DOB has been verified Patient was informed of lab results. Patient had no questions.  

## 2021-03-05 NOTE — Telephone Encounter (Signed)
-----   Message from Charlott Rakes, MD sent at 03/05/2021 10:50 AM EDT ----- Please inform him that his liver enzymes are elevated significantly compared to previous labs.  Please advise to work on quitting alcohol and to schedule an appointment with his GI for management of his cirrhosis.

## 2021-03-11 ENCOUNTER — Ambulatory Visit: Payer: Medicaid Other | Admitting: Internal Medicine

## 2021-03-17 ENCOUNTER — Other Ambulatory Visit: Payer: Self-pay | Admitting: Family Medicine

## 2021-03-17 ENCOUNTER — Other Ambulatory Visit: Payer: Self-pay

## 2021-03-17 DIAGNOSIS — K7290 Hepatic failure, unspecified without coma: Secondary | ICD-10-CM

## 2021-03-17 DIAGNOSIS — K7682 Hepatic encephalopathy: Secondary | ICD-10-CM

## 2021-03-17 MED ORDER — LACTULOSE ENCEPHALOPATHY 10 GM/15ML PO SOLN
ORAL | 0 refills | Status: DC
  Filled 2021-03-17: qty 1419, 24d supply, fill #0
  Filled 2021-03-26: qty 1419, 23d supply, fill #0

## 2021-03-18 ENCOUNTER — Ambulatory Visit: Payer: Medicaid Other | Admitting: Physician Assistant

## 2021-03-18 ENCOUNTER — Telehealth: Payer: Self-pay | Admitting: *Deleted

## 2021-03-18 ENCOUNTER — Other Ambulatory Visit: Payer: Self-pay

## 2021-03-18 NOTE — Telephone Encounter (Signed)
Patient called to let agent know that his Rx requested was not at [facility]. ( Lactulose)Agent informed me- so I called the [facility]- they do have the Rx, it is filled and waiting for patiet to pick up. Call to patient- left message on VM- Rx ready at [facility] and ready for pick up.

## 2021-03-24 ENCOUNTER — Encounter: Payer: Self-pay | Admitting: Physical Therapy

## 2021-03-24 ENCOUNTER — Other Ambulatory Visit: Payer: Self-pay

## 2021-03-24 ENCOUNTER — Ambulatory Visit: Payer: Medicaid Other | Attending: Family Medicine | Admitting: Physical Therapy

## 2021-03-24 DIAGNOSIS — M25571 Pain in right ankle and joints of right foot: Secondary | ICD-10-CM

## 2021-03-24 DIAGNOSIS — M6281 Muscle weakness (generalized): Secondary | ICD-10-CM | POA: Insufficient documentation

## 2021-03-24 DIAGNOSIS — R2689 Other abnormalities of gait and mobility: Secondary | ICD-10-CM | POA: Diagnosis present
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
## 2021-03-26 ENCOUNTER — Other Ambulatory Visit: Payer: Self-pay

## 2021-03-31 ENCOUNTER — Ambulatory Visit: Payer: Medicaid Other | Admitting: Physical Therapy

## 2021-04-03 ENCOUNTER — Emergency Department (HOSPITAL_COMMUNITY): Payer: No Typology Code available for payment source

## 2021-04-03 ENCOUNTER — Inpatient Hospital Stay (HOSPITAL_COMMUNITY)
Admission: EM | Admit: 2021-04-03 | Discharge: 2021-04-11 | DRG: 552 | Disposition: A | Discharge status: Home / Self Care | Payer: No Typology Code available for payment source | Attending: General Surgery | Admitting: General Surgery

## 2021-04-03 DIAGNOSIS — D696 Thrombocytopenia, unspecified: Secondary | ICD-10-CM | POA: Diagnosis present

## 2021-04-03 DIAGNOSIS — M542 Cervicalgia: Secondary | ICD-10-CM | POA: Diagnosis present

## 2021-04-03 DIAGNOSIS — F1020 Alcohol dependence, uncomplicated: Secondary | ICD-10-CM | POA: Diagnosis present

## 2021-04-03 DIAGNOSIS — J439 Emphysema, unspecified: Secondary | ICD-10-CM | POA: Diagnosis present

## 2021-04-03 DIAGNOSIS — Z20822 Contact with and (suspected) exposure to covid-19: Secondary | ICD-10-CM | POA: Diagnosis present

## 2021-04-03 DIAGNOSIS — Z23 Encounter for immunization: Secondary | ICD-10-CM | POA: Diagnosis not present

## 2021-04-03 DIAGNOSIS — Z85038 Personal history of other malignant neoplasm of large intestine: Secondary | ICD-10-CM

## 2021-04-03 DIAGNOSIS — T1490XA Injury, unspecified, initial encounter: Secondary | ICD-10-CM

## 2021-04-03 DIAGNOSIS — S2232XA Fracture of one rib, left side, initial encounter for closed fracture: Secondary | ICD-10-CM

## 2021-04-03 DIAGNOSIS — I471 Supraventricular tachycardia: Secondary | ICD-10-CM | POA: Diagnosis present

## 2021-04-03 DIAGNOSIS — S92351S Displaced fracture of fifth metatarsal bone, right foot, sequela: Secondary | ICD-10-CM

## 2021-04-03 DIAGNOSIS — H5462 Unqualified visual loss, left eye, normal vision right eye: Secondary | ICD-10-CM | POA: Diagnosis present

## 2021-04-03 DIAGNOSIS — S92341S Displaced fracture of fourth metatarsal bone, right foot, sequela: Secondary | ICD-10-CM

## 2021-04-03 DIAGNOSIS — E876 Hypokalemia: Secondary | ICD-10-CM | POA: Diagnosis present

## 2021-04-03 DIAGNOSIS — R208 Other disturbances of skin sensation: Secondary | ICD-10-CM | POA: Diagnosis present

## 2021-04-03 DIAGNOSIS — M25512 Pain in left shoulder: Secondary | ICD-10-CM | POA: Diagnosis present

## 2021-04-03 DIAGNOSIS — S22088A Other fracture of T11-T12 vertebra, initial encounter for closed fracture: Secondary | ICD-10-CM

## 2021-04-03 DIAGNOSIS — M25511 Pain in right shoulder: Secondary | ICD-10-CM | POA: Diagnosis present

## 2021-04-03 DIAGNOSIS — S22089A Unspecified fracture of T11-T12 vertebra, initial encounter for closed fracture: Principal | ICD-10-CM | POA: Diagnosis present

## 2021-04-03 DIAGNOSIS — Z79899 Other long term (current) drug therapy: Secondary | ICD-10-CM | POA: Diagnosis not present

## 2021-04-03 DIAGNOSIS — K7030 Alcoholic cirrhosis of liver without ascites: Secondary | ICD-10-CM | POA: Diagnosis present

## 2021-04-03 DIAGNOSIS — S22010A Wedge compression fracture of first thoracic vertebra, initial encounter for closed fracture: Secondary | ICD-10-CM | POA: Diagnosis present

## 2021-04-03 DIAGNOSIS — Y92410 Unspecified street and highway as the place of occurrence of the external cause: Secondary | ICD-10-CM

## 2021-04-03 DIAGNOSIS — Z886 Allergy status to analgesic agent status: Secondary | ICD-10-CM

## 2021-04-03 DIAGNOSIS — Z880 Allergy status to penicillin: Secondary | ICD-10-CM

## 2021-04-03 LAB — COMPREHENSIVE METABOLIC PANEL
ALT: 32 U/L (ref 0–44)
AST: 162 U/L — ABNORMAL HIGH (ref 15–41)
Albumin: 3.2 g/dL — ABNORMAL LOW (ref 3.5–5.0)
Alkaline Phosphatase: 132 U/L — ABNORMAL HIGH (ref 38–126)
Anion gap: 14 (ref 5–15)
BUN: 6 mg/dL (ref 6–20)
CO2: 17 mmol/L — ABNORMAL LOW (ref 22–32)
Calcium: 9.2 mg/dL (ref 8.9–10.3)
Chloride: 103 mmol/L (ref 98–111)
Creatinine, Ser: 0.7 mg/dL (ref 0.61–1.24)
GFR, Estimated: >60 mL/min (ref >60)
Glucose, Bld: 90 mg/dL (ref 70–99)
Potassium: 3.5 mmol/L (ref 3.5–5.1)
Sodium: 134 mmol/L — ABNORMAL LOW (ref 135–145)
Total Bilirubin: 0.6 mg/dL (ref 0.3–1.2)
Total Protein: 6.9 g/dL (ref 6.5–8.1)

## 2021-04-03 LAB — CBC
HCT: 30 % — ABNORMAL LOW (ref 39.0–52.0)
HCT: 30.3 % — ABNORMAL LOW (ref 39.0–52.0)
Hemoglobin: 9.8 g/dL — ABNORMAL LOW (ref 13.0–17.0)
Hemoglobin: 9.9 g/dL — ABNORMAL LOW (ref 13.0–17.0)
MCH: 26.8 pg (ref 26.0–34.0)
MCH: 26.3 pg (ref 26.0–34.0)
MCHC: 32.7 g/dL (ref 30.0–36.0)
MCHC: 32.7 g/dL (ref 30.0–36.0)
MCV: 82 fL (ref 80.0–100.0)
MCV: 80.6 fL (ref 80.0–100.0)
Platelets: 130 10*3/uL — ABNORMAL LOW (ref 150–400)
Platelets: 122 10*3/uL — ABNORMAL LOW (ref 150–400)
RBC: 3.66 MIL/uL — ABNORMAL LOW (ref 4.22–5.81)
RBC: 3.76 MIL/uL — ABNORMAL LOW (ref 4.22–5.81)
RDW: 21.3 % — ABNORMAL HIGH (ref 11.5–15.5)
RDW: 20.6 % — ABNORMAL HIGH (ref 11.5–15.5)
WBC: 7.3 10*3/uL (ref 4.0–10.5)
WBC: 7 10*3/uL (ref 4.0–10.5)
nRBC: 0 % (ref 0.0–0.2)
nRBC: 0 % (ref 0.0–0.2)

## 2021-04-03 LAB — I-STAT CHEM 8, ED
BUN: 5 mg/dL — ABNORMAL LOW (ref 6–20)
Calcium, Ion: 1.06 mmol/L — ABNORMAL LOW (ref 1.15–1.40)
Chloride: 103 mmol/L (ref 98–111)
Creatinine, Ser: 0.8 mg/dL (ref 0.61–1.24)
Glucose, Bld: 90 mg/dL (ref 70–99)
HCT: 34 % — ABNORMAL LOW (ref 39.0–52.0)
Hemoglobin: 11.6 g/dL — ABNORMAL LOW (ref 13.0–17.0)
Potassium: 3.5 mmol/L (ref 3.5–5.1)
Sodium: 136 mmol/L (ref 135–145)
TCO2: 17 mmol/L — ABNORMAL LOW (ref 22–32)

## 2021-04-03 LAB — RESP PANEL BY RT-PCR (FLU A&B, COVID) ARPGX2
Influenza A by PCR: NEGATIVE
Influenza B by PCR: NEGATIVE
SARS Coronavirus 2 by RT PCR: NEGATIVE

## 2021-04-03 LAB — SAMPLE TO BLOOD BANK

## 2021-04-03 LAB — URINALYSIS, ROUTINE W REFLEX MICROSCOPIC
Bilirubin Urine: NEGATIVE
Glucose, UA: NEGATIVE mg/dL
Hgb urine dipstick: NEGATIVE
Ketones, ur: 5 mg/dL — AB
Leukocytes,Ua: NEGATIVE
Nitrite: NEGATIVE
Protein, ur: NEGATIVE mg/dL
Specific Gravity, Urine: 1.033 — ABNORMAL HIGH (ref 1.005–1.030)
pH: 6 (ref 5.0–8.0)

## 2021-04-03 LAB — CREATININE, SERUM
Creatinine, Ser: 0.65 mg/dL (ref 0.61–1.24)
GFR, Estimated: >60 mL/min (ref >60)

## 2021-04-03 LAB — PROTIME-INR
INR: 1.2 (ref 0.8–1.2)
Prothrombin Time: 15.6 seconds — ABNORMAL HIGH (ref 11.4–15.2)

## 2021-04-03 LAB — ETHANOL: Alcohol, Ethyl (B): 295 mg/dL — ABNORMAL HIGH (ref <10)

## 2021-04-03 LAB — LACTIC ACID, PLASMA: Lactic Acid, Venous: 3.4 mmol/L (ref 0.5–1.9)

## 2021-04-03 LAB — MAGNESIUM: Magnesium: 1.3 mg/dL — ABNORMAL LOW (ref 1.7–2.4)

## 2021-04-03 LAB — PHOSPHORUS: Phosphorus: 3.3 mg/dL (ref 2.5–4.6)

## 2021-04-03 MED ORDER — IOHEXOL 300 MG/ML  SOLN
100.0000 mL | Freq: Once | INTRAMUSCULAR | Status: AC | PRN
  Administered 2021-04-03: 100 mL via INTRAVENOUS

## 2021-04-03 MED ORDER — SPIRONOLACTONE 100 MG PO TABS
100.0000 mg | ORAL_TABLET | Freq: Every day | ORAL | Status: DC
  Administered 2021-04-04 – 2021-04-11 (×8): 100 mg via ORAL
  Filled 2021-04-03 (×6): qty 1
  Filled 2021-04-03: qty 4
  Filled 2021-04-03: qty 1

## 2021-04-03 MED ORDER — TRAMADOL HCL 50 MG PO TABS
50.0000 mg | ORAL_TABLET | Freq: Four times a day (QID) | ORAL | Status: DC | PRN
  Administered 2021-04-09 – 2021-04-11 (×4): 50 mg via ORAL
  Filled 2021-04-03 (×4): qty 1

## 2021-04-03 MED ORDER — ONDANSETRON HCL 4 MG/2ML IJ SOLN
4.0000 mg | Freq: Four times a day (QID) | INTRAMUSCULAR | Status: DC | PRN
  Administered 2021-04-03 – 2021-04-04 (×2): 4 mg via INTRAVENOUS
  Filled 2021-04-03 (×2): qty 2

## 2021-04-03 MED ORDER — LORAZEPAM 2 MG/ML IJ SOLN
1.0000 mg | INTRAMUSCULAR | Status: AC | PRN
Start: 2021-04-03 — End: 2021-04-06
  Administered 2021-04-04: 1 mg via INTRAVENOUS
  Administered 2021-04-04 – 2021-04-05 (×2): 2 mg via INTRAVENOUS
  Filled 2021-04-03 (×3): qty 1

## 2021-04-03 MED ORDER — FOLIC ACID 1 MG PO TABS
1.0000 mg | ORAL_TABLET | Freq: Every day | ORAL | Status: DC
  Administered 2021-04-04 – 2021-04-11 (×8): 1 mg via ORAL
  Filled 2021-04-03 (×8): qty 1

## 2021-04-03 MED ORDER — PANTOPRAZOLE SODIUM 40 MG PO TBEC
40.0000 mg | DELAYED_RELEASE_TABLET | Freq: Every day | ORAL | Status: DC
  Administered 2021-04-04 – 2021-04-11 (×8): 40 mg via ORAL
  Filled 2021-04-03 (×8): qty 1

## 2021-04-03 MED ORDER — FENTANYL CITRATE (PF) 100 MCG/2ML IJ SOLN
INTRAMUSCULAR | Status: AC
  Filled 2021-04-03: qty 2

## 2021-04-03 MED ORDER — SODIUM CHLORIDE 0.9% FLUSH
3.0000 mL | Freq: Two times a day (BID) | INTRAVENOUS | Status: DC
  Administered 2021-04-04 – 2021-04-10 (×15): 3 mL via INTRAVENOUS

## 2021-04-03 MED ORDER — HYDROMORPHONE HCL 1 MG/ML IJ SOLN
2.0000 mg | Freq: Once | INTRAMUSCULAR | Status: AC
  Administered 2021-04-03: 2 mg via INTRAVENOUS
  Filled 2021-04-03: qty 2

## 2021-04-03 MED ORDER — SODIUM CHLORIDE 0.9% FLUSH: 3.0000 mL | INTRAVENOUS | Status: DC | PRN

## 2021-04-03 MED ORDER — LACTULOSE 10 GM/15ML PO SOLN
20.0000 g | Freq: Every day | ORAL | Status: DC
  Administered 2021-04-04 – 2021-04-11 (×8): 20 g via ORAL
  Filled 2021-04-03 (×8): qty 30

## 2021-04-03 MED ORDER — DULOXETINE HCL 60 MG PO CPEP
60.0000 mg | ORAL_CAPSULE | Freq: Every day | ORAL | Status: DC
  Administered 2021-04-04 – 2021-04-11 (×8): 60 mg via ORAL
  Filled 2021-04-03 (×8): qty 1

## 2021-04-03 MED ORDER — METOPROLOL TARTRATE 5 MG/5ML IV SOLN: 5.0000 mg | Freq: Four times a day (QID) | INTRAVENOUS | Status: DC | PRN

## 2021-04-03 MED ORDER — TETANUS-DIPHTH-ACELL PERTUSSIS 5-2.5-18.5 LF-MCG/0.5 IM SUSY
0.5000 mL | PREFILLED_SYRINGE | Freq: Once | INTRAMUSCULAR | Status: AC
  Administered 2021-04-03: 0.5 mL via INTRAMUSCULAR
  Filled 2021-04-03: qty 0.5

## 2021-04-03 MED ORDER — OXYCODONE HCL 5 MG PO TABS
5.0000 mg | ORAL_TABLET | ORAL | Status: DC | PRN
  Administered 2021-04-03 – 2021-04-06 (×4): 10 mg via ORAL
  Filled 2021-04-03 (×4): qty 2

## 2021-04-03 MED ORDER — ENOXAPARIN SODIUM 30 MG/0.3ML IJ SOSY
30.0000 mg | PREFILLED_SYRINGE | Freq: Two times a day (BID) | INTRAMUSCULAR | Status: DC
  Administered 2021-04-05 – 2021-04-11 (×13): 30 mg via SUBCUTANEOUS
  Filled 2021-04-03 (×13): qty 0.3

## 2021-04-03 MED ORDER — FENTANYL CITRATE (PF) 100 MCG/2ML IJ SOLN
INTRAMUSCULAR | Status: AC | PRN
  Administered 2021-04-03 (×2): 50 ug via INTRAVENOUS

## 2021-04-03 MED ORDER — THIAMINE HCL 100 MG PO TABS
100.0000 mg | ORAL_TABLET | Freq: Every day | ORAL | Status: DC
  Administered 2021-04-04 – 2021-04-11 (×7): 100 mg via ORAL
  Filled 2021-04-03 (×8): qty 1

## 2021-04-03 MED ORDER — THIAMINE HCL 100 MG/ML IJ SOLN
100.0000 mg | Freq: Every day | INTRAMUSCULAR | Status: DC
  Administered 2021-04-06: 100 mg via INTRAVENOUS
  Filled 2021-04-03: qty 2

## 2021-04-03 MED ORDER — FENTANYL CITRATE (PF) 100 MCG/2ML IJ SOLN
INTRAMUSCULAR | Status: AC | PRN
  Administered 2021-04-03: 100 ug via INTRAVENOUS

## 2021-04-03 MED ORDER — ADULT MULTIVITAMIN W/MINERALS CH
1.0000 | ORAL_TABLET | Freq: Every day | ORAL | Status: DC
  Administered 2021-04-04 – 2021-04-11 (×8): 1 via ORAL
  Filled 2021-04-03 (×9): qty 1

## 2021-04-03 MED ORDER — GABAPENTIN 300 MG PO CAPS
300.0000 mg | ORAL_CAPSULE | Freq: Three times a day (TID) | ORAL | Status: DC
  Administered 2021-04-03 – 2021-04-11 (×23): 300 mg via ORAL
  Filled 2021-04-03 (×23): qty 1

## 2021-04-03 MED ORDER — CYCLOBENZAPRINE HCL 10 MG PO TABS
10.0000 mg | ORAL_TABLET | Freq: Three times a day (TID) | ORAL | Status: DC | PRN
  Administered 2021-04-03 – 2021-04-06 (×4): 10 mg via ORAL
  Filled 2021-04-03 (×4): qty 1

## 2021-04-03 MED ORDER — HYDROMORPHONE HCL 1 MG/ML IJ SOLN
1.0000 mg | Freq: Once | INTRAMUSCULAR | Status: AC
Start: 2021-04-03 — End: 2021-04-03
  Administered 2021-04-03: 1 mg via INTRAVENOUS
  Filled 2021-04-03: qty 1

## 2021-04-03 MED ORDER — HYDROMORPHONE HCL 1 MG/ML IJ SOLN: 0.5000 mg | INTRAMUSCULAR | Status: DC | PRN

## 2021-04-03 MED ORDER — HYDROMORPHONE HCL 1 MG/ML IJ SOLN
0.5000 mg | INTRAMUSCULAR | Status: DC | PRN
  Administered 2021-04-04 (×2): 1 mg via INTRAVENOUS
  Administered 2021-04-04 (×2): 2 mg via INTRAVENOUS
  Administered 2021-04-05 – 2021-04-06 (×2): 1 mg via INTRAVENOUS
  Filled 2021-04-03 (×2): qty 1
  Filled 2021-04-03: qty 2
  Filled 2021-04-03: qty 1
  Filled 2021-04-03: qty 2
  Filled 2021-04-03: qty 1

## 2021-04-03 MED ORDER — HYDROMORPHONE HCL 1 MG/ML IJ SOLN
1.0000 mg | Freq: Once | INTRAMUSCULAR | Status: AC
  Administered 2021-04-03: 1 mg via INTRAVENOUS
  Filled 2021-04-03: qty 1

## 2021-04-03 MED ORDER — SODIUM CHLORIDE 0.9 % IV SOLN: 250.0000 mL | INTRAVENOUS | Status: DC | PRN

## 2021-04-03 MED ORDER — DICLOFENAC SODIUM 1 % EX GEL
1.0000 "application " | Freq: Three times a day (TID) | CUTANEOUS | Status: DC
  Administered 2021-04-04 – 2021-04-11 (×23): 1 via TOPICAL
  Filled 2021-04-03: qty 100

## 2021-04-03 MED ORDER — ONDANSETRON 4 MG PO TBDP: 4.0000 mg | ORAL_TABLET | Freq: Four times a day (QID) | ORAL | Status: DC | PRN

## 2021-04-03 MED ORDER — LORAZEPAM 1 MG PO TABS
1.0000 mg | ORAL_TABLET | ORAL | Status: AC | PRN
Start: 2021-04-03 — End: 2021-04-06
  Administered 2021-04-06: 1 mg via ORAL
  Filled 2021-04-03: qty 1

## 2021-04-03 MED ORDER — DOCUSATE SODIUM 100 MG PO CAPS
100.0000 mg | ORAL_CAPSULE | Freq: Two times a day (BID) | ORAL | Status: DC
  Administered 2021-04-03 – 2021-04-11 (×15): 100 mg via ORAL
  Filled 2021-04-03 (×15): qty 1

## 2021-04-03 MED ORDER — METOPROLOL SUCCINATE ER 25 MG PO TB24
25.0000 mg | ORAL_TABLET | Freq: Every day | ORAL | Status: DC
  Administered 2021-04-04 – 2021-04-11 (×8): 25 mg via ORAL
  Filled 2021-04-03 (×8): qty 1

## 2021-04-03 NOTE — ED Notes (Signed)
Patient transported to CT via stretcher accompanied by RN and on transport monitor.

## 2021-04-03 NOTE — ED Notes (Signed)
Attempted to contact pt mother per her and pt request no answer voicemail left
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
## 2021-04-03 NOTE — ED Notes (Signed)
Portable chest Xray complete. Per MD, no obvious hemothorax/pneumothorax noted.

## 2021-04-03 NOTE — ED Notes (Signed)
Pt transported to MRI via stretcher at this time.  °

## 2021-04-03 NOTE — ED Notes (Signed)
Portable Xray pelvis completed. Per MD, pelvic ring intact.
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
## 2021-04-03 NOTE — ED Notes (Signed)
Trauma returned call and orders requested at this time

## 2021-04-03 NOTE — ED Notes (Signed)
Spoke to ED provider reference to pt c/o pain and requesting more pain meds and requesting something to eat and drink. Per provider he could eat and drink and he would put knew orders in for pain meds. Provider also requested to have trauma paged and request admit orders for pt as well. Spoke with Network engineer and requested to page trauma to this RN number

## 2021-04-03 NOTE — ED Notes (Signed)
Attempted to call and update mother on pt per request. No answer.

## 2021-04-03 NOTE — Progress Notes (Signed)
Orthopedic Tech Progress Note Patient Details:  Richard [name] 06-29-1966 [id number] Level 2 Trauma  Patient ID: Richard [name], male   DOB: [birth year], 55 y.o.   MRN: [id number]  Richard [name] 04/03/2021, 12:45 PM

## 2021-04-03 NOTE — ED Notes (Signed)
Verbal report received from [place] N RN at this time
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
## 2021-04-03 NOTE — ED Notes (Signed)
Per trauma give a dose of Dilaudid 2mg  now and give Flexeril for pt pain at this time
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
## 2021-04-03 NOTE — ED Notes (Signed)
Pt pain increasing.  Describes pain as burning - even on palpation of arms, pt screams.  Pt stated he was unable to move arms - upon further examination pt can make small movements with arms, put movement appears to be limited d/t pain.  Dr Rogene Houston notified and stated to give dilaudid and that he is awaiting neurosurgery consult.
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
## 2021-04-04 ENCOUNTER — Inpatient Hospital Stay (HOSPITAL_COMMUNITY): Payer: No Typology Code available for payment source

## 2021-04-04 LAB — COMPREHENSIVE METABOLIC PANEL
ALT: 33 U/L (ref 0–44)
AST: 95 U/L — ABNORMAL HIGH (ref 15–41)
Albumin: 3.3 g/dL — ABNORMAL LOW (ref 3.5–5.0)
Alkaline Phosphatase: 111 U/L (ref 38–126)
Anion gap: 17 — ABNORMAL HIGH (ref 5–15)
BUN: 6 mg/dL (ref 6–20)
CO2: 17 mmol/L — ABNORMAL LOW (ref 22–32)
Calcium: 9.1 mg/dL (ref 8.9–10.3)
Chloride: 100 mmol/L (ref 98–111)
Creatinine, Ser: 0.73 mg/dL (ref 0.61–1.24)
GFR, Estimated: >60 mL/min (ref >60)
Glucose, Bld: 70 mg/dL (ref 70–99)
Potassium: 3.3 mmol/L — ABNORMAL LOW (ref 3.5–5.1)
Sodium: 134 mmol/L — ABNORMAL LOW (ref 135–145)
Total Bilirubin: 1.9 mg/dL — ABNORMAL HIGH (ref 0.3–1.2)
Total Protein: 7.4 g/dL (ref 6.5–8.1)

## 2021-04-04 LAB — CBC
HCT: 31.9 % — ABNORMAL LOW (ref 39.0–52.0)
Hemoglobin: 10.3 g/dL — ABNORMAL LOW (ref 13.0–17.0)
MCH: 26.8 pg (ref 26.0–34.0)
MCHC: 32.3 g/dL (ref 30.0–36.0)
MCV: 82.9 fL (ref 80.0–100.0)
Platelets: 112 10*3/uL — ABNORMAL LOW (ref 150–400)
RBC: 3.85 MIL/uL — ABNORMAL LOW (ref 4.22–5.81)
RDW: 20.8 % — ABNORMAL HIGH (ref 11.5–15.5)
WBC: 7.1 10*3/uL (ref 4.0–10.5)
nRBC: 0 % (ref 0.0–0.2)

## 2021-04-04 LAB — PROTIME-INR
INR: 1.4 — ABNORMAL HIGH (ref 0.8–1.2)
Prothrombin Time: 16.7 seconds — ABNORMAL HIGH (ref 11.4–15.2)

## 2021-04-04 LAB — HIV ANTIBODY (ROUTINE TESTING W REFLEX): HIV Screen 4th Generation wRfx: NONREACTIVE

## 2021-04-04 LAB — MRSA NEXT GEN BY PCR, NASAL: MRSA by PCR Next Gen: NOT DETECTED

## 2021-04-04 NOTE — ED Notes (Signed)
Attempted report 
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
## 2021-04-04 NOTE — ED Notes (Signed)
Patient transported to x-ray. ?

## 2021-04-04 NOTE — ED Notes (Signed)
Please call mom Amedeo Plenty @ [phone] stated she tried to call and got no answer.  Stated she would call her back after a break

## 2021-04-04 NOTE — ED Notes (Signed)
[id number] 

## 2021-04-04 NOTE — ED Notes (Signed)
Ordered Breakfast 

## 2021-04-05 NOTE — Progress Notes (Signed)
ESTANISLADO SURGEON is a 55 y.o. male patient admitted. Awake, alert - oriented  X 4 - no acute distress noted.  VSS - Blood pressure 129/85, pulse 88, temperature 98.8 F (37.1 C), temperature source Oral, resp. rate 18, height 6\' 1"  (1.854 m), weight 54.4 kg, SpO2 100 %.    IV in place, occlusive dsg intact without redness.  Orientation to room, and floor completed.  Admission INP armband ID verified with patient/family, and in place.   SR up x 2, fall assessment complete, with patient and family able to verbalize understanding of risk associated with falls, and verbalized understanding to call nsg before up out of bed.  Call light within reach, patient able to voice, and demonstrate understanding. No evidence of skin break down noted on exam.      Will cont to eval and treat per MD orders.  Gordy Savers, RN 04/05/2021 6:42 PM
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
## 2021-04-05 NOTE — Progress Notes (Signed)
Orthopedic Tech Progress Note Patient Details:  Richard [name] 25-Dec-1965 [id number] Called in order to [employer] for a CTO   Patient ID: Richard [name], male   DOB: [birth year], 55 y.o.   MRN: [id number]  Janit Pagan 04/05/2021, 10:34 AM

## 2021-04-05 NOTE — Plan of Care (Signed)

## 2021-04-06 MED ORDER — POTASSIUM CHLORIDE CRYS ER 20 MEQ PO TBCR
30.0000 meq | EXTENDED_RELEASE_TABLET | Freq: Two times a day (BID) | ORAL | Status: AC
  Administered 2021-04-06 (×2): 30 meq via ORAL
  Filled 2021-04-06 (×2): qty 1

## 2021-04-06 MED ORDER — HYDROMORPHONE HCL 1 MG/ML IJ SOLN
0.5000 mg | INTRAMUSCULAR | Status: DC | PRN
  Administered 2021-04-06: 0.5 mg via INTRAVENOUS
  Filled 2021-04-06: qty 0.5
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
## 2021-04-06 NOTE — Progress Notes (Signed)
Pt keep pulling continous pulse ox off, also pulling monitor leads off
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
## 2021-04-07 ENCOUNTER — Telehealth: Payer: Self-pay | Admitting: Physical Therapy

## 2021-04-07 ENCOUNTER — Ambulatory Visit: Payer: Medicaid Other | Attending: Family Medicine | Admitting: Physical Therapy

## 2021-04-07 LAB — COMPREHENSIVE METABOLIC PANEL
ALT: 28 U/L (ref 0–44)
AST: 60 U/L — ABNORMAL HIGH (ref 15–41)
Albumin: 3.3 g/dL — ABNORMAL LOW (ref 3.5–5.0)
Alkaline Phosphatase: 119 U/L (ref 38–126)
Anion gap: 7 (ref 5–15)
BUN: 5 mg/dL — ABNORMAL LOW (ref 6–20)
CO2: 25 mmol/L (ref 22–32)
Calcium: 9.5 mg/dL (ref 8.9–10.3)
Chloride: 95 mmol/L — ABNORMAL LOW (ref 98–111)
Creatinine, Ser: 0.64 mg/dL (ref 0.61–1.24)
GFR, Estimated: >60 mL/min (ref >60)
Glucose, Bld: 101 mg/dL — ABNORMAL HIGH (ref 70–99)
Potassium: 5.4 mmol/L — ABNORMAL HIGH (ref 3.5–5.1)
Sodium: 127 mmol/L — ABNORMAL LOW (ref 135–145)
Total Bilirubin: 1.4 mg/dL — ABNORMAL HIGH (ref 0.3–1.2)
Total Protein: 7.7 g/dL (ref 6.5–8.1)

## 2021-04-07 LAB — CBC
HCT: 34.3 % — ABNORMAL LOW (ref 39.0–52.0)
Hemoglobin: 10.9 g/dL — ABNORMAL LOW (ref 13.0–17.0)
MCH: 26.3 pg (ref 26.0–34.0)
MCHC: 31.8 g/dL (ref 30.0–36.0)
MCV: 82.7 fL (ref 80.0–100.0)
Platelets: 145 10*3/uL — ABNORMAL LOW (ref 150–400)
RBC: 4.15 MIL/uL — ABNORMAL LOW (ref 4.22–5.81)
RDW: 19.7 % — ABNORMAL HIGH (ref 11.5–15.5)
WBC: 5 10*3/uL (ref 4.0–10.5)
nRBC: 0 % (ref 0.0–0.2)

## 2021-04-07 LAB — BASIC METABOLIC PANEL
Anion gap: 7 (ref 5–15)
BUN: 5 mg/dL — ABNORMAL LOW (ref 6–20)
CO2: 26 mmol/L (ref 22–32)
Calcium: 9.5 mg/dL (ref 8.9–10.3)
Chloride: 96 mmol/L — ABNORMAL LOW (ref 98–111)
Creatinine, Ser: 0.66 mg/dL (ref 0.61–1.24)
GFR, Estimated: >60 mL/min (ref >60)
Glucose, Bld: 91 mg/dL (ref 70–99)
Potassium: 4.5 mmol/L (ref 3.5–5.1)
Sodium: 129 mmol/L — ABNORMAL LOW (ref 135–145)

## 2021-04-07 LAB — PHOSPHORUS: Phosphorus: 2.2 mg/dL — ABNORMAL LOW (ref 2.5–4.6)

## 2021-04-07 LAB — MAGNESIUM: Magnesium: 1.6 mg/dL — ABNORMAL LOW (ref 1.7–2.4)

## 2021-04-07 MED ORDER — SPIRITUS FRUMENTI
1.0000 | Freq: Two times a day (BID) | ORAL | Status: DC
  Administered 2021-04-07: 1 via ORAL
  Filled 2021-04-07 (×2): qty 1

## 2021-04-07 MED ORDER — LORAZEPAM 2 MG/ML IJ SOLN
1.0000 mg | INTRAMUSCULAR | Status: AC | PRN
  Administered 2021-04-07: 4 mg via INTRAVENOUS
  Administered 2021-04-07: 2 mg via INTRAVENOUS
  Filled 2021-04-07: qty 1
  Filled 2021-04-07: qty 2

## 2021-04-07 MED ORDER — LORAZEPAM 1 MG PO TABS
1.0000 mg | ORAL_TABLET | ORAL | Status: AC | PRN
  Administered 2021-04-07: 4 mg via ORAL
  Filled 2021-04-07: qty 4
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
## 2021-04-07 NOTE — Plan of Care (Signed)

## 2021-04-07 NOTE — Telephone Encounter (Signed)
Left voicemail for Richard [name] regarding no-show to appointment today. Left reminder of attendance policy and reminder of next appointment date and time. The clinic phone number was left on his voicemail in case he would like to reschedule for another day this week.

## 2021-04-08 LAB — BASIC METABOLIC PANEL
Anion gap: 11 (ref 5–15)
BUN: 8 mg/dL (ref 6–20)
CO2: 22 mmol/L (ref 22–32)
Calcium: 9.6 mg/dL (ref 8.9–10.3)
Chloride: 96 mmol/L — ABNORMAL LOW (ref 98–111)
Creatinine, Ser: 0.67 mg/dL (ref 0.61–1.24)
GFR, Estimated: >60 mL/min (ref >60)
Glucose, Bld: 89 mg/dL (ref 70–99)
Potassium: 4.4 mmol/L (ref 3.5–5.1)
Sodium: 129 mmol/L — ABNORMAL LOW (ref 135–145)
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
## 2021-04-08 NOTE — Plan of Care (Signed)
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
## 2021-04-08 NOTE — Progress Notes (Signed)
Pt requested morning medicine
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
## 2021-04-09 NOTE — Plan of Care (Signed)

## 2021-04-10 LAB — CREATININE, SERUM
Creatinine, Ser: 0.83 mg/dL (ref 0.61–1.24)
GFR, Estimated: >60 mL/min (ref >60)

## 2021-04-10 NOTE — Plan of Care (Signed)
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
## 2021-04-11 MED ORDER — OXYCODONE HCL 5 MG PO TABS
5.0000 mg | ORAL_TABLET | ORAL | 0 refills | Status: DC | PRN
  Filled 2021-04-11: qty 20, 2d supply, fill #0

## 2021-04-11 MED ORDER — GABAPENTIN 100 MG PO CAPS
100.0000 mg | ORAL_CAPSULE | Freq: Three times a day (TID) | ORAL | 0 refills | Status: DC | PRN
  Filled 2021-04-11: qty 30, 10d supply, fill #0

## 2021-04-11 NOTE — TOC CAGE-AID Note (Signed)
Transition of Care [facility]) - CAGE-AID Screening   Patient Details  Name: Richard [name] MRN: [id number] Date of Birth: [birth year]  Transition [facility] Care [facility]) CM/SW Contact:    Bartholomew Crews, RN Phone Number: [phone] 04/11/2021, 10:00 AM   Clinical Narrative:  Discussed resources for alcohol abuse including urgent care services and hotlines.   CAGE-AID Screening:    Have You Ever Felt You Ought to Cut Down on Your Drinking or Drug Use?: Yes Have People Annoyed You By Critizing Your Drinking Or Drug Use?: Yes Have You Felt Bad Or Guilty About Your Drinking Or Drug Use?: Yes Have You Ever Had a Drink or Used Drugs First Thing In The Morning to Steady Your Nerves or to Get Rid of a Hangover?: Yes CAGE-AID Score: 4  Substance Abuse Education Offered: Yes  Substance abuse interventions: Patient Counseling

## 2021-04-11 NOTE — TOC Transition Note (Addendum)
Transition of Care [facility]) - CM/SW Discharge Note   Patient Details  Name: Richard [name] MRN: [id number] Date of Birth: [birth year]  [facility] CM/SW Contact:  Bartholomew Crews, RN Phone Number: [phone] 04/11/2021, 9:19 AM   Clinical Narrative:     Patient to transition home today. No home health available. Outpatient rehab for PT and OT referrals placed. DME orders for 3N1, RW, tub bench - referral called to [employer] for delivery to the room.   9:45 am - spoke with patient at the bedside to discuss transition plans. Advised of DME being delivered to the room. Advised of outpatient rehab, and encouraged to follow up for appointment. Patient stated that his family will provide transport in private vehicle. No further TOC needs identified at this time.   Final next level of care: OP Rehab Barriers to Discharge: No Barriers Identified   Patient Goals and CMS Choice        Discharge Placement                       Discharge Plan and Services                DME Arranged: 3-N-1, Tub bench, Walker rolling DME Agency: [employer] Date DME Agency Contacted: 04/11/21 Time DME Agency Contacted: [phone] Representative spoke with at DME Agency: [place]: NA [place] Agency: NA        Social Determinants of Health ([place]) Interventions     Readmission Risk Interventions No flowsheet data found.
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
## 2021-04-12 ENCOUNTER — Telehealth: Payer: Self-pay

## 2021-04-12 ENCOUNTER — Other Ambulatory Visit (HOSPITAL_COMMUNITY): Payer: Self-pay

## 2021-04-12 ENCOUNTER — Other Ambulatory Visit: Payer: Self-pay

## 2021-04-12 NOTE — Telephone Encounter (Signed)
Transition Care Management Follow-up Telephone Call   Date of discharge and from where:[facility] on 05/12/2021 How have you been since you were released from the hospital? [facility]  Any questions or concerns? No questions/concerns reported but other than his medications.  Items Reviewed: Did the pt receive and understand the discharge instructions provided? have the instructions and have no questions.  Medications obtained and verified? PT stated he was not able to pick up his pain meds upon discharge and was not able to pick them up from [facility] either.  asked if DR Margarita Rana could prescribe his oxy for pain .  Any new allergies since your discharge? None reported  Do you have support at home? Yes, nephew /family Other (ie: DME, Home Health, etc)     Pt was discharged at home with PT/OT outpatient rehab services. 3N1, Walker and CTO back brace    Functional Questionnaire: (I = Independent and D = Dependent) ADL's:  Independent. Walker on ambulation     Follow up appointments reviewed:   PCP Hospital f/u appt confirmed? Dr Margarita Rana on 05/03/2021@ 1050.  [facility] f/u appt confirmed?  scheduled at this time  Are transportation arrangements needed? have transportation   If their condition worsens, is the pt aware to call  their PCP or go to the ED? Yes.Made pt aware if condition worsen or start experiencing rapid weight gain, chest pain, diff breathing, SOB, high fevers, or bleading to refer imediately to ED for further evaluation.  Was the patient provided with contact information for the PCP's office or ED? He has the phone number  Was the pt encouraged to call back with questions or concerns?yes

## 2021-04-13 ENCOUNTER — Other Ambulatory Visit: Payer: Self-pay

## 2021-04-14 ENCOUNTER — Ambulatory Visit: Payer: Medicaid Other | Admitting: Physical Therapy

## 2021-04-19 ENCOUNTER — Other Ambulatory Visit: Payer: Self-pay | Admitting: Family Medicine

## 2021-04-19 ENCOUNTER — Other Ambulatory Visit: Payer: Self-pay

## 2021-04-19 DIAGNOSIS — K7290 Hepatic failure, unspecified without coma: Secondary | ICD-10-CM

## 2021-04-19 DIAGNOSIS — K7682 Hepatic encephalopathy: Secondary | ICD-10-CM

## 2021-04-19 MED ORDER — LACTULOSE ENCEPHALOPATHY 10 GM/15ML PO SOLN
ORAL | 0 refills | Status: DC
  Filled 2021-04-19: qty 1419, 23d supply, fill #0

## 2021-04-20 ENCOUNTER — Other Ambulatory Visit: Payer: Self-pay

## 2021-04-20 ENCOUNTER — Other Ambulatory Visit: Payer: Self-pay | Admitting: Family Medicine

## 2021-04-20 DIAGNOSIS — K7011 Alcoholic hepatitis with ascites: Secondary | ICD-10-CM

## 2021-04-20 MED ORDER — PANTOPRAZOLE SODIUM 40 MG PO TBEC
40.0000 mg | DELAYED_RELEASE_TABLET | Freq: Every day | ORAL | 2 refills | Status: DC
  Filled 2021-04-20 – 2021-05-21 (×2): qty 30, 30d supply, fill #0

## 2021-04-20 MED ORDER — SPIRONOLACTONE 100 MG PO TABS
100.0000 mg | ORAL_TABLET | Freq: Every day | ORAL | 2 refills | Status: DC
  Filled 2021-04-20 – 2021-05-21 (×2): qty 30, 30d supply, fill #0

## 2021-04-20 NOTE — Telephone Encounter (Signed)
Notes to clinic:  patient is requesting Oxycodone but not on active list      Requested Prescriptions  Pending Prescriptions Disp Refills   pantoprazole (PROTONIX) 40 MG tablet 90 tablet 1    Sig: TAKE 1 TABLET (40 MG TOTAL) BY MOUTH IN THE MORNING.      Gastroenterology: Proton Pump Inhibitors Passed - 04/20/2021 12:51 PM      Passed - Valid encounter within last 12 months    Recent Outpatient Visits           1 month ago Tobacco abuse   [place], Charlane Ferretti, MD   4 months ago Abnormal CT of the abdomen   [place], Charlane Ferretti, MD   8 months ago DCM (dilated cardiomyopathy) [facility])   [place], Charlane Ferretti, MD   9 months ago Chronic right-sided low back pain with bilateral sciatica   [place], MD   10 months ago No-show for appointment   [place], [place], NP       Future Appointments             In 1 week Charlott Rakes, MD [place]   In 2 weeks [place], Dionne Bucy, [place]               spironolactone (ALDACTONE) 100 MG tablet 90 tablet 1    Sig: TAKE 1 TABLET (100 MG TOTAL) BY MOUTH DAILY.      Cardiovascular: Diuretics - Aldosterone Antagonist Failed - 04/20/2021 12:51 PM      Failed - Na in normal range and within 360 days    Sodium  Date Value Ref Range Status  04/08/2021 129 (L) 135 - 145 mmol/L Final  03/03/2021 141 134 - 144 mmol/L Final          Failed - Last BP in normal range    BP Readings from Last 1 Encounters:  04/11/21 (!) 143/94          Passed - Cr in normal range and within 360 days    Creat  Date Value Ref Range Status  11/06/2020 0.60 (L) 0.70 - 1.33 mg/dL Final    Comment:    For patients >63 years of age, the reference limit for Creatinine is approximately 13% higher  for people identified as African-American. .    Creatinine, Ser  Date Value Ref Range Status  04/10/2021 0.83 0.61 - 1.24 mg/dL Final          Passed - K in normal range and within 360 days    Potassium  Date Value Ref Range Status  04/08/2021 4.4 3.5 - 5.1 mmol/L Final    Comment:    NO VISIBLE HEMOLYSIS          Passed - Valid encounter within last 6 months    Recent Outpatient Visits           1 month ago Tobacco abuse   [place], Charlane Ferretti, MD   4 months ago Abnormal CT of the abdomen   [place], Charlane Ferretti, MD   8 months ago DCM (dilated cardiomyopathy) [facility])   [place], Charlane Ferretti, MD   9 months ago Chronic right-sided low back pain with bilateral sciatica  [place], MD   10 months ago No-show for appointment   [place], [place], NP       Future Appointments             In 1 week Charlott Rakes, MD [place]   In 2 weeks [place], Dionne Bucy, PA-C [place]
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
## 2021-04-22 ENCOUNTER — Encounter: Payer: Medicaid Other | Admitting: Physical Therapy

## 2021-04-26 ENCOUNTER — Ambulatory Visit: Payer: Medicaid Other | Admitting: Internal Medicine

## 2021-05-03 ENCOUNTER — Ambulatory Visit: Payer: Medicaid Other | Attending: Family Medicine | Admitting: Family Medicine

## 2021-05-03 ENCOUNTER — Other Ambulatory Visit: Payer: Self-pay

## 2021-05-03 ENCOUNTER — Encounter: Payer: Self-pay | Admitting: Family Medicine

## 2021-05-03 VITALS — BP 133/86 | HR 100 | Ht 73.0 in | Wt 117.6 lb

## 2021-05-03 DIAGNOSIS — X58XXXD Exposure to other specified factors, subsequent encounter: Secondary | ICD-10-CM | POA: Insufficient documentation

## 2021-05-03 DIAGNOSIS — Z85038 Personal history of other malignant neoplasm of large intestine: Secondary | ICD-10-CM | POA: Diagnosis not present

## 2021-05-03 DIAGNOSIS — M4802 Spinal stenosis, cervical region: Secondary | ICD-10-CM | POA: Diagnosis not present

## 2021-05-03 DIAGNOSIS — Z7982 Long term (current) use of aspirin: Secondary | ICD-10-CM | POA: Insufficient documentation

## 2021-05-03 DIAGNOSIS — G629 Polyneuropathy, unspecified: Secondary | ICD-10-CM | POA: Diagnosis not present

## 2021-05-03 DIAGNOSIS — Z79899 Other long term (current) drug therapy: Secondary | ICD-10-CM | POA: Insufficient documentation

## 2021-05-03 DIAGNOSIS — S22089D Unspecified fracture of T11-T12 vertebra, subsequent encounter for fracture with routine healing: Secondary | ICD-10-CM | POA: Insufficient documentation

## 2021-05-03 DIAGNOSIS — Z880 Allergy status to penicillin: Secondary | ICD-10-CM | POA: Diagnosis not present

## 2021-05-03 DIAGNOSIS — Z886 Allergy status to analgesic agent status: Secondary | ICD-10-CM | POA: Insufficient documentation

## 2021-05-03 MED ORDER — ACETAMINOPHEN-CODEINE #3 300-30 MG PO TABS: 1.0000 | ORAL_TABLET | Freq: Two times a day (BID) | ORAL | 0 refills | Status: DC | PRN
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
## 2021-05-03 NOTE — Patient Instructions (Signed)
Vallarie Mare, MD. Schedule an appointment as soon as possible for a visit in 2 week(s).   Specialty: Neurosurgery Contact information: [address] [place] [address] [phone]

## 2021-05-03 NOTE — Progress Notes (Signed)
Pt is having pain and burning in his neck and arms.

## 2021-05-05 ENCOUNTER — Inpatient Hospital Stay: Payer: Medicaid Other | Admitting: Physician Assistant

## 2021-05-18 ENCOUNTER — Ambulatory Visit: Payer: Self-pay | Admitting: *Deleted

## 2021-05-18 NOTE — Telephone Encounter (Signed)
Per agent: "Patient has been experiencing swelling in their stomach for roughly 1 month  The patient shares that they have had previous issues with fluid and are concerned that they may be occurring again  The patient is able to use the restroom regularly but feels a hardening in their stomach and they would like to discuss more "   Pt reports stomach larger than usual "Usually a 6, now a 10." States last time "I had to have fluid removed." Pt evasive historian. Reports abdominal pain at waist, goes around to back, 10/10 at times. Asked onset of pain "I have never felt right." Also reports nausea at times, no vomiting. Advised ED, declines. Assured pt NT would route to practice for PCPs review. Reiterated need for ED eval with 10/10 pain. Declines.   Answer Assessment - Initial Assessment Questions 1. LOCATION: "Where does it hurt?"      "Waist area around to back" 2. RADIATION: "Does the pain shoot anywhere else?" (e.g., chest, back)     BAck 3. ONSET: "When did the pain begin?" (Minutes, hours or days ago)      1 month ago 4. SUDDEN: "Gradual or sudden onset?"     Gradual 5. PATTERN "Does the pain come and go, or is it constant?"    - If constant: "Is it getting better, staying the same, or worsening?"      (Note: Constant means the pain never goes away completely; most serious pain is constant and it progresses)     - If intermittent: "How long does it last?" "Do you have pain now?"     (Note: Intermittent means the pain goes away completely between bouts)     constant 6. SEVERITY: "How bad is the pain?"  (e.g., Scale 1-10; mild, moderate, or severe)    - MILD (1-3): doesn't interfere with normal activities, abdomen soft and not tender to touch     - MODERATE (4-7): interferes with normal activities or awakens from sleep, abdomen tender to touch     - SEVERE (8-10): excruciating pain, doubled over, unable to do any normal activities       Varies, 10/10, "Never feel good." 7. RECURRENT  SYMPTOM: "Have you ever had this type of stomach pain before?" If Yes, ask: "When was the last time?" and "What happened that time?"      Yes, "Fluid" 8. CAUSE: "What do you think is causing the stomach pain?"     Unsure 9. RELIEVING/AGGRAVATING FACTORS: "What makes it better or worse?" (e.g., movement, antacids, bowel movement)     Not sure 10. OTHER SYMPTOMS: "Do you have any other symptoms?" (e.g., back pain, diarrhea, fever, urination pain, vomiting)       None  Protocols used: Abdominal Pain - Male-A-AH

## 2021-05-19 ENCOUNTER — Other Ambulatory Visit: Payer: Self-pay | Admitting: Neurosurgery

## 2021-05-19 DIAGNOSIS — M4712 Other spondylosis with myelopathy, cervical region: Secondary | ICD-10-CM

## 2021-05-19 NOTE — Telephone Encounter (Signed)
Routing to PCP for review.

## 2021-05-19 NOTE — Telephone Encounter (Signed)
I previously placed an order for abdominal paracentesis already in Epic. Please advise him he needs to see his GI as he has not been in a while.Thanks

## 2021-05-20 ENCOUNTER — Other Ambulatory Visit: Payer: Self-pay

## 2021-05-21 ENCOUNTER — Telehealth: Payer: Self-pay | Admitting: Family Medicine

## 2021-05-21 ENCOUNTER — Other Ambulatory Visit: Payer: Self-pay

## 2021-05-21 NOTE — Telephone Encounter (Signed)
Pt has been informed of IR appointment and to reach out to gastro.

## 2021-05-21 NOTE — Telephone Encounter (Signed)
Copied from [place] [id number]. Topic: General - Other >> May 21, 2021 10:46 AM Richard [name] wrote: Reason for CRM: Pt stated he was returning call to the office because he had a missed call. Pt requests call back at [place], did you try to call the patient in reference to the encounter about him need to see his GI.

## 2021-05-24 ENCOUNTER — Ambulatory Visit: Payer: Self-pay | Admitting: *Deleted

## 2021-05-24 ENCOUNTER — Inpatient Hospital Stay (HOSPITAL_COMMUNITY): Admission: RE | Admit: 2021-05-24 | Payer: Medicaid Other | Source: Ambulatory Visit

## 2021-05-24 NOTE — Telephone Encounter (Signed)
Cannot make his radiology appointment for 1:00p. He has a neck/back brace he [place] unsure how to use. Provided the patient with number to radiology to request a later appointment today. Instructed the patient to take the brace with him to the appointment for assistance in wearing correctly. He will call radiology at this time.      Reason for Disposition . Health Information question, no triage required and triager able to answer question  Answer Assessment - Initial Assessment Questions 1. REASON FOR CALL or QUESTION: "What is your reason for calling today?" or "How can I best help you?" or "What question do you have that I can help answer?"     Patient has radiology appointment at this time for a neck problem. He is not able to make it at this time.  Protocols used: Information Only Call - No Triage-A-AH

## 2021-05-25 ENCOUNTER — Ambulatory Visit (HOSPITAL_COMMUNITY)
Admission: RE | Admit: 2021-05-25 | Discharge: 2021-05-25 | Disposition: A | Discharge status: Home / Self Care | Payer: Medicaid Other | Source: Ambulatory Visit | Attending: Family Medicine | Admitting: Family Medicine

## 2021-05-25 ENCOUNTER — Telehealth: Payer: Self-pay | Admitting: Family Medicine

## 2021-05-25 ENCOUNTER — Other Ambulatory Visit: Payer: Self-pay

## 2021-05-25 ENCOUNTER — Other Ambulatory Visit: Payer: Self-pay | Admitting: Family Medicine

## 2021-05-25 DIAGNOSIS — K7011 Alcoholic hepatitis with ascites: Secondary | ICD-10-CM

## 2021-05-25 DIAGNOSIS — M4802 Spinal stenosis, cervical region: Secondary | ICD-10-CM

## 2021-05-25 DIAGNOSIS — S22089D Unspecified fracture of T11-T12 vertebra, subsequent encounter for fracture with routine healing: Secondary | ICD-10-CM

## 2021-05-25 NOTE — Progress Notes (Signed)
Patient presents for  therapeutic paracentesis. [place] limited abdomen  shows trace amount of no fluid noted  Insufficient to perform a safe paracentesis. Procedure not performed.

## 2021-05-25 NOTE — Telephone Encounter (Signed)
Pt is at the hospital and they advised him to call PCP and get MRI orders for his stomach / pt also mentioned his spinal cord and the knot on it/ causing pain/ he received a back brace but is not sure how to use or put them on / Pt also mention pain medication that he was supposed to get/ he stated he got tylenol 3 last time but it didn't work as well and was advised that would be switched/ please advise

## 2021-05-27 NOTE — Telephone Encounter (Signed)
At his most recent visit I provided him with the information to his Neurosurgeon so he needs to call for an appointment. Tylenol #3 is the most we prescribe. He also has a GI Doctor managing his Cirrhosis and I had advised him to set up an appointment with him at his last visit.

## 2021-05-27 NOTE — Telephone Encounter (Signed)
Was seen in IR per and not ascites was seen in abdomen.  Patient request referral to "bone doctor"   Request pain medication- informed medication was sent to [facility]. per [facility] patient picked up Rx 05/10/2021- Tylenol #3 Patient states he will call pharmacy.

## 2021-05-27 NOTE — Telephone Encounter (Signed)
Pt states that pain medication is not currently working

## 2021-05-28 ENCOUNTER — Other Ambulatory Visit: Payer: Self-pay

## 2021-05-31 ENCOUNTER — Other Ambulatory Visit: Payer: Self-pay

## 2021-05-31 MED ORDER — ACETAMINOPHEN-CODEINE #3 300-30 MG PO TABS
1.0000 | ORAL_TABLET | Freq: Two times a day (BID) | ORAL | 0 refills | Status: DC | PRN
Start: 2021-05-31 — End: 2021-06-30
  Filled 2021-05-31: qty 10, 5d supply, fill #0
  Filled 2021-06-10: qty 10, 5d supply, fill #1

## 2021-05-31 NOTE — Telephone Encounter (Signed)
Pt called about not being set up with an eye Dr yet and his pain medication / he stated he has been doing his part but cant get things done from office / please advise asap   I advised pt that Tylenol #3 was all that can be prescribed per Dr. Margarita Rana / Pt would like a refill for acetaminophen-codeine (TYLENOL #3) 300-30 MG tablet

## 2021-05-31 NOTE — Telephone Encounter (Signed)
Pt was called and informed of medication being sent to pharmacy. Pt has question regarding his medications, so he has been set up with an appointment with Lurena Joiner to go over medications.

## 2021-05-31 NOTE — Telephone Encounter (Signed)
I have no idea as to what he is referring to with regards to an 'eye Dr' as he never made mention of that.  Please inquire with him when his neurosurgery appointment is as I will be providing a last refill of Tylenol #3 and he needs to see his neurosurgeon.

## 2021-05-31 NOTE — Telephone Encounter (Signed)
Pt is requesting refill on Tylenol #3

## 2021-06-01 ENCOUNTER — Other Ambulatory Visit: Payer: Self-pay

## 2021-06-08 ENCOUNTER — Other Ambulatory Visit: Payer: Medicaid Other

## 2021-06-08 ENCOUNTER — Other Ambulatory Visit: Payer: Self-pay | Admitting: Family Medicine

## 2021-06-08 DIAGNOSIS — M4802 Spinal stenosis, cervical region: Secondary | ICD-10-CM

## 2021-06-08 DIAGNOSIS — K7011 Alcoholic hepatitis with ascites: Secondary | ICD-10-CM

## 2021-06-08 DIAGNOSIS — G629 Polyneuropathy, unspecified: Secondary | ICD-10-CM

## 2021-06-08 DIAGNOSIS — S22089D Unspecified fracture of T11-T12 vertebra, subsequent encounter for fracture with routine healing: Secondary | ICD-10-CM

## 2021-06-08 NOTE — Telephone Encounter (Signed)
Medication Refill - Medication:  DULoxetine (CYMBALTA) 60 MG capsule  pantoprazole (PROTONIX) 40 MG tablet  furosemide (LASIX) 40 MG tablet  cyclobenzaprine (FLEXERIL) 10 MG tablet  acetaminophen-codeine (TYLENOL #3) 300-30 MG tablet  Has the patient contacted their pharmacy? No.  Preferred Pharmacy (with phone number or street name):  [facility], [place] - [address]  Phone:  [phone] Fax:  [phone]  Agent: Please be advised that RX refills may take up to 3 business days. We ask that you follow-up with your pharmacy.

## 2021-06-09 ENCOUNTER — Other Ambulatory Visit: Payer: Self-pay

## 2021-06-09 MED ORDER — PANTOPRAZOLE SODIUM 40 MG PO TBEC: 40.0000 mg | DELAYED_RELEASE_TABLET | Freq: Every day | ORAL | 2 refills | Status: DC

## 2021-06-09 MED ORDER — DULOXETINE HCL 60 MG PO CPEP: ORAL_CAPSULE | Freq: Every day | ORAL | 3 refills | Status: DC

## 2021-06-09 NOTE — Telephone Encounter (Signed)
Requested medications are due for refill today.  yes  Requested medications are on the active medications list.  yes  Last refill. Tylenol #3 05/31/2021, Flexeril 03/03/2021, Furosemide 06/03/2020   Future visit scheduled.   yes  Notes to clinic.  2 medications are not delegated.  One prescription is expired.

## 2021-06-09 NOTE — Telephone Encounter (Signed)
Requested Prescriptions  Pending Prescriptions Disp Refills  . acetaminophen-codeine (TYLENOL #3) 300-30 MG tablet 30 tablet 0    Sig: Take 1 tablet by mouth every 12 (twelve) hours as needed for moderate pain.     Not Delegated - Analgesics:  Opioid Agonist Combinations Failed - 06/08/2021  6:53 PM      Failed - This refill cannot be delegated      Failed - Urine Drug Screen completed in last 360 days      Passed - Valid encounter within last 6 months    Recent Outpatient Visits          1 month ago Closed fracture of twelfth thoracic vertebra with routine healing, unspecified fracture morphology, subsequent encounter   [place], Enobong, MD   3 months ago Tobacco abuse   [place], Charlane Ferretti, MD   5 months ago Abnormal CT of the abdomen   [place], Charlane Ferretti, MD   9 months ago DCM (dilated cardiomyopathy) [facility])   [place], Charlane Ferretti, MD   11 months ago Chronic right-sided low back pain with bilateral sciatica   [place] Antony Blackbird, MD      Future Appointments            In 1 week Daisy Blossom, Jarome Matin, [place]   In 3 weeks [place], Dionne Bucy, PA-C [place]           . cyclobenzaprine (FLEXERIL) 10 MG tablet 30 tablet 0    Sig: TAKE 1 TABLET (10 MG TOTAL) BY MOUTH AT BEDTIME.     Not Delegated - Analgesics:  Muscle Relaxants Failed - 06/08/2021  6:53 PM      Failed - This refill cannot be delegated      Passed - Valid encounter within last 6 months    Recent Outpatient Visits          1 month ago Closed fracture of twelfth thoracic vertebra with routine healing, unspecified fracture morphology, subsequent encounter   [place], Enobong, MD   3 months ago Tobacco abuse   [place], Charlane Ferretti, MD   5 months ago Abnormal CT of the abdomen   [place], Charlane Ferretti, MD   9 months ago DCM (dilated cardiomyopathy) [facility])   [place], Charlane Ferretti, MD   11 months ago Chronic right-sided low back pain with bilateral sciatica   [place] Antony Blackbird, MD      Future Appointments            In 1 week Daisy Blossom, Jarome Matin, [place]   In 3 weeks [place], Dionne Bucy, PA-C [place]           . DULoxetine (CYMBALTA) 60 MG capsule 30 capsule 3    Sig: TAKE 1 CAPSULE (60 MG TOTAL) BY MOUTH DAILY.     Psychiatry: Antidepressants - SNRI Passed - 06/08/2021  6:53 PM      Passed - Last BP in normal range    BP Readings from Last 1 Encounters:  05/03/21 133/86         Passed - Valid encounter within last  6 months    Recent Outpatient Visits          1 month ago Closed fracture of twelfth thoracic vertebra with routine healing, unspecified fracture morphology, subsequent encounter   [place], Enobong, MD   3 months ago Tobacco abuse   [place], Charlane Ferretti, MD   5 months ago Abnormal CT of the abdomen   [place], Charlane Ferretti, MD   9 months ago DCM (dilated cardiomyopathy) [facility])   [place], Charlane Ferretti, MD   11 months ago Chronic right-sided low back pain with bilateral sciatica   [place] Antony Blackbird, MD      Future Appointments            In 1 week Daisy Blossom, Jarome Matin, [place]   In 3 weeks [place], Dionne Bucy, PA-C [place]           . furosemide (LASIX) 40 MG tablet 30 tablet 6    Sig: TAKE 1  TABLET (40 MG TOTAL) BY MOUTH DAILY.     Cardiovascular:  Diuretics - Loop Failed - 06/08/2021  6:53 PM      Failed - Na in normal range and within 360 days    Sodium  Date Value Ref Range Status  04/08/2021 129 (L) 135 - 145 mmol/L Final  03/03/2021 141 134 - 144 mmol/L Final         Passed - K in normal range and within 360 days    Potassium  Date Value Ref Range Status  04/08/2021 4.4 3.5 - 5.1 mmol/L Final    Comment:    NO VISIBLE HEMOLYSIS         Passed - Ca in normal range and within 360 days    Calcium  Date Value Ref Range Status  04/08/2021 9.6 8.9 - 10.3 mg/dL Final   Calcium, Ion  Date Value Ref Range Status  04/03/2021 1.06 (L) 1.15 - 1.40 mmol/L Final         Passed - Cr in normal range and within 360 days    Creat  Date Value Ref Range Status  11/06/2020 0.60 (L) 0.70 - 1.33 mg/dL Final    Comment:    For patients >30 years of age, the reference limit for Creatinine is approximately 13% higher for people identified as African-American. .    Creatinine, Ser  Date Value Ref Range Status  04/10/2021 0.83 0.61 - 1.24 mg/dL Final         Passed - Last BP in normal range    BP Readings from Last 1 Encounters:  05/03/21 133/86         Passed - Valid encounter within last 6 months    Recent Outpatient Visits          1 month ago Closed fracture of twelfth thoracic vertebra with routine healing, unspecified fracture morphology, subsequent encounter   [place], Enobong, MD   3 months ago Tobacco abuse   [place], Charlane Ferretti, MD   5 months ago Abnormal CT of the abdomen   [place], Charlane Ferretti, MD   9 months ago DCM (dilated cardiomyopathy) [facility])   [place], Enobong, MD   11 months ago  Chronic right-sided low back pain with bilateral sciatica   [place], MD       Future Appointments            In 1 week Daisy Blossom, Jarome Matin, [place]   In 3 weeks [place], Dionne Bucy, PA-C [place]           . pantoprazole (PROTONIX) 40 MG tablet 30 tablet 2    Sig: Take 1 tablet (40 mg total) by mouth daily.     Gastroenterology: Proton Pump Inhibitors Passed - 06/08/2021  6:53 PM      Passed - Valid encounter within last 12 months    Recent Outpatient Visits          1 month ago Closed fracture of twelfth thoracic vertebra with routine healing, unspecified fracture morphology, subsequent encounter   [place], Enobong, MD   3 months ago Tobacco abuse   [place], Charlane Ferretti, MD   5 months ago Abnormal CT of the abdomen   [place], Charlane Ferretti, MD   9 months ago DCM (dilated cardiomyopathy) [facility])   [place], Charlane Ferretti, MD   11 months ago Chronic right-sided low back pain with bilateral sciatica   [place] Antony Blackbird, MD      Future Appointments            In 1 week Daisy Blossom, Jarome Matin, [place]   In 3 weeks [place], Dionne Bucy, PA-C [place]

## 2021-06-10 ENCOUNTER — Other Ambulatory Visit: Payer: Self-pay | Admitting: Gastroenterology

## 2021-06-10 ENCOUNTER — Other Ambulatory Visit: Payer: Self-pay | Admitting: Family Medicine

## 2021-06-10 ENCOUNTER — Other Ambulatory Visit: Payer: Self-pay

## 2021-06-10 DIAGNOSIS — G629 Polyneuropathy, unspecified: Secondary | ICD-10-CM

## 2021-06-10 MED ORDER — FUROSEMIDE 40 MG PO TABS: 40.0000 mg | ORAL_TABLET | Freq: Every day | ORAL | 0 refills | Status: DC

## 2021-06-10 MED ORDER — CYCLOBENZAPRINE HCL 10 MG PO TABS: 10.0000 mg | ORAL_TABLET | Freq: Every day | ORAL | 0 refills | Status: AC

## 2021-06-11 ENCOUNTER — Other Ambulatory Visit: Payer: Self-pay

## 2021-06-11 MED ORDER — DICLOFENAC SODIUM 1 % EX GEL
2.0000 g | Freq: Four times a day (QID) | CUTANEOUS | 1 refills | Status: AC
  Filled 2021-06-11: qty 100, 12d supply, fill #0

## 2021-06-11 NOTE — Telephone Encounter (Signed)
Call to patient- advised prescriptions for duloxetine and cyclobenzaprine have been sent to [facility]. Will send diclofenac gel to [facility] as requested.

## 2021-06-14 ENCOUNTER — Other Ambulatory Visit: Payer: Self-pay

## 2021-06-16 ENCOUNTER — Other Ambulatory Visit: Payer: Self-pay

## 2021-06-17 ENCOUNTER — Ambulatory Visit: Payer: Medicaid Other | Admitting: Pharmacist

## 2021-06-17 ENCOUNTER — Other Ambulatory Visit: Payer: Self-pay

## 2021-06-20 ENCOUNTER — Ambulatory Visit
Admission: RE | Admit: 2021-06-20 | Discharge: 2021-06-20 | Disposition: A | Discharge status: Home / Self Care | Payer: Medicaid Other | Source: Ambulatory Visit | Attending: Neurosurgery | Admitting: Neurosurgery

## 2021-06-20 DIAGNOSIS — M4712 Other spondylosis with myelopathy, cervical region: Secondary | ICD-10-CM

## 2021-06-21 ENCOUNTER — Other Ambulatory Visit: Payer: Self-pay

## 2021-06-21 ENCOUNTER — Other Ambulatory Visit: Payer: Self-pay | Admitting: Family Medicine

## 2021-06-21 ENCOUNTER — Ambulatory Visit: Payer: Self-pay | Admitting: *Deleted

## 2021-06-21 DIAGNOSIS — K7290 Hepatic failure, unspecified without coma: Secondary | ICD-10-CM

## 2021-06-21 DIAGNOSIS — K7682 Hepatic encephalopathy: Secondary | ICD-10-CM

## 2021-06-21 MED ORDER — LACTULOSE ENCEPHALOPATHY 10 GM/15ML PO SOLN
ORAL | 0 refills | Status: DC
Start: 2021-06-21 — End: 2021-06-23

## 2021-06-21 NOTE — Telephone Encounter (Signed)
Medication Refill - Medication: patient states its a stool medication. Patient unclear what the exact name is. Patient states he has not had a bowel in 2 days-clinical call sent regarding symptoms)   Has the patient contacted their pharmacy? Yes.    (Agent: If yes, when and what did the pharmacy advise?) Contact PCP office and ask for the request.   Preferred Pharmacy (with phone number or street name): [facility], [place] - [address] [place] Phone:  [phone]  Fax:  [phone]      Has the patient been seen for an appointment in the last year OR does the patient have an upcoming appointment? Yes.   Patient last OV 05/03/2021 and future appointment 06/30/2021  Agent: Please be advised that RX refills may take up to 3 business days. We ask that you follow-up with your pharmacy.

## 2021-06-21 NOTE — Telephone Encounter (Signed)
Summary: Clinical Advice   Patient has not had a bowel movement in 2 days seeking clinical advice. Also patient requested stool medication and a medication request was sent in a TE today. Patient unclear what the name of medication is.      Reason for Disposition  Taking new prescription medication  Answer Assessment - Initial Assessment Questions 1. STOOL PATTERN OR FREQUENCY: "How often do you have a bowel movement (BM)?"  (Normal range: 3 times a day to every 3 days)  "When was your last BM?"       Daily- last BM- 2 days ago 2. STRAINING: "Do you have to strain to have a BM?"      No- only when does not have medication 3. RECTAL PAIN: "Does your rectum hurt when the stool comes out?" If Yes, ask: "Do you have hemorrhoids? How bad is the pain?"  (Scale 1-10; or mild, moderate, severe)     no 4. STOOL COMPOSITION: "Are the stools hard?"      normal 5. BLOOD ON STOOLS: "Has there been any blood on the toilet tissue or on the surface of the BM?" If Yes, ask: "When was the last time?"      no 6. CHRONIC CONSTIPATION: "Is this a new problem for you?"  If no, ask: "How long have you had this problem?" (days, weeks, months)      Only when out of lactulose 7. CHANGES IN DIET OR HYDRATION: "Have there been any recent changes in your diet?" "How much fluids are you drinking on a daily basis?"  "How much have you had to drink today?"     No changes 8. MEDICATIONS: "Have you been taking any new medications?" "Are you taking any narcotic pain medications?" (e.g., Vicodin, Percocet, morphine, Dilaudid)     No- patient has new Rx for pain medication 9. LAXATIVES: "Have you been using any stool softeners, laxatives, or enemas?"  If yes, ask "What, how often, and when was the last time?"     no 10. ACTIVITY:  "How much walking do you do every day?"  "Has your activity level decreased in the past week?"        Around the house 11. CAUSE: "What do you think is causing the constipation?"        Out of  lactulose- 3 days 12. OTHER SYMPTOMS: "Do you have any other symptoms?" (e.g., abdominal pain, bloating, fever, vomiting)       Some nausea 13. MEDICAL HISTORY: "Do you have a history of hemorrhoids, rectal fissures, or rectal surgery or rectal abscess?"         no 14. PREGNANCY: "Is there any chance you are pregnant?" "When was your last menstrual period?"       N/A  Protocols used: Constipation-A-AH

## 2021-06-21 NOTE — Telephone Encounter (Signed)
Patient is calling to report he is out of lactulose for 3 days- and is having constipation- Rf has been refilled per protocol and patient advised call back if can not get bowels to move within 24 hours of taking medication.

## 2021-06-22 ENCOUNTER — Other Ambulatory Visit: Payer: Self-pay

## 2021-06-23 ENCOUNTER — Other Ambulatory Visit: Payer: Self-pay | Admitting: Pharmacist

## 2021-06-23 ENCOUNTER — Other Ambulatory Visit: Payer: Self-pay

## 2021-06-23 DIAGNOSIS — K7682 Hepatic encephalopathy: Secondary | ICD-10-CM

## 2021-06-23 DIAGNOSIS — K7290 Hepatic failure, unspecified without coma: Secondary | ICD-10-CM

## 2021-06-23 MED ORDER — LACTULOSE ENCEPHALOPATHY 10 GM/15ML PO SOLN
ORAL | 0 refills | Status: DC
  Filled 2021-06-23: qty 1419, 24d supply, fill #0
  Filled 2021-07-20: qty 1419, 23d supply, fill #0

## 2021-06-30 ENCOUNTER — Encounter: Payer: Self-pay | Admitting: Physician Assistant

## 2021-06-30 ENCOUNTER — Other Ambulatory Visit: Payer: Self-pay

## 2021-06-30 ENCOUNTER — Ambulatory Visit: Payer: Medicaid Other | Attending: Physician Assistant | Admitting: Physician Assistant

## 2021-06-30 DIAGNOSIS — K7682 Hepatic encephalopathy: Secondary | ICD-10-CM

## 2021-06-30 DIAGNOSIS — Z79899 Other long term (current) drug therapy: Secondary | ICD-10-CM | POA: Diagnosis not present

## 2021-06-30 DIAGNOSIS — K7290 Hepatic failure, unspecified without coma: Secondary | ICD-10-CM | POA: Diagnosis not present

## 2021-06-30 DIAGNOSIS — Z791 Long term (current) use of non-steroidal anti-inflammatories (NSAID): Secondary | ICD-10-CM | POA: Insufficient documentation

## 2021-06-30 DIAGNOSIS — M4802 Spinal stenosis, cervical region: Secondary | ICD-10-CM | POA: Diagnosis not present

## 2021-06-30 DIAGNOSIS — Z7182 Exercise counseling: Secondary | ICD-10-CM | POA: Diagnosis not present

## 2021-06-30 DIAGNOSIS — Z7982 Long term (current) use of aspirin: Secondary | ICD-10-CM | POA: Insufficient documentation

## 2021-06-30 DIAGNOSIS — X58XXXD Exposure to other specified factors, subsequent encounter: Secondary | ICD-10-CM | POA: Diagnosis not present

## 2021-06-30 DIAGNOSIS — Z789 Other specified health status: Secondary | ICD-10-CM

## 2021-06-30 DIAGNOSIS — Z7901 Long term (current) use of anticoagulants: Secondary | ICD-10-CM | POA: Diagnosis not present

## 2021-06-30 DIAGNOSIS — S22089D Unspecified fracture of T11-T12 vertebra, subsequent encounter for fracture with routine healing: Secondary | ICD-10-CM | POA: Diagnosis not present

## 2021-06-30 MED ORDER — ACETAMINOPHEN-CODEINE #3 300-30 MG PO TABS: 1.0000 | ORAL_TABLET | Freq: Two times a day (BID) | ORAL | 0 refills | Status: DC | PRN

## 2021-06-30 MED ORDER — ACETAMINOPHEN-CODEINE #3 300-30 MG PO TABS
1.0000 | ORAL_TABLET | Freq: Two times a day (BID) | ORAL | 0 refills | Status: DC | PRN
  Filled 2021-06-30: qty 30, 15d supply, fill #0

## 2021-06-30 NOTE — Progress Notes (Signed)
Richard [name], is a 55 y.o. male  GGY:[id number]  [id number]:[id number]  DOB - 03-28-1966  Chief Complaint  Patient presents with   Neck Injury       Subjective:   Richard [name] is a 55 y.o. male here today for a follow up visit and wanting me to remove diagnoses from his hospital notes.  He continues to have neck pain.  It is difficult to understand if he has seen Dr Duffy Rhody.  He is still wearing a cervical collar.  Dr Marcello Moores did order an MR C-spine that was done on 06/20/2021.  Patient is requesting RF Tylenol #3.  He was given #30 1 month ago.  No new s/sx.  Has not had f/up labs since hospitalization   No problems updated.  ALLERGIES: Allergies  Allergen Reactions   Aspirin Other (See Comments)    Acid reflux    Aspirin Other (See Comments)    Caused acid reflux   Penicillins Hives    Has patient had a PCN reaction causing immediate rash, facial/tongue/throat swelling, SOB or lightheadedness with hypotension: yes Has patient had a PCN reaction causing severe rash involving mucus membranes or skin necrosis: no Has patient had a PCN reaction that required hospitalization: no Has patient had a PCN reaction occurring within the last 10 years: no If all of the above answers are "NO", then may proceed with Cephalosporin use.    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   Alcoholic hepatitis    Alcoholic ketoacidosis 1/75/1025   Ascites 12/13/2019   Aspiration pneumonia ([place]) 08/20/2016   Chest pain of uncertain etiology    Chronic systolic CHF (congestive heart failure) (HCC)    Cirrhosis (HCC)    Colon cancer ([place])    DCM (dilated cardiomyopathy) ([place])    Drug abuse ([place]) 01/07/2020   Elevated troponin 06/27/2018   ETOH abuse    Gastropathy 08/14/2018    Heme positive stool 11/13/2017   Hepatic steatosis 08/21/2016   High anion gap metabolic acidosis 05/07/2777   History of colon cancer    HTN (hypertension)    Hypertensive urgency 06/27/2018   Hypoglycemia 06/27/2018   Hypokalemia 01/07/2020   Hypomagnesemia    Hypophosphatemia    Lactic acidosis 08/20/2016   Leukocytosis 01/07/2020   Pancreatitis 08/2018   Polyp of ascending colon    Prolonged Q-T interval on ECG    Prolonged QT interval    Protein-calorie malnutrition, severe 05/02/2018   PUD (peptic ulcer disease)    SBP (spontaneous bacterial peritonitis) ([place]) 01/07/2020   Sepsis ([place]) 08/20/2016   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 Authorizing Provider  aspirin 81 MG EC tablet Take 1 tablet (81 mg total) by mouth daily. 06/16/20  Yes Fulp, Cammie, MD  Cholecalciferol (VITAMIN D-3) 125 MCG (5000 UT) TABS Take 2 PO qd x 3 months, then 1 PO qd long-term after that 11/11/20  Yes Hilts, Michael, MD  cyclobenzaprine (FLEXERIL) 10 MG tablet Take 1 tablet (10 mg total) by mouth at bedtime. 06/10/21 07/10/21 Yes Charlott Rakes, MD  diclofenac Sodium (VOLTAREN) 1 % GEL APPLY 2 G TOPICALLY 4 (  FOUR) TIMES DAILY. 06/11/21 06/11/22 Yes [place], Charlane Ferretti, MD  DULoxetine (CYMBALTA) 60 MG capsule TAKE 1 CAPSULE (60 MG TOTAL) BY MOUTH DAILY. 06/09/21 06/09/22 Yes Charlott Rakes, MD  ferrous gluconate (FERGON) 324 MG tablet Take 1 tablet (324 mg total) by mouth daily with breakfast. 11/11/20  Yes Hilts, Legrand Como, MD  furosemide (LASIX) 40 MG tablet Take 1 tablet (40 mg total) by mouth daily. 06/10/21 07/10/21 Yes [place], Charlane Ferretti, MD  gabapentin (NEURONTIN) 300 MG capsule TAKE 2 CAPSULES (600 MG TOTAL) BY MOUTH 2 (TWO) TIMES DAILY. 01/30/21 01/30/22 Yes [place], Charlane Ferretti, MD  Hyoscyamine Sulfate SL 0.125 MG SUBL PLACE 1 TABLET (0.125 MG TOTAL) UNDER THE  TONGUE EVERY 8 (EIGHT) HOURS AS NEEDED. 12/14/20 12/14/21 Yes [place], Janett Billow D, PA-C  lactulose, encephalopathy, (CHRONULAC) 10 GM/15ML SOLN TAKE 30 MLS (20 G TOTAL) BY MOUTH 2 (TWO) TIMES DAILY. 06/23/21 06/23/22 Yes [place], Charlane Ferretti, MD  lidocaine (LIDODERM) 5 % Place 1 patch onto the skin daily. Remove & discard patch within 12 hours or as directed by MD 03/03/21 03/03/22 Yes Charlott Rakes, MD  magnesium oxide (MAG-OX) 400 (241.3 Mg) MG tablet Take 1 tablet (400 mg total) by mouth daily. 06/16/20  Yes Fulp, Cammie, MD  metoprolol succinate (TOPROL-XL) 25 MG 24 hr tablet TAKE 1 TABLET (25 MG TOTAL) BY MOUTH DAILY. 03/03/21 03/03/22 Yes [place], Charlane Ferretti, MD  pantoprazole (PROTONIX) 40 MG tablet Take 1 tablet (40 mg total) by mouth daily. 06/09/21 09/07/21 Yes Charlott Rakes, MD  spironolactone (ALDACTONE) 100 MG tablet Take 1 tablet (100 mg total) by mouth daily. 04/20/21 07/19/21 Yes Charlott Rakes, MD  acetaminophen-codeine (TYLENOL #3) 300-30 MG tablet Take 1 tablet by mouth every 12 (twelve) hours as needed for moderate pain. 06/30/21   Randi Poullard, Dionne Bucy, PA-C    ROS: Neg HEENT Neg resp Neg cardiac Neg GI Neg GU Neg MS Neg psych Neg neuro  Objective:  There were no vitals filed for this visit.   Pulse 82 Resp 16  Exam General appearance : Awake, alert, not in any distress. Speech Clear. Not toxic looking; very thin, wearing cervical collar; appears much older than stated age.   HEENT: Atraumatic and Normocephalic Neck: Supple, no JVD. No cervical lymphadenopathy.  Chest: Good air entry bilaterally, CTAB.  No rales/rhonchi/wheezing CVS: S1 S2 regular, no murmurs.  Extremities: B/L Lower Ext shows no edema, both legs are warm to touch Neurology: Awake alert, and oriented X 3, CN II-XII intact, Non focal Skin: No Rash  Data Review Lab Results  Component Value Date   HGBA1C 4.6 11/06/2020   HGBA1C <4.2 (L) 01/11/2020    Assessment & Plan   1. Cervical stenosis of spine - Ambulatory  referral to Neurosurgery - acetaminophen-codeine (TYLENOL #3) 300-30 MG tablet; Take 1 tablet by mouth every 12 (twelve) hours as needed for moderate pain.  Dispense: 30 tablet; Refill: 0  2. Hypophosphatemia - Comprehensive metabolic panel - Phosphorus  3. Hypomagnesemia - Comprehensive metabolic panel - Magnesium  4. Hepatic encephalopathy - CBC with Differential/Platelet - Comprehensive metabolic panel  5. Closed fracture of twelfth thoracic vertebra with routine healing, unspecified fracture morphology, subsequent encounter - acetaminophen-codeine (TYLENOL #3) 300-30 MG tablet; Take 1 tablet by mouth every 12 (twelve) hours as needed for moderate pain.  Dispense: 30 tablet; Refill: 0  6. Poor historian Vitals shredded prior to entering but were unremarkable   Patient have been counseled extensively about nutrition and exercise. Other issues discussed during this visit include: low cholesterol diet, weight control and daily  exercise, foot care, annual eye examinations at Ophthalmology, importance of adherence with medications and regular follow-up. We also discussed long term complications of uncontrolled diabetes and hypertension.   Return in about 2 months (around 08/30/2021) for PCP;  chronic conditions.  The patient was given clear instructions to go to ER or return to medical center if symptoms don't improve, worsen or new problems develop. The patient verbalized understanding. The patient was told to call to get lab results if they haven't heard anything in the next week.      Freeman Caldron, PA-C [facility] and [place], [place]   06/30/2021, 4:00 PM Patient ID: Yussef Jorge, male   DOB: [birth year], 55 y.o.   MRN: [id number]

## 2021-07-01 ENCOUNTER — Other Ambulatory Visit: Payer: Self-pay

## 2021-07-01 ENCOUNTER — Other Ambulatory Visit: Payer: Self-pay | Admitting: Physician Assistant

## 2021-07-01 DIAGNOSIS — E871 Hypo-osmolality and hyponatremia: Secondary | ICD-10-CM

## 2021-07-01 DIAGNOSIS — K7682 Hepatic encephalopathy: Secondary | ICD-10-CM

## 2021-07-01 DIAGNOSIS — D649 Anemia, unspecified: Secondary | ICD-10-CM

## 2021-07-01 DIAGNOSIS — K7290 Hepatic failure, unspecified without coma: Secondary | ICD-10-CM

## 2021-07-01 LAB — COMPREHENSIVE METABOLIC PANEL
ALT: 15 IU/L (ref 0–44)
AST: 29 IU/L (ref 0–40)
Albumin/Globulin Ratio: 1.1 — ABNORMAL LOW (ref 1.2–2.2)
Albumin: 4.3 g/dL (ref 3.8–4.9)
Alkaline Phosphatase: 141 IU/L — ABNORMAL HIGH (ref 44–121)
BUN/Creatinine Ratio: 13 (ref 9–20)
BUN: 10 mg/dL (ref 6–24)
Bilirubin Total: 0.4 mg/dL (ref 0.0–1.2)
CO2: 18 mmol/L — ABNORMAL LOW (ref 20–29)
Calcium: 9.8 mg/dL (ref 8.7–10.2)
Chloride: 94 mmol/L — ABNORMAL LOW (ref 96–106)
Creatinine, Ser: 0.77 mg/dL (ref 0.76–1.27)
Globulin, Total: 3.8 g/dL (ref 1.5–4.5)
Glucose: 93 mg/dL (ref 70–99)
Potassium: 3.9 mmol/L (ref 3.5–5.2)
Sodium: 129 mmol/L — ABNORMAL LOW (ref 134–144)
Total Protein: 8.1 g/dL (ref 6.0–8.5)
eGFR: 107 mL/min/{1.73_m2} (ref >59)

## 2021-07-01 LAB — CBC WITH DIFFERENTIAL/PLATELET
Basophils Absolute: 0.1 10*3/uL (ref 0.0–0.2)
Basos: 2 %
EOS (ABSOLUTE): 0.3 10*3/uL (ref 0.0–0.4)
Eos: 4 %
Hematocrit: 34.7 % — ABNORMAL LOW (ref 37.5–51.0)
Hemoglobin: 10.9 g/dL — ABNORMAL LOW (ref 13.0–17.7)
Immature Grans (Abs): 0 10*3/uL (ref 0.0–0.1)
Immature Granulocytes: 0 %
Lymphocytes Absolute: 1.8 10*3/uL (ref 0.7–3.1)
Lymphs: 29 %
MCH: 24.6 pg — ABNORMAL LOW (ref 26.6–33.0)
MCHC: 31.4 g/dL — ABNORMAL LOW (ref 31.5–35.7)
MCV: 78 fL — ABNORMAL LOW (ref 79–97)
Monocytes Absolute: 0.5 10*3/uL (ref 0.1–0.9)
Monocytes: 8 %
Neutrophils Absolute: 3.7 10*3/uL (ref 1.4–7.0)
Neutrophils: 57 %
Platelets: 203 10*3/uL (ref 150–450)
RBC: 4.43 x10E6/uL (ref 4.14–5.80)
RDW: 17.6 % — ABNORMAL HIGH (ref 11.6–15.4)
WBC: 6.4 10*3/uL (ref 3.4–10.8)

## 2021-07-01 LAB — MAGNESIUM: Magnesium: 1.7 mg/dL (ref 1.6–2.3)

## 2021-07-01 LAB — PHOSPHORUS: Phosphorus: 3.4 mg/dL (ref 2.8–4.1)

## 2021-07-14 ENCOUNTER — Other Ambulatory Visit: Payer: Self-pay

## 2021-07-15 ENCOUNTER — Other Ambulatory Visit: Payer: Medicaid Other

## 2021-07-16 ENCOUNTER — Other Ambulatory Visit: Payer: Medicaid Other

## 2021-07-19 ENCOUNTER — Telehealth: Payer: Self-pay | Admitting: Family Medicine

## 2021-07-19 ENCOUNTER — Other Ambulatory Visit: Payer: Self-pay | Admitting: Family Medicine

## 2021-07-19 ENCOUNTER — Other Ambulatory Visit: Payer: Self-pay

## 2021-07-19 ENCOUNTER — Other Ambulatory Visit: Payer: Self-pay | Admitting: Nurse Practitioner

## 2021-07-19 ENCOUNTER — Other Ambulatory Visit: Payer: Self-pay | Admitting: Physician Assistant

## 2021-07-19 DIAGNOSIS — S22089D Unspecified fracture of T11-T12 vertebra, subsequent encounter for fracture with routine healing: Secondary | ICD-10-CM

## 2021-07-19 DIAGNOSIS — M4802 Spinal stenosis, cervical region: Secondary | ICD-10-CM

## 2021-07-19 DIAGNOSIS — G629 Polyneuropathy, unspecified: Secondary | ICD-10-CM

## 2021-07-19 NOTE — Telephone Encounter (Signed)
Requested medication (s) are due for refill today: yes  Requested medication (s) are on the active medication list: yes  Last refill:  06/30/21 ended 06/30/21  Future visit scheduled: yes  Notes to clinic:  med not delegated to NT to RF   Requested Prescriptions  Pending Prescriptions Disp Refills   acetaminophen-codeine (TYLENOL #3) 300-30 MG tablet 30 tablet 0    Sig: Take 1 tablet by mouth every 12 (twelve) hours as needed for moderate pain.     Not Delegated - Analgesics:  Opioid Agonist Combinations Failed - 07/19/2021  9:18 AM      Failed - This refill cannot be delegated      Failed - Urine Drug Screen completed in last 360 days      Passed - Valid encounter within last 6 months    Recent Outpatient Visits           2 weeks ago Cervical stenosis of spine   [place], [place], [place]   2 months ago Closed fracture of twelfth thoracic vertebra with routine healing, unspecified fracture morphology, subsequent encounter   [place], Enobong, MD   4 months ago Tobacco abuse   [place], Charlane Ferretti, MD   7 months ago Abnormal CT of the abdomen   [place], Charlane Ferretti, MD   11 months ago DCM (dilated cardiomyopathy) [facility])   [place], MD       Future Appointments             In 1 month Charlott Rakes, MD [place]

## 2021-07-19 NOTE — Telephone Encounter (Signed)
Requested medication (s) are due for refill today - no  Requested medication (s) are on the active medication list -no  Future visit scheduled -yes  Last refill: 03/05/21  Notes to clinic: Request RF: non delegated Rx, no longer current on medication list  Requested Prescriptions  Pending Prescriptions Disp Refills   cyclobenzaprine (FLEXERIL) 10 MG tablet 30 tablet 0    Sig: TAKE 1 TABLET (10 MG TOTAL) BY MOUTH AT BEDTIME.     Not Delegated - Analgesics:  Muscle Relaxants Failed - 07/19/2021  9:18 AM      Failed - This refill cannot be delegated      Passed - Valid encounter within last 6 months    Recent Outpatient Visits           2 weeks ago Cervical stenosis of spine   [place], [place], [place]   2 months ago Closed fracture of twelfth thoracic vertebra with routine healing, unspecified fracture morphology, subsequent encounter   [place], Enobong, MD   4 months ago Tobacco abuse   [place], Charlane Ferretti, MD   7 months ago Abnormal CT of the abdomen   [place], Charlane Ferretti, MD   11 months ago DCM (dilated cardiomyopathy) [facility])   [place], Enobong, MD       Future Appointments             In 1 month Charlott Rakes, MD [place]               Requested Prescriptions  Pending Prescriptions Disp Refills   cyclobenzaprine (FLEXERIL) 10 MG tablet 30 tablet 0    Sig: TAKE 1 TABLET (10 MG TOTAL) BY MOUTH AT BEDTIME.     Not Delegated - Analgesics:  Muscle Relaxants Failed - 07/19/2021  9:18 AM      Failed - This refill cannot be delegated      Passed - Valid encounter within last 6 months    Recent Outpatient Visits           2 weeks ago Cervical stenosis of spine   [place], [place], [place]   2  months ago Closed fracture of twelfth thoracic vertebra with routine healing, unspecified fracture morphology, subsequent encounter   [place], Enobong, MD   4 months ago Tobacco abuse   [place], Charlane Ferretti, MD   7 months ago Abnormal CT of the abdomen   [place], Charlane Ferretti, MD   11 months ago DCM (dilated cardiomyopathy) [facility])   [place], Enobong, MD       Future Appointments             In 1 month Charlott Rakes, MD [place]

## 2021-07-19 NOTE — Telephone Encounter (Signed)
Requested medication (s) are due for refill today - unsure  Requested medication (s) are on the active medication list -yes  Future visit scheduled -yes  Last refill: 06/09/21 #30 2 RF  Notes to clinic: Request RF: Attempted to call pharmacy for review- no answer- it looks like this has been filled 06/09/21 on med list- unable to verify with pharmacy- sent for review   Requested Prescriptions  Pending Prescriptions Disp Refills   pantoprazole (PROTONIX) 40 MG tablet 90 tablet 3    Sig: TAKE 1 TABLET (40 MG TOTAL) BY MOUTH IN THE MORNING.     Gastroenterology: Proton Pump Inhibitors Passed - 07/19/2021  9:18 AM      Passed - Valid encounter within last 12 months    Recent Outpatient Visits           2 weeks ago Cervical stenosis of spine   [place], [place], [place]   2 months ago Closed fracture of twelfth thoracic vertebra with routine healing, unspecified fracture morphology, subsequent encounter   [place], Enobong, MD   4 months ago Tobacco abuse   [place], Enobong, MD   7 months ago Abnormal CT of the abdomen   [place], Charlane Ferretti, MD   11 months ago DCM (dilated cardiomyopathy) [facility])   [place], Enobong, MD       Future Appointments             In 1 month Charlott Rakes, MD [place]               Requested Prescriptions  Pending Prescriptions Disp Refills   pantoprazole (PROTONIX) 40 MG tablet 90 tablet 3    Sig: TAKE 1 TABLET (40 MG TOTAL) BY MOUTH IN THE MORNING.     Gastroenterology: Proton Pump Inhibitors Passed - 07/19/2021  9:18 AM      Passed - Valid encounter within last 12 months    Recent Outpatient Visits           2 weeks ago Cervical stenosis of spine   [place], [place],  [place]   2 months ago Closed fracture of twelfth thoracic vertebra with routine healing, unspecified fracture morphology, subsequent encounter   [place], Enobong, MD   4 months ago Tobacco abuse   [place], Enobong, MD   7 months ago Abnormal CT of the abdomen   [place], Charlane Ferretti, MD   11 months ago DCM (dilated cardiomyopathy) [facility])   [place], MD       Future Appointments             In 1 month Charlott Rakes, MD [place]
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
## 2021-07-19 NOTE — Telephone Encounter (Signed)
Requested Prescriptions  Pending Prescriptions Disp Refills  . furosemide (LASIX) 40 MG tablet [Pharmacy Med Name: FUROSEMIDE 40MG  TABLETS] 30 tablet 0    Sig: TAKE 1 TABLET(40 MG) BY MOUTH DAILY     Cardiovascular:  Diuretics - Loop Failed - 07/19/2021  7:53 AM      Failed - Na in normal range and within 360 days    Sodium  Date Value Ref Range Status  06/30/2021 129 (L) 134 - 144 mmol/L Final         Passed - K in normal range and within 360 days    Potassium  Date Value Ref Range Status  06/30/2021 3.9 3.5 - 5.2 mmol/L Final         Passed - Ca in normal range and within 360 days    Calcium  Date Value Ref Range Status  06/30/2021 9.8 8.7 - 10.2 mg/dL Final   Calcium, Ion  Date Value Ref Range Status  04/03/2021 1.06 (L) 1.15 - 1.40 mmol/L Final         Passed - Cr in normal range and within 360 days    Creat  Date Value Ref Range Status  11/06/2020 0.60 (L) 0.70 - 1.33 mg/dL Final    Comment:    For patients >58 years of age, the reference limit for Creatinine is approximately 13% higher for people identified as African-American. .    Creatinine, Ser  Date Value Ref Range Status  06/30/2021 0.77 0.76 - 1.27 mg/dL Final         Passed - Last BP in normal range    BP Readings from Last 1 Encounters:  05/03/21 133/86         Passed - Valid encounter within last 6 months    Recent Outpatient Visits          2 weeks ago Cervical stenosis of spine   [place], [place], [place]   2 months ago Closed fracture of twelfth thoracic vertebra with routine healing, unspecified fracture morphology, subsequent encounter   [place], Enobong, MD   4 months ago Tobacco abuse   [place], Charlane Ferretti, MD   7 months ago Abnormal CT of the abdomen   [place], Charlane Ferretti, MD   11 months ago DCM (dilated cardiomyopathy) [facility])    [place], MD      Future Appointments            In 1 month Charlott Rakes, MD [place]

## 2021-07-20 ENCOUNTER — Other Ambulatory Visit: Payer: Self-pay

## 2021-07-20 MED ORDER — CYCLOBENZAPRINE HCL 10 MG PO TABS
10.0000 mg | ORAL_TABLET | Freq: Every day | ORAL | 0 refills | Status: DC
  Filled 2021-07-20: qty 30, 30d supply, fill #0

## 2021-07-20 MED ORDER — ACETAMINOPHEN-CODEINE #3 300-30 MG PO TABS
1.0000 | ORAL_TABLET | Freq: Two times a day (BID) | ORAL | 0 refills | Status: DC | PRN
  Filled 2021-07-20: qty 10, 5d supply, fill #0

## 2021-07-20 MED ORDER — FUROSEMIDE 40 MG PO TABS
ORAL_TABLET | Freq: Every day | ORAL | 1 refills | Status: DC
  Filled 2021-07-20: qty 30, 30d supply, fill #0

## 2021-07-20 NOTE — Telephone Encounter (Signed)
Refill Request.  

## 2021-07-21 ENCOUNTER — Other Ambulatory Visit: Payer: Self-pay | Admitting: Family Medicine

## 2021-07-21 ENCOUNTER — Other Ambulatory Visit: Payer: Self-pay

## 2021-07-21 DIAGNOSIS — G629 Polyneuropathy, unspecified: Secondary | ICD-10-CM

## 2021-07-21 MED ORDER — GABAPENTIN 300 MG PO CAPS: ORAL_CAPSULE | Freq: Two times a day (BID) | ORAL | 3 refills | Status: DC

## 2021-07-21 NOTE — Telephone Encounter (Signed)
Copied from [place] [id number]. Topic: Quick Communication - Rx Refill/Question >> Jul 21, 2021  9:07 AM Yvette Rack wrote: Medication: gabapentin (NEURONTIN) 300 MG capsule  Has the patient contacted their pharmacy? No. Pt requests that the Rx go to another pharmacy (Agent: If no, request that the patient contact the pharmacy for the refill.) (Agent: If yes, when and what did the pharmacy advise?)  Preferred Pharmacy (with phone number or street name): [facility], [place] [address]   Phone: [phone]   Fax: [phone]  Has the patient been seen for an appointment in the last year OR does the patient have an upcoming appointment? Yes.    Agent: Please be advised that RX refills may take up to 3 business days. We ask that you follow-up with your pharmacy.

## 2021-07-22 ENCOUNTER — Other Ambulatory Visit: Payer: Self-pay

## 2021-07-26 ENCOUNTER — Other Ambulatory Visit: Payer: Self-pay

## 2021-07-27 ENCOUNTER — Other Ambulatory Visit: Payer: Self-pay

## 2021-08-05 ENCOUNTER — Encounter: Payer: Self-pay | Admitting: Physician Assistant

## 2021-08-05 ENCOUNTER — Ambulatory Visit: Payer: Medicaid Other | Attending: Physician Assistant | Admitting: Physician Assistant

## 2021-08-05 ENCOUNTER — Other Ambulatory Visit: Payer: Self-pay

## 2021-08-05 ENCOUNTER — Other Ambulatory Visit: Payer: Self-pay | Admitting: Family Medicine

## 2021-08-05 ENCOUNTER — Telehealth: Payer: Self-pay | Admitting: Physician Assistant

## 2021-08-05 VITALS — BP 142/89 | Ht 73.0 in | Wt 118.1 lb

## 2021-08-05 DIAGNOSIS — K7010 Alcoholic hepatitis without ascites: Secondary | ICD-10-CM

## 2021-08-05 DIAGNOSIS — G629 Polyneuropathy, unspecified: Secondary | ICD-10-CM | POA: Diagnosis not present

## 2021-08-05 DIAGNOSIS — M25572 Pain in left ankle and joints of left foot: Secondary | ICD-10-CM

## 2021-08-05 DIAGNOSIS — M25571 Pain in right ankle and joints of right foot: Secondary | ICD-10-CM

## 2021-08-05 DIAGNOSIS — D649 Anemia, unspecified: Secondary | ICD-10-CM | POA: Diagnosis not present

## 2021-08-05 DIAGNOSIS — Z1331 Encounter for screening for depression: Secondary | ICD-10-CM

## 2021-08-05 DIAGNOSIS — G8929 Other chronic pain: Secondary | ICD-10-CM

## 2021-08-05 DIAGNOSIS — E871 Hypo-osmolality and hyponatremia: Secondary | ICD-10-CM | POA: Diagnosis not present

## 2021-08-05 DIAGNOSIS — K7682 Hepatic encephalopathy: Secondary | ICD-10-CM

## 2021-08-05 MED ORDER — DULOXETINE HCL 60 MG PO CPEP: ORAL_CAPSULE | Freq: Every day | ORAL | 3 refills | Status: DC

## 2021-08-05 MED ORDER — GABAPENTIN 300 MG PO CAPS: ORAL_CAPSULE | Freq: Two times a day (BID) | ORAL | 3 refills | Status: DC

## 2021-08-05 MED ORDER — LACTULOSE ENCEPHALOPATHY 10 GM/15ML PO SOLN: ORAL | 1 refills | Status: DC

## 2021-08-05 NOTE — Progress Notes (Signed)
Patient ID: Richard [name], male   DOB: 1966/07/05, 55 y.o.   MRN: [id number]   Richard [name], is a 55 y.o. male  XVQ:[id number]  [id number]  DOB - [birth year]  Chief Complaint  Patient presents with   Knee Pain       Subjective:   Richard [name] is a 55 y.o. male here today for a B feet and ankle/LE/feet pain and neuropathy.  He has not been taking gabapentin.  He wants to be referred to a bone specialist and have xrays done.  no N/V/D. Denies SI/HI.  His niece is here with him today. Not taking cymbalta. No problems updated.   ALLERGIES: Allergies  Allergen Reactions   Aspirin Other (See Comments)    Acid reflux    Aspirin Other (See Comments)    Caused acid reflux   Penicillins Hives    Has patient had a PCN reaction causing immediate rash, facial/tongue/throat swelling, SOB or lightheadedness with hypotension: yes Has patient had a PCN reaction causing severe rash involving mucus membranes or skin necrosis: no Has patient had a PCN reaction that required hospitalization: no Has patient had a PCN reaction occurring within the last 10 years: no If all of the above answers are "NO", then may proceed with Cephalosporin use.    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   Alcoholic hepatitis    Alcoholic ketoacidosis 7/34/1937   Ascites 12/13/2019   Aspiration pneumonia ([place]) 08/20/2016   Chest pain of uncertain etiology    Chronic systolic CHF (congestive heart failure) (HCC)    Cirrhosis (HCC)    Colon cancer ([place])    DCM (dilated cardiomyopathy) ([place])    Drug abuse ([place]) 01/07/2020   Elevated troponin 06/27/2018   ETOH abuse    Gastropathy 08/14/2018   Heme positive stool 11/13/2017   Hepatic steatosis 08/21/2016   High anion gap metabolic acidosis  9/0/2409   History of colon cancer    HTN (hypertension)    Hypertensive urgency 06/27/2018   Hypoglycemia 06/27/2018   Hypokalemia 01/07/2020   Hypomagnesemia    Hypophosphatemia    Lactic acidosis 08/20/2016   Leukocytosis 01/07/2020   Pancreatitis 08/2018   Polyp of ascending colon    Prolonged Q-T interval on ECG    Prolonged QT interval    Protein-calorie malnutrition, severe 05/02/2018   PUD (peptic ulcer disease)    SBP (spontaneous bacterial peritonitis) ([place]) 01/07/2020   Sepsis ([place]) 08/20/2016   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 Authorizing Provider  acetaminophen-codeine (TYLENOL #3) 300-30 MG tablet Take 1 tablet by mouth every 12 (twelve) hours as needed for moderate pain. 06/30/21  Yes Freeman Caldron M, PA-C  acetaminophen-codeine (TYLENOL #3) 300-30 MG tablet Take 1 tablet by mouth every 12 (twelve) hours as needed for moderate pain. 07/20/21  Yes Charlott Rakes, MD  aspirin 81 MG EC tablet Take 1 tablet (81 mg total) by mouth daily. 06/16/20  Yes Fulp, Cammie, MD  Cholecalciferol (VITAMIN D-3) 125 MCG (5000 UT) TABS Take 2 PO qd x 3 months, then 1 PO qd long-term after that 11/11/20  Yes Hilts, Legrand Como, MD  cyclobenzaprine (FLEXERIL) 10 MG tablet Take 1 tablet (10 mg total) by mouth at bedtime. 07/20/21 07/20/22 Yes [place], Charlane Ferretti, MD  diclofenac Sodium (VOLTAREN) 1 % GEL APPLY 2 G TOPICALLY 4 (FOUR) TIMES DAILY. 06/11/21 06/11/22 Yes Charlott Rakes, MD  ferrous gluconate (FERGON) 324 MG tablet Take 1 tablet (324 mg total) by mouth daily with breakfast. 11/11/20  Yes Hilts, Legrand Como, MD  furosemide (LASIX) 40 MG tablet TAKE 1 TABLET(40 MG) BY MOUTH DAILY 07/19/21  Yes [place]lin, Enobong, MD  furosemide (LASIX) 40 MG tablet TAKE 1 TABLET (40 MG TOTAL) BY MOUTH DAILY. 07/20/21 07/20/22 Yes Josue Hector, MD   Hyoscyamine Sulfate SL 0.125 MG SUBL PLACE 1 TABLET (0.125 MG TOTAL) UNDER THE TONGUE EVERY 8 (EIGHT) HOURS AS NEEDED. 12/14/20 12/14/21 Yes Zehr, Janett Billow D, PA-C  lidocaine (LIDODERM) 5 % Place 1 patch onto the skin daily. Remove & discard patch within 12 hours or as directed by MD 03/03/21 03/03/22 Yes Charlott Rakes, MD  magnesium oxide (MAG-OX) 400 (241.3 Mg) MG tablet Take 1 tablet (400 mg total) by mouth daily. 06/16/20  Yes Fulp, Cammie, MD  metoprolol succinate (TOPROL-XL) 25 MG 24 hr tablet TAKE 1 TABLET (25 MG TOTAL) BY MOUTH DAILY. 03/03/21 03/03/22 Yes [place], Charlane Ferretti, MD  pantoprazole (PROTONIX) 40 MG tablet Take 1 tablet (40 mg total) by mouth daily. 06/09/21 09/07/21 Yes [place], Charlane Ferretti, MD  DULoxetine (CYMBALTA) 60 MG capsule TAKE 1 CAPSULE (60 MG TOTAL) BY MOUTH DAILY. 08/05/21 08/05/22  Argentina Donovan, PA-C  gabapentin (NEURONTIN) 300 MG capsule TAKE 2 CAPSULES (600 MG TOTAL) BY MOUTH 2 (TWO) TIMES DAILY. 08/05/21 08/05/22  Argentina Donovan, PA-C  lactulose, encephalopathy, (CHRONULAC) 10 GM/15ML SOLN TAKE 30 MLS (20 G TOTAL) BY MOUTH 2 (TWO) TIMES DAILY. 08/05/21 08/05/22  Argentina Donovan, PA-C  spironolactone (ALDACTONE) 100 MG tablet Take 1 tablet (100 mg total) by mouth daily. 04/20/21 07/19/21  Charlott Rakes, MD    ROS: Neg HEENT Neg resp Neg cardiac Neg GI Neg GU Neg MS Neg psych  Objective:   Vitals:   08/05/21 1441  BP: (!) 142/89  SpO2: 100%  Weight: 118 lb 2 oz (53.6 kg)  Height: 6\' 1"  (1.854 m)   Exam General appearance : Awake, alert, not in any distress. Speech mumbled at times.  Difficult to understand at times.  Poor historian HEENT: Atraumatic and Normocephalic Neck: Supple, no JVD. No cervical lymphadenopathy.  Chest: Good air entry bilaterally, CTAB.  No rales/rhonchi/wheezing CVS: S1 S2 regular, no murmurs.  BLE w/o any gross abnormality of B feet, ankles or lower legs.  No swelling.  No erythema.  Neg homan's B. Extremities: B/L Lower Ext shows no  edema, both legs are warm to touch Neurology: Awake alert, and oriented X 3, CN II-XII intact, Non focal Skin: No Rash  Data Review Lab Results  Component Value Date   HGBA1C 4.6 11/06/2020   HGBA1C <4.2 (L) 01/11/2020    Assessment & Plan   1. Hyponatremia Likely due to liver diz - Comprehensive metabolic panel  2. Anemia, unspecified type  - CBC with Differential/Platelet  3. Positive depression screening Will have Asante McCoy LCSW f/up with him - gabapentin (NEURONTIN) 300 MG capsule; TAKE 2 CAPSULES (600 MG TOTAL) BY MOUTH 2 (TWO) TIMES DAILY.  Dispense: 120 capsule; Refill: 3 - DULoxetine (CYMBALTA) 60 MG capsule; TAKE 1 CAPSULE (60 MG TOTAL) BY MOUTH DAILY.  Dispense: 30 capsule; Refill: 3  4. Neuropathy Resume meds - gabapentin (NEURONTIN) 300 MG capsule;  TAKE 2 CAPSULES (600 MG TOTAL) BY MOUTH 2 (TWO) TIMES DAILY.  Dispense: 120 capsule; Refill: 3 - DULoxetine (CYMBALTA) 60 MG capsule; TAKE 1 CAPSULE (60 MG TOTAL) BY MOUTH DAILY.  Dispense: 30 capsule; Refill: 3 - Ambulatory referral to Orthopedic Surgery  5. Alcoholic hepatitis without ascites - lactulose, encephalopathy, (CHRONULAC) 10 GM/15ML SOLN; TAKE 30 MLS (20 G TOTAL) BY MOUTH 2 (TWO) TIMES DAILY.  Dispense: 1419 mL; Refill: 1  6. Hepatic encephalopathy - lactulose, encephalopathy, (CHRONULAC) 10 GM/15ML SOLN; TAKE 30 MLS (20 G TOTAL) BY MOUTH 2 (TWO) TIMES DAILY.  Dispense: 1419 mL; Refill: 1  7. Chronic pain of both ankles For more than a year-likely arthritis/degenerative changes - Ambulatory referral to Orthopedic Surgery    Patient have been counseled extensively about nutrition and exercise. Other issues discussed during this visit include: low cholesterol diet, weight control and daily exercise, foot care, annual eye examinations at Ophthalmology, importance of adherence with medications and regular follow-up. We also discussed long term complications of uncontrolled diabetes and hypertension.    Return in about 2 months (around 10/05/2021) for with Dr Margarita Rana for chronic problems.  The patient was given clear instructions to go to ER or return to medical center if symptoms don't improve, worsen or new problems develop. The patient verbalized understanding. The patient was told to call to get lab results if they haven't heard anything in the next week.      Freeman Caldron, PA-C [facility] and [place], [place]   08/05/2021, 2:56 PM

## 2021-08-05 NOTE — Telephone Encounter (Signed)
Requested Prescriptions  Pending Prescriptions Disp Refills  . lactulose (CHRONULAC) 10 GM/15ML solution [Pharmacy Med Name: LACTULOSE 10GM/15ML SOLUTION] 1419 mL 1    Sig: TAKE 30 MLS BY MOUTH TWICE DAILY.     Gastroenterology:  Laxatives Passed - 08/05/2021 11:36 AM      Passed - Valid encounter within last 12 months    Recent Outpatient Visits          Today Hyponatremia   [facility], [place], [place]   1 month ago Cervical stenosis of spine   [place], [place], [place]   3 months ago Closed fracture of twelfth thoracic vertebra with routine healing, unspecified fracture morphology, subsequent encounter   [place], Enobong, MD   5 months ago Tobacco abuse   [place], Enobong, MD   7 months ago Abnormal CT of the abdomen   [place], Enobong, MD      Future Appointments            In 3 weeks Charlott Rakes, MD [place]

## 2021-08-06 LAB — CBC WITH DIFFERENTIAL/PLATELET
Basophils Absolute: 0.1 10*3/uL (ref 0.0–0.2)
Basos: 1 %
EOS (ABSOLUTE): 0.2 10*3/uL (ref 0.0–0.4)
Eos: 3 %
Hematocrit: 33.2 % — ABNORMAL LOW (ref 37.5–51.0)
Hemoglobin: 10.7 g/dL — ABNORMAL LOW (ref 13.0–17.7)
Immature Grans (Abs): 0 10*3/uL (ref 0.0–0.1)
Immature Granulocytes: 0 %
Lymphocytes Absolute: 2 10*3/uL (ref 0.7–3.1)
Lymphs: 37 %
MCH: 25.7 pg — ABNORMAL LOW (ref 26.6–33.0)
MCHC: 32.2 g/dL (ref 31.5–35.7)
MCV: 80 fL (ref 79–97)
Monocytes Absolute: 0.7 10*3/uL (ref 0.1–0.9)
Monocytes: 12 %
Neutrophils Absolute: 2.5 10*3/uL (ref 1.4–7.0)
Neutrophils: 47 %
Platelets: 144 10*3/uL — ABNORMAL LOW (ref 150–450)
RBC: 4.16 x10E6/uL (ref 4.14–5.80)
RDW: 20.1 % — ABNORMAL HIGH (ref 11.6–15.4)
WBC: 5.3 10*3/uL (ref 3.4–10.8)

## 2021-08-06 LAB — COMPREHENSIVE METABOLIC PANEL
ALT: 22 IU/L (ref 0–44)
AST: 60 IU/L — ABNORMAL HIGH (ref 0–40)
Albumin/Globulin Ratio: 1.3 (ref 1.2–2.2)
Albumin: 4.4 g/dL (ref 3.8–4.9)
Alkaline Phosphatase: 90 IU/L (ref 44–121)
BUN/Creatinine Ratio: 6 — ABNORMAL LOW (ref 9–20)
BUN: 4 mg/dL — ABNORMAL LOW (ref 6–24)
Bilirubin Total: 1 mg/dL (ref 0.0–1.2)
CO2: 21 mmol/L (ref 20–29)
Calcium: 9.3 mg/dL (ref 8.7–10.2)
Chloride: 101 mmol/L (ref 96–106)
Creatinine, Ser: 0.68 mg/dL — ABNORMAL LOW (ref 0.76–1.27)
Globulin, Total: 3.3 g/dL (ref 1.5–4.5)
Glucose: 80 mg/dL (ref 70–99)
Potassium: 3.9 mmol/L (ref 3.5–5.2)
Sodium: 139 mmol/L (ref 134–144)
Total Protein: 7.7 g/dL (ref 6.0–8.5)
eGFR: 111 mL/min/{1.73_m2} (ref >59)

## 2021-08-10 ENCOUNTER — Other Ambulatory Visit: Payer: Self-pay

## 2021-08-11 ENCOUNTER — Other Ambulatory Visit (HOSPITAL_BASED_OUTPATIENT_CLINIC_OR_DEPARTMENT_OTHER): Payer: Self-pay

## 2021-08-16 ENCOUNTER — Ambulatory Visit (INDEPENDENT_AMBULATORY_CARE_PROVIDER_SITE_OTHER): Payer: Medicaid Other | Admitting: Orthopedic Surgery

## 2021-08-16 ENCOUNTER — Ambulatory Visit (INDEPENDENT_AMBULATORY_CARE_PROVIDER_SITE_OTHER): Payer: Medicaid Other

## 2021-08-16 DIAGNOSIS — M79671 Pain in right foot: Secondary | ICD-10-CM

## 2021-08-16 DIAGNOSIS — M21371 Foot drop, right foot: Secondary | ICD-10-CM

## 2021-08-16 DIAGNOSIS — G8929 Other chronic pain: Secondary | ICD-10-CM | POA: Diagnosis not present

## 2021-08-16 DIAGNOSIS — M5442 Lumbago with sciatica, left side: Secondary | ICD-10-CM | POA: Diagnosis not present

## 2021-08-16 DIAGNOSIS — M5441 Lumbago with sciatica, right side: Secondary | ICD-10-CM | POA: Diagnosis not present

## 2021-08-19 NOTE — Telephone Encounter (Signed)
First attempt to reach pt, no answer, left vm.

## 2021-08-23 ENCOUNTER — Telehealth: Payer: Self-pay | Admitting: Family Medicine

## 2021-08-23 NOTE — Telephone Encounter (Signed)
Per epic scan has been ordered by Ortho doctor. Patient should have been informed of appointment date time and location. PCP did not orde scan.

## 2021-08-23 NOTE — Telephone Encounter (Signed)
Copied from [place] [phone]. Topic: General - Other >> Aug 23, 2021  9:05 AM Oneta Rack wrote: Reason for CRM: Bone doctor recommend patient to have another MRI, patient would like to know if PCP is aware and when and where is it scheduled.

## 2021-08-30 ENCOUNTER — Other Ambulatory Visit: Payer: Self-pay

## 2021-09-01 ENCOUNTER — Other Ambulatory Visit: Payer: Self-pay | Admitting: Family Medicine

## 2021-09-01 ENCOUNTER — Ambulatory Visit: Payer: Medicaid Other | Admitting: Family Medicine

## 2021-09-01 NOTE — Telephone Encounter (Signed)
Patient called states needs refill on water pill and pain pill, he doesn't knjow the name of them. He says she contacted the pharmacy and was told to contact us.  [facility] [place], [address] AT [address] [place] Phone:  [phone]  Fax:  [phone]

## 2021-09-02 MED ORDER — FUROSEMIDE 40 MG PO TABS: ORAL_TABLET | Freq: Every day | ORAL | 1 refills | Status: DC

## 2021-09-02 NOTE — Telephone Encounter (Signed)
Requested Prescriptions  Pending Prescriptions Disp Refills  . furosemide (LASIX) 40 MG tablet 30 tablet 1    Sig: TAKE 1 TABLET (40 MG TOTAL) BY MOUTH DAILY.     Cardiovascular:  Diuretics - Loop Failed - 09/02/2021  3:39 AM      Failed - Cr in normal range and within 360 days    Creat  Date Value Ref Range Status  11/06/2020 0.60 (L) 0.70 - 1.33 mg/dL Final    Comment:    For patients >53 years of age, the reference limit for Creatinine is approximately 13% higher for people identified as African-American. .    Creatinine, Ser  Date Value Ref Range Status  08/05/2021 0.68 (L) 0.76 - 1.27 mg/dL Final         Failed - Last BP in normal range    BP Readings from Last 1 Encounters:  08/05/21 (!) 142/89         Passed - K in normal range and within 360 days    Potassium  Date Value Ref Range Status  08/05/2021 3.9 3.5 - 5.2 mmol/L Final         Passed - Ca in normal range and within 360 days    Calcium  Date Value Ref Range Status  08/05/2021 9.3 8.7 - 10.2 mg/dL Final   Calcium, Ion  Date Value Ref Range Status  04/03/2021 1.06 (L) 1.15 - 1.40 mmol/L Final         Passed - Na in normal range and within 360 days    Sodium  Date Value Ref Range Status  08/05/2021 139 134 - 144 mmol/L Final         Passed - Valid encounter within last 6 months    Recent Outpatient Visits          4 weeks ago Hyponatremia   [place], [place], [place]   2 months ago Cervical stenosis of spine   [place], [place], [place]   4 months ago Closed fracture of twelfth thoracic vertebra with routine healing, unspecified fracture morphology, subsequent encounter   [place], Enobong, MD   6 months ago Tobacco abuse   [place], Charlane Ferretti, MD   8 months ago Abnormal CT of the abdomen   [place],  Enobong, MD      Future Appointments            In 1 week Marl[place] Sa, Tonna [place], MD [place]   In 2 months Charlott Rakes, MD [place]

## 2021-09-09 ENCOUNTER — Other Ambulatory Visit: Payer: Self-pay

## 2021-09-09 ENCOUNTER — Ambulatory Visit
Admission: RE | Admit: 2021-09-09 | Discharge: 2021-09-09 | Disposition: A | Discharge status: Home / Self Care | Payer: Medicaid Other | Source: Ambulatory Visit | Attending: Orthopedic Surgery | Admitting: Orthopedic Surgery

## 2021-09-09 ENCOUNTER — Ambulatory Visit: Payer: Self-pay | Admitting: *Deleted

## 2021-09-09 DIAGNOSIS — M79671 Pain in right foot: Secondary | ICD-10-CM

## 2021-09-09 DIAGNOSIS — M21371 Foot drop, right foot: Secondary | ICD-10-CM

## 2021-09-09 DIAGNOSIS — G8929 Other chronic pain: Secondary | ICD-10-CM

## 2021-09-09 DIAGNOSIS — M5442 Lumbago with sciatica, left side: Secondary | ICD-10-CM

## 2021-09-09 MED ORDER — GADOBENATE DIMEGLUMINE 529 MG/ML IV SOLN: 15.0000 mL | Freq: Once | INTRAVENOUS | Status: DC | PRN

## 2021-09-09 NOTE — Telephone Encounter (Signed)
Pt is evasive historian. Reports SOB "My stomach is bigger since they took me off my water pill." Reports SOB, intermittent, "Stomach bloated."  States "Lactulose is not doing right like it was with my water pill."  States went to pharmacy and they didn't have it. Refill was provided 09/02/21, receipt confirmed. Pt states he will check again with pharmacy. Also states he is vomiting at times "To get rid of some of the stuff in my stomach."  Attempted to schedule pt, no availability within protocol timeframe. Assured pt NT would route to practice for PCPs review and final disposition. Advised ED for worsening symptoms. Pt verbalizes understanding.   Please advise: [phone]

## 2021-09-09 NOTE — Telephone Encounter (Signed)
Reason for Disposition  [1] MODERATE longstanding difficulty breathing (e.g., speaks in phrases, SOB even at rest, pulse 100-120) AND [2] SAME as normal  Answer Assessment - Initial Assessment Questions 1. RESPIRATORY STATUS: "Describe your breathing?" (e.g., wheezing, shortness of breath, unable to speak, severe coughing)      SOB in mornings 2. ONSET: "When did this breathing problem begin?"      Months ago 3. PATTERN "Does the difficult breathing come and go, or has it been constant since it started?"      Intermittent 4. SEVERITY: "How bad is your breathing?" (e.g., mild, moderate, severe)    - MILD: No SOB at rest, mild SOB with walking, speaks normally in sentences, can lie down, no retractions, pulse < 100.    - MODERATE: SOB at rest, SOB with minimal exertion and prefers to sit, cannot lie down flat, speaks in phrases, mild retractions, audible wheezing, pulse 100-120.    - SEVERE: Very SOB at rest, speaks in single words, struggling to breathe, sitting hunched forward, retractions, pulse > 120      Varies 5. RECURRENT SYMPTOM: "Have you had difficulty breathing before?" If Yes, ask: "When was the last time?" and "What happened that time?"      yes 6. CARDIAC HISTORY: "Do you have any history of heart disease?" (e.g., heart attack, angina, bypass surgery, angioplasty)       7. LUNG HISTORY: "Do you have any history of lung disease?"  (e.g., pulmonary embolus, asthma, emphysema)      8. CAUSE: "What do you think is causing the breathing problem?"      Fluid 9. OTHER SYMPTOMS: "Do you have any other symptoms? (e.g., dizziness, runny nose, cough, chest pain, fever)     Bloating in your stomach 10. O2 SATURATION MONITOR:  "Do you use an oxygen saturation monitor (pulse oximeter) at home?" If Yes, "What is your reading (oxygen level) today?" "What is your usual oxygen saturation reading?" (e.g., 95%)       NA  Protocols used: Breathing Difficulty-A-AH

## 2021-09-10 ENCOUNTER — Telehealth: Payer: Self-pay

## 2021-09-10 ENCOUNTER — Other Ambulatory Visit: Payer: Self-pay

## 2021-09-10 DIAGNOSIS — M5441 Lumbago with sciatica, right side: Secondary | ICD-10-CM

## 2021-09-10 DIAGNOSIS — G8929 Other chronic pain: Secondary | ICD-10-CM

## 2021-09-10 NOTE — Telephone Encounter (Signed)
-----   Message from Newt Minion, MD sent at 09/09/2021  5:11 PM EST ----- Call patient and have them follow-up with Dr. Ernestina Patches. ----- Message ----- From: Interface, Rad Results In Sent: 09/09/2021  11:34 AM EST To: Newt Minion, MD

## 2021-09-10 NOTE — Telephone Encounter (Signed)
I have reviewed his med list and he has refills for Lasix and Spironolactone at [facility]. Please also advise of the need to make appointment with his GI. If symptoms are uncontrolled after medications he needs to go to the ED.

## 2021-09-10 NOTE — Telephone Encounter (Signed)
Called pt and advised of MRI results and referral to Dr. Ernestina Patches. Pt wishes to proceed with this and will await phone call to schedule and call with any concerns.

## 2021-09-10 NOTE — Telephone Encounter (Signed)
Called pt made aware of MD message, stated he is in need of pain medication.

## 2021-09-10 NOTE — Telephone Encounter (Signed)
I spoke with pt and scheduled appt for 09/22/21.

## 2021-09-13 ENCOUNTER — Ambulatory Visit: Payer: Medicaid Other | Admitting: Orthopedic Surgery

## 2021-09-13 NOTE — Telephone Encounter (Signed)
Please advise to schedule an office visit to discuss

## 2021-09-22 ENCOUNTER — Other Ambulatory Visit: Payer: Self-pay

## 2021-09-22 ENCOUNTER — Ambulatory Visit: Payer: Medicaid Other | Attending: Family Medicine | Admitting: Clinical

## 2021-09-22 ENCOUNTER — Encounter: Payer: Self-pay | Admitting: Orthopedic Surgery

## 2021-09-22 DIAGNOSIS — Z789 Other specified health status: Secondary | ICD-10-CM

## 2021-09-22 DIAGNOSIS — F331 Major depressive disorder, recurrent, moderate: Secondary | ICD-10-CM
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
## 2021-09-28 ENCOUNTER — Ambulatory Visit: Payer: Medicaid Other | Admitting: Physical Medicine and Rehabilitation

## 2021-09-28 ENCOUNTER — Telehealth: Payer: Self-pay | Admitting: Physical Medicine and Rehabilitation

## 2021-09-28 NOTE — Telephone Encounter (Signed)
Pt called at 10:03am to say he was lost and had to go back home to find our number. Pt would like to reschedule. Please call pt at [phone].

## 2021-09-29 ENCOUNTER — Encounter: Payer: Self-pay | Admitting: Physical Medicine and Rehabilitation

## 2021-09-29 ENCOUNTER — Other Ambulatory Visit: Payer: Self-pay

## 2021-09-29 ENCOUNTER — Ambulatory Visit (INDEPENDENT_AMBULATORY_CARE_PROVIDER_SITE_OTHER): Payer: Medicaid Other | Admitting: Physical Medicine and Rehabilitation

## 2021-09-29 VITALS — BP 142/84 | HR 71

## 2021-09-29 DIAGNOSIS — G8929 Other chronic pain: Secondary | ICD-10-CM

## 2021-09-29 DIAGNOSIS — M5116 Intervertebral disc disorders with radiculopathy, lumbar region: Secondary | ICD-10-CM | POA: Diagnosis not present

## 2021-09-29 DIAGNOSIS — M4726 Other spondylosis with radiculopathy, lumbar region: Secondary | ICD-10-CM | POA: Diagnosis not present

## 2021-09-29 DIAGNOSIS — F1010 Alcohol abuse, uncomplicated: Secondary | ICD-10-CM

## 2021-09-29 DIAGNOSIS — M79671 Pain in right foot: Secondary | ICD-10-CM

## 2021-09-29 DIAGNOSIS — M5442 Lumbago with sciatica, left side: Secondary | ICD-10-CM

## 2021-09-29 DIAGNOSIS — M5441 Lumbago with sciatica, right side: Secondary | ICD-10-CM

## 2021-09-29 NOTE — Progress Notes (Signed)
Pt state lower back pain that travels down both legs to his foot. Pt state sitting and walking makes the pain worse. Pt state he takes over the counter pain meds to help ease his pain.   Numeric Pain Rating Scale and Functional Assessment Average Pain 10 Pain Right Now 8 My pain is constant, sharp, burning, tingling, and aching Pain is worse with: walking, sitting, and some activites Pain improves with: rest and medication   In the last MONTH (on 0-10 scale) has pain interfered with the following?  1. General activity like being  able to carry out your everyday physical activities such as walking, climbing stairs, carrying groceries, or moving a chair?  Rating(7)  2. Relation with others like being able to carry out your usual social activities and roles such as  activities at home, at work and in your community. Rating(8)  3. Enjoyment of life such that you have  been bothered by emotional problems such as feeling anxious, depressed or irritable?  Rating(9)
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
## 2021-10-06 NOTE — BH Specialist Note (Signed)
Integrated Behavioral Health Initial In-Person Visit  MRN: [id number] Name: Richard [name]  Number of [place] Clinician visits:: 1/6 Session Start time: 2:40pm  Session End time: 3:15pm Total time:  25   minutes  Types of Service: Individual psychotherapy  Interpretor:No. Interpretor Name and Language: N/A   Warm Hand Off Completed.        Subjective: Richard [name] is a 56 y.o. male accompanied by  self Patient was referred by Freeman Caldron for depression and anxiety. Patient reports the following symptoms/concerns: Reports feeling depressed, decreased interest in activities, trouble sleeping, decreased energy, self-esteem disturbances, decreased appetite, trouble concentrating, anxiousness, worrying, irritability, and trouble relaxing. Reports having three shots of alcohol/week. Reports being heavy drinking in the past but  has decreased since developing health problems. Reports experiencing pain and physical health problems.  Duration of problem: 1+ year; Severity of problem: moderate  Objective: Mood: Anxious and Depressed and Affect: Appropriate Risk of harm to self or others: No plan to harm self or others  Life Context: Family and Social: Pt currently lives with family and receives support from them. School/Work: Pt is unemployed and trying to get disability.  Self-Care: Reports 3 shots of alcohol/week. Reports heavy drinking in the past but has decreased due to physical health problems.  Life Changes: Reports experiencing pain and physical health problems.  Patient and/or Family's Strengths/Protective Factors: Concrete supports in place (healthy food, safe environments, etc.)  Goals Addressed: Patient will: Reduce symptoms of: anxiety and depression Increase knowledge and/or ability of: coping skills  Demonstrate ability to: Increase healthy adjustment to current life circumstances and Increase adequate support systems for  patient/family  Progress towards Goals: Ongoing  Interventions: Interventions utilized: CBT Cognitive Behavioral Therapy and Supportive Counseling  Standardized Assessments completed: AUDIT, GAD-7, and PHQ 9  Patient and/or Family Response: Pt had difficulty talking during session due to pain. Pt constantly stated he did not feel well and wanted to go lay down.   Patient Centered Plan: Patient is on the following Treatment Plan(s):  Depression  Assessment: Denies SI/HI. Patient currently experiencing depression. Pt appears to experiences problems with alcohol use. Pt acknowledges a desire to stop completely but reports difficulty. Reports he drinks every Friday (3 shots) with a relative.   Patient may benefit from CBT and community resources. Pt had difficulty complying during session due to not feeling well. LCSWA provided psychoeducation on alcohol use and depression. LCSWA will fu with pt to discuss further needs and community resources.  Plan: Follow up with behavioral health clinician on : 10/18/21 Behavioral recommendations: Utilize provided crisis resources if SI/HI arises with plan, means, and intent.  Referral(s): [place] (In Clinic) "From scale of 1-10, how likely are you to follow plan?": 10  Jorryn Hershberger C Amato [place]ano, LCSW

## 2021-10-08 ENCOUNTER — Other Ambulatory Visit: Payer: Self-pay | Admitting: Physician Assistant

## 2021-10-08 DIAGNOSIS — K7682 Hepatic encephalopathy: Secondary | ICD-10-CM

## 2021-10-08 NOTE — Telephone Encounter (Signed)
Requested medication (s) are due for refill today: yes  Requested medication (s) are on the active medication list: yes  Last refill:  09/09/21  Future visit scheduled: 11/09/20  Notes to clinic:  was seen 11/3 and placed on this med, return in 2 months, beginning of Jan, has appt 11/09/21, unsure if was to continue this med without labs until Feb, please assess. Requested Prescriptions  Pending Prescriptions Disp Refills   lactulose, encephalopathy, ([place]) 10 GM/15ML SOLN [Pharmacy Med Name: LACTULOSE 10GM/15ML ORAL/RECTAL SOL] 1,419 mL     Sig: TAKE 30 ML BY MOUTH TWICE DAILY     Gastroenterology:  Laxatives Passed - 10/08/2021  7:35 PM      Passed - Valid encounter within last 12 months    Recent Outpatient Visits           2 months ago Hyponatremia   [place], [employer], [place]   3 months ago Cervical stenosis of spine   [place], [place], [place]   5 months ago Closed fracture of twelfth thoracic vertebra with routine healing, unspecified fracture morphology, subsequent encounter   [place], Enobong, MD   7 months ago Tobacco abuse   [place], Charlane Ferretti, MD   9 months ago Abnormal CT of the abdomen   [place], Enobong, MD       Future Appointments             In 1 month Charlott Rakes, MD [place]

## 2021-10-18 ENCOUNTER — Encounter: Payer: Medicaid Other | Admitting: Clinical

## 2021-10-20 ENCOUNTER — Other Ambulatory Visit: Payer: Self-pay

## 2021-10-20 ENCOUNTER — Ambulatory Visit (INDEPENDENT_AMBULATORY_CARE_PROVIDER_SITE_OTHER): Payer: Medicaid Other | Admitting: Physical Medicine and Rehabilitation

## 2021-10-20 ENCOUNTER — Ambulatory Visit: Payer: Self-pay

## 2021-10-20 VITALS — BP 111/74 | HR 71

## 2021-10-20 DIAGNOSIS — M5416 Radiculopathy, lumbar region: Secondary | ICD-10-CM | POA: Diagnosis not present

## 2021-10-20 MED ORDER — METHYLPREDNISOLONE ACETATE 80 MG/ML IJ SUSP
80.0000 mg | Freq: Once | INTRAMUSCULAR | Status: AC
  Administered 2021-10-20: 80 mg

## 2021-10-20 NOTE — Progress Notes (Signed)
Richard [name] - 56 y.o. male MRN [id number]  Date of birth: 1966-08-07  Office Visit Note: Visit Date: 10/20/2021 PCP: Charlott Rakes, MD Referred by: Lorine Bears, NP  Subjective: Chief Complaint  Patient presents with   Lower Back - Pain   Right Leg - Pain   Left Leg - Pain   HPI:  Richard [name] is a 56 y.o. male who comes in today at the request of Barnet Pall, FNP for planned Bilateral L5-S1 Lumbar Transforaminal epidural steroid injection with fluoroscopic guidance.  The patient has failed conservative care including home exercise, medications, time and activity modification.  This injection will be diagnostic and hopefully therapeutic.  Please see requesting physician notes for further details and justification. MRI reviewed with images and spine model.  MRI reviewed in the note below.   ROS Otherwise per HPI.  Assessment & Plan: Visit Diagnoses:    ICD-10-CM   1. Lumbar radiculopathy  M54.16 XR C-ARM NO REPORT    Epidural Steroid injection    methylPREDNISolone acetate (DEPO-MEDROL) injection 80 mg      Plan: No additional findings.   Meds & Orders:  Meds ordered this encounter  Medications   methylPREDNISolone acetate (DEPO-MEDROL) injection 80 mg    Orders Placed This Encounter  Procedures   XR C-ARM NO REPORT   Epidural Steroid injection    Follow-up: Return if symptoms worsen or fail to improve.   Procedures: No procedures performed  Lumbosacral Transforaminal Epidural Steroid Injection - Sub-Pedicular Approach with Fluoroscopic Guidance  Patient: Richard [name]      Date of Birth: 05-10-1966 MRN: [id number] PCP: Charlott Rakes, MD      Visit Date: 10/20/2021   Universal Protocol:    Date/Time: 10/20/2021  Consent Given By: the patient  Position: PRONE  Additional Comments: Vital signs were monitored before and after the procedure. Patient was prepped and draped in the usual sterile fashion. The correct patient,  procedure, and site was verified.   Injection Procedure Details:   Procedure diagnoses: Lumbar radiculopathy [M54.16]    Meds Administered:  Meds ordered this encounter  Medications   methylPREDNISolone acetate (DEPO-MEDROL) injection 80 mg    Laterality: Bilateral  Location/Site: L5  Needle:3.5 in., 22 ga.  Short bevel or Quincke spinal needle  Needle Placement: Transforaminal  Findings:    -Comments: Excellent flow of contrast along the nerve, nerve root and into the epidural space.  Procedure Details: After squaring off the end-plates to get a true AP view, the C-arm was positioned so that an oblique view of the foramen as noted above was visualized. The target area is just inferior to the "nose of the scotty dog" or sub pedicular. The soft tissues overlying this structure were infiltrated with 2-3 ml. of 1% Lidocaine without Epinephrine.  The spinal needle was inserted toward the target using a "trajectory" view along the fluoroscope beam.  Under AP and lateral visualization, the needle was advanced so it did not puncture dura and was located close the 6 O'Clock position of the pedical in AP tracterory. Biplanar projections were used to confirm position. Aspiration was confirmed to be negative for CSF and/or blood. A 1-2 ml. volume of Isovue-250 was injected and flow of contrast was noted at each level. Radiographs were obtained for documentation purposes.   After attaining the desired flow of contrast documented above, a 0.5 to 1.0 ml test dose of 0.25% Marcaine was injected into each respective transforaminal space.  The patient was observed for  90 seconds post injection.  After no sensory deficits were reported, and normal lower extremity motor function was noted,   the above injectate was administered so that equal amounts of the injectate were placed at each foramen (level) into the transforaminal epidural space.   Additional Comments:  The patient tolerated the procedure  well Dressing: 2 x 2 sterile gauze and Band-Aid    Post-procedure details: Patient was observed during the procedure. Post-procedure instructions were reviewed.  Patient left the clinic in stable condition.    Clinical History: MRI LUMBAR SPINE WITHOUT CONTRAST   TECHNIQUE: Multiplanar, multisequence MR imaging of the lumbar spine was performed. No intravenous contrast was administered.   COMPARISON:  Lumbar spine radiographs 08/16/2021 (images available, report unavailable).   FINDINGS: Intermittently motion degraded examination, limiting evaluation. Most notably, there is moderate motion degradation of the sagittal T2 sequence, severe motion degradation of the sagittal T1 sequence, and moderate motion degradation of the axial T1 sequence.   Segmentation: In correlating with the prior lumbar spine radiographs of 08/16/2021, there are 5 lumbar vertebrae and the caudal most well-formed intervertebral disc space is designated L5-S1.   Alignment: Straightening of the expected lumbar lordosis. No significant spondylolisthesis.   Vertebrae: No lumbar vertebral compression fracture. Small Schmorl nodes at T11-T12 and T12-L1. Within the limitations of motion degradation, no significant marrow edema or focal suspicious osseous lesion is identified.   Conus medullaris and cauda equina: Conus extends to the L2 level. No signal abnormality within the visualized distal spinal cord.   Paraspinal and other soft tissues: Dependence signal abnormality is questioned within the gallbladder, possibly reflecting cholecystolithiasis. Paraspinal soft tissues unremarkable.   Disc levels:   Mild multilevel disc degeneration.   Congenitally narrow lumbar spinal canal.   T11-T12: Imaged sagittally. No appreciable significant disc herniation or spinal canal stenosis. The neural foramina are incompletely imaged.   T12-L1: No significant disc herniation or stenosis.   L1-L2: No  significant disc herniation or stenosis.   L2-L3: Slight disc bulge. No significant spinal canal or foraminal stenosis.   L3-L4: Disc bulge. Mild facet arthrosis/ligamentum flavum hypertrophy. Mild bilateral subarticular narrowing (without appreciable nerve root impingement). Central canal patent. No significant foraminal stenosis.   L4-L5: Disc bulge. Mild facet arthrosis/ligamentum flavum hypertrophy. Mild bilateral subarticular narrowing with crowding of the descending L4 nerve roots (series 18, image 35). Central canal patent. Mild right neural foraminal narrowing   L5-S1: Mild facet arthrosis. No significant disc herniation or stenosis.   IMPRESSION: Significantly motion degraded examination, limiting evaluation.   Lumbar spondylosis, as outlined and with findings most notably as follows.   At L4-L5, there is multifactorial mild bilateral subarticular narrowing with crowding of the descending L5 nerve roots. Mild relative right neural foraminal narrowing also present at this level.   At L3-L4, there is multifactorial mild bilateral subarticular narrowing (without appreciable nerve root impingement).   No significant spinal canal or foraminal stenosis at the remaining levels.   Questionable signal abnormality within the dependent aspect of the gallbladder, which may reflect cholecystolithiasis. Consider a right upper quadrant abdominal ultrasound for further evaluation.     Electronically Signed   By: Kellie Simmering D.O.   On: 09/09/2021 11:31     Objective:  VS:  HT:     WT:    BMI:      BP:111/74   HR:71bpm   TEMP: ( )   RESP:  Physical Exam Vitals and nursing note reviewed.  Constitutional:      General: He  is not in acute distress.    Appearance: Normal appearance. He is not ill-appearing.     Comments: Very thin  HENT:     Head: Normocephalic and atraumatic.     Right Ear: External ear normal.     Left Ear: External ear normal.     Nose: No congestion.   Eyes:     Extraocular Movements: Extraocular movements intact.  Cardiovascular:     Rate and Rhythm: Normal rate.     Pulses: Normal pulses.  Pulmonary:     Effort: Pulmonary effort is normal. No respiratory distress.  Abdominal:     General: There is no distension.     Palpations: Abdomen is soft.  Musculoskeletal:        General: No tenderness or signs of injury.     Cervical back: Neck supple.     Right lower leg: No edema.     Left lower leg: No edema.     Comments: Patient has good distal strength without clonus.  Skin:    Findings: No erythema or rash.  Neurological:     General: No focal deficit present.     Mental Status: He is alert and oriented to person, place, and time.     Sensory: No sensory deficit.     Motor: No weakness or abnormal muscle tone.     Coordination: Coordination normal.  Psychiatric:        Mood and Affect: Mood normal.        Behavior: Behavior normal.     Imaging: No results found.

## 2021-10-20 NOTE — Progress Notes (Signed)
Pt state lower back pain that travels down both legs to his foot. Pt state sitting and walking makes the pain worse. Pt state he takes over the counter pain meds to help ease his pain.   Numeric Pain Rating Scale and Functional Assessment Average Pain 7   In the last MONTH (on 0-10 scale) has pain interfered with the following?  1. General activity like being  able to carry out your everyday physical activities such as walking, climbing stairs, carrying groceries, or moving a chair?  Rating(10)   +Driver, -BT, -Dye Allergies.

## 2021-10-20 NOTE — Patient Instructions (Signed)

## 2021-10-20 NOTE — Procedures (Signed)
Lumbosacral Transforaminal Epidural Steroid Injection - Sub-Pedicular Approach with Fluoroscopic Guidance  Patient: Richard [name]      Date of Birth: Mar 27, 1966 MRN: [id number] PCP: Charlott Rakes, MD      Visit Date: 10/20/2021   Universal Protocol:    Date/Time: 10/20/2021  Consent Given By: the patient  Position: PRONE  Additional Comments: Vital signs were monitored before and after the procedure. Patient was prepped and draped in the usual sterile fashion. The correct patient, procedure, and site was verified.   Injection Procedure Details:   Procedure diagnoses: Lumbar radiculopathy [M54.16]    Meds Administered:  Meds ordered this encounter  Medications   methylPREDNISolone acetate (DEPO-MEDROL) injection 80 mg    Laterality: Bilateral  Location/Site: L5  Needle:3.5 in., 22 ga.  Short bevel or Quincke spinal needle  Needle Placement: Transforaminal  Findings:    -Comments: Excellent flow of contrast along the nerve, nerve root and into the epidural space.  Procedure Details: After squaring off the end-plates to get a true AP view, the C-arm was positioned so that an oblique view of the foramen as noted above was visualized. The target area is just inferior to the "nose of the scotty dog" or sub pedicular. The soft tissues overlying this structure were infiltrated with 2-3 ml. of 1% Lidocaine without Epinephrine.  The spinal needle was inserted toward the target using a "trajectory" view along the fluoroscope beam.  Under AP and lateral visualization, the needle was advanced so it did not puncture dura and was located close the 6 O'Clock position of the pedical in AP tracterory. Biplanar projections were used to confirm position. Aspiration was confirmed to be negative for CSF and/or blood. A 1-2 ml. volume of Isovue-250 was injected and flow of contrast was noted at each level. Radiographs were obtained for documentation purposes.   After attaining the  desired flow of contrast documented above, a 0.5 to 1.0 ml test dose of 0.25% Marcaine was injected into each respective transforaminal space.  The patient was observed for 90 seconds post injection.  After no sensory deficits were reported, and normal lower extremity motor function was noted,   the above injectate was administered so that equal amounts of the injectate were placed at each foramen (level) into the transforaminal epidural space.   Additional Comments:  The patient tolerated the procedure well Dressing: 2 x 2 sterile gauze and Band-Aid    Post-procedure details: Patient was observed during the procedure. Post-procedure instructions were reviewed.  Patient left the clinic in stable condition.

## 2021-10-26 ENCOUNTER — Telehealth: Payer: Self-pay | Admitting: Family Medicine

## 2021-10-26 ENCOUNTER — Other Ambulatory Visit: Payer: Self-pay | Admitting: Family Medicine

## 2021-10-26 DIAGNOSIS — M4802 Spinal stenosis, cervical region: Secondary | ICD-10-CM

## 2021-10-26 DIAGNOSIS — G629 Polyneuropathy, unspecified: Secondary | ICD-10-CM

## 2021-10-26 DIAGNOSIS — Z1331 Encounter for screening for depression: Secondary | ICD-10-CM

## 2021-10-26 DIAGNOSIS — K7011 Alcoholic hepatitis with ascites: Secondary | ICD-10-CM

## 2021-10-26 DIAGNOSIS — G8929 Other chronic pain: Secondary | ICD-10-CM

## 2021-10-26 DIAGNOSIS — S22089D Unspecified fracture of T11-T12 vertebra, subsequent encounter for fracture with routine healing: Secondary | ICD-10-CM

## 2021-10-26 MED ORDER — PANTOPRAZOLE SODIUM 40 MG PO TBEC: 40.0000 mg | DELAYED_RELEASE_TABLET | Freq: Every day | ORAL | 0 refills | Status: DC

## 2021-10-26 MED ORDER — FUROSEMIDE 40 MG PO TABS: ORAL_TABLET | Freq: Every day | ORAL | 0 refills | Status: DC

## 2021-10-26 MED ORDER — GABAPENTIN 300 MG PO CAPS: ORAL_CAPSULE | Freq: Two times a day (BID) | ORAL | 0 refills | Status: DC

## 2021-10-26 NOTE — Telephone Encounter (Signed)
Medication Refill - Medication: pantoprazole (PROTONIX) 40 MG tablet furosemide (LASIX) 40 MG tablet acetaminophen-codeine (TYLENOL #3) 300-30 MG  gabapentin (NEURONTIN) 300 MG capsule Has the patient contacted their pharmacy? No. He was not able to  Preferred Pharmacy (with phone number or street name): [facility] [place], [address] AT [address] Has the patient been seen for an appointment in the last year OR does the patient have an upcoming appointment? Yes.    Agent: Please be advised that RX refills may take up to 3 business days. We ask that you follow-up with your pharmacy.

## 2021-10-26 NOTE — Telephone Encounter (Signed)
Requested Prescriptions  Pending Prescriptions Disp Refills   acetaminophen-codeine (TYLENOL #3) 300-30 MG tablet 30 tablet 0    Sig: Take 1 tablet by mouth every 12 (twelve) hours as needed for moderate pain.     Not Delegated - Analgesics:  Opioid Agonist Combinations Failed - 10/26/2021  1:24 PM      Failed - This refill cannot be delegated      Failed - Urine Drug Screen completed in last 360 days      Passed - Valid encounter within last 6 months    Recent Outpatient Visits          2 months ago Hyponatremia   [place], [place], [place]   3 months ago Cervical stenosis of spine   [place], [place], [place]   5 months ago Closed fracture of twelfth thoracic vertebra with routine healing, unspecified fracture morphology, subsequent encounter   [place], Enobong, MD   7 months ago Tobacco abuse   [place], Charlane Ferretti, MD   10 months ago Abnormal CT of the abdomen   [place], Enobong, MD      Future Appointments            In 2 weeks Charlott Rakes, MD [place]            gabapentin (NEURONTIN) 300 MG capsule 120 capsule 0    Sig: TAKE 2 CAPSULES (600 MG TOTAL) BY MOUTH 2 (TWO) TIMES DAILY.     Neurology: Anticonvulsants - gabapentin Passed - 10/26/2021  1:24 PM      Passed - Valid encounter within last 12 months    Recent Outpatient Visits          2 months ago Hyponatremia   [place], [place], [place]   3 months ago Cervical stenosis of spine   [place], [place], [place]   5 months ago Closed fracture of twelfth thoracic vertebra with routine healing, unspecified fracture morphology, subsequent encounter   [place], Enobong,  MD   7 months ago Tobacco abuse   [place], Charlane Ferretti, MD   10 months ago Abnormal CT of the abdomen   [place], Charlane Ferretti, MD      Future Appointments            In 2 weeks Charlott Rakes, MD [place]            pantoprazole (PROTONIX) 40 MG tablet 30 tablet 0    Sig: Take 1 tablet (40 mg total) by mouth daily.     Gastroenterology: Proton Pump Inhibitors Passed - 10/26/2021  1:24 PM      Passed - Valid encounter within last 12 months    Recent Outpatient Visits          2 months ago Hyponatremia   [place], [place], [place]   3 months ago Cervical stenosis of spine   [place], [place], [place]   5 months ago Closed fracture of twelfth thoracic vertebra with routine healing, unspecified fracture morphology, subsequent encounter   [facility] Charlott Rakes, MD  7 months ago Tobacco abuse   [place], [place], MD   10 months ago Abnormal CT of the abdomen   [place], Enobong, MD      Future Appointments            In 2 weeks Charlott Rakes, MD [place]            furosemide (LASIX) 40 MG tablet 30 tablet 0    Sig: TAKE 1 TABLET (40 MG TOTAL) BY MOUTH DAILY.     Cardiovascular:  Diuretics - Loop Failed - 10/26/2021  1:24 PM      Failed - Cr in normal range and within 360 days    Creat  Date Value Ref Range Status  11/06/2020 0.60 (L) 0.70 - 1.33 mg/dL Final    Comment:    For patients >39 years of age, the reference limit for Creatinine is approximately 13% higher for people identified as African-American. .    Creatinine, Ser  Date Value Ref Range Status  08/05/2021 0.68 (L) 0.76 - 1.27 mg/dL Final         Passed - K in normal  range and within 360 days    Potassium  Date Value Ref Range Status  08/05/2021 3.9 3.5 - 5.2 mmol/L Final         Passed - Ca in normal range and within 360 days    Calcium  Date Value Ref Range Status  08/05/2021 9.3 8.7 - 10.2 mg/dL Final   Calcium, Ion  Date Value Ref Range Status  04/03/2021 1.06 (L) 1.15 - 1.40 mmol/L Final         Passed - Na in normal range and within 360 days    Sodium  Date Value Ref Range Status  08/05/2021 139 134 - 144 mmol/L Final         Passed - Last BP in normal range    BP Readings from Last 1 Encounters:  10/20/21 111/74         Passed - Valid encounter within last 6 months    Recent Outpatient Visits          2 months ago Hyponatremia   [place], [place], [place]   3 months ago Cervical stenosis of spine   [place], [place], [place]   5 months ago Closed fracture of twelfth thoracic vertebra with routine healing, unspecified fracture morphology, subsequent encounter   [place], Enobong, MD   7 months ago Tobacco abuse   [place], Charlane Ferretti, MD   10 months ago Abnormal CT of the abdomen   [place], Enobong, MD      Future Appointments            In 2 weeks Charlott Rakes, MD [place]

## 2021-10-26 NOTE — Telephone Encounter (Signed)
Requested medication (s) are due for refill today - yes  Requested medication (s) are on the active medication list =yes  Future visit scheduled -yes  Last refill: 07/20/21  Notes to clinic: Request RF: non delegated Rx  Requested Prescriptions  Pending Prescriptions Disp Refills   acetaminophen-codeine (TYLENOL #3) 300-30 MG tablet 30 tablet 0    Sig: Take 1 tablet by mouth every 12 (twelve) hours as needed for moderate pain.     Not Delegated - Analgesics:  Opioid Agonist Combinations Failed - 10/26/2021  1:24 PM      Failed - This refill cannot be delegated      Failed - Urine Drug Screen completed in last 360 days      Passed - Valid encounter within last 6 months    Recent Outpatient Visits           2 months ago Hyponatremia   [place], [place], [place]   3 months ago Cervical stenosis of spine   [place], [place], [place]   5 months ago Closed fracture of twelfth thoracic vertebra with routine healing, unspecified fracture morphology, subsequent encounter   [place], Enobong, MD   7 months ago Tobacco abuse   [place], Charlane Ferretti, MD   10 months ago Abnormal CT of the abdomen   [place], Enobong, MD       Future Appointments             In 2 weeks Charlott Rakes, MD [place]            Signed Prescriptions Disp Refills   gabapentin (NEURONTIN) 300 MG capsule 120 capsule 0    Sig: TAKE 2 CAPSULES (600 MG TOTAL) BY MOUTH 2 (TWO) TIMES DAILY.     Neurology: Anticonvulsants - gabapentin Passed - 10/26/2021  1:24 PM      Passed - Valid encounter within last 12 months    Recent Outpatient Visits           2 months ago Hyponatremia   [place], [place], [place]   3 months ago Cervical stenosis of  spine   [place], [place], [place]   5 months ago Closed fracture of twelfth thoracic vertebra with routine healing, unspecified fracture morphology, subsequent encounter   [place], Enobong, MD   7 months ago Tobacco abuse   [place], Charlane Ferretti, MD   10 months ago Abnormal CT of the abdomen   [place], Charlane Ferretti, MD       Future Appointments             In 2 weeks Charlott Rakes, MD [place]             pantoprazole (PROTONIX) 40 MG tablet 30 tablet 0    Sig: Take 1 tablet (40 mg total) by mouth daily.     Gastroenterology: Proton Pump Inhibitors Passed - 10/26/2021  1:24 PM      Passed - Valid encounter within last 12 months    Recent Outpatient Visits           2 months ago Hyponatremia   [place],  Dionne Bucy, PA-C   3 months ago Cervical stenosis of spine   [place], [place], [place]   5 months ago Closed fracture of twelfth thoracic vertebra with routine healing, unspecified fracture morphology, subsequent encounter   [place], Enobong, MD   7 months ago Tobacco abuse   [place], Enobong, MD   10 months ago Abnormal CT of the abdomen   [place], Enobong, MD       Future Appointments             In 2 weeks Charlott Rakes, MD [place]             furosemide (LASIX) 40 MG tablet 30 tablet 0    Sig: TAKE 1 TABLET (40 MG TOTAL) BY MOUTH DAILY.     Cardiovascular:  Diuretics - Loop Failed - 10/26/2021  1:24 PM      Failed - Cr in normal range and within 360 days    Creat  Date Value Ref Range Status  11/06/2020 0.60 (L) 0.70 - 1.33 mg/dL Final     Comment:    For patients >3 years of age, the reference limit for Creatinine is approximately 13% higher for people identified as African-American. .    Creatinine, Ser  Date Value Ref Range Status  08/05/2021 0.68 (L) 0.76 - 1.27 mg/dL Final          Passed - K in normal range and within 360 days    Potassium  Date Value Ref Range Status  08/05/2021 3.9 3.5 - 5.2 mmol/L Final          Passed - Ca in normal range and within 360 days    Calcium  Date Value Ref Range Status  08/05/2021 9.3 8.7 - 10.2 mg/dL Final   Calcium, Ion  Date Value Ref Range Status  04/03/2021 1.06 (L) 1.15 - 1.40 mmol/L Final          Passed - Na in normal range and within 360 days    Sodium  Date Value Ref Range Status  08/05/2021 139 134 - 144 mmol/L Final          Passed - Last BP in normal range    BP Readings from Last 1 Encounters:  10/20/21 111/74          Passed - Valid encounter within last 6 months    Recent Outpatient Visits           2 months ago Hyponatremia   [place], [place], [place]   3 months ago Cervical stenosis of spine   [place], [place], [place]   5 months ago Closed fracture of twelfth thoracic vertebra with routine healing, unspecified fracture morphology, subsequent encounter   [place], Enobong, MD   7 months ago Tobacco abuse   [place], Charlane Ferretti, MD   10 months ago Abnormal CT of the abdomen   [place], Enobong, MD       Future Appointments             In 2 weeks Charlott Rakes, MD [place]               Requested  Prescriptions  Pending Prescriptions Disp Refills   acetaminophen-codeine (TYLENOL #3) 300-30 MG tablet 30 tablet 0    Sig: Take 1 tablet by mouth every 12 (twelve) hours as needed for moderate pain.      Not Delegated - Analgesics:  Opioid Agonist Combinations Failed - 10/26/2021  1:24 PM      Failed - This refill cannot be delegated      Failed - Urine Drug Screen completed in last 360 days      Passed - Valid encounter within last 6 months    Recent Outpatient Visits           2 months ago Hyponatremia   [place], [place], [place]   3 months ago Cervical stenosis of spine   [place], [place], [place]   5 months ago Closed fracture of twelfth thoracic vertebra with routine healing, unspecified fracture morphology, subsequent encounter   [place], Enobong, MD   7 months ago Tobacco abuse   [place], Charlane Ferretti, MD   10 months ago Abnormal CT of the abdomen   [place], Enobong, MD       Future Appointments             In 2 weeks Charlott Rakes, MD [place]            Signed Prescriptions Disp Refills   gabapentin (NEURONTIN) 300 MG capsule 120 capsule 0    Sig: TAKE 2 CAPSULES (600 MG TOTAL) BY MOUTH 2 (TWO) TIMES DAILY.     Neurology: Anticonvulsants - gabapentin Passed - 10/26/2021  1:24 PM      Passed - Valid encounter within last 12 months    Recent Outpatient Visits           2 months ago Hyponatremia   [place], [place], [place]   3 months ago Cervical stenosis of spine   [place], [place], [place]   5 months ago Closed fracture of twelfth thoracic vertebra with routine healing, unspecified fracture morphology, subsequent encounter   [place], Enobong, MD   7 months ago Tobacco abuse   [place], Charlane Ferretti, MD   10 months ago Abnormal CT of the abdomen   [place], Charlane Ferretti, MD       Future Appointments             In 2 weeks Charlott Rakes, MD [place]             pantoprazole (PROTONIX) 40 MG tablet 30 tablet 0    Sig: Take 1 tablet (40 mg total) by mouth daily.     Gastroenterology: Proton Pump Inhibitors Passed - 10/26/2021  1:24 PM      Passed - Valid encounter within last 12 months    Recent Outpatient Visits           2 months ago Hyponatremia   [place], [place], [place]   3 months ago Cervical stenosis of spine   [place], [place]   5 months ago Closed fracture of twelfth thoracic vertebra with routine healing, unspecified fracture  morphology, subsequent encounter   [place], Enobong, MD   7 months ago Tobacco abuse   [place], Charlane Ferretti, MD   10 months ago Abnormal CT of the abdomen   [place], Enobong, MD       Future Appointments             In 2 weeks Charlott Rakes, MD [place]             furosemide (LASIX) 40 MG tablet 30 tablet 0    Sig: TAKE 1 TABLET (40 MG TOTAL) BY MOUTH DAILY.     Cardiovascular:  Diuretics - Loop Failed - 10/26/2021  1:24 PM      Failed - Cr in normal range and within 360 days    Creat  Date Value Ref Range Status  11/06/2020 0.60 (L) 0.70 - 1.33 mg/dL Final    Comment:    For patients >39 years of age, the reference limit for Creatinine is approximately 13% higher for people identified as African-American. .    Creatinine, Ser  Date Value Ref Range Status  08/05/2021 0.68 (L) 0.76 - 1.27 mg/dL Final          Passed - K in normal range and within 360 days    Potassium  Date Value Ref Range Status  08/05/2021 3.9 3.5 - 5.2 mmol/L Final          Passed - Ca in normal range  and within 360 days    Calcium  Date Value Ref Range Status  08/05/2021 9.3 8.7 - 10.2 mg/dL Final   Calcium, Ion  Date Value Ref Range Status  04/03/2021 1.06 (L) 1.15 - 1.40 mmol/L Final          Passed - Na in normal range and within 360 days    Sodium  Date Value Ref Range Status  08/05/2021 139 134 - 144 mmol/L Final          Passed - Last BP in normal range    BP Readings from Last 1 Encounters:  10/20/21 111/74          Passed - Valid encounter within last 6 months    Recent Outpatient Visits           2 months ago Hyponatremia   [place], [place], [place]   3 months ago Cervical stenosis of spine   [place], [place], [place]   5 months ago Closed fracture of twelfth thoracic vertebra with routine healing, unspecified fracture morphology, subsequent encounter   [place], Enobong, MD   7 months ago Tobacco abuse   [place], Charlane Ferretti, MD   10 months ago Abnormal CT of the abdomen   [place], Enobong, MD       Future Appointments             In 2 weeks Charlott Rakes, MD [place]

## 2021-10-26 NOTE — Telephone Encounter (Signed)
Referral Request - Has patient seen PCP for this complaint? Yes.   *If NO, is insurance requiring patient see PCP for this issue before PCP can refer them? Referral for which specialty: pain management Preferred provider/office: any Reason for referral: pt states he wants to go to pain management for his health issues  He hopes that Dr Margarita Rana will refer him now.  He does not want to wait until his appt 2/7 to discuss with the Dr.

## 2021-10-27 NOTE — Telephone Encounter (Signed)
Referral has been placed. 

## 2021-10-28 MED ORDER — ACETAMINOPHEN-CODEINE #3 300-30 MG PO TABS: 1.0000 | ORAL_TABLET | Freq: Two times a day (BID) | ORAL | 0 refills | Status: DC | PRN

## 2021-10-28 NOTE — Telephone Encounter (Signed)
Patient aware the referral was placed.

## 2021-10-31 ENCOUNTER — Encounter (HOSPITAL_COMMUNITY): Payer: Self-pay

## 2021-10-31 ENCOUNTER — Other Ambulatory Visit: Payer: Self-pay

## 2021-10-31 ENCOUNTER — Emergency Department (HOSPITAL_COMMUNITY): Payer: Medicaid Other

## 2021-10-31 ENCOUNTER — Inpatient Hospital Stay (HOSPITAL_COMMUNITY): Payer: Medicaid Other

## 2021-10-31 ENCOUNTER — Inpatient Hospital Stay (HOSPITAL_COMMUNITY)
Admission: EM | Admit: 2021-10-31 | Discharge: 2021-11-05 | DRG: 432 | Disposition: A | Discharge status: Home Health | Payer: Medicaid Other | Attending: Internal Medicine | Admitting: Internal Medicine

## 2021-10-31 DIAGNOSIS — K7682 Hepatic encephalopathy: Secondary | ICD-10-CM | POA: Diagnosis present

## 2021-10-31 DIAGNOSIS — F10931 Alcohol use, unspecified with withdrawal delirium: Secondary | ICD-10-CM | POA: Diagnosis present

## 2021-10-31 DIAGNOSIS — Z681 Body mass index (BMI) 19 or less, adult: Secondary | ICD-10-CM

## 2021-10-31 DIAGNOSIS — Z800 Family history of malignant neoplasm of digestive organs: Secondary | ICD-10-CM

## 2021-10-31 DIAGNOSIS — D638 Anemia in other chronic diseases classified elsewhere: Secondary | ICD-10-CM | POA: Diagnosis present

## 2021-10-31 DIAGNOSIS — K760 Fatty (change of) liver, not elsewhere classified: Secondary | ICD-10-CM | POA: Diagnosis present

## 2021-10-31 DIAGNOSIS — I110 Hypertensive heart disease with heart failure: Secondary | ICD-10-CM | POA: Diagnosis present

## 2021-10-31 DIAGNOSIS — B179 Acute viral hepatitis, unspecified: Secondary | ICD-10-CM | POA: Diagnosis present

## 2021-10-31 DIAGNOSIS — I959 Hypotension, unspecified: Secondary | ICD-10-CM | POA: Diagnosis not present

## 2021-10-31 DIAGNOSIS — R64 Cachexia: Secondary | ICD-10-CM | POA: Diagnosis present

## 2021-10-31 DIAGNOSIS — E876 Hypokalemia: Secondary | ICD-10-CM | POA: Diagnosis present

## 2021-10-31 DIAGNOSIS — Z1331 Encounter for screening for depression: Secondary | ICD-10-CM

## 2021-10-31 DIAGNOSIS — E43 Unspecified severe protein-calorie malnutrition: Secondary | ICD-10-CM | POA: Diagnosis present

## 2021-10-31 DIAGNOSIS — K759 Inflammatory liver disease, unspecified: Secondary | ICD-10-CM | POA: Diagnosis present

## 2021-10-31 DIAGNOSIS — K7010 Alcoholic hepatitis without ascites: Secondary | ICD-10-CM | POA: Diagnosis not present

## 2021-10-31 DIAGNOSIS — R9431 Abnormal electrocardiogram [ECG] [EKG]: Secondary | ICD-10-CM | POA: Diagnosis not present

## 2021-10-31 DIAGNOSIS — R4182 Altered mental status, unspecified: Secondary | ICD-10-CM

## 2021-10-31 DIAGNOSIS — Z20822 Contact with and (suspected) exposure to covid-19: Secondary | ICD-10-CM | POA: Diagnosis present

## 2021-10-31 DIAGNOSIS — K8020 Calculus of gallbladder without cholecystitis without obstruction: Secondary | ICD-10-CM | POA: Diagnosis present

## 2021-10-31 DIAGNOSIS — K7011 Alcoholic hepatitis with ascites: Secondary | ICD-10-CM | POA: Diagnosis present

## 2021-10-31 DIAGNOSIS — Z79899 Other long term (current) drug therapy: Secondary | ICD-10-CM

## 2021-10-31 DIAGNOSIS — E871 Hypo-osmolality and hyponatremia: Secondary | ICD-10-CM | POA: Diagnosis present

## 2021-10-31 DIAGNOSIS — R17 Unspecified jaundice: Secondary | ICD-10-CM | POA: Diagnosis not present

## 2021-10-31 DIAGNOSIS — R791 Abnormal coagulation profile: Secondary | ICD-10-CM | POA: Diagnosis present

## 2021-10-31 DIAGNOSIS — E860 Dehydration: Secondary | ICD-10-CM | POA: Diagnosis present

## 2021-10-31 DIAGNOSIS — I420 Dilated cardiomyopathy: Secondary | ICD-10-CM | POA: Diagnosis present

## 2021-10-31 DIAGNOSIS — R627 Adult failure to thrive: Secondary | ICD-10-CM | POA: Diagnosis present

## 2021-10-31 DIAGNOSIS — M542 Cervicalgia: Secondary | ICD-10-CM

## 2021-10-31 DIAGNOSIS — Z9049 Acquired absence of other specified parts of digestive tract: Secondary | ICD-10-CM

## 2021-10-31 DIAGNOSIS — I426 Alcoholic cardiomyopathy: Secondary | ICD-10-CM | POA: Diagnosis present

## 2021-10-31 DIAGNOSIS — D696 Thrombocytopenia, unspecified: Secondary | ICD-10-CM | POA: Diagnosis present

## 2021-10-31 DIAGNOSIS — Z85038 Personal history of other malignant neoplasm of large intestine: Secondary | ICD-10-CM

## 2021-10-31 DIAGNOSIS — G629 Polyneuropathy, unspecified: Secondary | ICD-10-CM

## 2021-10-31 DIAGNOSIS — F17210 Nicotine dependence, cigarettes, uncomplicated: Secondary | ICD-10-CM | POA: Diagnosis present

## 2021-10-31 DIAGNOSIS — I5022 Chronic systolic (congestive) heart failure: Secondary | ICD-10-CM | POA: Diagnosis present

## 2021-10-31 DIAGNOSIS — Z886 Allergy status to analgesic agent status: Secondary | ICD-10-CM

## 2021-10-31 DIAGNOSIS — Z7982 Long term (current) use of aspirin: Secondary | ICD-10-CM

## 2021-10-31 DIAGNOSIS — Z880 Allergy status to penicillin: Secondary | ICD-10-CM

## 2021-10-31 DIAGNOSIS — Z86010 Personal history of colonic polyps: Secondary | ICD-10-CM

## 2021-10-31 DIAGNOSIS — N390 Urinary tract infection, site not specified: Secondary | ICD-10-CM | POA: Diagnosis present

## 2021-10-31 LAB — BASIC METABOLIC PANEL
Anion gap: 16 — ABNORMAL HIGH (ref 5–15)
BUN: 6 mg/dL (ref 6–20)
CO2: 29 mmol/L (ref 22–32)
Calcium: 8.6 mg/dL — ABNORMAL LOW (ref 8.9–10.3)
Chloride: 79 mmol/L — ABNORMAL LOW (ref 98–111)
Creatinine, Ser: 0.77 mg/dL (ref 0.61–1.24)
GFR, Estimated: >60 mL/min (ref >60)
Glucose, Bld: 81 mg/dL (ref 70–99)
Potassium: 2.5 mmol/L — CL (ref 3.5–5.1)
Sodium: 124 mmol/L — ABNORMAL LOW (ref 135–145)

## 2021-10-31 LAB — CBC WITH DIFFERENTIAL/PLATELET
Abs Immature Granulocytes: 0.04 10*3/uL (ref 0.00–0.07)
Basophils Absolute: 0 10*3/uL (ref 0.0–0.1)
Basophils Relative: 0 %
Eosinophils Absolute: 0 10*3/uL (ref 0.0–0.5)
Eosinophils Relative: 0 %
HCT: 28.2 % — ABNORMAL LOW (ref 39.0–52.0)
Hemoglobin: 10.5 g/dL — ABNORMAL LOW (ref 13.0–17.0)
Immature Granulocytes: 1 %
Lymphocytes Relative: 14 %
Lymphs Abs: 0.9 10*3/uL (ref 0.7–4.0)
MCH: 32.1 pg (ref 26.0–34.0)
MCHC: 37.2 g/dL — ABNORMAL HIGH (ref 30.0–36.0)
MCV: 86.2 fL (ref 80.0–100.0)
Monocytes Absolute: 0.7 10*3/uL (ref 0.1–1.0)
Monocytes Relative: 10 %
Neutro Abs: 5.1 10*3/uL (ref 1.7–7.7)
Neutrophils Relative %: 75 %
Platelets: 144 10*3/uL — ABNORMAL LOW (ref 150–400)
RBC: 3.27 MIL/uL — ABNORMAL LOW (ref 4.22–5.81)
RDW: 18.6 % — ABNORMAL HIGH (ref 11.5–15.5)
WBC: 6.8 10*3/uL (ref 4.0–10.5)
nRBC: 1 % — ABNORMAL HIGH (ref 0.0–0.2)

## 2021-10-31 LAB — COMPREHENSIVE METABOLIC PANEL
ALT: 109 U/L — ABNORMAL HIGH (ref 0–44)
AST: 206 U/L — ABNORMAL HIGH (ref 15–41)
Albumin: 3.2 g/dL — ABNORMAL LOW (ref 3.5–5.0)
Alkaline Phosphatase: 77 U/L (ref 38–126)
Anion gap: 20 — ABNORMAL HIGH (ref 5–15)
BUN: 9 mg/dL (ref 6–20)
CO2: 32 mmol/L (ref 22–32)
Calcium: 9.2 mg/dL (ref 8.9–10.3)
Chloride: 69 mmol/L — ABNORMAL LOW (ref 98–111)
Creatinine, Ser: 0.9 mg/dL (ref 0.61–1.24)
GFR, Estimated: >60 mL/min (ref >60)
Glucose, Bld: 94 mg/dL (ref 70–99)
Potassium: 2.2 mmol/L — CL (ref 3.5–5.1)
Sodium: 121 mmol/L — ABNORMAL LOW (ref 135–145)
Total Bilirubin: 6.9 mg/dL — ABNORMAL HIGH (ref 0.3–1.2)
Total Protein: 7.4 g/dL (ref 6.5–8.1)

## 2021-10-31 LAB — RESP PANEL BY RT-PCR (FLU A&B, COVID) ARPGX2
Influenza A by PCR: NEGATIVE
Influenza B by PCR: NEGATIVE
SARS Coronavirus 2 by RT PCR: NEGATIVE

## 2021-10-31 LAB — LIPASE, BLOOD: Lipase: 54 U/L — ABNORMAL HIGH (ref 11–51)

## 2021-10-31 LAB — BILIRUBIN, FRACTIONATED(TOT/DIR/INDIR)
Bilirubin, Direct: 3 mg/dL — ABNORMAL HIGH (ref 0.0–0.2)
Indirect Bilirubin: 2.9 mg/dL — ABNORMAL HIGH (ref 0.3–0.9)
Total Bilirubin: 5.9 mg/dL — ABNORMAL HIGH (ref 0.3–1.2)

## 2021-10-31 LAB — AMMONIA: Ammonia: 41 umol/L — ABNORMAL HIGH (ref 9–35)

## 2021-10-31 LAB — HEPATITIS PANEL, ACUTE
HCV Ab: NONREACTIVE
Hep A IgM: NONREACTIVE
Hep B C IgM: NONREACTIVE
Hepatitis B Surface Ag: NONREACTIVE

## 2021-10-31 LAB — GLUCOSE, CAPILLARY: Glucose-Capillary: 82 mg/dL (ref 70–99)

## 2021-10-31 LAB — PROTIME-INR
INR: 1.3 — ABNORMAL HIGH (ref 0.8–1.2)
Prothrombin Time: 15.8 seconds — ABNORMAL HIGH (ref 11.4–15.2)

## 2021-10-31 LAB — HIV ANTIBODY (ROUTINE TESTING W REFLEX): HIV Screen 4th Generation wRfx: NONREACTIVE

## 2021-10-31 LAB — MAGNESIUM: Magnesium: 1.9 mg/dL (ref 1.7–2.4)

## 2021-10-31 LAB — ETHANOL: Alcohol, Ethyl (B): <10 mg/dL (ref <10)

## 2021-10-31 LAB — CBG MONITORING, ED: Glucose-Capillary: 97 mg/dL (ref 70–99)

## 2021-10-31 MED ORDER — SODIUM CHLORIDE 0.9 % IV BOLUS
1000.0000 mL | Freq: Once | INTRAVENOUS | Status: AC
  Administered 2021-10-31: 1000 mL via INTRAVENOUS

## 2021-10-31 MED ORDER — THIAMINE HCL 100 MG PO TABS
100.0000 mg | ORAL_TABLET | Freq: Every day | ORAL | Status: DC
  Administered 2021-11-01 – 2021-11-05 (×5): 100 mg via ORAL
  Filled 2021-10-31 (×5): qty 1

## 2021-10-31 MED ORDER — MAGNESIUM OXIDE -MG SUPPLEMENT 400 (240 MG) MG PO TABS
800.0000 mg | ORAL_TABLET | Freq: Once | ORAL | Status: AC
Start: 2021-10-31 — End: 2021-10-31
  Administered 2021-10-31: 800 mg via ORAL
  Filled 2021-10-31: qty 2

## 2021-10-31 MED ORDER — BISACODYL 5 MG PO TBEC: 5.0000 mg | DELAYED_RELEASE_TABLET | Freq: Every day | ORAL | Status: DC | PRN

## 2021-10-31 MED ORDER — THIAMINE HCL 100 MG PO TABS: 100.0000 mg | ORAL_TABLET | Freq: Every day | ORAL | Status: DC

## 2021-10-31 MED ORDER — IOHEXOL 300 MG/ML  SOLN
100.0000 mL | Freq: Once | INTRAMUSCULAR | Status: AC | PRN
  Administered 2021-10-31: 100 mL via INTRAVENOUS

## 2021-10-31 MED ORDER — THIAMINE HCL 100 MG/ML IJ SOLN: 100.0000 mg | Freq: Every day | INTRAMUSCULAR | Status: DC

## 2021-10-31 MED ORDER — PANTOPRAZOLE SODIUM 40 MG IV SOLR
40.0000 mg | Freq: Two times a day (BID) | INTRAVENOUS | Status: DC
  Administered 2021-10-31 – 2021-11-02 (×5): 40 mg via INTRAVENOUS
  Filled 2021-10-31 (×5): qty 40

## 2021-10-31 MED ORDER — POTASSIUM CHLORIDE CRYS ER 20 MEQ PO TBCR
40.0000 meq | EXTENDED_RELEASE_TABLET | ORAL | Status: AC
  Administered 2021-10-31 (×2): 40 meq via ORAL
  Filled 2021-10-31 (×2): qty 2

## 2021-10-31 MED ORDER — POTASSIUM CHLORIDE 10 MEQ/100ML IV SOLN
10.0000 meq | INTRAVENOUS | Status: AC
  Administered 2021-10-31 (×3): 10 meq via INTRAVENOUS
  Filled 2021-10-31 (×3): qty 100

## 2021-10-31 MED ORDER — SODIUM CHLORIDE 0.9 % IV SOLN: INTRAVENOUS | Status: DC

## 2021-10-31 MED ORDER — ADULT MULTIVITAMIN W/MINERALS CH
1.0000 | ORAL_TABLET | Freq: Every day | ORAL | Status: DC
  Administered 2021-10-31 – 2021-11-05 (×6): 1 via ORAL
  Filled 2021-10-31 (×6): qty 1

## 2021-10-31 MED ORDER — OXYCODONE HCL 5 MG PO TABS
5.0000 mg | ORAL_TABLET | ORAL | Status: DC | PRN
  Administered 2021-11-01: 5 mg via ORAL
  Filled 2021-10-31: qty 1

## 2021-10-31 MED ORDER — FENTANYL CITRATE PF 50 MCG/ML IJ SOSY
12.5000 ug | PREFILLED_SYRINGE | INTRAMUSCULAR | Status: DC | PRN
  Filled 2021-10-31: qty 1

## 2021-10-31 MED ORDER — LORAZEPAM 2 MG/ML IJ SOLN
1.0000 mg | INTRAMUSCULAR | Status: AC | PRN
  Administered 2021-11-03: 1 mg via INTRAVENOUS
  Filled 2021-10-31: qty 1

## 2021-10-31 MED ORDER — LORAZEPAM 1 MG PO TABS: 1.0000 mg | ORAL_TABLET | ORAL | Status: AC | PRN

## 2021-10-31 MED ORDER — MAGNESIUM SULFATE 2 GM/50ML IV SOLN
2.0000 g | Freq: Once | INTRAVENOUS | Status: AC
  Administered 2021-10-31: 2 g via INTRAVENOUS
  Filled 2021-10-31: qty 50

## 2021-10-31 MED ORDER — THIAMINE HCL 100 MG/ML IJ SOLN
100.0000 mg | Freq: Every day | INTRAMUSCULAR | Status: DC
  Administered 2021-10-31: 100 mg via INTRAVENOUS
  Filled 2021-10-31: qty 2

## 2021-10-31 MED ORDER — POTASSIUM CHLORIDE CRYS ER 20 MEQ PO TBCR
40.0000 meq | EXTENDED_RELEASE_TABLET | Freq: Once | ORAL | Status: AC
  Administered 2021-10-31: 40 meq via ORAL
  Filled 2021-10-31: qty 2

## 2021-10-31 MED ORDER — POTASSIUM CHLORIDE 10 MEQ/100ML IV SOLN
10.0000 meq | INTRAVENOUS | Status: AC
  Administered 2021-10-31 – 2021-11-01 (×4): 10 meq via INTRAVENOUS
  Filled 2021-10-31 (×4): qty 100

## 2021-10-31 MED ORDER — FOLIC ACID 1 MG PO TABS
1.0000 mg | ORAL_TABLET | Freq: Every day | ORAL | Status: DC
  Administered 2021-10-31 – 2021-11-05 (×6): 1 mg via ORAL
  Filled 2021-10-31 (×6): qty 1

## 2021-10-31 MED ORDER — CHLORDIAZEPOXIDE HCL 25 MG PO CAPS
100.0000 mg | ORAL_CAPSULE | Freq: Once | ORAL | Status: AC
Start: 2021-10-31 — End: 2021-10-31
  Administered 2021-10-31: 100 mg via ORAL
  Filled 2021-10-31: qty 4

## 2021-10-31 NOTE — ED Provider Notes (Addendum)
[facility] EMERGENCY DEPARTMENT Provider Note   CSN: [id number] Arrival date & time: 10/31/21  1304     History  Chief Complaint  Patient presents with   Anorexia   Failure To Thrive   Weakness    Richard [name] is a 56 y.o. male.  56 yo M with a chief complaint of generalized fatigue.  Tells me that he has not really been able to eat and drink for the past couple weeks.  He does not really know why.  He has been able to drink alcohol.  He has had some abdominal discomfort off and on.  Denies fevers denies vomiting.  The history is provided by the patient.  Weakness Severity:  Moderate Onset quality:  Gradual Duration:  2 weeks Timing:  Constant Progression:  Worsening Chronicity:  New Context: alcohol use       Home Medications Prior to Admission medications   Medication Sig Start Date End Date Taking? Authorizing Provider  acetaminophen-codeine (TYLENOL #3) 300-30 MG tablet Take 1 tablet by mouth every 12 (twelve) hours as needed for moderate pain. 06/30/21   Argentina Donovan, PA-C  acetaminophen-codeine (TYLENOL #3) 300-30 MG tablet Take 1 tablet by mouth every 12 (twelve) hours as needed for moderate pain. 10/28/21   Charlott Rakes, MD  aspirin 81 MG EC tablet Take 1 tablet (81 mg total) by mouth daily. 06/16/20   Fulp, Cammie, MD  Cholecalciferol (VITAMIN D-3) 125 MCG (5000 UT) TABS Take 2 PO qd x 3 months, then 1 PO qd long-term after that 11/11/20   Hilts, Michael, MD  cyclobenzaprine (FLEXERIL) 10 MG tablet Take 1 tablet (10 mg total) by mouth at bedtime. 07/20/21 07/20/22  Charlott Rakes, MD  diclofenac Sodium (VOLTAREN) 1 % GEL APPLY 2 G TOPICALLY 4 (FOUR) TIMES DAILY. 06/11/21 06/11/22  Charlott Rakes, MD  DULoxetine (CYMBALTA) 60 MG capsule TAKE 1 CAPSULE (60 MG TOTAL) BY MOUTH DAILY. 08/05/21 08/05/22  Argentina Donovan, PA-C  ferrous gluconate (FERGON) 324 MG tablet Take 1 tablet (324 mg total) by mouth daily with breakfast. 11/11/20   Hilts,  Legrand Como, MD  furosemide (LASIX) 40 MG tablet TAKE 1 TABLET(40 MG) BY MOUTH DAILY 07/19/21   Charlott Rakes, MD  furosemide (LASIX) 40 MG tablet TAKE 1 TABLET (40 MG TOTAL) BY MOUTH DAILY. 10/26/21 10/26/22  Charlott Rakes, MD  gabapentin (NEURONTIN) 300 MG capsule TAKE 2 CAPSULES (600 MG TOTAL) BY MOUTH 2 (TWO) TIMES DAILY. 10/26/21 10/26/22  Charlott Rakes, MD  Hyoscyamine Sulfate SL 0.125 MG SUBL PLACE 1 TABLET (0.125 MG TOTAL) UNDER THE TONGUE EVERY 8 (EIGHT) HOURS AS NEEDED. 12/14/20 12/14/21  Zehr, Janett Billow D, PA-C  lactulose, encephalopathy, (CHRONULAC) 10 GM/15ML SOLN TAKE 30 ML BY MOUTH TWICE DAILY 10/11/21   Charlott Rakes, MD  lidocaine (LIDODERM) 5 % Place 1 patch onto the skin daily. Remove & discard patch within 12 hours or as directed by MD 03/03/21 03/03/22  Charlott Rakes, MD  magnesium oxide (MAG-OX) 400 (241.3 Mg) MG tablet Take 1 tablet (400 mg total) by mouth daily. 06/16/20   Fulp, Cammie, MD  metoprolol succinate (TOPROL-XL) 25 MG 24 hr tablet TAKE 1 TABLET (25 MG TOTAL) BY MOUTH DAILY. 03/03/21 03/03/22  Charlott Rakes, MD  pantoprazole (PROTONIX) 40 MG tablet Take 1 tablet (40 mg total) by mouth daily. 10/26/21 01/24/22  Charlott Rakes, MD  spironolactone (ALDACTONE) 100 MG tablet Take 1 tablet (100 mg total) by mouth daily. 04/20/21 07/19/21  Charlott Rakes, MD  Allergies    Aspirin, Aspirin, Penicillins, and Penicillins    Review of Systems   Review of Systems  Neurological:  Positive for weakness.   Physical Exam Updated Vital Signs BP 111/60 (BP Location: Right Arm)    Pulse 65    Temp 98.1 F (36.7 C) (Oral)    Resp 15    Ht 6' (1.829 m)    Wt 45.4 kg    SpO2 99%    BMI 13.56 kg/m  Physical Exam Vitals and nursing note reviewed.  Constitutional:      Appearance: He is well-developed.     Comments: Severely cachectic, temporal wasting.  Cannot visualize the tracheal rings through the skin.  HENT:     Head: Normocephalic and atraumatic.  Eyes:     Pupils: Pupils are  equal, round, and reactive to light.  Neck:     Vascular: No JVD.  Cardiovascular:     Rate and Rhythm: Normal rate and regular rhythm.     Heart sounds: No murmur heard.   No friction rub. No gallop.  Pulmonary:     Effort: No respiratory distress.     Breath sounds: No wheezing.  Abdominal:     General: There is no distension.     Tenderness: There is no abdominal tenderness. There is no guarding or rebound.  Musculoskeletal:        General: Normal range of motion.     Cervical back: Normal range of motion and neck supple.  Skin:    Coloration: Skin is not pale.     Findings: No rash.  Neurological:     Mental Status: He is alert and oriented to person, place, and time.  Psychiatric:        Behavior: Behavior normal.    ED Results / Procedures / Treatments   Labs (all labs ordered are listed, but only abnormal results are displayed) Labs Reviewed  CBC WITH DIFFERENTIAL/PLATELET - Abnormal; Notable for the following components:      Result Value   RBC 3.27 (*)    Hemoglobin 10.5 (*)    HCT 28.2 (*)    MCHC 37.2 (*)    RDW 18.6 (*)    Platelets 144 (*)    nRBC 1.0 (*)    All other components within normal limits  COMPREHENSIVE METABOLIC PANEL - Abnormal; Notable for the following components:   Sodium 121 (*)    Potassium 2.2 (*)    Chloride 69 (*)    Albumin 3.2 (*)    AST 206 (*)    ALT 109 (*)    Total Bilirubin 6.9 (*)    Anion gap 20 (*)    All other components within normal limits  LIPASE, BLOOD - Abnormal; Notable for the following components:   Lipase 54 (*)    All other components within normal limits  ETHANOL  RPR  HIV ANTIBODY (ROUTINE TESTING W REFLEX)  VITAMIN B1  MAGNESIUM  CBG MONITORING, ED    EKG EKG Interpretation  Date/Time:  Sunday October 31 2021 13:27:21 EST Ventricular Rate:  68 PR Interval:  156 QRS Duration: 100 QT Interval:  612 QTC Calculation: 652 R Axis:   82 Text Interpretation: Sinus rhythm Biatrial enlargement  Probable anteroseptal infarct, old Prolonged QT interval Baseline wander TECHNICALLY DIFFICULT Otherwise no significant change Confirmed by Deno Etienne [phone]) on 10/31/2021 1:38:21 PM  Radiology No results found.  Procedures Procedures    Medications Ordered in ED Medications  thiamine (B-1) injection 100 mg (  100 mg Intravenous Given 10/31/21 1434)  potassium chloride SA (KLOR-CON M) CR tablet 40 mEq (has no administration in time range)  potassium chloride 10 mEq in 100 mL IVPB (has no administration in time range)  magnesium oxide (MAG-OX) tablet 800 mg (has no administration in time range)  sodium chloride 0.9 % bolus 1,000 mL (1,000 mLs Intravenous New Bag/Given 10/31/21 1425)  chlordiazePOXIDE (LIBRIUM) capsule 100 mg (100 mg Oral Given 10/31/21 1438)    ED Course/ Medical Decision Making/ A&P                           Medical Decision Making Amount and/or Complexity of Data Reviewed Labs: ordered. Radiology: ordered.  Risk OTC drugs. Prescription drug management. Decision regarding hospitalization.   56 yo M with a chief complaints of not eating and drinking for a couple weeks.  He has been able to drink alcohol.  We will obtain a laboratory evaluation.  Bolus of IV fluids.  Give a dose of Librium here.  Oral trial.  Reassess.  Patient with significant hypokalemia.  Potassium of 2.2.  I relook at his EKG and he does have a prolonged QT and possibly U waves.  We will give mag.  Check a mag level.  Potassium replenishment.  He also has new LFT elevation with a total bilirubin of 6.9.  Will obtain CT imaging to assess for obstructive pathology.  Signed out to Dr. Zenia Resides, please see his note for further details of care in the ED  CRITICAL CARE Performed by: Cecilio Asper   Total critical care time: 35 minutes  Critical care time was exclusive of separately billable procedures and treating other patients.  Critical care was necessary to treat or prevent imminent or  life-threatening deterioration.  Critical care was time spent personally by me on the following activities: development of treatment plan with patient and/or surrogate as well as nursing, discussions with consultants, evaluation of patient's response to treatment, examination of patient, obtaining history from patient or surrogate, ordering and performing treatments and interventions, ordering and review of laboratory studies, ordering and review of radiographic studies, pulse oximetry and re-evaluation of patient's condition.  The patients results and plan were reviewed and discussed.   Any x-rays performed were independently reviewed by myself.   Differential diagnosis were considered with the presenting HPI.  Medications  thiamine (B-1) injection 100 mg (100 mg Intravenous Given 10/31/21 1434)  potassium chloride SA (KLOR-CON M) CR tablet 40 mEq (has no administration in time range)  potassium chloride 10 mEq in 100 mL IVPB (has no administration in time range)  magnesium oxide (MAG-OX) tablet 800 mg (has no administration in time range)  sodium chloride 0.9 % bolus 1,000 mL (1,000 mLs Intravenous New Bag/Given 10/31/21 1425)  chlordiazePOXIDE (LIBRIUM) capsule 100 mg (100 mg Oral Given 10/31/21 1438)    Vitals:   10/31/21 1316 10/31/21 1317 10/31/21 1319  BP: 111/60    Pulse: 65    Resp: 15    Temp: 98.1 F (36.7 C)    TempSrc: Oral    SpO2: 100%  99%  Weight:  45.4 kg   Height:  6' (1.829 m)     Final diagnoses:  Dehydration  Hypokalemia  Jaundice    Admission/ observation were discussed with the admitting physician, patient and/or family and they are comfortable with the plan.         Final Clinical Impression(s) / ED Diagnoses Final diagnoses:  Dehydration  Hypokalemia  Jaundice    Rx / DC Orders ED Discharge Orders     None         Deno Etienne, DO 10/31/21 [place], [place], DO 11/01/21 [phone]

## 2021-10-31 NOTE — ED Notes (Signed)
Pt back from CT

## 2021-10-31 NOTE — ED Triage Notes (Signed)
EMS reports pt presenting to ED from home with c/o generalized weakness and loss of appetite x2 weeks. Significant medical hx. VSS en route.

## 2021-10-31 NOTE — ED Notes (Signed)
Patient transported to CT 

## 2021-10-31 NOTE — ED Provider Notes (Incomplete Revision)
[facility] EMERGENCY DEPARTMENT Provider Note   CSN: [id number] Arrival date & time: 10/31/21  1304     History  Chief Complaint  Patient presents with   Anorexia   Failure To Thrive   Weakness    Richard [name] is a 56 y.o. male.  56 yo M with a chief complaint of generalized fatigue.  Tells me that he has not really been able to eat and drink for the past couple weeks.  He does not really know why.  He has been able to drink alcohol.  He has had some abdominal discomfort off and on.  Denies fevers denies vomiting.  The history is provided by the patient.  Weakness Severity:  Moderate Onset quality:  Gradual Duration:  2 weeks Timing:  Constant Progression:  Worsening Chronicity:  New Context: alcohol use       Home Medications Prior to Admission medications   Medication Sig Start Date End Date Taking? Authorizing Provider  acetaminophen-codeine (TYLENOL #3) 300-30 MG tablet Take 1 tablet by mouth every 12 (twelve) hours as needed for moderate pain. 06/30/21   Argentina Donovan, PA-C  acetaminophen-codeine (TYLENOL #3) 300-30 MG tablet Take 1 tablet by mouth every 12 (twelve) hours as needed for moderate pain. 10/28/21   Charlott Rakes, MD  aspirin 81 MG EC tablet Take 1 tablet (81 mg total) by mouth daily. 06/16/20   Fulp, Cammie, MD  Cholecalciferol (VITAMIN D-3) 125 MCG (5000 UT) TABS Take 2 PO qd x 3 months, then 1 PO qd long-term after that 11/11/20   Hilts, Michael, MD  cyclobenzaprine (FLEXERIL) 10 MG tablet Take 1 tablet (10 mg total) by mouth at bedtime. 07/20/21 07/20/22  Charlott Rakes, MD  diclofenac Sodium (VOLTAREN) 1 % GEL APPLY 2 G TOPICALLY 4 (FOUR) TIMES DAILY. 06/11/21 06/11/22  Charlott Rakes, MD  DULoxetine (CYMBALTA) 60 MG capsule TAKE 1 CAPSULE (60 MG TOTAL) BY MOUTH DAILY. 08/05/21 08/05/22  Argentina Donovan, PA-C  ferrous gluconate (FERGON) 324 MG tablet Take 1 tablet (324 mg total) by mouth daily with breakfast. 11/11/20   Hilts,  Legrand Como, MD  furosemide (LASIX) 40 MG tablet TAKE 1 TABLET(40 MG) BY MOUTH DAILY 07/19/21   Charlott Rakes, MD  furosemide (LASIX) 40 MG tablet TAKE 1 TABLET (40 MG TOTAL) BY MOUTH DAILY. 10/26/21 10/26/22  Charlott Rakes, MD  gabapentin (NEURONTIN) 300 MG capsule TAKE 2 CAPSULES (600 MG TOTAL) BY MOUTH 2 (TWO) TIMES DAILY. 10/26/21 10/26/22  Charlott Rakes, MD  Hyoscyamine Sulfate SL 0.125 MG SUBL PLACE 1 TABLET (0.125 MG TOTAL) UNDER THE TONGUE EVERY 8 (EIGHT) HOURS AS NEEDED. 12/14/20 12/14/21  Zehr, Janett Billow D, PA-C  lactulose, encephalopathy, (CHRONULAC) 10 GM/15ML SOLN TAKE 30 ML BY MOUTH TWICE DAILY 10/11/21   Charlott Rakes, MD  lidocaine (LIDODERM) 5 % Place 1 patch onto the skin daily. Remove & discard patch within 12 hours or as directed by MD 03/03/21 03/03/22  Charlott Rakes, MD  magnesium oxide (MAG-OX) 400 (241.3 Mg) MG tablet Take 1 tablet (400 mg total) by mouth daily. 06/16/20   Fulp, Cammie, MD  metoprolol succinate (TOPROL-XL) 25 MG 24 hr tablet TAKE 1 TABLET (25 MG TOTAL) BY MOUTH DAILY. 03/03/21 03/03/22  Charlott Rakes, MD  pantoprazole (PROTONIX) 40 MG tablet Take 1 tablet (40 mg total) by mouth daily. 10/26/21 01/24/22  Charlott Rakes, MD  spironolactone (ALDACTONE) 100 MG tablet Take 1 tablet (100 mg total) by mouth daily. 04/20/21 07/19/21  Charlott Rakes, MD  Allergies    Aspirin, Aspirin, Penicillins, and Penicillins    Review of Systems   Review of Systems  Neurological:  Positive for weakness.   Physical Exam Updated Vital Signs BP 111/60 (BP Location: Right Arm)    Pulse 65    Temp 98.1 F (36.7 C) (Oral)    Resp 15    Ht 6' (1.829 m)    Wt 45.4 kg    SpO2 99%    BMI 13.56 kg/m  Physical Exam Vitals and nursing note reviewed.  Constitutional:      Appearance: He is well-developed.     Comments: Severely cachectic, temporal wasting.  Cannot visualize the tracheal rings through the skin.  HENT:     Head: Normocephalic and atraumatic.  Eyes:     Pupils: Pupils are  equal, round, and reactive to light.  Neck:     Vascular: No JVD.  Cardiovascular:     Rate and Rhythm: Normal rate and regular rhythm.     Heart sounds: No murmur heard.   No friction rub. No gallop.  Pulmonary:     Effort: No respiratory distress.     Breath sounds: No wheezing.  Abdominal:     General: There is no distension.     Tenderness: There is no abdominal tenderness. There is no guarding or rebound.  Musculoskeletal:        General: Normal range of motion.     Cervical back: Normal range of motion and neck supple.  Skin:    Coloration: Skin is not pale.     Findings: No rash.  Neurological:     Mental Status: He is alert and oriented to person, place, and time.  Psychiatric:        Behavior: Behavior normal.    ED Results / Procedures / Treatments   Labs (all labs ordered are listed, but only abnormal results are displayed) Labs Reviewed  CBC WITH DIFFERENTIAL/PLATELET - Abnormal; Notable for the following components:      Result Value   RBC 3.27 (*)    Hemoglobin 10.5 (*)    HCT 28.2 (*)    MCHC 37.2 (*)    RDW 18.6 (*)    Platelets 144 (*)    nRBC 1.0 (*)    All other components within normal limits  COMPREHENSIVE METABOLIC PANEL - Abnormal; Notable for the following components:   Sodium 121 (*)    Potassium 2.2 (*)    Chloride 69 (*)    Albumin 3.2 (*)    AST 206 (*)    ALT 109 (*)    Total Bilirubin 6.9 (*)    Anion gap 20 (*)    All other components within normal limits  LIPASE, BLOOD - Abnormal; Notable for the following components:   Lipase 54 (*)    All other components within normal limits  ETHANOL  RPR  HIV ANTIBODY (ROUTINE TESTING W REFLEX)  VITAMIN B1  MAGNESIUM  CBG MONITORING, ED    EKG EKG Interpretation  Date/Time:  Sunday October 31 2021 13:27:21 EST Ventricular Rate:  68 PR Interval:  156 QRS Duration: 100 QT Interval:  612 QTC Calculation: 652 R Axis:   82 Text Interpretation: Sinus rhythm Biatrial enlargement  Probable anteroseptal infarct, old Prolonged QT interval Baseline wander TECHNICALLY DIFFICULT Otherwise no significant change Confirmed by Deno Etienne [phone]) on 10/31/2021 1:38:21 PM  Radiology No results found.  Procedures Procedures    Medications Ordered in ED Medications  thiamine (B-1) injection 100 mg (  100 mg Intravenous Given 10/31/21 1434)  potassium chloride SA (KLOR-CON M) CR tablet 40 mEq (has no administration in time range)  potassium chloride 10 mEq in 100 mL IVPB (has no administration in time range)  magnesium oxide (MAG-OX) tablet 800 mg (has no administration in time range)  sodium chloride 0.9 % bolus 1,000 mL (1,000 mLs Intravenous New Bag/Given 10/31/21 1425)  chlordiazePOXIDE (LIBRIUM) capsule 100 mg (100 mg Oral Given 10/31/21 1438)    ED Course/ Medical Decision Making/ A&P                           Medical Decision Making Amount and/or Complexity of Data Reviewed Labs: ordered. Radiology: ordered.  Risk OTC drugs. Prescription drug management.   56 yo M with a chief complaints of not eating and drinking for a couple weeks.  He has been able to drink alcohol.  We will obtain a laboratory evaluation.  Bolus of IV fluids.  Give a dose of Librium here.  Oral trial.  Reassess.  Patient with significant hypokalemia.  Potassium of 2.2.  I relook at his EKG and he does have a prolonged QT and possibly U waves.  We will give mag.  Check a mag level.  Potassium replenishment.  He also has new LFT elevation with a total bilirubin of 6.9.  Will obtain CT imaging to assess for obstructive pathology.  Signed out to Dr. Zenia Resides, please see his note for further details of care in the ED  CRITICAL CARE Performed by: Cecilio Asper   Total critical care time: 35 minutes  Critical care time was exclusive of separately billable procedures and treating other patients.  Critical care was necessary to treat or prevent imminent or life-threatening  deterioration.  Critical care was time spent personally by me on the following activities: development of treatment plan with patient and/or surrogate as well as nursing, discussions with consultants, evaluation of patient's response to treatment, examination of patient, obtaining history from patient or surrogate, ordering and performing treatments and interventions, ordering and review of laboratory studies, ordering and review of radiographic studies, pulse oximetry and re-evaluation of patient's condition.  The patients results and plan were reviewed and discussed.   Any x-rays performed were independently reviewed by myself.   Differential diagnosis were considered with the presenting HPI.  Medications  thiamine (B-1) injection 100 mg (100 mg Intravenous Given 10/31/21 1434)  potassium chloride SA (KLOR-CON M) CR tablet 40 mEq (has no administration in time range)  potassium chloride 10 mEq in 100 mL IVPB (has no administration in time range)  magnesium oxide (MAG-OX) tablet 800 mg (has no administration in time range)  sodium chloride 0.9 % bolus 1,000 mL (1,000 mLs Intravenous New Bag/Given 10/31/21 1425)  chlordiazePOXIDE (LIBRIUM) capsule 100 mg (100 mg Oral Given 10/31/21 1438)    Vitals:   10/31/21 1316 10/31/21 1317 10/31/21 1319  BP: 111/60    Pulse: 65    Resp: 15    Temp: 98.1 F (36.7 C)    TempSrc: Oral    SpO2: 100%  99%  Weight:  45.4 kg   Height:  6' (1.829 m)     Final diagnoses:  Dehydration  Hypokalemia  Jaundice    Admission/ observation were discussed with the admitting physician, patient and/or family and they are comfortable with the plan.         Final Clinical Impression(s) / ED Diagnoses Final diagnoses:  Dehydration  Hypokalemia  Jaundice    Rx / DC Orders ED Discharge Orders     None         Deno Etienne, DO 10/31/21 1546

## 2021-10-31 NOTE — ED Provider Notes (Signed)
Patient signed to me by Dr. Tyrone Nine pending results of abdominal CT which is consistent with hepatitis.  Patient also was found to be hyponatremic and hypokalemic.  Dr. Tyrone Nine initiated treatment for that.  Plan will be for admit to the hospitalist service   Lacretia Leigh, MD 10/31/21 [phone]

## 2021-10-31 NOTE — ED Notes (Signed)
IV unsuccessful x2

## 2021-10-31 NOTE — Progress Notes (Signed)
10/31/2021 2115 Received pt to room 4E-02 from ED.  Pt is A&Ox2.  C/O pain all over.  Tele monitor applied and CCMD notified.  CHG bath given.  Oriented to room, call light and bed.  Call bell in reach, bed alarm on. [place]
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
## 2021-11-01 ENCOUNTER — Inpatient Hospital Stay (HOSPITAL_COMMUNITY): Payer: Medicaid Other

## 2021-11-01 DIAGNOSIS — R9431 Abnormal electrocardiogram [ECG] [EKG]: Secondary | ICD-10-CM | POA: Diagnosis not present

## 2021-11-01 DIAGNOSIS — E876 Hypokalemia: Secondary | ICD-10-CM

## 2021-11-01 DIAGNOSIS — K7010 Alcoholic hepatitis without ascites: Secondary | ICD-10-CM

## 2021-11-01 DIAGNOSIS — R4182 Altered mental status, unspecified: Secondary | ICD-10-CM

## 2021-11-01 DIAGNOSIS — R17 Unspecified jaundice: Secondary | ICD-10-CM

## 2021-11-01 DIAGNOSIS — K760 Fatty (change of) liver, not elsewhere classified: Secondary | ICD-10-CM

## 2021-11-01 DIAGNOSIS — K759 Inflammatory liver disease, unspecified: Secondary | ICD-10-CM

## 2021-11-01 DIAGNOSIS — E43 Unspecified severe protein-calorie malnutrition: Secondary | ICD-10-CM

## 2021-11-01 DIAGNOSIS — E860 Dehydration: Secondary | ICD-10-CM

## 2021-11-01 LAB — CBC
HCT: 29.2 % — ABNORMAL LOW (ref 39.0–52.0)
Hemoglobin: 10.4 g/dL — ABNORMAL LOW (ref 13.0–17.0)
MCH: 31.1 pg (ref 26.0–34.0)
MCHC: 35.6 g/dL (ref 30.0–36.0)
MCV: 87.4 fL (ref 80.0–100.0)
Platelets: UNDETERMINED 10*3/uL (ref 150–400)
RBC: 3.34 MIL/uL — ABNORMAL LOW (ref 4.22–5.81)
RDW: 19.1 % — ABNORMAL HIGH (ref 11.5–15.5)
WBC: 5 10*3/uL (ref 4.0–10.5)
nRBC: 1.2 % — ABNORMAL HIGH (ref 0.0–0.2)

## 2021-11-01 LAB — COMPREHENSIVE METABOLIC PANEL
ALT: 95 U/L — ABNORMAL HIGH (ref 0–44)
AST: 165 U/L — ABNORMAL HIGH (ref 15–41)
Albumin: 2.8 g/dL — ABNORMAL LOW (ref 3.5–5.0)
Alkaline Phosphatase: 77 U/L (ref 38–126)
Anion gap: 12 (ref 5–15)
BUN: 6 mg/dL (ref 6–20)
CO2: 28 mmol/L (ref 22–32)
Calcium: 8.8 mg/dL — ABNORMAL LOW (ref 8.9–10.3)
Chloride: 87 mmol/L — ABNORMAL LOW (ref 98–111)
Creatinine, Ser: 0.66 mg/dL (ref 0.61–1.24)
GFR, Estimated: >60 mL/min (ref >60)
Glucose, Bld: 87 mg/dL (ref 70–99)
Potassium: 4.4 mmol/L (ref 3.5–5.1)
Sodium: 127 mmol/L — ABNORMAL LOW (ref 135–145)
Total Bilirubin: 5.7 mg/dL — ABNORMAL HIGH (ref 0.3–1.2)
Total Protein: 6.4 g/dL — ABNORMAL LOW (ref 6.5–8.1)

## 2021-11-01 LAB — GLUCOSE, CAPILLARY
Glucose-Capillary: 86 mg/dL (ref 70–99)
Glucose-Capillary: 87 mg/dL (ref 70–99)
Glucose-Capillary: 108 mg/dL — ABNORMAL HIGH (ref 70–99)
Glucose-Capillary: 97 mg/dL (ref 70–99)
Glucose-Capillary: 123 mg/dL — ABNORMAL HIGH (ref 70–99)
Glucose-Capillary: 146 mg/dL — ABNORMAL HIGH (ref 70–99)

## 2021-11-01 LAB — BASIC METABOLIC PANEL
Anion gap: 11 (ref 5–15)
Anion gap: 15 (ref 5–15)
Anion gap: 9 (ref 5–15)
BUN: 5 mg/dL — ABNORMAL LOW (ref 6–20)
BUN: 6 mg/dL (ref 6–20)
BUN: 6 mg/dL (ref 6–20)
CO2: 28 mmol/L (ref 22–32)
CO2: 29 mmol/L (ref 22–32)
CO2: 29 mmol/L (ref 22–32)
Calcium: 8.4 mg/dL — ABNORMAL LOW (ref 8.9–10.3)
Calcium: 8.7 mg/dL — ABNORMAL LOW (ref 8.9–10.3)
Calcium: 8.9 mg/dL (ref 8.9–10.3)
Chloride: 82 mmol/L — ABNORMAL LOW (ref 98–111)
Chloride: 87 mmol/L — ABNORMAL LOW (ref 98–111)
Chloride: 91 mmol/L — ABNORMAL LOW (ref 98–111)
Creatinine, Ser: 0.55 mg/dL — ABNORMAL LOW (ref 0.61–1.24)
Creatinine, Ser: 0.7 mg/dL (ref 0.61–1.24)
Creatinine, Ser: 0.73 mg/dL (ref 0.61–1.24)
GFR, Estimated: >60 mL/min (ref >60)
GFR, Estimated: >60 mL/min (ref >60)
GFR, Estimated: >60 mL/min (ref >60)
Glucose, Bld: 86 mg/dL (ref 70–99)
Glucose, Bld: 88 mg/dL (ref 70–99)
Glucose, Bld: 88 mg/dL (ref 70–99)
Potassium: 3.1 mmol/L — ABNORMAL LOW (ref 3.5–5.1)
Potassium: 3.6 mmol/L (ref 3.5–5.1)
Potassium: 4.5 mmol/L (ref 3.5–5.1)
Sodium: 125 mmol/L — ABNORMAL LOW (ref 135–145)
Sodium: 127 mmol/L — ABNORMAL LOW (ref 135–145)
Sodium: 129 mmol/L — ABNORMAL LOW (ref 135–145)

## 2021-11-01 LAB — ECHOCARDIOGRAM COMPLETE
Area-P 1/2: 2.3 cm2
Height: 72 in
S' Lateral: 3.3 cm
Weight: 1594.37 oz

## 2021-11-01 LAB — URINALYSIS, COMPLETE (UACMP) WITH MICROSCOPIC
Glucose, UA: 100 mg/dL — AB
Hgb urine dipstick: NEGATIVE
Ketones, ur: 40 mg/dL — AB
Leukocytes,Ua: NEGATIVE
Nitrite: NEGATIVE
Protein, ur: NEGATIVE mg/dL
Specific Gravity, Urine: 1.015 (ref 1.005–1.030)
pH: 8 (ref 5.0–8.0)

## 2021-11-01 LAB — RPR: RPR Ser Ql: NONREACTIVE

## 2021-11-01 LAB — VITAMIN B12: Vitamin B-12: 823 pg/mL (ref 180–914)

## 2021-11-01 LAB — PROTIME-INR
INR: 1.3 — ABNORMAL HIGH (ref 0.8–1.2)
Prothrombin Time: 15.7 seconds — ABNORMAL HIGH (ref 11.4–15.2)

## 2021-11-01 LAB — VITAMIN D 25 HYDROXY (VIT D DEFICIENCY, FRACTURES): Vit D, 25-Hydroxy: 10.31 ng/mL — ABNORMAL LOW (ref 30–100)

## 2021-11-01 MED ORDER — LACTULOSE 10 GM/15ML PO SOLN
20.0000 g | Freq: Two times a day (BID) | ORAL | Status: DC
  Administered 2021-11-01 – 2021-11-05 (×9): 20 g via ORAL
  Filled 2021-11-01 (×9): qty 30

## 2021-11-01 MED ORDER — ENOXAPARIN SODIUM 30 MG/0.3ML IJ SOSY
30.0000 mg | PREFILLED_SYRINGE | INTRAMUSCULAR | Status: DC
  Administered 2021-11-01: 30 mg via SUBCUTANEOUS
  Filled 2021-11-01: qty 0.3

## 2021-11-01 MED ORDER — ALBUMIN HUMAN 25 % IV SOLN
25.0000 g | Freq: Once | INTRAVENOUS | Status: AC
  Administered 2021-11-01: 25 g via INTRAVENOUS
  Filled 2021-11-01: qty 100

## 2021-11-01 MED ORDER — ALBUMIN HUMAN 25 % IV SOLN
25.0000 g | Freq: Four times a day (QID) | INTRAVENOUS | Status: AC
  Administered 2021-11-01 (×2): 25 g via INTRAVENOUS
  Filled 2021-11-01 (×2): qty 100

## 2021-11-01 NOTE — Progress Notes (Signed)
Echocardiogram 2D Echocardiogram has been performed.  Oneal Deputy Tihanna Goodson RDCS 11/01/2021, 9:11 AM
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
## 2021-11-01 NOTE — Consult Note (Signed)
[facility] Consult: 10:22 AM 11/01/2021  LOS: 1 day    Referring Provider: Dr Tyrell Antonio  Primary Care Physician:  Charlott Rakes, MD Primary Gastroenterologist:  Dr. Henrene Pastor.  Last GI OV 08/2020.      Reason for Consultation:  ETOH hepatitis.     HPI: Richard [name] is a 56 y.o. male.  Remote colon cancer resection 2007.  Adenomatous colon polyps.  Alcoholic with history of alcoholic pancreatitis, alcoholic cardiomyopathy, alcoholic hepatitis.  Fatty liver.  Thrombocytopenia.  Ascites.  Underwent 2.9 L paracentesis 01/2020 with no SBP.  AFP 2.5 mid November 2021.  No suspicious liver lesions on ultrasound 06/2020.  As of fall 2021 had been maintained on Lasix 40 mg, Aldactone 100 mg daily  12/2019 colonoscopy for evaluation FOBT positive, IDA.  Resection colon cancer 2007.  Small.  Nonbleeding erosions in the sigmoid, ascending and ileocecal valve.  Resection 2 mm polyp in ascending colon.  Healthy appearing colon oh-colonic anastomosis.  Nonbleeding mixed hemorrhoids.  Path from IC valve was benign colonic mucosa with focal erosions, no microscopic colitis.  Polyp pathology was tubular adenoma without HGD.   12/2019 EGD: Grade B esophagitis without bleeding.  Small HH.  Diffusely, mild congestion throughout stomach without varices.  No biopsies.  Presented to triage yesterday midday.  Complaining of weakness/fatigue, anorexia, back pain, abdominal pain.  Yesterday's notes mention that he is consuming alcohol on a daily basis w last consumption 1/27.  However, his brother with whom he lives, is not convinced the patient is drinking.  The last time he saw a beer can was a month ago.   Brother says for 6 weeks patient's been headed downhill with loss of appetite, weakness.  For about a week he has not really eaten or  drink anything and he vomits when he tries to consume liquids or solids.  He complains of pain in his foot, neck, stomach, back.  May occasionally take ibuprofen but no BC or Goody's powders.  Complains of abdominal bloating and occasional constipation despite taking lactulose and all of his other listed meds.  He is gotten progressively weak with difficulty holding himself up or walking for at least a week.  T bili 6.9 ... 5.7.  Alk phos 77.  AST/ALT 206/109 ..  165/95 BUN/creatinine normal.  Na 121 .Marland Kitchen 127.  Lipase 54.  Ammonia 41. Hgb 10.4, MCV 87.  Platelets 144.  WBCs normal. INR 1.3.  EtOH <10.   Hepatitis A IgM, hepatitis B surface Ag, hepatitis B surface AG, hepatitis B core IgM, HCV Ab all negative. CTAP w contrast significant progression of hepatic steatosis consistent with acute hepatitis, no evidence for hepatic neoplasm.  Increase in mild, diffuse mesenteric edema.  Minimal ascites.  Cholelithiasis, subcentimeter calcified gallstone.  Normal intra and extrahepatic biliary ducts.    Past Medical History:  Diagnosis Date   Abnormal nuclear cardiac imaging test    Acid reflux    Acute pancreatitis 08/14/2018   Alcohol withdrawal delirium ([place]) 12/22/2246   Alcoholic cardiomyopathy ([place]) 11/08/35   Alcoholic hepatitis  Alcoholic ketoacidosis 2/63/7858   Ascites 12/13/2019   Aspiration pneumonia ([place]) 08/20/2016   Chest pain of uncertain etiology    Chronic systolic CHF (congestive heart failure) ([place])    Cirrhosis (HCC)    Colon cancer ([place])    DCM (dilated cardiomyopathy) ([place])    Drug abuse ([place]) 01/07/2020   Elevated troponin 06/27/2018   ETOH abuse    Gastropathy 08/14/2018   Heme positive stool 11/13/2017   Hepatic steatosis 08/21/2016   High anion gap metabolic acidosis 05/07/276   History of colon cancer    HTN (hypertension)    Hypertensive urgency 06/27/2018   Hypoglycemia 06/27/2018   Hypokalemia 01/07/2020   Hypomagnesemia    Hypophosphatemia    Lactic acidosis  08/20/2016   Leukocytosis 01/07/2020   Pancreatitis 08/2018   Polyp of ascending colon    Prolonged Q-T interval on ECG    Prolonged QT interval    Protein-calorie malnutrition, severe 05/02/2018   PUD (peptic ulcer disease)    SBP (spontaneous bacterial peritonitis) ([place]) 01/07/2020   Sepsis ([place]) 08/20/2016   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   SVT (supraventricular tachycardia) ([place])    Thrombocytopenia ([place]) 08/21/2016    Past Surgical History:  Procedure Laterality Date   BIOPSY  12/14/2019   Procedure: BIOPSY;  Surgeon: Mauri Pole, MD;  Location: [facility];  Service: Endoscopy;;   catherization  2007   COLONOSCOPY WITH PROPOFOL N/A 12/14/2019   Procedure: COLONOSCOPY WITH PROPOFOL;  Surgeon: Mauri Pole, MD;  Location: [facility];  Service: Endoscopy;  Laterality: N/A;   ESOPHAGOGASTRODUODENOSCOPY (EGD) WITH PROPOFOL N/A 12/14/2019   Procedure: ESOPHAGOGASTRODUODENOSCOPY (EGD) WITH PROPOFOL;  Surgeon: Mauri Pole, MD;  Location: [facility];  Service: Endoscopy;  Laterality: N/A;   HERNIA REPAIR  1969   1 x at birth and at 55 years old   [place]  2007   OPEN REDUCTION INTERNAL FIXATION (ORIF) HAND Right 2012   3rd  digit   POLYPECTOMY  12/14/2019   Procedure: POLYPECTOMY;  Surgeon: Mauri Pole, MD;  Location: [facility];  Service: Endoscopy;;    Prior to Admission medications   Medication Sig Start Date End Date Taking? Authorizing Provider  acetaminophen-codeine (TYLENOL #3) 300-30 MG tablet Take 1 tablet by mouth every 12 (twelve) hours as needed for moderate pain. 10/28/21  Yes Charlott Rakes, MD  aspirin 81 MG EC tablet Take 1 tablet (81 mg total) by mouth daily. 06/16/20  Yes Fulp, Cammie, MD  Cholecalciferol (VITAMIN D-3) 125 MCG (5000 UT) TABS Take 2 PO qd x 3 months, then 1 PO qd long-term after that Patient taking differently: Take 1 tablet by mouth daily. 11/11/20  Yes Hilts, Legrand Como, MD  cyclobenzaprine  (FLEXERIL) 10 MG tablet Take 1 tablet (10 mg total) by mouth at bedtime. 07/20/21 07/20/22 Yes Newlin, Charlane Ferretti, MD  diclofenac Sodium (VOLTAREN) 1 % GEL APPLY 2 G TOPICALLY 4 (FOUR) TIMES DAILY. Patient taking differently: Apply 2 g topically 4 (four) times daily as needed (for pain). 06/11/21 06/11/22 Yes Newlin, Charlane Ferretti, MD  DULoxetine (CYMBALTA) 60 MG capsule TAKE 1 CAPSULE (60 MG TOTAL) BY MOUTH DAILY. Patient taking differently: Take 60 mg by mouth daily. 08/05/21 08/05/22 Yes McClung, Dionne Bucy, PA-C  ferrous gluconate (FERGON) 324 MG tablet Take 1 tablet (324 mg total) by mouth daily with breakfast. 11/11/20  Yes Hilts, Legrand Como, MD  furosemide (LASIX) 40 MG tablet TAKE 1 TABLET (40 MG TOTAL) BY MOUTH DAILY. Patient taking differently: Take 40 mg by mouth daily.  10/26/21 10/26/22 Yes Newlin, Charlane Ferretti, MD  gabapentin (NEURONTIN) 300 MG capsule TAKE 2 CAPSULES (600 MG TOTAL) BY MOUTH 2 (TWO) TIMES DAILY. Patient taking differently: Take 300 mg by mouth 2 (two) times daily. 10/26/21 10/26/22 Yes Newlin, Charlane Ferretti, MD  lactulose, encephalopathy, (CHRONULAC) 10 GM/15ML SOLN TAKE 30 ML BY MOUTH TWICE DAILY Patient taking differently: Take 30 g by mouth 2 (two) times daily. 10/11/21  Yes Newlin, Charlane Ferretti, MD  lidocaine (LIDODERM) 5 % Place 1 patch onto the skin daily. Remove & discard patch within 12 hours or as directed by MD 03/03/21 03/03/22 Yes Charlott Rakes, MD  magnesium oxide (MAG-OX) 400 (241.3 Mg) MG tablet Take 1 tablet (400 mg total) by mouth daily. 06/16/20  Yes Fulp, Cammie, MD  metoprolol succinate (TOPROL-XL) 25 MG 24 hr tablet TAKE 1 TABLET (25 MG TOTAL) BY MOUTH DAILY. Patient taking differently: Take 25 mg by mouth daily. 03/03/21 03/03/22 Yes Newlin, Charlane Ferretti, MD  pantoprazole (PROTONIX) 40 MG tablet Take 1 tablet (40 mg total) by mouth daily. 10/26/21 01/24/22 Yes Newlin, Charlane Ferretti, MD  acetaminophen-codeine (TYLENOL #3) 300-30 MG tablet Take 1 tablet by mouth every 12 (twelve) hours as needed for moderate  pain. Patient not taking: Reported on 10/31/2021 06/30/21   Argentina Donovan, PA-C  furosemide (LASIX) 40 MG tablet TAKE 1 TABLET(40 MG) BY MOUTH DAILY Patient taking differently: Take 40 mg by mouth daily. 07/19/21   Charlott Rakes, MD  Hyoscyamine Sulfate SL 0.125 MG SUBL PLACE 1 TABLET (0.125 MG TOTAL) UNDER THE TONGUE EVERY 8 (EIGHT) HOURS AS NEEDED. Patient taking differently: Place 1 tablet under the tongue daily as needed (IBS). 12/14/20 12/14/21  Zehr, Laban Emperor, PA-C  spironolactone (ALDACTONE) 100 MG tablet Take 1 tablet (100 mg total) by mouth daily. 04/20/21 07/19/21  Charlott Rakes, MD    Scheduled Meds:  folic acid  1 mg Oral Daily   lactulose  20 g Oral BID   multivitamin with minerals  1 tablet Oral Daily   pantoprazole (PROTONIX) IV  40 mg Intravenous Q12H   thiamine  100 mg Oral Daily   Or   thiamine  100 mg Intravenous Daily   Infusions:  sodium chloride 75 mL/hr at 11/01/21 0528   PRN Meds: bisacodyl, fentaNYL (SUBLIMAZE) injection, LORazepam **OR** LORazepam, oxyCODONE   Allergies as of 10/31/2021 - Review Complete 10/31/2021  Allergen Reaction Noted   Aspirin Other (See Comments) 08/20/2016   Aspirin Other (See Comments) 04/03/2021   Penicillins Hives 01/09/2012   Penicillins Hives 04/03/2021    Family History  Problem Relation Age of Onset   Colon cancer Father    Cancer Sister        type unknown   Kidney disease Sister        dialysis   Colon cancer Cousin        x 2   CAD Neg Hx    Stroke Neg Hx    Diabetes Neg Hx     Social History   Socioeconomic History   Marital status: Single    Spouse name: Not on file   Number of children: Not on file   Years of education: Not on file   Highest education level: Not on file  Occupational History   Not on file  Tobacco Use   Smoking status: Every Day    Packs/day: 1.00    Types: Cigarettes   Smokeless tobacco: Never  Vaping Use   Vaping Use: Never used  Substance and Sexual Activity    Alcohol  use: Not Currently    Alcohol/week: 2.0 standard drinks    Types: 1 Cans of beer, 1 Shots of liquor per week    Comment: last drink in February   Drug use: Yes    Types: Marijuana   Sexual activity: Not Currently  Other Topics Concern   Not on file  Social History Narrative   Not on file   Social Determinants of Health   Financial Resource Strain: Not on file  Food Insecurity: Not on file  Transportation Needs: Not on file  Physical Activity: Not on file  Stress: Not on file  Social Connections: Not on file  Intimate Partner Violence: Not on file    REVIEW OF SYSTEMS: Not able to obtain due to pt somnolence and not being verbal currently   PHYSICAL EXAM: Vital signs in last 24 hours: Vitals:   11/01/21 0400 11/01/21 0801  BP: (!) 88/61 102/68  Pulse: (!) 53 (!) 56  Resp: 15 15  Temp: (!) 97.4 F (36.3 C) (!) 97.4 F (36.3 C)  SpO2: 100% 100%   Wt Readings from Last 3 Encounters:  10/31/21 45.2 kg  08/05/21 53.6 kg  05/03/21 53.3 kg    General: cachectic, severely chronically ill-appearing.  Unable to give me any history. Head: No signs of head trauma. Eyes: Conjunctival pallor, scleral icterus Ears: Does not appear to be hard of hearing Nose: No congestion or discharge Mouth: No teeth.  Mucosa moist and clear but pale.  Tongue midline.  No exudates or masses. Neck: No JVD, no masses Lungs: No labored breathing.  No cough.  Decreased breath sounds due to poor inspiratory effort but clear. Heart: RRR.  No MRG.  S1, S2 present Abdomen: Soft, thin.  No masses.  No HSM.  Slight tenderness in the epigastric region without guarding or rebound.  Bowel sounds active.  Firm scar tissue at midline incision. Rectal: Not performed. Musc/Skeltl: Muscle wasting in arms and legs. Extremities: No CCE. Neurologic: Not verbal.  Does follow some commands and moves all 4 limbs but strength was not tested.  No asterixis elicited. Skin: No sores, no rashes, no suspicious  lesions on incomplete survey. Nodes: No cervical adenopathy Psych: Somnolent, not verbal though apparently was speaking a little bit earlier today.  Intake/Output from previous day: 01/29 0701 - 01/30 0700 In: 100 [IV Piggyback:100] Out: -  Intake/Output this shift: Total I/O In: 150 [P.O.:150] Out: -   LAB RESULTS: Recent Labs    10/31/21 1426 11/01/21 0514  WBC 6.8 5.0  HGB 10.5* 10.4*  HCT 28.2* 29.2*  PLT 144* PLATELET CLUMPS NOTED ON SMEAR, UNABLE TO ESTIMATE   BMET Lab Results  Component Value Date   NA 127 (L) 11/01/2021   NA 127 (L) 11/01/2021   NA 125 (L) 11/01/2021   K 4.4 11/01/2021   K 4.5 11/01/2021   K 3.1 (L) 11/01/2021   CL 87 (L) 11/01/2021   CL 87 (L) 11/01/2021   CL 82 (L) 11/01/2021   CO2 28 11/01/2021   CO2 29 11/01/2021   CO2 28 11/01/2021   GLUCOSE 87 11/01/2021   GLUCOSE 88 11/01/2021   GLUCOSE 86 11/01/2021   BUN 6 11/01/2021   BUN 6 11/01/2021   BUN 6 11/01/2021   CREATININE 0.66 11/01/2021   CREATININE 0.70 11/01/2021   CREATININE 0.73 11/01/2021   CALCIUM 8.8 (L) 11/01/2021   CALCIUM 8.9 11/01/2021   CALCIUM 8.4 (L) 11/01/2021   LFT Recent Labs    10/31/21 1426 10/31/21  2207 11/01/21 0514  PROT 7.4  --  6.4*  ALBUMIN 3.2*  --  2.8*  AST 206*  --  165*  ALT 109*  --  95*  ALKPHOS 77  --  77  BILITOT 6.9* 5.9* 5.7*  BILIDIR  --  3.0*  --   IBILI  --  2.9*  --    PT/INR Lab Results  Component Value Date   INR 1.3 (H) 11/01/2021   INR 1.3 (H) 10/31/2021   INR 1.4 (H) 04/04/2021   Hepatitis Panel Recent Labs    10/31/21 2207  HEPBSAG NON REACTIVE  HCVAB NON REACTIVE  HEPAIGM NON REACTIVE  HEPBIGM NON REACTIVE   C-Diff No components found for: CDIFF Lipase     Component Value Date/Time   LIPASE 54 (H) 10/31/2021 1426    Drugs of Abuse     Component Value Date/Time   LABOPIA NONE DETECTED 01/07/2020 0823   COCAINSCRNUR NONE DETECTED 01/07/2020 0823   LABBENZ NONE DETECTED 01/07/2020 0823   AMPHETMU  NONE DETECTED 01/07/2020 0823   THCU POSITIVE (A) 01/07/2020 0823   LABBARB NONE DETECTED 01/07/2020 0823     RADIOLOGY STUDIES: DG Cervical Spine 2 or 3 views  Result Date: 10/31/2021 CLINICAL DATA:  Pain EXAM: CERVICAL SPINE - 2-3 VIEW COMPARISON:  April 03, 2021 FINDINGS: The cervical spine is visualized from C1-the superior endplate of C7. Cervical alignment is maintained. Vertebral body heights are maintained: no evidence of acute fracture. Intervertebral spaces are maintained without significant degenerative changes. No prevertebral soft tissue swelling. Visualized thorax is unremarkable. Atherosclerotic calcifications of the carotid arteries. IMPRESSION: No acute osseous abnormality. If persistent concern, recommend dedicated cross-sectional imaging. Electronically Signed   By: Valentino Saxon M.D.   On: 10/31/2021 19:04   CT ABDOMEN PELVIS W CONTRAST  Result Date: 10/31/2021 CLINICAL DATA:  Acute hepatitis. Weakness and anorexia. Alcohol abuse. EXAM: CT ABDOMEN AND PELVIS WITH CONTRAST TECHNIQUE: Multidetector CT imaging of the abdomen and pelvis was performed using the standard protocol following bolus administration of intravenous contrast. RADIATION DOSE REDUCTION: This exam was performed according to the departmental dose-optimization program which includes automated exposure control, adjustment of the mA and/or kV according to patient size and/or use of iterative reconstruction technique. CONTRAST:  167m OMNIPAQUE IOHEXOL 300 MG/ML  SOLN COMPARISON:  04/03/2021 FINDINGS: Lower Chest: No acute findings. Hepatobiliary: Significant worsening geographic pattern of steatosis is seen throughout the liver, consistent with history of acute hepatitis. No hepatic mass identified. A tiny less than 1 cm calcified gallstone is noted, however there is no evidence of acute cholecystitis or biliary ductal dilatation. Pancreas:  No mass or inflammatory changes. Spleen: Within normal limits in size and  appearance. Adrenals/Urinary Tract: No masses identified. No evidence of ureteral calculi or hydronephrosis. Stomach/Bowel: No evidence of obstruction, inflammatory process or abnormal fluid collections. Vascular/Lymphatic: No pathologically enlarged lymph nodes. No acute vascular findings. Aortic atherosclerotic calcification noted. Reproductive:  No mass or other significant abnormality. Other: Mild diffuse mesenteric edema and minimal ascites in the right perihepatic space have increased since prior exam. Musculoskeletal:  No suspicious bone lesions identified. IMPRESSION: Significant worsening of geographic pattern of steatosis throughout the liver, consistent with history of acute hepatitis. No evidence of hepatic neoplasm. Increased mild diffuse mesenteric edema and minimal ascites. Cholelithiasis. No radiographic evidence of cholecystitis. Aortic Atherosclerosis (ICD10-I70.0). Electronically Signed   By: JMarlaine HindM.D.   On: 10/31/2021 16:57     IMPRESSION:   Alcoholic hepatitis.  Discriminant function score of 17  based on yesterday's labs, does not meet criteria for prednisolone.  Fatty liver w no cirrhosis based on current [place].    Alcohol use disorder.  Varying history as to recent alcohol use.    AMS.  Ammonia level is 41.  No asterixis on exam but significant somnolence.  Only potential med contributing to this was 5 mg oxycodone at midnight.     Hyponatremia.  Mild elevation of lipase at 54.  No evidence acute pancreatitis or sequela on current CT but has history of alcoholic pancreatitis.   Normocytic anemia.  Hx IDA with evaluation of colon/Endo in 12/2019.  Other reports patient is compliant with meds including Protonix 40 mg daily.  Minor ascites, mesenteric edema, abdominal pain.  Not confirmed that he has been compliant with Aldactone 100 mg daily, but has confirmed he takes Lasix 40 mg daily.     Thrombocytopenia.  Platelets 144K.  Not a new problem.  Coagulopathy, INR  1.3.    Hx colon cancer and adenomatous colon polyps.  L Colectomy  2007.  Small adenoma on colonoscopy 12/2019.  Repeat surveillance due 2026.     PLAN:     Supportive care.  ?  Need for head imaging?   ?  Need for repeat EGD?      Azucena Freed  11/01/2021, 10:22 AM Phone [phone]

## 2021-11-02 LAB — HEPATIC FUNCTION PANEL
ALT: 75 U/L — ABNORMAL HIGH (ref 0–44)
AST: 138 U/L — ABNORMAL HIGH (ref 15–41)
Albumin: 3.2 g/dL — ABNORMAL LOW (ref 3.5–5.0)
Alkaline Phosphatase: 66 U/L (ref 38–126)
Bilirubin, Direct: 1.8 mg/dL — ABNORMAL HIGH (ref 0.0–0.2)
Indirect Bilirubin: 1.6 mg/dL — ABNORMAL HIGH (ref 0.3–0.9)
Total Bilirubin: 3.4 mg/dL — ABNORMAL HIGH (ref 0.3–1.2)
Total Protein: 5.7 g/dL — ABNORMAL LOW (ref 6.5–8.1)

## 2021-11-02 LAB — GLUCOSE, CAPILLARY
Glucose-Capillary: 89 mg/dL (ref 70–99)
Glucose-Capillary: 124 mg/dL — ABNORMAL HIGH (ref 70–99)
Glucose-Capillary: 120 mg/dL — ABNORMAL HIGH (ref 70–99)
Glucose-Capillary: 101 mg/dL — ABNORMAL HIGH (ref 70–99)
Glucose-Capillary: 106 mg/dL — ABNORMAL HIGH (ref 70–99)

## 2021-11-02 LAB — BASIC METABOLIC PANEL
Anion gap: 9 (ref 5–15)
BUN: <5 mg/dL — ABNORMAL LOW (ref 6–20)
CO2: 27 mmol/L (ref 22–32)
Calcium: 9.3 mg/dL (ref 8.9–10.3)
Chloride: 95 mmol/L — ABNORMAL LOW (ref 98–111)
Creatinine, Ser: 0.61 mg/dL (ref 0.61–1.24)
GFR, Estimated: >60 mL/min (ref >60)
Glucose, Bld: 89 mg/dL (ref 70–99)
Potassium: 3.4 mmol/L — ABNORMAL LOW (ref 3.5–5.1)
Sodium: 131 mmol/L — ABNORMAL LOW (ref 135–145)

## 2021-11-02 LAB — CBC
HCT: 21.2 % — ABNORMAL LOW (ref 39.0–52.0)
Hemoglobin: 7.5 g/dL — ABNORMAL LOW (ref 13.0–17.0)
MCH: 31.3 pg (ref 26.0–34.0)
MCHC: 35.4 g/dL (ref 30.0–36.0)
MCV: 88.3 fL (ref 80.0–100.0)
Platelets: 89 10*3/uL — ABNORMAL LOW (ref 150–400)
RBC: 2.4 MIL/uL — ABNORMAL LOW (ref 4.22–5.81)
RDW: 19 % — ABNORMAL HIGH (ref 11.5–15.5)
WBC: 5.4 10*3/uL (ref 4.0–10.5)
nRBC: 0.9 % — ABNORMAL HIGH (ref 0.0–0.2)

## 2021-11-02 MED ORDER — POTASSIUM CHLORIDE CRYS ER 20 MEQ PO TBCR
40.0000 meq | EXTENDED_RELEASE_TABLET | Freq: Once | ORAL | Status: AC
  Administered 2021-11-02: 40 meq via ORAL
  Filled 2021-11-02: qty 2

## 2021-11-02 MED ORDER — OXYCODONE HCL 5 MG PO TABS: 5.0000 mg | ORAL_TABLET | Freq: Four times a day (QID) | ORAL | Status: DC | PRN

## 2021-11-02 MED ORDER — ENSURE ENLIVE PO LIQD
237.0000 mL | Freq: Three times a day (TID) | ORAL | Status: DC
  Administered 2021-11-02 – 2021-11-05 (×4): 237 mL via ORAL

## 2021-11-02 NOTE — Evaluation (Signed)
Physical Therapy Evaluation Patient Details Name: Richard [name] MRN: [id number] DOB: 09-25-1966 Today's Date: 11/02/2021  History of Present Illness  Richard [name] is a 56 y.o. male past medical history significant for alcohol use, cirrhosis of the liver, alcoholic hepatitis, alcoholic cardiomyopathy, hypertension, hyponatremia (sodium level 129) 4 months ago, pancreatitis, colon cancer who presents complaining of generalized weakness, fatigue, anorexia for the last 2 weeks.  He relates is related to his back pain has been having so much pain that he is not able to eat.  He also reports abdominal pain.   Clinical Impression  Patient received in bed, lethargic. Agrees to PT assessment. Appears much older than stated age. Patient is mod independent with bed mobility. Use of bed rail, increased time and effort to get seated on edge of bed. Performed sit to stand with mod independence. Patient ambulated 25 feet in room with RW and min guard. He is slightly unsteady with mobility and weak. Patient is at increased fall risk. He will continue to benefit from skilled PT while here to improve strength and independence for safe return home.           Recommendations for follow up therapy are one component of a multi-disciplinary discharge planning process, led by the attending physician.  Recommendations may be updated based on patient status, additional functional criteria and insurance authorization.  Follow Up Recommendations Home health PT    Assistance Recommended at Discharge Intermittent Supervision/Assistance  Patient can return home with the following  A little help with walking and/or transfers;Assistance with cooking/housework;Assist for transportation;Direct supervision/assist for medications management    Equipment Recommendations Rolling walker (2 wheels)  Recommendations for Other Services       Functional Status Assessment Patient has had a recent decline in their  functional status and demonstrates the ability to make significant improvements in function in a reasonable and predictable amount of time.     Precautions / Restrictions Precautions Precautions: Fall Restrictions Weight Bearing Restrictions: No      Mobility  Bed Mobility Overal bed mobility: Modified Independent             General bed mobility comments: increased time. Use of bed rail. No physical assist needed    Transfers Overall transfer level: Modified independent Equipment used: Rolling walker (2 wheels)               General transfer comment: no physical assist needed, able to power up to standing with supervision    Ambulation/Gait Ambulation/Gait assistance: Min guard Gait Distance (Feet): 25 Feet Assistive device: Rolling walker (2 wheels) Gait Pattern/deviations: Step-through pattern, Decreased step length - right, Decreased step length - left, Narrow base of support, Decreased stride length Gait velocity: decreased     General Gait Details: patient slow, weak, requires min guard and RW for safety. Reports mild light- headedness with activity.  Stairs            Wheelchair Mobility    Modified Rankin (Stroke Patients Only)       Balance Overall balance assessment: Mild deficits observed, not formally tested, History of Falls, Needs assistance Sitting-balance support: Feet supported Sitting balance-Leahy Scale: Good     Standing balance support: Bilateral upper extremity supported, During functional activity, Reliant on assistive device for balance Standing balance-Leahy Scale: Fair Standing balance comment: generally weak and slightly unsteady. Benefits from B UE support at this time  Pertinent Vitals/Pain Pain Assessment Pain Assessment: Faces Faces Pain Scale: Hurts a little bit Pain Location: abdomen Pain Intervention(s): Monitored during session    [name] expects  to be discharged to:: Private residence Living Arrangements: Other relatives Available Help at Discharge: Family;Available PRN/intermittently Type of Home: Apartment Home Access: Level entry       Home Layout: One level Home Equipment: Cane - single point      Prior Function Prior Level of Function : Independent/Modified Independent;History of Falls (last six months)             Mobility Comments: patient states he ambulates at baseline with cane. Reports one recent fall getting out of shower ADLs Comments: independent at baseline, family members usually do cooking     Hand Dominance   Dominant Hand: Right    Extremity/Trunk Assessment   Upper Extremity Assessment Upper Extremity Assessment: Generalized weakness    Lower Extremity Assessment Lower Extremity Assessment: Generalized weakness    Cervical / Trunk Assessment Cervical / Trunk Assessment:  (forward head)  Communication   Communication: No difficulties  Cognition Arousal/Alertness: Lethargic Behavior During Therapy: WFL for tasks assessed/performed Overall Cognitive Status: Within Functional Limits for tasks assessed                                          General Comments      Exercises     Assessment/Plan    PT Assessment Patient needs continued PT services  PT Problem List Decreased strength;Decreased mobility;Decreased activity tolerance;Decreased balance;Decreased knowledge of use of DME       PT Treatment Interventions DME instruction;Therapeutic exercise;Gait training;Balance training;Functional mobility training;Therapeutic activities;Patient/family education    PT Goals (Current goals can be found in the Care Plan section)  Acute Rehab PT Goals Patient Stated Goal: to return home PT Goal Formulation: With patient Time For Goal Achievement: 11/16/21 Potential to Achieve Goals: Good    Frequency Min 2X/week     Co-evaluation               AM-PAC PT "6  Clicks" Mobility  Outcome Measure Help needed turning from your back to your side while in a flat bed without using bedrails?: None Help needed moving from lying on your back to sitting on the side of a flat bed without using bedrails?: A Little Help needed moving to and from a bed to a chair (including a wheelchair)?: A Little Help needed standing up from a chair using your arms (e.g., wheelchair or bedside chair)?: A Little Help needed to walk in hospital room?: A Little Help needed climbing 3-5 steps with a railing? : A Lot 6 Click Score: 18    End of Session Equipment Utilized During Treatment: Gait belt Activity Tolerance: Patient limited by lethargy Patient left: in bed;with call bell/phone within reach;with bed alarm set Nurse Communication: Mobility status PT Visit Diagnosis: Unsteadiness on feet (R26.81);History of falling (Z91.81);Muscle weakness (generalized) (M62.81);Difficulty in walking, not elsewhere classified (R26.2)    Time: 7262-0355 PT Time Calculation (min) (ACUTE ONLY): 21 min   Charges:   PT Evaluation $PT Eval Moderate Complexity: 1 Mod PT Treatments $Gait Training: 8-22 mins        [employer], PT, GCS 11/02/21,12:02 PM

## 2021-11-02 NOTE — Progress Notes (Signed)
Progress Note   Subjective  Chief Complaint: ETOH hepatitis  Patient without nausea, vomiting. Patient need to have bowel movement while sunroom, left and came back, states he was feeling better after this.  Denies melena hematochezia. States abdomen softer after bowel movement. Patient is aware of where he is at, time, but unable to tell me why he came to the hospital.    Objective   Vital signs in last 24 hours: Temp:  [96.8 F (36 C)-98.5 F (36.9 C)] 98.1 F (36.7 C) (01/31 1124) Pulse Rate:  [63-86] 86 (01/31 1124) Resp:  [17-20] 17 (01/31 1124) BP: (82-101)/(58-68) 101/68 (01/31 1124) SpO2:  [96 %-100 %] 100 % (01/31 1124) Last BM Date: 11/02/21  General: Cachectic, chronically ill-appearing male Heart:  regular rate and rhythm Pulm: Clear anteriorly; no wheezing Abdomen:  Soft,  distended (improved with BM)  AB, large firm midline incision Normal bowel sounds. mild tenderness in the epigastrium. Without guarding and Without rebound,  Extremities:  Without edema. Neurologic: Patient aware of where he is, month and year, unaware as to why he is here though grossly normal neurologically.  Skin:   Dry and intact without significant lesions or rashes. Psychiatric: Patient more alert, still speaking short sentences, flat affect.  Intake/Output from previous day: 01/30 0701 - 01/31 0700 In: 2943.2 [P.O.:500; I.V.:2187.3; IV Piggyback:255.9] Out: 1025 [Urine:1025] Intake/Output this shift: Total I/O In: 840 [P.O.:840] Out: 400 [Urine:400]  Lab Results: Recent Labs    10/31/21 1426 11/01/21 0514 11/02/21 0442  WBC 6.8 5.0 5.4  HGB 10.5* 10.4* 7.5*  HCT 28.2* 29.2* 21.2*  PLT 144* PLATELET CLUMPS NOTED ON SMEAR, UNABLE TO ESTIMATE 89*   BMET Recent Labs    11/01/21 0514 11/01/21 1249 11/02/21 0219  NA 127*   127* 129* 131*  K 4.4   4.5 3.6 3.4*  CL 87*   87* 91* 95*  CO2 28   29 29 27   GLUCOSE 87   88 88 89  BUN 6   6 5* <5*  CREATININE 0.66   0.70  0.55* 0.61  CALCIUM 8.8*   8.9 8.7* 9.3   LFT Recent Labs    11/02/21 0219  PROT 5.7*  ALBUMIN 3.2*  AST 138*  ALT 75*  ALKPHOS 66  BILITOT 3.4*  BILIDIR 1.8*  IBILI 1.6*   PT/INR Recent Labs    10/31/21 2207 11/01/21 0514  LABPROT 15.8* 15.7*  INR 1.3* 1.3*    Studies/Results: DG Cervical Spine 2 or 3 views  Result Date: 10/31/2021 CLINICAL DATA:  Pain EXAM: CERVICAL SPINE - 2-3 VIEW COMPARISON:  April 03, 2021 FINDINGS: The cervical spine is visualized from C1-the superior endplate of C7. Cervical alignment is maintained. Vertebral body heights are maintained: no evidence of acute fracture. Intervertebral spaces are maintained without significant degenerative changes. No prevertebral soft tissue swelling. Visualized thorax is unremarkable. Atherosclerotic calcifications of the carotid arteries. IMPRESSION: No acute osseous abnormality. If persistent concern, recommend dedicated cross-sectional imaging. Electronically Signed   By: Valentino Saxon M.D.   On: 10/31/2021 19:04   CT ABDOMEN PELVIS W CONTRAST  Result Date: 10/31/2021 CLINICAL DATA:  Acute hepatitis. Weakness and anorexia. Alcohol abuse. EXAM: CT ABDOMEN AND PELVIS WITH CONTRAST TECHNIQUE: Multidetector CT imaging of the abdomen and pelvis was performed using the standard protocol following bolus administration of intravenous contrast. RADIATION DOSE REDUCTION: This exam was performed according to the departmental dose-optimization program which includes automated exposure control, adjustment of the mA and/or kV according to  patient size and/or use of iterative reconstruction technique. CONTRAST:  168mL OMNIPAQUE IOHEXOL 300 MG/ML  SOLN COMPARISON:  04/03/2021 FINDINGS: Lower Chest: No acute findings. Hepatobiliary: Significant worsening geographic pattern of steatosis is seen throughout the liver, consistent with history of acute hepatitis. No hepatic mass identified. A tiny less than 1 cm calcified gallstone is noted,  however there is no evidence of acute cholecystitis or biliary ductal dilatation. Pancreas:  No mass or inflammatory changes. Spleen: Within normal limits in size and appearance. Adrenals/Urinary Tract: No masses identified. No evidence of ureteral calculi or hydronephrosis. Stomach/Bowel: No evidence of obstruction, inflammatory process or abnormal fluid collections. Vascular/Lymphatic: No pathologically enlarged lymph nodes. No acute vascular findings. Aortic atherosclerotic calcification noted. Reproductive:  No mass or other significant abnormality. Other: Mild diffuse mesenteric edema and minimal ascites in the right perihepatic space have increased since prior exam. Musculoskeletal:  No suspicious bone lesions identified. IMPRESSION: Significant worsening of geographic pattern of steatosis throughout the liver, consistent with history of acute hepatitis. No evidence of hepatic neoplasm. Increased mild diffuse mesenteric edema and minimal ascites. Cholelithiasis. No radiographic evidence of cholecystitis. Aortic Atherosclerosis (ICD10-I70.0). Electronically Signed   By: Marlaine Hind M.D.   On: 10/31/2021 16:57   DG CHEST PORT 1 VIEW  Result Date: 11/01/2021 CLINICAL DATA:  Altered mental status. EXAM: PORTABLE CHEST 1 VIEW COMPARISON:  Chest x-ray 04/03/2021. FINDINGS: Cardiomediastinal silhouette is within normal limits. Central interstitial prominence bilaterally. Minimal strandy opacities in the lung bases. Costophrenic angles are clear. No pneumothorax or acute fracture. IMPRESSION: 1. Central interstitial prominence may be related to mild edema or infection/inflammation. 2. Minimal bibasilar opacities favored as atelectasis. Electronically Signed   By: Ronney Asters M.D.   On: 11/01/2021 21:59   ECHOCARDIOGRAM COMPLETE  Result Date: 11/01/2021    ECHOCARDIOGRAM REPORT   Patient Name:   Richard [name] Date of Exam: 11/01/2021 Medical Rec #:  [id number]           Height:       72.0 in Accession #:     [id number]          Weight:       99.6 lb Date of Birth:  09/07/1966           BSA:          1.585 m Patient Age:    56 years            BP:           87/59 mmHg Patient Gender: M                   HR:           52 bpm. Exam Location:  Inpatient Procedure: 2D Echo, Color Doppler and Cardiac Doppler Indications:    R94.31 Abnormal EKG  History:        Patient has prior history of Echocardiogram examinations, most                 recent 01/07/2020. CHF; Risk Factors:Hypertension and ETOH Abuse.  Sonographer:    Raquel Sarna Senior RDCS Referring Phys: [phone] BELKYS A REGALADO  Sonographer Comments: Technically difficult due to thin body habitus IMPRESSIONS  1. Left ventricular ejection fraction, by estimation, is 60 to 65%. The left ventricle has normal function. The left ventricle has no regional wall motion abnormalities. Left ventricular diastolic parameters were normal.  2. Right ventricular systolic function is normal. The right ventricular size is normal. Tricuspid regurgitation signal is  inadequate for assessing PA pressure.  3. The mitral valve is normal in structure. No evidence of mitral valve regurgitation. No evidence of mitral stenosis.  4. The aortic valve is normal in structure. Aortic valve regurgitation is not visualized. No aortic stenosis is present.  5. The inferior vena cava is normal in size with greater than 50% respiratory variability, suggesting right atrial pressure of 3 mmHg. Comparison(s): A prior study was performed on 01/07/2020. No prior Echocardiogram. FINDINGS  Left Ventricle: Left ventricular ejection fraction, by estimation, is 60 to 65%. The left ventricle has normal function. The left ventricle has no regional wall motion abnormalities. The left ventricular internal cavity size was normal in size. There is  no left ventricular hypertrophy. Left ventricular diastolic parameters were normal. Right Ventricle: The right ventricular size is normal. No increase in right ventricular wall thickness.  Right ventricular systolic function is normal. Tricuspid regurgitation signal is inadequate for assessing PA pressure. Left Atrium: Left atrial size was normal in size. Right Atrium: Right atrial size was normal in size. Pericardium: There is no evidence of pericardial effusion. Mitral Valve: The mitral valve is normal in structure. No evidence of mitral valve regurgitation. No evidence of mitral valve stenosis. Tricuspid Valve: The tricuspid valve is normal in structure. Tricuspid valve regurgitation is trivial. No evidence of tricuspid stenosis. Aortic Valve: The aortic valve is normal in structure. Aortic valve regurgitation is not visualized. No aortic stenosis is present. Pulmonic Valve: The pulmonic valve was normal in structure. Pulmonic valve regurgitation is not visualized. No evidence of pulmonic stenosis. Aorta: The ascending aorta was not well visualized and the aortic root is normal in size and structure. Venous: The inferior vena cava is normal in size with greater than 50% respiratory variability, suggesting right atrial pressure of 3 mmHg. IAS/Shunts: No atrial level shunt detected by color flow Doppler.  LEFT VENTRICLE PLAX 2D LVIDd:         4.05 cm   Diastology LVIDs:         3.30 cm   LV e' medial:    11.50 cm/s LV PW:         0.70 cm   LV E/e' medial:  5.4 LV IVS:        0.65 cm   LV e' lateral:   11.00 cm/s LVOT diam:     2.00 cm   LV E/e' lateral: 5.7 LV SV:         58 LV SV Index:   36 LVOT Area:     3.14 cm  RIGHT VENTRICLE RV S prime:     9.36 cm/s TAPSE (M-mode): 1.8 cm LEFT ATRIUM             Index        RIGHT ATRIUM           Index LA diam:        2.20 cm 1.39 cm/m   RA Area:     12.00 cm LA Vol (A2C):   32.1 ml 20.26 ml/m  RA Volume:   26.10 ml  16.47 ml/m LA Vol (A4C):   29.0 ml 18.30 ml/m LA Biplane Vol: 31.2 ml 19.69 ml/m  AORTIC VALVE LVOT Vmax:   79.60 cm/s LVOT Vmean:  56.700 cm/s LVOT VTI:    0.184 m  AORTA Ao Root diam: 3.20 cm MITRAL VALVE MV Area (PHT): 2.30 cm     SHUNTS MV Decel Time: 330 msec    Systemic VTI:  0.18 m MV E  velocity: 62.60 cm/s  Systemic Diam: 2.00 cm MV A velocity: 60.00 cm/s MV E/A ratio:  1.04 Kardie Tobb DO Electronically signed by Berniece Salines DO Signature Date/Time: 11/01/2021/1:31:49 PM    Final       Impression/Plan:   Alcoholic hepatitis ultrasound with no cirrhosis WBC 5.4 HGB 7.5 MCV 88.3 Platelets 89 AST 138 (165) ALT 75 (95) Alkphos 66 TBili 3.4 (5.9) GFR >60  INR 1.3 MELD-Na score: 19 INR(ratio): 1.3 at 11/01/2021  Discriminant score was 17, did not meet criteria for penicillin.   -Check CBC, CMET, INR tomorrow -Alcohol Abstinence counseling discussed with patient -Monitor for withdrawal symptoms while inpatient -Empiric treatment with folic acid, multivitamin and thiamine.   Normocytic anemia with history of IDA evaluation:/Endo 12/2019 Hemoglobin 10.4, today at 7.5 No overt GI bleeding. Continue Protonix 40 mg daily ?  Need for endoscopic evaluation, repeat CBC, pending anemia panel.  Thrombocytopenia Platelets 144 currently 89  Hepatic Encephalopathy:  41- - Lactulose, 38ml titrate to 3bms per day - minimize/remove all benzos and narcotics   Hx colon cancer and adenomatous colon polyps.   L Colectomy  2007.  Small adenoma on colonoscopy 12/2019.  Repeat surveillance due 2026  Hyponatremia/hypokalemia Improving, continue replacement.    Minor ascites via CT 01/29 Continue Lasix and spironolactone    Future Appointments  Date Time Provider [place]  11/09/2021  3:10 PM Charlott Rakes, MD [facility] None      LOS: 2 days   Vladimir Crofts  11/02/2021, 2:26 PM

## 2021-11-02 NOTE — Progress Notes (Addendum)
Initial Nutrition Assessment  DOCUMENTATION CODES:   Severe malnutrition in context of chronic illness, Underweight  INTERVENTION:   Ensure Enlive po TID, each supplement provides 350 kcal and 20 grams of protein.  Continue MVI with minerals daily.  Continue regular diet.   NUTRITION DIAGNOSIS:   Severe Malnutrition related to chronic illness (hepatitis, cirrhosis) as evidenced by severe muscle depletion, severe fat depletion.  GOAL:   Patient will meet greater than or equal to 90% of their needs  MONITOR:   PO intake, Supplement acceptance, Labs  REASON FOR ASSESSMENT:   Consult Assessment of nutrition requirement/status, Diet education  ASSESSMENT:   56 yo male admitted with acute hepatitis, hyponatremia. PMH includes alcohol use, cirrhosis of the liver, alcoholic hepatitis, alcoholic cardiomyopathy, HTN, hyponatremia, pancreatitis, colon cancer, HF.  Patient was unable to provide any nutrition history. He did not answer questions that RD asked.   Currently on a regular diet. Meal intakes documented at 50-100%.  Weight history reviewed. Weight is down by 16% in the past 3 months, which is severe. Patient meets criteria for severe malnutrition with severe weight loss and severe depletion of muscle and subcutaneous fat mass.   Labs reviewed. Na 131, K 3.4 CBG: 89-124  Medications reviewed and include folic acid, lactulose, MVI with minerals, Protonix, KCl, thiamine.  NUTRITION - FOCUSED PHYSICAL EXAM:  Flowsheet Row Most Recent Value  Orbital Region Severe depletion  Upper Arm Region Severe depletion  Thoracic and Lumbar Region Severe depletion  Buccal Region Severe depletion  Temple Region Severe depletion  Clavicle Bone Region Severe depletion  Clavicle and Acromion Bone Region Severe depletion  Scapular Bone Region Severe depletion  Dorsal Hand Severe depletion  Patellar Region Severe depletion  Anterior Thigh Region Severe depletion  Posterior Calf  Region Severe depletion  Edema (RD Assessment) None  Hair Reviewed  Eyes Reviewed  Mouth Reviewed  Skin Reviewed  Nails Reviewed       Diet Order:   Diet Order             Diet regular Room service appropriate? No; Fluid consistency: Thin  Diet effective now                   EDUCATION NEEDS:   Not appropriate for education at this time  Skin:  Skin Assessment: Reviewed RN Assessment  Last BM:  1/31  Height:   Ht Readings from Last 1 Encounters:  10/31/21 6' (1.829 m)    Weight:   Wt Readings from Last 1 Encounters:  10/31/21 45.2 kg     BMI:  Body mass index is 13.51 kg/m.  Estimated Nutritional Needs:   Kcal:  1600-1800  Protein:  75-90 gm  Fluid:  1.6 L    Lucas Mallow RD, LDN, CNSC Please refer to Amion for contact information.

## 2021-11-02 NOTE — Progress Notes (Addendum)
CSW acknowledges substance use consult. CSW went to meet with pt bedside though was informed by RN that pt experiencing some confusion and likely not appropriate to discuss substance use resources at this time. RN informed CSW that pt's mother was requesting to discuss tx options. CSW provided resource list to RN for pt mother if she visits. CSW called pt's mother to discuss general resources; no answer, left message requesting return call. TOC will continue to follow.   1605:Received call back from pt's mom. She expressed interest in pt going to a residential treatment if he was agreeable. CSW explained pt would have to consent which mother understood. CSW explained some of the facilities that may be available to pt with his insurance. CSW also explained a direct hospital to treatment center transfer is not always possible if pt is agreeable but that TOC would follow up with pt and talk with him. CSW explained that printed resource list was given to the nurse in case mother comes and visits.

## 2021-11-02 NOTE — Progress Notes (Signed)
Pt is more sleepy, but cooperative, and able to follow commands. He is disoriented to time and situations. CIWA score monitor q 4 hrs. BP has been soft since RN day shift reported, BP 82/59 - 95/68 mmHg, HR 69- 75, NSR on the monitor, SPO2 98-100% on room air, afebrile. Denies pain, no headache, no nausea and vomiting.  Albumin 25%, 25 mg administered per schedule.  Urine is amber and clear total output 1,025 ml in 24 hours. We sent urine culture and UA to lab from clean condom cath. Result is pending. Pt has one large diarrhea tonight due to scheduled Lactulose. No abdominal pain. We will monitor.   Kennyth Lose, RN
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
## 2021-11-03 DIAGNOSIS — F10931 Alcohol use, unspecified with withdrawal delirium: Secondary | ICD-10-CM

## 2021-11-03 LAB — CBC
HCT: 22.6 % — ABNORMAL LOW (ref 39.0–52.0)
HCT: 26.9 % — ABNORMAL LOW (ref 39.0–52.0)
Hemoglobin: 8.2 g/dL — ABNORMAL LOW (ref 13.0–17.0)
Hemoglobin: 9.3 g/dL — ABNORMAL LOW (ref 13.0–17.0)
MCH: 31.7 pg (ref 26.0–34.0)
MCH: 30.6 pg (ref 26.0–34.0)
MCHC: 36.3 g/dL — ABNORMAL HIGH (ref 30.0–36.0)
MCHC: 34.6 g/dL (ref 30.0–36.0)
MCV: 87.3 fL (ref 80.0–100.0)
MCV: 88.5 fL (ref 80.0–100.0)
Platelets: 111 10*3/uL — ABNORMAL LOW (ref 150–400)
Platelets: 115 10*3/uL — ABNORMAL LOW (ref 150–400)
RBC: 2.59 MIL/uL — ABNORMAL LOW (ref 4.22–5.81)
RBC: 3.04 MIL/uL — ABNORMAL LOW (ref 4.22–5.81)
RDW: 19.6 % — ABNORMAL HIGH (ref 11.5–15.5)
RDW: 19.5 % — ABNORMAL HIGH (ref 11.5–15.5)
WBC: 7.1 10*3/uL (ref 4.0–10.5)
WBC: 7.8 10*3/uL (ref 4.0–10.5)
nRBC: 0.7 % — ABNORMAL HIGH (ref 0.0–0.2)
nRBC: 0.3 % — ABNORMAL HIGH (ref 0.0–0.2)

## 2021-11-03 LAB — GLUCOSE, CAPILLARY
Glucose-Capillary: 108 mg/dL — ABNORMAL HIGH (ref 70–99)
Glucose-Capillary: 111 mg/dL — ABNORMAL HIGH (ref 70–99)
Glucose-Capillary: 112 mg/dL — ABNORMAL HIGH (ref 70–99)
Glucose-Capillary: 114 mg/dL — ABNORMAL HIGH (ref 70–99)
Glucose-Capillary: 88 mg/dL (ref 70–99)
Glucose-Capillary: 98 mg/dL (ref 70–99)

## 2021-11-03 LAB — COMPREHENSIVE METABOLIC PANEL
ALT: 83 U/L — ABNORMAL HIGH (ref 0–44)
AST: 136 U/L — ABNORMAL HIGH (ref 15–41)
Albumin: 3.2 g/dL — ABNORMAL LOW (ref 3.5–5.0)
Alkaline Phosphatase: 74 U/L (ref 38–126)
Anion gap: 8 (ref 5–15)
BUN: <5 mg/dL — ABNORMAL LOW (ref 6–20)
CO2: 23 mmol/L (ref 22–32)
Calcium: 9.2 mg/dL (ref 8.9–10.3)
Chloride: 99 mmol/L (ref 98–111)
Creatinine, Ser: 0.68 mg/dL (ref 0.61–1.24)
GFR, Estimated: >60 mL/min (ref >60)
Glucose, Bld: 103 mg/dL — ABNORMAL HIGH (ref 70–99)
Potassium: 4.1 mmol/L (ref 3.5–5.1)
Sodium: 130 mmol/L — ABNORMAL LOW (ref 135–145)
Total Bilirubin: 3.4 mg/dL — ABNORMAL HIGH (ref 0.3–1.2)
Total Protein: 5.9 g/dL — ABNORMAL LOW (ref 6.5–8.1)

## 2021-11-03 LAB — IRON AND TIBC
Iron: 42 ug/dL — ABNORMAL LOW (ref 45–182)
Saturation Ratios: 25 % (ref 17.9–39.5)
TIBC: 165 ug/dL — ABNORMAL LOW (ref 250–450)
UIBC: 123 ug/dL

## 2021-11-03 LAB — FOLATE: Folate: 11.5 ng/mL (ref >5.9)

## 2021-11-03 LAB — FERRITIN: Ferritin: 234 ng/mL (ref 24–336)

## 2021-11-03 LAB — RETICULOCYTES
Immature Retic Fract: 15.7 % (ref 2.3–15.9)
RBC.: 2.5 MIL/uL — ABNORMAL LOW (ref 4.22–5.81)
Retic Count, Absolute: 38.8 10*3/uL (ref 19.0–186.0)
Retic Ct Pct: 1.6 % (ref 0.4–3.1)

## 2021-11-03 LAB — VITAMIN B12: Vitamin B-12: 548 pg/mL (ref 180–914)

## 2021-11-03 LAB — VITAMIN B1: Vitamin B1 (Thiamine): 68.9 nmol/L (ref 66.5–200.0)

## 2021-11-03 MED ORDER — PANTOPRAZOLE SODIUM 40 MG PO TBEC
40.0000 mg | DELAYED_RELEASE_TABLET | Freq: Every day | ORAL | Status: DC
Start: 2021-11-04 — End: 2021-11-05
  Administered 2021-11-04 – 2021-11-05 (×2): 40 mg via ORAL
  Filled 2021-11-03 (×2): qty 1

## 2021-11-03 MED ORDER — NITROFURANTOIN MONOHYD MACRO 100 MG PO CAPS
100.0000 mg | ORAL_CAPSULE | Freq: Two times a day (BID) | ORAL | Status: DC
  Administered 2021-11-03 – 2021-11-05 (×4): 100 mg via ORAL
  Filled 2021-11-03 (×5): qty 1

## 2021-11-03 MED ORDER — METOPROLOL TARTRATE 5 MG/5ML IV SOLN
5.0000 mg | Freq: Once | INTRAVENOUS | Status: AC
  Administered 2021-11-03: 5 mg via INTRAVENOUS
  Filled 2021-11-03: qty 5

## 2021-11-03 MED ORDER — SODIUM CHLORIDE 0.9 % IV SOLN: INTRAVENOUS | Status: DC

## 2021-11-03 MED ORDER — LACTATED RINGERS IV BOLUS
1000.0000 mL | Freq: Once | INTRAVENOUS | Status: AC
  Administered 2021-11-03: 1000 mL via INTRAVENOUS

## 2021-11-03 MED ORDER — SPIRONOLACTONE 25 MG PO TABS
100.0000 mg | ORAL_TABLET | Freq: Every day | ORAL | Status: DC
  Administered 2021-11-05: 100 mg via ORAL
  Filled 2021-11-03: qty 4

## 2021-11-03 MED ORDER — PANTOPRAZOLE SODIUM 40 MG PO TBEC
40.0000 mg | DELAYED_RELEASE_TABLET | Freq: Two times a day (BID) | ORAL | Status: DC
  Administered 2021-11-03: 40 mg via ORAL
  Filled 2021-11-03: qty 1

## 2021-11-03 MED ORDER — FUROSEMIDE 40 MG PO TABS
40.0000 mg | ORAL_TABLET | Freq: Every day | ORAL | Status: DC
  Administered 2021-11-05: 40 mg via ORAL
  Filled 2021-11-03: qty 1

## 2021-11-03 NOTE — Progress Notes (Signed)
The patient's heart rate suddenly went up to the 130s while his blood pressure was 103/75.  The patient denied pain and was not in distress. The nurse informed the physician and gave IV Ativan 1 mg.  The doctor ordered an EKG, which resulted in supraventricular tachycardia.  After 15 minutes after the Ativan was given, the patient's heart rate went up to 145 while the BP was 101/89.  The physician was informed again and ordered a one time IV Metoprolol 5 mg.  After the medicine was given, the patient's heart rated went down to 123.  Will continue to monitor.  Lupita Dawn, RN

## 2021-11-03 NOTE — Progress Notes (Signed)
Patient blood pressure 83/56, then 95/76. RN held PO lasix and spironolactone. Notified MD. VS otherwise stable. Patient resting. Edwena Blow, RN

## 2021-11-03 NOTE — Progress Notes (Signed)
About 30 minutes after the IV metoprolol was given, the pt's heart rate was around 125-127 and BP was 95/73.  The physician placed an order for an IV Lactated Ringer's 1000 mL bolus.  Will continue to monitor.   Lupita Dawn, RN

## 2021-11-03 NOTE — Progress Notes (Signed)
Daily Rounding Note  11/03/2021, 10:39 AM  LOS: 3 days   SUBJECTIVE:   Chief complaint: Alcoholic hepatitis.  Reports of bowel movements of unclear quantity or appearance yesterday.  None so far for day shift today.  Patient denies abdominal pain. Both parents in the room but father remained focused to his phone and mother continued phone conversation.  Did not ask any questions.  OBJECTIVE:         Vital signs in last 24 hours:    Temp:  [97.8 F (36.6 C)-98.9 F (37.2 C)] 98 F (36.7 C) (02/01 0510) Pulse Rate:  [66-144] 66 (02/01 0900) Resp:  [14-21] 14 (02/01 0641) BP: (90-103)/(63-89) 95/73 (02/01 0510) SpO2:  [97 %-100 %] 97 % (02/01 0641) Last BM Date: 11/02/21 Filed Weights   10/31/21 1317 10/31/21 2115  Weight: 45.4 kg 45.2 kg   General: Extremely cachectic, thin, looks older than stated age.  Resting comfortably in the bed. Heart: RRR Chest: No labored breathing or cough.  Sounds diminished due to poor inspiratory effort, no adventitious sounds. Abdomen: Scaphoid, thin, not tender.  Bowel sounds active. Extremities: Thin arms and legs, sarcopenia.  No CCE Neuro/Psych: Follows commands.  Limited verbalization.  No tremors.  Intake/Output from previous day: 01/31 0701 - 02/01 0700 In: 2558.4 [P.O.:1040; I.V.:518.3; IV Piggyback:1000.1] Out: 1400 [Urine:1400]  Intake/Output this shift: No intake/output data recorded.  Lab Results: Recent Labs    11/01/21 0514 11/02/21 0442 11/03/21 0203  WBC 5.0 5.4 7.1  HGB 10.4* 7.5* 8.2*  HCT 29.2* 21.2* 22.6*  PLT PLATELET CLUMPS NOTED ON SMEAR, UNABLE TO ESTIMATE 89* 111*   BMET Recent Labs    11/01/21 1249 11/02/21 0219 11/03/21 0203  NA 129* 131* 130*  K 3.6 3.4* 4.1  CL 91* 95* 99  CO2 '29 27 23  ' GLUCOSE 88 89 103*  BUN 5* <5* <5*  CREATININE 0.55* 0.61 0.68  CALCIUM 8.7* 9.3 9.2   LFT Recent Labs    10/31/21 2207 11/01/21 0514  11/02/21 0219 11/03/21 0203  PROT  --  6.4* 5.7* 5.9*  ALBUMIN  --  2.8* 3.2* 3.2*  AST  --  165* 138* 136*  ALT  --  95* 75* 83*  ALKPHOS  --  77 66 74  BILITOT 5.9* 5.7* 3.4* 3.4*  BILIDIR 3.0*  --  1.8*  --   IBILI 2.9*  --  1.6*  --    PT/INR Recent Labs    10/31/21 2207 11/01/21 0514  LABPROT 15.8* 15.7*  INR 1.3* 1.3*   Hepatitis Panel Recent Labs    10/31/21 2207  HEPBSAG NON REACTIVE  HCVAB NON REACTIVE  HEPAIGM NON REACTIVE  HEPBIGM NON REACTIVE    Studies/Results: DG CHEST PORT 1 VIEW  Result Date: 11/01/2021 CLINICAL DATA:  Altered mental status. EXAM: PORTABLE CHEST 1 VIEW COMPARISON:  Chest x-ray 04/03/2021. FINDINGS: Cardiomediastinal silhouette is within normal limits. Central interstitial prominence bilaterally. Minimal strandy opacities in the lung bases. Costophrenic angles are clear. No pneumothorax or acute fracture. IMPRESSION: 1. Central interstitial prominence may be related to mild edema or infection/inflammation. 2. Minimal bibasilar opacities favored as atelectasis. Electronically Signed   By: Ronney Asters M.D.   On: 11/01/2021 21:59    Scheduled Meds:  feeding supplement  237 mL Oral TID BM   folic acid  1 mg Oral Daily   lactulose  20 g Oral BID   multivitamin with minerals  1 tablet Oral Daily  pantoprazole  40 mg Oral BID   thiamine  100 mg Oral Daily   Or   thiamine  100 mg Intravenous Daily   Continuous Infusions: PRN Meds:.bisacodyl, LORazepam **OR** LORazepam, oxyCODONE   ASSESMENT:   EtOH hepatitis, CT w worsening hepatic steatosis but no indication cirrhosis.  T bili, transaminases improving.  Normal alk phos.    Nausea, non bloody emesis for ~ 1week PTA.  Weakness, FTT progressing for several weeks     anemia.  Hx IDA.  Home iron supplement.  EGD w esophagitis and mild gastric congestion., colonoscopy w nonbleeding erosions small TA polyp, healthy anastomosis in 12/2019.  Left colectomy for colon cancer 2007.  Protonix in  place.       Thrombocytopenia.  Platelets improving, 111 today.    Hyponatremia, overall improved.     Hepatic encephalopathy.  Ammonia 41.  Worsening AMS PTA.  Lactulose in place.  BM's yesterday.  Remains sluggish  Mild ascites.  On Lasix, spironolactone.  Home meds include Aldactone 100 mg daily, Lasix 40 mg daily which are not currently in place.     Alcoholism with alcoholic cardiomyopathy   PLAN     Resume lasix, aldactone.  Renal function, GFR not compromised.  Switch diet to HH/2 liter.   We will plan to see patient in a couple of days.  Continue current measures.       Richard [name]  11/03/2021, 10:39 AM Phone [phone]

## 2021-11-03 NOTE — Progress Notes (Signed)
Orthostatic VS Lying BP 93/61 pulse 69 Sitting BP 107/67 pulse 76 Standing BP 103/77 pulse 87
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
## 2021-11-04 ENCOUNTER — Telehealth: Payer: Self-pay

## 2021-11-04 LAB — GLUCOSE, CAPILLARY
Glucose-Capillary: 101 mg/dL — ABNORMAL HIGH (ref 70–99)
Glucose-Capillary: 106 mg/dL — ABNORMAL HIGH (ref 70–99)
Glucose-Capillary: 113 mg/dL — ABNORMAL HIGH (ref 70–99)
Glucose-Capillary: 122 mg/dL — ABNORMAL HIGH (ref 70–99)
Glucose-Capillary: 88 mg/dL (ref 70–99)
Glucose-Capillary: 91 mg/dL (ref 70–99)

## 2021-11-04 LAB — COMPREHENSIVE METABOLIC PANEL
ALT: 91 U/L — ABNORMAL HIGH (ref 0–44)
AST: 145 U/L — ABNORMAL HIGH (ref 15–41)
Albumin: 3.1 g/dL — ABNORMAL LOW (ref 3.5–5.0)
Alkaline Phosphatase: 74 U/L (ref 38–126)
Anion gap: 7 (ref 5–15)
BUN: <5 mg/dL — ABNORMAL LOW (ref 6–20)
CO2: 21 mmol/L — ABNORMAL LOW (ref 22–32)
Calcium: 8.9 mg/dL (ref 8.9–10.3)
Chloride: 104 mmol/L (ref 98–111)
Creatinine, Ser: 0.59 mg/dL — ABNORMAL LOW (ref 0.61–1.24)
GFR, Estimated: >60 mL/min (ref >60)
Glucose, Bld: 87 mg/dL (ref 70–99)
Potassium: 4.3 mmol/L (ref 3.5–5.1)
Sodium: 132 mmol/L — ABNORMAL LOW (ref 135–145)
Total Bilirubin: 3.3 mg/dL — ABNORMAL HIGH (ref 0.3–1.2)
Total Protein: 5.8 g/dL — ABNORMAL LOW (ref 6.5–8.1)

## 2021-11-04 LAB — URINE CULTURE: Culture: >=100000 — AB

## 2021-11-04 LAB — CBC
HCT: 23.9 % — ABNORMAL LOW (ref 39.0–52.0)
Hemoglobin: 8.3 g/dL — ABNORMAL LOW (ref 13.0–17.0)
MCH: 30.9 pg (ref 26.0–34.0)
MCHC: 34.7 g/dL (ref 30.0–36.0)
MCV: 88.8 fL (ref 80.0–100.0)
Platelets: 120 10*3/uL — ABNORMAL LOW (ref 150–400)
RBC: 2.69 MIL/uL — ABNORMAL LOW (ref 4.22–5.81)
RDW: 19.8 % — ABNORMAL HIGH (ref 11.5–15.5)
WBC: 7.5 10*3/uL (ref 4.0–10.5)
nRBC: 0 % (ref 0.0–0.2)

## 2021-11-04 LAB — AMMONIA: Ammonia: 68 umol/L — ABNORMAL HIGH (ref 9–35)

## 2021-11-04 MED ORDER — MIDODRINE HCL 5 MG PO TABS
5.0000 mg | ORAL_TABLET | Freq: Three times a day (TID) | ORAL | Status: DC
  Administered 2021-11-04 – 2021-11-05 (×4): 5 mg via ORAL
  Filled 2021-11-04 (×4): qty 1
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
## 2021-11-04 NOTE — Telephone Encounter (Signed)
Dr. Lorenso Courier asked if this patient could be scheduled for an office follow up with Dr. Henrene Pastor or APP in 2-3 weeks.  Can you help me with that please?
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
## 2021-11-04 NOTE — Telephone Encounter (Signed)
Sent to scheduler.  

## 2021-11-05 ENCOUNTER — Other Ambulatory Visit: Payer: Self-pay

## 2021-11-05 DIAGNOSIS — K7682 Hepatic encephalopathy: Secondary | ICD-10-CM

## 2021-11-05 DIAGNOSIS — K7011 Alcoholic hepatitis with ascites: Principal | ICD-10-CM

## 2021-11-05 LAB — COMPREHENSIVE METABOLIC PANEL
ALT: 87 U/L — ABNORMAL HIGH (ref 0–44)
AST: 124 U/L — ABNORMAL HIGH (ref 15–41)
Albumin: 2.8 g/dL — ABNORMAL LOW (ref 3.5–5.0)
Alkaline Phosphatase: 102 U/L (ref 38–126)
Anion gap: 5 (ref 5–15)
BUN: 5 mg/dL — ABNORMAL LOW (ref 6–20)
CO2: 20 mmol/L — ABNORMAL LOW (ref 22–32)
Calcium: 8.9 mg/dL (ref 8.9–10.3)
Chloride: 106 mmol/L (ref 98–111)
Creatinine, Ser: 0.61 mg/dL (ref 0.61–1.24)
GFR, Estimated: >60 mL/min (ref >60)
Glucose, Bld: 94 mg/dL (ref 70–99)
Potassium: 3.9 mmol/L (ref 3.5–5.1)
Sodium: 131 mmol/L — ABNORMAL LOW (ref 135–145)
Total Bilirubin: 2.5 mg/dL — ABNORMAL HIGH (ref 0.3–1.2)
Total Protein: 5.8 g/dL — ABNORMAL LOW (ref 6.5–8.1)

## 2021-11-05 LAB — GLUCOSE, CAPILLARY
Glucose-Capillary: 101 mg/dL — ABNORMAL HIGH (ref 70–99)
Glucose-Capillary: 105 mg/dL — ABNORMAL HIGH (ref 70–99)
Glucose-Capillary: 98 mg/dL (ref 70–99)

## 2021-11-05 LAB — AMMONIA: Ammonia: 55 umol/L — ABNORMAL HIGH (ref 9–35)

## 2021-11-05 MED ORDER — GABAPENTIN 300 MG PO CAPS: 300.0000 mg | ORAL_CAPSULE | Freq: Two times a day (BID) | ORAL | Status: DC

## 2021-11-05 MED ORDER — THIAMINE HCL 100 MG PO TABS
100.0000 mg | ORAL_TABLET | Freq: Every day | ORAL | 1 refills | Status: DC
  Filled 2021-11-05: qty 30, 30d supply, fill #0

## 2021-11-05 MED ORDER — FUROSEMIDE 40 MG PO TABS
ORAL_TABLET | Freq: Every day | ORAL | 0 refills | Status: DC
  Filled 2021-11-05: qty 30, 30d supply, fill #0

## 2021-11-05 MED ORDER — NITROFURANTOIN MONOHYD MACRO 100 MG PO CAPS
100.0000 mg | ORAL_CAPSULE | Freq: Two times a day (BID) | ORAL | 0 refills | Status: DC
  Filled 2021-11-05: qty 10, 5d supply, fill #0

## 2021-11-05 MED ORDER — LACTULOSE ENCEPHALOPATHY 10 GM/15ML PO SOLN
20.0000 g | Freq: Two times a day (BID) | ORAL | 0 refills | Status: DC
  Filled 2021-11-05: qty 1892, 32d supply, fill #0

## 2021-11-05 MED ORDER — MIDODRINE HCL 5 MG PO TABS
5.0000 mg | ORAL_TABLET | Freq: Three times a day (TID) | ORAL | 0 refills | Status: DC
  Filled 2021-11-05: qty 90, 30d supply, fill #0

## 2021-11-05 MED ORDER — PANTOPRAZOLE SODIUM 40 MG PO TBEC
40.0000 mg | DELAYED_RELEASE_TABLET | Freq: Every day | ORAL | 0 refills | Status: DC
  Filled 2021-11-05: qty 30, 30d supply, fill #0

## 2021-11-05 MED ORDER — SPIRONOLACTONE 100 MG PO TABS
100.0000 mg | ORAL_TABLET | Freq: Every day | ORAL | 2 refills | Status: DC
  Filled 2021-11-05: qty 30, 30d supply, fill #0
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
## 2021-11-05 NOTE — TOC Progression Note (Addendum)
Transition of Care [facility]) - Progression Note    Patient Details  Name: Richard [name] MRN: [id number] Date of Birth: 11/19/1965  [facility] CM/SW [place], [place] Phone Number: 11/05/2021, 1:53 PM  Clinical Narrative:     CSW met with patient. CSW introduced self and explained reason for the consult. Patient reports he drinks daily. He expressed the desire to stop drinking and advised he has stopped drinking before for 1 year. CSW provided empathetic ear and encourage the patient to utilize substance abuse and mental health resources provided. CSW explained the importance of developing other coping skills and encouraged patient to seek mental health counseling. CSW stressed he must be the one to initiate contact and start the change process. Patient states understanding.   Thurmond Butts, MSW, LCSW Clinical Social Worker    Expected Discharge Plan: [place] Barriers to Discharge: Barriers Resolved  Expected Discharge Plan and Services Expected Discharge Plan: [place] In-house Referral: Clinical Social Work Discharge Planning Services: CM Consult Post Acute Care Choice: Home Health, Durable Medical Equipment Living arrangements for the past 2 months: Apartment Expected Discharge Date: 11/05/21               DME Arranged: Berta Minor rolling DME Agency: [employer] Date DME Agency Contacted: 11/05/21 Time DME Agency Contacted: [id number] Representative spoke with at DME Agency: Freda Munro HH Arranged: PT, OT [place] Agency: [place] Date [place]: 11/05/21 Time [place]: 1335 Representative spoke with at [place]: [place] ([place]) Interventions    Readmission Risk Interventions Readmission Risk Prevention Plan 11/05/2021  Transportation Screening Complete  PCP or Specialist Appt within 5-7 Days Complete  Home Care Screening Complete  Medication  Review (RN CM) Complete  Some recent data might be hidden

## 2021-11-05 NOTE — Discharge Summary (Addendum)
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     CLINDAMYCIN <=0.25 SENSITIVE Sensitive     RIFAMPIN <=0.5 SENSITIVE Sensitive     Inducible Clindamycin NEGATIVE Sensitive     * >=100,000 COLONIES/mL STAPHYLOCOCCUS HAEMOLYTICUS     Labs: BNP (last 3 results) No results for input(s): BNP in the last 8760 hours. Basic Metabolic Panel: Recent Labs  Lab 10/31/21 1420 10/31/21 1426 11/01/21 1249 11/02/21 0219 11/03/21 0203 11/04/21 0322 11/05/21 0307  NA  --    < > 129* 131* 130* 132* 131*  K  --    < > 3.6 3.4* 4.1 4.3 3.9  CL  --    < > 91* 95* 99 104 106  CO2  --    < > 29 27 23  21* 20*  GLUCOSE  --    < > 88 89 103* 87 94  BUN  --    < > 5* <5* <5* <5* 5*  CREATININE  --    < > 0.55* 0.61 0.68 0.59* 0.61  CALCIUM   --    < > 8.7* 9.3 9.2 8.9 8.9  MG 1.9  --   --   --   --   --   --    < > = values in this interval not displayed.   Liver Function Tests: Recent Labs  Lab 11/01/21 0514 11/02/21 0219 11/03/21 0203 11/04/21 0322 11/05/21 0307  AST 165* 138* 136* 145* 124*  ALT 95* 75* 83* 91* 87*  ALKPHOS 77 66 74 74 102  BILITOT 5.7* 3.4* 3.4* 3.3* 2.5*  PROT 6.4* 5.7* 5.9* 5.8* 5.8*  ALBUMIN 2.8* 3.2* 3.2* 3.1* 2.8*   Recent Labs  Lab 10/31/21 1426  LIPASE 54*   Recent Labs  Lab 10/31/21 2207 11/04/21 0322 11/05/21 0307  AMMONIA 41* 68* 55*   CBC: Recent Labs  Lab 10/31/21 1426 11/01/21 0514 11/02/21 0442 11/03/21 0203 11/03/21 1439 11/04/21 0322  WBC 6.8 5.0 5.4 7.1 7.8 7.5  NEUTROABS 5.1  --   --   --   --   --   HGB 10.5* 10.4* 7.5* 8.2* 9.3* 8.3*  HCT 28.2* 29.2* 21.2* 22.6* 26.9* 23.9*  MCV 86.2 87.4 88.3 87.3 88.5 88.8  PLT 144* PLATELET CLUMPS NOTED ON SMEAR, UNABLE TO ESTIMATE 89* 111* 115* 120*   Cardiac Enzymes: No results for input(s): CKTOTAL, CKMB, CKMBINDEX, TROPONINI in the last 168 hours. BNP: Invalid input(s): POCBNP CBG: Recent Labs  Lab 11/04/21 1528 11/04/21 1947 11/05/21 0008 11/05/21 0752 11/05/21 1058  GLUCAP 113* 106* 105* 98 101*   D-Dimer No results for input(s): DDIMER in the last 72 hours. Hgb A1c No results for input(s): HGBA1C in the last 72 hours. Lipid Profile No results for input(s): CHOL, HDL, LDLCALC, TRIG, CHOLHDL, LDLDIRECT in the last 72 hours. Thyroid function studies No results for input(s): TSH, T4TOTAL, T3FREE, THYROIDAB in the last 72 hours.  Invalid input(s): FREET3 Anemia work up Recent Labs    11/03/21 0203  VITAMINB12 548  FOLATE 11.5  FERRITIN 234  TIBC 165*  IRON 42*  RETICCTPCT 1.6   Urinalysis  Component Value Date/Time   COLORURINE AMBER (A) 11/01/2021 2327   APPEARANCEUR CLEAR 11/01/2021 2327   LABSPEC 1.015 11/01/2021 2327   PHURINE 8.0 11/01/2021 2327   GLUCOSEU 100 (A) 11/01/2021 2327    HGBUR NEGATIVE 11/01/2021 2327   BILIRUBINUR SMALL (A) 11/01/2021 2327   KETONESUR 40 (A) 11/01/2021 2327   PROTEINUR NEGATIVE 11/01/2021 2327   UROBILINOGEN 0.2 01/13/2009 1011   NITRITE NEGATIVE 11/01/2021 2327   LEUKOCYTESUR NEGATIVE 11/01/2021 2327   Sepsis Labs Invalid input(s): PROCALCITONIN,  WBC,  LACTICIDVEN Microbiology Recent Results (from the past 240 hour(s))  Resp Panel by RT-PCR (Flu A&B, Covid) Nasopharyngeal Swab     Status: None   Collection Time: 10/31/21  5:58 PM   Specimen: Nasopharyngeal Swab; Nasopharyngeal(NP) swabs in vial transport medium  Result Value Ref Range Status   SARS Coronavirus 2 by RT PCR NEGATIVE NEGATIVE Final    Comment: (NOTE) SARS-CoV-2 target nucleic acids are NOT DETECTED.  The SARS-CoV-2 RNA is generally detectable in upper respiratory specimens during the acute phase of infection. The lowest concentration of SARS-CoV-2 viral copies this assay can detect is 138 copies/mL. A negative result does not preclude SARS-Cov-2 infection and should not be used as the sole basis for treatment or other patient management decisions. A negative result may occur with  improper specimen collection/handling, submission of specimen other than nasopharyngeal swab, presence of viral mutation(s) within the areas targeted by this assay, and inadequate number of viral copies(<138 copies/mL). A negative result must be combined with clinical observations, patient history, and epidemiological information. The expected result is Negative.  Fact Sheet for Patients:  EntrepreneurPulse.com.au  Fact Sheet for Healthcare Providers:  IncredibleEmployment.be  This test is no t yet approved or cleared by the [place] FDA and  has been authorized for detection and/or diagnosis of SARS-CoV-2 by FDA under an Emergency Use Authorization (EUA). This EUA will remain  in effect (meaning this test can be used) for the duration of  the COVID-19 declaration under Section 564(b)(1) of the Act, 21 U.S.C.section 360bbb-3(b)(1), unless the authorization is terminated  or revoked sooner.       Influenza A by PCR NEGATIVE NEGATIVE Final   Influenza B by PCR NEGATIVE NEGATIVE Final    Comment: (NOTE) The Xpert Xpress SARS-CoV-2/FLU/RSV plus assay is intended as an aid in the diagnosis of influenza from Nasopharyngeal swab specimens and should not be used as a sole basis for treatment. Nasal washings and aspirates are unacceptable for Xpert Xpress SARS-CoV-2/FLU/RSV testing.  Fact Sheet for Patients: EntrepreneurPulse.com.au  Fact Sheet for Healthcare Providers: IncredibleEmployment.be  This test is not yet approved or cleared by the [place] FDA and has been authorized for detection and/or diagnosis of SARS-CoV-2 by FDA under an Emergency Use Authorization (EUA). This EUA will remain in effect (meaning this test can be used) for the duration of the COVID-19 declaration under Section 564(b)(1) of the Act, 21 U.S.C. section 360bbb-3(b)(1), unless the authorization is terminated or revoked.  Performed at [facility], [place] [address] [place], [place] [address]   Urine Culture     Status: Abnormal   Collection Time: 11/01/21 11:25 PM   Specimen: Urine, Clean Catch  Result Value Ref Range Status   Specimen Description URINE, CLEAN CATCH  Final   Special Requests   Final    NONE Performed at [facility], [place] [address] [place], [place] [address]    Culture (A)  Final    >=100,000 COLONIES/mL STAPHYLOCOCCUS HAEMOLYTICUS  80,000 COLONIES/mL ENTEROCOCCUS FAECALIS    Report Status 11/04/2021 FINAL  Final   Organism ID, Bacteria STAPHYLOCOCCUS HAEMOLYTICUS (A)  Final   Organism ID, Bacteria ENTEROCOCCUS FAECALIS (A)  Final      Susceptibility   Enterococcus faecalis - MIC*    AMPICILLIN <=2 SENSITIVE Sensitive     NITROFURANTOIN <=16 SENSITIVE Sensitive      VANCOMYCIN 1 SENSITIVE Sensitive     * 80,000 COLONIES/mL ENTEROCOCCUS FAECALIS   Staphylococcus haemolyticus - MIC*    CIPROFLOXACIN <=0.5 SENSITIVE Sensitive     GENTAMICIN <=0.5 SENSITIVE Sensitive     NITROFURANTOIN 32 SENSITIVE Sensitive     OXACILLIN <=0.25 SENSITIVE Sensitive     TETRACYCLINE <=1 SENSITIVE Sensitive     VANCOMYCIN <=0.5 SENSITIVE Sensitive     TRIMETH/SULFA <=10 SENSITIVE Sensitive     CLINDAMYCIN <=0.25 SENSITIVE Sensitive     RIFAMPIN <=0.5 SENSITIVE Sensitive     Inducible Clindamycin NEGATIVE Sensitive     * >=100,000 COLONIES/mL STAPHYLOCOCCUS HAEMOLYTICUS     Time coordinating discharge: 53mins  SIGNED:   Kathie Dike, MD  [facility] 11/05/2021, 11:46 PM   If 7PM-7AM, please contact night-coverage [email or web address]

## 2021-11-05 NOTE — Progress Notes (Signed)
D/C instructions given to pt. medications reviewed, All questions answered. IV removed, clean and intact.  Clyde Canterbury, RN

## 2021-11-05 NOTE — TOC Transition Note (Addendum)
Transition of Care (TOC) - CM/SW Discharge Note Marvetta Gibbons RN, BSN Transitions of Care Unit 4E- RN Case Manager See Treatment Team for direct phone #    Patient Details  Name: Richard [name] MRN: [id number] Date of Birth: 04-26-1966  Transition of Care [facility]) CM/SW Contact:  Dawayne Patricia, RN Phone Number: 11/05/2021, 1:35 PM   Clinical Narrative:    Pt has been cleared by MD for transition home today. Orders for HHPT/OT and DME needs placed. CM in to speak with pt at bedside for transition needs. Per conversation with pt he reports that he lives at home with brother, also has nieces (57 and 37 yr old) that live there and can assist - states "someone is always there". Therapy also present and discussed safety of having someone there with him at all times initially until his mental status/memory  improves. Pt states he can also ask his mom to stay with them.   Pt agreeable to [facility] services- list provided for [facility] choicePer CMS guidelines from medicare.gov website with star ratings (copy placed in shadow chart) -per pt he does not have a preference and will defer to this writer to secure an agency on his behalf.  Address, phone #s, PCP all confirmed with pt in epic.  Also reviewed with pt new address for [facility] clinic for f/u. Pt voiced understanding.  Discussed DME needs- pt states he would like both RW and 3n1 for home as per recommendations.    Calls made to following [facility] agencies:  [facility] (spoke with Corene Cornea) - they do not currently accept insurance [facility] (spoke w/ Hoyle Sauer)- unable to accept referral at this time [facility]- (spoke w/ Malachy Mood)- they do not currently accept insurance at this time [facility]- (spoke with Tommi Rumps)- willing to accept referral for needed services- referral has been accepted for PT/OT  Call made to [facility] for DME need- RW and 3n1- they will run coverage through insurance- and deliver to room prior to insurance.  60- received msg from [facility] regarding DME- pt  received both RW and 3n1 in July 2022 under his Medicaid benefits- insurance will not cover again at this time [facility] covers DME once every 16yrs). Will inform pt to see if he wants to pay out of pocket.  1400- spoke with pt about DME- per pt he has rollator at home, and family at bedside state that they have a RW that he can borrow if needed, pt states he will be ok without the 3n1. Voiced understanding about DME coverage.   CSW to come see pt for SA/ETOH resources.    Final next level of care: [place] Barriers to Discharge: Barriers Resolved   Patient Goals and CMS Choice Patient states their goals for this hospitalization and ongoing recovery are:: return home with family CMS Medicare.gov Compare Post Acute Care list provided to:: Patient Choice offered to / list presented to : Patient  Discharge Placement                 Home w/ [facility]      Discharge Plan and Services In-house Referral: Clinical Social Work Discharge Planning Services: CM Consult Post Acute Care Choice: Home Health, Durable Medical Equipment          DME Arranged: 3-N-1, Walker rolling DME Agency: [facility] Date DME Agency Contacted: 11/05/21 Time DME Agency Contacted: [phone] Representative spoke with at DME Agency: Freda Munro HH Arranged: PT, OT [facility] Agency: [place] Date [place]: 11/05/21 Time  [place] Agency Contacted: [phone] Representative spoke with at [place]: [place] ([place]) Interventions     Readmission Risk Interventions Readmission Risk Prevention Plan 11/05/2021  Transportation Screening Complete  PCP or Specialist Appt within 5-7 Days Complete  Home Care Screening Complete  Medication Review (RN CM) Complete  Some recent data might be hidden
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
## 2021-11-05 NOTE — Plan of Care (Signed)
  Problem: Health Behavior/Discharge Planning: Goal: Ability to manage health-related needs will improve Outcome: Not Progressing   

## 2021-11-08 ENCOUNTER — Telehealth: Payer: Self-pay

## 2021-11-08 NOTE — Telephone Encounter (Signed)
Left voicemail for patient to return call to schedule appointment.

## 2021-11-08 NOTE — Telephone Encounter (Signed)
Transition Care Management Unsuccessful Follow-up Telephone Call  Date of discharge and from where:  11/05/2021, [facility]   Attempts:  1st Attempt  Reason for unsuccessful TCM follow-up call:  Left voice messages on # [phone], [phone] and [phone].  Call back requested to this CM.  Patient has appointment with Dr Margarita Rana 11/09/2021.

## 2021-11-09 ENCOUNTER — Telehealth: Payer: Self-pay

## 2021-11-09 ENCOUNTER — Encounter: Payer: Self-pay | Admitting: Family Medicine

## 2021-11-09 ENCOUNTER — Ambulatory Visit: Payer: Medicaid Other | Attending: Family Medicine | Admitting: Family Medicine

## 2021-11-09 VITALS — BP 112/71 | HR 71 | Ht 72.0 in | Wt 115.2 lb

## 2021-11-09 DIAGNOSIS — F32A Depression, unspecified: Secondary | ICD-10-CM | POA: Diagnosis not present

## 2021-11-09 DIAGNOSIS — M546 Pain in thoracic spine: Secondary | ICD-10-CM

## 2021-11-09 DIAGNOSIS — G621 Alcoholic polyneuropathy: Secondary | ICD-10-CM

## 2021-11-09 DIAGNOSIS — M4802 Spinal stenosis, cervical region: Secondary | ICD-10-CM

## 2021-11-09 DIAGNOSIS — M545 Low back pain, unspecified: Secondary | ICD-10-CM | POA: Diagnosis not present

## 2021-11-09 DIAGNOSIS — K7011 Alcoholic hepatitis with ascites: Secondary | ICD-10-CM | POA: Diagnosis not present

## 2021-11-09 MED ORDER — FUROSEMIDE 20 MG PO TABS: 20.0000 mg | ORAL_TABLET | Freq: Every day | ORAL | 3 refills | Status: DC

## 2021-11-09 MED ORDER — GABAPENTIN 300 MG PO CAPS: 600.0000 mg | ORAL_CAPSULE | Freq: Two times a day (BID) | ORAL | 3 refills | Status: DC

## 2021-11-09 MED ORDER — PANTOPRAZOLE SODIUM 40 MG PO TBEC: 40.0000 mg | DELAYED_RELEASE_TABLET | Freq: Every day | ORAL | 3 refills | Status: DC

## 2021-11-09 MED ORDER — DULOXETINE HCL 60 MG PO CPEP: 60.0000 mg | ORAL_CAPSULE | Freq: Every day | ORAL | 3 refills | Status: DC

## 2021-11-09 NOTE — Telephone Encounter (Signed)
Transition Care Management Follow-up Telephone Call Date of discharge and from where: 11/05/2021, [facility]  How have you been since you were released from the hospital? He said he is doing pretty good. He was difficult to understand at times as there was a lot of background noise.  Any questions or concerns? No  Items Reviewed: Did the pt receive and understand the discharge instructions provided? Yes  Medications obtained and verified? Yes  - he said that he has all of his medications and did not have any questions about the med regime.  Other? No  Any new allergies since your discharge? No  Dietary orders reviewed? Yes but he said it is very hard to avoid salt. He likes to add a little salt to his food.  Do you have support at home?  He said he is living with his brother but would like to have his own place.   Home Care and Equipment/Supplies: Were home health services ordered? yes If so, what is the name of the agency? [facility] - PT/OT  Has the agency set up a time to come to the patient's home? no Were any new equipment or medical supplies ordered?  No What is the name of the medical supply agency? N/a Were you able to get the supplies/equipment? not applicable Do you have any questions related to the use of the equipment or supplies? No  Functional Questionnaire: (I = Independent and D = Dependent) ADLs: He said he has a walker and cane to use with ambulation but he prefers the cane.   His family provides assist with ADLs as needed.   Follow up appointments reviewed:  PCP Hospital f/u appt confirmed? Yes  Scheduled to see Dr Margarita Rana today - 11/09/2021.  - informed him of the new clinic address [facility] f/u appt confirmed?  None scheduled at this time.   Are transportation arrangements needed? No  If their condition worsens, is the pt aware to call PCP or go to the Emergency Dept.? Yes Was the patient provided with contact information for the PCP's office or ED?  Yes Was to pt encouraged to call back with questions or concerns? Yes

## 2021-11-09 NOTE — Progress Notes (Signed)
Having all over pain.

## 2021-11-09 NOTE — Telephone Encounter (Signed)
From the discharge call:  He said he is doing pretty good. He was difficult to understand at times as there was a lot of background noise.   he said that he has all of his medications and did not have any questions about the med regime.   He is living with his brother but would like to have his own place.   He has a walker and cane to use with ambulation but he prefers the cane. His family provides assist with ADLs as needed.  Scheduled to see Dr Margarita Rana today - 11/09/2021.  - informed him of the new clinic address

## 2021-11-09 NOTE — Telephone Encounter (Signed)
Met with the patient when he was in the clinic today. He explained that he is not sure why he does not qualify for food stamps. He said that he has tried to apply in the past. He was in agreement to placing a referral to [employer] [facility] [place] for possible assistance.  Referral then sent to Novella Olive, Attorney/LANC  He currently has no income and said that he is going for a mental health evaluation for disability 11/29/2021 in [place]. He is not sure who scheduled this appointment for him but he has the address where he is supposed to go.  He was not sure if Asante McCoy, LCSW assisted with this referral.  He is interested in living in his own place when he has an income. He stays with his brother but said that his brother would like him to move out. The patient said that he has tried to contact [employer] with no success yet. This CM provided him with the phone number for [facility] and instructed him to call this CM back if he has questions/ needs further assistance.
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
## 2021-11-11 ENCOUNTER — Telehealth: Payer: Self-pay

## 2021-11-11 NOTE — Telephone Encounter (Signed)
Copied from [place] [id number]. Topic: General - Other >> Nov 11, 2021  3:27 PM Valere Dross wrote: Reason for CRM: Pt called in about his medications, not really sure what he is asking for, but he is needing to speak with a nurse. Please advise.

## 2021-11-12 ENCOUNTER — Other Ambulatory Visit: Payer: Self-pay

## 2021-11-16 ENCOUNTER — Other Ambulatory Visit: Payer: Self-pay | Admitting: Family Medicine

## 2021-11-16 DIAGNOSIS — K7030 Alcoholic cirrhosis of liver without ascites: Secondary | ICD-10-CM

## 2021-11-22 NOTE — Telephone Encounter (Signed)
Call received from patient today. He explained that he has been placed on a wait list for housing but he is not sure where. He confirmed his appointment with the mental health provider in [place] on 11/29/2021.   He said he was interested in assistance obtaining food and was in agreement to placing a referral to [facility]. He is not sure that he will be approved for food stamps.  Reminded him that a referral was sent to [employer] [facility] for assistance with applying for food stamps and addressing his questions about being denied food stamps in the past.   He is aware of the appointment with GI - 12/09/2021 and said he will schedule his transportation for this appointment as well as the appointment on 11/29/2021 with mental health

## 2021-11-29 ENCOUNTER — Other Ambulatory Visit: Payer: Self-pay | Admitting: Family Medicine

## 2021-11-29 DIAGNOSIS — M4802 Spinal stenosis, cervical region: Secondary | ICD-10-CM

## 2021-11-29 DIAGNOSIS — S22089D Unspecified fracture of T11-T12 vertebra, subsequent encounter for fracture with routine healing: Secondary | ICD-10-CM

## 2021-12-03 NOTE — Telephone Encounter (Signed)
Attempted to contact patient # [phone] to confirm that he scheduled transportation to his upcoming appointment with GI  - 12/09/2021.  ?Message left with call back requested to this CM.  ?

## 2021-12-07 NOTE — Telephone Encounter (Signed)
Attempted again to contact patient # [phone] to confirm that he scheduled transportation to his upcoming appointment with GI  - 12/09/2021.  ?Message left with call back requested to this CM or Eyvonne Mechanic ?

## 2021-12-09 ENCOUNTER — Ambulatory Visit: Payer: Medicaid Other | Admitting: Gastroenterology

## 2021-12-13 ENCOUNTER — Telehealth: Payer: Self-pay | Admitting: Family Medicine

## 2021-12-13 NOTE — Telephone Encounter (Signed)
Call returned to patient.  He explained that he has not heard from [facility] about his question regarding food stamps. He also stated that he has not heard from [facility] about the referral for nutrition assistance. Informed him that I would follow up regarding both referrals.  ?He also said that GI cancelled his appointment 3/9/023 and rescheduled him for 12/17/2021. He said he will call to arrange his ride to the appointment.  ?He said that he needs to apply for disability and will need to see a therapist. He was not exactly sure what is needed. Instructed him to collect any documents that he has regarding this application and then call this CM back back for guidance about what he needs to do next.   ?

## 2021-12-13 NOTE — Telephone Encounter (Signed)
Patient requesting to speak with Opal Sidles regarding therapy and food assistance and would like a follow up call.  ?

## 2021-12-17 ENCOUNTER — Other Ambulatory Visit (INDEPENDENT_AMBULATORY_CARE_PROVIDER_SITE_OTHER): Payer: Medicaid Other

## 2021-12-17 ENCOUNTER — Ambulatory Visit: Payer: Medicaid Other | Admitting: Gastroenterology

## 2021-12-17 ENCOUNTER — Other Ambulatory Visit: Payer: Self-pay

## 2021-12-17 ENCOUNTER — Encounter: Payer: Self-pay | Admitting: Gastroenterology

## 2021-12-17 VITALS — BP 110/74 | HR 76 | Ht 70.0 in | Wt 117.6 lb

## 2021-12-17 DIAGNOSIS — K7030 Alcoholic cirrhosis of liver without ascites: Secondary | ICD-10-CM | POA: Diagnosis not present

## 2021-12-17 DIAGNOSIS — R1084 Generalized abdominal pain: Secondary | ICD-10-CM

## 2021-12-17 LAB — CBC WITH DIFFERENTIAL/PLATELET
Basophils Absolute: 0.1 10*3/uL (ref 0.0–0.1)
Basophils Relative: 2.4 % (ref 0.0–3.0)
Eosinophils Absolute: 0.2 10*3/uL (ref 0.0–0.7)
Eosinophils Relative: 3.1 % (ref 0.0–5.0)
HCT: 32.5 % — ABNORMAL LOW (ref 39.0–52.0)
Hemoglobin: 10.9 g/dL — ABNORMAL LOW (ref 13.0–17.0)
Lymphocytes Relative: 29.1 % (ref 12.0–46.0)
Lymphs Abs: 1.6 10*3/uL (ref 0.7–4.0)
MCHC: 33.4 g/dL (ref 30.0–36.0)
MCV: 89.4 fl (ref 78.0–100.0)
Monocytes Absolute: 0.6 10*3/uL (ref 0.1–1.0)
Monocytes Relative: 11.7 % (ref 3.0–12.0)
Neutro Abs: 3 10*3/uL (ref 1.4–7.7)
Neutrophils Relative %: 53.7 % (ref 43.0–77.0)
Platelets: 252 10*3/uL (ref 150.0–400.0)
RBC: 3.64 Mil/uL — ABNORMAL LOW (ref 4.22–5.81)
RDW: 16.4 % — ABNORMAL HIGH (ref 11.5–15.5)
WBC: 5.5 10*3/uL (ref 4.0–10.5)

## 2021-12-17 LAB — COMPREHENSIVE METABOLIC PANEL
ALT: 11 U/L (ref 0–53)
AST: 25 U/L (ref 0–37)
Albumin: 4.1 g/dL (ref 3.5–5.2)
Alkaline Phosphatase: 133 U/L — ABNORMAL HIGH (ref 39–117)
BUN: 7 mg/dL (ref 6–23)
CO2: 26 mEq/L (ref 19–32)
Calcium: 10.3 mg/dL (ref 8.4–10.5)
Chloride: 101 mEq/L (ref 96–112)
Creatinine, Ser: 0.61 mg/dL (ref 0.40–1.50)
GFR: 109.25 mL/min (ref >60.00)
Glucose, Bld: 86 mg/dL (ref 70–99)
Potassium: 3.6 mEq/L (ref 3.5–5.1)
Sodium: 135 mEq/L (ref 135–145)
Total Bilirubin: 1.1 mg/dL (ref 0.2–1.2)
Total Protein: 8.7 g/dL — ABNORMAL HIGH (ref 6.0–8.3)

## 2021-12-17 LAB — PROTIME-INR
INR: 1.4 ratio — ABNORMAL HIGH (ref 0.8–1.0)
Prothrombin Time: 15.5 s — ABNORMAL HIGH (ref 9.6–13.1)

## 2021-12-17 MED ORDER — HYOSCYAMINE SULFATE SL 0.125 MG SL SUBL
0.1250 mg | SUBLINGUAL_TABLET | Freq: Two times a day (BID) | SUBLINGUAL | 2 refills | Status: DC
  Filled 2021-12-17: qty 60, 30d supply, fill #0

## 2021-12-17 NOTE — Progress Notes (Signed)
? ? ? ?12/17/2021 ?Richard [name] ?[id number] ?20-Jul-1966 ? ? ?HISTORY OF PRESENT ILLNESS:  This is a 56 year old male with history of alcohol abuse, colon cancer s/p L hemicolectomy in 2007, prior prior alcoholic pancreatitis, prior alcoholic hepatitis.  He was last seen in our office in November 2021 at which time he had not been drinking.  At some point he started drinking again, however, and was seen by our service in late January 2023 when we saw him in the hospital when he presented with altered mental status, abdominal pain, back pain.  Ammonia level was elevated.  Started on lactulose 30 mL twice daily. ? ?He is here for hospital follow-up.  They offered an EGD while he was in the hospital, but he declined so they said we could plan for as an outpatient.  He is a very poor/limited historian and does not seem to know exactly what all medications he is taking.  His list shows Lasix 20 mg daily, spironolactone 100 mg daily, pantoprazole 40 mg daily in addition to the lactulose from a GI standpoint.  Looks like he had hyoscyamine at one point for abdominal pain.  He complains of some generalized mid abdominal pain.  Once again is a poor historian. ? ?CT scan of the abdomen and pelvis with contrast on 10/31/2021 showed the following: ? ?IMPRESSION: ?Significant worsening of geographic pattern of steatosis throughout ?the liver, consistent with history of acute hepatitis. No evidence ?of hepatic neoplasm. ?  ?Increased mild diffuse mesenteric edema and minimal ascites. ?  ?Cholelithiasis. No radiographic evidence of cholecystitis. ?  ?Aortic Atherosclerosis (ICD10-I70.0). ? ? ?He tells me has not had any alcohol his since his hospital stay in 10/2021. ? ? ?Past Medical History:  ?Diagnosis Date  ? Abnormal nuclear cardiac imaging test   ? Acid reflux   ? Acute pancreatitis 08/14/2018  ? Alcohol withdrawal delirium ([place]) 08/20/2016  ? 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 Alcoholic hepatitis   ? Alcoholic  ketoacidosis 6/57/8469  ? Ascites 12/13/2019  ? Aspiration pneumonia ([place]) 08/20/2016  ? Chest pain of uncertain etiology   ? Chronic systolic CHF (congestive heart failure) ([place])   ? Cirrhosis ([place])   ? Colon cancer ([place])   ? DCM (dilated cardiomyopathy) ([place])   ? Drug abuse ([place]) 01/07/2020  ? Elevated troponin 06/27/2018  ? ETOH abuse   ? Gastropathy 08/14/2018  ? Heme positive stool 11/13/2017  ? Hepatic steatosis 08/21/2016  ? High anion gap metabolic acidosis 03/05/9527  ? History of colon cancer   ? HTN (hypertension)   ? Hypertensive urgency 06/27/2018  ? Hypoglycemia 06/27/2018  ? Hypokalemia 01/07/2020  ? Hypomagnesemia   ? Hypophosphatemia   ? Lactic acidosis 08/20/2016  ? Leukocytosis 01/07/2020  ? Pancreatitis 08/2018  ? Polyp of ascending colon   ? Prolonged Q-T interval on ECG   ? Prolonged QT interval   ? Protein-calorie malnutrition, severe 05/02/2018  ? PUD (peptic ulcer disease)   ? SBP (spontaneous bacterial peritonitis) ([place]) 01/07/2020  ? Sepsis ([place]) 08/20/2016  ? 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 SVT (supraventricular tachycardia) ([place])   ? Thrombocytopenia ([place]) 08/21/2016  ? ?Past Surgical History:  ?Procedure Laterality Date  ? BIOPSY  12/14/2019  ? Procedure: BIOPSY;  Surgeon: Mauri Pole, MD;  [facility]: [facility];  Service: Endoscopy;;  ? catherization  2007  ? COLONOSCOPY WITH PROPOFOL N/A 12/14/2019  ? Procedure: COLONOSCOPY WITH PROPOFOL;  Surgeon: Mauri Pole, MD;  [facility];  Service: Endoscopy;  Laterality: N/A;  ? ESOPHAGOGASTRODUODENOSCOPY (EGD) WITH PROPOFOL N/A 12/14/2019  ? Procedure: ESOPHAGOGASTRODUODENOSCOPY (EGD) WITH PROPOFOL;  Surgeon: Mauri Pole, MD;  [facility];  Service: Endoscopy;  Laterality: N/A;  ? HERNIA REPAIR  1969  ? 1 x at birth and at 56 years old  ? LAPAROSCOPIC SIGMOID COLECTOMY  2007  ? OPEN REDUCTION INTERNAL FIXATION (ORIF) HAND Right 2012  ? 3rd  digit  ? POLYPECTOMY  12/14/2019  ? Procedure: POLYPECTOMY;  Surgeon:  Mauri Pole, MD;  [facility];  Service: Endoscopy;;  ? ? reports that he has been smoking cigarettes. He has been smoking an average of 1 pack per day. He has never used smokeless tobacco. He reports that he does not currently use alcohol after a past usage of about 2.0 standard drinks per week. He reports current drug use. Drug: Marijuana. ?family history includes Cancer in his sister; Colon cancer in his cousin and father; Hypertension in his mother; Kidney disease in his sister. ?Allergies  ?Allergen Reactions  ? Aspirin Other (See Comments)  ?  Acid reflux   ? Aspirin Other (See Comments)  ?  Caused acid reflux  ? Penicillins Hives  ?  Has patient had a PCN reaction causing immediate rash, facial/tongue/throat swelling, SOB or lightheadedness with hypotension: yes ?Has patient had a PCN reaction causing severe rash involving mucus membranes or skin necrosis: no ?Has patient had a PCN reaction that required hospitalization: no ?Has patient had a PCN reaction occurring within the last 10 years: no ?If all of the above answers are "NO", then may proceed with Cephalosporin use. ?  ? Penicillins Hives  ? ? ?  ?Outpatient Encounter Medications as of 12/17/2021  ?Medication Sig  ? DULoxetine (CYMBALTA) 60 MG capsule Take 1 capsule (60 mg total) by mouth daily.  ? furosemide (LASIX) 20 MG tablet Take 1 tablet (20 mg total) by mouth daily.  ? gabapentin (NEURONTIN) 300 MG capsule Take 2 capsules (600 mg total) by mouth 2 (two) times daily.  ? lactulose, encephalopathy, (CHRONULAC) 10 GM/15ML SOLN Take 30 mLs (20 g total) by mouth 2 (two) times daily. TAKE 30 ML BY MOUTH TWICE DAILY ?Strength: 10 GM/15ML  ? pantoprazole (PROTONIX) 40 MG tablet Take 1 tablet (40 mg total) by mouth daily.  ? aspirin 81 MG EC tablet Take 1 tablet (81 mg total) by mouth daily. (Patient not taking: Reported on 12/17/2021)  ? Cholecalciferol (VITAMIN D-3) 125 MCG (5000 UT) TABS Take 2 PO qd x 3 months, then 1 PO qd long-term  after that (Patient not taking: Reported on 12/17/2021)  ? diclofenac Sodium (VOLTAREN) 1 % GEL APPLY 2 G TOPICALLY 4 (FOUR) TIMES DAILY. (Patient not taking: Reported on 12/17/2021)  ? ferrous gluconate (FERGON) 324 MG tablet Take 1 tablet (324 mg total) by mouth daily with breakfast. (Patient not taking: Reported on 12/17/2021)  ? Hyoscyamine Sulfate SL 0.125 MG SUBL PLACE 1 TABLET (0.125 MG TOTAL) UNDER THE TONGUE EVERY 8 (EIGHT) HOURS AS NEEDED. (Patient taking differently: Place 1 tablet under the tongue daily as needed (IBS).)  ? lidocaine (LIDODERM) 5 % Place 1 patch onto the skin daily. Remove & discard patch within 12 hours or as directed by MD (Patient not taking: Reported on 12/17/2021)  ? magnesium oxide (MAG-OX) 400 (241.3 Mg) MG tablet Take 1 tablet (400 mg total) by mouth daily. (Patient not taking: Reported on 12/17/2021)  ? midodrine (PROAMATINE) 5 MG tablet Take 1 tablet (5 mg total) by mouth  3 (three) times daily with meals. (Patient not taking: Reported on 12/17/2021)  ? spironolactone (ALDACTONE) 100 MG tablet Take 1 tablet (100 mg total) by mouth daily. (Patient not taking: Reported on 12/17/2021)  ? thiamine 100 MG tablet Take 1 tablet (100 mg total) by mouth daily. (Patient not taking: Reported on 12/17/2021)  ? [DISCONTINUED] nitrofurantoin, macrocrystal-monohydrate, (MACROBID) 100 MG capsule Take 1 capsule (100 mg total) by mouth every 12 (twelve) hours. (Patient not taking: Reported on 12/17/2021)  ? ?No facility-administered encounter medications on file as of 12/17/2021.  ? ? ? ?REVIEW OF SYSTEMS  : All other systems reviewed and negative except where noted in the History of Present Illness. ? ? ?PHYSICAL EXAM: ?BP 110/74   Pulse 76   Ht '5\' 10"'$  (1.778 m)   Wt 117 lb 9.6 oz (53.3 kg)   SpO2 98%   BMI 16.87 kg/m?  ?General: Thin AA male in no acute distress ?Head: Normocephalic and atraumatic ?Eyes:  Sclerae anicteric, conjunctiva pink. ?Ears: Normal auditory acuity  ?Lungs: Clear throughout to  auscultation; no W/R/R. ?Heart: Regular rate and rhythm; no M/R/G. ?Abdomen: Soft, non-distended.  BS present.  Minimal diffuse TTP. ?Musculoskeletal: Symmetrical with no gross deformities  ?Skin: No lesions on

## 2021-12-17 NOTE — Progress Notes (Signed)
Noted  

## 2021-12-17 NOTE — Patient Instructions (Addendum)
If you are age 54 or older, your body mass index should be between 23-30. Your Body mass index is 16.87 kg/m?Marland Kitchen If this is out of the aforementioned range listed, please consider follow up with your Primary Care Provider. ? ?If you are age 33 or younger, your body mass index should be between 19-25. Your Body mass index is 16.87 kg/m?Marland Kitchen If this is out of the aformentioned range listed, please consider follow up with your Primary Care Provider.  ? ?________________________________________________________ ? ?The [facility] providers would like to encourage you to use [facility] to communicate with providers for non-urgent requests or questions.  Due to long hold times on the telephone, sending your provider a message by [facility] may be a faster and more efficient way to get a response.  Please allow 48 business hours for a response.  Please remember that this is for non-urgent requests.  ?_______________________________________________________ ? ?You have been scheduled for an endoscopy. Please follow written instructions given to you at your visit today. ?If you use inhalers (even only as needed), please bring them with you on the day of your procedure. ? ?Your provider has requested that you go to the basement level for lab work before leaving today. Press "B" on the elevator. The lab is located at the first door on the left as you exit the elevator. ? ?Due to recent changes in healthcare laws, you may see the results of your imaging and laboratory studies on MyChart before your provider has had a chance to review them.  We understand that in some cases there may be results that are confusing or concerning to you. Not all laboratory results come back in the same time frame and the provider may be waiting for multiple results in order to interpret others.  Please give [place] 48 hours in order for your provider to thoroughly review all the results before contacting the office for clarification of your results.  ? ?It was a  pleasure to see you today! ? ?Thank you for trusting me with your gastrointestinal care!   ? ? ?

## 2021-12-20 LAB — AFP TUMOR MARKER: AFP-Tumor Marker: 4.7 ng/mL (ref <6.1)

## 2021-12-24 ENCOUNTER — Other Ambulatory Visit: Payer: Self-pay

## 2021-12-28 ENCOUNTER — Other Ambulatory Visit: Payer: Self-pay | Admitting: Family Medicine

## 2021-12-28 DIAGNOSIS — K7682 Hepatic encephalopathy: Secondary | ICD-10-CM

## 2022-01-05 ENCOUNTER — Telehealth: Payer: Self-pay | Admitting: Family Medicine

## 2022-01-05 DIAGNOSIS — M545 Low back pain, unspecified: Secondary | ICD-10-CM

## 2022-01-05 NOTE — Telephone Encounter (Signed)
Done

## 2022-01-05 NOTE — Telephone Encounter (Signed)
Copied from [place] [id number]. Topic: General - Other >> Jan 05, 2022 10:24 AM Erick Blinks wrote: Reason for CRM: Pt called to state that he is still waiting on follow up from discussion about Physical Therapy with PCP.

## 2022-01-05 NOTE — Telephone Encounter (Signed)
Pt is requesting PT

## 2022-01-06 ENCOUNTER — Telehealth: Payer: Self-pay | Admitting: Family Medicine

## 2022-01-06 NOTE — Telephone Encounter (Signed)
Medication Refill - Medication: acetaminophen-codeine (TYLENOL #3) 300-30 MG tablet  ? ?Has the patient contacted their pharmacy? Yes.   ?(Agent: If no, request that the patient contact the pharmacy for the refill. If patient does not wish to contact the pharmacy document the reason why and proceed with request.) ?(Agent: If yes, when and what did the pharmacy advise?) ? ?Preferred Pharmacy (with phone number or street name): [facility] [address] ?Has the patient been seen for an appointment in the last year OR does the patient have an upcoming appointment? Yes.   ? ?Agent: Please be advised that RX refills may take up to 3 business days. We ask that you follow-up with your pharmacy. ? ?

## 2022-01-07 NOTE — Telephone Encounter (Signed)
I had referred him to [facility] on 10/2021. Please provide him with referral info from his chart so he can call for an appointment. Thanks. ?

## 2022-01-10 NOTE — Telephone Encounter (Signed)
Patient states he will call back.  ? ?Was looking for a way to take down the number to [facility] ?[phone] ? ?Physical Therapy- [phone]  ? ?

## 2022-01-10 NOTE — Telephone Encounter (Signed)
Called patient back and gave number to facilities.  ?Also included phone number to Orthopedic Surgery ?

## 2022-01-18 ENCOUNTER — Ambulatory Visit: Payer: Medicaid Other | Attending: Family Medicine

## 2022-01-18 DIAGNOSIS — M5459 Other low back pain: Secondary | ICD-10-CM | POA: Insufficient documentation

## 2022-01-18 DIAGNOSIS — M6281 Muscle weakness (generalized): Secondary | ICD-10-CM | POA: Insufficient documentation

## 2022-01-18 DIAGNOSIS — M79604 Pain in right leg: Secondary | ICD-10-CM | POA: Insufficient documentation

## 2022-01-18 DIAGNOSIS — M79605 Pain in left leg: Secondary | ICD-10-CM | POA: Insufficient documentation
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
## 2022-01-20 ENCOUNTER — Other Ambulatory Visit: Payer: Self-pay | Admitting: Family Medicine

## 2022-01-20 DIAGNOSIS — K7682 Hepatic encephalopathy: Secondary | ICD-10-CM

## 2022-01-21 ENCOUNTER — Telehealth: Payer: Self-pay | Admitting: Family Medicine

## 2022-01-21 NOTE — Telephone Encounter (Signed)
FYI: Pt called to apologize for missing his appt on 4/18 with ortho.  Pt states he had taken a fall a few days before this appointment, and was unable to go.  I did advise pt to always call if he cannot make appointment so he can get rescheduled.  ?I transferred pt to [facility] to get his appointment rescheduled. ?Pt did reschedule his ortho eval to 4/28. ? ? ? ? ?

## 2022-01-22 ENCOUNTER — Other Ambulatory Visit: Payer: Self-pay | Admitting: Family Medicine

## 2022-01-22 ENCOUNTER — Other Ambulatory Visit: Payer: Self-pay | Admitting: Physician Assistant

## 2022-01-22 DIAGNOSIS — F32A Depression, unspecified: Secondary | ICD-10-CM

## 2022-01-22 DIAGNOSIS — G621 Alcoholic polyneuropathy: Secondary | ICD-10-CM

## 2022-01-22 DIAGNOSIS — S22089D Unspecified fracture of T11-T12 vertebra, subsequent encounter for fracture with routine healing: Secondary | ICD-10-CM

## 2022-01-22 DIAGNOSIS — M4802 Spinal stenosis, cervical region: Secondary | ICD-10-CM

## 2022-01-24 NOTE — Telephone Encounter (Signed)
Requested Prescriptions  ?Pending Prescriptions Disp Refills  ?? gabapentin (NEURONTIN) 300 MG capsule [Pharmacy Med Name: GABAPENTIN '300MG'$  CAPSULES] 120 capsule 2  ?  Sig: TAKE 2 CAPSULES(600 MG) BY MOUTH TWICE DAILY  ?  ? Neurology: Anticonvulsants - gabapentin Passed - 01/22/2022  3:12 PM  ?  ?  Passed - Cr in normal range and within 360 days  ?  Creat  ?Date Value Ref Range Status  ?11/06/2020 0.60 (L) 0.70 - 1.33 mg/dL Final  ?  Comment:  ?  For patients >27 years of age, the reference limit ?for Creatinine is approximately 13% higher for people ?identified as African-American. ?. ?  ? ?Creatinine, Ser  ?Date Value Ref Range Status  ?12/17/2021 0.61 0.40 - 1.50 mg/dL Final  ?   ?  ?  Passed - Completed PHQ-2 or PHQ-9 in the last 360 days  ?  ?  Passed - Valid encounter within last 12 months  ?  Recent Outpatient Visits   ?      ? 2 months ago Thoracolumbar back pain  ? [place], [place]  ? 5 months ago Hyponatremia  ? [place] [facility], [place], [place]  ? 6 months ago Cervical stenosis of spine  ? [facility] Moore, [place], [place]  ? 8 months ago Closed fracture of twelfth thoracic vertebra with routine healing, unspecified fracture morphology, subsequent encounter  ? [place], Charlane Ferretti, [place]  ? 10 months ago Tobacco abuse  ? [place] Charlott Rakes, MD  ?  ?  ?Future Appointments   ?        ? In 2 weeks Charlott Rakes, MD [place]  ?  ? ?  ?  ?  ? ?

## 2022-01-24 NOTE — Telephone Encounter (Signed)
Requested medications are due for refill today.  unsure ? ?Requested medications are on the active medications list.  no ? ?Last refill. 06/30/2021 ? ?Future visit scheduled.   yes ? ?Notes to clinic.  Medication refill is not delegated. Medication was discontinued . ? ? ? ?Requested Prescriptions  ?Pending Prescriptions Disp Refills  ? acetaminophen-codeine (TYLENOL #3) 300-30 MG tablet [Pharmacy Med Name: ACETAMINOPHEN/COD #3 (300/30MG) TAB] 30 tablet   ?  Sig: TAKE 1 TABLET BY MOUTH EVERY 12 HOURS AS NEEDED FOR MODERATE PAIN  ?  ? Not Delegated - Analgesics:  Opioid Agonist Combinations 2 Failed - 01/22/2022  3:12 PM  ?  ?  Failed - This refill cannot be delegated  ?  ?  Failed - Urine Drug Screen completed in last 360 days  ?  ?  Passed - Cr in normal range and within 360 days  ?  Creat  ?Date Value Ref Range Status  ?11/06/2020 0.60 (L) 0.70 - 1.33 mg/dL Final  ?  Comment:  ?  For patients >58 years of age, the reference limit ?for Creatinine is approximately 13% higher for people ?identified as African-American. ?. ?  ? ?Creatinine, Ser  ?Date Value Ref Range Status  ?12/17/2021 0.61 0.40 - 1.50 mg/dL Final  ?  ?  ?  ?  Passed - eGFR is 10 or above and within 360 days  ?  GFR calc Af Amer  ?Date Value Ref Range Status  ?06/02/2020 114 >59 mL/min/1.73 Final  ?  Comment:  ?  **[facility] currently reports eGFR in compliance with the current** ?  recommendations of the [employer]. [facility] will ?  update reporting as new guidelines are published from the NKF-ASN ?  Task force. ?  ? ?GFR, Estimated  ?Date Value Ref Range Status  ?11/05/2021 >60 >60 mL/min Final  ?  Comment:  ?  (NOTE) ?Calculated using the CKD-EPI Creatinine Equation (2021) ?  ? ?GFR  ?Date Value Ref Range Status  ?12/17/2021 109.25 >60.00 mL/min Final  ?  Comment:  ?  Calculated using the CKD-EPI Creatinine Equation (2021)  ? ?eGFR  ?Date Value Ref Range Status  ?08/05/2021 111 >59 mL/min/1.73 Final  ?  ?  ?  ?  Passed - Patient is not  pregnant  ?  ?  Passed - Valid encounter within last 3 months  ?  Recent Outpatient Visits   ? ?      ? 2 months ago Thoracolumbar back pain  ? [place], [place]  ? 5 months ago Hyponatremia  ? [place], [place], [place]  ? 6 months ago Cervical stenosis of spine  ? [place], [place], [place]  ? 8 months ago Closed fracture of twelfth thoracic vertebra with routine healing, unspecified fracture morphology, subsequent encounter  ? [place], Charlane Ferretti, MD  ? 10 months ago Tobacco abuse  ? [place] Charlott Rakes, MD  ? ?  ?  ?Future Appointments   ? ?        ? In 2 weeks Charlott Rakes, MD [place]  ? ?  ? ? ?  ?  ?  ?  ?

## 2022-01-25 ENCOUNTER — Encounter: Payer: Self-pay | Admitting: Internal Medicine

## 2022-01-25 ENCOUNTER — Ambulatory Visit (AMBULATORY_SURGERY_CENTER): Payer: Medicaid Other | Admitting: Internal Medicine

## 2022-01-25 VITALS — BP 104/59 | HR 68 | Temp 98.4°F | Resp 20 | Ht 70.0 in | Wt 117.0 lb

## 2022-01-25 DIAGNOSIS — I8500 Esophageal varices without bleeding: Secondary | ICD-10-CM

## 2022-01-25 DIAGNOSIS — K2100 Gastro-esophageal reflux disease with esophagitis, without bleeding: Secondary | ICD-10-CM

## 2022-01-25 DIAGNOSIS — K7030 Alcoholic cirrhosis of liver without ascites: Secondary | ICD-10-CM

## 2022-01-25 DIAGNOSIS — K766 Portal hypertension: Secondary | ICD-10-CM

## 2022-01-25 DIAGNOSIS — R1084 Generalized abdominal pain: Secondary | ICD-10-CM

## 2022-01-25 MED ORDER — SODIUM CHLORIDE 0.9 % IV SOLN: 500.0000 mL | Freq: Once | INTRAVENOUS | Status: DC

## 2022-01-25 NOTE — Patient Instructions (Signed)
YOU HAD AN ENDOSCOPIC PROCEDURE TODAY AT [facility]:   Refer to the procedure report that was given to you for any specific questions about what was found during the examination.  If the procedure report does not answer your questions, please call your gastroenterologist to clarify.  If you requested that your care partner not be given the details of your procedure findings, then the procedure report has been included in a sealed envelope for you to review at your convenience later. ? ?YOU SHOULD EXPECT: Some feelings of bloating in the abdomen. Passage of more gas than usual.  Walking can help get rid of the air that was put into your GI tract during the procedure and reduce the bloating. If you had a lower endoscopy (such as a colonoscopy or flexible sigmoidoscopy) you may notice spotting of blood in your stool or on the toilet paper. If you underwent a bowel prep for your procedure, you may not have a normal bowel movement for a few days. ? ?Please Note:  You might notice some irritation and congestion in your nose or some drainage.  This is from the oxygen used during your procedure.  There is no need for concern and it should clear up in a day or so. ? ?SYMPTOMS TO REPORT IMMEDIATELY: ? ?Following upper endoscopy (EGD) ? Vomiting of blood or coffee ground material ? New chest pain or pain under the shoulder blades ? Painful or persistently difficult swallowing ? New shortness of breath ? Fever of 100?F or higher ? Black, tarry-looking stools ? ?For urgent or emergent issues, a gastroenterologist can be reached at any hour by calling [phone]. ?Do not use MyChart messaging for urgent concerns.  ? ? ?DIET:  We do recommend a small meal at first, but then you may proceed to your regular diet.  Drink plenty of fluids but you should avoid alcoholic beverages for 24 hours. ? ?ACTIVITY:  You should plan to take it easy for the rest of today and you should NOT DRIVE or use heavy machinery until  tomorrow (because of the sedation medicines used during the test).   ? ?FOLLOW UP: ?Our staff will call the number listed on your records 48-72 hours following your procedure to check on you and address any questions or concerns that you may have regarding the information given to you following your procedure. If we do not reach you, we will leave a message.  We will attempt to reach you two times.  During this call, we will ask if you have developed any symptoms of COVID 19. If you develop any symptoms (ie: fever, flu-like symptoms, shortness of breath, cough etc.) before then, please call [phone].  If you test positive for Covid 19 in the 2 weeks post procedure, please call and report this information to [place].   ? ? ?SIGNATURES/CONFIDENTIALITY: ?You and/or your care partner have signed paperwork which will be entered into your electronic medical record.  These signatures attest to the fact that that the information above on your After Visit Summary has been reviewed and is understood.  Full responsibility of the confidentiality of this discharge information lies with you and/or your care-partner.  ?

## 2022-01-25 NOTE — Progress Notes (Signed)
Vitals-CW ? ?History reviewed. Poor historian. ? ? ?

## 2022-01-25 NOTE — Progress Notes (Signed)
12/17/2021 ?Richard [name] ?[id number] ?1966/06/30 ?  ?  ?HISTORY OF PRESENT ILLNESS:  This is a 56 year old male with history of alcohol abuse, colon cancer s/p L hemicolectomy in 2007, prior prior alcoholic pancreatitis, prior alcoholic hepatitis.  He was last seen in our office in November 2021 at which time he had not been drinking.  At some point he started drinking again, however, and was seen by our service in late January 2023 when we saw him in the hospital when he presented with altered mental status, abdominal pain, back pain.  Ammonia level was elevated.  Started on lactulose 30 mL twice daily. ?  ?He is here for hospital follow-up.  They offered an EGD while he was in the hospital, but he declined so they said we could plan for as an outpatient.  He is a very poor/limited historian and does not seem to know exactly what all medications he is taking.  His list shows Lasix 20 mg daily, spironolactone 100 mg daily, pantoprazole 40 mg daily in addition to the lactulose from a GI standpoint.  Looks like he had hyoscyamine at one point for abdominal pain.  He complains of some generalized mid abdominal pain.  Once again is a poor historian. ?  ?CT scan of the abdomen and pelvis with contrast on 10/31/2021 showed the following: ?  ?IMPRESSION: ?Significant worsening of geographic pattern of steatosis throughout ?the liver, consistent with history of acute hepatitis. No evidence ?of hepatic neoplasm. ?  ?Increased mild diffuse mesenteric edema and minimal ascites. ?  ?Cholelithiasis. No radiographic evidence of cholecystitis. ?  ?Aortic Atherosclerosis (ICD10-I70.0). ?  ?  ?He tells me has not had any alcohol his since his hospital stay in 10/2021. ?  ?  ?    ?Past Medical History:  ?Diagnosis Date  ? Abnormal nuclear cardiac imaging test    ? Acid reflux    ? Acute pancreatitis 08/14/2018  ? Alcohol withdrawal delirium ([place]) 08/20/2016  ? 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 Alcoholic hepatitis    ?  Alcoholic ketoacidosis 2/95/2841  ? Ascites 12/13/2019  ? Aspiration pneumonia ([place]) 08/20/2016  ? Chest pain of uncertain etiology    ? Chronic systolic CHF (congestive heart failure) ([place])    ? Cirrhosis ([place])    ? Colon cancer ([place])    ? DCM (dilated cardiomyopathy) ([place])    ? Drug abuse ([place]) 01/07/2020  ? Elevated troponin 06/27/2018  ? ETOH abuse    ? Gastropathy 08/14/2018  ? Heme positive stool 11/13/2017  ? Hepatic steatosis 08/21/2016  ? High anion gap metabolic acidosis 12/03/4399  ? History of colon cancer    ? HTN (hypertension)    ? Hypertensive urgency 06/27/2018  ? Hypoglycemia 06/27/2018  ? Hypokalemia 01/07/2020  ? Hypomagnesemia    ? Hypophosphatemia    ? Lactic acidosis 08/20/2016  ? Leukocytosis 01/07/2020  ? Pancreatitis 08/2018  ? Polyp of ascending colon    ? Prolonged Q-T interval on ECG    ? Prolonged QT interval    ? Protein-calorie malnutrition, severe 05/02/2018  ? PUD (peptic ulcer disease)    ? SBP (spontaneous bacterial peritonitis) ([place]) 01/07/2020  ? Sepsis ([place]) 08/20/2016  ? 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 SVT (supraventricular tachycardia) ([place])    ? Thrombocytopenia ([place]) 08/21/2016  ?  ?     ?Past Surgical History:  ?Procedure Laterality Date  ? BIOPSY   12/14/2019  ?  Procedure: BIOPSY;  Surgeon: Mauri Pole, MD;  Location: [facility];  Service: Endoscopy;;  ? catherization   2007  ? COLONOSCOPY WITH PROPOFOL N/A 12/14/2019  ?  Procedure: COLONOSCOPY WITH PROPOFOL;  Surgeon: Mauri Pole, MD;  Location: [facility];  Service: Endoscopy;  Laterality: N/A;  ? ESOPHAGOGASTRODUODENOSCOPY (EGD) WITH PROPOFOL N/A 12/14/2019  ?  Procedure: ESOPHAGOGASTRODUODENOSCOPY (EGD) WITH PROPOFOL;  Surgeon: Mauri Pole, MD;  Location: [facility];  Service: Endoscopy;  Laterality: N/A;  ? HERNIA REPAIR   1969  ?  1 x at birth and at 56 years old  ? LAPAROSCOPIC SIGMOID COLECTOMY   2007  ? OPEN REDUCTION INTERNAL FIXATION (ORIF) HAND Right 2012  ?  3rd  digit  ? POLYPECTOMY    12/14/2019  ?  Procedure: POLYPECTOMY;  Surgeon: Mauri Pole, MD;  Location: [facility];  Service: Endoscopy;;  ?  ? reports that he has been smoking cigarettes. He has been smoking an average of 1 pack per day. He has never used smokeless tobacco. He reports that he does not currently use alcohol after a past usage of about 2.0 standard drinks per week. He reports current drug use. Drug: Marijuana. ?family history includes Cancer in his sister; Colon cancer in his cousin and father; Hypertension in his mother; Kidney disease in his sister. ?     ?Allergies  ?Allergen Reactions  ? Aspirin Other (See Comments)  ?    Acid reflux   ? Aspirin Other (See Comments)  ?    Caused acid reflux  ? Penicillins Hives  ?    Has patient had a PCN reaction causing immediate rash, facial/tongue/throat swelling, SOB or lightheadedness with hypotension: yes ?Has patient had a PCN reaction causing severe rash involving mucus membranes or skin necrosis: no ?Has patient had a PCN reaction that required hospitalization: no ?Has patient had a PCN reaction occurring within the last 10 years: no ?If all of the above answers are "NO", then may proceed with Cephalosporin use. ?   ? Penicillins Hives  ?  ?  ?  ?    ?Outpatient Encounter Medications as of 12/17/2021  ?Medication Sig  ? DULoxetine (CYMBALTA) 60 MG capsule Take 1 capsule (60 mg total) by mouth daily.  ? furosemide (LASIX) 20 MG tablet Take 1 tablet (20 mg total) by mouth daily.  ? gabapentin (NEURONTIN) 300 MG capsule Take 2 capsules (600 mg total) by mouth 2 (two) times daily.  ? lactulose, encephalopathy, (CHRONULAC) 10 GM/15ML SOLN Take 30 mLs (20 g total) by mouth 2 (two) times daily. TAKE 30 ML BY MOUTH TWICE DAILY ?Strength: 10 GM/15ML  ? pantoprazole (PROTONIX) 40 MG tablet Take 1 tablet (40 mg total) by mouth daily.  ? aspirin 81 MG EC tablet Take 1 tablet (81 mg total) by mouth daily. (Patient not taking: Reported on 12/17/2021)  ? Cholecalciferol (VITAMIN D-3) 125  MCG (5000 UT) TABS Take 2 PO qd x 3 months, then 1 PO qd long-term after that (Patient not taking: Reported on 12/17/2021)  ? diclofenac Sodium (VOLTAREN) 1 % GEL APPLY 2 G TOPICALLY 4 (FOUR) TIMES DAILY. (Patient not taking: Reported on 12/17/2021)  ? ferrous gluconate (FERGON) 324 MG tablet Take 1 tablet (324 mg total) by mouth daily with breakfast. (Patient not taking: Reported on 12/17/2021)  ? Hyoscyamine Sulfate SL 0.125 MG SUBL PLACE 1 TABLET (0.125 MG TOTAL) UNDER THE TONGUE EVERY 8 (EIGHT) HOURS AS NEEDED. (Patient taking differently: Place 1 tablet under the tongue daily as needed (IBS).)  ? lidocaine (LIDODERM) 5 % Place  1 patch onto the skin daily. Remove & discard patch within 12 hours or as directed by MD (Patient not taking: Reported on 12/17/2021)  ? magnesium oxide (MAG-OX) 400 (241.3 Mg) MG tablet Take 1 tablet (400 mg total) by mouth daily. (Patient not taking: Reported on 12/17/2021)  ? midodrine (PROAMATINE) 5 MG tablet Take 1 tablet (5 mg total) by mouth 3 (three) times daily with meals. (Patient not taking: Reported on 12/17/2021)  ? spironolactone (ALDACTONE) 100 MG tablet Take 1 tablet (100 mg total) by mouth daily. (Patient not taking: Reported on 12/17/2021)  ? thiamine 100 MG tablet Take 1 tablet (100 mg total) by mouth daily. (Patient not taking: Reported on 12/17/2021)  ? [DISCONTINUED] nitrofurantoin, macrocrystal-monohydrate, (MACROBID) 100 MG capsule Take 1 capsule (100 mg total) by mouth every 12 (twelve) hours. (Patient not taking: Reported on 12/17/2021)  ?  ?No facility-administered encounter medications on file as of 12/17/2021.  ?  ?  ?  ?REVIEW OF SYSTEMS  : All other systems reviewed and negative except where noted in the History of Present Illness. ?  ?  ?PHYSICAL EXAM: ?BP 110/74   Pulse 76   Ht '5\' 10"'$  (1.778 m)   Wt 117 lb 9.6 oz (53.3 kg)   SpO2 98%   BMI 16.87 kg/m?  ?General: Thin AA male in no acute distress ?Head: Normocephalic and atraumatic ?Eyes:  Sclerae anicteric,  conjunctiva pink. ?Ears: Normal auditory acuity  ?Lungs: Clear throughout to auscultation; no W/R/R. ?Heart: Regular rate and rhythm; no M/R/G. ?Abdomen: Soft, non-distended.  BS present.  Minimal diffuse TTP. ?Musculo

## 2022-01-25 NOTE — Op Note (Signed)
[facility] ?Patient Name: Richard [name] ?Procedure Date: 01/25/2022 10:08 AM ?MRN: [id number] ?Endoscopist: Docia Chuck. Henrene Pastor , MD ?Age: 56 ?Referring MD:  ?Date of Birth: Feb 17, 1966 ?Gender: Male ?Account #: [id number] ?Procedure:                Upper GI endoscopy ?Indications:              Abdominal pain, Cirrhosis rule out esophageal  ?                          varices ?Medicines:                Monitored Anesthesia Care ?Procedure:                Pre-Anesthesia Assessment: ?                          - Prior to the procedure, a History and Physical  ?                          was performed, and patient medications and  ?                          allergies were reviewed. The patient's tolerance of  ?                          previous anesthesia was also reviewed. The risks  ?                          and benefits of the procedure and the sedation  ?                          options and risks were discussed with the patient.  ?                          All questions were answered, and informed consent  ?                          was obtained. Prior Anticoagulants: The patient has  ?                          taken no previous anticoagulant or antiplatelet  ?                          agents. ASA Grade Assessment: III - A patient with  ?                          severe systemic disease. After reviewing the risks  ?                          and benefits, the patient was deemed in  ?                          satisfactory condition to undergo the procedure. ?  After obtaining informed consent, the endoscope was  ?                          passed under direct vision. Throughout the  ?                          procedure, the patient's blood pressure, pulse, and  ?                          oxygen saturations were monitored continuously. The  ?                          GIF [id number] #[id number] was introduced through the  ?                          mouth, and advanced to the second part of duodenum.  ?                           The upper GI endoscopy was accomplished without  ?                          difficulty. The patient tolerated the procedure  ?                          well. ?Scope In: ?Scope Out: ?Findings:                 Grade I varices (4 columns) were found in the lower  ?                          third of the esophagus. No stigmata. ?                          The esophagus was otherwise normal. ?                          The stomach revealed mild portal gastropathy but  ?                          was otherwise normal. ?                          The examined duodenum was normal. ?                          The cardia and gastric fundus were normal on  ?                          retroflexion. Small hiatal hernia. No proximal  ?                          gastric varices appreciated ?Complications:            No immediate complications. ?Estimated Blood Loss:     Estimated blood loss: none. ?Impression:               -  Grade I esophageal varices without stigmata. ?                          - Normal esophagus otherwise. ?                          - Mild portal gastropathy. Small hiatal hernia ?                          - Normal examined duodenum. ?                          - No specimens collected. ?Recommendation:           1. Patient has a contact number available for  ?                          emergencies. The signs and symptoms of potential  ?                          delayed complications were discussed with the  ?                          patient. Return to normal activities tomorrow.  ?                          Written discharge instructions were provided to the  ?                          patient. ?                          2. Resume previous diet. ?                          3. Continue present medications. ?                          4. No alcohol forever ?                          5. Return to the care of your primary provider ?                          6. Repeat EGD 1 year to reassess status of  varices ?Docia Chuck. Henrene Pastor, MD ?01/25/2022 10:30:35 AM ?This report has been signed electronically. ?

## 2022-01-25 NOTE — Progress Notes (Signed)
Report to PACU, RN, vss, BBS= Clear.  

## 2022-01-27 ENCOUNTER — Telehealth: Payer: Self-pay

## 2022-01-27 NOTE — Telephone Encounter (Signed)
?  Follow up Call- ? ? ?  01/25/2022  ?  9:27 AM  ?Call back number  ?Post procedure Call Back phone  # [phone]  ?Permission to leave phone message Yes  ?  ? ?Patient questions: ? ?Do you have a fever, pain , or abdominal swelling? No. ?Pain Score  0 * ? ?Have you tolerated food without any problems? Yes.   ? ?Have you been able to return to your normal activities? Yes.   ? ?Do you have any questions about your discharge instructions: ?Diet   No. ?Medications  No. ?Follow up visit  No. ? ?Do you have questions or concerns about your Care? No. ? ?Actions: ?* If pain score is 4 or above: ?No action needed, pain <4. ? ? ?
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
## 2022-01-28 ENCOUNTER — Ambulatory Visit: Payer: Medicaid Other

## 2022-01-28 DIAGNOSIS — M79605 Pain in left leg: Secondary | ICD-10-CM

## 2022-01-28 DIAGNOSIS — M79604 Pain in right leg: Secondary | ICD-10-CM

## 2022-01-28 DIAGNOSIS — M5459 Other low back pain: Secondary | ICD-10-CM

## 2022-01-28 DIAGNOSIS — M6281 Muscle weakness (generalized): Secondary | ICD-10-CM | POA: Diagnosis present

## 2022-02-08 ENCOUNTER — Telehealth: Payer: Self-pay | Admitting: Family Medicine

## 2022-02-08 ENCOUNTER — Telehealth: Payer: Self-pay

## 2022-02-08 ENCOUNTER — Ambulatory Visit: Payer: Medicaid Other | Attending: Family Medicine

## 2022-02-08 DIAGNOSIS — M79605 Pain in left leg: Secondary | ICD-10-CM | POA: Insufficient documentation

## 2022-02-08 DIAGNOSIS — M5459 Other low back pain: Secondary | ICD-10-CM | POA: Insufficient documentation

## 2022-02-08 DIAGNOSIS — M6281 Muscle weakness (generalized): Secondary | ICD-10-CM | POA: Insufficient documentation

## 2022-02-08 DIAGNOSIS — M79604 Pain in right leg: Secondary | ICD-10-CM | POA: Insufficient documentation

## 2022-02-08 DIAGNOSIS — M545 Low back pain, unspecified: Secondary | ICD-10-CM

## 2022-02-08 MED ORDER — BACLOFEN 10 MG PO TABS
10.0000 mg | ORAL_TABLET | Freq: Three times a day (TID) | ORAL | 0 refills | Status: DC
  Filled 2022-02-08: qty 90, 30d supply, fill #0

## 2022-02-08 NOTE — Telephone Encounter (Signed)
I have responded to another message from patient regarding this being in a separate encounter. ?

## 2022-02-08 NOTE — Telephone Encounter (Signed)
Medication Refill - Medication: traMADol (ULTRAM) tablet 50 mg   ? ? ?Has the patient contacted their pharmacy? Yes.   ? ?(Agent: If yes, when and what did the pharmacy advise?) need new Rx  ? ?Preferred Pharmacy (with phone number or street name):  ?[facility] [address] [facility] [address] Phone:  [phone]  ?Fax:  [phone]  ?  ? ?Has the patient been seen for an appointment in the last year OR does the patient have an upcoming appointment? Yes.   ? ?Agent: Please be advised that RX refills may take up to 3 business days. We ask that you follow-up with your pharmacy. ? ?

## 2022-02-08 NOTE — Telephone Encounter (Signed)
I had [facility] to [facility].  Please provide him with information from his chart to contact them for an appointment.  Have sent a prescription for baclofen to his pharmacy. ?

## 2022-02-08 NOTE — Telephone Encounter (Signed)
Call to patient- patient states he has not seen a big change with the Gabapentin in his pain. Patient states his pain has changed in the last couple months and he feels he has more bone pain. Patient is requesting a pain medication to take before he goes to PT. Patient advised he has upcoming appointment with provider 5/11- it may take 24- 48 hours to get response for new medication. ?

## 2022-02-08 NOTE — Telephone Encounter (Signed)
Routing to pcp for review 

## 2022-02-08 NOTE — Telephone Encounter (Signed)
Left message for pt regarding his 1st no-show. Discussed the clinic's attendance policy and confirmed his next scheduled appointment. Left the clinic's phone number. ?

## 2022-02-08 NOTE — Telephone Encounter (Signed)
Patient is requesting refill of medication not current on medication list and will require new Rx if prescribed- controled Rx ?

## 2022-02-08 NOTE — Telephone Encounter (Signed)
Copied from [place] [phone]. Topic: General - Other ?>> Feb 08, 2022 10:20 AM Richard [name] wrote: ?Reason for CRM: Patient called in to inquire of Dr Margarita Rana about getting some type of medication for pain. Per patient the gabapentin (NEURONTIN) 300 MG capsule is not helping and states that when he goes to therapy he comes out hurting bad after they are done with him and he need something that will help minimize the pain. Per patient he was on Tramadol [name] while ago but has no more. Please advise and call patient would like to speak to the nurse  at  Ph# [phone] ?
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
## 2022-02-09 ENCOUNTER — Other Ambulatory Visit: Payer: Self-pay

## 2022-02-09 NOTE — Telephone Encounter (Signed)
Noted  

## 2022-02-09 NOTE — Telephone Encounter (Signed)
Called pt stated he is aware of [facility] and is not much he has been done, and the injections only help for the moment.  ?

## 2022-02-10 ENCOUNTER — Encounter: Payer: Self-pay | Admitting: Family Medicine

## 2022-02-10 ENCOUNTER — Ambulatory Visit: Payer: Medicaid Other | Attending: Family Medicine | Admitting: Family Medicine

## 2022-02-10 DIAGNOSIS — F32A Depression, unspecified: Secondary | ICD-10-CM

## 2022-02-10 DIAGNOSIS — G621 Alcoholic polyneuropathy: Secondary | ICD-10-CM | POA: Diagnosis not present

## 2022-02-10 DIAGNOSIS — K7030 Alcoholic cirrhosis of liver without ascites: Secondary | ICD-10-CM | POA: Diagnosis not present

## 2022-02-10 MED ORDER — GABAPENTIN 300 MG PO CAPS: 600.0000 mg | ORAL_CAPSULE | Freq: Three times a day (TID) | ORAL | 2 refills | Status: DC

## 2022-02-10 NOTE — Progress Notes (Signed)
? ?Virtual Visit via Telephone Note ? ?I connected with Richard [name], on 02/10/2022 at 10:50 AM by telephone and verified that I am speaking with the correct person using two identifiers. ?  ?Consent: ?I discussed the limitations, risks, security and privacy concerns of performing an evaluation and management service by telephone and the availability of in person appointments. I also discussed with the patient that there may be a patient responsible charge related to this service. The patient expressed understanding and agreed to proceed. ? ? ?Location of Patient: ?Home ? ?Location of Provider: ?Clinic ? ? ?Persons participating in Telemedicine visit: ?Richard [name] ?Dr. Margarita Rana ? ? ? ? ?History of Present Illness: ?Richard [name] is a 56 y.o. year old male with a history of peptic ulcer disease, alcoholic liver cirrhosis, GERD, SVT, dilated cardiomyopathy (EF 60-65 %), Depression, left eye blindness.  ?  ? ?Complains of gas in his abdomen and associated tightness. States his last bowel movement was yesterday. States he takes Lactulose twice daily and sometimes has to skip his lactulose if he has doctors appointments to prevent having loose stools. ?He has feet swelling but denies abdominal distention.  Endorses compliance with his spironolactone, Lasix, PPI. ?He had an upper endoscopy last month which revealed: ?- Grade I esophageal varices without stigmata. ?- Normal esophagus otherwise. ?- Mild portal gastropathy. Small hiatal hernia ?- Normal examined duodenum. ?- No specimens collected. ? ? ?He has a burning feeling in his feet and complains of feeling off balance and falling even when using his cane.  Medication list reveals he is on Cymbalta and gabapentin which he endorses taking.  States he has not consumed alcohol since 11/2021.  Currently undergoing PT. ?He is a poor historian ?Past Medical History:  ?Diagnosis Date  ? Abnormal nuclear cardiac imaging test   ? Acid reflux   ? Acute  pancreatitis 08/14/2018  ? Alcohol withdrawal delirium ([place]) 08/20/2016  ? Alcoholic cardiomyopathy ([place]) 01/07/2020  ? Alcoholic hepatitis   ? Alcoholic ketoacidosis 04/02/7792  ? Ascites 12/13/2019  ? Aspiration pneumonia ([place]) 08/20/2016  ? Chest pain of uncertain etiology   ? Chronic systolic CHF (congestive heart failure) ([place])   ? Cirrhosis ([place])   ? Colon cancer ([place])   ? DCM (dilated cardiomyopathy) ([place])   ? Drug abuse ([place]) 01/07/2020  ? Elevated troponin 06/27/2018  ? ETOH abuse   ? Gastropathy 08/14/2018  ? Heme positive stool 11/13/2017  ? Hepatic steatosis 08/21/2016  ? High anion gap metabolic acidosis 9/0/3009  ? History of colon cancer   ? HTN (hypertension)   ? Hypertensive urgency 06/27/2018  ? Hypoglycemia 06/27/2018  ? Hypokalemia 01/07/2020  ? Hypomagnesemia   ? Hypophosphatemia   ? Lactic acidosis 08/20/2016  ? Leukocytosis 01/07/2020  ? Pancreatitis 08/2018  ? Polyp of ascending colon   ? Prolonged Q-T interval on ECG   ? Prolonged QT interval   ? Protein-calorie malnutrition, severe 05/02/2018  ? PUD (peptic ulcer disease)   ? SBP (spontaneous bacterial peritonitis) ([place]) 01/07/2020  ? Sepsis ([place]) 08/20/2016  ? 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 SVT (supraventricular tachycardia) ([place])   ? Thrombocytopenia ([place]) 08/21/2016  ? ?Allergies  ?Allergen Reactions  ? Aspirin Other (See Comments)  ?  Acid reflux   ? Aspirin Other (See Comments)  ?  Caused acid reflux  ? Penicillins Hives  ?  Has patient had a PCN reaction causing immediate rash, facial/tongue/throat swelling, SOB or lightheadedness with hypotension: yes ?Has patient had a  PCN reaction causing severe rash involving mucus membranes or skin necrosis: no ?Has patient had a PCN reaction that required hospitalization: no ?Has patient had a PCN reaction occurring within the last 10 years: no ?If all of the above answers are "NO", then may proceed with Cephalosporin use. ?  ? Penicillins Hives  ? ? ?Current Outpatient Medications on File Prior to Visit   ?Medication Sig Dispense Refill  ? aspirin 81 MG EC tablet Take 1 tablet (81 mg total) by mouth daily. 90 tablet 3  ? baclofen (LIORESAL) 10 MG tablet Take 1 tablet (10 mg total) by mouth 3 (three) times daily. 90 each 0  ? Cholecalciferol (VITAMIN D-3) 125 MCG (5000 UT) TABS Take 2 PO qd x 3 months, then 1 PO qd long-term after that 180 tablet 3  ? diclofenac Sodium (VOLTAREN) 1 % GEL APPLY 2 G TOPICALLY 4 (FOUR) TIMES DAILY. 100 g 1  ? DULoxetine (CYMBALTA) 60 MG capsule Take 1 capsule (60 mg total) by mouth daily. 30 capsule 3  ? ferrous gluconate (FERGON) 324 MG tablet Take 1 tablet (324 mg total) by mouth daily with breakfast. 30 tablet 2  ? furosemide (LASIX) 20 MG tablet Take 1 tablet (20 mg total) by mouth daily. 30 tablet 3  ? gabapentin (NEURONTIN) 300 MG capsule TAKE 2 CAPSULES(600 MG) BY MOUTH TWICE DAILY 120 capsule 2  ? Hyoscyamine Sulfate SL 0.125 MG SUBL Take 1 tablet ( 0.125 mg)  by mouth 2 (two) times daily. 60 tablet 2  ? lactulose, encephalopathy, (CHRONULAC) 10 GM/15ML SOLN TAKE 30 ML BY MOUTH TWICE DAILY 946 mL 0  ? lidocaine (LIDODERM) 5 % Place 1 patch onto the skin daily. Remove & discard patch within 12 hours or as directed by MD 30 patch 6  ? magnesium oxide (MAG-OX) 400 (241.3 Mg) MG tablet Take 1 tablet (400 mg total) by mouth daily. 100 tablet 3  ? midodrine (PROAMATINE) 5 MG tablet Take 1 tablet (5 mg total) by mouth 3 (three) times daily with meals. 90 tablet 0  ? pantoprazole (PROTONIX) 40 MG tablet Take 1 tablet (40 mg total) by mouth daily. 30 tablet 3  ? spironolactone (ALDACTONE) 100 MG tablet Take 1 tablet (100 mg total) by mouth daily. 30 tablet 2  ? thiamine 100 MG tablet Take 1 tablet (100 mg total) by mouth daily. 30 tablet 1  ? ?No current facility-administered medications on file prior to visit.  ? ? ?ROS: ?See HPI ? ?Observations/Objective: ?Awake, alert, oriented x3 ?Not in acute distress ?Normal mood ? ? ? ?  Latest Ref Rng & Units 12/17/2021  ? 12:00 PM 11/05/2021  ?   3:07 AM 11/04/2021  ?  3:22 AM  ?CMP  ?Glucose 70 - 99 mg/dL 86   94   87    ?BUN 6 - 23 mg/dL 7   5   <5    ?Creatinine 0.40 - 1.50 mg/dL 0.61   0.61   0.59    ?Sodium 135 - 145 mEq/L 135   131   132    ?Potassium 3.5 - 5.1 mEq/L 3.6   3.9   4.3    ?Chloride 96 - 112 mEq/L 101   106   104    ?CO2 19 - 32 mEq/L '26   20   21    '$ ?Calcium 8.4 - 10.5 mg/dL 10.3   8.9   8.9    ?Total Protein 6.0 - 8.3 g/dL 8.7   5.8  5.8    ?Total Bilirubin 0.2 - 1.2 mg/dL 1.1   2.5   3.3    ?Alkaline Phos 39 - 117 U/L 133   102   74    ?AST 0 - 37 U/L 25   124   145    ?ALT 0 - 53 U/L 11   87   91    ? ? ?Lipid Panel  ?   ?Component Value Date/Time  ? CHOL 134 01/11/2020 0640  ? TRIG 149 01/11/2020 0640  ? HDL <10 (L) 01/11/2020 0640  ? CHOLHDL NOT CALCULATED 01/11/2020 0640  ? VLDL 30 01/11/2020 0640  ? [place] NOT CALCULATED 01/11/2020 0640  ? ? ?Lab Results  ?Component Value Date  ? HGBA1C 4.6 11/06/2020  ? ? ?Assessment and Plan: ?1. Alcoholic peripheral neuropathy ([place]) ?Uncontrolled ?Increase dose of gabapentin from 600 mg twice daily to 600 mg 3 times daily ?Continue Cymbalta ?- gabapentin (NEURONTIN) 300 MG capsule; Take 2 capsules (600 mg total) by mouth 3 (three) times daily.  Dispense: 180 capsule; Refill: 2 ? ?2. Depressive disorder ?Stable ?Continue Cymbalta ?- gabapentin (NEURONTIN) 300 MG capsule; Take 2 capsules (600 mg total) by mouth 3 (three) times daily.  Dispense: 180 capsule; Refill: 2 ? ?3.  Alcoholic liver cirrhosis ?Recent EGD revealed presence of varices, portal gastropathy ?Continue PPI, spironolactone, Lasix ?Also on hyoscyamine for abdominal pain ?Advised to contact GI if abdominal pain returns. ? ?Follow Up Instructions: ?3 months ?  ?I discussed the assessment and treatment plan with the patient. The patient was provided an opportunity to ask questions and all were answered. The patient agreed with the plan and demonstrated an understanding of the instructions. ?  ?The patient was advised to call back or  seek an in-person evaluation if the symptoms worsen or if the condition fails to improve as anticipated. ? ? ? ? ?I provided 16 minutes total of non-face-to-face time during this encounter. ? ? ?Charlott Rakes, MD
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
## 2022-02-11 ENCOUNTER — Ambulatory Visit: Payer: Medicaid Other

## 2022-02-11 ENCOUNTER — Telehealth: Payer: Self-pay

## 2022-02-11 NOTE — Telephone Encounter (Signed)
Left message for pt regarding his 2nd no show. Discussed the clinic's attendance policy and confirmed his next scheduled appointment and the clinic phone number. Cancelled all future appointments except his closest remaining appointment for next Tuesday. ?

## 2022-02-15 ENCOUNTER — Ambulatory Visit: Payer: Medicaid Other

## 2022-02-15 DIAGNOSIS — M79604 Pain in right leg: Secondary | ICD-10-CM

## 2022-02-15 DIAGNOSIS — M79605 Pain in left leg: Secondary | ICD-10-CM

## 2022-02-15 DIAGNOSIS — M6281 Muscle weakness (generalized): Secondary | ICD-10-CM | POA: Diagnosis present

## 2022-02-15 DIAGNOSIS — M5459 Other low back pain: Secondary | ICD-10-CM
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
## 2022-02-16 ENCOUNTER — Other Ambulatory Visit: Payer: Self-pay

## 2022-02-17 ENCOUNTER — Other Ambulatory Visit: Payer: Self-pay

## 2022-02-17 DIAGNOSIS — K7011 Alcoholic hepatitis with ascites: Secondary | ICD-10-CM

## 2022-02-17 MED ORDER — PANTOPRAZOLE SODIUM 40 MG PO TBEC
40.0000 mg | DELAYED_RELEASE_TABLET | Freq: Every day | ORAL | 0 refills | Status: DC
  Filled 2022-02-17: qty 90, 90d supply, fill #0

## 2022-02-17 MED ORDER — FUROSEMIDE 20 MG PO TABS
20.0000 mg | ORAL_TABLET | Freq: Every day | ORAL | 0 refills | Status: DC
  Filled 2022-02-17: qty 90, 90d supply, fill #0
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
## 2022-02-17 NOTE — Telephone Encounter (Signed)
Requested Prescriptions  Pending Prescriptions Disp Refills  . furosemide (LASIX) 20 MG tablet 30 tablet 3    Sig: Take 1 tablet (20 mg total) by mouth daily.     Cardiovascular:  Diuretics - Loop Passed - 02/17/2022  4:00 PM      Passed - K in normal range and within 180 days    Potassium  Date Value Ref Range Status  12/17/2021 3.6 3.5 - 5.1 mEq/L Final         Passed - Ca in normal range and within 180 days    Calcium  Date Value Ref Range Status  12/17/2021 10.3 8.4 - 10.5 mg/dL Final   Calcium, Ion  Date Value Ref Range Status  04/03/2021 1.06 (L) 1.15 - 1.40 mmol/L Final         Passed - Na in normal range and within 180 days    Sodium  Date Value Ref Range Status  12/17/2021 135 135 - 145 mEq/L Final  08/05/2021 139 134 - 144 mmol/L Final         Passed - Cr in normal range and within 180 days    Creat  Date Value Ref Range Status  11/06/2020 0.60 (L) 0.70 - 1.33 mg/dL Final    Comment:    For patients >71 years of age, the reference limit for Creatinine is approximately 13% higher for people identified as African-American. .    Creatinine, Ser  Date Value Ref Range Status  12/17/2021 0.61 0.40 - 1.50 mg/dL Final         Passed - Cl in normal range and within 180 days    Chloride  Date Value Ref Range Status  12/17/2021 101 96 - 112 mEq/L Final         Passed - Mg Level in normal range and within 180 days    Magnesium  Date Value Ref Range Status  10/31/2021 1.9 1.7 - 2.4 mg/dL Final    Comment:    Performed at [facility], [place] [address] [place], [place] [address]         Passed - Last BP in normal range    BP Readings from Last 1 Encounters:  01/25/22 (!) 104/59         Passed - Valid encounter within last 6 months    Recent Outpatient Visits          1 week ago Alcoholic cirrhosis of liver without ascites ([place])   [place], Enobong, MD   3 months ago Thoracolumbar back pain   [place], Charlane Ferretti, MD   6 months ago Hyponatremia   [place], [place], [place]   7 months ago Cervical stenosis of spine   [place], [name] M, [place]   9 months ago Closed fracture of twelfth thoracic vertebra with routine healing, unspecified fracture morphology, subsequent encounter   [place], [place], MD             . pantoprazole (PROTONIX) 40 MG tablet 30 tablet 3    Sig: Take 1 tablet (40 mg total) by mouth daily.     Gastroenterology: Proton Pump Inhibitors Passed - 02/17/2022  4:00 PM      Passed - Valid encounter within last 12 months    Recent Outpatient Visits          1  week ago Alcoholic cirrhosis of liver without ascites ([place])   [place], Enobong, MD   3 months ago Thoracolumbar back pain   [place], Enobong, MD   6 months ago Hyponatremia   [place], [place], [place]   7 months ago Cervical stenosis of spine   [place], [name] M, [place]   9 months ago Closed fracture of twelfth thoracic vertebra with routine healing, unspecified fracture morphology, subsequent encounter   [facility] Charlott Rakes, MD

## 2022-02-18 ENCOUNTER — Ambulatory Visit: Payer: Medicaid Other | Attending: Internal Medicine | Admitting: Internal Medicine

## 2022-02-18 DIAGNOSIS — G8929 Other chronic pain: Secondary | ICD-10-CM

## 2022-02-18 NOTE — Progress Notes (Signed)
Virtual Visit via Telephone Note  I connected with Richard [name] on 02/18/2022 at 8:20 a.m by telephone and verified that I am speaking with the correct person using two identifiers  Location: Patient: home Provider: office  Participants: Myself Patient   I discussed the limitations, risks, security and privacy concerns of performing an evaluation and management service by telephone and the availability of in person appointments. I also discussed with the patient that there may be a patient responsible charge related to this service. The patient expressed understanding and agreed to proceed.     History of Present Illness: history of peptic ulcer disease, alcoholic liver cirrhosis, GERD, SVT, dilated cardiomyopathy (EF 60-65 %), Depression, left eye blindness.  PCP is Dr. Margarita Rana whom he saw 02/10/2022.  This is an urgent care visit. This visit was scheduled to address pain issues.  However I was barely able to understand pt.  Pt mumbles.  I kept having to ask pt to repeat. Only thing I was able to make out was that he feels gabapentin is not doing anything and he is miserable. I inquired whether he saw pain specialist as Dr. Margarita Rana had referred him.  I think he said the specialist wanted to do an injection to his back that may paralyze him.   Outpatient Encounter Medications as of 02/18/2022  Medication Sig   aspirin 81 MG EC tablet Take 1 tablet (81 mg total) by mouth daily.   baclofen (LIORESAL) 10 MG tablet Take 1 tablet (10 mg total) by mouth 3 (three) times daily.   Cholecalciferol (VITAMIN D-3) 125 MCG (5000 UT) TABS Take 2 PO qd x 3 months, then 1 PO qd long-term after that   diclofenac Sodium (VOLTAREN) 1 % GEL APPLY 2 G TOPICALLY 4 (FOUR) TIMES DAILY.   DULoxetine (CYMBALTA) 60 MG capsule Take 1 capsule (60 mg total) by mouth daily.   ferrous gluconate (FERGON) 324 MG tablet Take 1 tablet (324 mg total) by mouth daily with breakfast.   furosemide (LASIX) 20 MG tablet Take 1  tablet (20 mg total) by mouth daily.   gabapentin (NEURONTIN) 300 MG capsule Take 2 capsules (600 mg total) by mouth 3 (three) times daily.   Hyoscyamine Sulfate SL 0.125 MG SUBL Take 1 tablet ( 0.125 mg)  by mouth 2 (two) times daily.   lactulose, encephalopathy, (CHRONULAC) 10 GM/15ML SOLN TAKE 30 ML BY MOUTH TWICE DAILY   lidocaine (LIDODERM) 5 % Place 1 patch onto the skin daily. Remove & discard patch within 12 hours or as directed by MD   magnesium oxide (MAG-OX) 400 (241.3 Mg) MG tablet Take 1 tablet (400 mg total) by mouth daily.   midodrine (PROAMATINE) 5 MG tablet Take 1 tablet (5 mg total) by mouth 3 (three) times daily with meals.   pantoprazole (PROTONIX) 40 MG tablet Take 1 tablet (40 mg total) by mouth daily.   spironolactone (ALDACTONE) 100 MG tablet Take 1 tablet (100 mg total) by mouth daily.   thiamine 100 MG tablet Take 1 tablet (100 mg total) by mouth daily.   No facility-administered encounter medications on file as of 02/18/2022.      Observations/Objective: No direct observation done as this was a telephone visit.  Assessment and Plan: 1. Other chronic pain I was not able to get a good history due to not being able to understand him fully over the telephone.  I advised that he schedule a follow-up visit with his PCP which I will facilitate.   Follow  Up Instructions:    I discussed the assessment and treatment plan with the patient. The patient was provided an opportunity to ask questions and all were answered. The patient agreed with the plan and demonstrated an understanding of the instructions.   The patient was advised to call back or seek an in-person evaluation if the symptoms worsen or if the condition fails to improve as anticipated.  I  Spent 4 minutes on this telephone encounter  This note has been created with Surveyor, quantity. Any transcriptional errors are unintentional.  Karle Plumber, MD

## 2022-02-22 ENCOUNTER — Other Ambulatory Visit: Payer: Self-pay | Admitting: Family Medicine

## 2022-02-22 ENCOUNTER — Other Ambulatory Visit: Payer: Self-pay

## 2022-02-22 DIAGNOSIS — G629 Polyneuropathy, unspecified: Secondary | ICD-10-CM

## 2022-02-23 ENCOUNTER — Other Ambulatory Visit: Payer: Self-pay

## 2022-02-23 ENCOUNTER — Ambulatory Visit: Payer: Self-pay | Admitting: *Deleted

## 2022-02-23 ENCOUNTER — Telehealth: Payer: Medicaid Other | Admitting: Physician Assistant

## 2022-02-23 ENCOUNTER — Ambulatory Visit: Payer: Medicaid Other | Admitting: Physician Assistant

## 2022-02-23 NOTE — Telephone Encounter (Signed)
Change in therapy 11/04/20.  Requested Prescriptions  Pending Prescriptions Disp Refills  . gabapentin (NEURONTIN) 300 MG capsule 120 capsule 0    Sig: TAKE 1 CAPSULE (300 MG TOTAL) BY MOUTH 2 (TWO) TIMES DAILY.     Neurology: Anticonvulsants - gabapentin Passed - 02/22/2022 12:58 PM      Passed - Cr in normal range and within 360 days    Creat  Date Value Ref Range Status  11/06/2020 0.60 (L) 0.70 - 1.33 mg/dL Final    Comment:    For patients >15 years of age, the reference limit for Creatinine is approximately 13% higher for people identified as African-American. .    Creatinine, Ser  Date Value Ref Range Status  12/17/2021 0.61 0.40 - 1.50 mg/dL Final         Passed - Completed PHQ-2 or PHQ-9 in the last 360 days      Passed - Valid encounter within last 12 months    Recent Outpatient Visits          5 days ago Other chronic pain   [place] Karle Plumber B, MD   1 week ago Alcoholic cirrhosis of liver without ascites [facility])   [place], Enobong, MD   3 months ago Thoracolumbar back pain   [place], Enobong, MD   6 months ago Hyponatremia   [place], [place]   7 months ago Cervical stenosis of spine   [place], Dionne Bucy, [place]      Future Appointments            Today Thereasa Solo, Cas[place] B[place]

## 2022-02-23 NOTE — Telephone Encounter (Signed)
  Chief Complaint: Chronic pain.   Gabapentin not helping.   Feels like he is taking too many pills a day of gabapentin Symptoms: pain in feet and lower body from nerve damage Frequency: Constant Pertinent Negatives: Patient denies N/A Disposition: '[]'$ ED /'[]'$ Urgent Care (no appt availability in office) / '[x]'$ Appointment(In office/virtual)/ '[]'$  [facility]/ '[]'$ Home Care/ '[]'$ Refused Recommended Disposition /'[]'$ [facility]/ '[]'$  Follow-up with PCP Additional Notes: Appt made for today with Richard Caldron, PA as a phone call because pt not have a computer and doesn't have a way into the office to do an in person visit today.
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
## 2022-02-24 ENCOUNTER — Ambulatory Visit: Payer: Medicaid Other

## 2022-02-24 ENCOUNTER — Ambulatory Visit: Payer: Self-pay | Admitting: *Deleted

## 2022-02-24 NOTE — Telephone Encounter (Signed)
  Chief Complaint: Foot pain Symptoms: Feet "Burning"Can't wear shoes, gabapentin not working, need something for pain." Frequency: Gradual worsening since last March Pertinent Negatives: Patient denies  Disposition: '[]'$ ED /'[]'$ Urgent Care (no appt availability in office) / '[x]'$ Appointment(In office/virtual)/ '[]'$  [facility]/ '[]'$ Home Care/ '[]'$ Refused Recommended Disposition /'[]'$ [facility]/ '[]'$  Follow-up with PCP Additional Notes: Agent secured first available prior to triage. Pt missed virtual yesterday " I stayed by the phone but never got a call." Requesting something for pain until OV 03/01/22. Placed on Wait List. Please advise. Thank you.Reason for Disposition  Foot pain is a chronic symptom (recurrent or ongoing AND present > 4 weeks)  Answer Assessment - Initial Assessment Questions 1. ONSET: "When did the pain start?"      Gradually worsening since last MArch 2. LOCATION: "Where is the pain located?"      Both feet 3. PAIN: "How bad is the pain?"    (Scale 1-10; or mild, moderate, severe)  - MILD (1-3): doesn't interfere with normal activities.   - MODERATE (4-7): interferes with normal activities (e.g., work or school) or awakens from sleep, limping.   - SEVERE (8-10): excruciating pain, unable to do any normal activities, unable to walk.      "Burning type pain" 4. WORK OR EXERCISE: "Has there been any recent work or exercise that involved this part of the body?"      No 5. CAUSE: "What do you think is causing the foot pain?"     Neuropathy. 6. OTHER SYMPTOMS: "Do you have any other symptoms?" (e.g., leg pain, rash, fever, numbness)  Protocols used: Foot Pain-A-AH

## 2022-03-01 ENCOUNTER — Ambulatory Visit: Payer: Medicaid Other | Admitting: Family Medicine

## 2022-03-18 ENCOUNTER — Other Ambulatory Visit: Payer: Self-pay | Admitting: Family Medicine

## 2022-03-18 DIAGNOSIS — F32A Depression, unspecified: Secondary | ICD-10-CM

## 2022-03-18 DIAGNOSIS — G621 Alcoholic polyneuropathy: Secondary | ICD-10-CM

## 2022-03-18 DIAGNOSIS — K7682 Hepatic encephalopathy: Secondary | ICD-10-CM

## 2022-03-18 MED ORDER — GABAPENTIN 300 MG PO CAPS: 600.0000 mg | ORAL_CAPSULE | Freq: Three times a day (TID) | ORAL | 2 refills | Status: DC

## 2022-03-18 MED ORDER — LACTULOSE ENCEPHALOPATHY 10 GM/15ML PO SOLN: ORAL | 0 refills | Status: DC

## 2022-03-18 NOTE — Telephone Encounter (Signed)
Attempted to contact patient to review medication requests and rang 3-4  times  , recording call can not be completed at this time hang up and call again later.

## 2022-03-18 NOTE — Telephone Encounter (Signed)
Requested medication (s) are due for refill today: no   Requested medication (s) are on the active medication list: yes   Last refill:  Neurontin- 02/10/22 #180 2 refills , lactulose- 01/20/22 #946 ml 0 refills.  Future visit scheduled: yes in 5 days  Notes to clinic:  patient should have refills. Pt called requesting refills. Do you want to refill Rxs? Called patient to verify and unable to reach pharmacy at this time.      Requested Prescriptions  Pending Prescriptions Disp Refills   gabapentin (NEURONTIN) 300 MG capsule 180 capsule 2    Sig: Take 2 capsules (600 mg total) by mouth 3 (three) times daily.     Neurology: Anticonvulsants - gabapentin Passed - 03/18/2022 11:29 AM      Passed - Cr in normal range and within 360 days    Creat  Date Value Ref Range Status  11/06/2020 0.60 (L) 0.70 - 1.33 mg/dL Final    Comment:    For patients >91 years of age, the reference limit for Creatinine is approximately 13% higher for people identified as African-American. .    Creatinine, Ser  Date Value Ref Range Status  12/17/2021 0.61 0.40 - 1.50 mg/dL Final         Passed - Completed PHQ-2 or PHQ-9 in the last 360 days      Passed - Valid encounter within last 12 months    Recent Outpatient Visits           4 weeks ago Other chronic pain   [place], Dalbert Batman, MD   1 month ago Alcoholic cirrhosis of liver without ascites ([place])   [place], Charlane Ferretti, MD   4 months ago Thoracolumbar back pain   [place], Charlane Ferretti, MD   7 months ago Hyponatremia   [place], [place], [place]   8 months ago Cervical stenosis of spine   [place], Dionne Bucy, [place]       Future Appointments             In 5 days [place], Dionne Bucy, PA-C [place]              lactulose, encephalopathy, (CHRONULAC) 10 GM/15ML SOLN 946 mL 0     Gastroenterology:  Laxatives - lactulose Passed - 03/18/2022 11:29 AM      Passed - Cl in normal range and within 360 days    Chloride  Date Value Ref Range Status  12/17/2021 101 96 - 112 mEq/L Final         Passed - CO2 in normal range and within 360 days    CO2  Date Value Ref Range Status  12/17/2021 26 19 - 32 mEq/L Final   Bicarbonate  Date Value Ref Range Status  08/14/2018 17.5 (L) 20.0 - 28.0 mmol/L Final         Passed - K in normal range and within 360 days    Potassium  Date Value Ref Range Status  12/17/2021 3.6 3.5 - 5.1 mEq/L Final         Passed - Na in normal range and within 360 days    Sodium  Date Value Ref Range Status  12/17/2021 135 135 - 145 mEq/L Final  08/05/2021 139 134 - 144 mmol/L Final  Passed - Valid encounter within last 12 months    Recent Outpatient Visits           4 weeks ago Other chronic pain   [place] Ladell Pier, MD   1 month ago Alcoholic cirrhosis of liver without ascites [facility])   [place], Enobong, MD   4 months ago Thoracolumbar back pain   [place], Enobong, MD   7 months ago Hyponatremia   [place], [place]   8 months ago Cervical stenosis of spine   [place], Dionne Bucy, [place]       Future Appointments             In 5 days [place], Dionne Bucy, PA-C [place]

## 2022-03-18 NOTE — Telephone Encounter (Signed)
Copied from [place] [id number]. Topic: General - Other >> Mar 18, 2022  8:40 AM Everette C wrote: Reason for CRM: Medication Refill - Medication: gabapentin (NEURONTIN) 300 MG capsule [18308] gabapentin (NEURONTIN) 300 MG capsule [[id number]]   lactulose, encephalopathy, ([place]) 10 GM/15ML SOLN [[id number]]    Has the patient contacted their pharmacy? Yes. The patient has been in contact with their pharmacy and was directed to contact their PCP (Agent: If no, request that the patient contact the pharmacy for the refill. If patient does not wish to contact the pharmacy document the reason why and proceed with request.) (Agent: If yes, when and what did the pharmacy advise?)  Preferred Pharmacy (with phone number or street name): [facility] [place], [place] [address] [place] [address] Phone: [phone] Fax: [phone] Hours: Not open 24 hours   Has the patient been seen for an appointment in the last year OR does the patient have an upcoming appointment? Yes.    Agent: Please be advised that RX refills may take up to 3 business days. We ask that you follow-up with your pharmacy.

## 2022-03-18 NOTE — Telephone Encounter (Signed)
Called pharmacy to verify when patient last dispensed medication. On hold greater than 3 mnutes unable to continue to hold.

## 2022-03-23 ENCOUNTER — Ambulatory Visit: Payer: Medicaid Other | Admitting: Physician Assistant

## 2022-03-25 ENCOUNTER — Telehealth: Payer: Medicaid Other | Admitting: Family Medicine

## 2022-03-25 ENCOUNTER — Other Ambulatory Visit: Payer: Self-pay

## 2022-03-25 NOTE — Telephone Encounter (Signed)
Richard [name],   I am so sorry for bothering you so much today. This patient is stating his gabapentin and Lactulose solution is not covered but he has Medicaid? Do we need to submit a PA for these?

## 2022-03-25 NOTE — Telephone Encounter (Signed)
Pt is calling to report that the gabapentin (NEURONTIN) 300 MG capsule [[id number]]  & lactulose, encephalopathy, (CHRONULAC) 10 GM/15ML SOLN [[id number]]  is not covered by the pts insurance.   Pt would like know if he can receive Tylenol 3 instead of the fabapentin Please advise CB- [phone]

## 2022-04-02 ENCOUNTER — Other Ambulatory Visit: Payer: Self-pay | Admitting: Family Medicine

## 2022-04-02 DIAGNOSIS — K7682 Hepatic encephalopathy: Secondary | ICD-10-CM

## 2022-04-04 NOTE — Telephone Encounter (Signed)
Requested medication (s) are due for refill today: yes  Requested medication (s) are on the active medication list: yes  Last refill:  03/18/22 946 ml with 0 RF take 41m twice a day.  Future visit scheduled: no, seen 02/18/22, no show 03/23/22  Notes to clinic:  This was a two week supply. Was not sure if that was on purpose, not sure if insurance will cover it again, please assess.      Requested Prescriptions  Pending Prescriptions Disp Refills   lactulose ([place] GM/15ML solution [Pharmacy Med Name: LACTULOSE 10GM/15ML SOLUTION] 946 mL     Sig: TAKE 30 ML BY MOUTH TWICE DAILY     Gastroenterology:  Laxatives - lactulose Passed - 04/02/2022  3:59 AM      Passed - Cl in normal range and within 360 days    Chloride  Date Value Ref Range Status  12/17/2021 101 96 - 112 mEq/L Final         Passed - CO2 in normal range and within 360 days    CO2  Date Value Ref Range Status  12/17/2021 26 19 - 32 mEq/L Final   Bicarbonate  Date Value Ref Range Status  08/14/2018 17.5 (L) 20.0 - 28.0 mmol/L Final         Passed - K in normal range and within 360 days    Potassium  Date Value Ref Range Status  12/17/2021 3.6 3.5 - 5.1 mEq/L Final         Passed - Na in normal range and within 360 days    Sodium  Date Value Ref Range Status  12/17/2021 135 135 - 145 mEq/L Final  08/05/2021 139 134 - 144 mmol/L Final         Passed - Valid encounter within last 12 months    Recent Outpatient Visits           1 month ago Other chronic pain   [place] Deborah B, MD   1 month ago Alcoholic cirrhosis of liver without ascites ([place]   [place], MD   4 months ago Thoracolumbar back pain   [place], [place]   8 months ago Hyponatremia   [place]  9 months ago Cervical stenosis of spine   [place]

## 2022-04-23 ENCOUNTER — Other Ambulatory Visit: Payer: Self-pay | Admitting: Family Medicine

## 2022-04-23 DIAGNOSIS — K7682 Hepatic encephalopathy: Secondary | ICD-10-CM

## 2022-04-25 NOTE — Telephone Encounter (Signed)
Requested Prescriptions  Pending Prescriptions Disp Refills  . lactulose (CHRONULAC) 10 GM/15ML solution [Pharmacy Med Name: LACTULOSE 10GM/15ML SOLUTION] 946 mL 0    Sig: TAKE 30 ML BY MOUTH TWICE DAILY     Gastroenterology:  Laxatives - lactulose Passed - 04/23/2022  3:57 AM      Passed - Cl in normal range and within 360 days    Chloride  Date Value Ref Range Status  12/17/2021 101 96 - 112 mEq/L Final         Passed - CO2 in normal range and within 360 days    CO2  Date Value Ref Range Status  12/17/2021 26 19 - 32 mEq/L Final   Bicarbonate  Date Value Ref Range Status  08/14/2018 17.5 (L) 20.0 - 28.0 mmol/L Final         Passed - K in normal range and within 360 days    Potassium  Date Value Ref Range Status  12/17/2021 3.6 3.5 - 5.1 mEq/L Final         Passed - Na in normal range and within 360 days    Sodium  Date Value Ref Range Status  12/17/2021 135 135 - 145 mEq/L Final  08/05/2021 139 134 - 144 mmol/L Final         Passed - Valid encounter within last 12 months    Recent Outpatient Visits          2 months ago Other chronic pain   [place], Deborah B, MD   2 months ago Alcoholic cirrhosis of liver without ascites [facility])   [place], Enobong, MD   5 months ago Thoracolumbar back pain   [place], Enobong, MD   8 months ago Hyponatremia   [place], [place]   9 months ago Cervical stenosis of spine   [place], Dionne Bucy, [place]      Future Appointments            In 2 months Charlott Rakes, MD [place]

## 2022-05-04 ENCOUNTER — Telehealth: Payer: Self-pay | Admitting: Family Medicine

## 2022-05-04 NOTE — Telephone Encounter (Signed)
Copied from [place] [id number]. Topic: Referral - Status >> May 04, 2022  1:10 PM Ja-Kwan M wrote: Reason for CRM: Pt requests referral to dentist that accepts his insurance ([employer] [place])

## 2022-05-07 ENCOUNTER — Other Ambulatory Visit: Payer: Self-pay | Admitting: Family Medicine

## 2022-05-07 DIAGNOSIS — K7682 Hepatic encephalopathy: Secondary | ICD-10-CM

## 2022-05-09 ENCOUNTER — Telehealth: Payer: Self-pay | Admitting: Family Medicine

## 2022-05-09 DIAGNOSIS — M4306 Spondylolysis, lumbar region: Secondary | ICD-10-CM

## 2022-05-09 MED ORDER — LACTULOSE 10 GM/15ML PO SOLN: ORAL | 0 refills | Status: DC

## 2022-05-09 NOTE — Telephone Encounter (Signed)
Will mail dental resources to his address.

## 2022-05-09 NOTE — Telephone Encounter (Signed)
Pt is a second opinion to spine doctor -Pt was told that he could take a injection. But it may paralyze him. Please advise CB- [phone]

## 2022-05-09 NOTE — Addendum Note (Signed)
Addended by: Erie Noe on: 05/09/2022 12:30 PM   Modules accepted: Orders

## 2022-05-09 NOTE — Telephone Encounter (Signed)
Pt is calling to request a referral to a dentist that accepts his insurance. Please advise CB- [phone]

## 2022-05-09 NOTE — Telephone Encounter (Signed)
Requested Prescriptions  Pending Prescriptions Disp Refills  . lactulose (CHRONULAC) 10 GM/15ML solution [Pharmacy Med Name: LACTULOSE 10GM/15ML SOLUTION] 946 mL 0    Sig: TAKE 30 ML BY MOUTH TWICE DAILY     Gastroenterology:  Laxatives - lactulose Passed - 05/07/2022  3:55 AM      Passed - Cl in normal range and within 360 days    Chloride  Date Value Ref Range Status  12/17/2021 101 96 - 112 mEq/L Final         Passed - CO2 in normal range and within 360 days    CO2  Date Value Ref Range Status  12/17/2021 26 19 - 32 mEq/L Final   Bicarbonate  Date Value Ref Range Status  08/14/2018 17.5 (L) 20.0 - 28.0 mmol/L Final         Passed - K in normal range and within 360 days    Potassium  Date Value Ref Range Status  12/17/2021 3.6 3.5 - 5.1 mEq/L Final         Passed - Na in normal range and within 360 days    Sodium  Date Value Ref Range Status  12/17/2021 135 135 - 145 mEq/L Final  08/05/2021 139 134 - 144 mmol/L Final         Passed - Valid encounter within last 12 months    Recent Outpatient Visits          2 months ago Other chronic pain   [place], Deborah B, MD   2 months ago Alcoholic cirrhosis of liver without ascites [facility])   [place], Enobong, MD   6 months ago Thoracolumbar back pain   [place], Enobong, MD   9 months ago Hyponatremia   [place], [place]   10 months ago Cervical stenosis of spine   [place], Dionne Bucy, [place]      Future Appointments            In 2 months Charlott Rakes, MD [place]

## 2022-05-09 NOTE — Addendum Note (Signed)
Addended by: Marijo Conception on: 05/09/2022 12:43 PM   Modules accepted: Orders

## 2022-05-10 NOTE — Telephone Encounter (Signed)
Does patient need appointment to discuss?

## 2022-05-11 ENCOUNTER — Ambulatory Visit (INDEPENDENT_AMBULATORY_CARE_PROVIDER_SITE_OTHER): Payer: Medicaid Other | Admitting: Podiatry

## 2022-05-11 ENCOUNTER — Ambulatory Visit (INDEPENDENT_AMBULATORY_CARE_PROVIDER_SITE_OTHER): Payer: Medicaid Other

## 2022-05-11 DIAGNOSIS — M79672 Pain in left foot: Secondary | ICD-10-CM | POA: Diagnosis not present

## 2022-05-11 DIAGNOSIS — I998 Other disorder of circulatory system: Secondary | ICD-10-CM | POA: Diagnosis not present

## 2022-05-11 DIAGNOSIS — M79673 Pain in unspecified foot: Secondary | ICD-10-CM

## 2022-05-11 DIAGNOSIS — M79671 Pain in right foot: Secondary | ICD-10-CM

## 2022-05-11 NOTE — Progress Notes (Signed)
Chief Complaint  Patient presents with   Foot Pain     bilateral foot pain, burning, swelling, right over left, midfoot on right foot is very painful    HPI: 56 y.o. male presenting today as a new patient for evaluation of bilateral foot pain.  Patient experiences excruciating pain to the dorsum of the bilateral feet right greater than the left.  The pain is exacerbated when he goes to bed at night and his feet are in elevated position.  Denies history of injury.  Has not done anything for treatment.  Patient is not diabetic but he does have PSxHx 1ppd tobacco use x 30+ yrs.   Past Medical History:  Diagnosis Date   Abnormal nuclear cardiac imaging test    Acid reflux    Acute pancreatitis 08/14/2018   Alcohol withdrawal delirium ([place]) 57/32/2025   Alcoholic cardiomyopathy ([place]) 01/02/7061   Alcoholic hepatitis    Alcoholic ketoacidosis 3/76/2831   Ascites 12/13/2019   Aspiration pneumonia ([place]) 08/20/2016   Chest pain of uncertain etiology    Chronic systolic CHF (congestive heart failure) ([place])    Cirrhosis ([place])    Colon cancer ([place])    DCM (dilated cardiomyopathy) ([place])    Drug abuse ([place]) 01/07/2020   Elevated troponin 06/27/2018   ETOH abuse    Gastropathy 08/14/2018   Heme positive stool 11/13/2017   Hepatic steatosis 08/21/2016   High anion gap metabolic acidosis 02/01/7615   History of colon cancer    HTN (hypertension)    Hypertensive urgency 06/27/2018   Hypoglycemia 06/27/2018   Hypokalemia 01/07/2020   Hypomagnesemia    Hypophosphatemia    Lactic acidosis 08/20/2016   Leukocytosis 01/07/2020   Pancreatitis 08/2018   Polyp of ascending colon    Prolonged Q-T interval on ECG    Prolonged QT interval    Protein-calorie malnutrition, severe 05/02/2018   PUD (peptic ulcer disease)    SBP (spontaneous bacterial peritonitis) ([place]) 01/07/2020   Sepsis ([place]) 08/20/2016   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   SVT (supraventricular tachycardia) ([place])    Thrombocytopenia ([place])  08/21/2016    Past Surgical History:  Procedure Laterality Date   BIOPSY  12/14/2019   Procedure: BIOPSY;  Surgeon: Mauri Pole, MD;  Location: [facility];  Service: Endoscopy;;   catherization  2007   COLONOSCOPY WITH PROPOFOL N/A 12/14/2019   Procedure: COLONOSCOPY WITH PROPOFOL;  Surgeon: Mauri Pole, MD;  Location: [facility];  Service: Endoscopy;  Laterality: N/A;   ESOPHAGOGASTRODUODENOSCOPY (EGD) WITH PROPOFOL N/A 12/14/2019   Procedure: ESOPHAGOGASTRODUODENOSCOPY (EGD) WITH PROPOFOL;  Surgeon: Mauri Pole, MD;  Location: [facility];  Service: Endoscopy;  Laterality: N/A;   HERNIA REPAIR  1967   1 x at birth and at 56 years old   [place]  2007   OPEN REDUCTION INTERNAL FIXATION (ORIF) HAND Right 2012   3rd  digit   POLYPECTOMY  12/14/2019   Procedure: POLYPECTOMY;  Surgeon: Mauri Pole, MD;  Location: [facility];  Service: Endoscopy;;    Allergies  Allergen Reactions   Aspirin Other (See Comments)    Acid reflux    Aspirin Other (See Comments)    Caused acid reflux   Penicillins Hives    Has patient had a PCN reaction causing immediate rash, facial/tongue/throat swelling, SOB or lightheadedness with hypotension: yes Has patient had a PCN reaction causing severe rash involving mucus membranes or skin necrosis: no Has patient had a PCN reaction that required hospitalization: no Has patient  had a PCN reaction occurring within the last 10 years: no If all of the above answers are "NO", then may proceed with Cephalosporin use.    Penicillins Hives     Physical Exam: General: The patient is alert and oriented x3 in no acute distress.  Dermatology: Skin is warm, dry and supple bilateral lower extremities. Negative for open lesions or macerations.  Vascular: Skin cool to touch.  Unable to palpate pulses RLE.  Diminished pulses LLE.  Neurological: Light touch and protective threshold grossly intact.  Hypersensitivity  with light touch noted to the feet  Musculoskeletal Exam: No pedal deformities noted  Radiographic Exam:  Normal osseous mineralization. Joint spaces preserved. No fracture/dislocation/boney destruction.    Assessment: 1.  Suspicion for PAD bilateral lower extremities   Plan of Care:  1. Patient evaluated. X-Rays reviewed.  2.  Order placed for ABIs bilateral lower extremities to establish vascular baseline.  If abnormal recommend vascular consult 3.  Will contact patient with results of the ABI studies  4.  Return to clinic as needed      Edrick Kins, DPM [facility]  Dr. Edrick Kins, DPM    [address], [place] [address]                Office [phone]  Fax [phone]

## 2022-05-11 NOTE — Telephone Encounter (Signed)
Call placed to patient and VM was left informing patient to return phone call. 

## 2022-05-11 NOTE — Telephone Encounter (Signed)
Referral has been placed. 

## 2022-05-12 ENCOUNTER — Other Ambulatory Visit: Payer: Self-pay

## 2022-05-12 ENCOUNTER — Ambulatory Visit (HOSPITAL_COMMUNITY)
Admission: RE | Admit: 2022-05-12 | Discharge: 2022-05-12 | Disposition: A | Discharge status: Home / Self Care | Payer: Medicaid Other | Source: Ambulatory Visit | Attending: Podiatry | Admitting: Podiatry

## 2022-05-12 DIAGNOSIS — M79673 Pain in unspecified foot: Secondary | ICD-10-CM | POA: Insufficient documentation

## 2022-05-12 DIAGNOSIS — I998 Other disorder of circulatory system: Secondary | ICD-10-CM | POA: Diagnosis not present

## 2022-05-12 NOTE — Telephone Encounter (Signed)
Patient returning phone call and would like a call back.

## 2022-05-13 NOTE — Telephone Encounter (Signed)
Call placed to patient and VM was left informing patient to return phone call.  Referral has been placed for his second opinion.

## 2022-05-25 ENCOUNTER — Other Ambulatory Visit: Payer: Self-pay | Admitting: Family Medicine

## 2022-05-25 DIAGNOSIS — K7682 Hepatic encephalopathy: Secondary | ICD-10-CM
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
## 2022-05-30 ENCOUNTER — Telehealth: Payer: Self-pay

## 2022-05-30 NOTE — Telephone Encounter (Signed)
Spoke with patient.  Tried to review the results with him but I could not understand him.  He asked his frustration with his overall medical care by all of his physicians.  I really had a hard time understanding what he was saying.  Phone call ended.  I would assume he will contact the office if he wants follow-up.  Thanks, Dr. Amalia Hailey

## 2022-05-30 NOTE — Progress Notes (Unsigned)
Office Visit    Patient Name: Richard [name] Date of Encounter: 05/30/2022  Primary Care Provider:  Charlott Rakes, MD Primary Cardiologist:  Jenkins Rouge, MD Primary Electrophysiologist: None  Chief Complaint    Richard [name] is a 56 y.o. male with PMH of GERD, EtOH abuse with cirrhosis, HTN, PUD, pancreatitis, SVT,: CAD, HFrEF who presents today for follow-up of coronary artery disease.  Past Medical History    Past Medical History:  Diagnosis Date   Abnormal nuclear cardiac imaging test    Acid reflux    Acute pancreatitis 08/14/2018   Alcohol withdrawal delirium ([place]) 27/78/2423   Alcoholic cardiomyopathy ([place]) 02/03/6143   Alcoholic hepatitis    Alcoholic ketoacidosis 12/16/4006   Ascites 12/13/2019   Aspiration pneumonia ([place]) 08/20/2016   Chest pain of uncertain etiology    Chronic systolic CHF (congestive heart failure) (HCC)    Cirrhosis ([place])    Colon cancer ([place])    DCM (dilated cardiomyopathy) ([place])    Drug abuse ([place]) 01/07/2020   Elevated troponin 06/27/2018   ETOH abuse    Gastropathy 08/14/2018   Heme positive stool 11/13/2017   Hepatic steatosis 08/21/2016   High anion gap metabolic acidosis 03/09/6194   History of colon cancer    HTN (hypertension)    Hypertensive urgency 06/27/2018   Hypoglycemia 06/27/2018   Hypokalemia 01/07/2020   Hypomagnesemia    Hypophosphatemia    Lactic acidosis 08/20/2016   Leukocytosis 01/07/2020   Pancreatitis 08/2018   Polyp of ascending colon    Prolonged Q-T interval on ECG    Prolonged QT interval    Protein-calorie malnutrition, severe 05/02/2018   PUD (peptic ulcer disease)    SBP (spontaneous bacterial peritonitis) ([place]) 01/07/2020   Sepsis ([place]) 08/20/2016   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   SVT (supraventricular tachycardia) ([place])    Thrombocytopenia ([place]) 08/21/2016   Past Surgical History:  Procedure Laterality Date   BIOPSY  12/14/2019   Procedure: BIOPSY;  Surgeon: Mauri Pole, MD;   Location: [facility];  Service: Endoscopy;;   catherization  2007   COLONOSCOPY WITH PROPOFOL N/A 12/14/2019   Procedure: COLONOSCOPY WITH PROPOFOL;  Surgeon: Mauri Pole, MD;  Location: [facility];  Service: Endoscopy;  Laterality: N/A;   ESOPHAGOGASTRODUODENOSCOPY (EGD) WITH PROPOFOL N/A 12/14/2019   Procedure: ESOPHAGOGASTRODUODENOSCOPY (EGD) WITH PROPOFOL;  Surgeon: Mauri Pole, MD;  [facility]: [facility];  Service: Endoscopy;  Laterality: N/A;   HERNIA REPAIR  1969   1 x at birth and at 56 years old   [place]  2007   OPEN REDUCTION INTERNAL FIXATION (ORIF) HAND Right 2012   3rd  digit   POLYPECTOMY  12/14/2019   Procedure: POLYPECTOMY;  Surgeon: Mauri Pole, MD;  [facility];  Service: Endoscopy;;    Allergies  Allergies  Allergen Reactions   Aspirin Other (See Comments)    Acid reflux    Aspirin Other (See Comments)    Caused acid reflux   Penicillins Hives    Has patient had a PCN reaction causing immediate rash, facial/tongue/throat swelling, SOB or lightheadedness with hypotension: yes Has patient had a PCN reaction causing severe rash involving mucus membranes or skin necrosis: no Has patient had a PCN reaction that required hospitalization: no Has patient had a PCN reaction occurring within the last 10 years: no If all of the above answers are "NO", then may proceed with Cephalosporin use.    Penicillins Hives    History of Present Illness  Richard [name] is a 56 year old male with the above-mentioned past medical history who presents today for follow-up of coronary artery disease.  [name] was initially seen in 2020 for evaluation of SVT.  He was treated with beta-blockers heart rate elevation was found to be related to DTs Echo showed EF of 30 to 35%, thought to be nonischemic from alcohol abuse, tachycardia as well as poorly controlled hypertension.    He was seen then in 2021 for complaint of  intermittent chest pain.  Cardiac stress test was completed and revealed intermediate risk with no indication for cardiac catheterization.  Repeat 2D echo showed EF of 60-85% with no RWMA.  He was subsequently seen in follow-up with last visit being 08/2020.  He had complaints of back and leg pain and ABIs were ordered that revealed mild left lower extremity arterial disease with normal right ABI.   Since last being seen in the office patient reports***.  Patient denies chest pain, palpitations, dyspnea, PND, orthopnea, nausea, vomiting, dizziness, syncope, edema, weight gain, or early satiety.     ***Notes:  Home Medications    Current Outpatient Medications  Medication Sig Dispense Refill   aspirin 81 MG EC tablet Take 1 tablet (81 mg total) by mouth daily. 90 tablet 3   baclofen (LIORESAL) 10 MG tablet Take 1 tablet (10 mg total) by mouth 3 (three) times daily. 90 each 0   Cholecalciferol (VITAMIN D-3) 125 MCG (5000 UT) TABS Take 2 PO qd x 3 months, then 1 PO qd long-term after that 180 tablet 3   diclofenac Sodium (VOLTAREN) 1 % GEL APPLY 2 G TOPICALLY 4 (FOUR) TIMES DAILY. 100 g 1   DULoxetine (CYMBALTA) 60 MG capsule Take 1 capsule (60 mg total) by mouth daily. 30 capsule 3   ferrous gluconate (FERGON) 324 MG tablet Take 1 tablet (324 mg total) by mouth daily with breakfast. 30 tablet 2   furosemide (LASIX) 20 MG tablet Take 1 tablet (20 mg total) by mouth daily. 90 tablet 0   gabapentin (NEURONTIN) 300 MG capsule Take 2 capsules (600 mg total) by mouth 3 (three) times daily. 180 capsule 2   Hyoscyamine Sulfate SL 0.125 MG SUBL Take 1 tablet ( 0.125 mg)  by mouth 2 (two) times daily. 60 tablet 2   lactulose (CHRONULAC) 10 GM/15ML solution TAKE 30 ML BY MOUTH TWICE DAILY 946 mL 0   magnesium oxide (MAG-OX) 400 (241.3 Mg) MG tablet Take 1 tablet (400 mg total) by mouth daily. 100 tablet 3   midodrine (PROAMATINE) 5 MG tablet Take 1 tablet (5 mg total) by mouth 3 (three) times daily with  meals. 90 tablet 0   pantoprazole (PROTONIX) 40 MG tablet Take 1 tablet (40 mg total) by mouth daily. 90 tablet 0   spironolactone (ALDACTONE) 100 MG tablet Take 1 tablet (100 mg total) by mouth daily. 30 tablet 2   thiamine 100 MG tablet Take 1 tablet (100 mg total) by mouth daily. 30 tablet 1   No current facility-administered medications for this visit.     Review of Systems  Please see the history of present illness.    (+)*** (+)***  All other systems reviewed and are otherwise negative except as noted above.  Physical Exam    Wt Readings from Last 3 Encounters:  01/25/22 117 lb (53.1 kg)  12/17/21 117 lb 9.6 oz (53.3 kg)  11/09/21 115 lb 3.2 oz (52.3 kg)   JK:DTOIZ were no vitals filed for this visit.,There is  no height or weight on file to calculate BMI.  Constitutional:      Appearance: Healthy appearance. Not in distress.  Neck:     Vascular: JVD normal.  Pulmonary:     Effort: Pulmonary effort is normal.     Breath sounds: No wheezing. No rales. Diminished in the bases Cardiovascular:     Normal rate. Regular rhythm. Normal S1. Normal S2.      Murmurs: There is no murmur.  Edema:    Peripheral edema absent.  Abdominal:     Palpations: Abdomen is soft non tender. There is no hepatomegaly.  Skin:    General: Skin is warm and dry.  Neurological:     General: No focal deficit present.     Mental Status: Alert and oriented to person, place and time.     Cranial Nerves: Cranial nerves are intact.  EKG/LABS/Other Studies Reviewed    ECG personally reviewed by me today - ***  Risk Assessment/Calculations:   {Does this patient have ATRIAL FIBRILLATION?[phone]}        Lab Results  Component Value Date   WBC 5.5 12/17/2021   HGB 10.9 (L) 12/17/2021   HCT 32.5 (L) 12/17/2021   MCV 89.4 12/17/2021   PLT 252.0 12/17/2021   Lab Results  Component Value Date   CREATININE 0.61 12/17/2021   BUN 7 12/17/2021   NA 135 12/17/2021   K 3.6 12/17/2021   CL  101 12/17/2021   CO2 26 12/17/2021   Lab Results  Component Value Date   ALT 11 12/17/2021   AST 25 12/17/2021   ALKPHOS 133 (H) 12/17/2021   BILITOT 1.1 12/17/2021   Lab Results  Component Value Date   CHOL 134 01/11/2020   HDL <10 (L) 01/11/2020   LDLCALC NOT CALCULATED 01/11/2020   TRIG 149 01/11/2020   CHOLHDL NOT CALCULATED 01/11/2020    Lab Results  Component Value Date   HGBA1C 4.6 11/06/2020    Assessment & Plan    1.  Chronic HFrEF/NICM  2.  HTN  3.Alcoholic Cirrhosis  4.SVT      Disposition: Follow-up with Jenkins Rouge, MD or APP in *** months {Are you ordering a CV Procedure (e.g. stress test, cath, DCCV, TEE, etc)?   Press F2        :[id number]}   Medication Adjustments/Labs and Tests Ordered: Current medicines are reviewed at length with the patient today.  Concerns regarding medicines are outlined above.   Signed, Mable Fill, Marissa Nestle, NP 05/30/2022, 8:29 PM [place]

## 2022-05-31 ENCOUNTER — Encounter: Payer: Self-pay | Admitting: Nurse Practitioner

## 2022-05-31 ENCOUNTER — Telehealth: Payer: Self-pay | Admitting: *Deleted

## 2022-05-31 ENCOUNTER — Ambulatory Visit: Payer: Medicaid Other | Attending: Nurse Practitioner | Admitting: Nurse Practitioner

## 2022-05-31 VITALS — BP 124/70 | HR 76 | Ht 72.0 in | Wt 119.0 lb

## 2022-05-31 DIAGNOSIS — Z720 Tobacco use: Secondary | ICD-10-CM

## 2022-05-31 DIAGNOSIS — I471 Supraventricular tachycardia: Secondary | ICD-10-CM | POA: Diagnosis not present

## 2022-05-31 DIAGNOSIS — K7030 Alcoholic cirrhosis of liver without ascites: Secondary | ICD-10-CM

## 2022-05-31 DIAGNOSIS — M79673 Pain in unspecified foot: Secondary | ICD-10-CM | POA: Diagnosis not present

## 2022-05-31 DIAGNOSIS — I10 Essential (primary) hypertension: Secondary | ICD-10-CM

## 2022-05-31 DIAGNOSIS — I998 Other disorder of circulatory system: Secondary | ICD-10-CM

## 2022-05-31 DIAGNOSIS — R0989 Other specified symptoms and signs involving the circulatory and respiratory systems: Secondary | ICD-10-CM | POA: Diagnosis not present

## 2022-05-31 NOTE — Telephone Encounter (Signed)
Patient is calling again for xray results, wanted to apologize for his manner w/ physician one day ago.He is asking that the physician call him again to discuss. Explained that an order for an [place] has been placed and someone will contact him for scheduling, once those results come back, physician will contact to discuss. He verbalized understanding but still would like to speak with doctor.

## 2022-05-31 NOTE — Patient Instructions (Signed)
Medication Instructions:  Your physician recommends that you continue on your current medications as directed. Please refer to the Current Medication list given to you today.  *If you need a refill on your cardiac medications before your next appointment, please call your pharmacy*   Lab Work: None ordered  If you have labs (blood work) drawn today and your tests are completely normal, you will receive your results only by: [place] (if you have MyChart) OR A paper copy in the mail If you have any lab test that is abnormal or we need to change your treatment, we will call you to review the results.   Testing/Procedures: Your physician has requested that you have a carotid duplex. This test is an ultrasound of the carotid arteries in your neck. It looks at blood flow through these arteries that supply the brain with blood. Allow one hour for this exam. There are no restrictions or special instructions.    Follow-Up: At [facility], you and your health needs are our priority.  As part of our continuing mission to provide you with exceptional heart care, we have created designated Provider Care Teams.  These Care Teams include your primary Cardiologist (physician) and Advanced Practice Providers (APPs -  Physician Assistants and Nurse Practitioners) who all work together to provide you with the care you need, when you need it.  We recommend signing up for the patient portal called "MyChart".  Sign up information is provided on this After Visit Summary.  MyChart is used to connect with patients for Virtual Visits (Telemedicine).  Patients are able to view lab/test results, encounter notes, upcoming appointments, etc.  Non-urgent messages can be sent to your provider as well.   To learn more about what you can do with MyChart, go to [email or web address].    Your next appointment:   1 year(s)  The format for your next appointment:   In Person  Provider:   Jenkins Rouge,  MD     Other Instructions   Important Information About Sugar

## 2022-06-01 DIAGNOSIS — M5134 Other intervertebral disc degeneration, thoracic region: Secondary | ICD-10-CM | POA: Diagnosis not present

## 2022-06-01 DIAGNOSIS — M47816 Spondylosis without myelopathy or radiculopathy, lumbar region: Secondary | ICD-10-CM | POA: Diagnosis not present

## 2022-06-01 DIAGNOSIS — M549 Dorsalgia, unspecified: Secondary | ICD-10-CM | POA: Diagnosis not present

## 2022-06-01 DIAGNOSIS — M542 Cervicalgia: Secondary | ICD-10-CM | POA: Diagnosis not present

## 2022-06-08 ENCOUNTER — Ambulatory Visit (HOSPITAL_COMMUNITY)
Admission: RE | Admit: 2022-06-08 | Payer: Medicaid Other | Source: Ambulatory Visit | Attending: Nurse Practitioner | Admitting: Nurse Practitioner

## 2022-06-10 ENCOUNTER — Telehealth: Payer: Self-pay | Admitting: *Deleted

## 2022-06-10 ENCOUNTER — Ambulatory Visit (HOSPITAL_COMMUNITY): Admission: RE | Admit: 2022-06-10 | Payer: Medicaid Other | Source: Ambulatory Visit

## 2022-06-10 NOTE — Telephone Encounter (Signed)
Spoke with patient. Reviewed xrays with patient. - Dr. Amalia Hailey

## 2022-06-10 NOTE — Telephone Encounter (Signed)
Patient is calling for results of xrays taken,was never told what they revealed, please advise.

## 2022-06-15 NOTE — Telephone Encounter (Signed)
Already taking care of.  Thanks, Dr. Amalia Hailey

## 2022-06-17 ENCOUNTER — Ambulatory Visit (HOSPITAL_COMMUNITY)
Admission: RE | Admit: 2022-06-17 | Discharge: 2022-06-17 | Disposition: A | Discharge status: Home / Self Care | Payer: Medicaid Other | Source: Ambulatory Visit | Attending: Nurse Practitioner | Admitting: Nurse Practitioner

## 2022-06-17 DIAGNOSIS — R0989 Other specified symptoms and signs involving the circulatory and respiratory systems: Secondary | ICD-10-CM | POA: Diagnosis present

## 2022-06-17 DIAGNOSIS — I471 Supraventricular tachycardia: Secondary | ICD-10-CM | POA: Insufficient documentation

## 2022-06-19 ENCOUNTER — Other Ambulatory Visit: Payer: Self-pay | Admitting: Family Medicine

## 2022-06-19 DIAGNOSIS — K7011 Alcoholic hepatitis with ascites: Secondary | ICD-10-CM

## 2022-06-20 ENCOUNTER — Telehealth: Payer: Self-pay | Admitting: *Deleted

## 2022-06-20 DIAGNOSIS — I779 Disorder of arteries and arterioles, unspecified: Secondary | ICD-10-CM

## 2022-06-20 DIAGNOSIS — F17200 Nicotine dependence, unspecified, uncomplicated: Secondary | ICD-10-CM

## 2022-06-20 DIAGNOSIS — I6523 Occlusion and stenosis of bilateral carotid arteries: Secondary | ICD-10-CM

## 2022-06-20 DIAGNOSIS — Z79899 Other long term (current) drug therapy: Secondary | ICD-10-CM

## 2022-06-20 DIAGNOSIS — R0989 Other specified symptoms and signs involving the circulatory and respiratory systems: Secondary | ICD-10-CM

## 2022-06-20 NOTE — Telephone Encounter (Signed)
-----   Message from Richard [name]., NP sent at 06/20/2022  7:27 AM EDT ----- Please let Mr.Whitehair know that his carotid ultrasounds showed mild stenosis in the right and left indicating early stages of disease.  Plan: Please continue primary prevention with ASA 81 mg and smoking cessation is encouraged to prevent further escalation of plaque buildup. -Please obtain fasting lipids and LFTs -Please let me know if you have any questions.   Ambrose Pancoast, NP

## 2022-06-20 NOTE — Telephone Encounter (Signed)
Requested medication (s) are due for refill today: Yes  Requested medication (s) are on the active medication list: Yes    Last refill:    Future visit scheduled: Yes  Notes to clinic:  Protocol indicates pt. Needs lab work.    Requested Prescriptions  Pending Prescriptions Disp Refills   furosemide (LASIX) 20 MG tablet [Pharmacy Med Name: FUROSEMIDE '20MG'$  TABLETS] 30 tablet     Sig: TAKE 1 TABLET(20 MG) BY MOUTH DAILY     Cardiovascular:  Diuretics - Loop Failed - 06/19/2022  3:58 AM      Failed - K in normal range and within 180 days    Potassium  Date Value Ref Range Status  12/17/2021 3.6 3.5 - 5.1 mEq/L Final         Failed - Ca in normal range and within 180 days    Calcium  Date Value Ref Range Status  12/17/2021 10.3 8.4 - 10.5 mg/dL Final   Calcium, Ion  Date Value Ref Range Status  04/03/2021 1.06 (L) 1.15 - 1.40 mmol/L Final         Failed - Na in normal range and within 180 days    Sodium  Date Value Ref Range Status  12/17/2021 135 135 - 145 mEq/L Final  08/05/2021 139 134 - 144 mmol/L Final         Failed - Cr in normal range and within 180 days    Creat  Date Value Ref Range Status  11/06/2020 0.60 (L) 0.70 - 1.33 mg/dL Final    Comment:    For patients >31 years of age, the reference limit for Creatinine is approximately 13% higher for people identified as African-American. .    Creatinine, Ser  Date Value Ref Range Status  12/17/2021 0.61 0.40 - 1.50 mg/dL Final         Failed - Cl in normal range and within 180 days    Chloride  Date Value Ref Range Status  12/17/2021 101 96 - 112 mEq/L Final         Failed - Mg Level in normal range and within 180 days    Magnesium  Date Value Ref Range Status  10/31/2021 1.9 1.7 - 2.4 mg/dL Final    Comment:    Performed at [facility], [place] [address] [place], [place] [address]         Failed - Valid encounter within last 6 months    Recent Outpatient Visits           4 months ago  Other chronic pain   [place] Ladell Pier, MD   4 months ago Alcoholic cirrhosis of liver without ascites [facility])   [place], Enobong, MD   7 months ago Thoracolumbar back pain   [place], Enobong, MD   10 months ago Hyponatremia   [place], [name] M, [place]   11 months ago Cervical stenosis of spine   [place], Dionne Bucy, [place]       Future Appointments             In 3 weeks Charlott Rakes, MD [place] BP in normal range    BP Readings from Last 1 Encounters:  05/31/22 124/70

## 2022-06-20 NOTE — Telephone Encounter (Signed)
The patient has been notified of the result and verbalized understanding.  All questions (if any) were answered.  Pt aware to continue taking his ASA 81 mg po daily and smoking cessation.   Pt will come in for lab work to check lipids and LFTs on this Friday 06/24/22.  He is aware to come fasting to this lab appt   Pt verbalized understanding and agrees with this plan.

## 2022-06-21 ENCOUNTER — Telehealth: Payer: Self-pay | Admitting: Emergency Medicine

## 2022-06-21 DIAGNOSIS — G8929 Other chronic pain: Secondary | ICD-10-CM

## 2022-06-21 DIAGNOSIS — G621 Alcoholic polyneuropathy: Secondary | ICD-10-CM

## 2022-06-21 DIAGNOSIS — M4306 Spondylolysis, lumbar region: Secondary | ICD-10-CM

## 2022-06-21 NOTE — Telephone Encounter (Signed)
Requesting referral to pain clinic.

## 2022-06-21 NOTE — Telephone Encounter (Signed)
Copied from [place] [id number]. Topic: Referral - Request for Referral >> Jun 21, 2022 11:50 AM Ludger Nutting wrote: Has patient seen PCP for this complaint? Yes.   *If NO, is insurance requiring patient see PCP for this issue before PCP can refer them? Referral for which specialty: Pain Management Clinic Preferred provider/office: Any Reason for referral:Pain Management

## 2022-06-22 DIAGNOSIS — Z79899 Other long term (current) drug therapy: Secondary | ICD-10-CM | POA: Diagnosis not present

## 2022-06-22 DIAGNOSIS — M545 Low back pain, unspecified: Secondary | ICD-10-CM | POA: Diagnosis not present

## 2022-06-22 DIAGNOSIS — M542 Cervicalgia: Secondary | ICD-10-CM | POA: Diagnosis not present

## 2022-06-22 DIAGNOSIS — M25562 Pain in left knee: Secondary | ICD-10-CM | POA: Diagnosis not present

## 2022-06-22 DIAGNOSIS — Z125 Encounter for screening for malignant neoplasm of prostate: Secondary | ICD-10-CM | POA: Diagnosis not present

## 2022-06-22 DIAGNOSIS — R030 Elevated blood-pressure reading, without diagnosis of hypertension: Secondary | ICD-10-CM | POA: Diagnosis not present

## 2022-06-22 DIAGNOSIS — Z681 Body mass index (BMI) 19 or less, adult: Secondary | ICD-10-CM | POA: Diagnosis not present

## 2022-06-22 DIAGNOSIS — M129 Arthropathy, unspecified: Secondary | ICD-10-CM | POA: Diagnosis not present

## 2022-06-22 DIAGNOSIS — Z1159 Encounter for screening for other viral diseases: Secondary | ICD-10-CM | POA: Diagnosis not present

## 2022-06-22 NOTE — Addendum Note (Signed)
Addended by: Charlott Rakes on: 06/22/2022 08:36 AM   Modules accepted: Orders

## 2022-06-22 NOTE — Telephone Encounter (Signed)
Referral has been placed. 

## 2022-06-22 NOTE — Telephone Encounter (Signed)
Noted  

## 2022-06-24 ENCOUNTER — Ambulatory Visit: Payer: Medicaid Other | Attending: Cardiology

## 2022-06-24 DIAGNOSIS — Z79899 Other long term (current) drug therapy: Secondary | ICD-10-CM

## 2022-06-24 DIAGNOSIS — I779 Disorder of arteries and arterioles, unspecified: Secondary | ICD-10-CM

## 2022-06-24 DIAGNOSIS — F17200 Nicotine dependence, unspecified, uncomplicated: Secondary | ICD-10-CM

## 2022-06-24 DIAGNOSIS — R0989 Other specified symptoms and signs involving the circulatory and respiratory systems: Secondary | ICD-10-CM

## 2022-06-24 DIAGNOSIS — I6523 Occlusion and stenosis of bilateral carotid arteries: Secondary | ICD-10-CM

## 2022-06-24 LAB — LIPID PANEL
Chol/HDL Ratio: 2 ratio (ref 0.0–5.0)
Cholesterol, Total: 156 mg/dL (ref 100–199)
HDL: 77 mg/dL (ref >39)
LDL Chol Calc (NIH): 67 mg/dL (ref 0–99)
Triglycerides: 58 mg/dL (ref 0–149)
VLDL Cholesterol Cal: 12 mg/dL (ref 5–40)

## 2022-06-24 LAB — HEPATIC FUNCTION PANEL
ALT: 29 IU/L (ref 0–44)
AST: 114 IU/L — ABNORMAL HIGH (ref 0–40)
Albumin: 4.1 g/dL (ref 3.8–4.9)
Alkaline Phosphatase: 151 IU/L — ABNORMAL HIGH (ref 44–121)
Bilirubin Total: 1.6 mg/dL — ABNORMAL HIGH (ref 0.0–1.2)
Bilirubin, Direct: 1.05 mg/dL — ABNORMAL HIGH (ref 0.00–0.40)
Total Protein: 7.5 g/dL (ref 6.0–8.5)

## 2022-06-28 ENCOUNTER — Telehealth: Payer: Self-pay | Admitting: Nurse Practitioner

## 2022-06-28 DIAGNOSIS — I5023 Acute on chronic systolic (congestive) heart failure: Secondary | ICD-10-CM

## 2022-06-28 DIAGNOSIS — I426 Alcoholic cardiomyopathy: Secondary | ICD-10-CM

## 2022-06-28 NOTE — Telephone Encounter (Signed)
Pt calling back for lab results

## 2022-06-28 NOTE — Telephone Encounter (Signed)
Marylu Lund., NP  06/25/2022  9:10 AM EDT     Please let Mr.Reffner know that his cholesterol numbers are all within normal limits.  Please continue lifestyle modifications and ASA if tolerated.     Please advise him to follow-up with PCP regarding most recent hepatic function panel.  Please let me know if have any additional questions.   Ambrose Pancoast, NP   The patient has been notified of the result and verbalized understanding.  All questions (if any) were answered. Antonieta Iba, RN 06/28/2022 10:08 AM

## 2022-07-06 DIAGNOSIS — Z79899 Other long term (current) drug therapy: Secondary | ICD-10-CM | POA: Diagnosis not present

## 2022-07-06 DIAGNOSIS — E559 Vitamin D deficiency, unspecified: Secondary | ICD-10-CM | POA: Diagnosis not present

## 2022-07-06 DIAGNOSIS — Z125 Encounter for screening for malignant neoplasm of prostate: Secondary | ICD-10-CM | POA: Diagnosis not present

## 2022-07-06 DIAGNOSIS — M129 Arthropathy, unspecified: Secondary | ICD-10-CM | POA: Diagnosis not present

## 2022-07-06 DIAGNOSIS — Z1159 Encounter for screening for other viral diseases: Secondary | ICD-10-CM | POA: Diagnosis not present

## 2022-07-06 DIAGNOSIS — M545 Low back pain, unspecified: Secondary | ICD-10-CM | POA: Diagnosis not present

## 2022-07-12 ENCOUNTER — Ambulatory Visit: Payer: Medicaid Other | Attending: Family Medicine | Admitting: Family Medicine

## 2022-07-17 ENCOUNTER — Other Ambulatory Visit: Payer: Self-pay | Admitting: Family Medicine

## 2022-07-17 DIAGNOSIS — K7682 Hepatic encephalopathy: Secondary | ICD-10-CM

## 2022-07-18 ENCOUNTER — Other Ambulatory Visit: Payer: Self-pay | Admitting: Family Medicine

## 2022-07-18 DIAGNOSIS — K7011 Alcoholic hepatitis with ascites: Secondary | ICD-10-CM

## 2022-07-18 NOTE — Telephone Encounter (Signed)
Requested Prescriptions  Pending Prescriptions Disp Refills  . lactulose (CHRONULAC) 10 GM/15ML solution [Pharmacy Med Name: LACTULOSE 10GM/15ML SOLUTION] 946 mL 0    Sig: TAKE 30 ML BY MOUTH TWICE DAILY     Gastroenterology:  Laxatives - lactulose Passed - 07/17/2022 11:54 AM      Passed - Cl in normal range and within 360 days    Chloride  Date Value Ref Range Status  12/17/2021 101 96 - 112 mEq/L Final         Passed - CO2 in normal range and within 360 days    CO2  Date Value Ref Range Status  12/17/2021 26 19 - 32 mEq/L Final   Bicarbonate  Date Value Ref Range Status  08/14/2018 17.5 (L) 20.0 - 28.0 mmol/L Final         Passed - K in normal range and within 360 days    Potassium  Date Value Ref Range Status  12/17/2021 3.6 3.5 - 5.1 mEq/L Final         Passed - Na in normal range and within 360 days    Sodium  Date Value Ref Range Status  12/17/2021 135 135 - 145 mEq/L Final  08/05/2021 139 134 - 144 mmol/L Final         Passed - Valid encounter within last 12 months    Recent Outpatient Visits          5 months ago Other chronic pain   [place], Deborah B, MD   5 months ago Alcoholic cirrhosis of liver without ascites [facility])   [place], Enobong, MD   8 months ago Thoracolumbar back pain   [place], Enobong, MD   11 months ago Hyponatremia   [place], [place]   1 year ago Cervical stenosis of spine   [place], [place], [place]

## 2022-07-30 ENCOUNTER — Ambulatory Visit: Payer: Medicaid Other | Admitting: Family

## 2022-07-31 ENCOUNTER — Other Ambulatory Visit: Payer: Self-pay | Admitting: Family Medicine

## 2022-07-31 DIAGNOSIS — K7682 Hepatic encephalopathy: Secondary | ICD-10-CM

## 2022-08-05 DIAGNOSIS — H5789 Other specified disorders of eye and adnexa: Secondary | ICD-10-CM | POA: Diagnosis not present

## 2022-08-05 DIAGNOSIS — Z681 Body mass index (BMI) 19 or less, adult: Secondary | ICD-10-CM | POA: Diagnosis not present

## 2022-08-05 DIAGNOSIS — I10 Essential (primary) hypertension: Secondary | ICD-10-CM | POA: Diagnosis not present

## 2022-08-05 DIAGNOSIS — M545 Low back pain, unspecified: Secondary | ICD-10-CM | POA: Diagnosis not present

## 2022-08-05 DIAGNOSIS — Z79899 Other long term (current) drug therapy: Secondary | ICD-10-CM | POA: Diagnosis not present

## 2022-08-05 DIAGNOSIS — M25562 Pain in left knee: Secondary | ICD-10-CM | POA: Diagnosis not present

## 2022-08-08 ENCOUNTER — Other Ambulatory Visit: Payer: Self-pay | Admitting: Family Medicine

## 2022-08-08 DIAGNOSIS — K7011 Alcoholic hepatitis with ascites: Secondary | ICD-10-CM

## 2022-08-09 DIAGNOSIS — Z79899 Other long term (current) drug therapy: Secondary | ICD-10-CM | POA: Diagnosis not present

## 2022-08-09 NOTE — Telephone Encounter (Signed)
Requested Prescriptions  Pending Prescriptions Disp Refills   furosemide (LASIX) 20 MG tablet [Pharmacy Med Name: FUROSEMIDE '20MG'$  TABLETS] 90 tablet 0    Sig: TAKE 1 TABLET(20 MG) BY MOUTH DAILY     Cardiovascular:  Diuretics - Loop Failed - 08/08/2022  3:14 PM      Failed - K in normal range and within 180 days    Potassium  Date Value Ref Range Status  12/17/2021 3.6 3.5 - 5.1 mEq/L Final         Failed - Ca in normal range and within 180 days    Calcium  Date Value Ref Range Status  12/17/2021 10.3 8.4 - 10.5 mg/dL Final   Calcium, Ion  Date Value Ref Range Status  04/03/2021 1.06 (L) 1.15 - 1.40 mmol/L Final         Failed - Na in normal range and within 180 days    Sodium  Date Value Ref Range Status  12/17/2021 135 135 - 145 mEq/L Final  08/05/2021 139 134 - 144 mmol/L Final         Failed - Cr in normal range and within 180 days    Creat  Date Value Ref Range Status  11/06/2020 0.60 (L) 0.70 - 1.33 mg/dL Final    Comment:    For patients >27 years of age, the reference limit for Creatinine is approximately 13% higher for people identified as African-American. .    Creatinine, Ser  Date Value Ref Range Status  12/17/2021 0.61 0.40 - 1.50 mg/dL Final         Failed - Cl in normal range and within 180 days    Chloride  Date Value Ref Range Status  12/17/2021 101 96 - 112 mEq/L Final         Failed - Mg Level in normal range and within 180 days    Magnesium  Date Value Ref Range Status  10/31/2021 1.9 1.7 - 2.4 mg/dL Final    Comment:    Performed at [facility], [place] [address], [place], [place] [address]         Failed - Valid encounter within last 6 months    Recent Outpatient Visits           5 months ago Other chronic pain   [place] Karle Plumber B, MD   6 months ago Alcoholic cirrhosis of liver without ascites [facility])   [place], Enobong, MD   9 months ago  Thoracolumbar back pain   [place] Charlott Rakes, MD   1 year ago Hyponatremia   [place], [place]   1 year ago Cervical stenosis of spine   [place] [facility] [place], Dionne Bucy, [place]       Future Appointments             In 2 months Charlott Rakes, MD [place] BP in normal range    BP Readings from Last 1 Encounters:  05/31/22 124/70          pantoprazole (PROTONIX) 40 MG tablet [Pharmacy Med Name: PANTOPRAZOLE '40MG'$  TABLETS] 90 tablet 0    Sig: TAKE 1 TABLET(40 MG) BY MOUTH DAILY     Gastroenterology: Proton Pump Inhibitors Passed - 08/08/2022  3:14 PM  Passed - Valid encounter within last 12 months    Recent Outpatient Visits           5 months ago Other chronic pain   [place] Karle Plumber B, MD   6 months ago Alcoholic cirrhosis of liver without ascites [facility])   [place], Enobong, MD   9 months ago Thoracolumbar back pain   [place], MD   1 year ago Hyponatremia   [place], [place]   1 year ago Cervical stenosis of spine   [place], [place]       Future Appointments             In 2 months Charlott Rakes, MD [place]

## 2022-08-28 ENCOUNTER — Other Ambulatory Visit: Payer: Self-pay | Admitting: Family Medicine

## 2022-08-28 DIAGNOSIS — K7682 Hepatic encephalopathy: Secondary | ICD-10-CM

## 2022-08-30 NOTE — Telephone Encounter (Signed)
Requested Prescriptions  Pending Prescriptions Disp Refills   lactulose ([place]) 10 GM/15ML solution [Pharmacy Med Name: LACTULOSE 10GM/15ML SOLUTION] 946 mL 1    Sig: TAKE 30 ML BY MOUTH TWICE DAILY     Gastroenterology:  Laxatives - lactulose Passed - 08/28/2022  4:00 AM      Passed - Cl in normal range and within 360 days    Chloride  Date Value Ref Range Status  12/17/2021 101 96 - 112 mEq/L Final         Passed - CO2 in normal range and within 360 days    CO2  Date Value Ref Range Status  12/17/2021 26 19 - 32 mEq/L Final   Bicarbonate  Date Value Ref Range Status  08/14/2018 17.5 (L) 20.0 - 28.0 mmol/L Final         Passed - K in normal range and within 360 days    Potassium  Date Value Ref Range Status  12/17/2021 3.6 3.5 - 5.1 mEq/L Final         Passed - Na in normal range and within 360 days    Sodium  Date Value Ref Range Status  12/17/2021 135 135 - 145 mEq/L Final  08/05/2021 139 134 - 144 mmol/L Final         Passed - Valid encounter within last 12 months    Recent Outpatient Visits           6 months ago Other chronic pain   [place], Deborah B, MD   6 months ago Alcoholic cirrhosis of liver without ascites [facility])   [place], Enobong, MD   9 months ago Thoracolumbar back pain   [place], Enobong, MD   1 year ago Hyponatremia   [place], [place]   1 year ago Cervical stenosis of spine   [place], [place]       Future Appointments             In 1 month Charlott Rakes, MD [place]

## 2022-09-05 DIAGNOSIS — Z79899 Other long term (current) drug therapy: Secondary | ICD-10-CM | POA: Diagnosis not present

## 2022-09-06 DIAGNOSIS — H5213 Myopia, bilateral: Secondary | ICD-10-CM | POA: Diagnosis not present

## 2022-09-12 ENCOUNTER — Other Ambulatory Visit: Payer: Self-pay | Admitting: Family Medicine

## 2022-09-12 DIAGNOSIS — G621 Alcoholic polyneuropathy: Secondary | ICD-10-CM

## 2022-09-12 DIAGNOSIS — F32A Depression, unspecified: Secondary | ICD-10-CM

## 2022-09-19 DIAGNOSIS — Z79899 Other long term (current) drug therapy: Secondary | ICD-10-CM | POA: Diagnosis not present

## 2022-09-20 ENCOUNTER — Other Ambulatory Visit: Payer: Self-pay

## 2022-09-22 DIAGNOSIS — Z79899 Other long term (current) drug therapy: Secondary | ICD-10-CM | POA: Diagnosis not present

## 2022-09-23 ENCOUNTER — Other Ambulatory Visit: Payer: Self-pay

## 2022-09-23 ENCOUNTER — Emergency Department (HOSPITAL_COMMUNITY): Payer: Medicaid Other

## 2022-09-23 ENCOUNTER — Encounter (HOSPITAL_COMMUNITY): Payer: Self-pay

## 2022-09-23 ENCOUNTER — Inpatient Hospital Stay (HOSPITAL_COMMUNITY)
Admission: EM | Admit: 2022-09-23 | Discharge: 2022-09-25 | DRG: 441 | Discharge status: Against Medical Advice | Payer: Medicaid Other | Attending: Internal Medicine | Admitting: Internal Medicine

## 2022-09-23 DIAGNOSIS — E872 Acidosis, unspecified: Secondary | ICD-10-CM | POA: Diagnosis not present

## 2022-09-23 DIAGNOSIS — F17210 Nicotine dependence, cigarettes, uncomplicated: Secondary | ICD-10-CM | POA: Diagnosis present

## 2022-09-23 DIAGNOSIS — R9431 Abnormal electrocardiogram [ECG] [EKG]: Secondary | ICD-10-CM | POA: Diagnosis present

## 2022-09-23 DIAGNOSIS — R64 Cachexia: Secondary | ICD-10-CM | POA: Diagnosis present

## 2022-09-23 DIAGNOSIS — R531 Weakness: Principal | ICD-10-CM

## 2022-09-23 DIAGNOSIS — E861 Hypovolemia: Secondary | ICD-10-CM | POA: Diagnosis present

## 2022-09-23 DIAGNOSIS — E876 Hypokalemia: Secondary | ICD-10-CM | POA: Diagnosis not present

## 2022-09-23 DIAGNOSIS — E43 Unspecified severe protein-calorie malnutrition: Secondary | ICD-10-CM | POA: Diagnosis not present

## 2022-09-23 DIAGNOSIS — Z681 Body mass index (BMI) 19 or less, adult: Secondary | ICD-10-CM

## 2022-09-23 DIAGNOSIS — R791 Abnormal coagulation profile: Secondary | ICD-10-CM | POA: Diagnosis not present

## 2022-09-23 DIAGNOSIS — K219 Gastro-esophageal reflux disease without esophagitis: Secondary | ICD-10-CM | POA: Diagnosis present

## 2022-09-23 DIAGNOSIS — E860 Dehydration: Secondary | ICD-10-CM | POA: Diagnosis present

## 2022-09-23 DIAGNOSIS — Z1152 Encounter for screening for COVID-19: Secondary | ICD-10-CM

## 2022-09-23 DIAGNOSIS — Z8249 Family history of ischemic heart disease and other diseases of the circulatory system: Secondary | ICD-10-CM

## 2022-09-23 DIAGNOSIS — I110 Hypertensive heart disease with heart failure: Secondary | ICD-10-CM | POA: Diagnosis present

## 2022-09-23 DIAGNOSIS — D649 Anemia, unspecified: Secondary | ICD-10-CM | POA: Diagnosis present

## 2022-09-23 DIAGNOSIS — K7682 Hepatic encephalopathy: Secondary | ICD-10-CM | POA: Diagnosis not present

## 2022-09-23 DIAGNOSIS — B179 Acute viral hepatitis, unspecified: Principal | ICD-10-CM | POA: Diagnosis present

## 2022-09-23 DIAGNOSIS — Z85038 Personal history of other malignant neoplasm of large intestine: Secondary | ICD-10-CM

## 2022-09-23 DIAGNOSIS — K7210 Chronic hepatic failure without coma: Secondary | ICD-10-CM | POA: Diagnosis not present

## 2022-09-23 DIAGNOSIS — K7030 Alcoholic cirrhosis of liver without ascites: Secondary | ICD-10-CM | POA: Diagnosis not present

## 2022-09-23 DIAGNOSIS — I5042 Chronic combined systolic (congestive) and diastolic (congestive) heart failure: Secondary | ICD-10-CM | POA: Diagnosis present

## 2022-09-23 DIAGNOSIS — Z8719 Personal history of other diseases of the digestive system: Secondary | ICD-10-CM

## 2022-09-23 DIAGNOSIS — G894 Chronic pain syndrome: Secondary | ICD-10-CM | POA: Diagnosis present

## 2022-09-23 DIAGNOSIS — R188 Other ascites: Secondary | ICD-10-CM | POA: Diagnosis present

## 2022-09-23 DIAGNOSIS — R17 Unspecified jaundice: Secondary | ICD-10-CM | POA: Diagnosis not present

## 2022-09-23 DIAGNOSIS — K279 Peptic ulcer, site unspecified, unspecified as acute or chronic, without hemorrhage or perforation: Secondary | ICD-10-CM | POA: Diagnosis present

## 2022-09-23 DIAGNOSIS — K7031 Alcoholic cirrhosis of liver with ascites: Secondary | ICD-10-CM | POA: Diagnosis not present

## 2022-09-23 DIAGNOSIS — E871 Hypo-osmolality and hyponatremia: Secondary | ICD-10-CM | POA: Diagnosis present

## 2022-09-23 DIAGNOSIS — R627 Adult failure to thrive: Secondary | ICD-10-CM | POA: Diagnosis not present

## 2022-09-23 DIAGNOSIS — Z515 Encounter for palliative care: Secondary | ICD-10-CM | POA: Diagnosis not present

## 2022-09-23 DIAGNOSIS — K7290 Hepatic failure, unspecified without coma: Secondary | ICD-10-CM | POA: Diagnosis present

## 2022-09-23 DIAGNOSIS — Z7189 Other specified counseling: Secondary | ICD-10-CM | POA: Diagnosis not present

## 2022-09-23 DIAGNOSIS — Z9049 Acquired absence of other specified parts of digestive tract: Secondary | ICD-10-CM

## 2022-09-23 DIAGNOSIS — Z880 Allergy status to penicillin: Secondary | ICD-10-CM

## 2022-09-23 DIAGNOSIS — Z8711 Personal history of peptic ulcer disease: Secondary | ICD-10-CM

## 2022-09-23 DIAGNOSIS — K7200 Acute and subacute hepatic failure without coma: Secondary | ICD-10-CM | POA: Diagnosis not present

## 2022-09-23 DIAGNOSIS — Z886 Allergy status to analgesic agent status: Secondary | ICD-10-CM

## 2022-09-23 DIAGNOSIS — R69 Illness, unspecified: Secondary | ICD-10-CM

## 2022-09-23 DIAGNOSIS — R079 Chest pain, unspecified: Secondary | ICD-10-CM | POA: Diagnosis not present

## 2022-09-23 DIAGNOSIS — I2510 Atherosclerotic heart disease of native coronary artery without angina pectoris: Secondary | ICD-10-CM | POA: Diagnosis present

## 2022-09-23 DIAGNOSIS — Z79899 Other long term (current) drug therapy: Secondary | ICD-10-CM

## 2022-09-23 LAB — URINALYSIS, ROUTINE W REFLEX MICROSCOPIC
Bacteria, UA: NONE SEEN
Glucose, UA: NEGATIVE mg/dL
Hgb urine dipstick: NEGATIVE
Ketones, ur: 80 mg/dL — AB
Leukocytes,Ua: NEGATIVE
Nitrite: NEGATIVE
Protein, ur: 30 mg/dL — AB
Specific Gravity, Urine: 1.017 (ref 1.005–1.030)
pH: 5 (ref 5.0–8.0)

## 2022-09-23 LAB — COMPREHENSIVE METABOLIC PANEL
ALT: 61 U/L — ABNORMAL HIGH (ref 0–44)
AST: 180 U/L — ABNORMAL HIGH (ref 15–41)
Albumin: 3 g/dL — ABNORMAL LOW (ref 3.5–5.0)
Alkaline Phosphatase: 107 U/L (ref 38–126)
Anion gap: 26 — ABNORMAL HIGH (ref 5–15)
BUN: 9 mg/dL (ref 6–20)
CO2: 28 mmol/L (ref 22–32)
Calcium: 8 mg/dL — ABNORMAL LOW (ref 8.9–10.3)
Chloride: 71 mmol/L — ABNORMAL LOW (ref 98–111)
Creatinine, Ser: 0.71 mg/dL (ref 0.61–1.24)
GFR, Estimated: >60 mL/min (ref >60)
Glucose, Bld: 89 mg/dL (ref 70–99)
Potassium: 2.6 mmol/L — CL (ref 3.5–5.1)
Sodium: 125 mmol/L — ABNORMAL LOW (ref 135–145)
Total Bilirubin: 13.9 mg/dL — ABNORMAL HIGH (ref 0.3–1.2)
Total Protein: 8.3 g/dL — ABNORMAL HIGH (ref 6.5–8.1)

## 2022-09-23 LAB — CBC WITH DIFFERENTIAL/PLATELET
Abs Immature Granulocytes: 0.05 10*3/uL (ref 0.00–0.07)
Basophils Absolute: 0.1 10*3/uL (ref 0.0–0.1)
Basophils Relative: 1 %
Eosinophils Absolute: 0.1 10*3/uL (ref 0.0–0.5)
Eosinophils Relative: 1 %
HCT: 25.6 % — ABNORMAL LOW (ref 39.0–52.0)
Hemoglobin: 8.9 g/dL — ABNORMAL LOW (ref 13.0–17.0)
Immature Granulocytes: 1 %
Lymphocytes Relative: 10 %
Lymphs Abs: 0.8 10*3/uL (ref 0.7–4.0)
MCH: 28 pg (ref 26.0–34.0)
MCHC: 34.8 g/dL (ref 30.0–36.0)
MCV: 80.5 fL (ref 80.0–100.0)
Monocytes Absolute: 0.9 10*3/uL (ref 0.1–1.0)
Monocytes Relative: 11 %
Neutro Abs: 6.6 10*3/uL (ref 1.7–7.7)
Neutrophils Relative %: 76 %
Platelets: 107 10*3/uL — ABNORMAL LOW (ref 150–400)
RBC: 3.18 MIL/uL — ABNORMAL LOW (ref 4.22–5.81)
RDW: 23.8 % — ABNORMAL HIGH (ref 11.5–15.5)
WBC: 8.6 10*3/uL (ref 4.0–10.5)
nRBC: 0 % (ref 0.0–0.2)

## 2022-09-23 LAB — PHOSPHORUS: Phosphorus: 1.7 mg/dL — ABNORMAL LOW (ref 2.5–4.6)

## 2022-09-23 LAB — RAPID URINE DRUG SCREEN, HOSP PERFORMED
Amphetamines: NOT DETECTED
Barbiturates: NOT DETECTED
Benzodiazepines: NOT DETECTED
Cocaine: NOT DETECTED
Opiates: NOT DETECTED
Tetrahydrocannabinol: POSITIVE — AB

## 2022-09-23 LAB — LIPASE, BLOOD: Lipase: 73 U/L — ABNORMAL HIGH (ref 11–51)

## 2022-09-23 LAB — PROTIME-INR
INR: 2.4 — ABNORMAL HIGH (ref 0.8–1.2)
Prothrombin Time: 26.1 seconds — ABNORMAL HIGH (ref 11.4–15.2)

## 2022-09-23 LAB — I-STAT CHEM 8, ED
BUN: 7 mg/dL (ref 6–20)
Calcium, Ion: 0.81 mmol/L — CL (ref 1.15–1.40)
Chloride: 72 mmol/L — ABNORMAL LOW (ref 98–111)
Creatinine, Ser: 0.8 mg/dL (ref 0.61–1.24)
Glucose, Bld: 90 mg/dL (ref 70–99)
HCT: 33 % — ABNORMAL LOW (ref 39.0–52.0)
Hemoglobin: 11.2 g/dL — ABNORMAL LOW (ref 13.0–17.0)
Potassium: 2.8 mmol/L — ABNORMAL LOW (ref 3.5–5.1)
Sodium: 123 mmol/L — ABNORMAL LOW (ref 135–145)
TCO2: 34 mmol/L — ABNORMAL HIGH (ref 22–32)

## 2022-09-23 LAB — TROPONIN I (HIGH SENSITIVITY): Troponin I (High Sensitivity): 6 ng/L (ref <18)

## 2022-09-23 LAB — MAGNESIUM: Magnesium: 1.4 mg/dL — ABNORMAL LOW (ref 1.7–2.4)

## 2022-09-23 LAB — CBG MONITORING, ED: Glucose-Capillary: 70 mg/dL (ref 70–99)

## 2022-09-23 LAB — ETHANOL: Alcohol, Ethyl (B): <10 mg/dL (ref <10)

## 2022-09-23 LAB — RESP PANEL BY RT-PCR (RSV, FLU A&B, COVID)  RVPGX2
Influenza A by PCR: NEGATIVE
Influenza B by PCR: NEGATIVE
Resp Syncytial Virus by PCR: NEGATIVE
SARS Coronavirus 2 by RT PCR: NEGATIVE

## 2022-09-23 LAB — LACTIC ACID, PLASMA
Lactic Acid, Venous: 3.6 mmol/L (ref 0.5–1.9)
Lactic Acid, Venous: 3.2 mmol/L (ref 0.5–1.9)

## 2022-09-23 MED ORDER — LACTATED RINGERS IV SOLN: INTRAVENOUS | Status: DC

## 2022-09-23 MED ORDER — HYDROMORPHONE HCL 1 MG/ML IJ SOLN
1.0000 mg | INTRAMUSCULAR | Status: DC | PRN
  Administered 2022-09-23 – 2022-09-24 (×2): 1 mg via INTRAVENOUS
  Filled 2022-09-23 (×2): qty 1

## 2022-09-23 MED ORDER — MAGNESIUM SULFATE 2 GM/50ML IV SOLN
2.0000 g | Freq: Once | INTRAVENOUS | Status: AC
  Administered 2022-09-23: 2 g via INTRAVENOUS
  Filled 2022-09-23: qty 50

## 2022-09-23 MED ORDER — HYDROMORPHONE HCL 1 MG/ML IJ SOLN
1.0000 mg | Freq: Once | INTRAMUSCULAR | Status: AC
  Administered 2022-09-23: 1 mg via INTRAVENOUS
  Filled 2022-09-23: qty 1

## 2022-09-23 MED ORDER — POTASSIUM CHLORIDE 10 MEQ/100ML IV SOLN
10.0000 meq | INTRAVENOUS | Status: AC
  Administered 2022-09-23 (×4): 10 meq via INTRAVENOUS
  Filled 2022-09-23 (×4): qty 100

## 2022-09-23 MED ORDER — POTASSIUM CHLORIDE CRYS ER 20 MEQ PO TBCR
40.0000 meq | EXTENDED_RELEASE_TABLET | Freq: Once | ORAL | Status: AC
  Administered 2022-09-23: 40 meq via ORAL
  Filled 2022-09-23: qty 2

## 2022-09-23 MED ORDER — PHYTONADIONE 5 MG PO TABS
5.0000 mg | ORAL_TABLET | Freq: Once | ORAL | Status: AC
  Administered 2022-09-23: 5 mg via ORAL
  Filled 2022-09-23: qty 1

## 2022-09-23 NOTE — ED Triage Notes (Signed)
Pt BIB [facility] from home for failure to thrive, lethargy,loss of appetite-pt states he has not eaten in 7 days. Hx of liver cirrhosis. 123/65 BP 75 HR 99 r/a 100 cbg

## 2022-09-23 NOTE — ED Notes (Addendum)
Unsuccessful IV attempt x 3 

## 2022-09-23 NOTE — ED Notes (Signed)
Chem 8 has resulted RN and MD are notice of the result.

## 2022-09-23 NOTE — ED Provider Notes (Signed)
[facility] DEPT Provider Note   CSN: [id number] Arrival date & time: 09/23/22  1341     History {Add pertinent medical, surgical, social history, OB history to HPI:1} Chief Complaint  Patient presents with   Weakness    Richard [name] is a 56 y.o. male.  HPI Reports has had progressive loss of appetite and weight loss.  He reports he has not failed to eat anything for almost 7 days.  Reports he gets food but then he just does not want it.  He reports he is having a lot of pain on the right side of his body.  He indicates his lower chest wall in the right upper and lateral abdomen.  He does have known history of alcoholic cirrhosis.  Patient reports however he quit drinking quite a while ago and he is not drinking anymore.  He denies he had vomiting.  No fevers.  Reports he feels generally weak and very fatigued.    Home Medications Prior to Admission medications   Medication Sig Start Date End Date Taking? Authorizing Provider  aspirin 81 MG EC tablet Take 1 tablet (81 mg total) by mouth daily. Patient not taking: Reported on 05/31/2022 06/16/20   Fulp, Ander Gaster, MD  baclofen (LIORESAL) 10 MG tablet Take 1 tablet (10 mg total) by mouth 3 (three) times daily. Patient not taking: Reported on 05/31/2022 02/08/22   Charlott Rakes, MD  Cholecalciferol (VITAMIN D-3) 125 MCG (5000 UT) TABS Take 2 PO qd x 3 months, then 1 PO qd long-term after that Patient not taking: Reported on 05/31/2022 11/11/20   Hilts, Legrand Como, MD  DULoxetine (CYMBALTA) 60 MG capsule Take 1 capsule (60 mg total) by mouth daily. Patient not taking: Reported on 05/31/2022 11/09/21 11/09/22  Charlott Rakes, MD  ferrous gluconate (FERGON) 324 MG tablet Take 1 tablet (324 mg total) by mouth daily with breakfast. Patient not taking: Reported on 05/31/2022 11/11/20   Hilts, Legrand Como, MD  furosemide (LASIX) 20 MG tablet TAKE 1 TABLET(20 MG) BY MOUTH DAILY 08/09/22   Charlott Rakes, MD  gabapentin  (NEURONTIN) 300 MG capsule TAKE 2 CAPSULES(600 MG) BY MOUTH THREE TIMES DAILY 09/13/22   Charlott Rakes, MD  Hyoscyamine Sulfate SL 0.125 MG SUBL Take 1 tablet ( 0.125 mg)  by mouth 2 (two) times daily. Patient not taking: Reported on 05/31/2022 12/17/21 12/17/22  Zehr, Janett Billow D, PA-C  lactulose (CHRONULAC) 10 GM/15ML solution TAKE 30 ML BY MOUTH TWICE DAILY 08/30/22   Charlott Rakes, MD  magnesium oxide (MAG-OX) 400 (241.3 Mg) MG tablet Take 1 tablet (400 mg total) by mouth daily. Patient not taking: Reported on 05/31/2022 06/16/20   Fulp, Ander Gaster, MD  midodrine (PROAMATINE) 5 MG tablet Take 1 tablet (5 mg total) by mouth 3 (three) times daily with meals. Patient not taking: Reported on 05/31/2022 11/05/21   Kathie Dike, MD  pantoprazole (PROTONIX) 40 MG tablet TAKE 1 TABLET(40 MG) BY MOUTH DAILY 08/09/22   Charlott Rakes, MD  spironolactone (ALDACTONE) 100 MG tablet Take 1 tablet (100 mg total) by mouth daily. Patient not taking: Reported on 05/31/2022 11/05/21 02/03/22  Kathie Dike, MD  thiamine 100 MG tablet Take 1 tablet (100 mg total) by mouth daily. Patient not taking: Reported on 05/31/2022 11/06/21   Kathie Dike, MD      Allergies    Aspirin, Aspirin, Penicillins, and Penicillins    Review of Systems   Review of Systems  Physical Exam Updated Vital Signs BP 116/61   Pulse 78  Temp 98.4 F (36.9 C)   Resp 15   SpO2 100%  Physical Exam Constitutional:      Comments: Patient is alert with clear mental status.  He does not have respiratory distress.  He is cachectic in appearance.  HENT:     Head: Normocephalic and atraumatic.     Mouth/Throat:     Mouth: Mucous membranes are dry.     Pharynx: Oropharynx is clear.  Eyes:     Extraocular Movements: Extraocular movements intact.  Cardiovascular:     Rate and Rhythm: Normal rate and regular rhythm.  Pulmonary:     Comments: No respiratory distress.  Breath sounds are symmetric but very soft at the bases. Abdominal:      Comments: Abdomen is very mildly distended.  He is very cachectic with lower abdomen slightly more pronounced.  No guarding.  Tender right upper quadrant  Musculoskeletal:     Comments: No peripheral edema.  Feet are warm and dry.  Extremities have significant muscular atrophy and cachexia.  Skin:    General: Skin is warm and dry.  Neurological:     General: No focal deficit present.     Mental Status: He is oriented to person, place, and time.     Coordination: Coordination normal.     Comments: Patient spontaneously repositioning himself in the stretcher.  He can push up on his arms to reposition and spontaneously moves all 4 extremities at will     ED Results / Procedures / Treatments   Labs (all labs ordered are listed, but only abnormal results are displayed) Labs Reviewed  URINALYSIS, ROUTINE W REFLEX MICROSCOPIC - Abnormal; Notable for the following components:      Result Value   Color, Urine AMBER (*)    Bilirubin Urine SMALL (*)    Ketones, ur 80 (*)    Protein, ur 30 (*)    All other components within normal limits  RESP PANEL BY RT-PCR (RSV, FLU A&B, COVID)  [id number]  COMPREHENSIVE METABOLIC PANEL  ETHANOL  LACTIC ACID, PLASMA  LACTIC ACID, PLASMA  LIPASE, BLOOD  CBC WITH DIFFERENTIAL/PLATELET  PROTIME-INR  RAPID URINE DRUG SCREEN, HOSP PERFORMED  MAGNESIUM  PHOSPHORUS  CBG MONITORING, ED  CBG MONITORING, ED  I-STAT CHEM 8, ED  TROPONIN I (HIGH SENSITIVITY)    EKG EKG Interpretation  Date/Time:  Friday September 23 2022 13:54:37 EST Ventricular Rate:  85 PR Interval:  156 QRS Duration: 93 QT Interval:  480 QTC Calculation: 571 R Axis:   71 Text Interpretation: Sinus rhythm Right atrial enlargement Abnormal T, consider ischemia, anterior leads Prolonged QT interval increased anterior t wave inversion. no STEMI Confirmed by Charlesetta Shanks [id number]) on 09/23/2022 5:48:41 PM  Radiology DG Chest 1 View  Result Date: 09/23/2022 CLINICAL DATA:  Chest pain  EXAM: CHEST  1 VIEW COMPARISON:  11/01/2021 FINDINGS: The heart size and mediastinal contours are within normal limits. Both lungs are clear. The visualized skeletal structures are unremarkable. IMPRESSION: No active disease. Electronically Signed   By: Misty Stanley M.D.   On: 09/23/2022 18:21    Procedures Procedures  {Document cardiac monitor, telemetry assessment procedure when appropriate:1}  Medications Ordered in ED Medications  HYDROmorphone (DILAUDID) injection 1 mg (has no administration in time range)  lactated ringers infusion (has no administration in time range)    ED Course/ Medical Decision Making/ A&P  Medical Decision Making Amount and/or Complexity of Data Reviewed Labs: ordered. Radiology: ordered.  Risk Prescription drug management. Decision regarding hospitalization.   Patient's medical history is positive for alcohol cirrhosis.  Patient describes progressive weight loss.  He is cachectic in appearance.  Review of EMR indicates multiple medical comorbidities.  Differential diagnosis for weight loss, abdominal pain and cachexia with anorexia is broad.  Will get screening lab work for electrolyte derangement, CBC, PT/INR and drug screens.  Will proceed with chest x-ray as well.  Patient has right lower chest and upper abdominal pain.  He does have known history of hepatitis.  Will consider CT scan after reassessment of pain.  Last CT abdomen 1\29\2023 illustrated significant worsening of steatosis throughout the liver consistent with hepatitis.  No neoplasm identified at that time,  at that time there was mild diffuse mesenteric edema and mild ascites.  Patient also had cholelithiasis present but no cholecystitis at that time.  Will initiate fluid resuscitation and pain control.  LR and Dilaudid ordered.  23: 08 reviewed with Dr. Marcello Moores [facility] hospitalist for admission.  {Document critical care time when appropriate:1} {Document review of  labs and clinical decision tools ie heart score, Chads2Vasc2 etc:1}  {Document your independent review of radiology images, and any outside records:1} {Document your discussion with family members, caretakers, and with consultants:1} {Document social determinants of health affecting pt's care:1} {Document your decision making why or why not admission, treatments were needed:1} Final Clinical Impression(s) / ED Diagnoses Final diagnoses:  None    Rx / DC Orders ED Discharge Orders     None

## 2022-09-24 ENCOUNTER — Encounter (HOSPITAL_COMMUNITY): Payer: Self-pay | Admitting: Internal Medicine

## 2022-09-24 ENCOUNTER — Other Ambulatory Visit: Payer: Self-pay

## 2022-09-24 DIAGNOSIS — B179 Acute viral hepatitis, unspecified: Secondary | ICD-10-CM

## 2022-09-24 DIAGNOSIS — E876 Hypokalemia: Secondary | ICD-10-CM

## 2022-09-24 DIAGNOSIS — K7200 Acute and subacute hepatic failure without coma: Secondary | ICD-10-CM

## 2022-09-24 DIAGNOSIS — R17 Unspecified jaundice: Secondary | ICD-10-CM

## 2022-09-24 DIAGNOSIS — R791 Abnormal coagulation profile: Secondary | ICD-10-CM

## 2022-09-24 DIAGNOSIS — R627 Adult failure to thrive: Secondary | ICD-10-CM

## 2022-09-24 DIAGNOSIS — K7030 Alcoholic cirrhosis of liver without ascites: Secondary | ICD-10-CM

## 2022-09-24 LAB — COMPREHENSIVE METABOLIC PANEL
ALT: 51 U/L — ABNORMAL HIGH (ref 0–44)
ALT: 50 U/L — ABNORMAL HIGH (ref 0–44)
AST: 135 U/L — ABNORMAL HIGH (ref 15–41)
AST: 130 U/L — ABNORMAL HIGH (ref 15–41)
Albumin: 2.8 g/dL — ABNORMAL LOW (ref 3.5–5.0)
Albumin: 2.6 g/dL — ABNORMAL LOW (ref 3.5–5.0)
Alkaline Phosphatase: 93 U/L (ref 38–126)
Alkaline Phosphatase: 85 U/L (ref 38–126)
Anion gap: 15 (ref 5–15)
Anion gap: 14 (ref 5–15)
BUN: 6 mg/dL (ref 6–20)
BUN: 5 mg/dL — ABNORMAL LOW (ref 6–20)
CO2: 32 mmol/L (ref 22–32)
CO2: 33 mmol/L — ABNORMAL HIGH (ref 22–32)
Calcium: 8.5 mg/dL — ABNORMAL LOW (ref 8.9–10.3)
Calcium: 8.4 mg/dL — ABNORMAL LOW (ref 8.9–10.3)
Chloride: 79 mmol/L — ABNORMAL LOW (ref 98–111)
Chloride: 78 mmol/L — ABNORMAL LOW (ref 98–111)
Creatinine, Ser: 0.52 mg/dL — ABNORMAL LOW (ref 0.61–1.24)
Creatinine, Ser: 0.51 mg/dL — ABNORMAL LOW (ref 0.61–1.24)
GFR, Estimated: >60 mL/min (ref >60)
GFR, Estimated: >60 mL/min (ref >60)
Glucose, Bld: 108 mg/dL — ABNORMAL HIGH (ref 70–99)
Glucose, Bld: 129 mg/dL — ABNORMAL HIGH (ref 70–99)
Potassium: 2.5 mmol/L — CL (ref 3.5–5.1)
Potassium: 2.5 mmol/L — CL (ref 3.5–5.1)
Sodium: 126 mmol/L — ABNORMAL LOW (ref 135–145)
Sodium: 125 mmol/L — ABNORMAL LOW (ref 135–145)
Total Bilirubin: 13.3 mg/dL — ABNORMAL HIGH (ref 0.3–1.2)
Total Bilirubin: 13.1 mg/dL — ABNORMAL HIGH (ref 0.3–1.2)
Total Protein: 7.5 g/dL (ref 6.5–8.1)
Total Protein: 7.1 g/dL (ref 6.5–8.1)

## 2022-09-24 LAB — PHOSPHORUS
Phosphorus: <1 mg/dL — CL (ref 2.5–4.6)
Phosphorus: <1 mg/dL — CL (ref 2.5–4.6)

## 2022-09-24 LAB — CBC WITH DIFFERENTIAL/PLATELET
Abs Immature Granulocytes: 0.05 10*3/uL (ref 0.00–0.07)
Basophils Absolute: 0.1 10*3/uL (ref 0.0–0.1)
Basophils Relative: 1 %
Eosinophils Absolute: 0.2 10*3/uL (ref 0.0–0.5)
Eosinophils Relative: 3 %
HCT: 24.9 % — ABNORMAL LOW (ref 39.0–52.0)
Hemoglobin: 8.7 g/dL — ABNORMAL LOW (ref 13.0–17.0)
Immature Granulocytes: 1 %
Lymphocytes Relative: 12 %
Lymphs Abs: 0.8 10*3/uL (ref 0.7–4.0)
MCH: 27.8 pg (ref 26.0–34.0)
MCHC: 34.9 g/dL (ref 30.0–36.0)
MCV: 79.6 fL — ABNORMAL LOW (ref 80.0–100.0)
Monocytes Absolute: 0.7 10*3/uL (ref 0.1–1.0)
Monocytes Relative: 10 %
Neutro Abs: 4.9 10*3/uL (ref 1.7–7.7)
Neutrophils Relative %: 73 %
Platelets: 92 10*3/uL — ABNORMAL LOW (ref 150–400)
RBC: 3.13 MIL/uL — ABNORMAL LOW (ref 4.22–5.81)
RDW: 24.7 % — ABNORMAL HIGH (ref 11.5–15.5)
WBC: 6.7 10*3/uL (ref 4.0–10.5)
nRBC: 0.3 % — ABNORMAL HIGH (ref 0.0–0.2)

## 2022-09-24 LAB — BASIC METABOLIC PANEL
Anion gap: 13 (ref 5–15)
BUN: 5 mg/dL — ABNORMAL LOW (ref 6–20)
CO2: 32 mmol/L (ref 22–32)
Calcium: 8.6 mg/dL — ABNORMAL LOW (ref 8.9–10.3)
Chloride: 80 mmol/L — ABNORMAL LOW (ref 98–111)
Creatinine, Ser: 0.57 mg/dL — ABNORMAL LOW (ref 0.61–1.24)
GFR, Estimated: >60 mL/min (ref >60)
Glucose, Bld: 122 mg/dL — ABNORMAL HIGH (ref 70–99)
Potassium: 3.5 mmol/L (ref 3.5–5.1)
Sodium: 125 mmol/L — ABNORMAL LOW (ref 135–145)

## 2022-09-24 LAB — LACTIC ACID, PLASMA
Lactic Acid, Venous: 2.9 mmol/L (ref 0.5–1.9)
Lactic Acid, Venous: 3.7 mmol/L (ref 0.5–1.9)

## 2022-09-24 LAB — PROTIME-INR
INR: 1.7 — ABNORMAL HIGH (ref 0.8–1.2)
Prothrombin Time: 19.8 seconds — ABNORMAL HIGH (ref 11.4–15.2)

## 2022-09-24 LAB — AMMONIA: Ammonia: 50 umol/L — ABNORMAL HIGH (ref 9–35)

## 2022-09-24 LAB — TSH: TSH: 2.288 u[IU]/mL (ref 0.350–4.500)

## 2022-09-24 LAB — HEMOGLOBIN AND HEMATOCRIT, BLOOD
HCT: 24.4 % — ABNORMAL LOW (ref 39.0–52.0)
Hemoglobin: 8.3 g/dL — ABNORMAL LOW (ref 13.0–17.0)

## 2022-09-24 LAB — MAGNESIUM
Magnesium: 2.1 mg/dL (ref 1.7–2.4)
Magnesium: 1.9 mg/dL (ref 1.7–2.4)

## 2022-09-24 LAB — TROPONIN I (HIGH SENSITIVITY): Troponin I (High Sensitivity): 6 ng/L (ref <18)

## 2022-09-24 MED ORDER — OXYCODONE-ACETAMINOPHEN 10-325 MG PO TABS: 1.0000 | ORAL_TABLET | Freq: Four times a day (QID) | ORAL | Status: DC | PRN

## 2022-09-24 MED ORDER — SODIUM CHLORIDE 0.9 % IV SOLN: INTRAVENOUS | Status: DC

## 2022-09-24 MED ORDER — LACTATED RINGERS IV SOLN: INTRAVENOUS | Status: DC

## 2022-09-24 MED ORDER — HEPARIN SODIUM (PORCINE) 5000 UNIT/ML IJ SOLN
5000.0000 [IU] | Freq: Three times a day (TID) | INTRAMUSCULAR | Status: DC
  Administered 2022-09-24 – 2022-09-25 (×4): 5000 [IU] via SUBCUTANEOUS
  Filled 2022-09-24 (×4): qty 1

## 2022-09-24 MED ORDER — POTASSIUM CHLORIDE 10 MEQ/100ML IV SOLN
10.0000 meq | INTRAVENOUS | Status: AC
  Administered 2022-09-24 (×6): 10 meq via INTRAVENOUS
  Filled 2022-09-24 (×6): qty 100

## 2022-09-24 MED ORDER — OXYCODONE-ACETAMINOPHEN 5-325 MG PO TABS
1.0000 | ORAL_TABLET | Freq: Four times a day (QID) | ORAL | Status: DC | PRN
  Administered 2022-09-24: 1 via ORAL
  Filled 2022-09-24: qty 1

## 2022-09-24 MED ORDER — FOLIC ACID 1 MG PO TABS
1.0000 mg | ORAL_TABLET | Freq: Every day | ORAL | Status: DC
  Administered 2022-09-24 – 2022-09-25 (×2): 1 mg via ORAL
  Filled 2022-09-24 (×2): qty 1

## 2022-09-24 MED ORDER — ADULT MULTIVITAMIN W/MINERALS CH
1.0000 | ORAL_TABLET | Freq: Every day | ORAL | Status: DC
  Administered 2022-09-24 – 2022-09-25 (×2): 1 via ORAL
  Filled 2022-09-24 (×2): qty 1

## 2022-09-24 MED ORDER — PREDNISOLONE 5 MG PO TABS
40.0000 mg | ORAL_TABLET | Freq: Every day | ORAL | Status: DC
  Administered 2022-09-24 – 2022-09-25 (×2): 40 mg via ORAL
  Filled 2022-09-24 (×2): qty 8

## 2022-09-24 MED ORDER — OXYCODONE HCL 5 MG PO TABS
5.0000 mg | ORAL_TABLET | Freq: Four times a day (QID) | ORAL | Status: DC | PRN
  Administered 2022-09-24: 5 mg via ORAL
  Filled 2022-09-24: qty 1

## 2022-09-24 MED ORDER — ASPIRIN 81 MG PO TBEC
81.0000 mg | DELAYED_RELEASE_TABLET | Freq: Every day | ORAL | Status: DC
  Administered 2022-09-24 – 2022-09-25 (×2): 81 mg via ORAL
  Filled 2022-09-24 (×2): qty 1

## 2022-09-24 MED ORDER — ALBUTEROL SULFATE (2.5 MG/3ML) 0.083% IN NEBU: 2.5000 mg | INHALATION_SOLUTION | RESPIRATORY_TRACT | Status: DC | PRN

## 2022-09-24 MED ORDER — PANTOPRAZOLE SODIUM 40 MG PO TBEC
40.0000 mg | DELAYED_RELEASE_TABLET | Freq: Every day | ORAL | Status: DC
  Administered 2022-09-24 – 2022-09-25 (×2): 40 mg via ORAL
  Filled 2022-09-24 (×2): qty 1

## 2022-09-24 MED ORDER — LACTULOSE 10 GM/15ML PO SOLN
20.0000 g | Freq: Three times a day (TID) | ORAL | Status: DC
  Administered 2022-09-24 – 2022-09-25 (×3): 20 g via ORAL
  Filled 2022-09-24 (×3): qty 30

## 2022-09-24 MED ORDER — THIAMINE MONONITRATE 100 MG PO TABS
100.0000 mg | ORAL_TABLET | Freq: Every day | ORAL | Status: DC
  Administered 2022-09-24 – 2022-09-25 (×2): 100 mg via ORAL
  Filled 2022-09-24 (×2): qty 1

## 2022-09-24 NOTE — H&P (Signed)
History and Physical    Richard [name] PNT:[id number] DOB: [birth year] DOA: 09/23/2022  PCP: Charlott Rakes, MD  Patient coming from: home  I have personally briefly reviewed patient's old medical records in [place]  Chief Complaint: right upper quad abdominal pain ,   HPI: Richard [name] is a 56 y.o. male with medical history significant of  GERD, EtOH abuse with cirrhosis, HTN, PUD, pancreatitis, SVT,: CAD,CHF pef( 60-65%) ,colon ca s/p partial colectomy, who present to ED from house with complaint of weakness, loss of appetite, and abdominal pain with poor po intake  for over 1-2 week. No fever/ chills, no emesis, +nausea,weight loss,  no chest pain , no sob. Patient noted pain was temporally related to eating out at a restaurant , he also states outer people in his party also had stomach complaints. He notes last drink was 2 weeks ago.   ED Course:  Afeb, bp 130/77, hr 70, rr 15  sat 100%  GQQ:PYPPJ rhythm,prolonged qt, 571 , twave inversion ant leads UA:neg +ketones Inr 2.4 Etoh<10 Wbc: 8.6, hgb 8.9, plt 107 UDS + THC Lactic 3.6  ,3.2 NA 125, K 2.6,  cr 0.7 ast 180, alt 61, tbili13.9, ag 26  Lipase 73  Na 123, K 2.8,  cr0.8 cl 72 Ammonia 50 CE6,4 Resp panel neg Mag 1.4,phos 1.7 , mag 2giv, vitK '5mg'$   Cxr Nad    CTAB 1. Similar geographic pattern of steatosis throughout the liver which is enlarged with mild nodularity of the contour compatible with cirrhosis. 2. Partial colectomy. There is mild wall thickening of the transverse colon upstream from the anastomosis. This is nonspecific and is favored secondary to congestive colopathy, less likely infectious/inflammatory colitis. 3. Diffuse mesenteric edema and small volume ascites. 4. Cholelithiasis. Gallbladder wall thickening is favored due to liver pathology though acute cholecystitis is not excluded. 5.  Aortic Atherosclerosis (ICD10-I70.0).Tx KCL 40 meq,12m Iv, dilauid '1mg'$    Review of Systems:  As per HPI otherwise 10 point review of systems negative.   Past Medical History:  Diagnosis Date   Abnormal nuclear cardiac imaging test    Acid reflux    Acute pancreatitis 08/14/2018   Alcohol withdrawal delirium ([place] 109/32/6712  Alcoholic cardiomyopathy ([facility] 44/01/8098  Alcoholic hepatitis    Alcoholic ketoacidosis 28/33/8250  Ascites 12/13/2019   Aspiration pneumonia ([facility] 08/20/2016   Chest pain of uncertain etiology    Chronic systolic CHF (congestive heart failure) ([facility]    Cirrhosis ([facility]    Colon cancer ([facility]    DCM (dilated cardiomyopathy) ([facility]    Drug abuse ([facility] 01/07/2020   Elevated troponin 06/27/2018   ETOH abuse    Gastropathy 08/14/2018   Heme positive stool 11/13/2017   Hepatic steatosis 08/21/2016   High anion gap metabolic acidosis 25/12/9765  History of colon cancer    HTN (hypertension)    Hypertensive urgency 06/27/2018   Hypoglycemia 06/27/2018   Hypokalemia 01/07/2020   Hypomagnesemia    Hypophosphatemia    Lactic acidosis 08/20/2016   Leukocytosis 01/07/2020   Pancreatitis 08/2018   Polyp of ascending colon    Prolonged Q-T interval on ECG    Prolonged QT interval    Protein-calorie malnutrition, severe 05/02/2018   PUD (peptic ulcer disease)    SBP (spontaneous bacterial peritonitis) ([facility] 01/07/2020   Sepsis ([facility] 08/20/2016   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   SVT (supraventricular tachycardia)    Thrombocytopenia ([facility] 08/21/2016    Past Surgical History:  Procedure Laterality Date  BIOPSY  12/14/2019   Procedure: BIOPSY;  Surgeon: Mauri Pole, MD;  [facility]: [facility];  Service: Endoscopy;;   catherization  2007   COLONOSCOPY WITH PROPOFOL N/A 12/14/2019   Procedure: COLONOSCOPY WITH PROPOFOL;  Surgeon: Mauri Pole, MD;  Location: [facility];  Service: Endoscopy;  Laterality: N/A;   ESOPHAGOGASTRODUODENOSCOPY (EGD) WITH PROPOFOL N/A 12/14/2019   Procedure: ESOPHAGOGASTRODUODENOSCOPY (EGD) WITH PROPOFOL;  Surgeon: Mauri Pole, MD;  Location: [facility];  Service: Endoscopy;  Laterality: N/A;   HERNIA REPAIR  1967   1 x at birth and at 57 years old   [place]  2007   OPEN REDUCTION INTERNAL FIXATION (ORIF) HAND Right 2012   3rd  digit   POLYPECTOMY  12/14/2019   Procedure: POLYPECTOMY;  Surgeon: Mauri Pole, MD;  Location: [facility];  Service: Endoscopy;;     reports that he has been smoking cigarettes. He has been smoking an average of 1 pack per day. He has never used smokeless tobacco. He reports that he does not currently use alcohol after a past usage of about 2.0 standard drinks of alcohol per week. He reports current drug use. Drug: Marijuana.  Allergies  Allergen Reactions   Aspirin Other (See Comments)    Acid reflux    Aspirin Other (See Comments)    Caused acid reflux   Penicillins Hives    Has patient had a PCN reaction causing immediate rash, facial/tongue/throat swelling, SOB or lightheadedness with hypotension: yes Has patient had a PCN reaction causing severe rash involving mucus membranes or skin necrosis: no Has patient had a PCN reaction that required hospitalization: no Has patient had a PCN reaction occurring within the last 10 years: no If all of the above answers are "NO", then may proceed with Cephalosporin use.    Penicillins Hives    Family History  Problem Relation Age of Onset   Hypertension Mother    Colon cancer Father    Cancer Sister        type unknown   Kidney disease Sister        dialysis   Colon cancer Cousin        x 2   CAD Neg Hx    Stroke Neg Hx    Diabetes Neg Hx    Stomach cancer Neg Hx    Esophageal cancer Neg Hx    Pancreatic cancer Neg Hx    Colon polyps Neg Hx     Prior to Admission medications   Medication Sig Start Date End Date Taking? Authorizing Provider  furosemide (LASIX) 20 MG tablet TAKE 1 TABLET(20 MG) BY MOUTH DAILY 08/09/22  Yes Newlin, Enobong, MD  gabapentin (NEURONTIN) 300 MG capsule  TAKE 2 CAPSULES(600 MG) BY MOUTH THREE TIMES DAILY 09/13/22  Yes Charlott Rakes, MD  naloxone (NARCAN) nasal spray 4 mg/0.1 mL Place 1 spray into the nose as needed (accidental overdose). 07/06/22  Yes [provider]  oxyCODONE-acetaminophen (PERCOCET) 10-325 MG tablet Take 1 tablet by mouth 4 (four) times daily as needed for pain. 08/26/22  Yes [provider]  pantoprazole (PROTONIX) 40 MG tablet TAKE 1 TABLET(40 MG) BY MOUTH DAILY 08/09/22  Yes Charlott Rakes, MD  aspirin 81 MG EC tablet Take 1 tablet (81 mg total) by mouth daily. Patient not taking: Reported on 05/31/2022 06/16/20   Fulp, Ander Gaster, MD  baclofen (LIORESAL) 10 MG tablet Take 1 tablet (10 mg total) by mouth 3 (three) times daily. Patient not taking: Reported on  05/31/2022 02/08/22   Charlott Rakes, MD  Cholecalciferol (VITAMIN D-3) 125 MCG (5000 UT) TABS Take 2 PO qd x 3 months, then 1 PO qd long-term after that Patient not taking: Reported on 05/31/2022 11/11/20   Hilts, Legrand Como, MD  DULoxetine (CYMBALTA) 60 MG capsule Take 1 capsule (60 mg total) by mouth daily. Patient not taking: Reported on 05/31/2022 11/09/21 11/09/22  Charlott Rakes, MD  ferrous gluconate (FERGON) 324 MG tablet Take 1 tablet (324 mg total) by mouth daily with breakfast. Patient not taking: Reported on 05/31/2022 11/11/20   Hilts, Legrand Como, MD  Hyoscyamine Sulfate SL 0.125 MG SUBL Take 1 tablet ( 0.125 mg)  by mouth 2 (two) times daily. Patient not taking: Reported on 05/31/2022 12/17/21 12/17/22  Zehr, Laban Emperor, PA-C  lactulose (CHRONULAC) 10 GM/15ML solution TAKE 30 ML BY MOUTH TWICE DAILY Patient not taking: Reported on 09/24/2022 08/30/22   Charlott Rakes, MD  magnesium oxide (MAG-OX) 400 (241.3 Mg) MG tablet Take 1 tablet (400 mg total) by mouth daily. Patient not taking: Reported on 05/31/2022 06/16/20   Fulp, Ander Gaster, MD  midodrine (PROAMATINE) 5 MG tablet Take 1 tablet (5 mg total) by mouth 3 (three) times daily with meals. Patient not taking:  Reported on 05/31/2022 11/05/21   Kathie Dike, MD  spironolactone (ALDACTONE) 100 MG tablet Take 1 tablet (100 mg total) by mouth daily. Patient not taking: Reported on 05/31/2022 11/05/21 02/03/22  Kathie Dike, MD  thiamine 100 MG tablet Take 1 tablet (100 mg total) by mouth daily. Patient not taking: Reported on 05/31/2022 11/06/21   Kathie Dike, MD    Physical Exam: Vitals:   09/23/22 2030 09/23/22 2150 09/23/22 2254 09/24/22 0030  BP: 125/71 111/72 (!) 102/55 104/66  Pulse: 85 72 68 77  Resp: '20 18 18 '$ (!) 22  Temp:  98.2 F (36.8 C)  98.7 F (37.1 C)  TempSrc:  Oral    SpO2: 100% 100% 100% 97%    Constitutional: NAD, calm, comfortable/ cachectic  Vitals:   09/23/22 2030 09/23/22 2150 09/23/22 2254 09/24/22 0030  BP: 125/71 111/72 (!) 102/55 104/66  Pulse: 85 72 68 77  Resp: '20 18 18 '$ (!) 22  Temp:  98.2 F (36.8 C)  98.7 F (37.1 C)  TempSrc:  Oral    SpO2: 100% 100% 100% 97%   Eyes: PERRL, lids , sclera icteric ENMT: Mucous membranes are moist. Posterior pharynx clear of any exudate or lesions.Normal dentition.  Neck: normal, supple, no masses, no thyromegaly Respiratory: clear to auscultation bilaterally, no wheezing, no crackles. Normal respiratory effort. No accessory muscle use.  Cardiovascular: Regular rate and rhythm, no murmurs / rubs / gallops. No extremity edema. 2+ pedal pulses. Abdomen: no tenderness, no masses palpated. No hepatosplenomegaly. Bowel sounds positive.  Musculoskeletal: no clubbing / cyanosis. No joint deformity upper and lower extremities. Good ROM, no contractures. Normal muscle tone.  Skin: no rashes, lesions, ulcers. No induration Neurologic: CN 2-12 grossly intact. Sensation intact, Strength 5/5 in all 4.  Psychiatric: Normal judgment and insight. Alert and oriented x 3. Normal mood.    Labs on Admission: I have personally reviewed following labs and imaging studies  CBC: Recent Labs  Lab 09/23/22 1751 09/23/22 1854  WBC 8.6  --    NEUTROABS 6.6  --   HGB 8.9* 11.2*  HCT 25.6* 33.0*  MCV 80.5  --   PLT 107*  --    Basic Metabolic Panel: Recent Labs  Lab 09/23/22 1751 09/23/22 1854  NA 125* 123*  K 2.6* 2.8*  CL 71* 72*  CO2 28  --   GLUCOSE 89 90  BUN 9 7  CREATININE 0.71 0.80  CALCIUM 8.0*  --   MG 1.4*  --   PHOS 1.7*  --    GFR: CrCl cannot be calculated (Unknown ideal weight.). Liver Function Tests: Recent Labs  Lab 09/23/22 1751  AST 180*  ALT 61*  ALKPHOS 107  BILITOT 13.9*  PROT 8.3*  ALBUMIN 3.0*   Recent Labs  Lab 09/23/22 1751  LIPASE 73*   Recent Labs  Lab 09/23/22 2326  AMMONIA 50*   Coagulation Profile: Recent Labs  Lab 09/23/22 1751  INR 2.4*   Cardiac Enzymes: No results for input(s): "CKTOTAL", "CKMB", "CKMBINDEX", "TROPONINI" in the last 168 hours. BNP (last 3 results) No results for input(s): "PROBNP" in the last 8760 hours. HbA1C: No results for input(s): "HGBA1C" in the last 72 hours. CBG: Recent Labs  Lab 09/23/22 1351  GLUCAP 70   Lipid Profile: No results for input(s): "CHOL", "HDL", "LDLCALC", "TRIG", "CHOLHDL", "LDLDIRECT" in the last 72 hours. Thyroid Function Tests: No results for input(s): "TSH", "T4TOTAL", "FREET4", "T3FREE", "THYROIDAB" in the last 72 hours. Anemia Panel: No results for input(s): "VITAMINB12", "FOLATE", "FERRITIN", "TIBC", "IRON", "RETICCTPCT" in the last 72 hours. Urine analysis:    Component Value Date/Time   COLORURINE AMBER (A) 09/23/2022 1421   APPEARANCEUR CLEAR 09/23/2022 1421   LABSPEC 1.017 09/23/2022 1421   PHURINE 5.0 09/23/2022 1421   GLUCOSEU NEGATIVE 09/23/2022 1421   HGBUR NEGATIVE 09/23/2022 1421   BILIRUBINUR SMALL (A) 09/23/2022 1421   KETONESUR 80 (A) 09/23/2022 1421   PROTEINUR 30 (A) 09/23/2022 1421   UROBILINOGEN 0.2 01/13/2009 1011   NITRITE NEGATIVE 09/23/2022 1421   LEUKOCYTESUR NEGATIVE 09/23/2022 1421    Radiological Exams on Admission: CT CHEST ABDOMEN PELVIS WO CONTRAST  Result  Date: 09/23/2022 CLINICAL DATA:  Patient presents for failure to thrive, lethargy, loss of appetite. Query sepsis EXAM: CT CHEST, ABDOMEN AND PELVIS WITHOUT CONTRAST TECHNIQUE: Multidetector CT imaging of the chest, abdomen and pelvis was performed following the standard protocol without IV contrast. RADIATION DOSE REDUCTION: This exam was performed according to the departmental dose-optimization program which includes automated exposure control, adjustment of the mA and/or kV according to patient size and/or use of iterative reconstruction technique. COMPARISON:  CT 10/31/2021 and 08/31/2007 FINDINGS: CT CHEST FINDINGS Cardiovascular: Normal heart size. Coronary artery and aortic atherosclerotic calcification. No pericardial effusion. Mediastinum/Nodes: No enlarged mediastinal, hilar, or axillary lymph nodes. Thyroid gland, trachea, and esophagus demonstrate no significant findings. Lungs/Pleura: Paraseptal emphysema and bullous change in the lung apices. No focal consolidation, pleural effusion, or pneumothorax. Musculoskeletal: No chest wall mass or suspicious bone lesions identified. CT ABDOMEN PELVIS FINDINGS Hepatobiliary: Redemonstrated geographic pattern of steatosis throughout the liver. Hepatomegaly. Nodular contour compatible with cirrhosis. The gallbladder is mildly thick-walled and distended with stone in the gallbladder neck. No definite biliary dilation. Pancreas: Unremarkable. No pancreatic ductal dilatation or surrounding inflammatory changes. Spleen: Normal in size without focal abnormality. Adrenals/Urinary Tract: Adrenal glands are unremarkable. Kidneys are normal, without renal calculi, focal lesion, or hydronephrosis. Bladder is unremarkable. Stomach/Bowel: Postoperative change of partial colectomy with anastomosis in the left lower quadrant. The transverse colon upstream from the anastomosis is patulous and there is mild wall thickening of the colon about the anastomosis. Normal caliber large  and small bowel. Stomach is unremarkable. Vascular/Lymphatic: Aortic atherosclerosis. No enlarged abdominal or pelvic lymph nodes. Reproductive: Unremarkable. Other: Mesenteric edema. Small amount of  perihepatic ascites. No free intraperitoneal air. Musculoskeletal: No acute osseous abnormality. IMPRESSION: 1. Similar geographic pattern of steatosis throughout the liver which is enlarged with mild nodularity of the contour compatible with cirrhosis. 2. Partial colectomy. There is mild wall thickening of the transverse colon upstream from the anastomosis. This is nonspecific and is favored secondary to congestive colopathy, less likely infectious/inflammatory colitis. 3. Diffuse mesenteric edema and small volume ascites. 4. Cholelithiasis. Gallbladder wall thickening is favored due to liver pathology though acute cholecystitis is not excluded. 5.  Aortic Atherosclerosis (ICD10-I70.0). Electronically Signed   By: Placido Sou M.D.   On: 09/23/2022 21:33   DG Chest 1 View  Result Date: 09/23/2022 CLINICAL DATA:  Chest pain EXAM: CHEST  1 VIEW COMPARISON:  11/01/2021 FINDINGS: The heart size and mediastinal contours are within normal limits. Both lungs are clear. The visualized skeletal structures are unremarkable. IMPRESSION: No active disease. Electronically Signed   By: Misty Stanley M.D.   On: 09/23/2022 18:21    EKG: Independently reviewed. See above  Assessment/Plan  Acute progression of liver failure  - inr 2.4 ( 1.4), tibli 13.9 was  91.60  -gi consulted by EDP  -will f/u in am AFP ordered -vit K  given in ed  -Cont home lactulose  - pan culture completed in ed at request of  per gi  -monitor lfts    Failure to thrive  Severe Electrolyte abnormalities in setting of ETOH use  Hypokalemia Hypocalcemia  Hypophosphatemia  Hypokalemia  Hyponatreima -replete electrolytes monitor closely  -gentle ivfs -nutrition consult    Prolonged QT -due to electrolyte abnormalities  -replete  electrolytes  -repeat EkG in am  -monitor on tele   Hyponatremia -in setting of etoh use and cirrhosis  -continue to monitor, check serum osmo, tsh , urine NA  Anemia  -repeat stable back to baseline  - no active bleeding    GERD/PUD Ppi   EtOH abuse with cirrhosis -per patient in remission  -will monitor on ciwa     HTN -soft bp  -hold anti-htn medications    SVT -no active issues   CAD -no active issues   CHF pef Ef-( 60-65%) No acute exacerbation     Colon ca s/p partial colectomy  DVT prophylaxis: heparin Code Status: full  Family Communication: none at bedside Disposition Plan: patient  expected to be admitted greater than 2 midnights  Consults called: GI  Admission status: progressive   Clance Boll MD [facility]  If 7PM-7AM, please contact night-coverage [email or web address] Password [email or web address]  09/24/2022, 1:33 AM

## 2022-09-24 NOTE — Progress Notes (Signed)
Mobility Specialist - Progress Note   09/24/22 1459  Mobility  Activity Ambulated independently in hallway  Level of Assistance Standby assist, set-up cues, supervision of patient - no hands on  Assistive Device None  Distance Ambulated (ft) 250 ft  Range of Motion/Exercises Active  Activity Response Tolerated well  Mobility Referral Yes  $Mobility charge 1 Mobility   Pt was found in bed and agreeable to ambulate. Had some swaying from side to side during session and took x1 standing rest break due to burning on his R toe. At EOS returned to recliner chair with necessities in reach and RN notified.  Ferd Hibbs Mobility Specialist

## 2022-09-24 NOTE — Consult Note (Addendum)
ASSESSMENT AND PLAN;   #1. Acute ETOH hepatitis on chronic liver disease, now with liver failure (worsening jaundice, coagulopathy and thrombocytopenia. MELD 3.0 27, DF: 36.6.  No obvious infections.  Will give trial of prednisolone.  #2.  Worsening ETOH liver cirrhosis.  Very minimal ascites on CT  (not tappable). Gd 1 EV on EGD 01/2022  #3. Mild Hepatic encephalopathy  #4. Wt loss with protein calorie malnutrition.   Plan: -Trial of prednisolone '40mg'$  po QD x 28 days. [place] score in 1 week to assess response. -Low-salt, Nl protein diet. -Correct electrolytes -Resume lactulose -Check AFP. -Continue Protonix, home meds. -Vitamin K to help with coagulopathy d/t malnutrition. -Strictly stop alcohol.  Not a candidate for liver transplant d/t ongoing EtOH use/abuse, multiple comorbidities. -Watch for any complications related to steroids. -Trend CBC, CMP, INR -Expect slow improvement.  Patient with overall poor prognosis.  -FU Dr. Henrene Pastor as outpt.   HPI:    Arjay Jaskiewicz is a 56 y.o. male  With H/O GERD, EtOH abuse with liver cirrhosis, HTN, peptic ulcer disease, pancreatitis, SVT, CAD, CHF(Nl EF), colon CA s/p partial L colectomy, chronic pain syndrome followed by [facility] on narcotics.  Very poor historian  Admitted with generalized weakness, decreased appetite, vague abdominal discomfort with weight loss  Found to be profoundly jaundiced TB 14 (TB 1.6 06/2022) with alb 2.8, AST 135, ALT 51 with electrolyte abnormalities. INR 2.4.  He told me that he has more or less quit drinking alcohol this year.  However, last alcohol use was 2 weeks ago.  Also had alcohol at Thanksgiving- "not much". ETOH level this adm <10.  Continues to smoke marijuana. Last use just prior to admission.  No Tylenol.  No nausea, vomiting, heartburn, regurgitation, odynophagia or dysphagia.  Has occasional diarrhea on lactulose.  No melena or hematochezia. No abdominal pain  currently.   Wt Readings from Last 3 Encounters:  09/24/22 49.9 kg  05/31/22 54 kg  01/25/22 53.1 kg   MELD 3.0: 27 at 09/24/2022  8:21 AM MELD-Na: 29 at 09/24/2022  8:21 AM Calculated from: Serum Creatinine: 0.51 mg/dL (Using min of 1 mg/dL) at 09/24/2022  8:21 AM Serum Sodium: 125 mmol/L at 09/24/2022  8:21 AM Total Bilirubin: 13.1 mg/dL at 09/24/2022  8:21 AM Serum Albumin: 2.6 g/dL at 09/24/2022  8:21 AM INR(ratio): 1.7 at 09/24/2022  8:21 AM Age at listing (hypothetical): 76 years Sex: Male at 09/24/2022  8:21 AM  Previous GI procedures: EGD 01/25/2022 - Grade I esophageal varices without stigmata. - Normal esophagus otherwise. - Mild portal gastropathy. Small hiatal hernia - Normal examined duodenum. - No specimens collected.  Colonoscopy 12/2019 - A few erosions in the sigmoid colon, in the ascending colon and at the ileocecal valve. Biopsied. - One 2 mm polyp in the ascending colon, removed with a cold biopsy forceps. Resected and retrieved. - Patent functional end-to-end colo-colonic anastomosis, characterized by healthy appearing mucosa. - Non-bleeding external and internal hemorrhoids. 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) 01/07/2020   Elevated troponin 06/27/2018   ETOH abuse    Gastropathy 08/14/2018   Heme positive stool 11/13/2017   Hepatic steatosis 08/21/2016   High anion gap metabolic acidosis 10/05/7515   History of colon cancer    HTN (hypertension)     Hypertensive urgency 06/27/2018   Hypoglycemia 06/27/2018   Hypokalemia 01/07/2020   Hypomagnesemia    Hypophosphatemia    Lactic acidosis 08/20/2016   Leukocytosis 01/07/2020   Pancreatitis 08/2018   Polyp of ascending colon    Prolonged Q-T interval on ECG    Prolonged QT interval    Protein-calorie malnutrition, severe 05/02/2018   PUD (peptic ulcer disease)    SBP (spontaneous bacterial peritonitis) ([place]) 01/07/2020   Sepsis ([place]) 08/20/2016   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   SVT (supraventricular tachycardia)    Thrombocytopenia ([place]) 08/21/2016    Past Surgical History:  Procedure Laterality Date   BIOPSY  12/14/2019   Procedure: BIOPSY;  Surgeon: Mauri Pole, MD;  Location: [facility];  Service: Endoscopy;;   catherization  2007   COLONOSCOPY WITH PROPOFOL N/A 12/14/2019   Procedure: COLONOSCOPY WITH PROPOFOL;  Surgeon: Mauri Pole, MD;  Location: [facility];  Service: Endoscopy;  Laterality: N/A;   ESOPHAGOGASTRODUODENOSCOPY (EGD) WITH PROPOFOL N/A 12/14/2019   Procedure: ESOPHAGOGASTRODUODENOSCOPY (EGD) WITH PROPOFOL;  Surgeon: Mauri Pole, MD;  Location: [facility];  Service: Endoscopy;  Laterality: N/A;   HERNIA REPAIR  1969   1 x at birth and at 56 years old   [place]  2007   OPEN REDUCTION INTERNAL FIXATION (ORIF) HAND Right 2012   3rd  digit   POLYPECTOMY  12/14/2019   Procedure: POLYPECTOMY;  Surgeon: Mauri Pole, MD;  Location: [facility];  Service: Endoscopy;;    Family History  Problem Relation Age of Onset   Hypertension Mother    Colon cancer Father    Cancer Sister        type unknown   Kidney disease Sister        dialysis   Colon cancer Cousin        x 2   CAD Neg Hx    Stroke Neg Hx    Diabetes Neg Hx    Stomach cancer Neg Hx    Esophageal cancer Neg Hx    Pancreatic cancer Neg Hx    Colon polyps Neg Hx     Social History   Tobacco Use   Smoking status: Every Day    Packs/day: 1.00     Types: Cigarettes   Smokeless tobacco: Never  Vaping Use   Vaping Use: Never used  Substance Use Topics   Alcohol use: Not Currently    Alcohol/week: 2.0 standard drinks of alcohol    Types: 1 Cans of beer, 1 Shots of liquor per week    Comment: last drink in February   Drug use: Yes    Types: Marijuana    Current Facility-Administered Medications  Medication Dose Route Frequency Provider Last Rate Last Admin   albuterol (PROVENTIL) (2.5 MG/3ML) 0.083% nebulizer solution 2.5 mg  2.5 mg Nebulization Q2H PRN Clance Boll, MD       aspirin EC tablet 81 mg  81 mg Oral Daily Myles Rosenthal A, MD   81 mg at 00/17/49 4496   folic acid (FOLVITE) tablet 1 mg  1 mg Oral Daily Clance Boll, MD  1 mg at 09/24/22 0915   heparin injection 5,000 Units  5,000 Units Subcutaneous Q8H Myles Rosenthal A, MD   5,000 Units at 09/24/22 7169   HYDROmorphone (DILAUDID) injection 1 mg  1 mg Intravenous Q3H PRN Charlesetta Shanks, MD   1 mg at 09/24/22 0004   lactated ringers infusion   Intravenous Continuous Clance Boll, MD 75 mL/hr at 09/24/22 0826 New Bag at 09/24/22 0826   multivitamin with minerals tablet 1 tablet  1 tablet Oral Daily Clance Boll, MD   1 tablet at 09/24/22 0915   oxyCODONE-acetaminophen (PERCOCET/ROXICET) 5-325 MG per tablet 1 tablet  1 tablet Oral QID PRN Clance Boll, MD   1 tablet at 09/24/22 6789   And   oxyCODONE (Oxy IR/ROXICODONE) immediate release tablet 5 mg  5 mg Oral QID PRN Clance Boll, MD   5 mg at 09/24/22 0315   pantoprazole (PROTONIX) EC tablet 40 mg  40 mg Oral Daily Myles Rosenthal A, MD   40 mg at 09/24/22 0915   potassium chloride 10 mEq in 100 mL IVPB  10 mEq Intravenous Q1 Hr x 6 Debbe Odea, MD 100 mL/hr at 09/24/22 0915 10 mEq at 09/24/22 0915   thiamine (VITAMIN B1) tablet 100 mg  100 mg Oral Daily Clance Boll, MD   100 mg at 09/24/22 3810    Allergies  Allergen Reactions   Aspirin Other (See Comments)     Acid reflux    Aspirin Other (See Comments)    Caused acid reflux   Penicillins Hives    Has patient had a PCN reaction causing immediate rash, facial/tongue/throat swelling, SOB or lightheadedness with hypotension: yes Has patient had a PCN reaction causing severe rash involving mucus membranes or skin necrosis: no Has patient had a PCN reaction that required hospitalization: no Has patient had a PCN reaction occurring within the last 10 years: no If all of the above answers are "NO", then may proceed with Cephalosporin use.    Penicillins Hives    Review of Systems:  As above     Physical Exam:    BP 96/61 (BP Location: Right Arm)   Pulse 68   Temp (!) 97 F (36.1 C)   Resp 15   Ht 6' (1.829 m)   Wt 49.9 kg   SpO2 100%   BMI 14.92 kg/m  Wt Readings from Last 3 Encounters:  09/24/22 49.9 kg  05/31/22 54 kg  01/25/22 53.1 kg   Constitutional: Cachectic male in no acute distress. Psychiatric: Normal mood and affect. Behavior is normal. HEENT: Pupils normal.  Conjunctivae are normal. +++ scleral icterus.  Cardiovascular: Normal rate, regular rhythm. No edema Pulmonary/chest: Effort normal and breath sounds normal. No wheezing, rales or rhonchi. Abdominal: Soft, nondistended. Nontender. Bowel sounds active throughout. There are no masses palpable.  Liver palpated 10 cm below costal margin.  No ascites. Rectal: Deferred Neurological: Alert and oriented to person place and time. Skin: Skin is warm and dry. No rashes noted.  Data Reviewed: I have personally reviewed following labs and imaging studies  CBC:    Latest Ref Rng & Units 09/24/2022    8:21 AM 09/24/2022    6:08 AM 09/23/2022    6:54 PM  CBC  WBC 4.0 - 10.5 K/uL  6.7    Hemoglobin 13.0 - 17.0 g/dL 8.3  8.7  11.2   Hematocrit 39.0 - 52.0 % 24.4  24.9  33.0   Platelets 150 - 400 K/uL  92      CMP:    Latest Ref Rng & Units 09/24/2022    6:08 AM 09/23/2022    6:54 PM 09/23/2022    5:51 PM  CMP   Glucose 70 - 99 mg/dL 108  90  89   BUN 6 - 20 mg/dL '6  7  9   '$ Creatinine 0.61 - 1.24 mg/dL 0.52  0.80  0.71   Sodium 135 - 145 mmol/L 126  123  125   Potassium 3.5 - 5.1 mmol/L 2.5  2.8  2.6   Chloride 98 - 111 mmol/L 79  72  71   CO2 22 - 32 mmol/L 32   28   Calcium 8.9 - 10.3 mg/dL 8.5   8.0   Total Protein 6.5 - 8.1 g/dL 7.5   8.3   Total Bilirubin 0.3 - 1.2 mg/dL 13.3   13.9   Alkaline Phos 38 - 126 U/L 93   107   AST 15 - 41 U/L 135   180   ALT 0 - 44 U/L 51   61     GFR: Estimated Creatinine Clearance: 74.5 mL/min (A) (by C-G formula based on SCr of 0.52 mg/dL (L)). Liver Function Tests: Recent Labs  Lab 09/23/22 1751 09/24/22 0608  AST 180* 135*  ALT 61* 51*  ALKPHOS 107 93  BILITOT 13.9* 13.3*  PROT 8.3* 7.5  ALBUMIN 3.0* 2.8*   Recent Labs  Lab 09/23/22 1751  LIPASE 73*   Recent Labs  Lab 09/23/22 2326  AMMONIA 50*   Coagulation Profile: Recent Labs  Lab 09/23/22 1751 09/24/22 0821  INR 2.4* 1.7*   HbA1C: No results for input(s): "HGBA1C" in the last 72 hours. Lipid Profile: No results for input(s): "CHOL", "HDL", "LDLCALC", "TRIG", "CHOLHDL", "LDLDIRECT" in the last 72 hours. Thyroid Function Tests: Recent Labs    09/24/22 0608  TSH 2.288   Anemia Panel: No results for input(s): "VITAMINB12", "FOLATE", "FERRITIN", "TIBC", "IRON", "RETICCTPCT" in the last 72 hours.  Recent Results (from the past 240 hour(s))  Resp panel by RT-PCR (RSV, Flu A&B, Covid) Anterior Nasal Swab     Status: None   Collection Time: 09/23/22  5:51 PM   Specimen: Anterior Nasal Swab  Result Value Ref Range Status   SARS Coronavirus 2 by RT PCR NEGATIVE NEGATIVE Final    Comment: (NOTE) SARS-CoV-2 target nucleic acids are NOT DETECTED.  The SARS-CoV-2 RNA is generally detectable in upper respiratory specimens during the acute phase of infection. The lowest concentration of SARS-CoV-2 viral copies this assay can detect is 138 copies/mL. A negative result does not  preclude SARS-Cov-2 infection and should not be used as the sole basis for treatment or other patient management decisions. A negative result may occur with  improper specimen collection/handling, submission of specimen other than nasopharyngeal swab, presence of viral mutation(s) within the areas targeted by this assay, and inadequate number of viral copies(<138 copies/mL). A negative result must be combined with clinical observations, patient history, and epidemiological information. The expected result is Negative.  Fact Sheet for Patients:  EntrepreneurPulse.com.au  Fact Sheet for Healthcare Providers:  IncredibleEmployment.be  This test is no t yet approved or cleared by the [place] FDA and  has been authorized for detection and/or diagnosis of SARS-CoV-2 by FDA under an Emergency Use Authorization (EUA). This EUA will remain  in effect (meaning this test can be used) for the duration of the COVID-19 declaration under Section 564(b)(1) of the Act, 21 U.S.C.section 360bbb-3(b)(1),  unless the authorization is terminated  or revoked sooner.       Influenza A by PCR NEGATIVE NEGATIVE Final   Influenza B by PCR NEGATIVE NEGATIVE Final    Comment: (NOTE) The Xpert Xpress SARS-CoV-2/FLU/RSV plus assay is intended as an aid in the diagnosis of influenza from Nasopharyngeal swab specimens and should not be used as a sole basis for treatment. Nasal washings and aspirates are unacceptable for Xpert Xpress SARS-CoV-2/FLU/RSV testing.  Fact Sheet for Patients: EntrepreneurPulse.com.au  Fact Sheet for Healthcare Providers: IncredibleEmployment.be  This test is not yet approved or cleared by the [place] FDA and has been authorized for detection and/or diagnosis of SARS-CoV-2 by FDA under an Emergency Use Authorization (EUA). This EUA will remain in effect (meaning this test can be used) for the  duration of the COVID-19 declaration under Section 564(b)(1) of the Act, 21 U.S.C. section 360bbb-3(b)(1), unless the authorization is terminated or revoked.     Resp Syncytial Virus by PCR NEGATIVE NEGATIVE Final    Comment: (NOTE) Fact Sheet for Patients: EntrepreneurPulse.com.au  Fact Sheet for Healthcare Providers: IncredibleEmployment.be  This test is not yet approved or cleared by the [place] FDA and has been authorized for detection and/or diagnosis of SARS-CoV-2 by FDA under an Emergency Use Authorization (EUA). This EUA will remain in effect (meaning this test can be used) for the duration of the COVID-19 declaration under Section 564(b)(1) of the Act, 21 U.S.C. section 360bbb-3(b)(1), unless the authorization is terminated or revoked.  Performed at [facility], [place] [address] [place], [place] [address]   Culture, blood (routine x 2)     Status: None (Preliminary result)   Collection Time: 09/23/22 11:26 PM   Specimen: BLOOD  Result Value Ref Range Status   Specimen Description   Final    BLOOD RIGHT ANTECUBITAL Performed at [facility] [address], [place], [place] [address]    Special Requests   Final    BOTTLES DRAWN AEROBIC AND ANAEROBIC Blood Culture results may not be optimal due to an excessive volume of blood received in culture bottles Performed at [facility] [address] [place], [place] [address]    Culture   Final    NO GROWTH < 12 HOURS Performed at [facility] [address] [place], [place] [address]    Report Status PENDING  Incomplete      Radiology Studies: CT CHEST ABDOMEN PELVIS WO CONTRAST  Result Date: 09/23/2022 CLINICAL DATA:  Patient presents for failure to thrive, lethargy, loss of appetite. Query sepsis EXAM: CT CHEST, ABDOMEN AND PELVIS WITHOUT CONTRAST TECHNIQUE: Multidetector CT imaging of the chest, abdomen and  pelvis was performed following the standard protocol without IV contrast. RADIATION DOSE REDUCTION: This exam was performed according to the departmental dose-optimization program which includes automated exposure control, adjustment of the mA and/or kV according to patient size and/or use of iterative reconstruction technique. COMPARISON:  CT 10/31/2021 and 08/31/2007 FINDINGS: CT CHEST FINDINGS Cardiovascular: Normal heart size. Coronary artery and aortic atherosclerotic calcification. No pericardial effusion. Mediastinum/Nodes: No enlarged mediastinal, hilar, or axillary lymph nodes. Thyroid gland, trachea, and esophagus demonstrate no significant findings. Lungs/Pleura: Paraseptal emphysema and bullous change in the lung apices. No focal consolidation, pleural effusion, or pneumothorax. Musculoskeletal: No chest wall mass or suspicious bone lesions identified. CT ABDOMEN PELVIS FINDINGS Hepatobiliary: Redemonstrated geographic pattern of steatosis throughout the liver. Hepatomegaly. Nodular contour compatible with cirrhosis. The gallbladder is mildly thick-walled and distended  with stone in the gallbladder neck. No definite biliary dilation. Pancreas: Unremarkable. No pancreatic ductal dilatation or surrounding inflammatory changes. Spleen: Normal in size without focal abnormality. Adrenals/Urinary Tract: Adrenal glands are unremarkable. Kidneys are normal, without renal calculi, focal lesion, or hydronephrosis. Bladder is unremarkable. Stomach/Bowel: Postoperative change of partial colectomy with anastomosis in the left lower quadrant. The transverse colon upstream from the anastomosis is patulous and there is mild wall thickening of the colon about the anastomosis. Normal caliber large and small bowel. Stomach is unremarkable. Vascular/Lymphatic: Aortic atherosclerosis. No enlarged abdominal or pelvic lymph nodes. Reproductive: Unremarkable. Other: Mesenteric edema. Small amount of perihepatic ascites. No free  intraperitoneal air. Musculoskeletal: No acute osseous abnormality. IMPRESSION: 1. Similar geographic pattern of steatosis throughout the liver which is enlarged with mild nodularity of the contour compatible with cirrhosis. 2. Partial colectomy. There is mild wall thickening of the transverse colon upstream from the anastomosis. This is nonspecific and is favored secondary to congestive colopathy, less likely infectious/inflammatory colitis. 3. Diffuse mesenteric edema and small volume ascites. 4. Cholelithiasis. Gallbladder wall thickening is favored due to liver pathology though acute cholecystitis is not excluded. 5.  Aortic Atherosclerosis (ICD10-I70.0). Electronically Signed   By: Placido Sou M.D.   On: 09/23/2022 21:33   DG Chest 1 View  Result Date: 09/23/2022 CLINICAL DATA:  Chest pain EXAM: CHEST  1 VIEW COMPARISON:  11/01/2021 FINDINGS: The heart size and mediastinal contours are within normal limits. Both lungs are clear. The visualized skeletal structures are unremarkable. IMPRESSION: No active disease. Electronically Signed   By: Misty Stanley M.D.   On: 09/23/2022 18:21      Carmell Austria, MD 09/24/2022, 10:38 AM  Cc: No ref. provider found

## 2022-09-24 NOTE — Progress Notes (Signed)
[facility] Progress Note  Patient: Richard [name]     UXL:[id number]  DOA: 09/23/2022   PCP: Charlott Rakes, MD       Brief hospital course: This is a 56 year old male with alcoholic cirrhosis, alcoholic cardiomyopathy, hepatic encephalopathy, colon cancer status post left hemicolectomy and 2007 peptic ulcer disease, SVT and prolonged QT who presents to the hospital for loss of appetite and severe weakness. In the ED he was noted to have numerous abnormal labs including: Sodium 125 Potassium 2.6 Phosphorus 1.7 Magnesium 1.4 Lipase 73 AST 180 ALT 61 Total bilirubin 13.9 Hemoglobin 8.9 Platelet 107 INR 2.4 Urine drug screen positive for THC CT scan of the chest abdomen pelvis reveals hepatic steatosis with cirrhosis, mesenteric edema with ascites, cholelithiasis and a prior colectomy.  Subjective:  He feels weak. No appetite.   Assessment and Plan: Principal Problem:   Acute hepatitis with increased INR - he told me that his last drink was when he decided to have a few shots on Thanksgiving - appreciate GI eval- agree with Prednisolone - if improvement is not seen, he will likely need to transition to hospice - I will consult palliative care today  Active Problems: Severe hyponatremia due to hypovolemia Lactic acidosis - Lactic acid 3.7 - Na 125- diuretics on hold and receiving LR- cont to follow    Protein-calorie malnutrition, severe- cachexia    Hypomagnesemia   Hypophosphatemia   Hypokalemia - nutritional deficiency in setting of diuretic use - will request nutritionist evaluation - cont to replace and follow - follow on telemetry as well    Ascites - relatively controlled- abdomen is not distended - Lasix and Aldactone are currently on hold and he is receiving IVF due to poor oral intake  Prolonged Q-T interval on ECG - avoid QT prolonging meds     Code Status: Full Code Consultants: GI Level of Care: Level of care: Progressive Total time  on patient care: 40 min DVT prophylaxis:  heparin injection 5,000 Units Start: 09/24/22 0600     Objective:   Vitals:   09/24/22 0248 09/24/22 0248 09/24/22 0610 09/24/22 0814  BP:  (!) 98/59 104/70 96/61  Pulse:  63 83 68  Resp:  20  15  Temp:  (!) 97.5 F (36.4 C) (!) 97.4 F (36.3 C) (!) 97 F (36.1 C)  TempSrc:  Oral Oral   SpO2:  100% 100% 100%  Weight: 49.9 kg     Height: 6' (1.829 m)      Filed Weights   09/24/22 0248  Weight: 49.9 kg   Exam: General exam: Appears comfortable - cachetic, weak appearing  HEENT: oral mucosa moist Respiratory system: Clear to auscultation.  Cardiovascular system: S1 & S2 heard  Gastrointestinal system: Abdomen soft, non-tender, nondistended. Normal bowel sounds   Extremities: No cyanosis, clubbing or edema Psychiatry:  Mood & affect appropriate.      CBC: Recent Labs  Lab 09/23/22 1751 09/23/22 1854 09/24/22 0608 09/24/22 0821  WBC 8.6  --  6.7  --   NEUTROABS 6.6  --  4.9  --   HGB 8.9* 11.2* 8.7* 8.3*  HCT 25.6* 33.0* 24.9* 24.4*  MCV 80.5  --  79.6*  --   PLT 107*  --  92*  --    Basic Metabolic Panel: Recent Labs  Lab 09/23/22 1751 09/23/22 1854 09/24/22 0608 09/24/22 0821  NA 125* 123* 126* 125*  K 2.6* 2.8* 2.5* 2.5*  CL 71* 72* 79* 78*  CO2 28  --  32 33*  GLUCOSE 89 90 108* 129*  BUN '9 7 6 '$ 5*  CREATININE 0.71 0.80 0.52* 0.51*  CALCIUM 8.0*  --  8.5* 8.4*  MG 1.4*  --  2.1 1.9  PHOS 1.7*  --  <1.0* <1.0*   GFR: Estimated Creatinine Clearance: 74.5 mL/min (A) (by C-G formula based on SCr of 0.51 mg/dL (L)).  Scheduled Meds:  aspirin EC  81 mg Oral Daily   folic acid  1 mg Oral Daily   heparin  5,000 Units Subcutaneous Q8H   multivitamin with minerals  1 tablet Oral Daily   pantoprazole  40 mg Oral Daily   prednisoLONE  40 mg Oral Daily   thiamine  100 mg Oral Daily   Continuous Infusions:  lactated ringers 75 mL/hr at 09/24/22 0826   potassium chloride 10 mEq (09/24/22 1218)   Imaging and  lab data was personally reviewed CT CHEST ABDOMEN PELVIS WO CONTRAST  Result Date: 09/23/2022 CLINICAL DATA:  Patient presents for failure to thrive, lethargy, loss of appetite. Query sepsis EXAM: CT CHEST, ABDOMEN AND PELVIS WITHOUT CONTRAST TECHNIQUE: Multidetector CT imaging of the chest, abdomen and pelvis was performed following the standard protocol without IV contrast. RADIATION DOSE REDUCTION: This exam was performed according to the departmental dose-optimization program which includes automated exposure control, adjustment of the mA and/or kV according to patient size and/or use of iterative reconstruction technique. COMPARISON:  CT 10/31/2021 and 08/31/2007 FINDINGS: CT CHEST FINDINGS Cardiovascular: Normal heart size. Coronary artery and aortic atherosclerotic calcification. No pericardial effusion. Mediastinum/Nodes: No enlarged mediastinal, hilar, or axillary lymph nodes. Thyroid gland, trachea, and esophagus demonstrate no significant findings. Lungs/Pleura: Paraseptal emphysema and bullous change in the lung apices. No focal consolidation, pleural effusion, or pneumothorax. Musculoskeletal: No chest wall mass or suspicious bone lesions identified. CT ABDOMEN PELVIS FINDINGS Hepatobiliary: Redemonstrated geographic pattern of steatosis throughout the liver. Hepatomegaly. Nodular contour compatible with cirrhosis. The gallbladder is mildly thick-walled and distended with stone in the gallbladder neck. No definite biliary dilation. Pancreas: Unremarkable. No pancreatic ductal dilatation or surrounding inflammatory changes. Spleen: Normal in size without focal abnormality. Adrenals/Urinary Tract: Adrenal glands are unremarkable. Kidneys are normal, without renal calculi, focal lesion, or hydronephrosis. Bladder is unremarkable. Stomach/Bowel: Postoperative change of partial colectomy with anastomosis in the left lower quadrant. The transverse colon upstream from the anastomosis is patulous and there  is mild wall thickening of the colon about the anastomosis. Normal caliber large and small bowel. Stomach is unremarkable. Vascular/Lymphatic: Aortic atherosclerosis. No enlarged abdominal or pelvic lymph nodes. Reproductive: Unremarkable. Other: Mesenteric edema. Small amount of perihepatic ascites. No free intraperitoneal air. Musculoskeletal: No acute osseous abnormality. IMPRESSION: 1. Similar geographic pattern of steatosis throughout the liver which is enlarged with mild nodularity of the contour compatible with cirrhosis. 2. Partial colectomy. There is mild wall thickening of the transverse colon upstream from the anastomosis. This is nonspecific and is favored secondary to congestive colopathy, less likely infectious/inflammatory colitis. 3. Diffuse mesenteric edema and small volume ascites. 4. Cholelithiasis. Gallbladder wall thickening is favored due to liver pathology though acute cholecystitis is not excluded. 5.  Aortic Atherosclerosis (ICD10-I70.0). Electronically Signed   By: Placido Sou M.D.   On: 09/23/2022 21:33   DG Chest 1 View  Result Date: 09/23/2022 CLINICAL DATA:  Chest pain EXAM: CHEST  1 VIEW COMPARISON:  11/01/2021 FINDINGS: The heart size and mediastinal contours are within normal limits. Both lungs are clear. The visualized skeletal structures are unremarkable. IMPRESSION: No active disease. Electronically Signed  By: Misty Stanley M.D.   On: 09/23/2022 18:21    LOS: 1 day   Author: Debbe Odea  09/24/2022 12:36 PM  To contact [facility]>   Check the care team in [facility] and look for the attending/consulting [place] provider listed  Log into [email or web address] and use [place]'s universal password   Go to> "[facility]"  and find provider  If you still have difficulty reaching the provider, please page the [facility] (Director on Call) for the Hospitalists listed on amion

## 2022-09-25 DIAGNOSIS — E876 Hypokalemia: Secondary | ICD-10-CM | POA: Diagnosis not present

## 2022-09-25 DIAGNOSIS — E871 Hypo-osmolality and hyponatremia: Secondary | ICD-10-CM

## 2022-09-25 DIAGNOSIS — Z515 Encounter for palliative care: Secondary | ICD-10-CM

## 2022-09-25 DIAGNOSIS — K7210 Chronic hepatic failure without coma: Secondary | ICD-10-CM | POA: Diagnosis not present

## 2022-09-25 DIAGNOSIS — Z7189 Other specified counseling: Secondary | ICD-10-CM

## 2022-09-25 DIAGNOSIS — B179 Acute viral hepatitis, unspecified: Secondary | ICD-10-CM | POA: Diagnosis not present

## 2022-09-25 LAB — CBC
HCT: 23.3 % — ABNORMAL LOW (ref 39.0–52.0)
Hemoglobin: 7.9 g/dL — ABNORMAL LOW (ref 13.0–17.0)
MCH: 27.1 pg (ref 26.0–34.0)
MCHC: 33.9 g/dL (ref 30.0–36.0)
MCV: 80.1 fL (ref 80.0–100.0)
Platelets: 105 10*3/uL — ABNORMAL LOW (ref 150–400)
RBC: 2.91 MIL/uL — ABNORMAL LOW (ref 4.22–5.81)
RDW: 25.2 % — ABNORMAL HIGH (ref 11.5–15.5)
WBC: 7.4 10*3/uL (ref 4.0–10.5)
nRBC: 0.3 % — ABNORMAL HIGH (ref 0.0–0.2)

## 2022-09-25 LAB — COMPREHENSIVE METABOLIC PANEL
ALT: 53 U/L — ABNORMAL HIGH (ref 0–44)
AST: 137 U/L — ABNORMAL HIGH (ref 15–41)
Albumin: 2.6 g/dL — ABNORMAL LOW (ref 3.5–5.0)
Alkaline Phosphatase: 100 U/L (ref 38–126)
Anion gap: 13 (ref 5–15)
BUN: <5 mg/dL — ABNORMAL LOW (ref 6–20)
CO2: 26 mmol/L (ref 22–32)
Calcium: 8.9 mg/dL (ref 8.9–10.3)
Chloride: 87 mmol/L — ABNORMAL LOW (ref 98–111)
Creatinine, Ser: 0.46 mg/dL — ABNORMAL LOW (ref 0.61–1.24)
GFR, Estimated: >60 mL/min (ref >60)
Glucose, Bld: 91 mg/dL (ref 70–99)
Potassium: 3 mmol/L — ABNORMAL LOW (ref 3.5–5.1)
Sodium: 126 mmol/L — ABNORMAL LOW (ref 135–145)
Total Bilirubin: 12.9 mg/dL — ABNORMAL HIGH (ref 0.3–1.2)
Total Protein: 7.4 g/dL (ref 6.5–8.1)

## 2022-09-25 LAB — URINE CULTURE: Culture: <10000 — AB

## 2022-09-25 LAB — PHOSPHORUS: Phosphorus: <1 mg/dL — CL (ref 2.5–4.6)

## 2022-09-25 LAB — PROTIME-INR
INR: 1.6 — ABNORMAL HIGH (ref 0.8–1.2)
Prothrombin Time: 19 seconds — ABNORMAL HIGH (ref 11.4–15.2)

## 2022-09-25 MED ORDER — ACETAMINOPHEN 325 MG PO TABS: 650.0000 mg | ORAL_TABLET | Freq: Four times a day (QID) | ORAL | Status: DC | PRN

## 2022-09-25 MED ORDER — LORAZEPAM 1 MG PO TABS: 1.0000 mg | ORAL_TABLET | ORAL | Status: DC | PRN

## 2022-09-25 MED ORDER — LORAZEPAM 2 MG/ML IJ SOLN
1.0000 mg | INTRAMUSCULAR | Status: DC | PRN
  Administered 2022-09-25: 1 mg via INTRAVENOUS
  Filled 2022-09-25: qty 1

## 2022-09-25 MED ORDER — ENSURE ENLIVE PO LIQD: 237.0000 mL | Freq: Three times a day (TID) | ORAL | Status: DC

## 2022-09-25 MED ORDER — PROSOURCE PLUS PO LIQD: 30.0000 mL | Freq: Two times a day (BID) | ORAL | Status: DC

## 2022-09-25 MED ORDER — THIAMINE HCL 100 MG/ML IJ SOLN: 100.0000 mg | Freq: Every day | INTRAMUSCULAR | Status: DC

## 2022-09-25 MED ORDER — POTASSIUM CHLORIDE CRYS ER 20 MEQ PO TBCR
40.0000 meq | EXTENDED_RELEASE_TABLET | ORAL | Status: DC
  Administered 2022-09-25: 40 meq via ORAL
  Filled 2022-09-25: qty 2

## 2022-09-25 MED ORDER — THIAMINE MONONITRATE 100 MG PO TABS: 100.0000 mg | ORAL_TABLET | Freq: Every day | ORAL | Status: DC

## 2022-09-25 MED ORDER — LACTULOSE 10 GM/15ML PO SOLN: 10.0000 g | Freq: Three times a day (TID) | ORAL | Status: DC

## 2022-09-25 MED ORDER — ADULT MULTIVITAMIN W/MINERALS CH: 1.0000 | ORAL_TABLET | Freq: Every day | ORAL | Status: DC

## 2022-09-25 MED ORDER — FOLIC ACID 1 MG PO TABS: 1.0000 mg | ORAL_TABLET | Freq: Every day | ORAL | Status: DC

## 2022-09-25 NOTE — TOC Progression Note (Signed)
Transition of Care [facility]) - Progression Note    Patient Details  Name: Richard [name] MRN: [id number] Date of Birth: Feb 12, 1966  Transition of [facility]) CM/SW Contact  Henrietta Dine, RN Phone Number: 09/25/2022, 5:11 PM  Clinical Narrative:    [facility] consult for SA counseling/education; pt d/c'd before assessment could be completed.       Expected Discharge Plan and Services                                               Social Determinants of Health (SDOH) Interventions SDOH Screenings   Food Insecurity: Food Insecurity Present (09/24/2022)  Housing: Low Risk  (09/24/2022)  Transportation Needs: No Transportation Needs (09/24/2022)  Utilities: Not At Risk (09/24/2022)  Alcohol Screen: Low Risk  (09/22/2021)  Depression (PHQ2-9): High Risk (11/09/2021)  Financial Resource Strain: High Risk (06/03/2020)  Tobacco Use: High Risk (09/24/2022)    Readmission Risk Interventions    11/05/2021   12:56 PM  Readmission Risk Prevention Plan  Transportation Screening Complete  PCP or Specialist Appt within 5-7 Days Complete  Home Care Screening Complete  Medication Review (RN CM) Complete

## 2022-09-25 NOTE — Discharge Summary (Signed)
Physician Discharge Summary  Richard [name] NTI:[id number] DOB: 04/18/1966 DOA: 09/23/2022  PCP: Charlott Rakes, MD  Admit date: 09/23/2022 Discharge date: 09/25/2022 Discharging to: home  He is leaving the hospital AMA.   Recommendations for Outpatient Follow-up:  Continue palliative care discussions as outpt   Consults:  Palliative care GI Procedures:  none   Discharge Diagnoses:   Principal Problem:   Acute hepatitis Active Problems:   Protein-calorie malnutrition, severe   Hypomagnesemia   Hypophosphatemia   Ascites   Prolonged Q-T interval on ECG     Hospital Course:  This is a 56 year old male with alcoholic cirrhosis, alcoholic cardiomyopathy, hepatic encephalopathy, colon cancer status post left hemicolectomy and 2007 peptic ulcer disease, SVT and prolonged QT who presents to the hospital for loss of appetite and severe weakness. In the ED he was noted to have numerous abnormal labs including: Sodium 125 Potassium 2.6 Phosphorus 1.7 Magnesium 1.4 Lipase 73 AST 180 ALT 61 Total bilirubin 13.9 Hemoglobin 8.9 Platelet 107 INR 2.4 Urine drug screen positive for THC CT scan of the chest abdomen pelvis reveals hepatic steatosis with cirrhosis, mesenteric edema with ascites, cholelithiasis and a prior colectomy.    Principal Problem:   Acute hepatitis with increased INR - he told me that his last drink was when he decided to have a few shots on Thanksgiving - appreciate GI eval- agree with Prednisolone - he has not had much improvement today  - he refused to speak with palliative care when they came to see him - I have advised him not to leave the hospital today but he insists on leaving AMA and states that he will come back after Christmas if he has to - I have spoken with his brother who is here to pick him up and is taking him home today and updated him on the patient's condition   Active Problems: Severe hyponatremia due to hypovolemia Lactic  acidosis - Lactic acid 3.7 - Na 126     Protein-calorie malnutrition, severe- cachexia    Hypomagnesemia   Hypophosphatemia   Hypokalemia - BMI 14.92 - nutritional deficiency in setting of diuretic use - K still 3.0 today and has been replaced     Ascites - Lasix and Aldactone are currently on hold as he was dehydrated when he came into the hospital    Prolonged Q-T interval on ECG - avoid QT prolonging meds     Discharge Instructions         The results of significant diagnostics from this hospitalization (including imaging, microbiology, ancillary and laboratory) are listed below for reference.    CT CHEST ABDOMEN PELVIS WO CONTRAST  Result Date: 09/23/2022 CLINICAL DATA:  Patient presents for failure to thrive, lethargy, loss of appetite. Query sepsis EXAM: CT CHEST, ABDOMEN AND PELVIS WITHOUT CONTRAST TECHNIQUE: Multidetector CT imaging of the chest, abdomen and pelvis was performed following the standard protocol without IV contrast. RADIATION DOSE REDUCTION: This exam was performed according to the departmental dose-optimization program which includes automated exposure control, adjustment of the mA and/or kV according to patient size and/or use of iterative reconstruction technique. COMPARISON:  CT 10/31/2021 and 08/31/2007 FINDINGS: CT CHEST FINDINGS Cardiovascular: Normal heart size. Coronary artery and aortic atherosclerotic calcification. No pericardial effusion. Mediastinum/Nodes: No enlarged mediastinal, hilar, or axillary lymph nodes. Thyroid gland, trachea, and esophagus demonstrate no significant findings. Lungs/Pleura: Paraseptal emphysema and bullous change in the lung apices. No focal consolidation, pleural effusion, or pneumothorax. Musculoskeletal: No chest wall mass or suspicious bone lesions  identified. CT ABDOMEN PELVIS FINDINGS Hepatobiliary: Redemonstrated geographic pattern of steatosis throughout the liver. Hepatomegaly. Nodular contour compatible with  cirrhosis. The gallbladder is mildly thick-walled and distended with stone in the gallbladder neck. No definite biliary dilation. Pancreas: Unremarkable. No pancreatic ductal dilatation or surrounding inflammatory changes. Spleen: Normal in size without focal abnormality. Adrenals/Urinary Tract: Adrenal glands are unremarkable. Kidneys are normal, without renal calculi, focal lesion, or hydronephrosis. Bladder is unremarkable. Stomach/Bowel: Postoperative change of partial colectomy with anastomosis in the left lower quadrant. The transverse colon upstream from the anastomosis is patulous and there is mild wall thickening of the colon about the anastomosis. Normal caliber large and small bowel. Stomach is unremarkable. Vascular/Lymphatic: Aortic atherosclerosis. No enlarged abdominal or pelvic lymph nodes. Reproductive: Unremarkable. Other: Mesenteric edema. Small amount of perihepatic ascites. No free intraperitoneal air. Musculoskeletal: No acute osseous abnormality. IMPRESSION: 1. Similar geographic pattern of steatosis throughout the liver which is enlarged with mild nodularity of the contour compatible with cirrhosis. 2. Partial colectomy. There is mild wall thickening of the transverse colon upstream from the anastomosis. This is nonspecific and is favored secondary to congestive colopathy, less likely infectious/inflammatory colitis. 3. Diffuse mesenteric edema and small volume ascites. 4. Cholelithiasis. Gallbladder wall thickening is favored due to liver pathology though acute cholecystitis is not excluded. 5.  Aortic Atherosclerosis (ICD10-I70.0). Electronically Signed   By: Placido Sou M.D.   On: 09/23/2022 21:33   DG Chest 1 View  Result Date: 09/23/2022 CLINICAL DATA:  Chest pain EXAM: CHEST  1 VIEW COMPARISON:  11/01/2021 FINDINGS: The heart size and mediastinal contours are within normal limits. Both lungs are clear. The visualized skeletal structures are unremarkable. IMPRESSION: No active  disease. Electronically Signed   By: Misty Stanley M.D.   On: 09/23/2022 18:21   Labs:   Basic Metabolic Panel: Recent Labs  Lab 09/23/22 1751 09/23/22 1854 09/24/22 0608 09/24/22 0821 09/24/22 1533 09/25/22 0515  NA 125* 123* 126* 125* 125* 126*  K 2.6* 2.8* 2.5* 2.5* 3.5 3.0*  CL 71* 72* 79* 78* 80* 87*  CO2 28  --  32 33* 32 26  GLUCOSE 89 90 108* 129* 122* 91  BUN '9 7 6 '$ 5* 5* <5*  CREATININE 0.71 0.80 0.52* 0.51* 0.57* 0.46*  CALCIUM 8.0*  --  8.5* 8.4* 8.6* 8.9  MG 1.4*  --  2.1 1.9  --   --   PHOS 1.7*  --  <1.0* <1.0*  --  <1.0*     CBC: Recent Labs  Lab 09/23/22 1751 09/23/22 1854 09/24/22 0608 09/24/22 0821 09/25/22 0515  WBC 8.6  --  6.7  --  7.4  NEUTROABS 6.6  --  4.9  --   --   HGB 8.9* 11.2* 8.7* 8.3* 7.9*  HCT 25.6* 33.0* 24.9* 24.4* 23.3*  MCV 80.5  --  79.6*  --  80.1  PLT 107*  --  92*  --  105*         SIGNED:   Debbe Odea, MD  [facility] 09/25/2022, 1:28 PM

## 2022-09-25 NOTE — Progress Notes (Signed)
Patient not stay still, up and down in room/bed packing and unpacking his belongings, saying ready to leave, repeatedly told the patient he is not discharged and as the MD told him this morning that he is not medically ready to be discharged. Noted patient and family leaving, informed family that patient have not been discharged. Patient alert and oriented.  Encouraged patient to go back to the room until we paged the MD. Family tried to convince patient to stay but patient refused.

## 2022-09-25 NOTE — Progress Notes (Signed)
Initial Nutrition Assessment RD working remotely.   DOCUMENTATION CODES:   Underweight  INTERVENTION:  - ordered Ensure Plus High Protein TID, each supplement provides 350 kcal and 20 grams of protein.  - ordered 30 ml Prosource Plus BID, each supplement provides 100 kcal and 15 grams protein.   - monitor magnesium, potassium, and phosphorus BID for at least 3 days, MD to replete as needed, as pt is at risk for refeeding syndrome given underweight status, hx of severe protein-calorie malnutrition, current hypokalemia and hypophosphatemia.  - complete NFPE when feasible.   NUTRITION DIAGNOSIS:   Increased nutrient needs related to acute illness, chronic illness as evidenced by estimated needs.  GOAL:   Patient will meet greater than or equal to 90% of their needs  MONITOR:   PO intake, Supplement acceptance, Labs, Weight trends  REASON FOR ASSESSMENT:   Malnutrition Screening Tool, Consult Assessment of nutrition requirement/status  ASSESSMENT:   56 year old male with medical history of alcoholic cirrhosis with ascites, alcoholic cardiomyopathy, hepatic encephalopathy, colon cancer s/p L hemicolectomy, peptic ulcer disease, acid reflux, HTN, alcoholic hepatitis, CHF, hepatic steatosis, pancreatitis, and severe protein calorie malnutrition. He presented to the ED due to loss of appetite and severe weakness.  The only documented meal intake percentage in the flow sheet was 10% of breakfast on 12/23.  Weight yesterday was 110 lb and weight on 05/31/22 was 119 lb. This indicates 9 lb (7.6%) weight loss in 4 months; not significant for time frame.   Patient has not been assessed by a [place] RD since 10/2021. At that time, he met criteria for severe malnutrition related to chronic illness as evidenced by severe muscle depletions and severe fat depletions. Suspect this is ongoing.  Per notes: - patient leaving AMA today (09/25/22)   Labs reviewed; Na: 126 mmol/l, K: 3  mmol/l, Cl: 87 mmol/l, BUN: <5 mg/dl, creatinine: 0.46 mg/dl, Phos: <1 mg/dl.  Medications reviewed; 1 mg folvite/day, 10 g lactulose TID, 1 tablet multivitamin with minerals/day, 10 mEq IV KCl x6 runs 12/23, 40 mg prednisolone/day, 100 mg oral thiamine/day.    NUTRITION - FOCUSED PHYSICAL EXAM:  RD working remotely.  Diet Order:   Diet Order             Diet Heart Room service appropriate? Yes; Fluid consistency: Thin  Diet effective now                   EDUCATION NEEDS:   Not appropriate for education at this time  Skin:  Skin Assessment: Reviewed RN Assessment  Last BM:  12/24 (type 6 x2, both medium amounts)  Height:   Ht Readings from Last 1 Encounters:  09/24/22 6' (1.829 m)    Weight:   Wt Readings from Last 1 Encounters:  09/24/22 49.9 kg     BMI:  Body mass index is 14.92 kg/m.  Estimated Nutritional Needs:  Kcal:  1750-1950 kcal Protein:  90-110 grams Fluid:  >/= 2 L/day     Jarome Matin, MS, RD, LDN, CNSC Clinical Dietitian PRN/Relief staff On-call/weekend pager # available in [facility]

## 2022-09-25 NOTE — Evaluation (Signed)
Physical Therapy Evaluation-1x Patient Details Name: Richard [name] MRN: [id number] DOB: 07/08/1966 Today's Date: 09/25/2022  History of Present Illness  56 yo male admitted with acute hepatitis, increased INR, hyponatremia, axcites. Hx of ETOH abuse, ETOH WD, cirrhosis, colon ca, compression fx, SVT, CAD, CHF  Clinical Impression  On eval, pt was Mod Ind with mobility. He walked ~230 feet around th unit. No overt LOB although he is mildly unsteady intermittently. Pt reports he uses a cane PRN. Pt participated well. Will defer further mobility needs to nursing and/or mobility team. 1x eval. Will sign off.      Recommendations for follow up therapy are one component of a multi-disciplinary discharge planning process, led by the attending physician.  Recommendations may be updated based on patient status, additional functional criteria and insurance authorization.  Follow Up Recommendations No PT follow up      Assistance Recommended at Discharge PRN  Patient can return home with the following  Assistance with cooking/housework;Assist for transportation;Help with stairs or ramp for entrance    Equipment Recommendations None recommended by PT  Recommendations for Other Services       Functional Status Assessment Patient has had a recent decline in their functional status and demonstrates the ability to make significant improvements in function in a reasonable and predictable amount of time.     Precautions / Restrictions Restrictions Weight Bearing Restrictions: No      Mobility  Bed Mobility               General bed mobility comments: oob in recliner    Transfers Overall transfer level: Modified independent                      Ambulation/Gait Ambulation/Gait assistance: Modified independent (Device/Increase time) Gait Distance (Feet): 230 Feet Assistive device: None Gait Pattern/deviations: Step-through pattern, Decreased stride length        General Gait Details: Mild instability intermittently but no overt LOB. Pt tolerated distance well.  Stairs            Wheelchair Mobility    Modified Rankin (Stroke Patients Only)       Balance Overall balance assessment: Mild deficits observed, not formally tested                                           Pertinent Vitals/Pain Pain Assessment Pain Assessment: No/denies pain    Home Living Family/patient expects to be discharged to:: Private residence Living Arrangements: Other relatives Available Help at Discharge: Family;Available PRN/intermittently Type of Home: Apartment Home Access: Elevator       Home Layout: One level Home Equipment: Cane - single point      Prior Function Prior Level of Function : Independent/Modified Independent;History of Falls (last six months)             Mobility Comments: uses cane PRN.       Hand Dominance        Extremity/Trunk Assessment   Upper Extremity Assessment Upper Extremity Assessment: Defer to OT evaluation    Lower Extremity Assessment Lower Extremity Assessment: Overall WFL for tasks assessed    Cervical / Trunk Assessment Cervical / Trunk Assessment: Normal  Communication   Communication: No difficulties  Cognition Arousal/Alertness: Awake/alert Behavior During Therapy: WFL for tasks assessed/performed Overall Cognitive Status: Within Functional Limits for tasks assessed  General Comments      Exercises     Assessment/Plan    PT Assessment Patient does not need any further PT services  PT Problem List         PT Treatment Interventions      PT Goals (Current goals can be found in the Care Plan section)  Acute Rehab PT Goals Patient Stated Goal: home soon PT Goal Formulation: All assessment and education complete, DC therapy    Frequency       Co-evaluation               AM-PAC PT "6 Clicks"  Mobility  Outcome Measure Help needed turning from your back to your side while in a flat bed without using bedrails?: None Help needed moving from lying on your back to sitting on the side of a flat bed without using bedrails?: None Help needed moving to and from a bed to a chair (including a wheelchair)?: None Help needed standing up from a chair using your arms (e.g., wheelchair or bedside chair)?: None Help needed to walk in hospital room?: A Little Help needed climbing 3-5 steps with a railing? : A Little 6 Click Score: 22    End of Session Equipment Utilized During Treatment: Gait belt Activity Tolerance: Patient tolerated treatment well Patient left: in chair;with call bell/phone within reach;with chair alarm set        Time: 0174-9449 PT Time Calculation (min) (ACUTE ONLY): 11 min   Charges:   PT Evaluation $PT Eval Low Complexity: 1 Low            Doreatha Massed, PT Acute Rehabilitation  Office: [phone] '

## 2022-09-25 NOTE — Consult Note (Signed)
Consultation Note Date: 09/25/2022   Patient Name: Richard [name]  DOB: 1965/10/05  MRN: [id number]  Age / Sex: 56 y.o., male  PCP: Charlott Rakes, MD Referring Physician: No att. providers found  Reason for Consultation: Establishing goals of care  HPI/Patient Profile: 56 y.o. male   admitted on 09/23/2022    Clinical Assessment and Goals of Care: 56 year old gentleman with alcoholic cirrhosis, alcoholic cardiomyopathy, hepatic encephalopathy, colon cancer status post left hemicolectomy, history of peptic ulcer disease SVT prolonged QT interval.  Patient admitted to the hospital with loss of appetite severe weakness and electrolyte abnormalities, urine drug screen positive for THC.  CT abdomen with hepatic steatosis with cirrhosis mesenteric edema with ascites cholelithiasis and prior colectomy.  Palliative medicine team consulted for ongoing goals of care discussions. Chart reviewed, PMT consult request received, GI colleagues are also following, patient placed on prednisolone for acute hepatitis, increased INR. Patient is awake alert sitting up in a chair.   Palliative medicine is specialized medical care for people living with serious illness. It focuses on providing relief from the symptoms and stress of a serious illness. The goal is to improve quality of life for both the patient and the family. Goals of care: Broad aims of medical therapy in relation to the patient's values and preferences. Our aim is to provide medical care aimed at enabling patients to achieve the goals that matter most to them, given the circumstances of their particular medical situation and their constraints.   Patient becomes irritated and states that he does not want to discuss goals of care.  He states that that he should be out of the hospital, states that he is feeling fine, states that he needs to be out Christmas shopping  for his 59 and 54 year old.  Discussed with the patient about scope of current hospitalization to the best of my ability.  Recommend outpatient palliative care.  NEXT OF KIN States that he lives at home with a cousin.  States that he has a 88 year old and a 54 year old that lives with their mother his former spouse.  States that he is not currently married.  In demographics, also noted to have a brother and mother.  SUMMARY OF RECOMMENDATIONS   Full code full scope for now Recommend outpatient palliative care  Code Status/Advance Care Planning: Full code   Symptom Management:  Continue current mode of care  Palliative Prophylaxis:  Delirium Protocol  Additional Recommendations (Limitations, Scope, Preferences): Full Scope Treatment  Psycho-social/Spiritual:  Desire for further Chaplaincy support:yes Additional Recommendations: Caregiving  Support/Resources  Prognosis:  Unable to determine  Discharge Planning: To Be Determined      Primary Diagnoses: Present on Admission:  Protein-calorie malnutrition, severe  Hypophosphatemia  Hypomagnesemia  Ascites  Prolonged Q-T interval on ECG  Acute hepatitis   I have reviewed the medical record, interviewed the patient and family, and examined the patient. The following aspects are pertinent.  Past Medical History:  Diagnosis Date   Abnormal nuclear cardiac imaging test    Acid reflux  Acute pancreatitis 08/14/2018   Alcohol withdrawal delirium ([place]) 10/62/6948   Alcoholic cardiomyopathy ([place]) 02/03/6269   Alcoholic hepatitis    Alcoholic ketoacidosis 3/50/0938   Ascites 12/13/2019   Aspiration pneumonia ([place]) 08/20/2016   Chest pain of uncertain etiology    Chronic systolic CHF (congestive heart failure) (HCC)    Cirrhosis (HCC)    Colon cancer ([place])    DCM (dilated cardiomyopathy) ([place])    Drug abuse ([place]) 01/07/2020   Elevated troponin 06/27/2018   ETOH abuse    Gastropathy 08/14/2018   Heme positive stool  11/13/2017   Hepatic steatosis 08/21/2016   High anion gap metabolic acidosis 10/11/2991   History of colon cancer    HTN (hypertension)    Hypertensive urgency 06/27/2018   Hypoglycemia 06/27/2018   Hypokalemia 01/07/2020   Hypomagnesemia    Hypophosphatemia    Lactic acidosis 08/20/2016   Leukocytosis 01/07/2020   Pancreatitis 08/2018   Polyp of ascending colon    Prolonged Q-T interval on ECG    Prolonged QT interval    Protein-calorie malnutrition, severe 05/02/2018   PUD (peptic ulcer disease)    SBP (spontaneous bacterial peritonitis) ([place]) 01/07/2020   Sepsis ([place]) 08/20/2016   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   SVT (supraventricular tachycardia)    Thrombocytopenia ([place]) 08/21/2016   Social History   Socioeconomic History   Marital status: Single    Spouse name: Not on file   Number of children: Not on file   Years of education: Not on file   Highest education level: Not on file  Occupational History   Not on file  Tobacco Use   Smoking status: Every Day    Packs/day: 1.00    Types: Cigarettes   Smokeless tobacco: Never  Vaping Use   Vaping Use: Never used  Substance and Sexual Activity   Alcohol use: Not Currently    Alcohol/week: 2.0 standard drinks of alcohol    Types: 1 Cans of beer, 1 Shots of liquor per week    Comment: last drink in February   Drug use: Yes    Types: Marijuana   Sexual activity: Not Currently  Other Topics Concern   Not on file  Social History Narrative   Not on file   Social Determinants of Health   Financial Resource Strain: High Risk (06/03/2020)   Overall Financial Resource Strain (CARDIA)    Difficulty of Paying Living Expenses: Very hard  Food Insecurity: Food Insecurity Present (09/24/2022)   Hunger Vital Sign    Worried About Running Out of Food in the Last Year: Sometimes true    Ran Out of Food in the Last Year: Sometimes true  Transportation Needs: No Transportation Needs (09/24/2022)   PRAPARE - Radiographer, therapeutic (Medical): No    Lack of Transportation (Non-Medical): No  Physical Activity: Not on file  Stress: Not on file  Social Connections: Not on file   Family History  Problem Relation Age of Onset   Hypertension Mother    Colon cancer Father    Cancer Sister        type unknown   Kidney disease Sister        dialysis   Colon cancer Cousin        x 2   CAD Neg Hx    Stroke Neg Hx    Diabetes Neg Hx    Stomach cancer Neg Hx    Esophageal cancer Neg Hx    Pancreatic cancer Neg Hx  Colon polyps Neg Hx    Scheduled Meds:  (feeding supplement) PROSource Plus  30 mL Oral BID BM   aspirin EC  81 mg Oral Daily   feeding supplement  237 mL Oral TID BM   [START ON 58/06/9832] folic acid  1 mg Oral Daily   heparin  5,000 Units Subcutaneous Q8H   lactulose  10 g Oral TID   [START ON 09/26/2022] multivitamin with minerals  1 tablet Oral Daily   pantoprazole  40 mg Oral Daily   potassium chloride  40 mEq Oral Q4H   prednisoLONE  40 mg Oral Daily   thiamine  100 mg Oral Daily   Or   thiamine  100 mg Intravenous Daily   thiamine  100 mg Oral Daily   Continuous Infusions: PRN Meds:.acetaminophen, albuterol, LORazepam **OR** LORazepam Medications Prior to Admission:  Prior to Admission medications   Medication Sig Start Date End Date Taking? Authorizing Provider  furosemide (LASIX) 20 MG tablet TAKE 1 TABLET(20 MG) BY MOUTH DAILY 08/09/22  Yes Newlin, Enobong, MD  gabapentin (NEURONTIN) 300 MG capsule TAKE 2 CAPSULES(600 MG) BY MOUTH THREE TIMES DAILY 09/13/22  Yes Charlott Rakes, MD  naloxone (NARCAN) nasal spray 4 mg/0.1 mL Place 1 spray into the nose as needed (accidental overdose). 07/06/22  Yes [provider]  oxyCODONE-acetaminophen (PERCOCET) 10-325 MG tablet Take 1 tablet by mouth 4 (four) times daily as needed for pain. 08/26/22  Yes [provider]  pantoprazole (PROTONIX) 40 MG tablet TAKE 1 TABLET(40 MG) BY MOUTH DAILY 08/09/22  Yes Charlott Rakes, MD  aspirin 81 MG EC tablet Take 1 tablet (81 mg total) by mouth daily. Patient not taking: Reported on 05/31/2022 06/16/20   Fulp, Ander Gaster, MD  baclofen (LIORESAL) 10 MG tablet Take 1 tablet (10 mg total) by mouth 3 (three) times daily. Patient not taking: Reported on 05/31/2022 02/08/22   Charlott Rakes, MD  Cholecalciferol (VITAMIN D-3) 125 MCG (5000 UT) TABS Take 2 PO qd x 3 months, then 1 PO qd long-term after that Patient not taking: Reported on 05/31/2022 11/11/20   Hilts, Legrand Como, MD  DULoxetine (CYMBALTA) 60 MG capsule Take 1 capsule (60 mg total) by mouth daily. Patient not taking: Reported on 05/31/2022 11/09/21 11/09/22  Charlott Rakes, MD  ferrous gluconate (FERGON) 324 MG tablet Take 1 tablet (324 mg total) by mouth daily with breakfast. Patient not taking: Reported on 05/31/2022 11/11/20   Hilts, Legrand Como, MD  Hyoscyamine Sulfate SL 0.125 MG SUBL Take 1 tablet ( 0.125 mg)  by mouth 2 (two) times daily. Patient not taking: Reported on 05/31/2022 12/17/21 12/17/22  Zehr, Laban Emperor, PA-C  lactulose (CHRONULAC) 10 GM/15ML solution TAKE 30 ML BY MOUTH TWICE DAILY Patient not taking: Reported on 09/24/2022 08/30/22   Charlott Rakes, MD  magnesium oxide (MAG-OX) 400 (241.3 Mg) MG tablet Take 1 tablet (400 mg total) by mouth daily. Patient not taking: Reported on 05/31/2022 06/16/20   Fulp, Ander Gaster, MD  midodrine (PROAMATINE) 5 MG tablet Take 1 tablet (5 mg total) by mouth 3 (three) times daily with meals. Patient not taking: Reported on 05/31/2022 11/05/21   Kathie Dike, MD  spironolactone (ALDACTONE) 100 MG tablet Take 1 tablet (100 mg total) by mouth daily. Patient not taking: Reported on 05/31/2022 11/05/21 02/03/22  Kathie Dike, MD  thiamine 100 MG tablet Take 1 tablet (100 mg total) by mouth daily. Patient not taking: Reported on 05/31/2022 11/06/21   Kathie Dike, MD   Allergies  Allergen Reactions  Aspirin Other (See Comments)    Acid reflux    Aspirin Other (See Comments)    Caused  acid reflux   Penicillins Hives    Has patient had a PCN reaction causing immediate rash, facial/tongue/throat swelling, SOB or lightheadedness with hypotension: yes Has patient had a PCN reaction causing severe rash involving mucus membranes or skin necrosis: no Has patient had a PCN reaction that required hospitalization: no Has patient had a PCN reaction occurring within the last 10 years: no If all of the above answers are "NO", then may proceed with Cephalosporin use.    Penicillins Hives   Review of Systems Denies pain Physical Exam Awake alert Sitting up in a chair Appears with scleral icterus Abdomen is moderately distended Regular work of breathing  Vital Signs: BP 121/73   Pulse 72   Temp 98 F (36.7 C) (Oral)   Resp 14   Ht 6' (1.829 m)   Wt 49.9 kg   SpO2 98%   BMI 14.92 kg/m  Pain Scale: 0-10   Pain Score: 0-No pain   SpO2: SpO2: 98 % O2 Device:SpO2: 98 % O2 Flow Rate: .   IO: Intake/output summary:  Intake/Output Summary (Last 24 hours) at 09/25/2022 1324 Last data filed at 09/25/2022 0933 Gross per 24 hour  Intake 1300.42 ml  Output 300 ml  Net 1000.42 ml    LBM:   Baseline Weight: Weight: 49.9 kg Most recent weight: Weight: 49.9 kg     Palliative Assessment/Data:   PPS 60%  Time In:  12:30 Time Out:  1330 Time Total:  60  Greater than 50%  of this time was spent counseling and coordinating care related to the above assessment and plan.  Signed by: Loistine Chance, MD   Please contact Palliative Medicine Team phone at [phone] for questions and concerns.  For individual provider: See Shea Evans

## 2022-09-25 NOTE — Progress Notes (Signed)
Pt axox4 and requesting to leave AMA. MD paged. Pt educated on the importance of staying in the hospital for medical treatment. He still decided to leave AMA, paper signed and placed in chart. IV and tele monitor removed. Family at bedside to transport patient to his residence. MD notified of patient leaving.

## 2022-09-25 NOTE — Progress Notes (Signed)
[facility] Progress Note  Patient: Richard [name]     XBJ:[id number]  DOA: 09/23/2022   PCP: Charlott Rakes, MD       Brief hospital course: This is a 56 year old male with alcoholic cirrhosis, alcoholic cardiomyopathy, hepatic encephalopathy, colon cancer status post left hemicolectomy and 2007 peptic ulcer disease, SVT and prolonged QT who presents to the hospital for loss of appetite and severe weakness. In the ED he was noted to have numerous abnormal labs including: Sodium 125 Potassium 2.6 Phosphorus 1.7 Magnesium 1.4 Lipase 73 AST 180 ALT 61 Total bilirubin 13.9 Hemoglobin 8.9 Platelet 107 INR 2.4 Urine drug screen positive for THC CT scan of the chest abdomen pelvis reveals hepatic steatosis with cirrhosis, mesenteric edema with ascites, cholelithiasis and a prior colectomy.  Subjective:  He wants to be discharged. Has no other complaints.   Assessment and Plan: Principal Problem:   Acute hepatitis with increased INR - he told me that his last drink was when he decided to have a few shots on Thanksgiving - appreciate GI eval- agree with Prednisolone - he has not had much improvement today- I have advised him not to leave the hospital today  Active Problems: Severe hyponatremia due to hypovolemia Lactic acidosis - Lactic acid 3.7 - Na 125- diuretics on hold - will dc NS today as he is re accumulating ascites    Protein-calorie malnutrition, severe- cachexia    Hypomagnesemia   Hypophosphatemia   Hypokalemia - nutritional deficiency in setting of diuretic use - will request nutritionist evaluation - cont to replace and follow- K still 3.0 today - follow on telemetry as well    Ascites - Lasix and Aldactone are currently on hold - will be stopping IVF today   Prolonged Q-T interval on ECG - avoid QT prolonging meds     Code Status: Full Code Consultants: GI Level of Care: Level of care: Progressive Total time on patient care: 30  min DVT prophylaxis:  heparin injection 5,000 Units Start: 09/24/22 0600     Objective:   Vitals:   09/24/22 0610 09/24/22 0814 09/24/22 1351 09/24/22 2011  BP: 104/70 96/61 117/71 121/73  Pulse: 83 68 73 72  Resp:  15 14   Temp: (!) 97.4 F (36.3 C) (!) 97 F (36.1 C) 98 F (36.7 C) 98 F (36.7 C)  TempSrc: Oral  Oral Oral  SpO2: 100% 100% 97% 98%  Weight:      Height:       Filed Weights   09/24/22 0248  Weight: 49.9 kg   Exam: General exam: Appears comfortable  HEENT: oral mucosa moist Respiratory system: Clear to auscultation.  Cardiovascular system: S1 & S2 heard  Gastrointestinal system: Abdomen soft, non-tender, moderately distended. Normal bowel sounds   Extremities: No cyanosis, clubbing or edema Psychiatry:  Mood & affect appropriate.        CBC: Recent Labs  Lab 09/23/22 1751 09/23/22 1854 09/24/22 0608 09/24/22 0821 09/25/22 0515  WBC 8.6  --  6.7  --  7.4  NEUTROABS 6.6  --  4.9  --   --   HGB 8.9* 11.2* 8.7* 8.3* 7.9*  HCT 25.6* 33.0* 24.9* 24.4* 23.3*  MCV 80.5  --  79.6*  --  80.1  PLT 107*  --  92*  --  105*    Basic Metabolic Panel: Recent Labs  Lab 09/23/22 1751 09/23/22 1854 09/24/22 0608 09/24/22 0821 09/24/22 1533 09/25/22 0515  NA 125* 123* 126* 125* 125* 126*  K  2.6* 2.8* 2.5* 2.5* 3.5 3.0*  CL 71* 72* 79* 78* 80* 87*  CO2 28  --  32 33* 32 26  GLUCOSE 89 90 108* 129* 122* 91  BUN '9 7 6 '$ 5* 5* <5*  CREATININE 0.71 0.80 0.52* 0.51* 0.57* 0.46*  CALCIUM 8.0*  --  8.5* 8.4* 8.6* 8.9  MG 1.4*  --  2.1 1.9  --   --   PHOS 1.7*  --  <1.0* <1.0*  --   --     GFR: Estimated Creatinine Clearance: 74.5 mL/min (A) (by C-G formula based on SCr of 0.46 mg/dL (L)).  Scheduled Meds:  aspirin EC  81 mg Oral Daily   folic acid  1 mg Oral Daily   heparin  5,000 Units Subcutaneous Q8H   lactulose  10 g Oral TID   multivitamin with minerals  1 tablet Oral Daily   pantoprazole  40 mg Oral Daily   potassium chloride  40 mEq Oral  Q4H   prednisoLONE  40 mg Oral Daily   thiamine  100 mg Oral Daily   Continuous Infusions:   Imaging and lab data was personally reviewed CT CHEST ABDOMEN PELVIS WO CONTRAST  Result Date: 09/23/2022 CLINICAL DATA:  Patient presents for failure to thrive, lethargy, loss of appetite. Query sepsis EXAM: CT CHEST, ABDOMEN AND PELVIS WITHOUT CONTRAST TECHNIQUE: Multidetector CT imaging of the chest, abdomen and pelvis was performed following the standard protocol without IV contrast. RADIATION DOSE REDUCTION: This exam was performed according to the departmental dose-optimization program which includes automated exposure control, adjustment of the mA and/or kV according to patient size and/or use of iterative reconstruction technique. COMPARISON:  CT 10/31/2021 and 08/31/2007 FINDINGS: CT CHEST FINDINGS Cardiovascular: Normal heart size. Coronary artery and aortic atherosclerotic calcification. No pericardial effusion. Mediastinum/Nodes: No enlarged mediastinal, hilar, or axillary lymph nodes. Thyroid gland, trachea, and esophagus demonstrate no significant findings. Lungs/Pleura: Paraseptal emphysema and bullous change in the lung apices. No focal consolidation, pleural effusion, or pneumothorax. Musculoskeletal: No chest wall mass or suspicious bone lesions identified. CT ABDOMEN PELVIS FINDINGS Hepatobiliary: Redemonstrated geographic pattern of steatosis throughout the liver. Hepatomegaly. Nodular contour compatible with cirrhosis. The gallbladder is mildly thick-walled and distended with stone in the gallbladder neck. No definite biliary dilation. Pancreas: Unremarkable. No pancreatic ductal dilatation or surrounding inflammatory changes. Spleen: Normal in size without focal abnormality. Adrenals/Urinary Tract: Adrenal glands are unremarkable. Kidneys are normal, without renal calculi, focal lesion, or hydronephrosis. Bladder is unremarkable. Stomach/Bowel: Postoperative change of partial colectomy with  anastomosis in the left lower quadrant. The transverse colon upstream from the anastomosis is patulous and there is mild wall thickening of the colon about the anastomosis. Normal caliber large and small bowel. Stomach is unremarkable. Vascular/Lymphatic: Aortic atherosclerosis. No enlarged abdominal or pelvic lymph nodes. Reproductive: Unremarkable. Other: Mesenteric edema. Small amount of perihepatic ascites. No free intraperitoneal air. Musculoskeletal: No acute osseous abnormality. IMPRESSION: 1. Similar geographic pattern of steatosis throughout the liver which is enlarged with mild nodularity of the contour compatible with cirrhosis. 2. Partial colectomy. There is mild wall thickening of the transverse colon upstream from the anastomosis. This is nonspecific and is favored secondary to congestive colopathy, less likely infectious/inflammatory colitis. 3. Diffuse mesenteric edema and small volume ascites. 4. Cholelithiasis. Gallbladder wall thickening is favored due to liver pathology though acute cholecystitis is not excluded. 5.  Aortic Atherosclerosis (ICD10-I70.0). Electronically Signed   By: Placido Sou M.D.   On: 09/23/2022 21:33   DG Chest 1 View  Result Date: 09/23/2022 CLINICAL DATA:  Chest pain EXAM: CHEST  1 VIEW COMPARISON:  11/01/2021 FINDINGS: The heart size and mediastinal contours are within normal limits. Both lungs are clear. The visualized skeletal structures are unremarkable. IMPRESSION: No active disease. Electronically Signed   By: Misty Stanley M.D.   On: 09/23/2022 18:21    LOS: 2 days   Author: Eunice Blase Elajah Kunsman  09/25/2022 12:01 PM  To contact [facility]>   Check the care team in [facility] and look for the attending/consulting [place] provider listed  Log into [email or web address] and use [place]'s universal password   Go to> "[facility]"  and find provider  If you still have difficulty reaching the provider, please page the [facility] (Director on Call) for the Hospitalists  listed on amion

## 2022-09-25 NOTE — Plan of Care (Signed)
Patient able ambulate with assistance to bathroom.  Multiple stools related to lactulose consumption.  Urine  remains dark amber and small volume per urination. Mentation seems clear with occasional momentary confusion.skin is yellow and intact.

## 2022-09-26 LAB — AFP TUMOR MARKER: AFP, Serum, Tumor Marker: 2.8 ng/mL (ref 0.0–8.4)

## 2022-09-27 ENCOUNTER — Telehealth: Payer: Self-pay

## 2022-09-27 LAB — HEMOGLOBIN A1C
Hgb A1c MFr Bld: <4.2 % — ABNORMAL LOW (ref 4.8–5.6)
Mean Plasma Glucose: <74 mg/dL

## 2022-09-27 NOTE — Telephone Encounter (Signed)
Transition Care Management Unsuccessful Follow-up Telephone Call  Date of discharge and from where:  09/25/2022, [facility] - left AMA  Attempts:  1st Attempt  Reason for unsuccessful TCM follow-up call:  Unable to reach patient- [phone], the recording stated that the call cannot be completed at this time .

## 2022-09-28 ENCOUNTER — Telehealth: Payer: Self-pay

## 2022-09-28 ENCOUNTER — Inpatient Hospital Stay (HOSPITAL_COMMUNITY)
Admission: EM | Admit: 2022-09-28 | Discharge: 2022-10-02 | DRG: 433 | Disposition: A | Discharge status: Home / Self Care | Payer: Medicaid Other | Attending: Internal Medicine | Admitting: Internal Medicine

## 2022-09-28 ENCOUNTER — Encounter (HOSPITAL_COMMUNITY): Payer: Self-pay | Admitting: Family Medicine

## 2022-09-28 ENCOUNTER — Other Ambulatory Visit: Payer: Self-pay

## 2022-09-28 DIAGNOSIS — F1020 Alcohol dependence, uncomplicated: Secondary | ICD-10-CM | POA: Diagnosis present

## 2022-09-28 DIAGNOSIS — K7031 Alcoholic cirrhosis of liver with ascites: Secondary | ICD-10-CM | POA: Diagnosis not present

## 2022-09-28 DIAGNOSIS — E876 Hypokalemia: Secondary | ICD-10-CM | POA: Diagnosis not present

## 2022-09-28 DIAGNOSIS — D696 Thrombocytopenia, unspecified: Secondary | ICD-10-CM | POA: Diagnosis not present

## 2022-09-28 DIAGNOSIS — Z8249 Family history of ischemic heart disease and other diseases of the circulatory system: Secondary | ICD-10-CM | POA: Diagnosis not present

## 2022-09-28 DIAGNOSIS — K7010 Alcoholic hepatitis without ascites: Secondary | ICD-10-CM | POA: Diagnosis present

## 2022-09-28 DIAGNOSIS — Z9049 Acquired absence of other specified parts of digestive tract: Secondary | ICD-10-CM | POA: Diagnosis not present

## 2022-09-28 DIAGNOSIS — K7011 Alcoholic hepatitis with ascites: Secondary | ICD-10-CM | POA: Diagnosis not present

## 2022-09-28 DIAGNOSIS — R531 Weakness: Secondary | ICD-10-CM | POA: Diagnosis not present

## 2022-09-28 DIAGNOSIS — I5022 Chronic systolic (congestive) heart failure: Secondary | ICD-10-CM | POA: Diagnosis present

## 2022-09-28 DIAGNOSIS — Z800 Family history of malignant neoplasm of digestive organs: Secondary | ICD-10-CM | POA: Diagnosis not present

## 2022-09-28 DIAGNOSIS — I110 Hypertensive heart disease with heart failure: Secondary | ICD-10-CM | POA: Diagnosis present

## 2022-09-28 DIAGNOSIS — E861 Hypovolemia: Secondary | ICD-10-CM | POA: Diagnosis present

## 2022-09-28 DIAGNOSIS — E440 Moderate protein-calorie malnutrition: Secondary | ICD-10-CM | POA: Diagnosis present

## 2022-09-28 DIAGNOSIS — Z85038 Personal history of other malignant neoplasm of large intestine: Secondary | ICD-10-CM | POA: Diagnosis not present

## 2022-09-28 DIAGNOSIS — K8020 Calculus of gallbladder without cholecystitis without obstruction: Secondary | ICD-10-CM | POA: Diagnosis present

## 2022-09-28 DIAGNOSIS — Z681 Body mass index (BMI) 19 or less, adult: Secondary | ICD-10-CM | POA: Diagnosis not present

## 2022-09-28 DIAGNOSIS — M25519 Pain in unspecified shoulder: Secondary | ICD-10-CM | POA: Diagnosis not present

## 2022-09-28 DIAGNOSIS — F17210 Nicotine dependence, cigarettes, uncomplicated: Secondary | ICD-10-CM | POA: Diagnosis present

## 2022-09-28 DIAGNOSIS — R64 Cachexia: Secondary | ICD-10-CM | POA: Diagnosis present

## 2022-09-28 DIAGNOSIS — D684 Acquired coagulation factor deficiency: Secondary | ICD-10-CM | POA: Diagnosis present

## 2022-09-28 DIAGNOSIS — K7682 Hepatic encephalopathy: Secondary | ICD-10-CM | POA: Diagnosis not present

## 2022-09-28 DIAGNOSIS — E871 Hypo-osmolality and hyponatremia: Secondary | ICD-10-CM | POA: Diagnosis present

## 2022-09-28 DIAGNOSIS — K7460 Unspecified cirrhosis of liver: Secondary | ICD-10-CM | POA: Diagnosis present

## 2022-09-28 LAB — AMMONIA: Ammonia: 59 umol/L — ABNORMAL HIGH (ref 9–35)

## 2022-09-28 LAB — COMPREHENSIVE METABOLIC PANEL
ALT: 65 U/L — ABNORMAL HIGH (ref 0–44)
AST: 131 U/L — ABNORMAL HIGH (ref 15–41)
Albumin: 2.9 g/dL — ABNORMAL LOW (ref 3.5–5.0)
Alkaline Phosphatase: 95 U/L (ref 38–126)
Anion gap: 14 (ref 5–15)
BUN: 7 mg/dL (ref 6–20)
CO2: 20 mmol/L — ABNORMAL LOW (ref 22–32)
Calcium: 9 mg/dL (ref 8.9–10.3)
Chloride: 95 mmol/L — ABNORMAL LOW (ref 98–111)
Creatinine, Ser: 0.39 mg/dL — ABNORMAL LOW (ref 0.61–1.24)
GFR, Estimated: >60 mL/min (ref >60)
Glucose, Bld: 85 mg/dL (ref 70–99)
Potassium: 2.9 mmol/L — ABNORMAL LOW (ref 3.5–5.1)
Sodium: 129 mmol/L — ABNORMAL LOW (ref 135–145)
Total Bilirubin: 17.5 mg/dL — ABNORMAL HIGH (ref 0.3–1.2)
Total Protein: 7.8 g/dL (ref 6.5–8.1)

## 2022-09-28 LAB — MAGNESIUM: Magnesium: 1.2 mg/dL — ABNORMAL LOW (ref 1.7–2.4)

## 2022-09-28 LAB — CBC WITH DIFFERENTIAL/PLATELET
Abs Immature Granulocytes: 0.1 10*3/uL — ABNORMAL HIGH (ref 0.00–0.07)
Basophils Absolute: 0 10*3/uL (ref 0.0–0.1)
Basophils Relative: 0 %
Eosinophils Absolute: 0.2 10*3/uL (ref 0.0–0.5)
Eosinophils Relative: 2 %
HCT: 24.4 % — ABNORMAL LOW (ref 39.0–52.0)
Hemoglobin: 8.1 g/dL — ABNORMAL LOW (ref 13.0–17.0)
Immature Granulocytes: 1 %
Lymphocytes Relative: 11 %
Lymphs Abs: 1 10*3/uL (ref 0.7–4.0)
MCH: 28.3 pg (ref 26.0–34.0)
MCHC: 33.2 g/dL (ref 30.0–36.0)
MCV: 85.3 fL (ref 80.0–100.0)
Monocytes Absolute: 1 10*3/uL (ref 0.1–1.0)
Monocytes Relative: 11 %
Neutro Abs: 7.1 10*3/uL (ref 1.7–7.7)
Neutrophils Relative %: 75 %
Platelets: 129 10*3/uL — ABNORMAL LOW (ref 150–400)
RBC: 2.86 MIL/uL — ABNORMAL LOW (ref 4.22–5.81)
RDW: 28.9 % — ABNORMAL HIGH (ref 11.5–15.5)
WBC: 9.5 10*3/uL (ref 4.0–10.5)
nRBC: 0 % (ref 0.0–0.2)

## 2022-09-28 LAB — ETHANOL: Alcohol, Ethyl (B): 11 mg/dL — ABNORMAL HIGH (ref <10)

## 2022-09-28 LAB — LACTIC ACID, PLASMA: Lactic Acid, Venous: 2.1 mmol/L (ref 0.5–1.9)

## 2022-09-28 LAB — LIPASE, BLOOD: Lipase: 42 U/L (ref 11–51)

## 2022-09-28 MED ORDER — POTASSIUM CHLORIDE IN NACL 20-0.9 MEQ/L-% IV SOLN
INTRAVENOUS | Status: AC
  Filled 2022-09-28 (×3): qty 1000

## 2022-09-28 MED ORDER — LORAZEPAM 1 MG PO TABS: 1.0000 mg | ORAL_TABLET | ORAL | Status: AC | PRN

## 2022-09-28 MED ORDER — POTASSIUM CHLORIDE CRYS ER 20 MEQ PO TBCR
40.0000 meq | EXTENDED_RELEASE_TABLET | Freq: Once | ORAL | Status: AC
  Administered 2022-09-28: 40 meq via ORAL
  Filled 2022-09-28: qty 2

## 2022-09-28 MED ORDER — LORAZEPAM 2 MG/ML IJ SOLN: 1.0000 mg | INTRAMUSCULAR | Status: AC | PRN

## 2022-09-28 MED ORDER — SENNOSIDES-DOCUSATE SODIUM 8.6-50 MG PO TABS: 1.0000 | ORAL_TABLET | Freq: Every evening | ORAL | Status: DC | PRN

## 2022-09-28 MED ORDER — ACETAMINOPHEN 650 MG RE SUPP: 650.0000 mg | Freq: Four times a day (QID) | RECTAL | Status: DC | PRN

## 2022-09-28 MED ORDER — ACETAMINOPHEN 325 MG PO TABS
650.0000 mg | ORAL_TABLET | Freq: Four times a day (QID) | ORAL | Status: DC | PRN
  Administered 2022-09-28 – 2022-09-29 (×2): 650 mg via ORAL
  Filled 2022-09-28 (×2): qty 2

## 2022-09-28 MED ORDER — MAGNESIUM SULFATE 2 GM/50ML IV SOLN
2.0000 g | Freq: Once | INTRAVENOUS | Status: AC
  Administered 2022-09-28: 2 g via INTRAVENOUS
  Filled 2022-09-28: qty 50

## 2022-09-28 MED ORDER — LACTULOSE 10 GM/15ML PO SOLN
30.0000 g | Freq: Two times a day (BID) | ORAL | Status: DC
  Administered 2022-09-28 – 2022-09-29 (×3): 30 g via ORAL
  Filled 2022-09-28 (×3): qty 45

## 2022-09-28 MED ORDER — ENOXAPARIN SODIUM 40 MG/0.4ML IJ SOSY
40.0000 mg | PREFILLED_SYRINGE | INTRAMUSCULAR | Status: DC
  Administered 2022-09-28 – 2022-10-01 (×4): 40 mg via SUBCUTANEOUS
  Filled 2022-09-28 (×4): qty 0.4

## 2022-09-28 MED ORDER — LORAZEPAM 2 MG/ML IJ SOLN: 0.0000 mg | Freq: Four times a day (QID) | INTRAMUSCULAR | Status: AC

## 2022-09-28 MED ORDER — ADULT MULTIVITAMIN W/MINERALS CH
1.0000 | ORAL_TABLET | Freq: Every day | ORAL | Status: DC
  Administered 2022-09-29 – 2022-10-02 (×4): 1 via ORAL
  Filled 2022-09-28 (×4): qty 1

## 2022-09-28 MED ORDER — FOLIC ACID 1 MG PO TABS
1.0000 mg | ORAL_TABLET | Freq: Every day | ORAL | Status: DC
  Administered 2022-09-29 – 2022-10-02 (×4): 1 mg via ORAL
  Filled 2022-09-28 (×4): qty 1

## 2022-09-28 MED ORDER — THIAMINE MONONITRATE 100 MG PO TABS
100.0000 mg | ORAL_TABLET | Freq: Every day | ORAL | Status: DC
  Administered 2022-09-29 – 2022-10-02 (×4): 100 mg via ORAL
  Filled 2022-09-28 (×4): qty 1

## 2022-09-28 MED ORDER — LORAZEPAM 2 MG/ML IJ SOLN: 0.0000 mg | Freq: Two times a day (BID) | INTRAMUSCULAR | Status: DC

## 2022-09-28 MED ORDER — PANTOPRAZOLE SODIUM 40 MG PO TBEC
40.0000 mg | DELAYED_RELEASE_TABLET | Freq: Every day | ORAL | Status: DC
  Administered 2022-09-29 – 2022-10-02 (×4): 40 mg via ORAL
  Filled 2022-09-28 (×4): qty 1

## 2022-09-28 MED ORDER — PREDNISONE 20 MG PO TABS: 40.0000 mg | ORAL_TABLET | Freq: Every day | ORAL | Status: DC

## 2022-09-28 MED ORDER — THIAMINE HCL 100 MG/ML IJ SOLN
100.0000 mg | Freq: Every day | INTRAMUSCULAR | Status: DC
  Filled 2022-09-28: qty 2

## 2022-09-28 MED ORDER — PREDNISOLONE 5 MG PO TABS
40.0000 mg | ORAL_TABLET | Freq: Every day | ORAL | Status: DC
  Administered 2022-09-29 – 2022-10-02 (×4): 40 mg via ORAL
  Filled 2022-09-28 (×7): qty 8

## 2022-09-28 NOTE — ED Triage Notes (Signed)
"  Not feeling well" family states having paranoia  Pt BIB [facility] from home for failure to thrive, lethargy,loss of appetite

## 2022-09-28 NOTE — ED Notes (Signed)
ED TO INPATIENT HANDOFF REPORT  ED Nurse Name and Phone #: Lanell Matar, RN  S Name/Age/Gender Richard [name] 56 y.o. male Room/Bed: WA12/WA12  Code Status   Code Status: Full Code  Home/SNF/Other Home Patient oriented to: self, place, and time Is this baseline? Yes   Triage Complete: Triage complete  Chief Complaint Alcoholic hepatitis [S49.67]  Triage Note "Not feeling well" family states having paranoia  Pt BIB [facility] from home for failure to thrive, lethargy,loss of appetite     Allergies Allergies  Allergen Reactions   Aspirin Other (See Comments)    Acid reflux    Aspirin Other (See Comments)    Caused acid reflux   Penicillins Hives    Has patient had a PCN reaction causing immediate rash, facial/tongue/throat swelling, SOB or lightheadedness with hypotension: yes Has patient had a PCN reaction causing severe rash involving mucus membranes or skin necrosis: no Has patient had a PCN reaction that required hospitalization: no Has patient had a PCN reaction occurring within the last 10 years: no If all of the above answers are "NO", then may proceed with Cephalosporin use.    Penicillins Hives    Level of Care/Admitting Diagnosis ED Disposition     ED Disposition  Admit   Condition  --   Comment  Hospital Area: [facility] [[id number]]  Level of Care: Telemetry [5]  Admit to tele based on following criteria: Monitor QTC interval  May admit patient to [facility] or [facility] if equivalent level of care is available:: Yes  Covid Evaluation: Asymptomatic - no recent exposure (last 10 days) testing not required  Diagnosis: Alcoholic hepatitis [591638]  Admitting Physician: Vianne Bulls [[id number]]  Attending Physician: Vianne Bulls [[id number]]  Certification:: I certify this patient will need inpatient services for at least 2 midnights  Estimated Length of Stay: 3          B Medical/Surgery History Past Medical History:   Diagnosis Date   Abnormal nuclear cardiac imaging test    Acid reflux    Acute pancreatitis 08/14/2018   Alcohol withdrawal delirium ([place]) 39/12/90   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   Alcoholic hepatitis    Alcoholic ketoacidosis 11/28/3333   Ascites 12/13/2019   Aspiration pneumonia ([place]) 08/20/2016   Chest pain of uncertain etiology    Chronic systolic CHF (congestive heart failure) ([place])    Cirrhosis ([place])    Colon cancer ([place])    DCM (dilated cardiomyopathy) ([place])    Drug abuse ([place]) 01/07/2020   Elevated troponin 06/27/2018   ETOH abuse    Gastropathy 08/14/2018   Heme positive stool 11/13/2017   Hepatic steatosis 08/21/2016   High anion gap metabolic acidosis 01/06/6255   History of colon cancer    HTN (hypertension)    Hypertensive urgency 06/27/2018   Hypoglycemia 06/27/2018   Hypokalemia 01/07/2020   Hypomagnesemia    Hypophosphatemia    Lactic acidosis 08/20/2016   Leukocytosis 01/07/2020   Pancreatitis 08/2018   Polyp of ascending colon    Prolonged Q-T interval on ECG    Prolonged QT interval    Protein-calorie malnutrition, severe 05/02/2018   PUD (peptic ulcer disease)    SBP (spontaneous bacterial peritonitis) ([place]) 01/07/2020   Sepsis ([place]) 08/20/2016   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   SVT (supraventricular tachycardia)    Thrombocytopenia ([place]) 08/21/2016   Past Surgical History:  Procedure Laterality Date   BIOPSY  12/14/2019   Procedure: BIOPSY;  Surgeon: Mauri Pole, MD;  Location: [facility];  Service: Endoscopy;;   catherization  2007   COLONOSCOPY WITH PROPOFOL N/A 12/14/2019   Procedure: COLONOSCOPY WITH PROPOFOL;  Surgeon: Mauri Pole, MD;  Location: [facility];  Service: Endoscopy;  Laterality: N/A;   ESOPHAGOGASTRODUODENOSCOPY (EGD) WITH PROPOFOL N/A 12/14/2019   Procedure: ESOPHAGOGASTRODUODENOSCOPY (EGD) WITH PROPOFOL;  Surgeon: Mauri Pole, MD;  Location: [facility];  Service: Endoscopy;  Laterality: N/A;    HERNIA REPAIR  1967   1 x at birth and at 56 years old   [place]  2007   OPEN REDUCTION INTERNAL FIXATION (ORIF) HAND Right 2012   3rd  digit   POLYPECTOMY  12/14/2019   Procedure: POLYPECTOMY;  Surgeon: Mauri Pole, MD;  Location: [facility];  Service: Endoscopy;;     A IV Location/Drains/Wounds Patient Lines/Drains/Airways Status     Active Line/Drains/Airways     Name Placement date Placement time Site Days   Peripheral IV 09/28/22 20 G Anterior;Left Forearm 09/28/22  2030  Forearm  less than 1            Intake/Output Last 24 hours No intake or output data in the 24 hours ending 09/28/22 2113  Labs/Imaging Results for orders placed or performed during the hospital encounter of 09/28/22 (from the past 48 hour(s))  CBC with Differential     Status: Abnormal   Collection Time: 09/28/22  1:54 PM  Result Value Ref Range   WBC 9.5 4.0 - 10.5 K/uL   RBC 2.86 (L) 4.22 - 5.81 MIL/uL   Hemoglobin 8.1 (L) 13.0 - 17.0 g/dL   HCT 24.4 (L) 39.0 - 52.0 %   MCV 85.3 80.0 - 100.0 fL   MCH 28.3 26.0 - 34.0 pg   MCHC 33.2 30.0 - 36.0 g/dL   RDW 28.9 (H) 11.5 - 15.5 %   Platelets 129 (L) 150 - 400 K/uL   nRBC 0.0 0.0 - 0.2 %   Neutrophils Relative % 75 %   Neutro Abs 7.1 1.7 - 7.7 K/uL   Lymphocytes Relative 11 %   Lymphs Abs 1.0 0.7 - 4.0 K/uL   Monocytes Relative 11 %   Monocytes Absolute 1.0 0.1 - 1.0 K/uL   Eosinophils Relative 2 %   Eosinophils Absolute 0.2 0.0 - 0.5 K/uL   Basophils Relative 0 %   Basophils Absolute 0.0 0.0 - 0.1 K/uL   Immature Granulocytes 1 %   Abs Immature Granulocytes 0.10 (H) 0.00 - 0.07 K/uL    Comment: Performed at [facility], [place] [address] [place], [place] [address]  Comprehensive metabolic panel     Status: Abnormal   Collection Time: 09/28/22  1:54 PM  Result Value Ref Range   Sodium 129 (L) 135 - 145 mmol/L   Potassium 2.9 (L) 3.5 - 5.1 mmol/L   Chloride 95 (L) 98 - 111 mmol/L   CO2  20 (L) 22 - 32 mmol/L   Glucose, Bld 85 70 - 99 mg/dL    Comment: Glucose reference range applies only to samples taken after fasting for at least 8 hours.   BUN 7 6 - 20 mg/dL   Creatinine, Ser 0.39 (L) 0.61 - 1.24 mg/dL   Calcium 9.0 8.9 - 10.3 mg/dL   Total Protein 7.8 6.5 - 8.1 g/dL   Albumin 2.9 (L) 3.5 - 5.0 g/dL   AST 131 (H) 15 - 41 U/L   ALT 65 (H) 0 - 44 U/L   Alkaline Phosphatase 95 38 - 126  U/L   Total Bilirubin 17.5 (H) 0.3 - 1.2 mg/dL   GFR, Estimated >60 >60 mL/min    Comment: (NOTE) Calculated using the CKD-EPI Creatinine Equation (2021)    Anion gap 14 5 - 15    Comment: Performed at [facility], [place] [address] [place], [place] [address]  Lipase, blood     Status: None   Collection Time: 09/28/22  1:54 PM  Result Value Ref Range   Lipase 42 11 - 51 U/L    Comment: Performed at [facility], [place] [address], [place], [place] [address]  Magnesium     Status: Abnormal   Collection Time: 09/28/22  1:54 PM  Result Value Ref Range   Magnesium 1.2 (L) 1.7 - 2.4 mg/dL    Comment: Performed at [facility], [place] [address] [place], [place] [address]  Ammonia     Status: Abnormal   Collection Time: 09/28/22  1:56 PM  Result Value Ref Range   Ammonia 59 (H) 9 - 35 umol/L    Comment: Performed at [facility], [place] [address], [place], [place] [address]  Ethanol     Status: Abnormal   Collection Time: 09/28/22  1:56 PM  Result Value Ref Range   Alcohol, Ethyl (B) 11 (H) <10 mg/dL    Comment: (NOTE) Lowest detectable limit for serum alcohol is 10 mg/dL.  For medical purposes only. Performed at [facility], [place] [address] [place], [place] [address]   Lactic acid, plasma     Status: Abnormal   Collection Time: 09/28/22  1:56 PM  Result Value Ref Range   Lactic Acid, Venous 2.1 (HH) 0.5 - 1.9 mmol/L    Comment: CRITICAL RESULT CALLED TO, READ BACK BY AND VERIFIED WITH IJearld Adjutant, RN 09/28/22 1555 J. COLE Performed at [facility], [place] [address] [place], [place] [address]    No results found.  Pending Labs Unresulted Labs (From admission, onward)     Start     Ordered   10/05/22 0500  Creatinine, serum  (enoxaparin (LOVENOX)    CrCl >/= 30 ml/min)  Weekly,   R     Comments: while on enoxaparin therapy    09/28/22 2037   09/29/22 0500  Comprehensive metabolic panel  Daily,   R      09/28/22 2037   09/29/22 0500  Magnesium  Daily,   R      09/28/22 2037   09/29/22 0500  Phosphorus  Tomorrow morning,   R        09/28/22 2037   09/29/22 0500  CBC  Daily,   R      09/28/22 2037   09/29/22 0500  Protime-INR  Daily,   R      09/28/22 2037            Vitals/Pain Today's Vitals   09/28/22 2040 09/28/22 2045 09/28/22 2056 09/28/22 2100  BP:  (!) 99/48  99/63  Pulse: 93 87  97  Resp: 16 11  (!) 30  Temp:      TempSrc:      SpO2: 100% 99%  100%  PainSc:   8      Isolation Precautions No active isolations  Medications Medications  lactulose (CHRONULAC) 10 GM/15ML solution 30 g (has no administration in time range)  pantoprazole (PROTONIX) EC tablet 40 mg (has no administration in time range)  enoxaparin (LOVENOX) injection 40 mg (has no administration  in time range)  0.9 % NaCl with KCl 20 mEq/ L  infusion (has no administration in time range)  magnesium sulfate IVPB 2 g 50 mL (2 g Intravenous New Bag/Given 09/28/22 2054)  acetaminophen (TYLENOL) tablet 650 mg (has no administration in time range)    Or  acetaminophen (TYLENOL) suppository 650 mg (has no administration in time range)  senna-docusate (Senokot-S) tablet 1 tablet (has no administration in time range)  prednisoLONE tablet 40 mg (has no administration in time range)  LORazepam (ATIVAN) tablet 1-4 mg (has no administration in time range)    Or  LORazepam (ATIVAN) injection 1-4 mg (has no administration in time range)  thiamine (VITAMIN B1) tablet 100 mg  (has no administration in time range)    Or  thiamine (VITAMIN B1) injection 100 mg (has no administration in time range)  folic acid (FOLVITE) tablet 1 mg (has no administration in time range)  multivitamin with minerals tablet 1 tablet (has no administration in time range)  LORazepam (ATIVAN) injection 0-4 mg (has no administration in time range)    Followed by  LORazepam (ATIVAN) injection 0-4 mg (has no administration in time range)  potassium chloride SA (KLOR-CON M) CR tablet 40 mEq (40 mEq Oral Given 09/28/22 2053)    Mobility walks High fall risk   Focused Assessments     R Recommendations: See Admitting Provider Note  Report given to:   Additional Notes:

## 2022-09-28 NOTE — ED Notes (Signed)
Pt acuity increased based on lab results and ESI standards.

## 2022-09-28 NOTE — H&P (Signed)
History and Physical    Richard [name] ZJI:[id number] DOB: 1966-06-26 DOA: 09/28/2022  PCP: Charlott Rakes, MD   Patient coming from: Home   Chief Complaint: Fatigue, loss of appetite   HPI: Richard [name] is a 56 y.o. 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  Patient was admitted to the hospital on 09/24/2022 with acute alcoholic hepatitis, was seen by GI and started on prednisolone, but left AMA on 09/25/2022.  Since leaving the hospital, the patient reports continued nausea, loss of appetite, and fatigue, but denies vomiting, diarrhea, or fevers.  He has been drinking alcohol again since the recent admission.  He reports history of alcohol withdrawal.  ED Course: Upon arrival to the ED, patient is found to be afebrile and saturating well on room air with stable blood pressure.  Blood work is notable for sodium 129, potassium 2.9, magnesium 1.2, albumin 2.9, AST 131, ALT 65, total bilirubin 17.5, ammonia 59, hemoglobin 8.1, and platelets 129,000.  Review of Systems:  All other systems reviewed and apart from HPI, are negative.  Past Medical History:  Diagnosis Date   Abnormal nuclear cardiac imaging test    Acid reflux    Acute pancreatitis 08/14/2018   Alcohol withdrawal delirium ([place]) 17/51/0258   Alcoholic cardiomyopathy ([place]) 02/01/7781   Alcoholic hepatitis    Alcoholic ketoacidosis 01/24/5360   Ascites 12/13/2019   Aspiration pneumonia ([place]) 08/20/2016   Chest pain of uncertain etiology    Chronic systolic CHF (congestive heart failure) ([place])    Cirrhosis ([place])    Colon cancer ([place])    DCM (dilated cardiomyopathy) ([place])    Drug abuse ([place]) 01/07/2020   Elevated troponin 06/27/2018    ETOH abuse    Gastropathy 08/14/2018   Heme positive stool 11/13/2017   Hepatic steatosis 08/21/2016   High anion gap metabolic acidosis 01/04/3153   History of colon cancer    HTN (hypertension)    Hypertensive urgency 06/27/2018   Hypoglycemia 06/27/2018   Hypokalemia 01/07/2020   Hypomagnesemia    Hypophosphatemia    Lactic acidosis 08/20/2016   Leukocytosis 01/07/2020   Pancreatitis 08/2018   Polyp of ascending colon    Prolonged Q-T interval on ECG    Prolonged QT interval    Protein-calorie malnutrition, severe 05/02/2018   PUD (peptic ulcer disease)    SBP (spontaneous bacterial peritonitis) ([place]) 01/07/2020   Sepsis ([place]) 08/20/2016   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   SVT (supraventricular tachycardia)    Thrombocytopenia ([place]) 08/21/2016    Past Surgical History:  Procedure Laterality Date   BIOPSY  12/14/2019   Procedure: BIOPSY;  Surgeon: Mauri Pole, MD;  Location: [facility];  Service: Endoscopy;;   catherization  2007   COLONOSCOPY WITH PROPOFOL N/A 12/14/2019   Procedure: COLONOSCOPY WITH PROPOFOL;  Surgeon: Mauri Pole, MD;  Location: [facility];  Service: Endoscopy;  Laterality: N/A;   ESOPHAGOGASTRODUODENOSCOPY (EGD) WITH PROPOFOL N/A 12/14/2019   Procedure: ESOPHAGOGASTRODUODENOSCOPY (EGD) WITH PROPOFOL;  Surgeon: Mauri Pole, MD;  Location: [facility];  Service: Endoscopy;  Laterality: N/A;   HERNIA REPAIR  1969   1 x at birth and at 56 years old   [place]  2007   OPEN REDUCTION INTERNAL FIXATION (ORIF) HAND Right 2012   3rd  digit   POLYPECTOMY  12/14/2019   Procedure: POLYPECTOMY;  Surgeon: Mauri Pole, MD;  Location: [facility];  Service: Endoscopy;;    Social History:   reports that he has been smoking cigarettes. He has been smoking an average of 1 pack per day. He has never used smokeless tobacco. He reports that he does not currently use alcohol after a past usage of about 2.0 standard drinks of  alcohol per week. He reports current drug use. Drug: Marijuana.  Allergies  Allergen Reactions   Aspirin Other (See Comments)    Acid reflux    Aspirin Other (See Comments)    Caused acid reflux   Penicillins Hives    Has patient had a PCN reaction causing immediate rash, facial/tongue/throat swelling, SOB or lightheadedness with hypotension: yes Has patient had a PCN reaction causing severe rash involving mucus membranes or skin necrosis: no Has patient had a PCN reaction that required hospitalization: no Has patient had a PCN reaction occurring within the last 10 years: no If all of the above answers are "NO", then may proceed with Cephalosporin use.    Penicillins Hives    Family History  Problem Relation Age of Onset   Hypertension Mother    Colon cancer Father    Cancer Sister        type unknown   Kidney disease Sister        dialysis   Colon cancer Cousin        x 2   CAD Neg Hx    Stroke Neg Hx    Diabetes Neg Hx    Stomach cancer Neg Hx    Esophageal cancer Neg Hx    Pancreatic cancer Neg Hx    Colon polyps Neg Hx      Prior to Admission medications   Medication Sig Start Date End Date Taking? Authorizing Provider  aspirin 81 MG EC tablet Take 1 tablet (81 mg total) by mouth daily. Patient not taking: Reported on 05/31/2022 06/16/20   Fulp, Ander Gaster, MD  baclofen (LIORESAL) 10 MG tablet Take 1 tablet (10 mg total) by mouth 3 (three) times daily. Patient not taking: Reported on 05/31/2022 02/08/22   Charlott Rakes, MD  Cholecalciferol (VITAMIN D-3) 125 MCG (5000 UT) TABS Take 2 PO qd x 3 months, then 1 PO qd long-term after that Patient not taking: Reported on 05/31/2022 11/11/20   Hilts, Legrand Como, MD  DULoxetine (CYMBALTA) 60 MG capsule Take 1 capsule (60 mg total) by mouth daily. Patient not taking: Reported on 05/31/2022 11/09/21 11/09/22  Charlott Rakes, MD  ferrous gluconate (FERGON) 324 MG tablet Take 1 tablet (324 mg total) by mouth daily with breakfast. Patient not  taking: Reported on 05/31/2022 11/11/20   Hilts, Legrand Como, MD  furosemide (LASIX) 20 MG tablet TAKE 1 TABLET(20 MG) BY MOUTH DAILY 08/09/22   Charlott Rakes, MD  gabapentin (NEURONTIN) 300 MG capsule TAKE 2 CAPSULES(600 MG) BY MOUTH THREE TIMES DAILY 09/13/22   Charlott Rakes, MD  Hyoscyamine Sulfate SL 0.125 MG SUBL Take 1 tablet ( 0.125 mg)  by mouth 2 (two) times daily. Patient not taking: Reported on 05/31/2022 12/17/21 12/17/22  Zehr, Laban Emperor, PA-C  lactulose (CHRONULAC) 10 GM/15ML solution TAKE 30 ML BY MOUTH TWICE DAILY Patient not taking: Reported on 09/24/2022 08/30/22   Charlott Rakes, MD  magnesium oxide (MAG-OX) 400 (241.3 Mg) MG tablet Take 1 tablet (400 mg total) by mouth daily. Patient not taking: Reported on 05/31/2022 06/16/20   Antony Blackbird, MD  midodrine (PROAMATINE) 5 MG tablet Take 1 tablet (5 mg total) by mouth 3 (three) times daily with meals. Patient not taking: Reported on 05/31/2022 11/05/21   Kathie Dike, MD  naloxone [facility]) nasal spray 4 mg/0.1 mL Place 1 spray into the nose as needed (accidental overdose). 07/06/22   [provider]  oxyCODONE-acetaminophen (PERCOCET) 10-325 MG tablet Take 1 tablet by mouth 4 (four) times daily as needed for pain. 08/26/22   [provider]  pantoprazole (PROTONIX) 40 MG tablet TAKE 1 TABLET(40 MG) BY MOUTH DAILY 08/09/22   Charlott Rakes, MD  spironolactone (ALDACTONE) 100 MG tablet Take 1 tablet (100 mg total) by mouth daily. Patient not taking: Reported on 05/31/2022 11/05/21 02/03/22  Kathie Dike, MD  thiamine 100 MG tablet Take 1 tablet (100 mg total) by mouth daily. Patient not taking: Reported on 05/31/2022 11/06/21   Kathie Dike, MD    Physical Exam: Vitals:   09/28/22 1317 09/28/22 1952  BP: (!) 150/79 124/76  Pulse: 74 (!) 145  Resp: 14 20  Temp: 97.9 F (36.6 C) 97.8 F (36.6 C)  TempSrc: Oral Oral  SpO2: 100% 100%    Constitutional: NAD, cachectic   Eyes: PERTLA, lids and conjunctivae  normal ENMT: Mucous membranes are moist. Posterior pharynx clear of any exudate or lesions.   Neck: supple, no masses  Respiratory: no wheezing, no crackles. No accessory muscle use.  Cardiovascular: S1 & S2 heard, regular rate and rhythm. No extremity edema.   Abdomen: No distension, soft, tender in RUQ without rebound pain or guarding. Bowel sounds active.  Musculoskeletal: no clubbing / cyanosis. No joint deformity upper and lower extremities.   Skin: no significant rashes, lesions, ulcers. Warm, dry, well-perfused. Neurologic: CN 2-12 grossly intact. Moving all extremities. Asterixis present. Alert and oriented.  Psychiatric: Calm. Cooperative.    Labs and Imaging on Admission: I have personally reviewed following labs and imaging studies  CBC: Recent Labs  Lab 09/23/22 1751 09/23/22 1854 09/24/22 0608 09/24/22 0821 09/25/22 0515 09/28/22 1354  WBC 8.6  --  6.7  --  7.4 9.5  NEUTROABS 6.6  --  4.9  --   --  7.1  HGB 8.9* 11.2* 8.7* 8.3* 7.9* 8.1*  HCT 25.6* 33.0* 24.9* 24.4* 23.3* 24.4*  MCV 80.5  --  79.6*  --  80.1 85.3  PLT 107*  --  92*  --  105* 629*   Basic Metabolic Panel: Recent Labs  Lab 09/23/22 1751 09/23/22 1854 09/24/22 0608 09/24/22 0821 09/24/22 1533 09/25/22 0515 09/28/22 1354  NA 125*   < > 126* 125* 125* 126* 129*  K 2.6*   < > 2.5* 2.5* 3.5 3.0* 2.9*  CL 71*   < > 79* 78* 80* 87* 95*  CO2 28  --  32 33* 32 26 20*  GLUCOSE 89   < > 108* 129* 122* 91 85  BUN 9   < > 6 5* 5* <5* 7  CREATININE 0.71   < > 0.52* 0.51* 0.57* 0.46* 0.39*  CALCIUM 8.0*  --  8.5* 8.4* 8.6* 8.9 9.0  MG 1.4*  --  2.1 1.9  --   --  1.2*  PHOS 1.7*  --  <1.0* <1.0*  --  <1.0*  --    < > = values in this interval not displayed.   GFR: Estimated Creatinine Clearance: 74.5 mL/min (A) (by C-G formula based on SCr of 0.39 mg/dL (L)). Liver Function Tests: Recent Labs  Lab 09/23/22 1751 09/24/22 5284  09/24/22 0821 09/25/22 0515 09/28/22 1354  AST 180* 135* 130* 137*  131*  ALT 61* 51* 50* 53* 65*  ALKPHOS 107 93 85 100 95  BILITOT 13.9* 13.3* 13.1* 12.9* 17.5*  PROT 8.3* 7.5 7.1 7.4 7.8  ALBUMIN 3.0* 2.8* 2.6* 2.6* 2.9*   Recent Labs  Lab 09/23/22 1751 09/28/22 1354  LIPASE 73* 42   Recent Labs  Lab 09/23/22 2326 09/28/22 1356  AMMONIA 50* 59*   Coagulation Profile: Recent Labs  Lab 09/23/22 1751 09/24/22 0821 09/25/22 0515  INR 2.4* 1.7* 1.6*   Cardiac Enzymes: No results for input(s): "CKTOTAL", "CKMB", "CKMBINDEX", "TROPONINI" in the last 168 hours. BNP (last 3 results) No results for input(s): "PROBNP" in the last 8760 hours. HbA1C: No results for input(s): "HGBA1C" in the last 72 hours. CBG: Recent Labs  Lab 09/23/22 1351  GLUCAP 70   Lipid Profile: No results for input(s): "CHOL", "HDL", "LDLCALC", "TRIG", "CHOLHDL", "LDLDIRECT" in the last 72 hours. Thyroid Function Tests: No results for input(s): "TSH", "T4TOTAL", "FREET4", "T3FREE", "THYROIDAB" in the last 72 hours. Anemia Panel: No results for input(s): "VITAMINB12", "FOLATE", "FERRITIN", "TIBC", "IRON", "RETICCTPCT" in the last 72 hours. Urine analysis:    Component Value Date/Time   COLORURINE AMBER (A) 09/23/2022 1421   APPEARANCEUR CLEAR 09/23/2022 1421   LABSPEC 1.017 09/23/2022 1421   PHURINE 5.0 09/23/2022 1421   GLUCOSEU NEGATIVE 09/23/2022 1421   HGBUR NEGATIVE 09/23/2022 1421   BILIRUBINUR SMALL (A) 09/23/2022 1421   KETONESUR 80 (A) 09/23/2022 1421   PROTEINUR 30 (A) 09/23/2022 1421   UROBILINOGEN 0.2 01/13/2009 1011   NITRITE NEGATIVE 09/23/2022 1421   LEUKOCYTESUR NEGATIVE 09/23/2022 1421   Sepsis Labs: '@LABRCNTIP'$ (procalcitonin:4,lacticidven:4) ) Recent Results (from the past 240 hour(s))  Urine Culture     Status: Abnormal   Collection Time: 09/23/22  1:46 AM   Specimen: Urine, Clean Catch  Result Value Ref Range Status   Specimen Description   Final    URINE, CLEAN CATCH Performed at [facility], [place] [address], [place], [place] [address]    Special Requests   Final    NONE Performed at [facility], [place] [address] [place], [place] [address]    Culture (A)  Final    <10,000 COLONIES/mL INSIGNIFICANT GROWTH Performed at [place] [address] [place], [place] [address]    Report Status 09/25/2022 FINAL  Final  Resp panel by RT-PCR (RSV, Flu A&B, Covid) Anterior Nasal Swab     Status: None   Collection Time: 09/23/22  5:51 PM   Specimen: Anterior Nasal Swab  Result Value Ref Range Status   SARS Coronavirus 2 by RT PCR NEGATIVE NEGATIVE Final    Comment: (NOTE) SARS-CoV-2 target nucleic acids are NOT DETECTED.  The SARS-CoV-2 RNA is generally detectable in upper respiratory specimens during the acute phase of infection. The lowest concentration of SARS-CoV-2 viral copies this assay can detect is 138 copies/mL. A negative result does not preclude SARS-Cov-2 infection and should not be used as the sole basis for treatment or other patient management decisions. A negative result may occur with  improper specimen collection/handling, submission of specimen other than nasopharyngeal swab, presence of viral mutation(s) within the areas targeted by this assay, and inadequate number of viral copies(<138 copies/mL). A negative result must be combined with clinical observations, patient history, and epidemiological information. The expected result is Negative.  Fact Sheet for Patients:  EntrepreneurPulse.com.au  Fact Sheet for Healthcare Providers:  IncredibleEmployment.be  This test is no t yet approved or cleared by the [place] and  has been authorized for detection and/or diagnosis of SARS-CoV-2 by FDA under an Emergency Use Authorization (EUA). This EUA will remain  in effect (meaning this test can be used) for the duration of the COVID-19 declaration under Section 564(b)(1) of the Act, 21 U.S.C.section  360bbb-3(b)(1), unless the authorization is terminated  or revoked sooner.       Influenza A by PCR NEGATIVE NEGATIVE Final   Influenza B by PCR NEGATIVE NEGATIVE Final    Comment: (NOTE) The Xpert Xpress SARS-CoV-2/FLU/RSV plus assay is intended as an aid in the diagnosis of influenza from Nasopharyngeal swab specimens and should not be used as a sole basis for treatment. Nasal washings and aspirates are unacceptable for Xpert Xpress SARS-CoV-2/FLU/RSV testing.  Fact Sheet for Patients: EntrepreneurPulse.com.au  Fact Sheet for Healthcare Providers: IncredibleEmployment.be  This test is not yet approved or cleared by the [place] FDA and has been authorized for detection and/or diagnosis of SARS-CoV-2 by FDA under an Emergency Use Authorization (EUA). This EUA will remain in effect (meaning this test can be used) for the duration of the COVID-19 declaration under Section 564(b)(1) of the Act, 21 U.S.C. section 360bbb-3(b)(1), unless the authorization is terminated or revoked.     Resp Syncytial Virus by PCR NEGATIVE NEGATIVE Final    Comment: (NOTE) Fact Sheet for Patients: EntrepreneurPulse.com.au  Fact Sheet for Healthcare Providers: IncredibleEmployment.be  This test is not yet approved or cleared by the [place] FDA and has been authorized for detection and/or diagnosis of SARS-CoV-2 by FDA under an Emergency Use Authorization (EUA). This EUA will remain in effect (meaning this test can be used) for the duration of the COVID-19 declaration under Section 564(b)(1) of the Act, 21 U.S.C. section 360bbb-3(b)(1), unless the authorization is terminated or revoked.  Performed at [facility], [place] [address] [place], [place] [address]   Culture, blood (routine x 2)     Status: None (Preliminary result)   Collection Time: 09/23/22 11:26 PM   Specimen: BLOOD  Result Value  Ref Range Status   Specimen Description   Final    BLOOD RIGHT ANTECUBITAL Performed at [facility] [address] [place], [place] [address]    Special Requests   Final    BOTTLES DRAWN AEROBIC AND ANAEROBIC Blood Culture results may not be optimal due to an excessive volume of blood received in culture bottles Performed at [facility] [address], [place], [place] [address]    Culture   Final    NO GROWTH 4 DAYS Performed at [facility], [place] [address] [place], [place] [address]    Report Status PENDING  Incomplete  Culture, blood (routine x 2)     Status: None (Preliminary result)   Collection Time: 09/24/22  6:08 AM   Specimen: BLOOD LEFT ARM  Result Value Ref Range Status   Specimen Description   Final    BLOOD LEFT ARM Performed at [facility], [place] [address], [place], [place] [address]    Special Requests   Final    BOTTLES DRAWN AEROBIC ONLY Blood Culture adequate volume Performed at [place] [address] [place], [place] [address]    Culture   Final    NO GROWTH 4 DAYS Performed at [facility], [place] [address], [place], [place] [address]    Report Status PENDING  Incomplete     Radiological Exams on Admission: No results found.  Assessment/Plan   1. Acute alcoholic hepatitis; hepatic encephalopathy; EtOH cirrhosis - No infectious s/s   - Resume prednisolone 40 mg qD, resume lactulose, hold diuretics while hydrating, correct electrolytes, encourage strict alcohol avoidance    2. Hyponatremia; hypokalemia; hypomagnesemia  - Serum sodium is improved to 129 on admission, potassium is 2.9, and magnesium is 1.2 in setting of hypovolemia and alcoholism with malnutrition  - Start isotonic IVF hydration, replace potassium and magnesium, repeat labs in am    DVT prophylaxis: Lovenox Code Status: Full  Level of Care: Level of care: Telemetry Family Communication: None present    Disposition Plan:  Patient is from: home  Anticipated d/c is to: TBD Anticipated d/c date is: 10/01/22  Patient currently: pending correction of electrolyte disturbances, treatment of hepatic encephalopathy  Consults called: none Admission status: Inpatient    Vianne Bulls, MD [facility]  09/28/2022, 8:39 PM

## 2022-09-28 NOTE — ED Provider Triage Note (Signed)
Emergency Medicine Provider Triage Evaluation Note  Richard [name] , a 56 y.o. male  was evaluated in triage.  Pt complains of returning to the hospital after leaving AMA from his admission. Patient recently admitted for acute hepatitis with increased INR and lactic acidosis.  Patient has a history of alcohol abuse with cirrhosis.  Patient states he last drink alcohol on Christmas however is unable to tell me how much.  Mother at that bedside state patient has been slightly confused since leaving the hospital in 12/24. Patient lives with cousin. Mother states patient has had decreased po intake.  Review of Systems  Positive: Decrease po intake Negative: fever  Physical Exam  BP (!) 150/79 (BP Location: Left Arm)   Pulse 74   Temp 97.9 F (36.6 C) (Oral)   Resp 14   SpO2 100%  Gen:   Awake, no distress   Resp:  Normal effort  MSK:   Moves extremities without difficulty  Other:  Nonfocal neurological exam  Medical Decision Making  Medically screening exam initiated at 1:25 PM.  Appropriate orders placed.  Deddrick Saindon Campi was informed that the remainder of the evaluation will be completed by another provider, this initial triage assessment does not replace that evaluation, and the importance of remaining in the ED until their evaluation is complete.  Routine labs Patient able to answer full name, place, and year in triage.    [name], [place] 09/28/22 1329

## 2022-09-28 NOTE — Telephone Encounter (Signed)
Transition Care Management Unsuccessful Follow-up Telephone Call  Date of discharge and from where:  09/25/2022, [facility] - left AMA   Attempts:  2nd Attempt  Reason for unsuccessful TCM follow-up call:  Unable to reach patient - [phone], the recording stated that the call cannot be completed at this time .

## 2022-09-28 NOTE — ED Provider Notes (Signed)
[facility] Provider Note   CSN: [id number] Arrival date & time: 09/28/22  1310     History  Chief Complaint  Patient presents with   Weakness    Richard [name] is a 56 y.o. male.  56 yo M with a chief complaints of weakness.  The patient states that he came to the hospital with similar and he left AGAINST MEDICAL ADVICE to celebrate Christmas with his family and felt like he needed to come back because he was still too weak.  He denies fevers denies abdominal pain denies cough or congestion.   Weakness      Home Medications Prior to Admission medications   Medication Sig Start Date End Date Taking? Authorizing Provider  aspirin 81 MG EC tablet Take 1 tablet (81 mg total) by mouth daily. Patient not taking: Reported on 05/31/2022 06/16/20   Fulp, Ander Gaster, MD  baclofen (LIORESAL) 10 MG tablet Take 1 tablet (10 mg total) by mouth 3 (three) times daily. Patient not taking: Reported on 05/31/2022 02/08/22   Charlott Rakes, MD  Cholecalciferol (VITAMIN D-3) 125 MCG (5000 UT) TABS Take 2 PO qd x 3 months, then 1 PO qd long-term after that Patient not taking: Reported on 05/31/2022 11/11/20   Hilts, Legrand Como, MD  DULoxetine (CYMBALTA) 60 MG capsule Take 1 capsule (60 mg total) by mouth daily. Patient not taking: Reported on 05/31/2022 11/09/21 11/09/22  Charlott Rakes, MD  ferrous gluconate (FERGON) 324 MG tablet Take 1 tablet (324 mg total) by mouth daily with breakfast. Patient not taking: Reported on 05/31/2022 11/11/20   Hilts, Legrand Como, MD  furosemide (LASIX) 20 MG tablet TAKE 1 TABLET(20 MG) BY MOUTH DAILY 08/09/22   Charlott Rakes, MD  gabapentin (NEURONTIN) 300 MG capsule TAKE 2 CAPSULES(600 MG) BY MOUTH THREE TIMES DAILY 09/13/22   Charlott Rakes, MD  Hyoscyamine Sulfate SL 0.125 MG SUBL Take 1 tablet ( 0.125 mg)  by mouth 2 (two) times daily. Patient not taking: Reported on 05/31/2022 12/17/21 12/17/22  Zehr, Laban Emperor, PA-C  lactulose (CHRONULAC) 10 GM/15ML solution  TAKE 30 ML BY MOUTH TWICE DAILY Patient not taking: Reported on 09/24/2022 08/30/22   Charlott Rakes, MD  magnesium oxide (MAG-OX) 400 (241.3 Mg) MG tablet Take 1 tablet (400 mg total) by mouth daily. Patient not taking: Reported on 05/31/2022 06/16/20   Fulp, Ander Gaster, MD  midodrine (PROAMATINE) 5 MG tablet Take 1 tablet (5 mg total) by mouth 3 (three) times daily with meals. Patient not taking: Reported on 05/31/2022 11/05/21   Kathie Dike, MD  naloxone [facility]) nasal spray 4 mg/0.1 mL Place 1 spray into the nose as needed (accidental overdose). 07/06/22   [provider]  oxyCODONE-acetaminophen (PERCOCET) 10-325 MG tablet Take 1 tablet by mouth 4 (four) times daily as needed for pain. 08/26/22   [provider]  pantoprazole (PROTONIX) 40 MG tablet TAKE 1 TABLET(40 MG) BY MOUTH DAILY 08/09/22   Charlott Rakes, MD  spironolactone (ALDACTONE) 100 MG tablet Take 1 tablet (100 mg total) by mouth daily. Patient not taking: Reported on 05/31/2022 11/05/21 02/03/22  Kathie Dike, MD  thiamine 100 MG tablet Take 1 tablet (100 mg total) by mouth daily. Patient not taking: Reported on 05/31/2022 11/06/21   Kathie Dike, MD      Allergies    Aspirin, Aspirin, Penicillins, and Penicillins    Review of Systems   Review of Systems  Neurological:  Positive for weakness.    Physical Exam Updated Vital Signs BP Marland Kitchen)  104/52 (BP Location: Right Arm)   Pulse 72   Temp 98.3 F (36.8 C) (Oral)   Resp 18   SpO2 100%  Physical Exam Vitals and nursing note reviewed.  Constitutional:      Appearance: He is well-developed.     Comments: Grossly yellow, chronically ill-appearing.  Cachectic.  HENT:     Head: Normocephalic and atraumatic.  Eyes:     Pupils: Pupils are equal, round, and reactive to light.  Neck:     Vascular: No JVD.  Cardiovascular:     Rate and Rhythm: Normal rate and regular rhythm.     Heart sounds: No murmur heard.    No friction rub. No gallop.  Pulmonary:      Effort: No respiratory distress.     Breath sounds: No wheezing.  Abdominal:     General: There is no distension.     Tenderness: There is no abdominal tenderness. There is no guarding or rebound.  Musculoskeletal:        General: Normal range of motion.     Cervical back: Normal range of motion and neck supple.  Skin:    Coloration: Skin is not pale.     Findings: No rash.  Neurological:     Mental Status: He is alert and oriented to person, place, and time.     Comments: asterixis   Psychiatric:        Behavior: Behavior normal.     ED Results / Procedures / Treatments   Labs (all labs ordered are listed, but only abnormal results are displayed) Labs Reviewed  CBC WITH DIFFERENTIAL/PLATELET - Abnormal; Notable for the following components:      Result Value   RBC 2.86 (*)    Hemoglobin 8.1 (*)    HCT 24.4 (*)    RDW 28.9 (*)    Platelets 129 (*)    Abs Immature Granulocytes 0.10 (*)    All other components within normal limits  COMPREHENSIVE METABOLIC PANEL - Abnormal; Notable for the following components:   Sodium 129 (*)    Potassium 2.9 (*)    Chloride 95 (*)    CO2 20 (*)    Creatinine, Ser 0.39 (*)    Albumin 2.9 (*)    AST 131 (*)    ALT 65 (*)    Total Bilirubin 17.5 (*)    All other components within normal limits  AMMONIA - Abnormal; Notable for the following components:   Ammonia 59 (*)    All other components within normal limits  MAGNESIUM - Abnormal; Notable for the following components:   Magnesium 1.2 (*)    All other components within normal limits  ETHANOL - Abnormal; Notable for the following components:   Alcohol, Ethyl (B) 11 (*)    All other components within normal limits  LACTIC ACID, PLASMA - Abnormal; Notable for the following components:   Lactic Acid, Venous 2.1 (*)    All other components within normal limits  LIPASE, BLOOD  COMPREHENSIVE METABOLIC PANEL  MAGNESIUM  PHOSPHORUS  CBC  PROTIME-INR    EKG None  Radiology No  results found.  Procedures Procedures    Medications Ordered in ED Medications  lactulose (CHRONULAC) 10 GM/15ML solution 30 g (has no administration in time range)  pantoprazole (PROTONIX) EC tablet 40 mg (40 mg Oral Not Given 09/28/22 2210)  enoxaparin (LOVENOX) injection 40 mg (has no administration in time range)  0.9 % NaCl with KCl 20 mEq/ L  infusion (has no  administration in time range)  acetaminophen (TYLENOL) tablet 650 mg (has no administration in time range)    Or  acetaminophen (TYLENOL) suppository 650 mg (has no administration in time range)  senna-docusate (Senokot-S) tablet 1 tablet (has no administration in time range)  prednisoLONE tablet 40 mg (40 mg Oral Not Given 09/28/22 2210)  LORazepam (ATIVAN) tablet 1-4 mg (has no administration in time range)    Or  LORazepam (ATIVAN) injection 1-4 mg (has no administration in time range)  thiamine (VITAMIN B1) tablet 100 mg (100 mg Oral Not Given 09/28/22 2221)    Or  thiamine (VITAMIN B1) injection 100 mg ( Intravenous See Alternative 24/26/83 4196)  folic acid (FOLVITE) tablet 1 mg (1 mg Oral Not Given 09/28/22 2210)  multivitamin with minerals tablet 1 tablet (1 tablet Oral Not Given 09/28/22 2210)  LORazepam (ATIVAN) injection 0-4 mg (has no administration in time range)    Followed by  LORazepam (ATIVAN) injection 0-4 mg (has no administration in time range)  potassium chloride SA (KLOR-CON M) CR tablet 40 mEq (40 mEq Oral Given 09/28/22 2053)  magnesium sulfate IVPB 2 g 50 mL (0 g Intravenous Stopped 09/28/22 2149)    ED Course/ Medical Decision Making/ A&P                           Medical Decision Making Risk Decision regarding hospitalization.   56 yo M with a chief complaints of not feeling well.  Patient is chronically unwell, history of alcoholic cirrhosis.  I reviewed the patient's medical records.  He has had some improvement of his lab work from last check but still has diffuse LFT elevation with a  total bilirubin of greater than 17, chronic anemia, elevated ammonia level with asterixis on exam.   Discussed with medicine for admission.   The patients results and plan were reviewed and discussed.   Any x-rays performed were independently reviewed by myself.   Differential diagnosis were considered with the presenting HPI.  Medications  lactulose (CHRONULAC) 10 GM/15ML solution 30 g (has no administration in time range)  pantoprazole (PROTONIX) EC tablet 40 mg (40 mg Oral Not Given 09/28/22 2210)  enoxaparin (LOVENOX) injection 40 mg (has no administration in time range)  0.9 % NaCl with KCl 20 mEq/ L  infusion (has no administration in time range)  acetaminophen (TYLENOL) tablet 650 mg (has no administration in time range)    Or  acetaminophen (TYLENOL) suppository 650 mg (has no administration in time range)  senna-docusate (Senokot-S) tablet 1 tablet (has no administration in time range)  prednisoLONE tablet 40 mg (40 mg Oral Not Given 09/28/22 2210)  LORazepam (ATIVAN) tablet 1-4 mg (has no administration in time range)    Or  LORazepam (ATIVAN) injection 1-4 mg (has no administration in time range)  thiamine (VITAMIN B1) tablet 100 mg (100 mg Oral Not Given 09/28/22 2221)    Or  thiamine (VITAMIN B1) injection 100 mg ( Intravenous See Alternative 22/29/79 8921)  folic acid (FOLVITE) tablet 1 mg (1 mg Oral Not Given 09/28/22 2210)  multivitamin with minerals tablet 1 tablet (1 tablet Oral Not Given 09/28/22 2210)  LORazepam (ATIVAN) injection 0-4 mg (has no administration in time range)    Followed by  LORazepam (ATIVAN) injection 0-4 mg (has no administration in time range)  potassium chloride SA (KLOR-CON M) CR tablet 40 mEq (40 mEq Oral Given 09/28/22 2053)  magnesium sulfate IVPB 2 g 50 mL (0  g Intravenous Stopped 09/28/22 2149)    Vitals:   09/28/22 2100 09/28/22 2115 09/28/22 2145 09/28/22 2215  BP: 99/63 106/69 104/64 (!) 104/52  Pulse: 97 91 87 72  Resp: (!) '30  15 16 18  '$ Temp:    98.3 F (36.8 C)  TempSrc:    Oral  SpO2: 100% 100% 100% 100%    Final diagnoses:  Alcoholic cirrhosis of liver with ascites ([place])    Admission/ observation were discussed with the admitting physician, patient and/or family and they are comfortable with the plan.          Final Clinical Impression(s) / ED Diagnoses Final diagnoses:  Alcoholic cirrhosis of liver with ascites [facility])    Rx / DC Orders ED Discharge Orders     None         Deno Etienne, DO 09/28/22 2238

## 2022-09-29 DIAGNOSIS — K7682 Hepatic encephalopathy: Secondary | ICD-10-CM

## 2022-09-29 DIAGNOSIS — K7031 Alcoholic cirrhosis of liver with ascites: Secondary | ICD-10-CM | POA: Diagnosis not present

## 2022-09-29 DIAGNOSIS — K7011 Alcoholic hepatitis with ascites: Secondary | ICD-10-CM

## 2022-09-29 LAB — CBC
HCT: 22.7 % — ABNORMAL LOW (ref 39.0–52.0)
Hemoglobin: 7.4 g/dL — ABNORMAL LOW (ref 13.0–17.0)
MCH: 28.2 pg (ref 26.0–34.0)
MCHC: 32.6 g/dL (ref 30.0–36.0)
MCV: 86.6 fL (ref 80.0–100.0)
Platelets: 138 10*3/uL — ABNORMAL LOW (ref 150–400)
RBC: 2.62 MIL/uL — ABNORMAL LOW (ref 4.22–5.81)
RDW: 30.2 % — ABNORMAL HIGH (ref 11.5–15.5)
WBC: 7.3 10*3/uL (ref 4.0–10.5)
nRBC: 0 % (ref 0.0–0.2)

## 2022-09-29 LAB — CULTURE, BLOOD (ROUTINE X 2)
Culture: NO GROWTH
Culture: NO GROWTH
Special Requests: ADEQUATE

## 2022-09-29 LAB — COMPREHENSIVE METABOLIC PANEL
ALT: 52 U/L — ABNORMAL HIGH (ref 0–44)
AST: 97 U/L — ABNORMAL HIGH (ref 15–41)
Albumin: 2.3 g/dL — ABNORMAL LOW (ref 3.5–5.0)
Alkaline Phosphatase: 76 U/L (ref 38–126)
Anion gap: 9 (ref 5–15)
BUN: 6 mg/dL (ref 6–20)
CO2: 20 mmol/L — ABNORMAL LOW (ref 22–32)
Calcium: 8.3 mg/dL — ABNORMAL LOW (ref 8.9–10.3)
Chloride: 101 mmol/L (ref 98–111)
Creatinine, Ser: 0.49 mg/dL — ABNORMAL LOW (ref 0.61–1.24)
GFR, Estimated: >60 mL/min (ref >60)
Glucose, Bld: 91 mg/dL (ref 70–99)
Potassium: 3.1 mmol/L — ABNORMAL LOW (ref 3.5–5.1)
Sodium: 130 mmol/L — ABNORMAL LOW (ref 135–145)
Total Bilirubin: 15 mg/dL — ABNORMAL HIGH (ref 0.3–1.2)
Total Protein: 6.3 g/dL — ABNORMAL LOW (ref 6.5–8.1)

## 2022-09-29 LAB — PHOSPHORUS: Phosphorus: 1.5 mg/dL — ABNORMAL LOW (ref 2.5–4.6)

## 2022-09-29 LAB — MAGNESIUM: Magnesium: 1.8 mg/dL (ref 1.7–2.4)

## 2022-09-29 LAB — PROTIME-INR
INR: 1.8 — ABNORMAL HIGH (ref 0.8–1.2)
Prothrombin Time: 20.3 seconds — ABNORMAL HIGH (ref 11.4–15.2)

## 2022-09-29 MED ORDER — VITAMIN K1 10 MG/ML IJ SOLN
10.0000 mg | Freq: Once | INTRAVENOUS | Status: AC
  Administered 2022-09-29: 10 mg via INTRAVENOUS
  Filled 2022-09-29: qty 1

## 2022-09-29 MED ORDER — POTASSIUM CHLORIDE CRYS ER 20 MEQ PO TBCR
40.0000 meq | EXTENDED_RELEASE_TABLET | Freq: Once | ORAL | Status: AC
  Administered 2022-09-29: 40 meq via ORAL
  Filled 2022-09-29: qty 2

## 2022-09-29 MED ORDER — POTASSIUM PHOSPHATES 15 MMOLE/5ML IV SOLN
30.0000 mmol | Freq: Once | INTRAVENOUS | Status: AC
  Administered 2022-09-29: 30 mmol via INTRAVENOUS
  Filled 2022-09-29: qty 10

## 2022-09-29 NOTE — Progress Notes (Addendum)
[facility] PROGRESS NOTE   Richard [name] DDU:[id number] DOB: 22-Dec-1965 DOA: 09/28/2022  PCP: Charlott Rakes, MD  Brief History/Interval Summary: 56 y.o. 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 Patient was admitted to the hospital on 09/24/2022 with acute alcoholic hepatitis, was seen by GI and started on prednisolone, but left AMA on 09/25/2022.    Consultants: None  Procedures: None    Subjective/Interval History: Patient still somewhat lethargic but easily arousable.  Confused.  Does not appear to be in any discomfort.     Assessment/Plan:  Acute alcoholic hepatitis/hepatic encephalopathy/liver cirrhosis Patient left AGAINST MEDICAL ADVICE on 12/24.  He was seen by gastroenterology during that hospitalization and was started on prednisolone. Alpha-fetoprotein level is normal. Will reinitiate prednisolone per GI recommendations. Ammonia level was noted to be elevated.  Started on lactulose.  He does have asterixis on examination.  Will recheck ammonia level in the morning. Patient with's significant hyperbilirubinemia with a bilirubin of 15.  AST and ALT are noted to be abnormal as well.  Coagulopathy Secondary to liver disease.  INR is 1.8.  Will be given vitamin K.  Hyponatremia/hypokalemia/hypophosphatemia/hypomagnesemia Correct electrolytes.  Sodium level is stable.  Normocytic anemia/thrombocytopenia No evidence for overt bleeding.  Monitor hemoglobin.  Platelet counts are stable.  Ascites Diuretics on hold currently.  Continue to monitor.  Previous history of colon cancer status post colectomy Stable.  Cholelithiasis Seems to be asymptomatic.   DVT  Prophylaxis: auto anticoagulated Code Status: Full code Family Communication: Will update patient's mother later today Disposition Plan: To be determined  Status is: Inpatient Remains inpatient appropriate because: Alcoholic hepatitis, hepatic encephalopathy    Medications: Scheduled:  enoxaparin (LOVENOX) injection  40 mg Subcutaneous C37S   folic acid  1 mg Oral Daily   lactulose  30 g Oral BID   LORazepam  0-4 mg Intravenous Q6H   Followed by   Derrill Memo ON 09/30/2022] LORazepam  0-4 mg Intravenous Q12H   multivitamin with minerals  1 tablet Oral Daily   pantoprazole  40 mg Oral Daily   potassium chloride  40 mEq Oral Once   prednisoLONE  40 mg Oral Daily   thiamine  100 mg Oral Daily   Or   thiamine  100 mg Intravenous Daily   Continuous:  0.9 % NaCl with KCl 20 mEq / L 100 mL/hr at 09/29/22 0610   potassium PHOSPHATE IVPB (in mmol) 30 mmol (09/29/22 1126)   EGB:[id number] **OR** acetaminophen, LORazepam **OR** LORazepam, senna-docusate  Antibiotics: Anti-infectives (From admission, onward)    None       Objective:  Vital Signs  Vitals:   09/28/22 2145 09/28/22 2215 09/29/22 0639 09/29/22 1000  BP: 104/64 (!) 104/52 129/70 115/65  Pulse: 87 72 62 65  Resp: '16 18 18 20  '$ Temp:  98.3 F (36.8 C) 97.7 F (36.5 C) 97.8 F (36.6 C)  TempSrc:  Oral Oral Oral  SpO2: 100% 100% 100% 99%    Intake/Output Summary (Last 24 hours) at 09/29/2022 1154 Last data filed at 09/29/2022 1020 Gross per 24 hour  Intake 120 ml  Output --  Net 120 ml   There were no vitals filed for this visit.  General appearance: Lethargic but easily arousable.  In no distress Resp: Clear to  auscultation bilaterally.  Normal effort Cardio: S1-S2 is normal regular.  No S3-S4.  No rubs murmurs or bruit GI: Abdomen is soft.  Nontender nondistended.  Bowel sounds are present normal.  No masses organomegaly Extremities: No edema.  Physical deconditioning noted. Neurologic: Disoriented.   Asterixis noted.  No obvious focal neurological deficits noted.   Lab Results:  Data Reviewed: I have personally reviewed following labs and reports of the imaging studies  CBC: Recent Labs  Lab 09/23/22 1751 09/23/22 1854 09/24/22 0608 09/24/22 0821 09/25/22 0515 09/28/22 1354 09/29/22 0757  WBC 8.6  --  6.7  --  7.4 9.5 7.3  NEUTROABS 6.6  --  4.9  --   --  7.1  --   HGB 8.9*   < > 8.7* 8.3* 7.9* 8.1* 7.4*  HCT 25.6*   < > 24.9* 24.4* 23.3* 24.4* 22.7*  MCV 80.5  --  79.6*  --  80.1 85.3 86.6  PLT 107*  --  92*  --  105* 129* 138*   < > = values in this interval not displayed.    Basic Metabolic Panel: Recent Labs  Lab 09/23/22 1751 09/23/22 1854 09/24/22 0608 09/24/22 0821 09/24/22 1533 09/25/22 0515 09/28/22 1354 09/29/22 0757  NA 125*   < > 126* 125* 125* 126* 129* 130*  K 2.6*   < > 2.5* 2.5* 3.5 3.0* 2.9* 3.1*  CL 71*   < > 79* 78* 80* 87* 95* 101  CO2 28  --  32 33* 32 26 20* 20*  GLUCOSE 89   < > 108* 129* 122* 91 85 91  BUN 9   < > 6 5* 5* <5* 7 6  CREATININE 0.71   < > 0.52* 0.51* 0.57* 0.46* 0.39* 0.49*  CALCIUM 8.0*  --  8.5* 8.4* 8.6* 8.9 9.0 8.3*  MG 1.4*  --  2.1 1.9  --   --  1.2* 1.8  PHOS 1.7*  --  <1.0* <1.0*  --  <1.0*  --  1.5*   < > = values in this interval not displayed.    GFR: Estimated Creatinine Clearance: 74.5 mL/min (A) (by C-G formula based on SCr of 0.49 mg/dL (L)).  Liver Function Tests: Recent Labs  Lab 09/24/22 0608 09/24/22 0821 09/25/22 0515 09/28/22 1354 09/29/22 0757  AST 135* 130* 137* 131* 97*  ALT 51* 50* 53* 65* 52*  ALKPHOS 93 85 100 95 76  BILITOT 13.3* 13.1* 12.9* 17.5* 15.0*  PROT 7.5 7.1 7.4 7.8 6.3*  ALBUMIN 2.8* 2.6* 2.6* 2.9* 2.3*    Recent Labs  Lab 09/23/22 1751 09/28/22 1354  LIPASE 73* 42   Recent Labs  Lab 09/23/22 2326 09/28/22 1356  AMMONIA 50* 59*    Coagulation Profile: Recent Labs  Lab 09/23/22 1751 09/24/22 0821 09/25/22 0515 09/29/22 0757  INR 2.4* 1.7* 1.6* 1.8*      CBG: Recent Labs  Lab 09/23/22 1351  GLUCAP 70     Recent Results (from the past 240 hour(s))  Urine Culture     Status: Abnormal   Collection Time: 09/23/22  1:46 AM   Specimen: Urine, Clean Catch  Result Value Ref Range Status   Specimen Description   Final    URINE, CLEAN CATCH Performed at [facility], [place] [address], [place], [place] [address]    Special Requests   Final    NONE Performed at [facility], [place] [address] [place], [place] [address]    Culture (A)  Final    <10,000 COLONIES/mL INSIGNIFICANT GROWTH Performed at [place] [address], [place], [place] [address]    Report Status 09/25/2022 FINAL  Final  Resp panel by RT-PCR (RSV, Flu A&B, Covid) Anterior Nasal Swab     Status: None   Collection Time: 09/23/22  5:51 PM   Specimen: Anterior Nasal Swab  Result Value Ref Range Status   SARS Coronavirus 2 by RT PCR NEGATIVE NEGATIVE Final    Comment: (NOTE) SARS-CoV-2 target nucleic acids are NOT DETECTED.  The SARS-CoV-2 RNA is generally detectable in upper respiratory specimens during the acute phase of infection. The lowest concentration of SARS-CoV-2 viral copies this assay can detect is 138 copies/mL. A negative result does not preclude SARS-Cov-2 infection and should not be used as the sole basis for treatment or other patient management decisions. A negative result may occur with  improper specimen collection/handling, submission of specimen other than nasopharyngeal swab, presence of viral mutation(s) within the areas targeted by this assay, and inadequate number of viral copies(<138 copies/mL). A negative result must be combined with clinical observations, patient history, and epidemiological information. The expected result is Negative.  Fact Sheet for Patients:  EntrepreneurPulse.com.au  Fact Sheet for Healthcare Providers:   IncredibleEmployment.be  This test is no t yet approved or cleared by the [place] FDA and  has been authorized for detection and/or diagnosis of SARS-CoV-2 by FDA under an Emergency Use Authorization (EUA). This EUA will remain  in effect (meaning this test can be used) for the duration of the COVID-19 declaration under Section 564(b)(1) of the Act, 21 U.S.C.section 360bbb-3(b)(1), unless the authorization is terminated  or revoked sooner.       Influenza A by PCR NEGATIVE NEGATIVE Final   Influenza B by PCR NEGATIVE NEGATIVE Final    Comment: (NOTE) The Xpert Xpress SARS-CoV-2/FLU/RSV plus assay is intended as an aid in the diagnosis of influenza from Nasopharyngeal swab specimens and should not be used as a sole basis for treatment. Nasal washings and aspirates are unacceptable for Xpert Xpress SARS-CoV-2/FLU/RSV testing.  Fact Sheet for Patients: EntrepreneurPulse.com.au  Fact Sheet for Healthcare Providers: IncredibleEmployment.be  This test is not yet approved or cleared by the [place] FDA and has been authorized for detection and/or diagnosis of SARS-CoV-2 by FDA under an Emergency Use Authorization (EUA). This EUA will remain in effect (meaning this test can be used) for the duration of the COVID-19 declaration under Section 564(b)(1) of the Act, 21 U.S.C. section 360bbb-3(b)(1), unless the authorization is terminated or revoked.     Resp Syncytial Virus by PCR NEGATIVE NEGATIVE Final    Comment: (NOTE) Fact Sheet for Patients: EntrepreneurPulse.com.au  Fact Sheet for Healthcare Providers: IncredibleEmployment.be  This test is not yet approved or cleared by the [place] FDA and has been authorized for detection and/or diagnosis of SARS-CoV-2 by FDA under an Emergency Use Authorization (EUA). This EUA will remain in effect (meaning this test can be used) for  the duration of the COVID-19 declaration under Section 564(b)(1) of the Act, 21 U.S.C. section 360bbb-3(b)(1), unless the authorization is terminated or revoked.  Performed at [facility], [place] [address], [place], [place] [address]   Culture, blood (routine x 2)     Status: None   Collection Time: 09/23/22 11:26 PM   Specimen: BLOOD  Result Value Ref Range Status   Specimen Description   Final    BLOOD RIGHT ANTECUBITAL Performed at [facility] [address] [place], [place] [address]    Special Requests   Final    BOTTLES DRAWN AEROBIC AND ANAEROBIC Blood Culture results may not be optimal due to an excessive volume of blood received in culture bottles Performed at [place] [address] [place], [place] [address]    Culture   Final    NO GROWTH 5 DAYS Performed at [facility], [place] [address] [place], [place] [address]    Report Status 09/29/2022 FINAL  Final  Culture, blood (routine x 2)     Status: None   Collection Time: 09/24/22  6:08 AM   Specimen: BLOOD LEFT ARM  Result Value Ref Range Status   Specimen Description   Final    BLOOD LEFT ARM Performed at [facility], [place] [address], [place], [place] [address]    Special Requests   Final    BOTTLES DRAWN AEROBIC ONLY Blood Culture adequate volume Performed at [place] [address], [place], [place] [address]    Culture   Final    NO GROWTH 5 DAYS Performed at [facility], [place] [address] [place], [place] [address]    Report Status 09/29/2022 FINAL  Final      Radiology Studies: No results found.     LOS: 1 day   Dierre Crevier [employer] on [email or web address]  09/29/2022, 11:54 AM

## 2022-09-30 DIAGNOSIS — K7011 Alcoholic hepatitis with ascites: Secondary | ICD-10-CM | POA: Diagnosis not present

## 2022-09-30 DIAGNOSIS — K7682 Hepatic encephalopathy: Secondary | ICD-10-CM | POA: Diagnosis not present

## 2022-09-30 DIAGNOSIS — K7031 Alcoholic cirrhosis of liver with ascites: Secondary | ICD-10-CM | POA: Diagnosis not present

## 2022-09-30 LAB — CBC
HCT: 22.7 % — ABNORMAL LOW (ref 39.0–52.0)
Hemoglobin: 7.4 g/dL — ABNORMAL LOW (ref 13.0–17.0)
MCH: 28.8 pg (ref 26.0–34.0)
MCHC: 32.6 g/dL (ref 30.0–36.0)
MCV: 88.3 fL (ref 80.0–100.0)
Platelets: 167 10*3/uL (ref 150–400)
RBC: 2.57 MIL/uL — ABNORMAL LOW (ref 4.22–5.81)
RDW: 30.2 % — ABNORMAL HIGH (ref 11.5–15.5)
WBC: 7.9 10*3/uL (ref 4.0–10.5)
nRBC: 0 % (ref 0.0–0.2)

## 2022-09-30 LAB — COMPREHENSIVE METABOLIC PANEL
ALT: 51 U/L — ABNORMAL HIGH (ref 0–44)
AST: 84 U/L — ABNORMAL HIGH (ref 15–41)
Albumin: 2.4 g/dL — ABNORMAL LOW (ref 3.5–5.0)
Alkaline Phosphatase: 90 U/L (ref 38–126)
Anion gap: 9 (ref 5–15)
BUN: <5 mg/dL — ABNORMAL LOW (ref 6–20)
CO2: 18 mmol/L — ABNORMAL LOW (ref 22–32)
Calcium: 8.5 mg/dL — ABNORMAL LOW (ref 8.9–10.3)
Chloride: 102 mmol/L (ref 98–111)
Creatinine, Ser: 0.52 mg/dL — ABNORMAL LOW (ref 0.61–1.24)
GFR, Estimated: >60 mL/min (ref >60)
Glucose, Bld: 115 mg/dL — ABNORMAL HIGH (ref 70–99)
Potassium: 3.6 mmol/L (ref 3.5–5.1)
Sodium: 129 mmol/L — ABNORMAL LOW (ref 135–145)
Total Bilirubin: 12.9 mg/dL — ABNORMAL HIGH (ref 0.3–1.2)
Total Protein: 6.6 g/dL (ref 6.5–8.1)

## 2022-09-30 LAB — PROTIME-INR
INR: 1.7 — ABNORMAL HIGH (ref 0.8–1.2)
Prothrombin Time: 19.8 seconds — ABNORMAL HIGH (ref 11.4–15.2)

## 2022-09-30 LAB — MAGNESIUM: Magnesium: 2.1 mg/dL (ref 1.7–2.4)

## 2022-09-30 LAB — AMMONIA: Ammonia: 65 umol/L — ABNORMAL HIGH (ref 9–35)

## 2022-09-30 MED ORDER — VITAMIN K1 10 MG/ML IJ SOLN
10.0000 mg | Freq: Once | INTRAVENOUS | Status: AC
  Administered 2022-09-30: 10 mg via INTRAVENOUS
  Filled 2022-09-30: qty 1

## 2022-09-30 MED ORDER — LACTULOSE 10 GM/15ML PO SOLN
30.0000 g | Freq: Three times a day (TID) | ORAL | Status: DC
  Administered 2022-09-30 – 2022-10-01 (×5): 30 g via ORAL
  Filled 2022-09-30 (×5): qty 45

## 2022-09-30 NOTE — Progress Notes (Signed)
Mobility Specialist - Progress Note   09/30/22 0940  Mobility  Activity Ambulated with assistance in hallway  Level of Assistance Modified independent, requires aide device or extra time  Assistive Device Front wheel walker  Distance Ambulated (ft) 650 ft  Activity Response Tolerated well  Mobility Referral Yes  $Mobility charge 1 Mobility   Pt received in bed and agreeable to mobility. No complaints during ambulation session. Pt to EOB after session with all needs met.    [facility]

## 2022-09-30 NOTE — Progress Notes (Signed)
Mobility Specialist - Progress Note   09/30/22 1350  Mobility  Activity Ambulated with assistance in hallway  Level of Assistance Modified independent, requires aide device or extra time  Assistive Device Front wheel walker  Distance Ambulated (ft) 500 ft  Activity Response Tolerated well  Mobility Referral Yes  $Mobility charge 1 Mobility   Pt received in room standing and agreeable to mobility. No complaints during ambulation. Pt to bed after session with all needs met.    [facility]

## 2022-09-30 NOTE — Progress Notes (Signed)
[facility] PROGRESS NOTE   Gavan Nordby Werk NFA:[id number] DOB: 03-19-1966 DOA: 09/28/2022  PCP: Charlott Rakes, MD  Brief History/Interval Summary: 56 y.o. 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 Patient was admitted to the hospital on 09/24/2022 with acute alcoholic hepatitis, was seen by GI and started on prednisolone, but left AMA on 09/25/2022.    Consultants: Recently seen by [facility] gastroenterology  Procedures: None    Subjective/Interval History: Patient is awake alert this morning.  Denies any abdominal pain nausea or vomiting.     Assessment/Plan:  Acute alcoholic hepatitis/hepatic encephalopathy/liver cirrhosis Patient left AGAINST MEDICAL ADVICE on 12/24.  He was seen by gastroenterology during that hospitalization and was started on prednisolone. Alpha-fetoprotein level is normal. Will reinitiate prednisolone per GI recommendations. Ammonia level was noted to be elevated.  Started on lactulose.  No asterixis noted on examination today.  Ammonia level noted to be higher today though his mentation has improved.  Only having 1 bowel movement today.  Will increase the dose of lactulose.   Patient with's significant hyperbilirubinemia with a bilirubin of 15.  AST and ALT are noted to be abnormal as well. Bilirubin level noted to be slightly better today.  AST ALT slightly better today.  Recheck labs tomorrow.  Coagulopathy Secondary to liver disease.  INR is 1.8.  Vitamin K will be ordered.  Recheck INR tomorrow  Hyponatremia/hypokalemia/hypophosphatemia/hypomagnesemia Sodium level is low but stable.  Likely due to liver disease. Potassium level has improved.  Magnesium has  improved.  Will recheck phosphorus levels tomorrow.  Alcohol abuse On CIWA protocol.  No evidence of withdrawal currently.  Patient counseled regarding his alcohol abuse.  He was told that he should not be drinking anymore.  Normocytic anemia/thrombocytopenia No evidence for overt bleeding.  Monitor hemoglobin.  Platelet counts are stable.  Ascites Diuretics on hold currently.   Consider resuming in the next 24 to 48 hours.  Previous history of colon cancer status post colectomy Stable.  Cholelithiasis Seems to be asymptomatic.   DVT Prophylaxis: auto anticoagulated Code Status: Full code Family Communication: Discussed with patient.  Mother was updated yesterday. Disposition Plan: To be determined  Status is: Inpatient Remains inpatient appropriate because: Alcoholic hepatitis, hepatic encephalopathy    Medications: Scheduled:  enoxaparin (LOVENOX) injection  40 mg Subcutaneous I69G   folic acid  1 mg Oral Daily   lactulose  30 g Oral TID   LORazepam  0-4 mg Intravenous Q6H   Followed by   LORazepam  0-4 mg Intravenous Q12H   multivitamin with minerals  1 tablet Oral Daily   pantoprazole  40 mg Oral Daily   prednisoLONE  40 mg Oral Daily   thiamine  100 mg Oral Daily   Or   thiamine  100 mg Intravenous Daily   Continuous:   EXB:[id number] **OR** acetaminophen, LORazepam **OR** LORazepam, senna-docusate  Antibiotics: Anti-infectives (From admission, onward)    None       Objective:  Vital Signs  Vitals:   09/29/22 1000 09/29/22 1351 09/29/22 2024 09/30/22 0514  BP: 115/65 108/64 120/61 120/74  Pulse: 65 73 77 61  Resp: '20 20 18 18  '$ Temp: 97.8 F (36.6 C) 97.8 F (36.6 C) 98.6 F (37 C) 98 F (36.7 C)  TempSrc: Oral  Oral Oral Oral  SpO2: 99% 100% 100% 100%    Intake/Output Summary (Last 24 hours) at 09/30/2022 1212 Last data filed at 09/29/2022 1511 Gross per 24 hour  Intake 766.82 ml  Output --  Net 766.82 ml    There were no vitals  filed for this visit.  General appearance: Awake alert.  In no distress Resp: Clear to auscultation bilaterally.  Normal effort Cardio: S1-S2 is normal regular.  No S3-S4.  No rubs murmurs or bruit GI: Abdomen is soft.  Nontender nondistended.  Bowel sounds are present normal.  No masses organomegaly Extremities: No edema.  Full range of motion of lower extremities. Neurologic: Alert and oriented x3.  No focal neurological deficits.     Lab Results:  Data Reviewed: I have personally reviewed following labs and reports of the imaging studies  CBC: Recent Labs  Lab 09/23/22 1751 09/23/22 1854 09/24/22 0608 09/24/22 0821 09/25/22 0515 09/28/22 1354 09/29/22 0757 09/30/22 0640  WBC 8.6  --  6.7  --  7.4 9.5 7.3 7.9  NEUTROABS 6.6  --  4.9  --   --  7.1  --   --   HGB 8.9*   < > 8.7* 8.3* 7.9* 8.1* 7.4* 7.4*  HCT 25.6*   < > 24.9* 24.4* 23.3* 24.4* 22.7* 22.7*  MCV 80.5  --  79.6*  --  80.1 85.3 86.6 88.3  PLT 107*  --  92*  --  105* 129* 138* 167   < > = values in this interval not displayed.     Basic Metabolic Panel: Recent Labs  Lab 09/23/22 1751 09/23/22 1854 09/24/22 8182 09/24/22 0821 09/24/22 1533 09/25/22 0515 09/28/22 1354 09/29/22 0757 09/30/22 0640  NA 125*   < > 126* 125* 125* 126* 129* 130* 129*  K 2.6*   < > 2.5* 2.5* 3.5 3.0* 2.9* 3.1* 3.6  CL 71*   < > 79* 78* 80* 87* 95* 101 102  CO2 28  --  32 33* 32 26 20* 20* 18*  GLUCOSE 89   < > 108* 129* 122* 91 85 91 115*  BUN 9   < > 6 5* 5* <5* 7 6 <5*  CREATININE 0.71   < > 0.52* 0.51* 0.57* 0.46* 0.39* 0.49* 0.52*  CALCIUM 8.0*  --  8.5* 8.4* 8.6* 8.9 9.0 8.3* 8.5*  MG 1.4*  --  2.1 1.9  --   --  1.2* 1.8 2.1  PHOS 1.7*  --  <1.0* <1.0*  --  <1.0*  --  1.5*  --    < > = values in this interval not displayed.     GFR: Estimated Creatinine Clearance: 74.5 mL/min (A) (by C-G formula based on SCr of 0.52 mg/dL (L)).  Liver Function Tests: Recent Labs  Lab 09/24/22 0821 09/25/22 0515  09/28/22 1354 09/29/22 0757 09/30/22 0640  AST 130* 137* 131* 97* 84*  ALT 50* 53* 65* 52* 51*  ALKPHOS 85 100 95 76 90  BILITOT 13.1* 12.9* 17.5* 15.0* 12.9*  PROT 7.1 7.4 7.8 6.3* 6.6  ALBUMIN 2.6* 2.6* 2.9* 2.3* 2.4*     Recent Labs  Lab 09/23/22 1751 09/28/22 1354  LIPASE 73* 42    Recent Labs  Lab 09/23/22 2326 09/28/22 1356 09/30/22 0640  AMMONIA 50* 59* 65*     Coagulation Profile: Recent Labs  Lab 09/23/22 1751 09/24/22 0821 09/25/22 0515 09/29/22 0757 09/30/22 0640  INR 2.4* 1.7* 1.6* 1.8* 1.7*      CBG: Recent  Labs  Lab 09/23/22 1351  GLUCAP 70      Recent Results (from the past 240 hour(s))  Urine Culture     Status: Abnormal   Collection Time: 09/23/22  1:46 AM   Specimen: Urine, Clean Catch  Result Value Ref Range Status   Specimen Description   Final    URINE, CLEAN CATCH Performed at [facility], [place] [address] [place], [place] [address]    Special Requests   Final    NONE Performed at [facility], [place] [address] [place], [place] [address]    Culture (A)  Final    <10,000 COLONIES/mL INSIGNIFICANT GROWTH Performed at [place] [address], [place], [place] [address]    Report Status 09/25/2022 FINAL  Final  Resp panel by RT-PCR (RSV, Flu A&B, Covid) Anterior Nasal Swab     Status: None   Collection Time: 09/23/22  5:51 PM   Specimen: Anterior Nasal Swab  Result Value Ref Range Status   SARS Coronavirus 2 by RT PCR NEGATIVE NEGATIVE Final    Comment: (NOTE) SARS-CoV-2 target nucleic acids are NOT DETECTED.  The SARS-CoV-2 RNA is generally detectable in upper respiratory specimens during the acute phase of infection. The lowest concentration of SARS-CoV-2 viral copies this assay can detect is 138 copies/mL. A negative result does not preclude SARS-Cov-2 infection and should not be used as the sole basis for treatment or other patient management decisions. A negative  result may occur with  improper specimen collection/handling, submission of specimen other than nasopharyngeal swab, presence of viral mutation(s) within the areas targeted by this assay, and inadequate number of viral copies(<138 copies/mL). A negative result must be combined with clinical observations, patient history, and epidemiological information. The expected result is Negative.  Fact Sheet for Patients:  EntrepreneurPulse.com.au  Fact Sheet for Healthcare Providers:  IncredibleEmployment.be  This test is no t yet approved or cleared by the [place] FDA and  has been authorized for detection and/or diagnosis of SARS-CoV-2 by FDA under an Emergency Use Authorization (EUA). This EUA will remain  in effect (meaning this test can be used) for the duration of the COVID-19 declaration under Section 564(b)(1) of the Act, 21 U.S.C.section 360bbb-3(b)(1), unless the authorization is terminated  or revoked sooner.       Influenza A by PCR NEGATIVE NEGATIVE Final   Influenza B by PCR NEGATIVE NEGATIVE Final    Comment: (NOTE) The Xpert Xpress SARS-CoV-2/FLU/RSV plus assay is intended as an aid in the diagnosis of influenza from Nasopharyngeal swab specimens and should not be used as a sole basis for treatment. Nasal washings and aspirates are unacceptable for Xpert Xpress SARS-CoV-2/FLU/RSV testing.  Fact Sheet for Patients: EntrepreneurPulse.com.au  Fact Sheet for Healthcare Providers: IncredibleEmployment.be  This test is not yet approved or cleared by the [place] FDA and has been authorized for detection and/or diagnosis of SARS-CoV-2 by FDA under an Emergency Use Authorization (EUA). This EUA will remain in effect (meaning this test can be used) for the duration of the COVID-19 declaration under Section 564(b)(1) of the Act, 21 U.S.C. section 360bbb-3(b)(1), unless the authorization is  terminated or revoked.     Resp Syncytial Virus by PCR NEGATIVE NEGATIVE Final    Comment: (NOTE) Fact Sheet for Patients: EntrepreneurPulse.com.au  Fact Sheet for Healthcare Providers: IncredibleEmployment.be  This test is not yet approved or cleared by the [place] FDA and has been authorized for detection and/or diagnosis of SARS-CoV-2 by FDA  under an Emergency Use Authorization (EUA). This EUA will remain in effect (meaning this test can be used) for the duration of the COVID-19 declaration under Section 564(b)(1) of the Act, 21 U.S.C. section 360bbb-3(b)(1), unless the authorization is terminated or revoked.  Performed at [facility], [place] [address] [place], [place] [address]   Culture, blood (routine x 2)     Status: None   Collection Time: 09/23/22 11:26 PM   Specimen: BLOOD  Result Value Ref Range Status   Specimen Description   Final    BLOOD RIGHT ANTECUBITAL Performed at [place] [address], [place], [place] [address]    Special Requests   Final    BOTTLES DRAWN AEROBIC AND ANAEROBIC Blood Culture results may not be optimal due to an excessive volume of blood received in culture bottles Performed at [place] [address], [place], [place] [address]    Culture   Final    NO GROWTH 5 DAYS Performed at [facility], [place] [address], [place], [place] [address]    Report Status 09/29/2022 FINAL  Final  Culture, blood (routine x 2)     Status: None   Collection Time: 09/24/22  6:08 AM   Specimen: BLOOD LEFT ARM  Result Value Ref Range Status   Specimen Description   Final    BLOOD LEFT ARM Performed at [facility], [place] [address] [place], [place] [address]    Special Requests   Final    BOTTLES DRAWN AEROBIC ONLY Blood Culture adequate volume Performed at [place] [address] [place], [place] [address]     Culture   Final    NO GROWTH 5 DAYS Performed at [facility], [place] [address], [place], [place] [address]    Report Status 09/29/2022 FINAL  Final      Radiology Studies: No results found.     LOS: 2 days   [facility] Pager on [email or web address]  09/30/2022, 12:12 PM

## 2022-10-01 DIAGNOSIS — D696 Thrombocytopenia, unspecified: Secondary | ICD-10-CM | POA: Diagnosis not present

## 2022-10-01 DIAGNOSIS — K7011 Alcoholic hepatitis with ascites: Secondary | ICD-10-CM | POA: Diagnosis not present

## 2022-10-01 DIAGNOSIS — E871 Hypo-osmolality and hyponatremia: Secondary | ICD-10-CM | POA: Diagnosis not present

## 2022-10-01 LAB — COMPREHENSIVE METABOLIC PANEL
ALT: 55 U/L — ABNORMAL HIGH (ref 0–44)
AST: 92 U/L — ABNORMAL HIGH (ref 15–41)
Albumin: 2.7 g/dL — ABNORMAL LOW (ref 3.5–5.0)
Alkaline Phosphatase: 104 U/L (ref 38–126)
Anion gap: 10 (ref 5–15)
BUN: 6 mg/dL (ref 6–20)
CO2: 20 mmol/L — ABNORMAL LOW (ref 22–32)
Calcium: 8.9 mg/dL (ref 8.9–10.3)
Chloride: 101 mmol/L (ref 98–111)
Creatinine, Ser: 0.5 mg/dL — ABNORMAL LOW (ref 0.61–1.24)
GFR, Estimated: >60 mL/min (ref >60)
Glucose, Bld: 107 mg/dL — ABNORMAL HIGH (ref 70–99)
Potassium: 3.4 mmol/L — ABNORMAL LOW (ref 3.5–5.1)
Sodium: 131 mmol/L — ABNORMAL LOW (ref 135–145)
Total Bilirubin: 14.3 mg/dL — ABNORMAL HIGH (ref 0.3–1.2)
Total Protein: 7.6 g/dL (ref 6.5–8.1)

## 2022-10-01 LAB — CBC
HCT: 27.7 % — ABNORMAL LOW (ref 39.0–52.0)
Hemoglobin: 9 g/dL — ABNORMAL LOW (ref 13.0–17.0)
MCH: 28.8 pg (ref 26.0–34.0)
MCHC: 32.5 g/dL (ref 30.0–36.0)
MCV: 88.8 fL (ref 80.0–100.0)
Platelets: 248 10*3/uL (ref 150–400)
RBC: 3.12 MIL/uL — ABNORMAL LOW (ref 4.22–5.81)
RDW: 30.5 % — ABNORMAL HIGH (ref 11.5–15.5)
WBC: 10.4 10*3/uL (ref 4.0–10.5)
nRBC: 0 % (ref 0.0–0.2)

## 2022-10-01 LAB — PROTIME-INR
INR: 1.5 — ABNORMAL HIGH (ref 0.8–1.2)
Prothrombin Time: 18.1 seconds — ABNORMAL HIGH (ref 11.4–15.2)

## 2022-10-01 LAB — PHOSPHORUS: Phosphorus: 2 mg/dL — ABNORMAL LOW (ref 2.5–4.6)

## 2022-10-01 LAB — MAGNESIUM: Magnesium: 1.6 mg/dL — ABNORMAL LOW (ref 1.7–2.4)

## 2022-10-01 MED ORDER — MAGNESIUM SULFATE 2 GM/50ML IV SOLN
2.0000 g | Freq: Once | INTRAVENOUS | Status: AC
  Administered 2022-10-01: 2 g via INTRAVENOUS
  Filled 2022-10-01: qty 50

## 2022-10-01 MED ORDER — POTASSIUM CHLORIDE CRYS ER 20 MEQ PO TBCR
40.0000 meq | EXTENDED_RELEASE_TABLET | Freq: Once | ORAL | Status: AC
  Administered 2022-10-01: 40 meq via ORAL
  Filled 2022-10-01: qty 2

## 2022-10-01 NOTE — TOC Progression Note (Signed)
Transition of Care [facility]) - Progression Note   Patient Details  Name: Richard [name] MRN: [id number] Date of Birth: 04-27-1966  [facility] CM/SW Contact  Henrietta Dine, RN Phone Number: 10/01/2022, 8:29 PM  Clinical Narrative:     [facility] consult for SA education/counseling; pt not in room; will attempt to see pt later today.       Expected Discharge Plan and Services                                               Social Determinants of Health (SDOH) Interventions SDOH Screenings   Food Insecurity: Food Insecurity Present (09/28/2022)  Housing: Low Risk  (09/28/2022)  Transportation Needs: No Transportation Needs (09/28/2022)  Utilities: Not At Risk (09/28/2022)  Alcohol Screen: Low Risk  (09/22/2021)  Depression (PHQ2-9): High Risk (11/09/2021)  Financial Resource Strain: High Risk (06/03/2020)  Tobacco Use: High Risk (09/28/2022)    Readmission Risk Interventions    11/05/2021   12:56 PM  Readmission Risk Prevention Plan  Transportation Screening Complete  PCP or Specialist Appt within 5-7 Days Complete  Home Care Screening Complete  Medication Review (RN CM) Complete

## 2022-10-01 NOTE — Progress Notes (Signed)
Mobility Specialist - Progress Note   10/01/22 1044  Mobility  Activity Ambulated with assistance in hallway  Level of Assistance Modified independent, requires aide device or extra time  Assistive Device Front wheel walker  Distance Ambulated (ft) 500 ft  Activity Response Tolerated well  Mobility Referral Yes  $Mobility charge 1 Mobility   Pt received in recliner and agreeable to mobility. Upon returning to room, assisted pt w/ theraband (red) arm exercises. Pt completed 4 exercises w/ 5 reps on each arm. No complaints during session. Pt to recliner after session with all needs met.   [facility]

## 2022-10-01 NOTE — Progress Notes (Signed)
PROGRESS NOTE    Erbie Arment Trussell  ONG:[id number] DOB: 11-Oct-1965 DOA: 09/28/2022 PCP: Charlott Rakes, MD   Brief Narrative:  56 y.o. male with medical history significant for alcoholism with chronic liver disease, colon cancer status post partial colectomy, history of alcoholic cardiomyopathy with recovered EF in January 2023, and recent admission with acute alcoholic hepatitis who returned to the emergency department with fatigue and loss of appetite. Patient was admitted to the hospital on 09/24/2022 with acute alcoholic hepatitis, was seen by GI and started on prednisolone, but left AMA on 09/25/2022.  He was restarted on oral prednisone.  Assessment & Plan:   Acute alcoholic hepatitis/hepatic encephalopathy/liver cirrhosis Patient left AGAINST MEDICAL ADVICE on 12/24.  He was seen by gastroenterology during that hospitalization and was started on prednisolone. Alpha-fetoprotein level is normal. -Continue prednisone for 28 days.  Outpatient follow-up with GI. -Continue lactulose for elevated ammonia level.  Mental status stable. -Bilirubin 14.3 today.  Repeat a.m. labs.  AST and ALT are still mildly elevated.   Coagulopathy Secondary to liver disease.  Improved, INR 1.5 today.  Received vitamin K during this hospitalization.  Hyponatremia/hypokalemia/hypophosphatemia/hypomagnesemia Sodium level is low but stable.  Likely due to liver disease. -Replace potassium and magnesium.  Replace phosphorus.  Alcohol abuse On CIWA protocol.  No evidence of withdrawal currently.  Patient counseled regarding his alcohol abuse.  He was told that he should not be drinking anymore.   Normocytic anemia No evidence for overt bleeding.  Monitor hemoglobin.    Thrombocytopenia -Resolved.  Platelet counts are stable.   Ascites -Diuretics on hold currently.   Consider resuming in the next 24 to 48 hours.   Previous history of colon cancer status post colectomy Stable.    Cholelithiasis Seems to be asymptomatic.     DVT Prophylaxis: auto anticoagulated Code Status: Full code Family Communication: Discussed with patient and niece at bedside disposition Plan: To be determined   Status is: Inpatient Remains inpatient appropriate because: Alcoholic hepatitis, hepatic encephalopathy    Consultants: None  Procedures: None  Antimicrobials: None   Subjective: Patient seen and examined at bedside.  Feels slightly better.  Denies worsening abdominal pain, vomiting or fevers.  Objective: Vitals:   09/30/22 1800 09/30/22 2021 10/01/22 0420 10/01/22 0600  BP: 122/72 123/69 113/76   Pulse: 84 68 76   Resp:  18 18   Temp:  98.2 F (36.8 C) 97.7 F (36.5 C)   TempSrc:  Oral Oral   SpO2:  100% 100%   Weight:    50.1 kg    Intake/Output Summary (Last 24 hours) at 10/01/2022 1020 Last data filed at 10/01/2022 0230 Gross per 24 hour  Intake 360 ml  Output --  Net 360 ml   Filed Weights   10/01/22 0600  Weight: 50.1 kg    Examination:  General exam: Appears calm and comfortable.  On room air.  Slow to respond but answers some questions. Respiratory system: Bilateral decreased breath sounds at bases Cardiovascular system: S1 & S2 heard, Rate controlled Gastrointestinal system: Abdomen is distended slightly, soft and nontender. Normal bowel sounds heard. Extremities: No cyanosis, clubbing, edema  Central nervous system: Alert and oriented.  Poor historian.  No focal neurological deficits. Moving extremities Skin: No rashes, lesions or ulcers Psychiatry: Flat affect.  Not agitated.    Data Reviewed: I have personally reviewed following labs and imaging studies  CBC: Recent Labs  Lab 09/25/22 0515 09/28/22 1354 09/29/22 0757 09/30/22 0640 10/01/22 0810  WBC 7.4 9.5  7.3 7.9 10.4  NEUTROABS  --  7.1  --   --   --   HGB 7.9* 8.1* 7.4* 7.4* 9.0*  HCT 23.3* 24.4* 22.7* 22.7* 27.7*  MCV 80.1 85.3 86.6 88.3 88.8  PLT 105* 129* 138* 167  161   Basic Metabolic Panel: Recent Labs  Lab 09/25/22 0515 09/28/22 1354 09/29/22 0757 09/30/22 0640 10/01/22 0810  NA 126* 129* 130* 129* 131*  K 3.0* 2.9* 3.1* 3.6 3.4*  CL 87* 95* 101 102 101  CO2 26 20* 20* 18* 20*  GLUCOSE 91 85 91 115* 107*  BUN <5* 7 6 <5* 6  CREATININE 0.46* 0.39* 0.49* 0.52* 0.50*  CALCIUM 8.9 9.0 8.3* 8.5* 8.9  MG  --  1.2* 1.8 2.1 1.6*  PHOS <1.0*  --  1.5*  --  2.0*   GFR: Estimated Creatinine Clearance: 74.8 mL/min (A) (by C-G formula based on SCr of 0.5 mg/dL (L)). Liver Function Tests: Recent Labs  Lab 09/25/22 0515 09/28/22 1354 09/29/22 0757 09/30/22 0640 10/01/22 0810  AST 137* 131* 97* 84* 92*  ALT 53* 65* 52* 51* 55*  ALKPHOS 100 95 76 90 104  BILITOT 12.9* 17.5* 15.0* 12.9* 14.3*  PROT 7.4 7.8 6.3* 6.6 7.6  ALBUMIN 2.6* 2.9* 2.3* 2.4* 2.7*   Recent Labs  Lab 09/28/22 1354  LIPASE 42   Recent Labs  Lab 09/28/22 1356 09/30/22 0640  AMMONIA 59* 65*   Coagulation Profile: Recent Labs  Lab 09/25/22 0515 09/29/22 0757 09/30/22 0640 10/01/22 0810  INR 1.6* 1.8* 1.7* 1.5*   Cardiac Enzymes: No results for input(s): "CKTOTAL", "CKMB", "CKMBINDEX", "TROPONINI" in the last 168 hours. BNP (last 3 results) No results for input(s): "PROBNP" in the last 8760 hours. HbA1C: No results for input(s): "HGBA1C" in the last 72 hours. CBG: No results for input(s): "GLUCAP" in the last 168 hours. Lipid Profile: No results for input(s): "CHOL", "HDL", "LDLCALC", "TRIG", "CHOLHDL", "LDLDIRECT" in the last 72 hours. Thyroid Function Tests: No results for input(s): "TSH", "T4TOTAL", "FREET4", "T3FREE", "THYROIDAB" in the last 72 hours. Anemia Panel: No results for input(s): "VITAMINB12", "FOLATE", "FERRITIN", "TIBC", "IRON", "RETICCTPCT" in the last 72 hours. Sepsis Labs: Recent Labs  Lab 09/24/22 1038 09/28/22 1356  LATICACIDVEN 3.7* 2.1*    Recent Results (from the past 240 hour(s))  Urine Culture     Status: Abnormal    Collection Time: 09/23/22  1:46 AM   Specimen: Urine, Clean Catch  Result Value Ref Range Status   Specimen Description   Final    URINE, CLEAN CATCH Performed at [facility], [place] [address], [place], [place] [address]    Special Requests   Final    NONE Performed at [facility], [place] [address], [place], [place] [address]    Culture (A)  Final    <10,000 COLONIES/mL INSIGNIFICANT GROWTH Performed at [place] [address], [place], [place] [address]    Report Status 09/25/2022 FINAL  Final  Resp panel by RT-PCR (RSV, Flu A&B, Covid) Anterior Nasal Swab     Status: None   Collection Time: 09/23/22  5:51 PM   Specimen: Anterior Nasal Swab  Result Value Ref Range Status   SARS Coronavirus 2 by RT PCR NEGATIVE NEGATIVE Final    Comment: (NOTE) SARS-CoV-2 target nucleic acids are NOT DETECTED.  The SARS-CoV-2 RNA is generally detectable in upper respiratory specimens during the acute phase of infection. The lowest concentration of SARS-CoV-2 viral copies this assay can  detect is 138 copies/mL. A negative result does not preclude SARS-Cov-2 infection and should not be used as the sole basis for treatment or other patient management decisions. A negative result may occur with  improper specimen collection/handling, submission of specimen other than nasopharyngeal swab, presence of viral mutation(s) within the areas targeted by this assay, and inadequate number of viral copies(<138 copies/mL). A negative result must be combined with clinical observations, patient history, and epidemiological information. The expected result is Negative.  Fact Sheet for Patients:  EntrepreneurPulse.com.au  Fact Sheet for Healthcare Providers:  IncredibleEmployment.be  This test is no t yet approved or cleared by the [place] FDA and  has been authorized for detection and/or diagnosis of SARS-CoV-2 by FDA  under an Emergency Use Authorization (EUA). This EUA will remain  in effect (meaning this test can be used) for the duration of the COVID-19 declaration under Section 564(b)(1) of the Act, 21 U.S.C.section 360bbb-3(b)(1), unless the authorization is terminated  or revoked sooner.       Influenza A by PCR NEGATIVE NEGATIVE Final   Influenza B by PCR NEGATIVE NEGATIVE Final    Comment: (NOTE) The Xpert Xpress SARS-CoV-2/FLU/RSV plus assay is intended as an aid in the diagnosis of influenza from Nasopharyngeal swab specimens and should not be used as a sole basis for treatment. Nasal washings and aspirates are unacceptable for Xpert Xpress SARS-CoV-2/FLU/RSV testing.  Fact Sheet for Patients: EntrepreneurPulse.com.au  Fact Sheet for Healthcare Providers: IncredibleEmployment.be  This test is not yet approved or cleared by the [place] FDA and has been authorized for detection and/or diagnosis of SARS-CoV-2 by FDA under an Emergency Use Authorization (EUA). This EUA will remain in effect (meaning this test can be used) for the duration of the COVID-19 declaration under Section 564(b)(1) of the Act, 21 U.S.C. section 360bbb-3(b)(1), unless the authorization is terminated or revoked.     Resp Syncytial Virus by PCR NEGATIVE NEGATIVE Final    Comment: (NOTE) Fact Sheet for Patients: EntrepreneurPulse.com.au  Fact Sheet for Healthcare Providers: IncredibleEmployment.be  This test is not yet approved or cleared by the [place] FDA and has been authorized for detection and/or diagnosis of SARS-CoV-2 by FDA under an Emergency Use Authorization (EUA). This EUA will remain in effect (meaning this test can be used) for the duration of the COVID-19 declaration under Section 564(b)(1) of the Act, 21 U.S.C. section 360bbb-3(b)(1), unless the authorization is terminated or revoked.  Performed at [facility], [place] [address] [place], [place] [address]   Culture, blood (routine x 2)     Status: None   Collection Time: 09/23/22 11:26 PM   Specimen: BLOOD  Result Value Ref Range Status   Specimen Description   Final    BLOOD RIGHT ANTECUBITAL Performed at [facility] [facility] [address], [place], [place] [address]    Special Requests   Final    BOTTLES DRAWN AEROBIC AND ANAEROBIC Blood Culture results may not be optimal due to an excessive volume of blood received in culture bottles Performed at [place] [address] [place], [place] [address]    Culture   Final    NO GROWTH 5 DAYS Performed at [facility], [place] [address], [place], [place] [address]    Report Status 09/29/2022 FINAL  Final  Culture, blood (routine x 2)     Status: None   Collection Time: 09/24/22  6:08 AM   Specimen: BLOOD LEFT ARM  Result Value  Ref Range Status   Specimen Description   Final    BLOOD LEFT ARM Performed at [facility], [place] [address], [place], [place] [address]    Special Requests   Final    BOTTLES DRAWN AEROBIC ONLY Blood Culture adequate volume Performed at [place] [address], [place], [place] [address]    Culture   Final    NO GROWTH 5 DAYS Performed at [facility], [place] [address] [place], [place] [address]    Report Status 09/29/2022 FINAL  Final         Radiology Studies: No results found.      Scheduled Meds:  enoxaparin (LOVENOX) injection  40 mg Subcutaneous Y65L   folic acid  1 mg Oral Daily   lactulose  30 g Oral TID   LORazepam  0-4 mg Intravenous Q12H   multivitamin with minerals  1 tablet Oral Daily   pantoprazole  40 mg Oral Daily   prednisoLONE  40 mg Oral Daily   thiamine  100 mg Oral Daily   Or   thiamine  100 mg Intravenous Daily   Continuous Infusions:        Aline August, MD [facility] 10/01/2022, 10:20 AM

## 2022-10-01 NOTE — Progress Notes (Signed)
Mobility Specialist - Progress Note   10/01/22 1435  Mobility  Activity Ambulated with assistance in hallway  Level of Assistance Modified independent, requires aide device or extra time  Assistive Device Front wheel walker  Distance Ambulated (ft) 525 ft  Activity Response Tolerated well  Mobility Referral Yes  $Mobility charge 1 Mobility   Pt received in recliner and agreeable to mobility. Upon returning to room assisted pt w/ 4 different theraband (red) arm exercises for 5 reps on each arm. No complaints during session. Pt to recliner after session with all needs met.    [facility]

## 2022-10-02 DIAGNOSIS — E871 Hypo-osmolality and hyponatremia: Secondary | ICD-10-CM | POA: Diagnosis not present

## 2022-10-02 DIAGNOSIS — K7031 Alcoholic cirrhosis of liver with ascites: Secondary | ICD-10-CM | POA: Diagnosis not present

## 2022-10-02 DIAGNOSIS — E876 Hypokalemia: Secondary | ICD-10-CM | POA: Diagnosis not present

## 2022-10-02 DIAGNOSIS — K7011 Alcoholic hepatitis with ascites: Secondary | ICD-10-CM | POA: Diagnosis not present

## 2022-10-02 LAB — CBC WITH DIFFERENTIAL/PLATELET
Abs Immature Granulocytes: 0.08 10*3/uL — ABNORMAL HIGH (ref 0.00–0.07)
Basophils Absolute: 0 10*3/uL (ref 0.0–0.1)
Basophils Relative: 0 %
Eosinophils Absolute: 0.2 10*3/uL (ref 0.0–0.5)
Eosinophils Relative: 1 %
HCT: 26.4 % — ABNORMAL LOW (ref 39.0–52.0)
Hemoglobin: 8.7 g/dL — ABNORMAL LOW (ref 13.0–17.0)
Immature Granulocytes: 1 %
Lymphocytes Relative: 13 %
Lymphs Abs: 1.5 10*3/uL (ref 0.7–4.0)
MCH: 28.6 pg (ref 26.0–34.0)
MCHC: 33 g/dL (ref 30.0–36.0)
MCV: 86.8 fL (ref 80.0–100.0)
Monocytes Absolute: 1.6 10*3/uL — ABNORMAL HIGH (ref 0.1–1.0)
Monocytes Relative: 15 %
Neutro Abs: 7.6 10*3/uL (ref 1.7–7.7)
Neutrophils Relative %: 70 %
Platelets: 259 10*3/uL (ref 150–400)
RBC: 3.04 MIL/uL — ABNORMAL LOW (ref 4.22–5.81)
RDW: 30.5 % — ABNORMAL HIGH (ref 11.5–15.5)
WBC: 10.9 10*3/uL — ABNORMAL HIGH (ref 4.0–10.5)
nRBC: 0 % (ref 0.0–0.2)

## 2022-10-02 LAB — MAGNESIUM: Magnesium: 2 mg/dL (ref 1.7–2.4)

## 2022-10-02 LAB — COMPREHENSIVE METABOLIC PANEL
ALT: 53 U/L — ABNORMAL HIGH (ref 0–44)
AST: 81 U/L — ABNORMAL HIGH (ref 15–41)
Albumin: 2.6 g/dL — ABNORMAL LOW (ref 3.5–5.0)
Alkaline Phosphatase: 109 U/L (ref 38–126)
Anion gap: 9 (ref 5–15)
BUN: 7 mg/dL (ref 6–20)
CO2: 21 mmol/L — ABNORMAL LOW (ref 22–32)
Calcium: 8.8 mg/dL — ABNORMAL LOW (ref 8.9–10.3)
Chloride: 100 mmol/L (ref 98–111)
Creatinine, Ser: 0.59 mg/dL — ABNORMAL LOW (ref 0.61–1.24)
GFR, Estimated: >60 mL/min (ref >60)
Glucose, Bld: 88 mg/dL (ref 70–99)
Potassium: 3.2 mmol/L — ABNORMAL LOW (ref 3.5–5.1)
Sodium: 130 mmol/L — ABNORMAL LOW (ref 135–145)
Total Bilirubin: 12.9 mg/dL — ABNORMAL HIGH (ref 0.3–1.2)
Total Protein: 7 g/dL (ref 6.5–8.1)

## 2022-10-02 MED ORDER — POTASSIUM CHLORIDE CRYS ER 20 MEQ PO TBCR
40.0000 meq | EXTENDED_RELEASE_TABLET | ORAL | Status: AC
  Administered 2022-10-02 (×2): 40 meq via ORAL
  Filled 2022-10-02 (×2): qty 2

## 2022-10-02 MED ORDER — PREDNISOLONE 5 MG PO TABS: 40.0000 mg | ORAL_TABLET | Freq: Every day | ORAL | 0 refills | Status: AC

## 2022-10-02 MED ORDER — FOLIC ACID 1 MG PO TABS: 1.0000 mg | ORAL_TABLET | Freq: Every day | ORAL | 0 refills | Status: DC

## 2022-10-02 NOTE — Discharge Summary (Signed)
Physician Discharge Summary  Richard [name] UKG:[id number] DOB: Mar 02, 1966 DOA: 09/28/2022  PCP: Charlott Rakes, MD  Admit date: 09/28/2022 Discharge date: 10/02/2022  Admitted From: Home Disposition: Home  Recommendations for Outpatient Follow-up:  Follow up with PCP in 1 week with repeat CBC/CMP Outpatient evaluation and follow-up by gastroenterology Comply with medications and follow-up Abstain from alcohol Follow up in ED if symptoms worsen or new appear   Home Health: No Equipment/Devices: None  Discharge Condition: Stable CODE STATUS: Full Diet recommendation: Heart healthy/fluid restriction of up to 1200 cc a day  Brief/Interim Summary: 56 y.o. male with medical history significant for alcoholism with chronic liver disease, colon cancer status post partial colectomy, history of alcoholic cardiomyopathy with recovered EF in January 2023, and recent admission with acute alcoholic hepatitis who returned to the emergency department with fatigue and loss of appetite. Patient was admitted to the hospital on 09/24/2022 with acute alcoholic hepatitis, was seen by GI and started on prednisolone, but left AMA on 09/25/2022.  He was restarted on oral prednisone.  During the hospitalization, his condition has remained stable.  Bilirubin is elevated but slowly coming down.  He is tolerating diet and wants to go home today.  He will be discharged home with follow-up with PCP and GI.  Discharge Diagnoses:   Acute alcoholic hepatitis/hepatic encephalopathy/liver cirrhosis Patient left AGAINST MEDICAL ADVICE on 12/24.  He was seen by gastroenterology during that hospitalization and was started on prednisolone. Alpha-fetoprotein level is normal. -Continue prednisolone for 28 days.  Outpatient follow-up with GI. -Continue lactulose for elevated ammonia level.  Mental status stable. -Bilirubin 12.9 today.  AST and ALT are still mildly elevated.  Outpatient follow-up of CMP.  Patient  tolerating diet and wants to go home today.  Discharge patient home today with close follow-up with PCP and GI. 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  Improved, INR 1.5 on 10/01/2022.  Received vitamin K during this hospitalization.   Hyponatremia/hypokalemia/hypophosphatemia/hypomagnesemia Sodium level is low but stable.  Likely due to liver disease. -Replace potassium prior to discharge.  Magnesium level is improved.     Alcohol abuse On CIWA protocol.  No evidence of withdrawal currently.  Patient counseled regarding his alcohol abuse.  He was told that he should not be drinking anymore.   Normocytic anemia No evidence for overt bleeding.  Outpatient follow-up.   Thrombocytopenia -Resolved.  Platelet counts are stable.   Ascites -Diuretics on hold currently.   Resume diuretics on discharge.  Outpatient follow-up with PCP/GI   previous history of colon cancer status post colectomy Stable.   Cholelithiasis Seems to be asymptomatic.  Discharge Instructions  Discharge Instructions     Ambulatory referral to Gastroenterology   Complete by: As directed    Hospital follow up   What is the reason for referral?: Other   Diet - low sodium heart healthy   Complete by: As directed    Increase activity slowly   Complete by: As directed       Allergies as of 10/02/2022       Reactions   Aspirin Other (See Comments)   Acid reflux    Aspirin Other (See Comments)   Caused acid reflux   Penicillins Hives   Has patient had a PCN reaction causing immediate rash, facial/tongue/throat swelling, SOB or lightheadedness with hypotension: yes Has patient had a PCN reaction causing severe rash involving mucus membranes or skin necrosis: no Has patient had a PCN reaction that required  hospitalization: no Has patient had a PCN reaction occurring within the last 10 years: no If all of the above answers are "NO", then may proceed with  Cephalosporin use.   Penicillins Hives        Medication List     STOP taking these medications    baclofen 10 MG tablet Commonly known as: LIORESAL   gabapentin 300 MG capsule Commonly known as: NEURONTIN   Hyoscyamine Sulfate SL 0.125 MG Subl   magnesium oxide 400 (241.3 Mg) MG tablet Commonly known as: MAG-OX   midodrine 5 MG tablet Commonly known as: PROAMATINE       TAKE these medications    aspirin EC 81 MG tablet Take 1 tablet (81 mg total) by mouth daily.   DULoxetine 60 MG capsule Commonly known as: CYMBALTA Take 1 capsule (60 mg total) by mouth daily.   ferrous gluconate 324 MG tablet Commonly known as: FERGON Take 1 tablet (324 mg total) by mouth daily with breakfast.   folic acid 1 MG tablet Commonly known as: FOLVITE Take 1 tablet (1 mg total) by mouth daily.   furosemide 20 MG tablet Commonly known as: LASIX TAKE 1 TABLET(20 MG) BY MOUTH DAILY   lactulose 10 GM/15ML solution Commonly known as: CHRONULAC TAKE 30 ML BY MOUTH TWICE DAILY   naloxone 4 MG/0.1ML Liqd nasal spray kit Commonly known as: NARCAN Place 1 spray into the nose as needed (accidental overdose).   oxyCODONE-acetaminophen 10-325 MG tablet Commonly known as: PERCOCET Take 1 tablet by mouth 4 (four) times daily as needed for pain.   pantoprazole 40 MG tablet Commonly known as: PROTONIX TAKE 1 TABLET(40 MG) BY MOUTH DAILY   prednisoLONE 5 MG Tabs tablet Take 8 tablets (40 mg total) by mouth daily for 25 days. Start taking on: October 03, 2022   spironolactone 100 MG tablet Commonly known as: ALDACTONE Take 1 tablet (100 mg total) by mouth daily.   thiamine 100 MG tablet Commonly known as: VITAMIN B1 Take 1 tablet (100 mg total) by mouth daily.   Vitamin D-3 125 MCG (5000 UT) Tabs Take 2 PO qd x 3 months, then 1 PO qd long-term after that        Follow-up Information     Charlott Rakes, MD. Schedule an appointment as soon as possible for a visit in 1  week(s).   Specialty: Family Medicine Why: with repeat CMP Contact information: [place] [address] [place] [address] [phone]                Allergies  Allergen Reactions   Aspirin Other (See Comments)    Acid reflux    Aspirin Other (See Comments)    Caused acid reflux   Penicillins Hives    Has patient had a PCN reaction causing immediate rash, facial/tongue/throat swelling, SOB or lightheadedness with hypotension: yes Has patient had a PCN reaction causing severe rash involving mucus membranes or skin necrosis: no Has patient had a PCN reaction that required hospitalization: no Has patient had a PCN reaction occurring within the last 10 years: no If all of the above answers are "NO", then may proceed with Cephalosporin use.    Penicillins Hives    Consultations: None   Procedures/Studies: CT CHEST ABDOMEN PELVIS WO CONTRAST  Result Date: 09/23/2022 CLINICAL DATA:  Patient presents for failure to thrive, lethargy, loss of appetite. Query sepsis EXAM: CT CHEST, ABDOMEN AND PELVIS WITHOUT CONTRAST TECHNIQUE: Multidetector CT imaging of the chest, abdomen and  pelvis was performed following the standard protocol without IV contrast. RADIATION DOSE REDUCTION: This exam was performed according to the departmental dose-optimization program which includes automated exposure control, adjustment of the mA and/or kV according to patient size and/or use of iterative reconstruction technique. COMPARISON:  CT 10/31/2021 and 08/31/2007 FINDINGS: CT CHEST FINDINGS Cardiovascular: Normal heart size. Coronary artery and aortic atherosclerotic calcification. No pericardial effusion. Mediastinum/Nodes: No enlarged mediastinal, hilar, or axillary lymph nodes. Thyroid gland, trachea, and esophagus demonstrate no significant findings. Lungs/Pleura: Paraseptal emphysema and bullous change in the lung apices. No focal consolidation, pleural effusion, or pneumothorax.  Musculoskeletal: No chest wall mass or suspicious bone lesions identified. CT ABDOMEN PELVIS FINDINGS Hepatobiliary: Redemonstrated geographic pattern of steatosis throughout the liver. Hepatomegaly. Nodular contour compatible with cirrhosis. The gallbladder is mildly thick-walled and distended with stone in the gallbladder neck. No definite biliary dilation. Pancreas: Unremarkable. No pancreatic ductal dilatation or surrounding inflammatory changes. Spleen: Normal in size without focal abnormality. Adrenals/Urinary Tract: Adrenal glands are unremarkable. Kidneys are normal, without renal calculi, focal lesion, or hydronephrosis. Bladder is unremarkable. Stomach/Bowel: Postoperative change of partial colectomy with anastomosis in the left lower quadrant. The transverse colon upstream from the anastomosis is patulous and there is mild wall thickening of the colon about the anastomosis. Normal caliber large and small bowel. Stomach is unremarkable. Vascular/Lymphatic: Aortic atherosclerosis. No enlarged abdominal or pelvic lymph nodes. Reproductive: Unremarkable. Other: Mesenteric edema. Small amount of perihepatic ascites. No free intraperitoneal air. Musculoskeletal: No acute osseous abnormality. IMPRESSION: 1. Similar geographic pattern of steatosis throughout the liver which is enlarged with mild nodularity of the contour compatible with cirrhosis. 2. Partial colectomy. There is mild wall thickening of the transverse colon upstream from the anastomosis. This is nonspecific and is favored secondary to congestive colopathy, less likely infectious/inflammatory colitis. 3. Diffuse mesenteric edema and small volume ascites. 4. Cholelithiasis. Gallbladder wall thickening is favored due to liver pathology though acute cholecystitis is not excluded. 5.  Aortic Atherosclerosis (ICD10-I70.0). Electronically Signed   By: Placido Sou M.D.   On: 09/23/2022 21:33   DG Chest 1 View  Result Date: 09/23/2022 CLINICAL  DATA:  Chest pain EXAM: CHEST  1 VIEW COMPARISON:  11/01/2021 FINDINGS: The heart size and mediastinal contours are within normal limits. Both lungs are clear. The visualized skeletal structures are unremarkable. IMPRESSION: No active disease. Electronically Signed   By: Misty Stanley M.D.   On: 09/23/2022 18:21      Subjective: Patient seen and examined at bedside.  Wants to go home.  Denies worsening abdominal pain, vomiting.  Has some diarrhea.  Discharge Exam: Vitals:   10/01/22 2042 10/02/22 0444  BP: (!) 118/59 135/79  Pulse: 65 64  Resp: 16 15  Temp: 98.1 F (36.7 C) 98.1 F (36.7 C)  SpO2: 100% 100%    General: Pt is alert, awake, not in acute distress.  Looks chronically ill and deconditioned.  Icterus positive Cardiovascular: rate controlled, S1/S2 + Respiratory: bilateral decreased breath sounds at bases Abdominal: Soft, NT, distended; bowel sounds + Extremities: Trace lower extremity edema bilaterally; no cyanosis    The results of significant diagnostics from this hospitalization (including imaging, microbiology, ancillary and laboratory) are listed below for reference.     Microbiology: Recent Results (from the past 240 hour(s))  Urine Culture     Status: Abnormal   Collection Time: 09/23/22  1:46 AM   Specimen: Urine, Clean Catch  Result Value Ref Range Status   Specimen Description   Final  URINE, CLEAN CATCH Performed at [facility], [place] [address], [place], [place] [address]    Special Requests   Final    NONE Performed at [facility], [place] [address], [place], [place] [address]    Culture (A)  Final    <10,000 COLONIES/mL INSIGNIFICANT GROWTH Performed at [place] [address], [place], [place] [address]    Report Status 09/25/2022 FINAL  Final  Resp panel by RT-PCR (RSV, Flu A&B, Covid) Anterior Nasal Swab     Status: None   Collection Time: 09/23/22  5:51 PM   Specimen: Anterior Nasal Swab   Result Value Ref Range Status   SARS Coronavirus 2 by RT PCR NEGATIVE NEGATIVE Final    Comment: (NOTE) SARS-CoV-2 target nucleic acids are NOT DETECTED.  The SARS-CoV-2 RNA is generally detectable in upper respiratory specimens during the acute phase of infection. The lowest concentration of SARS-CoV-2 viral copies this assay can detect is 138 copies/mL. A negative result does not preclude SARS-Cov-2 infection and should not be used as the sole basis for treatment or other patient management decisions. A negative result may occur with  improper specimen collection/handling, submission of specimen other than nasopharyngeal swab, presence of viral mutation(s) within the areas targeted by this assay, and inadequate number of viral copies(<138 copies/mL). A negative result must be combined with clinical observations, patient history, and epidemiological information. The expected result is Negative.  Fact Sheet for Patients:  EntrepreneurPulse.com.au  Fact Sheet for Healthcare Providers:  IncredibleEmployment.be  This test is no t yet approved or cleared by the [place] FDA and  has been authorized for detection and/or diagnosis of SARS-CoV-2 by FDA under an Emergency Use Authorization (EUA). This EUA will remain  in effect (meaning this test can be used) for the duration of the COVID-19 declaration under Section 564(b)(1) of the Act, 21 U.S.C.section 360bbb-3(b)(1), unless the authorization is terminated  or revoked sooner.       Influenza A by PCR NEGATIVE NEGATIVE Final   Influenza B by PCR NEGATIVE NEGATIVE Final    Comment: (NOTE) The Xpert Xpress SARS-CoV-2/FLU/RSV plus assay is intended as an aid in the diagnosis of influenza from Nasopharyngeal swab specimens and should not be used as a sole basis for treatment. Nasal washings and aspirates are unacceptable for Xpert Xpress SARS-CoV-2/FLU/RSV testing.  Fact Sheet for  Patients: EntrepreneurPulse.com.au  Fact Sheet for Healthcare Providers: IncredibleEmployment.be  This test is not yet approved or cleared by the [place] FDA and has been authorized for detection and/or diagnosis of SARS-CoV-2 by FDA under an Emergency Use Authorization (EUA). This EUA will remain in effect (meaning this test can be used) for the duration of the COVID-19 declaration under Section 564(b)(1) of the Act, 21 U.S.C. section 360bbb-3(b)(1), unless the authorization is terminated or revoked.     Resp Syncytial Virus by PCR NEGATIVE NEGATIVE Final    Comment: (NOTE) Fact Sheet for Patients: EntrepreneurPulse.com.au  Fact Sheet for Healthcare Providers: IncredibleEmployment.be  This test is not yet approved or cleared by the [place] FDA and has been authorized for detection and/or diagnosis of SARS-CoV-2 by FDA under an Emergency Use Authorization (EUA). This EUA will remain in effect (meaning this test can be used) for the duration of the COVID-19 declaration under Section 564(b)(1) of the Act, 21 U.S.C. section 360bbb-3(b)(1), unless the authorization is terminated or revoked.  Performed at [facility], [place] [address] [place], [place] [address]   Culture,  blood (routine x 2)     Status: None   Collection Time: 09/23/22 11:26 PM   Specimen: BLOOD  Result Value Ref Range Status   Specimen Description   Final    BLOOD RIGHT ANTECUBITAL Performed at [place] [address], [place], [place] [address]    Special Requests   Final    BOTTLES DRAWN AEROBIC AND ANAEROBIC Blood Culture results may not be optimal due to an excessive volume of blood received in culture bottles Performed at [place] [address], [place], [place] [address]    Culture   Final    NO GROWTH 5 DAYS Performed at [facility], [place] [address], [place], [place] [address]    Report Status 09/29/2022 FINAL  Final  Culture, blood (routine x 2)     Status: None   Collection Time: 09/24/22  6:08 AM   Specimen: BLOOD LEFT ARM  Result Value Ref Range Status   Specimen Description   Final    BLOOD LEFT ARM Performed at [facility], [place] [address] [place], [place] [address]    Special Requests   Final    BOTTLES DRAWN AEROBIC ONLY Blood Culture adequate volume Performed at [place] [address] [place], [place] [address]    Culture   Final    NO GROWTH 5 DAYS Performed at [facility], [place] [address] [place], [place] [address]    Report Status 09/29/2022 FINAL  Final     Labs: BNP (last 3 results) No results for input(s): "BNP" in the last 8760 hours. Basic Metabolic Panel: Recent Labs  Lab 09/28/22 1354 09/29/22 0757 09/30/22 0640 10/01/22 0810 10/02/22 0753  NA 129* 130* 129* 131* 130*  K 2.9* 3.1* 3.6 3.4* 3.2*  CL 95* 101 102 101 100  CO2 20* 20* 18* 20* 21*  GLUCOSE 85 91 115* 107* 88  BUN 7 6 <5* 6 7  CREATININE 0.39* 0.49* 0.52* 0.50* 0.59*  CALCIUM 9.0 8.3* 8.5* 8.9 8.8*  MG 1.2* 1.8 2.1 1.6* 2.0  PHOS  --  1.5*  --  2.0*  --    Liver Function Tests: Recent Labs  Lab 09/28/22 1354 09/29/22 0757 09/30/22 0640 10/01/22 0810 10/02/22 0753  AST 131* 97* 84* 92* 81*  ALT 65* 52* 51* 55* 53*  ALKPHOS 95 76 90 104 109  BILITOT 17.5* 15.0* 12.9* 14.3* 12.9*  PROT 7.8 6.3* 6.6 7.6 7.0  ALBUMIN 2.9* 2.3* 2.4* 2.7* 2.6*   Recent Labs  Lab 09/28/22 1354  LIPASE 42   Recent Labs  Lab 09/28/22 1356 09/30/22 0640  AMMONIA 59* 65*   CBC: Recent Labs  Lab 09/28/22 1354 09/29/22 0757 09/30/22 0640 10/01/22 0810 10/02/22 0753  WBC 9.5 7.3 7.9 10.4 10.9*  NEUTROABS 7.1  --   --   --  7.6  HGB 8.1* 7.4* 7.4* 9.0* 8.7*  HCT 24.4* 22.7* 22.7* 27.7* 26.4*  MCV 85.3 86.6 88.3 88.8 86.8  PLT 129* 138* 167 248 259   Cardiac Enzymes: No results for  input(s): "CKTOTAL", "CKMB", "CKMBINDEX", "TROPONINI" in the last 168 hours. BNP: Invalid input(s): "POCBNP" CBG: No results for input(s): "GLUCAP" in the last 168 hours. D-Dimer No results for input(s): "DDIMER" in the last 72 hours. Hgb A1c No results for input(s): "HGBA1C" in the last 72 hours. Lipid Profile No results for input(s): "CHOL", "HDL", "LDLCALC", "TRIG", "CHOLHDL", "LDLDIRECT" in the last 72 hours. Thyroid  function studies No results for input(s): "TSH", "T4TOTAL", "T3FREE", "THYROIDAB" in the last 72 hours.  Invalid input(s): "FREET3" Anemia work up No results for input(s): "VITAMINB12", "FOLATE", "FERRITIN", "TIBC", "IRON", "RETICCTPCT" in the last 72 hours. Urinalysis    Component Value Date/Time   COLORURINE AMBER (A) 09/23/2022 1421   APPEARANCEUR CLEAR 09/23/2022 1421   LABSPEC 1.017 09/23/2022 1421   PHURINE 5.0 09/23/2022 1421   GLUCOSEU NEGATIVE 09/23/2022 1421   HGBUR NEGATIVE 09/23/2022 1421   BILIRUBINUR SMALL (A) 09/23/2022 1421   KETONESUR 80 (A) 09/23/2022 1421   PROTEINUR 30 (A) 09/23/2022 1421   UROBILINOGEN 0.2 01/13/2009 1011   NITRITE NEGATIVE 09/23/2022 1421   LEUKOCYTESUR NEGATIVE 09/23/2022 1421   Sepsis Labs Recent Labs  Lab 09/29/22 0757 09/30/22 0640 10/01/22 0810 10/02/22 0753  WBC 7.3 7.9 10.4 10.9*   Microbiology Recent Results (from the past 240 hour(s))  Urine Culture     Status: Abnormal   Collection Time: 09/23/22  1:46 AM   Specimen: Urine, Clean Catch  Result Value Ref Range Status   Specimen Description   Final    URINE, CLEAN CATCH Performed at [facility], [place] [address], [place], [place] [address]    Special Requests   Final    NONE Performed at [facility], [place] [address], [place], [place] [address]    Culture (A)  Final    <10,000 COLONIES/mL INSIGNIFICANT GROWTH Performed at [place] [address] [place], [place] [address]    Report Status  09/25/2022 FINAL  Final  Resp panel by RT-PCR (RSV, Flu A&B, Covid) Anterior Nasal Swab     Status: None   Collection Time: 09/23/22  5:51 PM   Specimen: Anterior Nasal Swab  Result Value Ref Range Status   SARS Coronavirus 2 by RT PCR NEGATIVE NEGATIVE Final    Comment: (NOTE) SARS-CoV-2 target nucleic acids are NOT DETECTED.  The SARS-CoV-2 RNA is generally detectable in upper respiratory specimens during the acute phase of infection. The lowest concentration of SARS-CoV-2 viral copies this assay can detect is 138 copies/mL. A negative result does not preclude SARS-Cov-2 infection and should not be used as the sole basis for treatment or other patient management decisions. A negative result may occur with  improper specimen collection/handling, submission of specimen other than nasopharyngeal swab, presence of viral mutation(s) within the areas targeted by this assay, and inadequate number of viral copies(<138 copies/mL). A negative result must be combined with clinical observations, patient history, and epidemiological information. The expected result is Negative.  Fact Sheet for Patients:  EntrepreneurPulse.com.au  Fact Sheet for Healthcare Providers:  IncredibleEmployment.be  This test is no t yet approved or cleared by the [place] FDA and  has been authorized for detection and/or diagnosis of SARS-CoV-2 by FDA under an Emergency Use Authorization (EUA). This EUA will remain  in effect (meaning this test can be used) for the duration of the COVID-19 declaration under Section 564(b)(1) of the Act, 21 U.S.C.section 360bbb-3(b)(1), unless the authorization is terminated  or revoked sooner.       Influenza A by PCR NEGATIVE NEGATIVE Final   Influenza B by PCR NEGATIVE NEGATIVE Final    Comment: (NOTE) The Xpert Xpress SARS-CoV-2/FLU/RSV plus assay is intended as an aid in the diagnosis of influenza from Nasopharyngeal swab specimens  and should not be used as a sole basis for treatment. Nasal washings and aspirates are unacceptable for Xpert Xpress SARS-CoV-2/FLU/RSV testing.  Fact Sheet for  Patients: EntrepreneurPulse.com.au  Fact Sheet for Healthcare Providers: IncredibleEmployment.be  This test is not yet approved or cleared by the [place] FDA and has been authorized for detection and/or diagnosis of SARS-CoV-2 by FDA under an Emergency Use Authorization (EUA). This EUA will remain in effect (meaning this test can be used) for the duration of the COVID-19 declaration under Section 564(b)(1) of the Act, 21 U.S.C. section 360bbb-3(b)(1), unless the authorization is terminated or revoked.     Resp Syncytial Virus by PCR NEGATIVE NEGATIVE Final    Comment: (NOTE) Fact Sheet for Patients: EntrepreneurPulse.com.au  Fact Sheet for Healthcare Providers: IncredibleEmployment.be  This test is not yet approved or cleared by the [place] FDA and has been authorized for detection and/or diagnosis of SARS-CoV-2 by FDA under an Emergency Use Authorization (EUA). This EUA will remain in effect (meaning this test can be used) for the duration of the COVID-19 declaration under Section 564(b)(1) of the Act, 21 U.S.C. section 360bbb-3(b)(1), unless the authorization is terminated or revoked.  Performed at [facility], [place] [address], [place], [place] [address]   Culture, blood (routine x 2)     Status: None   Collection Time: 09/23/22 11:26 PM   Specimen: BLOOD  Result Value Ref Range Status   Specimen Description   Final    BLOOD RIGHT ANTECUBITAL Performed at [place] [address], [place], [place] [address]    Special Requests   Final    BOTTLES DRAWN AEROBIC AND ANAEROBIC Blood Culture results may not be optimal due to an excessive volume of blood received in culture bottles Performed  at [place] [address], [place], [place] [address]    Culture   Final    NO GROWTH 5 DAYS Performed at [facility], [place] [address], [place], [place] [address]    Report Status 09/29/2022 FINAL  Final  Culture, blood (routine x 2)     Status: None   Collection Time: 09/24/22  6:08 AM   Specimen: BLOOD LEFT ARM  Result Value Ref Range Status   Specimen Description   Final    BLOOD LEFT ARM Performed at [facility], [place] [address] [place], [place] [address]    Special Requests   Final    BOTTLES DRAWN AEROBIC ONLY Blood Culture adequate volume Performed at [place] [address] [place], [place] [address]    Culture   Final    NO GROWTH 5 DAYS Performed at [facility], [place] [address] [place],  [address]    Report Status 09/29/2022 FINAL  Final     Time coordinating discharge: 35 minutes  SIGNED:   Aline August, MD  [facility] 10/02/2022, 10:42 AM

## 2022-10-02 NOTE — Plan of Care (Signed)
Patient AOX4, VSS throughout shift.  All meds given on time as ordered.  Tmax 38.2 C, PRN tylenol given for relief.  PCA pump on-going.  Diminished lungs, IS encouraged.  Pt now on 2LNC d/t desat of O2.  Purewick in place.  POC maintained, will continue to monitor.  Problem: Education: Goal: Knowledge of General Education information will improve Description: Including pain rating scale, medication(s)/side effects and non-pharmacologic comfort measures Outcome: Progressing   Problem: Health Behavior/Discharge Planning: Goal: Ability to manage health-related needs will improve Outcome: Progressing   Problem: Clinical Measurements: Goal: Ability to maintain clinical measurements within normal limits will improve Outcome: Progressing Goal: Will remain free from infection Outcome: Progressing Goal: Diagnostic test results will improve Outcome: Progressing Goal: Respiratory complications will improve Outcome: Progressing Goal: Cardiovascular complication will be avoided Outcome: Progressing   Problem: Activity: Goal: Risk for activity intolerance will decrease Outcome: Progressing   Problem: Nutrition: Goal: Adequate nutrition will be maintained Outcome: Progressing   Problem: Coping: Goal: Level of anxiety will decrease Outcome: Progressing   Problem: Elimination: Goal: Will not experience complications related to bowel motility Outcome: Progressing Goal: Will not experience complications related to urinary retention Outcome: Progressing   Problem: Pain Managment: Goal: General experience of comfort will improve Outcome: Progressing   Problem: Safety: Goal: Ability to remain free from injury will improve Outcome: Progressing   Problem: Skin Integrity: Goal: Risk for impaired skin integrity will decrease Outcome: Progressing

## 2022-10-02 NOTE — Plan of Care (Signed)
Patient noted sitting in chair awaiting ride for discharge. IV removed and area secured. Discharge instructions given and pt verbalized understanding. Transport down via wheelchair with assistance.

## 2022-10-02 NOTE — Plan of Care (Signed)
Patient sitting in room awaiting discharge. Discharge instructions given and pt verbalized understanding. All belongings with pt. Pt IV removed and will be discharged home with family member in stable condition.

## 2022-10-04 ENCOUNTER — Telehealth: Payer: Self-pay

## 2022-10-04 NOTE — Telephone Encounter (Signed)
Transition Care Management Unsuccessful Follow-up Telephone Call  Date of discharge and from where:  10/02/22- [facility]  Attempts:  1st Attempt  Reason for unsuccessful TCM follow-up call:  Unable to reach patient [phone] - the recording stated that the call cannot be completed at this time

## 2022-10-05 ENCOUNTER — Telehealth: Payer: Self-pay

## 2022-10-05 NOTE — Telephone Encounter (Signed)
Transition Care Management Unsuccessful Follow-up Telephone Call  Date of discharge and from where:  10/02/2022, [facility]  Attempts:  2nd Attempt  Reason for unsuccessful TCM follow-up call:  Unable to reach patient - [phone] - the recording stated that the call cannot be completed at this time

## 2022-10-06 ENCOUNTER — Telehealth: Payer: Self-pay

## 2022-10-06 NOTE — Telephone Encounter (Signed)
Transition Care Management Unsuccessful Follow-up Telephone Call  Date of discharge and from where:  10/02/2022, [facility]  Attempts:  3rd Attempt  Reason for unsuccessful TCM follow-up call:  Unable to reach patient - [phone] - the recording stated that the call cannot be completed at this time         The patient has an appointment at [facility] with Dr Margarita Rana - 10/26/2022.

## 2022-10-07 ENCOUNTER — Other Ambulatory Visit: Payer: Self-pay

## 2022-10-07 ENCOUNTER — Emergency Department (HOSPITAL_COMMUNITY)
Admission: EM | Admit: 2022-10-07 | Discharge: 2022-10-08 | Disposition: A | Discharge status: Home / Self Care | Payer: Medicaid Other | Attending: Emergency Medicine | Admitting: Emergency Medicine

## 2022-10-07 ENCOUNTER — Encounter (HOSPITAL_COMMUNITY): Payer: Self-pay

## 2022-10-07 DIAGNOSIS — G629 Polyneuropathy, unspecified: Secondary | ICD-10-CM | POA: Diagnosis not present

## 2022-10-07 DIAGNOSIS — R6889 Other general symptoms and signs: Secondary | ICD-10-CM | POA: Diagnosis not present

## 2022-10-07 DIAGNOSIS — G9009 Other idiopathic peripheral autonomic neuropathy: Secondary | ICD-10-CM | POA: Diagnosis present

## 2022-10-07 DIAGNOSIS — Z7982 Long term (current) use of aspirin: Secondary | ICD-10-CM | POA: Insufficient documentation

## 2022-10-07 DIAGNOSIS — R9431 Abnormal electrocardiogram [ECG] [EKG]: Secondary | ICD-10-CM | POA: Diagnosis not present

## 2022-10-07 DIAGNOSIS — E876 Hypokalemia: Secondary | ICD-10-CM

## 2022-10-07 HISTORY — DX: Polyneuropathy, unspecified: G62.9

## 2022-10-07 LAB — CBC WITH DIFFERENTIAL/PLATELET
Abs Immature Granulocytes: 0.05 10*3/uL (ref 0.00–0.07)
Basophils Absolute: 0.1 10*3/uL (ref 0.0–0.1)
Basophils Relative: 1 %
Eosinophils Absolute: 0.2 10*3/uL (ref 0.0–0.5)
Eosinophils Relative: 2 %
HCT: 21.9 % — ABNORMAL LOW (ref 39.0–52.0)
Hemoglobin: 7.3 g/dL — ABNORMAL LOW (ref 13.0–17.0)
Immature Granulocytes: 1 %
Lymphocytes Relative: 9 %
Lymphs Abs: 0.9 10*3/uL (ref 0.7–4.0)
MCH: 29.2 pg (ref 26.0–34.0)
MCHC: 33.3 g/dL (ref 30.0–36.0)
MCV: 87.6 fL (ref 80.0–100.0)
Monocytes Absolute: 1.1 10*3/uL — ABNORMAL HIGH (ref 0.1–1.0)
Monocytes Relative: 11 %
Neutro Abs: 7.5 10*3/uL (ref 1.7–7.7)
Neutrophils Relative %: 76 %
Platelets: 183 10*3/uL (ref 150–400)
RBC: 2.5 MIL/uL — ABNORMAL LOW (ref 4.22–5.81)
RDW: 27.1 % — ABNORMAL HIGH (ref 11.5–15.5)
WBC: 9.8 10*3/uL (ref 4.0–10.5)
nRBC: 0 % (ref 0.0–0.2)

## 2022-10-07 LAB — COMPREHENSIVE METABOLIC PANEL
ALT: 38 U/L (ref 0–44)
AST: 63 U/L — ABNORMAL HIGH (ref 15–41)
Albumin: 1.9 g/dL — ABNORMAL LOW (ref 3.5–5.0)
Alkaline Phosphatase: 107 U/L (ref 38–126)
Anion gap: 9 (ref 5–15)
BUN: 7 mg/dL (ref 6–20)
CO2: 20 mmol/L — ABNORMAL LOW (ref 22–32)
Calcium: 8.1 mg/dL — ABNORMAL LOW (ref 8.9–10.3)
Chloride: 99 mmol/L (ref 98–111)
Creatinine, Ser: 0.71 mg/dL (ref 0.61–1.24)
GFR, Estimated: >60 mL/min (ref >60)
Glucose, Bld: 106 mg/dL — ABNORMAL HIGH (ref 70–99)
Potassium: 2.9 mmol/L — ABNORMAL LOW (ref 3.5–5.1)
Sodium: 128 mmol/L — ABNORMAL LOW (ref 135–145)
Total Bilirubin: 10.5 mg/dL — ABNORMAL HIGH (ref 0.3–1.2)
Total Protein: 5.9 g/dL — ABNORMAL LOW (ref 6.5–8.1)

## 2022-10-07 LAB — MAGNESIUM: Magnesium: 1.4 mg/dL — ABNORMAL LOW (ref 1.7–2.4)

## 2022-10-07 NOTE — ED Provider Notes (Signed)
[facility] DEPT Provider Note   CSN: [id number] Arrival date & time: 10/07/22  2146     History  Chief Complaint  Patient presents with   Peripheral Neuropathy    Richard [name] is a 57 y.o. male with a PMHx of peripheral neuropathy who presents to the ED with concerns for peripheral neuropathy. Notes that it is localized to his bilateral hands and feet. Notes history of similar symptoms that he takes gabapentin for. He has been compliant with these medications for the most part and notes that he may have missed some doses of his medication.  Notes that his pain became worse when the weather became colder in October. Symptom alleviated with heat. No recent injury, trauma, fall. He does have a PCP with a follow-up appointment on 10/22/2021. Denies hemoptysis, hematemesis, hematochezia, melena, chest pain, shortness of breath, abdominal pain, nausea, vomiting.    The history is provided by the patient. No language interpreter was used.       Home Medications Prior to Admission medications   Medication Sig Start Date End Date Taking? Authorizing Provider  magnesium chloride (SLOW-MAG) 64 MG TBEC SR tablet Take 2 tablets (128 mg total) by mouth daily. 10/08/22  Yes Kimiko Common A, PA-C  potassium chloride SA (KLOR-CON M) 20 MEQ tablet Take 1 tablet (20 mEq total) by mouth 2 (two) times daily for 5 days. 10/08/22 10/13/22 Yes Peggy Monk A, PA-C  aspirin 81 MG EC tablet Take 1 tablet (81 mg total) by mouth daily. 06/16/20   Fulp, Cammie, MD  Cholecalciferol (VITAMIN D-3) 125 MCG (5000 UT) TABS Take 2 PO qd x 3 months, then 1 PO qd long-term after that 11/11/20   Hilts, Michael, MD  DULoxetine (CYMBALTA) 60 MG capsule Take 1 capsule (60 mg total) by mouth daily. 11/09/21 11/09/22  Charlott Rakes, MD  ferrous gluconate (FERGON) 324 MG tablet Take 1 tablet (324 mg total) by mouth daily with breakfast. Patient not taking: Reported on 05/31/2022 11/11/20   Hilts, Legrand Como,  MD  folic acid (FOLVITE) 1 MG tablet Take 1 tablet (1 mg total) by mouth daily. 10/02/22   Aline August, MD  furosemide (LASIX) 20 MG tablet TAKE 1 TABLET(20 MG) BY MOUTH DAILY 08/09/22   Charlott Rakes, MD  lactulose (CHRONULAC) 10 GM/15ML solution TAKE 30 ML BY MOUTH TWICE DAILY Patient not taking: Reported on 09/24/2022 08/30/22   Charlott Rakes, MD  naloxone [facility]) nasal spray 4 mg/0.1 mL Place 1 spray into the nose as needed (accidental overdose). 07/06/22   [provider]  oxyCODONE-acetaminophen (PERCOCET) 10-325 MG tablet Take 1 tablet by mouth 4 (four) times daily as needed for pain. 08/26/22   [provider]  pantoprazole (PROTONIX) 40 MG tablet TAKE 1 TABLET(40 MG) BY MOUTH DAILY 08/09/22   Charlott Rakes, MD  prednisoLONE 5 MG TABS tablet Take 8 tablets (40 mg total) by mouth daily for 25 days. 10/03/22 10/28/22  Aline August, MD  spironolactone (ALDACTONE) 100 MG tablet Take 1 tablet (100 mg total) by mouth daily. 11/05/21 09/29/22  Kathie Dike, MD  thiamine 100 MG tablet Take 1 tablet (100 mg total) by mouth daily. 11/06/21   Kathie Dike, MD      Allergies    Aspirin, Aspirin, Penicillins, and Penicillins    Review of Systems   Review of Systems  All other systems reviewed and are negative.   Physical Exam Updated Vital Signs BP 125/68   Pulse 67   Temp 98.6 F (37  C) (Oral)   Resp (!) 21   Ht 6' (1.829 m)   Wt 50 kg   SpO2 96%   BMI 14.95 kg/m  Physical Exam Vitals and nursing note reviewed.  Constitutional:      General: He is not in acute distress.    Appearance: He is not diaphoretic.  HENT:     Head: Normocephalic and atraumatic.     Mouth/Throat:     Pharynx: No oropharyngeal exudate.  Eyes:     General: No scleral icterus.    Conjunctiva/sclera: Conjunctivae normal.  Cardiovascular:     Rate and Rhythm: Normal rate and regular rhythm.     Pulses: Normal pulses.     Heart sounds: Normal heart sounds.  Pulmonary:      Effort: Pulmonary effort is normal. No respiratory distress.     Breath sounds: Normal breath sounds. No wheezing.  Abdominal:     General: Bowel sounds are normal.     Palpations: Abdomen is soft. There is no mass.     Tenderness: There is no abdominal tenderness. There is no guarding or rebound.  Musculoskeletal:        General: Normal range of motion.     Cervical back: Normal range of motion and neck supple.  Skin:    General: Skin is warm and dry.  Neurological:     Mental Status: He is alert.  Psychiatric:        Behavior: Behavior normal.     ED Results / Procedures / Treatments   Labs (all labs ordered are listed, but only abnormal results are displayed) Labs Reviewed  COMPREHENSIVE METABOLIC PANEL - Abnormal; Notable for the following components:      Result Value   Sodium 128 (*)    Potassium 2.9 (*)    CO2 20 (*)    Glucose, Bld 106 (*)    Calcium 8.1 (*)    Total Protein 5.9 (*)    Albumin 1.9 (*)    AST 63 (*)    Total Bilirubin 10.5 (*)    All other components within normal limits  CBC WITH DIFFERENTIAL/PLATELET - Abnormal; Notable for the following components:   RBC 2.50 (*)    Hemoglobin 7.3 (*)    HCT 21.9 (*)    RDW 27.1 (*)    Monocytes Absolute 1.1 (*)    All other components within normal limits  MAGNESIUM - Abnormal; Notable for the following components:   Magnesium 1.4 (*)    All other components within normal limits    EKG None  Radiology No results found.  Procedures Procedures    Medications Ordered in ED Medications  potassium chloride SA (KLOR-CON M) CR tablet 40 mEq (40 mEq Oral Given 10/08/22 0052)  oxyCODONE-acetaminophen (PERCOCET/ROXICET) 5-325 MG per tablet 1 tablet (1 tablet Oral Given 10/08/22 0053)  magnesium chloride (SLOW-MAG) 64 MG SR tablet 64 mg (64 mg Oral Given 10/08/22 0112)    ED Course/ Medical Decision Making/ A&P Clinical Course as of 10/09/22 0052  Fri Oct 07, 2022  2331 Discussed with patient regarding labs  and patient denies hemoptysis, hematemesis, hematochezia, melena. [SB]  Sat Oct 08, 2022  0035 In depth conversation with patient regarding labs and plans to stay for treatment. At this time, patient notes that he would like to stay for the pain pill only. Pt notes that he doesn't want to stay due to the incoming inclement weather.  He is agreeable to p.o. potassium and magnesium. [SB]  0106  Discussed with patient in depth that he would need to follow up with his primary care for repeat labs. Pt agreeable at this time. Pt appears safe for discharge at this time.  [SB]    Clinical Course User Index [SB] Takirah Binford A, PA-C                           Medical Decision Making Amount and/or Complexity of Data Reviewed Labs: ordered.  Risk OTC drugs. Prescription drug management.   Pt presents with concerns for peripheral neuropathy to his bilateral hands and feet.  This has been ongoing for a year and worsening since October 2023.  Patient takes gabapentin however has had some issues with compliance recently.  Has a follow-up appointment with his primary care provider in 10/26/2022. Vital signs, patient afebrile. On exam, pt with no acute cardiovascular or respiratory exam findings. Differential diagnosis includes electrolyte abnormality, neuropathy.    Labs:  I ordered, and personally interpreted labs.  The pertinent results include:   CBC with hemoglobin at 7.3 otherwise no leukocytosis. Magnesium at 1.4 CMP with decreased sodium at 128, potassium at 2.9, total bilirubin at 10.5 however improved from previous values   Medications:  I ordered medication including magnesium, Percocet, potassium for repletion and pain management I have reviewed the patients home medicines and have made adjustments as needed    Disposition: Presentation notable for acute on chronic exacerbation of peripheral neuropathy.  Also notable for hypokalemia and hypomagnesia.  Patient denies recent EtOH use.   Denies history of DTs or withdrawal seizures.  In-depth conversation held with patient regarding need to stay to receive treatment with IV repletion of potassium and magnesium as well as fluids.  Patient declines at this time due to the inclement weather and wanting to not miss his ride.  Patient only voicing concerns at this time for pain medication due to the chronic burning sensation to his bilateral hands and feet.  See ED course for further discussion on this.  After consideration of the diagnostic results and the patients response to treatment, I feel that the patient would benefit from Discharge home.  Patient provided with prescription for magnesium and potassium and instructed to strictly follow-up with his primary care provider for repeat labs in 1 week.  Supportive care measures and strict return precautions discussed with patient at bedside. Pt acknowledges and verbalizes understanding. Pt appears safe for discharge. Follow up as indicated in discharge paperwork.    This chart was dictated using voice recognition software, Dragon. Despite the best efforts of this provider to proofread and correct errors, errors may still occur which can change documentation meaning.   Final Clinical Impression(s) / ED Diagnoses Final diagnoses:  Neuropathy  Hypokalemia  Hypomagnesemia    Rx / DC Orders ED Discharge Orders          Ordered    potassium chloride SA (KLOR-CON M) 20 MEQ tablet  2 times daily        10/08/22 0121    magnesium chloride (SLOW-MAG) 64 MG TBEC SR tablet  Daily        10/08/22 0121              Jameika Kinn A, PA-C 10/09/22 0055    Sherwood Gambler, MD 10/09/22 1620

## 2022-10-07 NOTE — ED Triage Notes (Signed)
From home, coming for pain in his hands and feet, has a hx of neopathy is taking gabapentin for the pain. States that the pain has gotten worse since October when the weather turned cold. Also states that it relieves with heat. No other complaints at this time.

## 2022-10-08 MED ORDER — POTASSIUM CHLORIDE CRYS ER 20 MEQ PO TBCR
40.0000 meq | EXTENDED_RELEASE_TABLET | Freq: Once | ORAL | Status: AC
  Administered 2022-10-08: 40 meq via ORAL
  Filled 2022-10-08: qty 2

## 2022-10-08 MED ORDER — MAGNESIUM CHLORIDE 64 MG PO TBEC
1.0000 | DELAYED_RELEASE_TABLET | Freq: Once | ORAL | Status: AC
  Administered 2022-10-08: 64 mg via ORAL
  Filled 2022-10-08: qty 1

## 2022-10-08 MED ORDER — MAGNESIUM CHLORIDE 64 MG PO TBEC: 2.0000 | DELAYED_RELEASE_TABLET | Freq: Every day | ORAL | 0 refills | Status: DC

## 2022-10-08 MED ORDER — POTASSIUM CHLORIDE 10 MEQ/100ML IV SOLN
10.0000 meq | INTRAVENOUS | Status: DC
  Filled 2022-10-08: qty 100

## 2022-10-08 MED ORDER — POTASSIUM CHLORIDE CRYS ER 20 MEQ PO TBCR: 20.0000 meq | EXTENDED_RELEASE_TABLET | Freq: Two times a day (BID) | ORAL | 0 refills | Status: DC

## 2022-10-08 MED ORDER — OXYCODONE-ACETAMINOPHEN 5-325 MG PO TABS
1.0000 | ORAL_TABLET | Freq: Once | ORAL | Status: AC
  Administered 2022-10-08: 1 via ORAL
  Filled 2022-10-08: qty 1

## 2022-10-08 MED ORDER — MAGNESIUM SULFATE 2 GM/50ML IV SOLN
2.0000 g | Freq: Once | INTRAVENOUS | Status: DC
  Filled 2022-10-08: qty 50

## 2022-10-08 NOTE — Discharge Instructions (Addendum)
It was a pleasure taking care of you today!   Your labs showed low hemoglobin, potassium, and magnesium tonight. Continue taking your gabapentin as prescribed. Maintain your scheduled follow up appointment with your primary care provider on 10/25/22. Your low potassium and magnesium was treated in the ED. You will be sent home with a prescription for potassium and magnesium. The magnesium may cause diarrhea, ensure to maintain fluid intake while taking this medication. You may consume water, pedialyte, or gatorade to aid with this. Take the magnesium as prescribed until your appointment with your primary care provider on 10/26/22. Attached is information for potassium/magnesium rich foods. Follow up with your primary care provider in 1 week for potassium and magnesium recheck. Return to the ED if you are experiencing increasing/worsening symptoms.

## 2022-10-26 ENCOUNTER — Ambulatory Visit: Payer: Medicaid Other | Attending: Family Medicine | Admitting: Family Medicine

## 2022-10-26 ENCOUNTER — Encounter: Payer: Self-pay | Admitting: Family Medicine

## 2022-10-26 VITALS — BP 120/74 | HR 87 | Ht 72.0 in | Wt 114.8 lb

## 2022-10-26 DIAGNOSIS — M19041 Primary osteoarthritis, right hand: Secondary | ICD-10-CM

## 2022-10-26 DIAGNOSIS — G621 Alcoholic polyneuropathy: Secondary | ICD-10-CM

## 2022-10-26 DIAGNOSIS — D638 Anemia in other chronic diseases classified elsewhere: Secondary | ICD-10-CM | POA: Diagnosis not present

## 2022-10-26 DIAGNOSIS — K7030 Alcoholic cirrhosis of liver without ascites: Secondary | ICD-10-CM | POA: Diagnosis not present

## 2022-10-26 DIAGNOSIS — K7010 Alcoholic hepatitis without ascites: Secondary | ICD-10-CM

## 2022-10-26 DIAGNOSIS — M19042 Primary osteoarthritis, left hand: Secondary | ICD-10-CM | POA: Diagnosis not present

## 2022-10-26 NOTE — Progress Notes (Signed)
Subjective:  Patient ID: Richard [name], male    DOB: [birth year]  Age: 57 y.o. MRN: [id number]  CC: Foot Pain   HPI Richard [name] is a 57 y.o. year old male with a history of peptic ulcer disease, alcoholic liver cirrhosis, GERD, colon cancer (status post partial colectomy) SVT, dilated cardiomyopathy (EF 60-65 %), Depression, left eye blindness.   Interval History:  Had a hospitalization from 09/28/2022 through 12/75/1700 for alcoholic hepatitis for which she was seen by GI and treated with prednisone.  He had a prior hospitalization few days prior to that but then left AMA.  He was also maintained on CIWA protocol with no evidence of withdrawal.  He did have multiple electrolyte derangements which were corrected. Discharge labs revealed improvement in LFTs but bilirubin remained elevated at 10.6.  He also had anemia with a Hgb of 7.3.  He is yet to be seen by GI and informs me he has no appointment.  Review of his chart indicates he was previously followed by Dr. Henrene Pastor.  He endorses feeling weak but has no abdominal pain, pedal edema or abdominal swelling. He continues to have pain in his feet and has to wear bedroom shoes and this is associated with burning.  He does have gabapentin at home for his pain. His hands 'lock up' worse in the morning. He sees Recruitment consultant and is on Percocet.which he states that at his last visit he just received a 10-day supply but he has an upcoming appointment in 3 days. Past Medical History:  Diagnosis Date   Abnormal nuclear cardiac imaging test    Acid reflux    Acute pancreatitis 08/14/2018   Alcohol withdrawal delirium ([place]) 55/49/4496   Alcoholic cardiomyopathy ([place]) 55/91/6384   Alcoholic hepatitis    Alcoholic ketoacidosis 55/59/9357   Ascites 12/13/2019   Aspiration pneumonia ([place]) 55/18/2017   Chest pain of uncertain etiology    Chronic systolic CHF (congestive heart failure) ([place])    Cirrhosis ([place])    Colon cancer  ([place])    DCM (dilated cardiomyopathy) ([place])    Drug abuse ([place]) 01/07/2020   Elevated troponin 06/27/2018   ETOH abuse    Gastropathy 08/14/2018   Heme positive stool 11/13/2017   Hepatic steatosis 08/21/2016   High anion gap metabolic acidosis 01/77/9390   History of colon cancer    HTN (hypertension)    Hypertensive urgency 06/27/2018   Hypoglycemia 06/27/2018   Hypokalemia 01/07/2020   Hypomagnesemia    Hypophosphatemia    Lactic acidosis 08/20/2016   Leukocytosis 01/07/2020   Neuropathy    Pancreatitis 08/2018   Polyp of ascending colon    Prolonged Q-T interval on ECG    Prolonged QT interval    Protein-calorie malnutrition, severe 05/02/2018   PUD (peptic ulcer disease)    SBP (spontaneous bacterial peritonitis) ([place]) 01/07/2020   Sepsis ([place]) 08/20/2016   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   SVT (supraventricular tachycardia)    Thrombocytopenia ([place]) 08/21/2016    Past Surgical History:  Procedure Laterality Date   BIOPSY  12/14/2019   Procedure: BIOPSY;  Surgeon: Mauri Pole, MD;  Location: [facility];  Service: Endoscopy;;   catherization  2007   COLONOSCOPY WITH PROPOFOL N/A 12/14/2019   Procedure: COLONOSCOPY WITH PROPOFOL;  Surgeon: Mauri Pole, MD;  Location: [facility];  Service: Endoscopy;  Laterality: N/A;   ESOPHAGOGASTRODUODENOSCOPY (EGD) WITH PROPOFOL N/A 12/14/2019   Procedure: ESOPHAGOGASTRODUODENOSCOPY (EGD) WITH PROPOFOL;  Surgeon: Mauri Pole, MD;  Location: [facility];  Service: Endoscopy;  Laterality: N/A;   HERNIA REPAIR  1969   1 x at birth and at 57 years old   [place]  2007   OPEN REDUCTION INTERNAL FIXATION (ORIF) HAND Right 2012   3rd  digit   POLYPECTOMY  12/14/2019   Procedure: POLYPECTOMY;  Surgeon: Mauri Pole, MD;  Location: [facility];  Service: Endoscopy;;    Family History  Problem Relation Age of Onset   Hypertension Mother    Colon cancer Father    Cancer Sister         type unknown   Kidney disease Sister        dialysis   Colon cancer Cousin        x 2   CAD Neg Hx    Stroke Neg Hx    Diabetes Neg Hx    Stomach cancer Neg Hx    Esophageal cancer Neg Hx    Pancreatic cancer Neg Hx    Colon polyps Neg Hx     Social History   Socioeconomic History   Marital status: Single    Spouse name: Not on file   Number of children: Not on file   Years of education: Not on file   Highest education level: Not on file  Occupational History   Not on file  Tobacco Use   Smoking status: Every Day    Packs/day: 1.00    Types: Cigarettes   Smokeless tobacco: Never  Vaping Use   Vaping Use: Never used  Substance and Sexual Activity   Alcohol use: Not Currently    Alcohol/week: 2.0 standard drinks of alcohol    Types: 1 Cans of beer, 1 Shots of liquor per week    Comment: last drink in February   Drug use: Yes    Types: Marijuana   Sexual activity: Not Currently  Other Topics Concern   Not on file  Social History Narrative   Not on file   Social Determinants of Health   Financial Resource Strain: High Risk (06/03/2020)   Overall Financial Resource Strain (CARDIA)    Difficulty of Paying Living Expenses: Very hard  Food Insecurity: Food Insecurity Present (09/28/2022)   Hunger Vital Sign    Worried About Running Out of Food in the Last Year: Sometimes true    Ran Out of Food in the Last Year: Sometimes true  Transportation Needs: No Transportation Needs (09/28/2022)   PRAPARE - Hydrologist (Medical): No    Lack of Transportation (Non-Medical): No  Physical Activity: Not on file  Stress: Not on file  Social Connections: Not on file    Allergies  Allergen Reactions   Aspirin Other (See Comments)    Acid reflux    Aspirin Other (See Comments)    Caused acid reflux   Penicillins Hives    Has patient had a PCN reaction causing immediate rash, facial/tongue/throat swelling, SOB or lightheadedness with  hypotension: yes Has patient had a PCN reaction causing severe rash involving mucus membranes or skin necrosis: no Has patient had a PCN reaction that required hospitalization: no Has patient had a PCN reaction occurring within the last 10 years: no If all of the above answers are "NO", then may proceed with Cephalosporin use.    Penicillins Hives    Outpatient Medications Prior to Visit  Medication Sig Dispense Refill   aspirin 81 MG EC tablet Take 1 tablet (81 mg total) by mouth daily. [place]  tablet 3   Cholecalciferol (VITAMIN D-3) 125 MCG (5000 UT) TABS Take 2 PO qd x 3 months, then 1 PO qd long-term after that 180 tablet 3   DULoxetine (CYMBALTA) 60 MG capsule Take 1 capsule (60 mg total) by mouth daily. 30 capsule 3   ferrous gluconate (FERGON) 324 MG tablet Take 1 tablet (324 mg total) by mouth daily with breakfast. 30 tablet 2   folic acid (FOLVITE) 1 MG tablet Take 1 tablet (1 mg total) by mouth daily. 30 tablet 0   furosemide (LASIX) 20 MG tablet TAKE 1 TABLET(20 MG) BY MOUTH DAILY 90 tablet 0   lactulose (CHRONULAC) 10 GM/15ML solution TAKE 30 ML BY MOUTH TWICE DAILY 946 mL 1   magnesium chloride (SLOW-MAG) 64 MG TBEC SR tablet Take 2 tablets (128 mg total) by mouth daily. 60 tablet 0   naloxone (NARCAN) nasal spray 4 mg/0.1 mL Place 1 spray into the nose as needed (accidental overdose).     oxyCODONE-acetaminophen (PERCOCET) 10-325 MG tablet Take 1 tablet by mouth 4 (four) times daily as needed for pain.     pantoprazole (PROTONIX) 40 MG tablet TAKE 1 TABLET(40 MG) BY MOUTH DAILY 90 tablet 0   prednisoLONE 5 MG TABS tablet Take 8 tablets (40 mg total) by mouth daily for 25 days. 200 tablet 0   thiamine 100 MG tablet Take 1 tablet (100 mg total) by mouth daily. 30 tablet 1   potassium chloride SA (KLOR-CON M) 20 MEQ tablet Take 1 tablet (20 mEq total) by mouth 2 (two) times daily for 5 days. 10 tablet 0   spironolactone (ALDACTONE) 100 MG tablet Take 1 tablet (100 mg total) by mouth  daily. 30 tablet 2   No facility-administered medications prior to visit.     ROS Review of Systems  Constitutional:  Positive for fatigue. Negative for activity change and appetite change.  HENT:  Negative for sinus pressure and sore throat.   Eyes:  Positive for visual disturbance.  Respiratory:  Negative for chest tightness, shortness of breath and wheezing.   Cardiovascular:  Negative for chest pain and palpitations.  Gastrointestinal:  Negative for abdominal distention, abdominal pain and constipation.  Genitourinary: Negative.   Neurological:  Positive for numbness.  Psychiatric/Behavioral:  Negative for behavioral problems and dysphoric mood.     Objective:  BP 120/74   Pulse 87   Ht 6' (1.829 m)   Wt 114 lb 12.8 oz (52.1 kg)   SpO2 99%   BMI 15.57 kg/m      10/26/2022    3:20 PM 10/08/2022   12:00 AM 10/07/2022   11:45 PM  BP/Weight  Systolic BP 027 741 287  Diastolic BP 74 69 73  Wt. (Lbs) 114.8    BMI 15.57 kg/m2        Physical Exam Constitutional:      Appearance: He is well-developed.  Eyes:     General: Scleral icterus present.  Cardiovascular:     Rate and Rhythm: Normal rate.     Heart sounds: Normal heart sounds. No murmur heard. Pulmonary:     Effort: Pulmonary effort is normal.     Breath sounds: Normal breath sounds. No wheezing or rales.  Chest:     Chest wall: No tenderness.  Abdominal:     General: Bowel sounds are normal. There is no distension.     Palpations: Abdomen is soft. There is no mass.     Tenderness: There is no abdominal tenderness.  Musculoskeletal:  Right lower leg: No edema.     Left lower leg: No edema.     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 He is able to make a fist bilaterally  Neurological:     Mental Status: He is alert and oriented to person, place, and time.  Psychiatric:        Mood and Affect: Mood normal.        Latest Ref Rng & Units 10/07/2022   11:11 PM 10/02/2022    7:53 AM  10/01/2022    8:10 AM  CMP  Glucose 70 - 99 mg/dL 106  88  107   BUN 6 - 20 mg/dL '7  7  6   '$ Creatinine 0.61 - 1.24 mg/dL 0.71  0.59  0.50   Sodium 135 - 145 mmol/L 128  130  131   Potassium 3.5 - 5.1 mmol/L 2.9  3.2  3.4   Chloride 98 - 111 mmol/L 99  100  101   CO2 22 - 32 mmol/L '20  21  20   '$ Calcium 8.9 - 10.3 mg/dL 8.1  8.8  8.9   Total Protein 6.5 - 8.1 g/dL 5.9  7.0  7.6   Total Bilirubin 0.3 - 1.2 mg/dL 10.5  12.9  14.3   Alkaline Phos 38 - 126 U/L 107  109  104   AST 15 - 41 U/L 63  81  92   ALT 0 - 44 U/L 38  53  55     Lipid Panel     Component Value Date/Time   CHOL 156 06/24/2022 0900   TRIG 58 06/24/2022 0900   HDL 77 06/24/2022 0900   CHOLHDL 2.0 06/24/2022 0900   CHOLHDL NOT CALCULATED 01/11/2020 0640   VLDL 30 01/11/2020 0640   LDLCALC 67 06/24/2022 0900    CBC    Component Value Date/Time   WBC 9.8 10/07/2022 2311   RBC 2.50 (L) 10/07/2022 2311   HGB 7.3 (L) 10/07/2022 2311   HGB 10.7 (L) 08/05/2021 1509   HGB 15.6 08/31/2007 0955   HCT 21.9 (L) 10/07/2022 2311   HCT 33.2 (L) 08/05/2021 1509   HCT 44.5 08/31/2007 0955   PLT 183 10/07/2022 2311   PLT 144 (L) 08/05/2021 1509   MCV 87.6 10/07/2022 2311   MCV 80 08/05/2021 1509   MCV 97.2 08/31/2007 0955   MCH 29.2 10/07/2022 2311   MCHC 33.3 10/07/2022 2311   RDW 27.1 (H) 10/07/2022 2311   RDW 20.1 (H) 08/05/2021 1509   RDW 13.0 08/31/2007 0955   LYMPHSABS 0.9 10/07/2022 2311   LYMPHSABS 2.0 08/05/2021 1509   LYMPHSABS 1.6 08/31/2007 0955   MONOABS 1.1 (H) 10/07/2022 2311   MONOABS 0.6 08/31/2007 0955   EOSABS 0.2 10/07/2022 2311   EOSABS 0.2 08/05/2021 1509   BASOSABS 0.1 10/07/2022 2311   BASOSABS 0.1 08/05/2021 1509   BASOSABS 0.0 08/31/2007 0955    Lab Results  Component Value Date   HGBA1C <4.2 (L) 09/24/2022    Assessment & Plan:  1. Alcoholic cirrhosis of liver without ascites ([place]) He states he quit alcohol 2 weeks ago - Ambulatory referral to Gastroenterology -  CMP14+EGFR  2. Alcoholic hepatitis, unspecified whether ascites present He is icteric Bilirubin elevated at 10.6, AST still elevated at discharge will repeat - Ambulatory referral to Gastroenterology  3. Anemia of chronic disease Discharged with hemoglobin of 7.3 This could explain his fatigue Will send of CBC and placed on ferrous sulfate if indicated  -  CBC with Differential/Platelet  4. Primary osteoarthritis of both hands He is currently under the care of pain management and will be seeing them in 2 days  5. Alcoholic peripheral neuropathy (HCC) Currently on Cymbalta and gabapentin Symptoms are uncontrolled Strongly advised to abstain from alcohol - Magnesium   No orders of the defined types were placed in this encounter.   Follow-up: Return in about 3 months (around 01/25/2023).       Charlott Rakes, MD, FAAFP. [facility] [place], [place]   10/26/2022, 6:02 PM

## 2022-10-27 ENCOUNTER — Other Ambulatory Visit: Payer: Self-pay | Admitting: Family Medicine

## 2022-10-27 ENCOUNTER — Other Ambulatory Visit: Payer: Self-pay

## 2022-10-27 DIAGNOSIS — K7682 Hepatic encephalopathy: Secondary | ICD-10-CM

## 2022-10-27 LAB — CBC WITH DIFFERENTIAL/PLATELET
Basophils Absolute: 0.1 10*3/uL (ref 0.0–0.2)
Basos: 1 %
EOS (ABSOLUTE): 0.1 10*3/uL (ref 0.0–0.4)
Eos: 1 %
Hematocrit: 23.7 % — ABNORMAL LOW (ref 37.5–51.0)
Hemoglobin: 8.5 g/dL — ABNORMAL LOW (ref 13.0–17.7)
Immature Grans (Abs): 0 10*3/uL (ref 0.0–0.1)
Immature Granulocytes: 1 %
Lymphocytes Absolute: 1.2 10*3/uL (ref 0.7–3.1)
Lymphs: 19 %
MCH: 27.8 pg (ref 26.6–33.0)
MCHC: 35.9 g/dL — ABNORMAL HIGH (ref 31.5–35.7)
MCV: 78 fL — ABNORMAL LOW (ref 79–97)
Monocytes Absolute: 0.7 10*3/uL (ref 0.1–0.9)
Monocytes: 11 %
Neutrophils Absolute: 4.2 10*3/uL (ref 1.4–7.0)
Neutrophils: 67 %
Platelets: 163 10*3/uL (ref 150–450)
RBC: 3.06 x10E6/uL — ABNORMAL LOW (ref 4.14–5.80)
RDW: 17.1 % — ABNORMAL HIGH (ref 11.6–15.4)
WBC: 6.3 10*3/uL (ref 3.4–10.8)

## 2022-10-27 LAB — CMP14+EGFR
ALT: 11 IU/L (ref 0–44)
AST: 29 IU/L (ref 0–40)
Albumin/Globulin Ratio: 0.8 — ABNORMAL LOW (ref 1.2–2.2)
Albumin: 2.8 g/dL — ABNORMAL LOW (ref 3.8–4.9)
Alkaline Phosphatase: 107 IU/L (ref 44–121)
BUN/Creatinine Ratio: 5 — ABNORMAL LOW (ref 9–20)
BUN: 4 mg/dL — ABNORMAL LOW (ref 6–24)
Bilirubin Total: 5.3 mg/dL — ABNORMAL HIGH (ref 0.0–1.2)
CO2: 22 mmol/L (ref 20–29)
Calcium: 8.2 mg/dL — ABNORMAL LOW (ref 8.7–10.2)
Chloride: 96 mmol/L (ref 96–106)
Creatinine, Ser: 0.8 mg/dL (ref 0.76–1.27)
Globulin, Total: 3.4 g/dL (ref 1.5–4.5)
Glucose: 93 mg/dL (ref 70–99)
Potassium: 3.1 mmol/L — ABNORMAL LOW (ref 3.5–5.2)
Sodium: 133 mmol/L — ABNORMAL LOW (ref 134–144)
Total Protein: 6.2 g/dL (ref 6.0–8.5)
eGFR: 105 mL/min/{1.73_m2} (ref >59)

## 2022-10-27 LAB — MAGNESIUM: Magnesium: 1.3 mg/dL — ABNORMAL LOW (ref 1.6–2.3)

## 2022-10-27 MED ORDER — FERROUS GLUCONATE 324 (38 FE) MG PO TABS
324.0000 mg | ORAL_TABLET | Freq: Two times a day (BID) | ORAL | 3 refills | Status: DC
  Filled 2022-10-27: qty 60, 30d supply, fill #0

## 2022-10-27 MED ORDER — POTASSIUM CHLORIDE CRYS ER 20 MEQ PO TBCR
20.0000 meq | EXTENDED_RELEASE_TABLET | Freq: Every day | ORAL | 3 refills | Status: DC
  Filled 2022-10-27 – 2022-11-04 (×2): qty 30, 30d supply, fill #0

## 2022-10-27 NOTE — Telephone Encounter (Signed)
Requested medication (s) are due for refill today: yes  Requested medication (s) are on the active medication list: yes  Last refill:  08/30/22 #913m/1  Future visit scheduled: yes  Notes to clinic:  Unable to refill per protocol due to failed labs, no updated results. Had OV and updated labs yesterday but labs were lose. Please review for refill      Requested Prescriptions  Pending Prescriptions Disp Refills   lactulose (CL[place]ll[place] 10 GM/15ML solution [Pharmacy Med Name: LACTULOSE 10GM/15ML SOLUTION] 946 mL 1    Sig: TAKE 30 ML BY MOUTH TWICE DAILY     Gastroenterology:  Laxatives - lactulose Failed - 10/27/2022  3:59 AM      Failed - CO2 in normal range and within 360 days    CO2  Date Value Ref Range Status  10/26/2022 22 20 - 29 mmol/L Final   Bicarbonate  Date Value Ref Range Status  08/14/2018 17.5 (L) 20.0 - 28.0 mmol/L Final         Failed - K in normal range and within 360 days    Potassium  Date Value Ref Range Status  10/26/2022 3.1 (L) 3.5 - 5.2 mmol/L Final         Failed - Na in normal range and within 360 days    Sodium  Date Value Ref Range Status  10/26/2022 133 (L) 134 - 144 mmol/L Final         Passed - Cl in normal range and within 360 days    Chloride  Date Value Ref Range Status  10/26/2022 96 96 - 106 mmol/L Final         Passed - Valid encounter within last 12 months    Recent Outpatient Visits           Yesterday Alcoholic cirrhosis of liver without ascites ([place]   [place]   8 months ago Other chronic pain   [place]   8 months ago Alcoholic cirrhosis of liver without ascites ([facility]   [place]   11 months ago Thoracolumbar back pain   [place]   1 year ago Hyponatremia   [place]      Future Appointments             In 2 months [place]ott Rakes [place]

## 2022-10-28 ENCOUNTER — Other Ambulatory Visit: Payer: Self-pay | Admitting: Family Medicine

## 2022-10-28 DIAGNOSIS — Z79899 Other long term (current) drug therapy: Secondary | ICD-10-CM | POA: Diagnosis not present

## 2022-10-28 DIAGNOSIS — K7682 Hepatic encephalopathy: Secondary | ICD-10-CM

## 2022-11-01 DIAGNOSIS — Z79899 Other long term (current) drug therapy: Secondary | ICD-10-CM | POA: Diagnosis not present

## 2022-11-03 ENCOUNTER — Other Ambulatory Visit: Payer: Self-pay

## 2022-11-04 ENCOUNTER — Other Ambulatory Visit: Payer: Self-pay

## 2022-11-04 ENCOUNTER — Ambulatory Visit: Payer: Self-pay | Admitting: *Deleted

## 2022-11-04 NOTE — Telephone Encounter (Signed)
Third attempt to contact patient- no answer- "call can not be completed at this time" message

## 2022-11-04 NOTE — Telephone Encounter (Signed)
Second attempt to contact patient- no answer and call can not be completed at this time message

## 2022-11-04 NOTE — Telephone Encounter (Signed)
Attempted to contact patient no answer °

## 2022-11-04 NOTE — Telephone Encounter (Signed)
Summary: mucus / stuffy nose   Pt stated he's been coughing all night and has along of mucus build up that he can barely breath through his nose / pt asked for a referral but not sure to where/ please advise     Attempted to call patient- no answer and call can not be completed at this time message

## 2022-11-07 ENCOUNTER — Other Ambulatory Visit: Payer: Self-pay

## 2022-11-11 ENCOUNTER — Other Ambulatory Visit: Payer: Self-pay | Admitting: Family Medicine

## 2022-11-11 DIAGNOSIS — K7011 Alcoholic hepatitis with ascites: Secondary | ICD-10-CM

## 2022-11-11 NOTE — Telephone Encounter (Signed)
Provide Requested Prescriptions  Pending Prescriptions Disp Refills   pantoprazole (PROTONIX) 40 MG tablet [Pharmacy Med Name: PANTOPRAZOLE 40MG TABLETS] 90 tablet 0    Sig: TAKE 1 TABLET(40 MG) BY MOUTH DAILY     Gastroenterology: Proton Pump Inhibitors Passed - 11/11/2022  3:58 AM      Passed - Valid encounter within last 12 months    Recent Outpatient Visits           2 weeks ago Alcoholic cirrhosis of liver without ascites ([place])   [place], [place], MD   8 months ago Other chronic pain   [place] Karle Plumber B, MD   9 months ago Alcoholic cirrhosis of liver without ascites ([place])   [place], Charlane Ferretti, MD   1 year ago Thoracolumbar back pain   [place], Charlane Ferretti, MD   1 year ago Hyponatremia   [place], [place], [place]       Future Appointments             In 2 months Charlott Rakes, MD [place]             furosemide (LASIX) 20 MG tablet [Pharmacy Med Name: FUROSEMIDE 20MG TABLETS] 90 tablet 0    Sig: TAKE 1 TABLET(20 MG) BY MOUTH DAILY     Cardiovascular:  Diuretics - Loop Failed - 11/11/2022  3:58 AM      Failed - K in normal range and within 180 days    Potassium  Date Value Ref Range Status  10/26/2022 3.1 (L) 3.5 - 5.2 mmol/L Final         Failed - Ca in normal range and within 180 days    Calcium  Date Value Ref Range Status  10/26/2022 8.2 (L) 8.7 - 10.2 mg/dL Final   Calcium, Ion  Date Value Ref Range Status  09/23/2022 0.81 (LL) 1.15 - 1.40 mmol/L Final         Failed - Na in normal range and within 180 days    Sodium  Date Value Ref Range Status  10/26/2022 133 (L) 134 - 144 mmol/L Final         Failed - Mg Level in normal range and within 180 days    Magnesium  Date Value Ref Range Status   10/26/2022 1.3 (L) 1.6 - 2.3 mg/dL Final         Passed - Cr in normal range and within 180 days    Creat  Date Value Ref Range Status  11/06/2020 0.60 (L) 0.70 - 1.33 mg/dL Final    Comment:    For patients >85 years of age, the reference limit for Creatinine is approximately 13% higher for people identified as African-American. .    Creatinine, Ser  Date Value Ref Range Status  10/26/2022 0.80 0.76 - 1.27 mg/dL Final         Passed - Cl in normal range and within 180 days    Chloride  Date Value Ref Range Status  10/26/2022 96 96 - 106 mmol/L Final         Passed - Last BP in normal range    BP Readings from Last 1 Encounters:  10/26/22 120/74         Passed - Valid encounter within last 6 months  Recent Outpatient Visits           2 weeks ago Alcoholic cirrhosis of liver without ascites [place])   [place] Charlott Rakes, MD   8 months ago Other chronic pain   [place] Karle Plumber B, MD   9 months ago Alcoholic cirrhosis of liver without ascites [facility])   [place] Charlott Rakes, MD   1 year ago Thoracolumbar back pain   [place], Enobong, MD   1 year ago Hyponatremia   [place], Dionne Bucy, [place]       Future Appointments             In 2 months Charlott Rakes, MD [place]             r had reviewed lab results and no changes in these medications addressed

## 2022-11-16 ENCOUNTER — Inpatient Hospital Stay (HOSPITAL_COMMUNITY)
Admission: EM | Admit: 2022-11-16 | Discharge: 2022-11-21 | DRG: 432 | Disposition: A | Discharge status: Home / Self Care | Payer: Medicaid Other | Attending: Internal Medicine | Admitting: Internal Medicine

## 2022-11-16 DIAGNOSIS — Z800 Family history of malignant neoplasm of digestive organs: Secondary | ICD-10-CM

## 2022-11-16 DIAGNOSIS — Z880 Allergy status to penicillin: Secondary | ICD-10-CM

## 2022-11-16 DIAGNOSIS — Z681 Body mass index (BMI) 19 or less, adult: Secondary | ICD-10-CM

## 2022-11-16 DIAGNOSIS — D684 Acquired coagulation factor deficiency: Secondary | ICD-10-CM | POA: Diagnosis present

## 2022-11-16 DIAGNOSIS — I8511 Secondary esophageal varices with bleeding: Secondary | ICD-10-CM

## 2022-11-16 DIAGNOSIS — I110 Hypertensive heart disease with heart failure: Secondary | ICD-10-CM | POA: Diagnosis present

## 2022-11-16 DIAGNOSIS — E43 Unspecified severe protein-calorie malnutrition: Secondary | ICD-10-CM | POA: Diagnosis present

## 2022-11-16 DIAGNOSIS — K920 Hematemesis: Secondary | ICD-10-CM

## 2022-11-16 DIAGNOSIS — E876 Hypokalemia: Secondary | ICD-10-CM | POA: Diagnosis present

## 2022-11-16 DIAGNOSIS — Z886 Allergy status to analgesic agent status: Secondary | ICD-10-CM

## 2022-11-16 DIAGNOSIS — F17210 Nicotine dependence, cigarettes, uncomplicated: Secondary | ICD-10-CM | POA: Diagnosis present

## 2022-11-16 DIAGNOSIS — K7031 Alcoholic cirrhosis of liver with ascites: Secondary | ICD-10-CM | POA: Diagnosis not present

## 2022-11-16 DIAGNOSIS — Z8249 Family history of ischemic heart disease and other diseases of the circulatory system: Secondary | ICD-10-CM

## 2022-11-16 DIAGNOSIS — Z85038 Personal history of other malignant neoplasm of large intestine: Secondary | ICD-10-CM

## 2022-11-16 DIAGNOSIS — R109 Unspecified abdominal pain: Secondary | ICD-10-CM | POA: Diagnosis present

## 2022-11-16 DIAGNOSIS — Z7982 Long term (current) use of aspirin: Secondary | ICD-10-CM

## 2022-11-16 DIAGNOSIS — D638 Anemia in other chronic diseases classified elsewhere: Secondary | ICD-10-CM | POA: Diagnosis present

## 2022-11-16 DIAGNOSIS — K7011 Alcoholic hepatitis with ascites: Secondary | ICD-10-CM

## 2022-11-16 DIAGNOSIS — K760 Fatty (change of) liver, not elsewhere classified: Secondary | ICD-10-CM | POA: Diagnosis present

## 2022-11-16 DIAGNOSIS — E871 Hypo-osmolality and hyponatremia: Secondary | ICD-10-CM | POA: Diagnosis present

## 2022-11-16 DIAGNOSIS — R64 Cachexia: Secondary | ICD-10-CM | POA: Diagnosis present

## 2022-11-16 DIAGNOSIS — Z9049 Acquired absence of other specified parts of digestive tract: Secondary | ICD-10-CM

## 2022-11-16 DIAGNOSIS — M6284 Sarcopenia: Secondary | ICD-10-CM | POA: Diagnosis present

## 2022-11-16 DIAGNOSIS — K7040 Alcoholic hepatic failure without coma: Secondary | ICD-10-CM | POA: Diagnosis present

## 2022-11-16 DIAGNOSIS — I5022 Chronic systolic (congestive) heart failure: Secondary | ICD-10-CM | POA: Diagnosis present

## 2022-11-16 DIAGNOSIS — I426 Alcoholic cardiomyopathy: Secondary | ICD-10-CM | POA: Diagnosis present

## 2022-11-16 DIAGNOSIS — K219 Gastro-esophageal reflux disease without esophagitis: Secondary | ICD-10-CM | POA: Diagnosis present

## 2022-11-16 DIAGNOSIS — Z8711 Personal history of peptic ulcer disease: Secondary | ICD-10-CM

## 2022-11-16 DIAGNOSIS — I252 Old myocardial infarction: Secondary | ICD-10-CM

## 2022-11-16 DIAGNOSIS — R0981 Nasal congestion: Secondary | ICD-10-CM | POA: Diagnosis present

## 2022-11-16 DIAGNOSIS — Z79899 Other long term (current) drug therapy: Secondary | ICD-10-CM

## 2022-11-16 DIAGNOSIS — I10 Essential (primary) hypertension: Secondary | ICD-10-CM | POA: Diagnosis not present

## 2022-11-16 DIAGNOSIS — N5089 Other specified disorders of the male genital organs: Secondary | ICD-10-CM | POA: Diagnosis present

## 2022-11-16 DIAGNOSIS — D6959 Other secondary thrombocytopenia: Secondary | ICD-10-CM | POA: Diagnosis present

## 2022-11-16 DIAGNOSIS — K7200 Acute and subacute hepatic failure without coma: Secondary | ICD-10-CM

## 2022-11-16 DIAGNOSIS — Z841 Family history of disorders of kidney and ureter: Secondary | ICD-10-CM

## 2022-11-16 DIAGNOSIS — Z8719 Personal history of other diseases of the digestive system: Secondary | ICD-10-CM

## 2022-11-16 DIAGNOSIS — E8809 Other disorders of plasma-protein metabolism, not elsewhere classified: Secondary | ICD-10-CM | POA: Diagnosis present

## 2022-11-16 DIAGNOSIS — K766 Portal hypertension: Secondary | ICD-10-CM | POA: Diagnosis present

## 2022-11-16 LAB — CBC WITH DIFFERENTIAL/PLATELET
Abs Immature Granulocytes: 0.04 10*3/uL (ref 0.00–0.07)
Basophils Absolute: 0.1 10*3/uL (ref 0.0–0.1)
Basophils Relative: 1 %
Eosinophils Absolute: 0.1 10*3/uL (ref 0.0–0.5)
Eosinophils Relative: 2 %
HCT: 25.4 % — ABNORMAL LOW (ref 39.0–52.0)
Hemoglobin: 8.6 g/dL — ABNORMAL LOW (ref 13.0–17.0)
Immature Granulocytes: 1 %
Lymphocytes Relative: 19 %
Lymphs Abs: 1.3 10*3/uL (ref 0.7–4.0)
MCH: 26.5 pg (ref 26.0–34.0)
MCHC: 33.9 g/dL (ref 30.0–36.0)
MCV: 78.4 fL — ABNORMAL LOW (ref 80.0–100.0)
Monocytes Absolute: 0.9 10*3/uL (ref 0.1–1.0)
Monocytes Relative: 12 %
Neutro Abs: 4.6 10*3/uL (ref 1.7–7.7)
Neutrophils Relative %: 65 %
Platelets: 197 10*3/uL (ref 150–400)
RBC: 3.24 MIL/uL — ABNORMAL LOW (ref 4.22–5.81)
RDW: 19.6 % — ABNORMAL HIGH (ref 11.5–15.5)
WBC: 7 10*3/uL (ref 4.0–10.5)
nRBC: 0 % (ref 0.0–0.2)

## 2022-11-16 LAB — COMPREHENSIVE METABOLIC PANEL
ALT: 15 U/L (ref 0–44)
AST: 49 U/L — ABNORMAL HIGH (ref 15–41)
Albumin: 2.1 g/dL — ABNORMAL LOW (ref 3.5–5.0)
Alkaline Phosphatase: 144 U/L — ABNORMAL HIGH (ref 38–126)
Anion gap: 12 (ref 5–15)
BUN: <5 mg/dL — ABNORMAL LOW (ref 6–20)
CO2: 23 mmol/L (ref 22–32)
Calcium: 8.4 mg/dL — ABNORMAL LOW (ref 8.9–10.3)
Chloride: 91 mmol/L — ABNORMAL LOW (ref 98–111)
Creatinine, Ser: 0.75 mg/dL (ref 0.61–1.24)
GFR, Estimated: >60 mL/min (ref >60)
Glucose, Bld: 105 mg/dL — ABNORMAL HIGH (ref 70–99)
Potassium: 2.6 mmol/L — CL (ref 3.5–5.1)
Sodium: 126 mmol/L — ABNORMAL LOW (ref 135–145)
Total Bilirubin: 4.8 mg/dL — ABNORMAL HIGH (ref 0.3–1.2)
Total Protein: 6.7 g/dL (ref 6.5–8.1)

## 2022-11-16 LAB — BODY FLUID CELL COUNT WITH DIFFERENTIAL
Eos, Fluid: 0 %
Lymphs, Fluid: 34 %
Monocyte-Macrophage-Serous Fluid: 58 % (ref 50–90)
Neutrophil Count, Fluid: 7 % (ref 0–25)
Total Nucleated Cell Count, Fluid: 138 cu mm (ref 0–1000)

## 2022-11-16 LAB — LACTATE DEHYDROGENASE, PLEURAL OR PERITONEAL FLUID: LD, Fluid: <25 U/L — ABNORMAL HIGH (ref 3–23)

## 2022-11-16 LAB — PROTEIN, PLEURAL OR PERITONEAL FLUID: Total protein, fluid: <3 g/dL

## 2022-11-16 LAB — ALBUMIN, PLEURAL OR PERITONEAL FLUID: Albumin, Fluid: <1.5 g/dL

## 2022-11-16 LAB — PROTIME-INR
INR: 2 — ABNORMAL HIGH (ref 0.8–1.2)
Prothrombin Time: 22.7 seconds — ABNORMAL HIGH (ref 11.4–15.2)

## 2022-11-16 LAB — AMMONIA: Ammonia: 53 umol/L — ABNORMAL HIGH (ref 9–35)

## 2022-11-16 LAB — LIPASE, BLOOD: Lipase: 30 U/L (ref 11–51)

## 2022-11-16 LAB — BRAIN NATRIURETIC PEPTIDE: B Natriuretic Peptide: 37 pg/mL (ref 0.0–100.0)

## 2022-11-16 LAB — GLUCOSE, PLEURAL OR PERITONEAL FLUID: Glucose, Fluid: 123 mg/dL

## 2022-11-16 LAB — APTT: aPTT: 45 seconds — ABNORMAL HIGH (ref 24–36)

## 2022-11-16 MED ORDER — POTASSIUM CHLORIDE 10 MEQ/100ML IV SOLN
10.0000 meq | INTRAVENOUS | Status: AC
  Administered 2022-11-16 – 2022-11-17 (×4): 10 meq via INTRAVENOUS
  Filled 2022-11-16 (×4): qty 100

## 2022-11-16 MED ORDER — LIDOCAINE-EPINEPHRINE (PF) 2 %-1:200000 IJ SOLN
10.0000 mL | Freq: Once | INTRAMUSCULAR | Status: AC
  Administered 2022-11-16: 10 mL via INTRADERMAL
  Filled 2022-11-16: qty 20

## 2022-11-16 MED ORDER — SODIUM CHLORIDE 0.9 % IV SOLN
2.0000 g | Freq: Once | INTRAVENOUS | Status: AC
  Administered 2022-11-16: 2 g via INTRAVENOUS
  Filled 2022-11-16: qty 20

## 2022-11-16 MED ORDER — POTASSIUM CHLORIDE CRYS ER 20 MEQ PO TBCR
40.0000 meq | EXTENDED_RELEASE_TABLET | Freq: Once | ORAL | Status: AC
  Administered 2022-11-16: 40 meq via ORAL
  Filled 2022-11-16: qty 2

## 2022-11-16 MED ORDER — MAGNESIUM OXIDE -MG SUPPLEMENT 400 (240 MG) MG PO TABS
800.0000 mg | ORAL_TABLET | Freq: Once | ORAL | Status: AC
  Administered 2022-11-16: 800 mg via ORAL
  Filled 2022-11-16: qty 2

## 2022-11-16 NOTE — ED Provider Triage Note (Signed)
Emergency Medicine Provider Triage Evaluation Note  Richard [name] , a 57 y.o. male  was evaluated in triage.  Pt complains of abdominal pain and distention.  Symptoms been ongoing, states he had a paracentesis 4 years ago but has not required one since.  History of cirrhosis, denies currently drinking alcohol.  No hematemesis, confusion, dark and tarry stools, chest pain.  Review of Systems  Per HPI  Physical Exam  BP 105/72   Pulse 93   Temp 98.3 F (36.8 C)   Resp 16   SpO2 94%  Gen:   Awake, no distress   Resp:  Normal effort  MSK:   Moves extremities without difficulty  Other:  Abdominal distention, ascites. Not particularly tender. Rle edema, pulses palpable. Scleral icterics.   Medical Decision Making  Medically screening exam initiated at 4:22 PM.  Appropriate orders placed.  Tammy Santis Shadoan was informed that the remainder of the evaluation will be completed by another provider, this initial triage assessment does not replace that evaluation, and the importance of remaining in the ED until their evaluation is complete.     Sherrill Raring, PA-C 11/16/22 1624

## 2022-11-16 NOTE — ED Notes (Signed)
Pt is not answering when being called to room

## 2022-11-16 NOTE — ED Provider Notes (Signed)
[facility] Provider Note   CSN: [id number] Arrival date & time: 11/16/22  1609     History {Add pertinent medical, surgical, social history, OB history to HPI:1} Chief Complaint  Patient presents with   Abdominal Pain    Richard [name] is a 57 y.o. male.  57 yo M with a chief complaints of abdominal discomfort.  He tells me that he is struggled with alcoholic cirrhosis and has had some time since he is needed to have a paracentesis.  He tells me that his abdomen has been bothering him for about a week or so.  He also has been having painful swelling to the right foot.  He said that he had tried to put his shoe on at some point in his life and felt like that it injured it.  Feels like it is always more swollen than the other side.  Occurs off and on.   Abdominal Pain      Home Medications Prior to Admission medications   Medication Sig Start Date End Date Taking? Authorizing Provider  aspirin 81 MG EC tablet Take 1 tablet (81 mg total) by mouth daily. 06/16/20   Fulp, Cammie, MD  Cholecalciferol (VITAMIN D-3) 125 MCG (5000 UT) TABS Take 2 PO qd x 3 months, then 1 PO qd long-term after that 11/11/20   Hilts, Michael, MD  DULoxetine (CYMBALTA) 60 MG capsule Take 1 capsule (60 mg total) by mouth daily. 11/09/21 11/09/22  Charlott Rakes, MD  ferrous gluconate (FERGON) 324 MG tablet Take 1 tablet (324 mg total) by mouth 2 (two) times daily with a meal. 10/27/22   Charlott Rakes, MD  folic acid (FOLVITE) 1 MG tablet Take 1 tablet (1 mg total) by mouth daily. 10/02/22   Aline August, MD  furosemide (LASIX) 20 MG tablet TAKE 1 TABLET(20 MG) BY MOUTH DAILY 11/11/22   Charlott Rakes, MD  lactulose (CHRONULAC) 10 GM/15ML solution TAKE 30 ML BY MOUTH TWICE DAILY 10/28/22   Charlott Rakes, MD  magnesium chloride (SLOW-MAG) 64 MG TBEC SR tablet Take 2 tablets (128 mg total) by mouth daily. 10/08/22   Blue, Soijett A, PA-C  naloxone (NARCAN) nasal spray  4 mg/0.1 mL Place 1 spray into the nose as needed (accidental overdose). 07/06/22   [provider]  oxyCODONE-acetaminophen (PERCOCET) 10-325 MG tablet Take 1 tablet by mouth 4 (four) times daily as needed for pain. 08/26/22   [provider]  pantoprazole (PROTONIX) 40 MG tablet TAKE 1 TABLET(40 MG) BY MOUTH DAILY 11/11/22   Charlott Rakes, MD  potassium chloride SA (KLOR-CON M) 20 MEQ tablet Take 1 tablet (20 mEq total) by mouth daily. 10/27/22   Charlott Rakes, MD  spironolactone (ALDACTONE) 100 MG tablet Take 1 tablet (100 mg total) by mouth daily. 11/05/21 09/29/22  Kathie Dike, MD  thiamine 100 MG tablet Take 1 tablet (100 mg total) by mouth daily. 11/06/21   Kathie Dike, MD      Allergies    Aspirin, Aspirin, Penicillins, and Penicillins    Review of Systems   Review of Systems  Gastrointestinal:  Positive for abdominal pain.    Physical Exam Updated Vital Signs BP 109/81 (BP Location: Left Arm)   Pulse 83   Temp 98.1 F (36.7 C) (Oral)   Resp 19   SpO2 99%  Physical Exam Vitals and nursing note reviewed.  Constitutional:      Appearance: He is well-developed.     Comments: Chronically ill-appearing, cachectic  HENT:     Head: Normocephalic and atraumatic.  Eyes:     Pupils: Pupils are equal, round, and reactive to light.  Neck:     Vascular: No JVD.  Cardiovascular:     Rate and Rhythm: Normal rate and regular rhythm.     Heart sounds: No murmur heard.    No friction rub. No gallop.  Pulmonary:     Effort: No respiratory distress.     Breath sounds: No wheezing.  Abdominal:     General: There is no distension.     Tenderness: There is no abdominal tenderness. There is no guarding or rebound.  Musculoskeletal:        General: Normal range of motion.     Cervical back: Normal range of motion and neck supple.     Comments: Bilateral foot swelling right slightly greater than left.  Intact pulse motor and sensation.  Skin:    Coloration: Skin  is not pale.     Findings: No rash.  Neurological:     Mental Status: He is alert and oriented to person, place, and time.  Psychiatric:        Behavior: Behavior normal.     ED Results / Procedures / Treatments   Labs (all labs ordered are listed, but only abnormal results are displayed) Labs Reviewed  CBC WITH DIFFERENTIAL/PLATELET - Abnormal; Notable for the following components:      Result Value   RBC 3.24 (*)    Hemoglobin 8.6 (*)    HCT 25.4 (*)    MCV 78.4 (*)    RDW 19.6 (*)    All other components within normal limits  COMPREHENSIVE METABOLIC PANEL - Abnormal; Notable for the following components:   Sodium 126 (*)    Potassium 2.6 (*)    Chloride 91 (*)    Glucose, Bld 105 (*)    BUN <5 (*)    Calcium 8.4 (*)    Albumin 2.1 (*)    AST 49 (*)    Alkaline Phosphatase 144 (*)    Total Bilirubin 4.8 (*)    All other components within normal limits  PROTIME-INR - Abnormal; Notable for the following components:   Prothrombin Time 22.7 (*)    INR 2.0 (*)    All other components within normal limits  APTT - Abnormal; Notable for the following components:   aPTT 45 (*)    All other components within normal limits  AMMONIA - Abnormal; Notable for the following components:   Ammonia 53 (*)    All other components within normal limits  BODY FLUID CULTURE W GRAM STAIN  LIPASE, BLOOD  BRAIN NATRIURETIC PEPTIDE  URINALYSIS, ROUTINE W REFLEX MICROSCOPIC  MAGNESIUM  LACTATE DEHYDROGENASE, PLEURAL OR PERITONEAL FLUID  GLUCOSE, PLEURAL OR PERITONEAL FLUID  PROTEIN, PLEURAL OR PERITONEAL FLUID  ALBUMIN, PLEURAL OR PERITONEAL FLUID   BODY FLUID CELL COUNT WITH DIFFERENTIAL    EKG EKG Interpretation  Date/Time:  Wednesday November 16 2022 20:22:18 EST Ventricular Rate:  82 PR Interval:  162 QRS Duration: 118 QT Interval:  411 QTC Calculation: 480 R Axis:   68 Text Interpretation: Sinus rhythm Nonspecific intraventricular conduction delay Artifact in lead(s) I II  aVR aVL background noise Otherwise no significant change Confirmed by Deno Etienne [phone]) on 11/16/2022 8:34:23 PM  Radiology No results found.  Procedures .Paracentesis  Date/Time: 11/16/2022 9:41 PM  Performed by: Deno Etienne, DO Authorized by: Deno Etienne, DO   Consent:    Consent obtained:  Verbal   Consent given by:  Patient   Risks, benefits, and alternatives were discussed: yes     Risks discussed:  Bleeding, bowel perforation, infection and pain   Alternatives discussed:  No treatment, alternative treatment and delayed treatment Universal protocol:    Procedure explained and questions answered to patient or proxy's satisfaction: yes     Immediately prior to procedure, a time out was called: yes     Patient identity confirmed:  Verbally with patient Pre-procedure details:    Procedure purpose:  Diagnostic   Preparation: Patient was prepped and draped in usual sterile fashion   Anesthesia:    Anesthesia method:  Local infiltration   Local anesthetic:  Lidocaine 2% WITH epi Procedure details:    Needle gauge:  18   Ultrasound guidance: yes     Puncture site:  R lower quadrant   Fluid removed amount:  1.6L   Fluid appearance:  Cloudy and yellow   Dressing:  4x4 sterile gauze Post-procedure details:    Procedure completion:  Tolerated well, no immediate complications   {Document cardiac monitor, telemetry assessment procedure when appropriate:1}  Medications Ordered in ED Medications  potassium chloride 10 mEq in 100 mL IVPB (has no administration in time range)  potassium chloride SA (KLOR-CON M) CR tablet 40 mEq (has no administration in time range)  magnesium oxide (MAG-OX) tablet 800 mg (has no administration in time range)  lidocaine-EPINEPHrine (XYLOCAINE W/EPI) 2 %-1:200000 (PF) injection 10 mL (has no administration in time range)    ED Course/ Medical Decision Making/ A&P   {   Click here for ABCD2, HEART and other calculatorsREFRESH Note before signing :1}                           Medical Decision Making Amount and/or Complexity of Data Reviewed Labs: ordered. ECG/medicine tests: ordered.  Risk OTC drugs. Prescription drug management.   57 yo M with a significant past medical history of alcohol cirrhosis comes in with a chief complaints of abdominal discomforts and pain to the right foot.  Patient is chronically ill-appearing.  His potassium is 2.6.  No obvious EKG changes but has some significant background noise.  Ammonia level is mildly elevated.  INR is elevated 2.  {Document critical care time when appropriate:1} {Document review of labs and clinical decision tools ie heart score, Chads2Vasc2 etc:1}  {Document your independent review of radiology images, and any outside records:1} {Document your discussion with family members, caretakers, and with consultants:1} {Document social determinants of health affecting pt's care:1} {Document your decision making why or why not admission, treatments were needed:1} Final Clinical Impression(s) / ED Diagnoses Final diagnoses:  None    Rx / DC Orders ED Discharge Orders     None

## 2022-11-16 NOTE — ED Triage Notes (Addendum)
Patient with history of cirrhosis here with complaint of ascites and painful right foot swelling that started approximately one year ago. Patient report last paracentesis four years ago. Patient is alert, oriented, and in no apparent distress at this time.

## 2022-11-16 NOTE — ED Triage Notes (Signed)
No answer

## 2022-11-16 NOTE — ED Triage Notes (Signed)
The pt just stopped me and asked me  what he waa waiting on  2 other  employees including myself has called his name  he did not respond  I dont know where he was

## 2022-11-16 NOTE — ED Notes (Signed)
Contacted IV team, unable to get IV access at this time.

## 2022-11-17 ENCOUNTER — Other Ambulatory Visit: Payer: Self-pay

## 2022-11-17 ENCOUNTER — Encounter (HOSPITAL_COMMUNITY): Payer: Self-pay | Admitting: Internal Medicine

## 2022-11-17 DIAGNOSIS — I10 Essential (primary) hypertension: Secondary | ICD-10-CM | POA: Diagnosis not present

## 2022-11-17 DIAGNOSIS — K7460 Unspecified cirrhosis of liver: Secondary | ICD-10-CM | POA: Diagnosis not present

## 2022-11-17 DIAGNOSIS — D649 Anemia, unspecified: Secondary | ICD-10-CM

## 2022-11-17 DIAGNOSIS — K2960 Other gastritis without bleeding: Secondary | ICD-10-CM | POA: Diagnosis not present

## 2022-11-17 DIAGNOSIS — E8809 Other disorders of plasma-protein metabolism, not elsewhere classified: Secondary | ICD-10-CM | POA: Diagnosis present

## 2022-11-17 DIAGNOSIS — I252 Old myocardial infarction: Secondary | ICD-10-CM | POA: Diagnosis not present

## 2022-11-17 DIAGNOSIS — N5089 Other specified disorders of the male genital organs: Secondary | ICD-10-CM | POA: Diagnosis not present

## 2022-11-17 DIAGNOSIS — Z7982 Long term (current) use of aspirin: Secondary | ICD-10-CM | POA: Diagnosis not present

## 2022-11-17 DIAGNOSIS — K7200 Acute and subacute hepatic failure without coma: Secondary | ICD-10-CM | POA: Diagnosis not present

## 2022-11-17 DIAGNOSIS — K219 Gastro-esophageal reflux disease without esophagitis: Secondary | ICD-10-CM | POA: Diagnosis present

## 2022-11-17 DIAGNOSIS — K920 Hematemesis: Secondary | ICD-10-CM | POA: Diagnosis not present

## 2022-11-17 DIAGNOSIS — R109 Unspecified abdominal pain: Secondary | ICD-10-CM

## 2022-11-17 DIAGNOSIS — R64 Cachexia: Secondary | ICD-10-CM | POA: Diagnosis present

## 2022-11-17 DIAGNOSIS — D638 Anemia in other chronic diseases classified elsewhere: Secondary | ICD-10-CM | POA: Diagnosis present

## 2022-11-17 DIAGNOSIS — I509 Heart failure, unspecified: Secondary | ICD-10-CM | POA: Diagnosis not present

## 2022-11-17 DIAGNOSIS — F17210 Nicotine dependence, cigarettes, uncomplicated: Secondary | ICD-10-CM | POA: Diagnosis present

## 2022-11-17 DIAGNOSIS — D684 Acquired coagulation factor deficiency: Secondary | ICD-10-CM | POA: Diagnosis present

## 2022-11-17 DIAGNOSIS — E43 Unspecified severe protein-calorie malnutrition: Secondary | ICD-10-CM | POA: Diagnosis present

## 2022-11-17 DIAGNOSIS — K766 Portal hypertension: Secondary | ICD-10-CM | POA: Diagnosis present

## 2022-11-17 DIAGNOSIS — M6284 Sarcopenia: Secondary | ICD-10-CM | POA: Diagnosis present

## 2022-11-17 DIAGNOSIS — I8511 Secondary esophageal varices with bleeding: Secondary | ICD-10-CM | POA: Diagnosis not present

## 2022-11-17 DIAGNOSIS — I5022 Chronic systolic (congestive) heart failure: Secondary | ICD-10-CM | POA: Diagnosis present

## 2022-11-17 DIAGNOSIS — Z681 Body mass index (BMI) 19 or less, adult: Secondary | ICD-10-CM | POA: Diagnosis not present

## 2022-11-17 DIAGNOSIS — I110 Hypertensive heart disease with heart failure: Secondary | ICD-10-CM | POA: Diagnosis present

## 2022-11-17 DIAGNOSIS — E876 Hypokalemia: Secondary | ICD-10-CM | POA: Diagnosis not present

## 2022-11-17 DIAGNOSIS — I426 Alcoholic cardiomyopathy: Secondary | ICD-10-CM | POA: Diagnosis present

## 2022-11-17 DIAGNOSIS — I81 Portal vein thrombosis: Secondary | ICD-10-CM | POA: Diagnosis not present

## 2022-11-17 DIAGNOSIS — R600 Localized edema: Secondary | ICD-10-CM | POA: Diagnosis not present

## 2022-11-17 DIAGNOSIS — D6959 Other secondary thrombocytopenia: Secondary | ICD-10-CM | POA: Diagnosis present

## 2022-11-17 DIAGNOSIS — K7031 Alcoholic cirrhosis of liver with ascites: Principal | ICD-10-CM

## 2022-11-17 DIAGNOSIS — K2961 Other gastritis with bleeding: Secondary | ICD-10-CM | POA: Diagnosis not present

## 2022-11-17 DIAGNOSIS — E871 Hypo-osmolality and hyponatremia: Secondary | ICD-10-CM

## 2022-11-17 DIAGNOSIS — K7011 Alcoholic hepatitis with ascites: Secondary | ICD-10-CM | POA: Diagnosis not present

## 2022-11-17 DIAGNOSIS — R0981 Nasal congestion: Secondary | ICD-10-CM | POA: Diagnosis present

## 2022-11-17 DIAGNOSIS — I8501 Esophageal varices with bleeding: Secondary | ICD-10-CM | POA: Diagnosis not present

## 2022-11-17 DIAGNOSIS — K7040 Alcoholic hepatic failure without coma: Secondary | ICD-10-CM | POA: Diagnosis present

## 2022-11-17 DIAGNOSIS — R188 Other ascites: Secondary | ICD-10-CM | POA: Diagnosis not present

## 2022-11-17 DIAGNOSIS — K760 Fatty (change of) liver, not elsewhere classified: Secondary | ICD-10-CM | POA: Diagnosis present

## 2022-11-17 LAB — CBC
HCT: 23.2 % — ABNORMAL LOW (ref 39.0–52.0)
Hemoglobin: 7.6 g/dL — ABNORMAL LOW (ref 13.0–17.0)
MCH: 26 pg (ref 26.0–34.0)
MCHC: 32.8 g/dL (ref 30.0–36.0)
MCV: 79.5 fL — ABNORMAL LOW (ref 80.0–100.0)
Platelets: 166 10*3/uL (ref 150–400)
RBC: 2.92 MIL/uL — ABNORMAL LOW (ref 4.22–5.81)
RDW: 19.8 % — ABNORMAL HIGH (ref 11.5–15.5)
WBC: 5.7 10*3/uL (ref 4.0–10.5)
nRBC: 0 % (ref 0.0–0.2)

## 2022-11-17 LAB — COMPREHENSIVE METABOLIC PANEL
ALT: 10 U/L (ref 0–44)
AST: 39 U/L (ref 15–41)
Albumin: 1.7 g/dL — ABNORMAL LOW (ref 3.5–5.0)
Alkaline Phosphatase: 118 U/L (ref 38–126)
Anion gap: 10 (ref 5–15)
BUN: <5 mg/dL — ABNORMAL LOW (ref 6–20)
CO2: 21 mmol/L — ABNORMAL LOW (ref 22–32)
Calcium: 7.7 mg/dL — ABNORMAL LOW (ref 8.9–10.3)
Chloride: 96 mmol/L — ABNORMAL LOW (ref 98–111)
Creatinine, Ser: 0.7 mg/dL (ref 0.61–1.24)
GFR, Estimated: >60 mL/min (ref >60)
Glucose, Bld: 109 mg/dL — ABNORMAL HIGH (ref 70–99)
Potassium: 3.7 mmol/L (ref 3.5–5.1)
Sodium: 127 mmol/L — ABNORMAL LOW (ref 135–145)
Total Bilirubin: 3.7 mg/dL — ABNORMAL HIGH (ref 0.3–1.2)
Total Protein: 5.3 g/dL — ABNORMAL LOW (ref 6.5–8.1)

## 2022-11-17 LAB — URINALYSIS, ROUTINE W REFLEX MICROSCOPIC
Glucose, UA: NEGATIVE mg/dL
Hgb urine dipstick: NEGATIVE
Ketones, ur: 5 mg/dL — AB
Leukocytes,Ua: NEGATIVE
Nitrite: NEGATIVE
Protein, ur: NEGATIVE mg/dL
Specific Gravity, Urine: 1.023 (ref 1.005–1.030)
pH: 5 (ref 5.0–8.0)

## 2022-11-17 LAB — URIC ACID: Uric Acid, Serum: 4.7 mg/dL (ref 3.7–8.6)

## 2022-11-17 LAB — SEDIMENTATION RATE: Sed Rate: 14 mm/hr (ref 0–16)

## 2022-11-17 LAB — PHOSPHORUS: Phosphorus: 2.6 mg/dL (ref 2.5–4.6)

## 2022-11-17 LAB — C-REACTIVE PROTEIN: CRP: 0.6 mg/dL (ref <1.0)

## 2022-11-17 LAB — MAGNESIUM: Magnesium: 1.5 mg/dL — ABNORMAL LOW (ref 1.7–2.4)

## 2022-11-17 MED ORDER — SODIUM CHLORIDE 0.9 % IV SOLN
2.0000 g | INTRAVENOUS | Status: DC
  Administered 2022-11-17: 2 g via INTRAVENOUS
  Filled 2022-11-17: qty 20

## 2022-11-17 MED ORDER — THIAMINE MONONITRATE 100 MG PO TABS
100.0000 mg | ORAL_TABLET | Freq: Every day | ORAL | Status: DC
  Administered 2022-11-17 – 2022-11-21 (×4): 100 mg via ORAL
  Filled 2022-11-17 (×5): qty 1

## 2022-11-17 MED ORDER — SODIUM CHLORIDE 0.9% FLUSH
3.0000 mL | Freq: Two times a day (BID) | INTRAVENOUS | Status: DC
  Administered 2022-11-17 – 2022-11-20 (×6): 3 mL via INTRAVENOUS

## 2022-11-17 MED ORDER — LACTULOSE 10 GM/15ML PO SOLN
20.0000 g | Freq: Two times a day (BID) | ORAL | Status: DC
  Administered 2022-11-17 – 2022-11-20 (×5): 20 g via ORAL
  Filled 2022-11-17 (×8): qty 30

## 2022-11-17 MED ORDER — LORAZEPAM 1 MG PO TABS: 1.0000 mg | ORAL_TABLET | ORAL | Status: AC | PRN

## 2022-11-17 MED ORDER — ADULT MULTIVITAMIN W/MINERALS CH
1.0000 | ORAL_TABLET | Freq: Every day | ORAL | Status: DC
  Administered 2022-11-17 – 2022-11-21 (×4): 1 via ORAL
  Filled 2022-11-17 (×3): qty 1

## 2022-11-17 MED ORDER — ALBUMIN HUMAN 25 % IV SOLN
25.0000 g | Freq: Four times a day (QID) | INTRAVENOUS | Status: AC
  Administered 2022-11-17 (×4): 25 g via INTRAVENOUS
  Filled 2022-11-17 (×4): qty 100

## 2022-11-17 MED ORDER — LORAZEPAM 0.5 MG PO TABS
0.5000 mg | ORAL_TABLET | ORAL | Status: AC | PRN
  Administered 2022-11-17 – 2022-11-19 (×3): 0.5 mg via ORAL
  Filled 2022-11-17 (×3): qty 1

## 2022-11-17 MED ORDER — THIAMINE HCL 100 MG/ML IJ SOLN
100.0000 mg | Freq: Every day | INTRAMUSCULAR | Status: DC
  Filled 2022-11-17 (×2): qty 2

## 2022-11-17 MED ORDER — MAGNESIUM SULFATE 4 GM/100ML IV SOLN
4.0000 g | Freq: Once | INTRAVENOUS | Status: AC
  Administered 2022-11-17: 4 g via INTRAVENOUS
  Filled 2022-11-17: qty 100

## 2022-11-17 MED ORDER — FOLIC ACID 1 MG PO TABS
1.0000 mg | ORAL_TABLET | Freq: Every day | ORAL | Status: DC
  Administered 2022-11-17 – 2022-11-21 (×4): 1 mg via ORAL
  Filled 2022-11-17 (×5): qty 1

## 2022-11-17 MED ORDER — LIDOCAINE 5 % EX PTCH
1.0000 | MEDICATED_PATCH | Freq: Once | CUTANEOUS | Status: AC
  Administered 2022-11-19: 1 via TRANSDERMAL

## 2022-11-17 MED ORDER — POTASSIUM CHLORIDE 10 MEQ/100ML IV SOLN
10.0000 meq | INTRAVENOUS | Status: AC
  Filled 2022-11-17: qty 100

## 2022-11-17 MED ORDER — LIDOCAINE 5 % EX PTCH
1.0000 | MEDICATED_PATCH | CUTANEOUS | Status: DC
  Administered 2022-11-18 – 2022-11-21 (×4): 1 via TRANSDERMAL
  Filled 2022-11-17 (×4): qty 1

## 2022-11-17 MED ORDER — ENOXAPARIN SODIUM 40 MG/0.4ML IJ SOSY
40.0000 mg | PREFILLED_SYRINGE | INTRAMUSCULAR | Status: DC
  Administered 2022-11-17: 40 mg via SUBCUTANEOUS
  Filled 2022-11-17: qty 0.4

## 2022-11-17 NOTE — Consult Note (Addendum)
Referring Provider: Erin Hearing NP Primary Care Physician:  Charlott Rakes, MD Primary Gastroenterologist:  Dr. [name]  Reason for Consultation: Abdominal pain, alcohol associated cirrhosis  HPI: Richard [name] is a 57 y.o. male with a past medical history of colon cancer status post left hemicolectomy 2007, alcohol use disorder with associated hepatitis, decompensated cirrhosis with ascites and esophageal varices and alcohol associated pancreatitis.  He was last seen in our GI clinic 12/17/2021 following hospitalization for alcohol associated hepatitis with encephalopathy.  At that time, he was on Lasix 20 mg daily, spironolactone 100 mg daily, pantoprazole 40 mg daily and lactulose 3 times daily.  Esophageal surveillance EGD was completed 01/25/2022 which showed grade 1 varices to the lower third of the esophagus without stigmata of recent bleeding, small hiatal hernia without evidence of gastric varices.  A repeat EGD in 1 year was recommended.  He developed a worsening abdominal pain with distention the past few weeks therefore he presented to the ED 11/12/2022 for further evaluation. Labs in the ED showed a WBC count 7. Hemoglobin 8.6.  Hematocrit 25.4.  Platelet 197.  Sodium 126.  Potassium 2.6.  BUN < 5.  Creatinine 0.75.  Albumin 2.1.  Total bili 4.8.  Alk phos 144.  AST 49.  ALT 15.  INR 2.0.  Ammonia 53. S/P paracentesis, 1.6 L of peritoneal fluid removed.  No evidence of SBP.  Peritoneal LDH < 25.  Abdominal imaging has not been done.  CTAP 09/13/2022 showed evidence of ketosis with cirrhosis, gallstones with gallbladder wall thickening and evidence of a partial colectomy with mild wall thickening to the transverse colon.  He denies having any nausea or vomiting.  No dysphagia or heartburn. He takes Pantoprazole 40 mg daily. He describes having intermittent RLQ pain which is prominent when he has abdominal swelling.  His abdominal swelling started 2 to 3 weeks ago which has  progressively worsened.  He is taking furosemide 20 mg daily and spironolactone 100 mg daily.  He sometimes feels slightly short of breath when his abdomen is swollen.  No chest pain. He denies having confusion or mental fogginess.  He is taking lactulose once to twice daily and not 3 times daily as previously prescribed. He denies any alcohol use for the past year.  No family at the bedside.  GI PROCEDURES:  EGD 01/25/2022 by Dr. [name]: Grade 1 varices 4 columns were found in the lower third of the esophagus, no stigmata of recent bleeding. Esophagus was otherwise normal.  The stomach revealed mild portal gastropathy but was otherwise normal. The examined duodenum was normal. The cardia and gastric fundus were normal on retroflexion.  Small hiatal hernia.  No proximal gastric varices appreciated. Repeat surveillance EGD in 1 year  Colonoscopy 12/14/2019:   EGD 12/14/2019: LA grade B flux esophagitis with no bleeding Small hiatal hernia Congestive gastropathy Normal duodenum No specimens collected  A. ILEOCEAL VALVE, BIOPSY:  -  Benign colonic mucosa with focal erosions  -  No active inflammation or evidence of microscopic colitis  -  No high-grade dysplasia or malignancy identified   B. 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   Alcoholic hepatitis    Alcoholic ketoacidosis [id number]   Ascites 12/13/2019   Aspiration pneumonia ([place]) 08/20/2016   Chest pain of uncertain etiology    Chronic systolic CHF (congestive heart failure) ([place])    Cirrhosis (HCC)    Colon cancer ([place])    DCM (dilated cardiomyopathy) ([place])    Drug abuse  ([place]) 01/07/2020   Elevated troponin 06/27/2018   ETOH abuse    Gastropathy 08/14/2018   Heme positive stool 11/13/2017   Hepatic steatosis 08/21/2016   High anion gap metabolic acidosis 123456   History of colon cancer    HTN (hypertension)    Hypertensive urgency 06/27/2018   Hypoglycemia 06/27/2018   Hypokalemia 01/07/2020   Hypomagnesemia    Hypophosphatemia    Lactic acidosis 08/20/2016   Leukocytosis 01/07/2020   Neuropathy    Pancreatitis 08/2018   Polyp of ascending colon    Prolonged Q-T interval on ECG    Prolonged QT interval    Protein-calorie malnutrition, severe 05/02/2018   PUD (peptic ulcer disease)    SBP (spontaneous bacterial peritonitis) ([place]) 01/07/2020   Sepsis ([place]) 08/20/2016   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   SVT (supraventricular tachycardia)    Thrombocytopenia ([place]) 08/21/2016    Past Surgical History:  Procedure Laterality Date   BIOPSY  12/14/2019   Procedure: BIOPSY;  Surgeon: Mauri Pole, MD;  Location: [facility];  Service: Endoscopy;;   catherization  2007   COLONOSCOPY WITH PROPOFOL N/A 12/14/2019   Procedure: COLONOSCOPY WITH PROPOFOL;  Surgeon: Mauri Pole, MD;  Location: [facility];  Service: Endoscopy;  Laterality: N/A;   ESOPHAGOGASTRODUODENOSCOPY (EGD) WITH PROPOFOL N/A 12/14/2019   Procedure: ESOPHAGOGASTRODUODENOSCOPY (EGD) WITH PROPOFOL;  Surgeon: Mauri Pole, MD;  Location: [facility];  Service: Endoscopy;  Laterality: N/A;   HERNIA REPAIR  1967   1 x at birth and at 57 years old   [place]  2007   OPEN REDUCTION INTERNAL FIXATION (ORIF) HAND Right 2012   3rd  digit   POLYPECTOMY  12/14/2019   Procedure: POLYPECTOMY;  Surgeon: Mauri Pole, MD;  Location: [facility];  Service: Endoscopy;;    Prior to Admission medications   Medication Sig Start Date End Date Taking? Authorizing Provider  furosemide (LASIX) 20 MG tablet TAKE 1 TABLET(20 MG) BY MOUTH  DAILY Patient taking differently: Take 20 mg by mouth daily. 11/11/22  Yes Newlin, Charlane Ferretti, MD  lactulose (CHRONULAC) 10 GM/15ML solution TAKE 30 ML BY MOUTH TWICE DAILY Patient taking differently: Take 20 g by mouth 2 (two) times daily. 10/28/22  Yes Charlott Rakes, MD  naloxone (NARCAN) nasal spray 4 mg/0.1 mL Place 1 spray into the nose as needed (accidental overdose). 07/06/22  Yes [provider]  oxyCODONE-acetaminophen (PERCOCET) 10-325 MG tablet Take 1 tablet by mouth 4 (four) times daily as needed for pain. 08/26/22  Yes [provider]  pantoprazole (PROTONIX) 40 MG tablet TAKE 1 TABLET(40 MG) BY MOUTH DAILY Patient taking differently: Take 40 mg by mouth daily. 11/11/22  Yes Charlott Rakes, MD  potassium chloride SA (KLOR-CON M) 20 MEQ tablet Take 1 tablet (20 mEq total) by mouth daily. 10/27/22  Yes Charlott Rakes, MD  ferrous gluconate (FERGON) 324 MG tablet Take 1 tablet (324 mg total) by mouth 2 (two) times daily with a meal. Patient not taking: Reported on 11/17/2022 10/27/22   Charlott Rakes, MD  folic acid (FOLVITE) 1 MG tablet Take 1 tablet (1 mg total) by mouth  daily. Patient not taking: Reported on 11/17/2022 10/02/22   Aline August, MD  magnesium chloride (SLOW-MAG) 64 MG TBEC SR tablet Take 2 tablets (128 mg total) by mouth daily. Patient not taking: Reported on 11/17/2022 10/08/22   Blue, Soijett A, PA-C  spironolactone (ALDACTONE) 100 MG tablet Take 1 tablet (100 mg total) by mouth daily. Patient not taking: Reported on 11/17/2022 11/05/21 09/29/22  Kathie Dike, MD  thiamine 100 MG tablet Take 1 tablet (100 mg total) by mouth daily. Patient not taking: Reported on 11/17/2022 11/06/21   Kathie Dike, MD    Current Facility-Administered Medications  Medication Dose Route Frequency Provider Last Rate Last Admin   albumin human 25 % solution 25 g  25 g Intravenous Q6H Irene Pap N, DO   Stopped at 11/17/22 [id number]   cefTRIAXone (ROCEPHIN) 2 g in sodium chloride  0.9 % 100 mL IVPB  2 g Intravenous Q24H Hall, Carole N, DO       enoxaparin (LOVENOX) injection 40 mg  40 mg Subcutaneous Q24H [name], NP       sodium chloride flush (NS) 0.9 % injection 3 mL  3 mL Intravenous Q12H [name], NP       Current Outpatient Medications  Medication Sig Dispense Refill   furosemide (LASIX) 20 MG tablet TAKE 1 TABLET(20 MG) BY MOUTH DAILY (Patient taking differently: Take 20 mg by mouth daily.) 90 tablet 0   lactulose (CHRONULAC) 10 GM/15ML solution TAKE 30 ML BY MOUTH TWICE DAILY (Patient taking differently: Take 20 g by mouth 2 (two) times daily.) 1892 mL 1   naloxone (NARCAN) nasal spray 4 mg/0.1 mL Place 1 spray into the nose as needed (accidental overdose).     oxyCODONE-acetaminophen (PERCOCET) 10-325 MG tablet Take 1 tablet by mouth 4 (four) times daily as needed for pain.     pantoprazole (PROTONIX) 40 MG tablet TAKE 1 TABLET(40 MG) BY MOUTH DAILY (Patient taking differently: Take 40 mg by mouth daily.) 90 tablet 0   potassium chloride SA (KLOR-CON M) 20 MEQ tablet Take 1 tablet (20 mEq total) by mouth daily. 30 tablet 3   ferrous gluconate (FERGON) 324 MG tablet Take 1 tablet (324 mg total) by mouth 2 (two) times daily with a meal. (Patient not taking: Reported on 11/17/2022) 60 tablet 3   folic acid (FOLVITE) 1 MG tablet Take 1 tablet (1 mg total) by mouth daily. (Patient not taking: Reported on 11/17/2022) 30 tablet 0   magnesium chloride (SLOW-MAG) 64 MG TBEC SR tablet Take 2 tablets (128 mg total) by mouth daily. (Patient not taking: Reported on 11/17/2022) 60 tablet 0   spironolactone (ALDACTONE) 100 MG tablet Take 1 tablet (100 mg total) by mouth daily. (Patient not taking: Reported on 11/17/2022) 30 tablet 2   thiamine 100 MG tablet Take 1 tablet (100 mg total) by mouth daily. (Patient not taking: Reported on 11/17/2022) 30 tablet 1    Allergies as of 11/16/2022 - Review Complete 11/16/2022  Allergen Reaction Noted   Aspirin Other (See  Comments) 08/20/2016   Aspirin Other (See Comments) 04/03/2021   Penicillins Hives 01/09/2012   Penicillins Hives 04/03/2021    Family History  Problem Relation Age of Onset   Hypertension Mother    Colon cancer Father    Cancer Sister        type unknown   Kidney disease Sister        dialysis   Colon cancer Cousin  x 2   CAD Neg Hx    Stroke Neg Hx    Diabetes Neg Hx    Stomach cancer Neg Hx    Esophageal cancer Neg Hx    Pancreatic cancer Neg Hx    Colon polyps Neg Hx     Social History   Socioeconomic History   Marital status: Single    Spouse name: Not on file   Number of children: Not on file   Years of education: Not on file   Highest education level: Not on file  Occupational History   Not on file  Tobacco Use   Smoking status: Every Day    Packs/day: 1.00    Types: Cigarettes   Smokeless tobacco: Never  Vaping Use   Vaping Use: Never used  Substance and Sexual Activity   Alcohol use: Not Currently    Alcohol/week: 2.0 standard drinks of alcohol    Types: 1 Cans of beer, 1 Shots of liquor per week    Comment: last drink in February   Drug use: Yes    Types: Marijuana   Sexual activity: Not Currently  Other Topics Concern   Not on file  Social History Narrative   Not on file   Social Determinants of Health   Financial Resource Strain: High Risk (06/03/2020)   Overall Financial Resource Strain (CARDIA)    Difficulty of Paying Living Expenses: Very hard  Food Insecurity: Food Insecurity Present (09/28/2022)   Hunger Vital Sign    Worried About Running Out of Food in the Last Year: Sometimes true    Ran Out of Food in the Last Year: Sometimes true  Transportation Needs: No Transportation Needs (09/28/2022)   PRAPARE - Hydrologist (Medical): No    Lack of Transportation (Non-Medical): No  Physical Activity: Not on file  Stress: Not on file  Social Connections: Not on file  Intimate Partner Violence: Not At Risk  (09/28/2022)   Humiliation, Afraid, Rape, and Kick questionnaire    Fear of Current or Ex-Partner: No    Emotionally Abused: No    Physically Abused: No    Sexually Abused: No    Review of Systems: Gen: + Weight loss. CV: Denies chest pain, palpitations or edema. Resp: Denies cough, shortness of breath of hemoptysis.  GI: See HPI. GU : Denies urinary burning, blood in urine, increased urinary frequency or incontinence. MS: Denies joint pain, muscles aches or weakness. Derm: Denies rash, itchiness, skin lesions or unhealing ulcers. Psych: Denies depression, anxiety, memory loss or confusion. Heme: Denies easy bruising, bleeding. Neuro:  Denies headaches, dizziness or paresthesias. Endo:  Denies any problems with DM, thyroid or adrenal function.  Physical Exam: Vital signs in last 24 hours: Temp:  [98.1 F (36.7 C)-98.4 F (36.9 C)] 98.3 F (36.8 C) (02/15 0733) Pulse Rate:  [73-93] 81 (02/15 0715) Resp:  [14-22] 21 (02/15 0715) BP: (90-110)/(57-81) 102/61 (02/15 0715) SpO2:  [94 %-99 %] 96 % (02/15 0715)   General: Alert sarcopenia.  57 year old male in no acute distress. Head:  Normocephalic and atraumatic. Eyes:  No scleral icterus. Conjunctiva pink. Ears:  Normal auditory acuity. Nose:  No deformity, discharge or lesions. Mouth:  Dentition intact. No ulcers or lesions.  Neck:  Supple. No lymphadenopathy or thyromegaly.  Lungs: Breath sounds clear throughout. No wheezes, rhonchi or crackles.  Heart: Regular rate and rhythm, no murmurs. Abdomen: Protuberant with mild distention, mild residual ascites status post paracentesis.  Mild tenderness to the RLQ  without rebound or guarding.  Positive bowel sounds to all 4 quadrants.  No hepatosplenomegaly. Rectal: Deferred. Musculoskeletal:  Symmetrical without gross deformities.  Pulses:  Normal pulses noted. Extremities:  Without clubbing or edema. Neurologic:  Alert and  oriented x 4. No focal deficits.  Skin:  Intact without  significant lesions or rashes. Psych:  Alert and cooperative. Normal mood and affect.  Intake/Output from previous day: 02/14 0701 - 02/15 0700 In: 100 [IV Piggyback:100] Out: -  Intake/Output this shift: No intake/output data recorded.  Lab Results: Recent Labs    11/16/22 1642 11/17/22 0520  WBC 7.0 5.7  HGB 8.6* 7.6*  HCT 25.4* 23.2*  PLT 197 166   BMET Recent Labs    11/16/22 1642 11/17/22 0340  NA 126* 127*  K 2.6* 3.7  CL 91* 96*  CO2 23 21*  GLUCOSE 105* 109*  BUN <5* <5*  CREATININE 0.75 0.70  CALCIUM 8.4* 7.7*   LFT Recent Labs    11/17/22 0340  PROT 5.3*  ALBUMIN 1.7*  AST 39  ALT 10  ALKPHOS 118  BILITOT 3.7*   PT/INR Recent Labs    11/16/22 1642  LABPROT 22.7*  INR 2.0*   Hepatitis Panel No results for input(s): "HEPBSAG", "HCVAB", "HEPAIGM", "HEPBIGM" in the last 72 hours.    Studies/Results: No results found.  IMPRESSION/PLAN:  57 year old male with a history of alcohol associated hepatitis, cirrhosis with EV and hepatic encephalopathy and alcohol associated pancreatitis presenting with decompensated cirrhosis with ascites. S/P paracentesis, removed 1.6 L of fluid, no SBP.  Normal renal function.  MELD 20/MELD NA 27 per labs 11/16/2022.  Poor historian without overt encephalopathy. -Agree with albumin IV every 6 hours -2 g low-sodium diet -Defer diuretic at recommendations to Dr. Candis Schatz -Lactulose 20 g p.o. twice daily  Acute on chronic anemia. Hg 8.6 -> 7.6. No overt GI bleeding. -FOBT -Continue Rocephin prophylaxis for now, discontinue if FOBT negative. -Consider EGD during this hospitalization if patient demonstrates GI bleed or + FOBT.   Coagulopathy secondary to cirrhosis  Hypokalemia -KCl replacement per the hospitalist  Hyponatremia, secondary to cirrhosis and diuretics  For nutritional status, hypoalbuminemia, sarcopenia appearance Recommend nutritionist consult   Noralyn Pick  11/17/2022,  10:51AM  I have taken a history, reviewed the chart and examined the patient. I performed a substantive portion of this encounter, including complete performance of at least one of the key components, in conjunction with the APP. I agree with the APP's note, impression and recommendations  57 year old male with history of alcoholic hepatitis and decompensated cirrhosis with ascites and hepatic encephalopathy, admitted with worsening abdominal distention and LE edema, underwent therapeutic paracentesis with 1.6 liters removed, ascitic fluid not consistent with SBP and SAAG calculation not able to be accurately calculated due to ascitic fluid albumin level reported as <1.5, consistent with ascites secondary to portal hypertension. He has significant hyponatremia which is likely related to his hypervolemic state.  He maintains he is taking his lasix daily but did not demonstrate much insight into a low sodium diet. His renal function is preserved and although his hemoglobin has drifted a little, he does not have any overt GI bleeding.    I think antibiotics can be safely discontinued given very low concern for significant GI bleed. Similarly, in the absence of AKI or SBP, I think we can stop albumin after today.  I would recommend resuming lasix 40 mg PO daily and aldactone 100 mg PO daily while in hospital to  see how his sodium level and edema/ascites respond.    Jeyden Coffelt E. Candis Schatz, MD [facility]

## 2022-11-17 NOTE — Plan of Care (Signed)
  Problem: Education: Goal: Knowledge of General Education information will improve Description Including pain rating scale, medication(s)/side effects and non-pharmacologic comfort measures Outcome: Progressing   

## 2022-11-17 NOTE — ED Notes (Signed)
ED TO INPATIENT HANDOFF REPORT  ED Nurse Name and Phone #: Threasa Beards [phone]  S Name/Age/Gender Richard [name] Lenk 57 y.o. male Room/Bed: 039C/039C  Code Status   Code Status: Full Code  Home/SNF/Other Home Patient oriented to: self, place, time, and situation Is this baseline? Yes   Triage Complete: Triage complete  Chief Complaint Abdominal pain [R10.9]  Triage Note Patient with history of cirrhosis here with complaint of ascites and painful right foot swelling that started approximately one year ago. Patient report last paracentesis four years ago. Patient is alert, oriented, and in no apparent distress at this time.  No answer  The pt just stopped me and asked me  what he waa waiting on  2 other  employees including myself has called his name  he did not respond  I dont know where he was   Allergies Allergies  Allergen Reactions   Aspirin Other (See Comments)    Caused acid reflux   Penicillins Hives    Has patient had a PCN reaction causing immediate rash, facial/tongue/throat swelling, SOB or lightheadedness with hypotension: yes Has patient had a PCN reaction causing severe rash involving mucus membranes or skin necrosis: no Has patient had a PCN reaction that required hospitalization: no Has patient had a PCN reaction occurring within the last 10 years: no If all of the above answers are "NO", then may proceed with Cephalosporin use.     Level of Care/Admitting Diagnosis ED Disposition     ED Disposition  Admit   Condition  --   Comment  Hospital Area: [place] [id number]  Level of Care: Med-Surg [16]  May place patient in observation at [facility] or [place] if equivalent level of care is available:: Yes  Covid Evaluation: Confirmed COVID Negative  Diagnosis: Abdominal pain [id number]  Admitting Physician: [name] [[id number]]  Attending Physician: [name] [[id number]]          B Medical/Surgery History Past Medical  History:  Diagnosis Date   Abnormal nuclear cardiac imaging test    Acid reflux    Acute pancreatitis 08/14/2018   Alcohol withdrawal delirium ([place]) 123456   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   Alcoholic hepatitis    Alcoholic ketoacidosis [id number]   Ascites 12/13/2019   Aspiration pneumonia ([place]) 08/20/2016   Chest pain of uncertain etiology    Chronic systolic CHF (congestive heart failure) ([place])    Cirrhosis ([place])    Colon cancer ([place])    DCM (dilated cardiomyopathy) ([place])    Drug abuse ([place]) 01/07/2020   Elevated troponin 06/27/2018   ETOH abuse    Gastropathy 08/14/2018   Heme positive stool 11/13/2017   Hepatic steatosis 08/21/2016   High anion gap metabolic acidosis 123456   History of colon cancer    HTN (hypertension)    Hypertensive urgency 06/27/2018   Hypoglycemia 06/27/2018   Hypokalemia 01/07/2020   Hypomagnesemia    Hypophosphatemia    Lactic acidosis 08/20/2016   Leukocytosis 01/07/2020   Neuropathy    Pancreatitis 08/2018   Polyp of ascending colon    Prolonged Q-T interval on ECG    Prolonged QT interval    Protein-calorie malnutrition, severe 05/02/2018   PUD (peptic ulcer disease)    SBP (spontaneous bacterial peritonitis) ([place]) 01/07/2020   Sepsis ([place]) 08/20/2016   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   SVT (supraventricular tachycardia)    Thrombocytopenia ([place]) 08/21/2016   Past Surgical History:  Procedure Laterality Date   BIOPSY  12/14/2019  Procedure: BIOPSY;  Surgeon: Mauri Pole, MD;  Location: [facility];  Service: Endoscopy;;   catherization  2007   COLONOSCOPY WITH PROPOFOL N/A 12/14/2019   Procedure: COLONOSCOPY WITH PROPOFOL;  Surgeon: Mauri Pole, MD;  Location: [facility];  Service: Endoscopy;  Laterality: N/A;   ESOPHAGOGASTRODUODENOSCOPY (EGD) WITH PROPOFOL N/A 12/14/2019   Procedure: ESOPHAGOGASTRODUODENOSCOPY (EGD) WITH PROPOFOL;  Surgeon: Mauri Pole, MD;  Location: [facility];   Service: Endoscopy;  Laterality: N/A;   HERNIA REPAIR  1967   1 x at birth and at 57 years old   [place]  2007   OPEN REDUCTION INTERNAL FIXATION (ORIF) HAND Right 2012   3rd  digit   POLYPECTOMY  12/14/2019   Procedure: POLYPECTOMY;  Surgeon: Mauri Pole, MD;  Location: [facility];  Service: Endoscopy;;     A IV Location/Drains/Wounds Patient Lines/Drains/Airways Status     Active Line/Drains/Airways     Name Placement date Placement time Site Days   Peripheral IV 11/16/22 22 G 1.75" Anterior;Right Forearm 11/16/22  2110  Forearm  1   Peripheral IV 11/17/22 20 G 1.88" Right;Lateral Forearm 11/17/22  0535  Forearm  less than 1            Intake/Output Last 24 hours  Intake/Output Summary (Last 24 hours) at 11/17/2022 1105 Last data filed at 11/16/2022 2235 Gross per 24 hour  Intake 100 ml  Output --  Net 100 ml    Labs/Imaging Results for orders placed or performed during the hospital encounter of 11/16/22 (from the past 48 hour(s))  Urinalysis, Routine w reflex microscopic -Urine, Clean Catch     Status: Abnormal   Collection Time: 11/16/22  1:25 AM  Result Value Ref Range   Color, Urine AMBER (A) YELLOW    Comment: BIOCHEMICALS MAY BE AFFECTED BY COLOR   APPearance CLEAR CLEAR   Specific Gravity, Urine 1.023 1.005 - 1.030   pH 5.0 5.0 - 8.0   Glucose, UA NEGATIVE NEGATIVE mg/dL   Hgb urine dipstick NEGATIVE NEGATIVE   Bilirubin Urine MODERATE (A) NEGATIVE   Ketones, ur 5 (A) NEGATIVE mg/dL   Protein, ur NEGATIVE NEGATIVE mg/dL   Nitrite NEGATIVE NEGATIVE   Leukocytes,Ua NEGATIVE NEGATIVE    Comment: Performed at [facility], [place] [address], [place], [place] [address]  CBC with Differential     Status: Abnormal   Collection Time: 11/16/22  4:42 PM  Result Value Ref Range   WBC 7.0 4.0 - 10.5 K/uL   RBC 3.24 (L) 4.22 - 5.81 MIL/uL   Hemoglobin 8.6 (L) 13.0 - 17.0 g/dL   HCT 25.4 (L) 39.0 - 52.0 %   MCV 78.4 (L) 80.0 -  100.0 fL   MCH 26.5 26.0 - 34.0 pg   MCHC 33.9 30.0 - 36.0 g/dL   RDW 19.6 (H) 11.5 - 15.5 %   Platelets 197 150 - 400 K/uL   nRBC 0.0 0.0 - 0.2 %   Neutrophils Relative % 65 %   Neutro Abs 4.6 1.7 - 7.7 K/uL   Lymphocytes Relative 19 %   Lymphs Abs 1.3 0.7 - 4.0 K/uL   Monocytes Relative 12 %   Monocytes Absolute 0.9 0.1 - 1.0 K/uL   Eosinophils Relative 2 %   Eosinophils Absolute 0.1 0.0 - 0.5 K/uL   Basophils Relative 1 %   Basophils Absolute 0.1 0.0 - 0.1 K/uL   Immature Granulocytes 1 %   Abs Immature Granulocytes 0.04 0.00 - 0.07  K/uL    Comment: Performed at [facility], [place] [address], [place], [place] [address]  Comprehensive metabolic panel     Status: Abnormal   Collection Time: 11/16/22  4:42 PM  Result Value Ref Range   Sodium 126 (L) 135 - 145 mmol/L   Potassium 2.6 (LL) 3.5 - 5.1 mmol/L    Comment: CRITICAL RESULT CALLED TO, READ BACK BY AND VERIFIED WITH K SAGE RN 11/16/2022 1746 B NUNNERY   Chloride 91 (L) 98 - 111 mmol/L   CO2 23 22 - 32 mmol/L   Glucose, Bld 105 (H) 70 - 99 mg/dL    Comment: Glucose reference range applies only to samples taken after fasting for at least 8 hours.   BUN <5 (L) 6 - 20 mg/dL   Creatinine, Ser 0.75 0.61 - 1.24 mg/dL   Calcium 8.4 (L) 8.9 - 10.3 mg/dL   Total Protein 6.7 6.5 - 8.1 g/dL   Albumin 2.1 (L) 3.5 - 5.0 g/dL   AST 49 (H) 15 - 41 U/L   ALT 15 0 - 44 U/L   Alkaline Phosphatase 144 (H) 38 - 126 U/L   Total Bilirubin 4.8 (H) 0.3 - 1.2 mg/dL   GFR, Estimated >60 >60 mL/min    Comment: (NOTE) Calculated using the CKD-EPI Creatinine Equation (2021)    Anion gap 12 5 - 15    Comment: Performed at [facility], [place] [address] [place], [place] [address]  Lipase, blood     Status: None   Collection Time: 11/16/22  4:42 PM  Result Value Ref Range   Lipase 30 11 - 51 U/L    Comment: Performed at [facility], [place] [address], [place], [place] [address]  Protime-INR     Status: Abnormal   Collection Time:  11/16/22  4:42 PM  Result Value Ref Range   Prothrombin Time 22.7 (H) 11.4 - 15.2 seconds   INR 2.0 (H) 0.8 - 1.2    Comment: (NOTE) INR goal varies based on device and disease states. Performed at [facility], [place] [address], [place], [place] [address]   APTT     Status: Abnormal   Collection Time: 11/16/22  4:42 PM  Result Value Ref Range   aPTT 45 (H) 24 - 36 seconds    Comment:        IF BASELINE aPTT IS ELEVATED, SUGGEST PATIENT RISK ASSESSMENT BE USED TO DETERMINE APPROPRIATE ANTICOAGULANT THERAPY. Performed at [facility], [place] [address] [place], [place] [address]   Ammonia     Status: Abnormal   Collection Time: 11/16/22  4:42 PM  Result Value Ref Range   Ammonia 53 (H) 9 - 35 umol/L    Comment: Performed at [facility], [place] [address], [place], [place] [address]  Brain natriuretic peptide     Status: None   Collection Time: 11/16/22  4:42 PM  Result Value Ref Range   B Natriuretic Peptide 37.0 0.0 - 100.0 pg/mL    Comment: Performed at [facility] [address] [place], [place] [address]  Lactate dehydrogenase (pleural or peritoneal fluid)     Status: Abnormal   Collection Time: 11/16/22  9:35 PM  Result Value Ref Range   LD, Fluid <25 (H) 3 - 23 U/L    Comment: (NOTE) Results should be evaluated in conjunction with serum values    Fluid Type-FLDH PERITONEAL CAVITY     Comment: Performed at  [facility], [place] [address] [place], [place] [address]  Glucose, pleural or peritoneal fluid     Status: None   Collection Time: 11/16/22  9:35 PM  Result Value Ref Range   Glucose, Fluid 123 mg/dL    Comment: (NOTE) No normal range established for this test Results should be evaluated in conjunction with serum values    Fluid Type-FGLU PERITONEAL CAVITY     Comment: Performed at [facility] [address], [place], [place] [address]  Protein, pleural or peritoneal fluid     Status: None   Collection Time: 11/16/22  9:35 PM   Result Value Ref Range   Total protein, fluid <3.0 g/dL    Comment: (NOTE) No normal range established for this test Results should be evaluated in conjunction with serum values    Fluid Type-FTP PERITONEAL CAVITY     Comment: Performed at [facility] [address] [place], [place] [address]  Albumin, pleural or peritoneal fluid      Status: None   Collection Time: 11/16/22  9:35 PM  Result Value Ref Range   Albumin, Fluid <1.5 g/dL    Comment: (NOTE) No normal range established for this test Results should be evaluated in conjunction with serum values    Fluid Type-FALB PERITONEAL CAVITY     Comment: Performed at [facility] [address] [place], [place] [address]  Body fluid culture w Gram Stain     Status: None (Preliminary result)   Collection Time: 11/16/22  9:35 PM   Specimen: Peritoneal Cavity; Peritoneal Fluid  Result Value Ref Range   Specimen Description PERITONEAL CAVITY    Special Requests NONE    Gram Stain      FEW WBC PRESENT, PREDOMINANTLY MONONUCLEAR NO ORGANISMS SEEN    Culture      NO GROWTH < 12 HOURS Performed at [facility], [place] [address] [place], [place] [address]    Report Status PENDING   Body fluid cell count with differential     Status: Abnormal   Collection Time: 11/16/22  9:35 PM  Result Value Ref Range   Fluid Type-FCT Peritoneal    Color, Fluid YELLOW    Appearance, Fluid HAZY (A) CLEAR   Total Nucleated Cell Count, Fluid 138 0 - 1,000 cu mm   Neutrophil Count, Fluid 7 0 - 25 %   Lymphs, Fluid 34 %   Monocyte-Macrophage-Serous Fluid 58 50 - 90 %   Eos, Fluid 0 %   Other Cells, Fluid FEW MESOTHELIAL CELLS AND RARE BASOPHIL %    Comment: Performed at [facility], [place] [address] [place], [place] [address]  Comprehensive metabolic panel     Status: Abnormal   Collection Time: 11/17/22  3:40 AM  Result Value Ref Range   Sodium 127 (L) 135 - 145 mmol/L   Potassium 3.7 3.5 - 5.1 mmol/L   Chloride 96  (L) 98 - 111 mmol/L   CO2 21 (L) 22 - 32 mmol/L   Glucose, Bld 109 (H) 70 - 99 mg/dL    Comment: Glucose reference range applies only to samples taken after fasting for at least 8 hours.   BUN <5 (L) 6 - 20 mg/dL   Creatinine, Ser 0.70 0.61 - 1.24 mg/dL   Calcium 7.7 (L) 8.9 - 10.3 mg/dL   Total Protein 5.3 (L) 6.5 - 8.1 g/dL   Albumin 1.7 (L) 3.5 - 5.0 g/dL   AST 39  15 - 41 U/L   ALT 10 0 - 44 U/L   Alkaline Phosphatase 118 38 - 126 U/L   Total Bilirubin 3.7 (H) 0.3 - 1.2 mg/dL   GFR, Estimated >60 >60 mL/min    Comment: (NOTE) Calculated using the CKD-EPI Creatinine Equation (2021)    Anion gap 10 5 - 15    Comment: Performed at [facility] [address] [place], [place] [address]  Magnesium     Status: Abnormal   Collection Time: 11/17/22  3:40 AM  Result Value Ref Range   Magnesium 1.5 (L) 1.7 - 2.4 mg/dL    Comment: Performed at [facility] [address], [place], [place] [address]  Phosphorus     Status: None   Collection Time: 11/17/22  3:40 AM  Result Value Ref Range   Phosphorus 2.6 2.5 - 4.6 mg/dL    Comment: Performed at [facility] [address] [place], [place] [address]  CBC     Status: Abnormal   Collection Time: 11/17/22  5:20 AM  Result Value Ref Range   WBC 5.7 4.0 - 10.5 K/uL   RBC 2.92 (L) 4.22 - 5.81 MIL/uL   Hemoglobin 7.6 (L) 13.0 - 17.0 g/dL   HCT 23.2 (L) 39.0 - 52.0 %   MCV 79.5 (L) 80.0 - 100.0 fL   MCH 26.0 26.0 - 34.0 pg   MCHC 32.8 30.0 - 36.0 g/dL   RDW 19.8 (H) 11.5 - 15.5 %   Platelets 166 150 - 400 K/uL    Comment: REPEATED TO VERIFY   nRBC 0.0 0.0 - 0.2 %    Comment: Performed at [facility], [place] [address], [place], [place] [address]   No results found.  Pending Labs Unresulted Labs (From admission, onward)     Start     Ordered   11/24/22 0500  Creatinine, serum  (enoxaparin (LOVENOX)    CrCl >/= 30 ml/min)  Weekly,   R     Comments: while on enoxaparin therapy    11/17/22 0816   11/18/22  0500  HIV Antibody (routine testing w rflx)  (HIV Antibody (Routine testing w reflex) panel)  Tomorrow morning,   R        11/17/22 0331   11/18/22 0500  Comprehensive metabolic panel  Tomorrow morning,   R        11/17/22 0816   11/18/22 0500  CBC  Tomorrow morning,   R        11/17/22 0816   11/17/22 0816  CBC  (enoxaparin (LOVENOX)    CrCl >/= 30 ml/min)  Once,   R       Comments: Baseline for enoxaparin therapy IF NOT ALREADY DRAWN.  Notify MD if PLT < 100 K.    11/17/22 0816   11/17/22 0816  Creatinine, serum  (enoxaparin (LOVENOX)    CrCl >/= 30 ml/min)  Once,   R       Comments: Baseline for enoxaparin therapy IF NOT ALREADY DRAWN.    11/17/22 0816   11/17/22 0815  Uric acid  Once,   R        11/17/22 0814   11/17/22 0815  C-reactive protein  Once,   R        11/17/22 0814   11/17/22 0815  Sedimentation rate  Once,   R        11/17/22 0814   11/16/22 2135  Pathologist smear review  Once,   R        11/16/22 2135            Vitals/Pain Today's Vitals   11/17/22 0545 11/17/22 0630 11/17/22 0715 11/17/22 0733  BP: 101/62 98/62 102/61   Pulse:  85 81   Resp:  (!) 22 (!) 21   Temp:    98.3 F (36.8 C)  TempSrc:    Oral  SpO2:  94% 96%   PainSc:    3     Isolation Precautions No active isolations  Medications Medications  cefTRIAXone (ROCEPHIN) 2 g in sodium chloride 0.9 % 100 mL IVPB (has no administration in time range)  albumin human 25 % solution 25 g (25 g Intravenous New Bag/Given 11/17/22 1027)  potassium chloride 10 mEq in 100 mL IVPB (10 mEq Intravenous Not Given 11/17/22 0438)  enoxaparin (LOVENOX) injection 40 mg (40 mg Subcutaneous Given 11/17/22 1019)  sodium chloride flush (NS) 0.9 % injection 3 mL (3 mLs Intravenous Given 11/17/22 1030)  potassium chloride 10 mEq in 100 mL IVPB (0 mEq Intravenous Stopped 11/17/22 0139)  potassium chloride SA (KLOR-CON M) CR tablet 40 mEq (40 mEq Oral Given 11/16/22 2054)  magnesium oxide (MAG-OX) tablet 800 mg (800 mg  Oral Given 11/16/22 2054)  lidocaine-EPINEPHrine (XYLOCAINE W/EPI) 2 %-1:200000 (PF) injection 10 mL (10 mLs Intradermal Given by Other 11/16/22 2112)  cefTRIAXone (ROCEPHIN) 2 g in sodium chloride 0.9 % 100 mL IVPB (0 g Intravenous Stopped 11/16/22 2235)    Mobility walks     Focused Assessments    R Recommendations: See Admitting Provider Note  Report given to:   Additional Notes:

## 2022-11-17 NOTE — H&P (Signed)
History and Physical    Patient: Richard [name] [id number] DOB: 1966/08/14 DOA: 11/16/2022 DOS: the patient was seen and examined on 11/17/2022 PCP: Charlott Rakes, MD  Patient coming from: Home  Chief Complaint:  Chief Complaint  Patient presents with   Abdominal Pain   HPI: Richard [name] is a 57 y.o. 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  He presented to the ER yesterday afternoon on 2/14 with complaint of ascites and painful right foot swelling.  He told the triage staff that the foot swelling started 1 year ago.  He states last paracentesis 4 years prior.  Upon arrival patient was afebrile and hemodynamically stable.  Because of the large volume ascites EDP performed a paracentesis and send fluid down for cytology.  Fluid not consistent with SBP.  EDP discussed with GI Dr. Loletha Carrow who agreed and recommended hospitalist admission.  In addition to above patient's ammonia level was minimally elevated at 53, hemoglobin stable in his baseline range tween 7 and 8.  Magnesium was low at 1.5.  Albumin low 1.  No leukocytosis.  Urinalysis unremarkable.  Upon my evaluation patient he states his abdominal pain has nearly resolved post paracentesis.  He is primarily complaining of significant pain in the right foot which is markedly swollen and slightly erythematous and warm to the touch.  He states he is unable to apply a shoe and walk due to the level of pain and swelling.  He also has swelling in the left foot.  He states he has had chronic swelling in both feet for many years but states the swelling in the right foot has increased over the past 3 weeks and has become extremely painful.  He is also reporting significant sinus  congestion with yellow exudate that has been going on for more than 1 month but he states this concerns him.  Review of Systems: As mentioned in the history of present illness. All other systems reviewed and are negative. Past Medical History:  Diagnosis Date   Abnormal nuclear cardiac imaging test    Acid reflux    Acute pancreatitis 08/14/2018   Alcohol withdrawal delirium ([place]) 123456   Alcoholic cardiomyopathy ([place]) 123456   Alcoholic hepatitis    Alcoholic ketoacidosis [id number]   Ascites 12/13/2019   Aspiration pneumonia ([place]) 08/20/2016   Chest pain of uncertain etiology    Chronic systolic CHF (congestive heart failure) ([place])    Cirrhosis ([place])    Colon cancer ([place])    DCM (dilated cardiomyopathy) ([place])    Drug abuse ([place]) 01/07/2020   Elevated troponin 06/27/2018   ETOH abuse    Gastropathy 08/14/2018   Heme positive stool 11/13/2017   Hepatic steatosis 08/21/2016   High anion gap metabolic acidosis 123456   History of colon cancer    HTN (hypertension)    Hypertensive urgency 06/27/2018   Hypoglycemia 06/27/2018   Hypokalemia 01/07/2020   Hypomagnesemia    Hypophosphatemia    Lactic acidosis 08/20/2016   Leukocytosis 01/07/2020   Neuropathy    Pancreatitis 08/2018   Polyp of ascending colon    Prolonged Q-T interval on ECG    Prolonged QT interval    Protein-calorie malnutrition, severe 05/02/2018   PUD (peptic ulcer disease)    SBP (spontaneous bacterial peritonitis) ([place]) 01/07/2020   Sepsis ([place]) 08/20/2016  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   SVT (supraventricular tachycardia)    Thrombocytopenia ([place]) 08/21/2016   Past Surgical History:  Procedure Laterality Date   BIOPSY  12/14/2019   Procedure: BIOPSY;  Surgeon: Mauri Pole, MD;  Location: [facility];  Service: Endoscopy;;   catherization  2007   COLONOSCOPY WITH PROPOFOL N/A 12/14/2019   Procedure: COLONOSCOPY WITH PROPOFOL;  Surgeon: Mauri Pole, MD;  Location: [facility]  ENDOSCOPY;  Service: Endoscopy;  Laterality: N/A;   ESOPHAGOGASTRODUODENOSCOPY (EGD) WITH PROPOFOL N/A 12/14/2019   Procedure: ESOPHAGOGASTRODUODENOSCOPY (EGD) WITH PROPOFOL;  Surgeon: Mauri Pole, MD;  Location: [facility];  Service: Endoscopy;  Laterality: N/A;   HERNIA REPAIR  1969   1 x at birth and at 57 years old   [place]  2007   OPEN REDUCTION INTERNAL FIXATION (ORIF) HAND Right 2012   3rd  digit   POLYPECTOMY  12/14/2019   Procedure: POLYPECTOMY;  Surgeon: Mauri Pole, MD;  Location: [facility];  Service: Endoscopy;;   Social History:  reports that he has been smoking cigarettes. He has been smoking an average of 1 pack per day. He has never used smokeless tobacco. He reports that he does not currently use alcohol after a past usage of about 2.0 standard drinks of alcohol per week. He reports current drug use. Drug: Marijuana.  Allergies  Allergen Reactions   Aspirin Other (See Comments)    Caused acid reflux   Penicillins Hives    Has patient had a PCN reaction causing immediate rash, facial/tongue/throat swelling, SOB or lightheadedness with hypotension: yes Has patient had a PCN reaction causing severe rash involving mucus membranes or skin necrosis: no Has patient had a PCN reaction that required hospitalization: no Has patient had a PCN reaction occurring within the last 10 years: no If all of the above answers are "NO", then may proceed with Cephalosporin use.     Family History  Problem Relation Age of Onset   Hypertension Mother    Colon cancer Father    Cancer Sister        type unknown   Kidney disease Sister        dialysis   Colon cancer Cousin        x 2   CAD Neg Hx    Stroke Neg Hx    Diabetes Neg Hx    Stomach cancer Neg Hx    Esophageal cancer Neg Hx    Pancreatic cancer Neg Hx    Colon polyps Neg Hx     Prior to Admission medications   Medication Sig Start Date End Date Taking? Authorizing Provider   furosemide (LASIX) 20 MG tablet TAKE 1 TABLET(20 MG) BY MOUTH DAILY Patient taking differently: Take 20 mg by mouth daily. 11/11/22  Yes Newlin, Charlane Ferretti, MD  lactulose (CHRONULAC) 10 GM/15ML solution TAKE 30 ML BY MOUTH TWICE DAILY Patient taking differently: Take 20 g by mouth 2 (two) times daily. 10/28/22  Yes Charlott Rakes, MD  naloxone (NARCAN) nasal spray 4 mg/0.1 mL Place 1 spray into the nose as needed (accidental overdose). 07/06/22  Yes [provider]  oxyCODONE-acetaminophen (PERCOCET) 10-325 MG tablet Take 1 tablet by mouth 4 (four) times daily as needed for pain. 08/26/22  Yes [provider]  pantoprazole (PROTONIX) 40 MG tablet TAKE 1 TABLET(40 MG) BY MOUTH DAILY Patient taking differently: Take 40 mg by mouth daily. 11/11/22  Yes Charlott Rakes, MD  potassium chloride SA (KLOR-CON M) 20 MEQ tablet Take 1  tablet (20 mEq total) by mouth daily. 10/27/22  Yes Charlott Rakes, MD  ferrous gluconate (FERGON) 324 MG tablet Take 1 tablet (324 mg total) by mouth 2 (two) times daily with a meal. Patient not taking: Reported on 11/17/2022 10/27/22   Charlott Rakes, MD  folic acid (FOLVITE) 1 MG tablet Take 1 tablet (1 mg total) by mouth daily. Patient not taking: Reported on 11/17/2022 10/02/22   Aline August, MD  magnesium chloride (SLOW-MAG) 64 MG TBEC SR tablet Take 2 tablets (128 mg total) by mouth daily. Patient not taking: Reported on 11/17/2022 10/08/22   Blue, Soijett A, PA-C  spironolactone (ALDACTONE) 100 MG tablet Take 1 tablet (100 mg total) by mouth daily. Patient not taking: Reported on 11/17/2022 11/05/21 09/29/22  Kathie Dike, MD  thiamine 100 MG tablet Take 1 tablet (100 mg total) by mouth daily. Patient not taking: Reported on 11/17/2022 11/06/21   Kathie Dike, MD    Physical Exam: Vitals:   11/17/22 0630 11/17/22 0715 11/17/22 0733 11/17/22 0800  BP: 98/62 102/61  110/66  Pulse: 85 81  88  Resp: (!) 22 (!) 21  17  Temp:   98.3 F (36.8 C)   TempSrc:    Oral   SpO2: 94% 96%  94%   Constitutional: NAD, calm, comfortable-overall thin and cachectic in appearance ENMT: Mucous membranes are moist. Posterior pharynx clear of any exudate or lesions.  No tenderness to palpation over sinuses. Neck: normal, supple, no masses, no thyromegaly Respiratory: clear to auscultation bilaterally, no wheezing, no crackles. Normal respiratory effort. No accessory muscle use.  Cardiovascular: Regular rate and rhythm, no murmurs / rubs / gallops marked edema involving the right foot 3-4+, left foot 2+.  2+ pedal pulses. No carotid bruits.  Abdomen: no tenderness, no masses palpated. No hepatosplenomegaly. Bowel sounds positive.  Musculoskeletal: no clubbing / cyanosis. No joint deformity upper and lower extremities. Good ROM, no contractures. Normal muscle tone.  Skin: no rashes, lesions, ulcers. No induration-patient does have mild erythematous changes involving the right foot and just above the right ankle with marked report of pain with minimal palpation Neurologic: CN 2-12 grossly intact. Sensation intact, DTR normal. Strength 5/5 x all 4 extremities.  Psychiatric: Normal judgment and insight. Alert and oriented x 3. Normal mood.   Data Reviewed:  Sodium 127, potassium 3.6, CO2 21, glucose 109, BUN less than 5, creatinine 0.7, phosphorus 2.6, magnesium 1.5, alkaline phosphatase 118, albumin 1.7, LFTs are normal except for elevated total bilirubin 3.7 which is patient's baseline, WBCs are normal, hemoglobin 7.6, hematocrit 23.2, platelets 266,000, coags slightly elevated with PT 22.7 and INR 2.0, PTT 45.  Peritoneal fluid cytology essentially unremarkable with LD fluid less than 25 and hazy appearance.  Pathology review pending.  Assessment and Plan: Acute abdominal pain in the setting of ascites Pain essentially gone after decompressive paracentesis EDP documented that gastroenterologist/Dr. Loletha Carrow reviewed the cytology and felt that cytology not consistent with  SBP Continue to follow  Alcoholic cirrhosis with associated hyponatremia and hypoalbuminemia/history of alcoholic hepatitis Total bilirubin at baseline Platelets are normal with mild elevation in coags Sodium at baseline Mild hypomagnesemia-follow and replace as necessary Apparently no longer drinks but as a precaution we will institute CIWA protocol During previous admission left AMA  Right foot pain with marked pedal edema and mild erythema Uncertain if secondary to gout or evolving cellulitis. Stasis dermatitis can be considered but this typically does not involve the foot Elevate feet Check uric acid to rule out gout  If elevation of the feet does not improve edema and redness or if worsens consider beginning empiric antibiotic therapy  Hypertension/history of alcoholic cardiomyopathy with chronic recovered EF Current blood pressure readings in the 90s Last echocardiogram in 2023 with mildly hyperdynamic EF 60 to 65%  Protein calorie malnutrition Due ETOH and cirrhosis Nutrition consultation     Advance Care Planning:   Code Status: Full Code   Consults: GI  Family Communication: Patient only  Severity of Illness: The appropriate patient status for this patient is INPATIENT. Inpatient status is judged to be reasonable and necessary in order to provide the required intensity of service to ensure the patient's safety. The patient's presenting symptoms, physical exam findings, and initial radiographic and laboratory data in the context of their chronic comorbidities is felt to place them at high risk for further clinical deterioration. Furthermore, it is not anticipated that the patient will be medically stable for discharge from the hospital within 2 midnights of admission.   * I certify that at the point of admission it is my clinical judgment that the patient will require inpatient hospital care spanning beyond 2 midnights from the point of admission due to high intensity of  service, high risk for further deterioration and high frequency of surveillance required.*  Author: Erin Hearing, NP 11/17/2022 11:56 AM  For on call review [email or web address].

## 2022-11-17 NOTE — Progress Notes (Signed)
New Admission Note:   Arrival Method: stretcher  Mental Orientation: Alert and oriented  Telemetry: none  Assessment: Completed Skin: Dry  IV: right forearm  Pain: 3/10  Tubes: none  Safety Measures: Safety Fall Prevention Plan has been given, discussed and signed Admission: Completed [facility] [place] Orientation: Patient has been orientated to the room, unit and staff.  Family: none   Orders have been reviewed and implemented. Will continue to monitor the patient. Call light has been placed within reach and bed alarm has been activated.   Shalaina Guardiola RN [facility] Phone: [phone]

## 2022-11-18 DIAGNOSIS — E876 Hypokalemia: Secondary | ICD-10-CM

## 2022-11-18 DIAGNOSIS — K7200 Acute and subacute hepatic failure without coma: Secondary | ICD-10-CM

## 2022-11-18 DIAGNOSIS — K920 Hematemesis: Secondary | ICD-10-CM

## 2022-11-18 DIAGNOSIS — K7031 Alcoholic cirrhosis of liver with ascites: Secondary | ICD-10-CM | POA: Diagnosis not present

## 2022-11-18 LAB — COMPREHENSIVE METABOLIC PANEL
ALT: 13 U/L (ref 0–44)
AST: 35 U/L (ref 15–41)
Albumin: 2.8 g/dL — ABNORMAL LOW (ref 3.5–5.0)
Alkaline Phosphatase: 103 U/L (ref 38–126)
Anion gap: 10 (ref 5–15)
BUN: <5 mg/dL — ABNORMAL LOW (ref 6–20)
CO2: 24 mmol/L (ref 22–32)
Calcium: 8.5 mg/dL — ABNORMAL LOW (ref 8.9–10.3)
Chloride: 97 mmol/L — ABNORMAL LOW (ref 98–111)
Creatinine, Ser: 0.59 mg/dL — ABNORMAL LOW (ref 0.61–1.24)
GFR, Estimated: >60 mL/min (ref >60)
Glucose, Bld: 100 mg/dL — ABNORMAL HIGH (ref 70–99)
Potassium: 2.8 mmol/L — ABNORMAL LOW (ref 3.5–5.1)
Sodium: 131 mmol/L — ABNORMAL LOW (ref 135–145)
Total Bilirubin: 3.8 mg/dL — ABNORMAL HIGH (ref 0.3–1.2)
Total Protein: 6 g/dL — ABNORMAL LOW (ref 6.5–8.1)

## 2022-11-18 LAB — CBC
HCT: 22.6 % — ABNORMAL LOW (ref 39.0–52.0)
Hemoglobin: 7.7 g/dL — ABNORMAL LOW (ref 13.0–17.0)
MCH: 26.3 pg (ref 26.0–34.0)
MCHC: 34.1 g/dL (ref 30.0–36.0)
MCV: 77.1 fL — ABNORMAL LOW (ref 80.0–100.0)
Platelets: 159 10*3/uL (ref 150–400)
RBC: 2.93 MIL/uL — ABNORMAL LOW (ref 4.22–5.81)
RDW: 19.9 % — ABNORMAL HIGH (ref 11.5–15.5)
WBC: 5.5 10*3/uL (ref 4.0–10.5)
nRBC: 0 % (ref 0.0–0.2)

## 2022-11-18 LAB — MAGNESIUM: Magnesium: 2.2 mg/dL (ref 1.7–2.4)

## 2022-11-18 LAB — PROTIME-INR
INR: 2.3 — ABNORMAL HIGH (ref 0.8–1.2)
Prothrombin Time: 25 seconds — ABNORMAL HIGH (ref 11.4–15.2)

## 2022-11-18 LAB — OCCULT BLOOD X 1 CARD TO LAB, STOOL: Fecal Occult Bld: NEGATIVE

## 2022-11-18 LAB — HIV ANTIBODY (ROUTINE TESTING W REFLEX): HIV Screen 4th Generation wRfx: NONREACTIVE

## 2022-11-18 LAB — PATHOLOGIST SMEAR REVIEW

## 2022-11-18 LAB — HEMOGLOBIN AND HEMATOCRIT, BLOOD
HCT: 22.8 % — ABNORMAL LOW (ref 39.0–52.0)
Hemoglobin: 7.8 g/dL — ABNORMAL LOW (ref 13.0–17.0)

## 2022-11-18 MED ORDER — SPIRONOLACTONE 25 MG PO TABS: 25.0000 mg | ORAL_TABLET | Freq: Every evening | ORAL | Status: DC

## 2022-11-18 MED ORDER — POTASSIUM CHLORIDE 10 MEQ/100ML IV SOLN
10.0000 meq | INTRAVENOUS | Status: AC
  Administered 2022-11-18 (×4): 10 meq via INTRAVENOUS
  Filled 2022-11-18 (×4): qty 100

## 2022-11-18 MED ORDER — SODIUM CHLORIDE 0.9 % IV SOLN
50.0000 ug/h | INTRAVENOUS | Status: DC
  Administered 2022-11-18 – 2022-11-20 (×6): 50 ug/h via INTRAVENOUS
  Filled 2022-11-18 (×7): qty 1

## 2022-11-18 MED ORDER — PANTOPRAZOLE SODIUM 40 MG IV SOLR: 40.0000 mg | Freq: Two times a day (BID) | INTRAVENOUS | Status: DC

## 2022-11-18 MED ORDER — METOCLOPRAMIDE HCL 5 MG/ML IJ SOLN
5.0000 mg | Freq: Once | INTRAMUSCULAR | Status: AC
  Administered 2022-11-18: 5 mg via INTRAVENOUS
  Filled 2022-11-18: qty 2

## 2022-11-18 MED ORDER — SODIUM CHLORIDE 0.9 % IV SOLN
2.0000 g | INTRAVENOUS | Status: DC
  Administered 2022-11-18 – 2022-11-20 (×2): 2 g via INTRAVENOUS
  Filled 2022-11-18 (×2): qty 20

## 2022-11-18 MED ORDER — PANTOPRAZOLE SODIUM 40 MG PO TBEC
40.0000 mg | DELAYED_RELEASE_TABLET | Freq: Every day | ORAL | Status: DC
  Administered 2022-11-18: 40 mg via ORAL
  Filled 2022-11-18: qty 1

## 2022-11-18 MED ORDER — POTASSIUM CHLORIDE CRYS ER 20 MEQ PO TBCR
40.0000 meq | EXTENDED_RELEASE_TABLET | ORAL | Status: DC
  Administered 2022-11-18 (×2): 40 meq via ORAL
  Filled 2022-11-18 (×2): qty 2

## 2022-11-18 MED ORDER — OCTREOTIDE LOAD VIA INFUSION
50.0000 ug | Freq: Once | INTRAVENOUS | Status: AC
  Administered 2022-11-18: 50 ug via INTRAVENOUS
  Filled 2022-11-18: qty 25

## 2022-11-18 MED ORDER — VITAMIN K1 10 MG/ML IJ SOLN
10.0000 mg | Freq: Once | INTRAVENOUS | Status: AC
  Administered 2022-11-18: 10 mg via INTRAVENOUS
  Filled 2022-11-18: qty 1

## 2022-11-18 MED ORDER — FUROSEMIDE 40 MG PO TABS: 40.0000 mg | ORAL_TABLET | Freq: Every day | ORAL | Status: DC

## 2022-11-18 MED ORDER — PANTOPRAZOLE INFUSION (NEW) - SIMPLE MED
8.0000 mg/h | INTRAVENOUS | Status: DC
  Administered 2022-11-18 – 2022-11-19 (×2): 8 mg/h via INTRAVENOUS
  Filled 2022-11-18 (×3): qty 100

## 2022-11-18 MED ORDER — PANTOPRAZOLE 80MG IVPB - SIMPLE MED
80.0000 mg | Freq: Once | INTRAVENOUS | Status: AC
  Administered 2022-11-18: 80 mg via INTRAVENOUS
  Filled 2022-11-18: qty 100

## 2022-11-18 NOTE — TOC CAGE-AID Note (Signed)
Transition of Care [facility]) - CAGE-AID Screening   Patient Details  Name: Richard [name] MRN: [id number] Date of Birth: 1966-01-20  [facility]) CM/SW Contact:    Milinda Antis, LCSWA Phone Number: 11/18/2022, 1:29 PM   Clinical Narrative: LCSW received substance use consult and met with patient at bedside.  When LCSW arrived, patient was in bed with his head covered with blankets.  LCSW attempted to engage with the patient to provide substance use education, but the patient would not engage.  LCSW informed that patient that substance use resources would be left a bedside.  The patient replied "ok".  TOC following.    CAGE-AID Screening:

## 2022-11-18 NOTE — Progress Notes (Addendum)
[facility] Progress Note  CC:  Abdominal pain, alcohol associated cirrhosis   Subjective: Patient complaining of having heartburn today. He became acutely nauseous while I was at the bedside then vomited a moderate amount of light red then bright red blood. Generalized abdominal discomfort, no worse than yesterday. No CP or SOB. Vital signs are stable.    Objective:  Vital signs in last 24 hours: Temp:  [98 F (36.7 C)-98.6 F (37 C)] 98.2 F (36.8 C) (02/16 0831) Pulse Rate:  [77-93] 85 (02/16 0831) Resp:  [17-20] 20 (02/16 0831) BP: (102-110)/(57-70) 105/63 (02/16 0831) SpO2:  [95 %-100 %] 96 % (02/16 0831) Weight:  [52 kg] 52 kg (02/15 1224) Last BM Date : 11/17/22 General: Cachectic appearing 57 year old male, actively vomiting hematemesis. Heart: RRR, no murmur.  Pulm: Breath sounds with few bibasilar crackles otherwise clear.  Abdomen: Protuberant, mild gaseous distension, small amount of ascites. Generalized tenderness throughout without rebound or guarding.  Extremities:  Without edema. Neurologic:  Alert and  oriented x 4. Speech is clear. Moves all extremities. No asterixis.  Psych:  Alert and cooperative. Normal mood and affect.  Intake/Output from previous day: 02/15 0701 - 02/16 0700 In: 867.6 [P.O.:477; IV Piggyback:390.6] Out: 100 [Urine:100] Intake/Output this shift: Total I/O In: 300 [P.O.:300] Out: -   Lab Results: Recent Labs    11/16/22 1642 11/17/22 0520 11/18/22 0221  WBC 7.0 5.7 5.5  HGB 8.6* 7.6* 7.7*  HCT 25.4* 23.2* 22.6*  PLT 197 166 159   BMET Recent Labs    11/16/22 1642 11/17/22 0340 11/18/22 0221  NA 126* 127* 131*  K 2.6* 3.7 2.8*  CL 91* 96* 97*  CO2 23 21* 24  GLUCOSE 105* 109* 100*  BUN <5* <5* <5*  CREATININE 0.75 0.70 0.59*  CALCIUM 8.4* 7.7* 8.5*   LFT Recent Labs    11/18/22 0221  PROT 6.0*  ALBUMIN 2.8*  AST 35  ALT 13  ALKPHOS 103  BILITOT 3.8*   PT/INR Recent Labs     11/16/22 1642  LABPROT 22.7*  INR 2.0*   Hepatitis Panel No results for input(s): "HEPBSAG", "HCVAB", "HEPAIGM", "HEPBIGM" in the last 72 hours.  No results found.  Assessment / Plan:  57 year old male with a history of alcohol associated hepatitis, cirrhosis with EV and hepatic encephalopathy and alcohol associated pancreatitis presenting with decompensated cirrhosis with ascites. S/P paracentesis, removed 1.6 L of fluid, no SBP.  Normal renal function.  MELD 20/MELD NA 27 per labs 11/16/2022.  Stable LFTs.  Ammonia level 53.  No overt encephalopathy. He developed heartburn today then demonstrated active hematemesis around noon. Hemodynamically stable.  -NPO -Reglan 54m IV x 1 now -PPI  832mIV bolus -> 8/mg/hr infusion -Hold Furosemide and Spironolactone secondary to UGI bleeding -Stat H/H, INR -Restart Rocephin 2gm IV -Octreotide bolus and infusion  -EGD, timing to be determined  -Lactulose 20 g p.o. twice daily -Continue folate and thiamine daily -Await further recommendations per Dr. CuCandis Schatz  Acute on chronic anemia. Hg 8.6 -> 7.6 -> today 7.7.    Coagulopathy secondary to cirrhosis   Hypokalemia -KCl replacement per the hospitalist   Hyponatremia, secondary to cirrhosis and diuretics       LOS: 1 day   CoNoralyn Pick2/16/2024, 12:33 PM    [facility] Attending   I have also seen and evaluated this patient  1) hematemesis - none since this AM ? GERD from ascites though varices  possible as is portal gastropathy, gastritis - EGD tomorrow.  2) Worsening INR - suggesting worsening liver and failure though could be nutritional component - IV vit K and recheck tomorrow  3) he seems alert and not encephalopathic so may not need lactulose for long (elevated NH3 does not always indicate encephalopathy)  I think he needs updated imaging so will order liver doppler US also  Gatha Mayer, MD, [facility] See Shea Evans on call -  gastroenterology for best contact person 11/18/2022 3:46 PM

## 2022-11-18 NOTE — Progress Notes (Signed)
Initial Nutrition Assessment  DOCUMENTATION CODES:   Underweight  INTERVENTION:  - Monitor diet adv   NUTRITION DIAGNOSIS:   Inadequate oral intake related to inability to eat as evidenced by NPO status.  GOAL:   Patient will meet greater than or equal to 90% of their needs  MONITOR:   PO intake, Diet advancement  REASON FOR ASSESSMENT:   Consult Assessment of nutrition requirement/status  ASSESSMENT:   57 y.o. male admits related to abdominal pain. PMH includes: alcoholic cirrhosis, dilated cardiomyopathy, alcoholic related pancreatitis, ascites, colon cancer. Pt is currently receiving medical management related to abdominal pain in setting of liver cirrhosis and ascites.  Meds reviewed: folic acid, MVI, thiamine. Labs reviewed: Na low, K low.   Pt was sleeping at time of assessment with balnket over head. Pt would not wake to sound of voice. Pt is currently NPO so RD unable to add supplements at this time. Will attempt at f/u. No significant wt loss per record.   NUTRITION - FOCUSED PHYSICAL EXAM:  Attempt at follow up.   Diet Order:   Diet Order             Diet NPO time specified  Diet effective now                   EDUCATION NEEDS:   Not appropriate for education at this time  Skin:  Skin Assessment: Reviewed RN Assessment  Last BM:  2/16  Height:   Ht Readings from Last 1 Encounters:  11/17/22 6' (1.829 m)    Weight:   Wt Readings from Last 1 Encounters:  11/17/22 52 kg    Ideal Body Weight:     BMI:  Body mass index is 15.55 kg/m.  Estimated Nutritional Needs:   Kcal:  1560-1820 kcals  Protein:  80-90 gm  Fluid:  >/= 1.5 L  Thalia Bloodgood, RD, LDN, CNSC.

## 2022-11-18 NOTE — H&P (View-Only) (Signed)
Richard [name] Progress Note  CC:  Abdominal pain, alcohol associated cirrhosis   Subjective: Patient complaining of having heartburn today. He became acutely nauseous while I was at the bedside then vomited a moderate amount of light red then bright red blood. Generalized abdominal discomfort, no worse than yesterday. No CP or SOB. Vital signs are stable.    Objective:  Vital signs in last 24 hours: Temp:  [98 F (36.7 C)-98.6 F (37 C)] 98.2 F (36.8 C) (02/16 0831) Pulse Rate:  [77-93] 85 (02/16 0831) Resp:  [17-20] 20 (02/16 0831) BP: (102-110)/(57-70) 105/63 (02/16 0831) SpO2:  [95 %-100 %] 96 % (02/16 0831) Weight:  [52 kg] 52 kg (02/15 1224) Last BM Date : 11/17/22 General: Cachectic appearing 57 year old male, actively vomiting hematemesis. Heart: RRR, no murmur.  Pulm: Breath sounds with few bibasilar crackles otherwise clear.  Abdomen: Protuberant, mild gaseous distension, small amount of ascites. Generalized tenderness throughout without rebound or guarding.  Extremities:  Without edema. Neurologic:  Alert and  oriented x 4. Speech is clear. Moves all extremities. No asterixis.  Psych:  Alert and cooperative. Normal mood and affect.  Intake/Output from previous day: 02/15 0701 - 02/16 0700 In: 867.6 [P.O.:477; IV Piggyback:390.6] Out: 100 [Urine:100] Intake/Output this shift: Total I/O In: 300 [P.O.:300] Out: -   Lab Results: Recent Labs    11/16/22 1642 11/17/22 0520 11/18/22 0221  WBC 7.0 5.7 5.5  HGB 8.6* 7.6* 7.7*  HCT 25.4* 23.2* 22.6*  PLT 197 166 159   BMET Recent Labs    11/16/22 1642 11/17/22 0340 11/18/22 0221  NA 126* 127* 131*  K 2.6* 3.7 2.8*  CL 91* 96* 97*  CO2 23 21* 24  GLUCOSE 105* 109* 100*  BUN <5* <5* <5*  CREATININE 0.75 0.70 0.59*  CALCIUM 8.4* 7.7* 8.5*   LFT Recent Labs    11/18/22 0221  PROT 6.0*  ALBUMIN 2.8*  AST 35  ALT 13  ALKPHOS 103  BILITOT 3.8*   PT/INR Recent Labs     11/16/22 1642  LABPROT 22.7*  INR 2.0*   Hepatitis Panel No results for input(s): "HEPBSAG", "HCVAB", "HEPAIGM", "HEPBIGM" in the last 72 hours.  No results found.  Assessment / Plan:  57 year old male with a history of alcohol associated hepatitis, cirrhosis with EV and hepatic encephalopathy and alcohol associated pancreatitis presenting with decompensated cirrhosis with ascites. S/P paracentesis, removed 1.6 L of fluid, no SBP.  Normal renal function.  MELD 20/MELD NA 27 per labs 11/16/2022.  Stable LFTs.  Ammonia level 53.  No overt encephalopathy. He developed heartburn today then demonstrated active hematemesis around noon. Hemodynamically stable.  -NPO -Reglan 86m IV x 1 now -PPI  836mIV bolus -> 8/mg/hr infusion -Hold Furosemide and Spironolactone secondary to UGI bleeding -Stat H/H, INR -Restart Rocephin 2gm IV -Octreotide bolus and infusion  -EGD, timing to be determined  -Lactulose 20 g p.o. twice daily -Continue folate and thiamine daily -Await further recommendations per Dr. CuCandis Schatz  Acute on chronic anemia. Hg 8.6 -> 7.6 -> today 7.7.    Coagulopathy secondary to cirrhosis   Hypokalemia -KCl replacement per the hospitalist   Hyponatremia, secondary to cirrhosis and diuretics       LOS: 1 day   CoNoralyn Pick2/16/2024, 12:33 PM    [facility] Attending   I have also seen and evaluated this patient  1) hematemesis - none since this AM ? GERD from ascites though varices  possible as is portal gastropathy, gastritis - EGD tomorrow.  2) Worsening INR - suggesting worsening liver and failure though could be nutritional component - IV vit K and recheck tomorrow  3) he seems alert and not encephalopathic so may not need lactulose for long (elevated NH3 does not always indicate encephalopathy)  I think he needs updated imaging so will order liver doppler US also  Gatha Mayer, MD, [name] See Shea Evans on call -  [name] for best contact person 11/18/2022 3:46 PM

## 2022-11-18 NOTE — Progress Notes (Signed)
[facility] PROGRESS NOTE   Richard [name] [id number] DOB: October 26, 1965 DOA: 11/16/2022  PCP: Charlott Rakes, MD  Brief History/Interval Summary: 57 y.o. 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  He presented to the ER with complaints of abdominal distention abdominal pain and swelling of his lower extremities.  Went paracentesis.  Fluid analysis did not raise concern for SBP.  Patient was hospitalized for further management.  Gastroenterology was consulted  Consultants: Gastroenterology  Procedures: Paracentesis    Subjective/Interval History: Patient mentions that he feels better.  Still concerned about the swelling in his lower extremities.  Denies any abdominal pain nausea or vomiting.  Eating his breakfast this morning.    Assessment/Plan:  Abdominal pain in the setting of liver cirrhosis and ascites Patient underwent paracentesis.  No SBP noted on fluid analysis.  Symptoms resolved after paracentesis.  Tolerating his diet.  Continue to monitor for now.  Alcoholic liver cirrhosis with associated hyponatremia and hypoalbuminemia/history of alcoholic hepatitis Gastroenterology consulted.  LFTs are stable.  Sodium level is stable.  Ammonia level was noted to be 53 and patient was started on lactulose which is being continued. Okay to stop antibiotics.  Will initiate diuretics from this afternoon.  Hypokalemia/hypomagnesemia Magnesium corrected to 2.2.  Potassium level noted to be low at 2.8 today.  We will aggressively supplement.  Recheck labs tomorrow.  Lower extremity edema right greater than left He has pitting edema bilateral lower extremities.  This is likely due to liver cirrhosis and  hypoalbuminemia.  No concern for DVT at this time.  Compression stockings will be ordered.  Diuretics may help. Uric acid level noted to be normal at 4.7.  No concern for cellulitis at this time.  Essential hypertension Monitor blood pressures closely.  He has borderline low blood pressures which is likely due to his liver cirrhosis.  Avoid blood pressure lowering agents.  Anemia likely of chronic disease Hemoglobin is stable.  No evidence of overt bleeding.  DVT Prophylaxis: Patient is auto anticoagulated.  With a INR of 2. Code Status: Full Code Family Communication: Discussed with patient Disposition Plan: Start mobilizing.  Anticipate discharge tomorrow.  Status is: Inpatient Remains inpatient appropriate because: Hypokalemia      Medications: Scheduled:  folic acid  1 mg Oral Daily   lactulose  20 g Oral BID   lidocaine  1 patch Transdermal Q24H   lidocaine  1 patch Transdermal Once   multivitamin with minerals  1 tablet Oral Daily   potassium chloride  40 mEq Oral Q4H   sodium chloride flush  3 mL Intravenous Q12H   thiamine  100 mg Oral Daily   Or   thiamine  100 mg Intravenous Daily   Continuous:  cefTRIAXone (ROCEPHIN)  IV 2 g (11/17/22 2206)   [id number] **OR** LORazepam  Antibiotics: Anti-infectives (From admission, onward)    Start     Dose/Rate Route Frequency Ordered Stop   11/17/22 2200  cefTRIAXone (ROCEPHIN) 2 g in sodium chloride 0.9 % 100 mL IVPB        2 g 200 mL/hr over 30 Minutes Intravenous Every 24 hours 11/17/22 0315     11/16/22 2145  cefTRIAXone (ROCEPHIN) 2 g in sodium chloride 0.9 % 100 mL IVPB        2 g 200 mL/hr over 30  Minutes Intravenous  Once 11/16/22 2140 11/16/22 2235       Objective:  Vital Signs  Vitals:   11/17/22 1617 11/17/22 2114 11/18/22 0528 11/18/22 0831  BP: (!) 104/59 102/60 103/70 105/63  Pulse: 77 93 82 85  Resp: 17 18 17 20  $ Temp: 98.1 F (36.7 C) 98.6 F (37 C) 98.2 F (36.8 C) 98.2 F (36.8 C)   TempSrc: Oral Oral Oral Oral  SpO2: 100% 95% 100% 96%  Weight:      Height:        Intake/Output Summary (Last 24 hours) at 11/18/2022 0902 Last data filed at 11/18/2022 0800 Gross per 24 hour  Intake 1167.63 ml  Output 100 ml  Net 1067.63 ml   Filed Weights   11/17/22 1224  Weight: 52 kg    General appearance: Awake alert.  In no distress Resp: Clear to auscultation bilaterally.  Normal effort Cardio: S1-S2 is normal regular.  No S3-S4.  No rubs murmurs or bruit GI: Abdomen is soft.  Mildly distended nontender.  Bowel sounds present.  No masses organomegaly. Extremities: Pitting edema bilateral lower extremities localized mainly to the feet Neurologic: Alert and oriented x3.  No focal neurological deficits.    Lab Results:  Data Reviewed: I have personally reviewed following labs and reports of the imaging studies  CBC: Recent Labs  Lab 11/16/22 1642 11/17/22 0520 11/18/22 0221  WBC 7.0 5.7 5.5  NEUTROABS 4.6  --   --   HGB 8.6* 7.6* 7.7*  HCT 25.4* 23.2* 22.6*  MCV 78.4* 79.5* 77.1*  PLT 197 166 [id number]    Basic Metabolic Panel: Recent Labs  Lab 11/16/22 1642 11/17/22 0340 11/18/22 0221  NA 126* 127* 131*  K 2.6* 3.7 2.8*  CL 91* 96* 97*  CO2 23 21* 24  GLUCOSE 105* 109* 100*  BUN <5* <5* <5*  CREATININE 0.75 0.70 0.59*  CALCIUM 8.4* 7.7* 8.5*  MG  --  1.5* 2.2  PHOS  --  2.6  --     GFR: Estimated Creatinine Clearance: 77.6 mL/min (A) (by C-G formula based on SCr of 0.59 mg/dL (L)).  Liver Function Tests: Recent Labs  Lab 11/16/22 1642 11/17/22 0340 11/18/22 0221  AST 49* 39 35  ALT 15 10 13  $ ALKPHOS 144* 118 103  BILITOT 4.8* 3.7* 3.8*  PROT 6.7 5.3* 6.0*  ALBUMIN 2.1* 1.7* 2.8*    Recent Labs  Lab 11/16/22 1642  LIPASE 30   Recent Labs  Lab 11/16/22 1642  AMMONIA 53*    Coagulation Profile: Recent Labs  Lab 11/16/22 1642  INR 2.0*     Recent Results (from the past 240 hour(s))  Body fluid culture w Gram Stain      Status: None (Preliminary result)   Collection Time: 11/16/22  9:35 PM   Specimen: Peritoneal Cavity; Peritoneal Fluid  Result Value Ref Range Status   Specimen Description PERITONEAL CAVITY  Final   Special Requests NONE  Final   Gram Stain   Final    FEW WBC PRESENT, PREDOMINANTLY MONONUCLEAR NO ORGANISMS SEEN    Culture   Final    NO GROWTH < 12 HOURS Performed at [facility], [place] [address] [place], [place] [address]    Report Status PENDING  Incomplete      Radiology Studies: No results found.     LOS: 1 day   Jermain Curt [employer] on [email or web address]  11/18/2022, 9:02 AM

## 2022-11-18 NOTE — Progress Notes (Signed)
   11/18/22 1100  Spiritual Encounters  Type of Visit Initial  Care provided to: Patient  Referral source Patient request  Reason for visit Routine spiritual support  OnCall Visit No  Spiritual Framework  Presenting Themes Goals in life/care  Community/Connection Family  Patient Stress Factors Health changes  Interventions  Spiritual Care Interventions Made Compassionate presence;Reflective listening   Ch responded to request for emotional and spiritual care. Family was present at bedside. Pt seems to be in good mood. Ch encouraged pt. No follow-up needed at this time.

## 2022-11-19 ENCOUNTER — Inpatient Hospital Stay (HOSPITAL_COMMUNITY): Payer: Medicaid Other | Admitting: Certified Registered Nurse Anesthetist

## 2022-11-19 ENCOUNTER — Encounter (HOSPITAL_COMMUNITY): Payer: Self-pay | Admitting: Internal Medicine

## 2022-11-19 ENCOUNTER — Encounter (HOSPITAL_COMMUNITY)
Admission: EM | Disposition: A | Discharge status: Home / Self Care | Payer: Self-pay | Source: Home / Self Care | Attending: Internal Medicine

## 2022-11-19 ENCOUNTER — Inpatient Hospital Stay (HOSPITAL_COMMUNITY): Payer: Medicaid Other

## 2022-11-19 DIAGNOSIS — K2960 Other gastritis without bleeding: Secondary | ICD-10-CM

## 2022-11-19 DIAGNOSIS — I252 Old myocardial infarction: Secondary | ICD-10-CM

## 2022-11-19 DIAGNOSIS — I110 Hypertensive heart disease with heart failure: Secondary | ICD-10-CM

## 2022-11-19 DIAGNOSIS — K7031 Alcoholic cirrhosis of liver with ascites: Secondary | ICD-10-CM | POA: Diagnosis not present

## 2022-11-19 DIAGNOSIS — K920 Hematemesis: Secondary | ICD-10-CM | POA: Diagnosis not present

## 2022-11-19 DIAGNOSIS — I8511 Secondary esophageal varices with bleeding: Secondary | ICD-10-CM

## 2022-11-19 DIAGNOSIS — K2961 Other gastritis with bleeding: Secondary | ICD-10-CM

## 2022-11-19 DIAGNOSIS — E876 Hypokalemia: Secondary | ICD-10-CM | POA: Diagnosis not present

## 2022-11-19 DIAGNOSIS — F17210 Nicotine dependence, cigarettes, uncomplicated: Secondary | ICD-10-CM

## 2022-11-19 DIAGNOSIS — I509 Heart failure, unspecified: Secondary | ICD-10-CM

## 2022-11-19 DIAGNOSIS — I8501 Esophageal varices with bleeding: Secondary | ICD-10-CM

## 2022-11-19 HISTORY — PX: BIOPSY: SHX5522

## 2022-11-19 HISTORY — PX: ESOPHAGOGASTRODUODENOSCOPY (EGD) WITH PROPOFOL: SHX5813

## 2022-11-19 HISTORY — PX: ESOPHAGEAL BANDING: SHX5518

## 2022-11-19 LAB — CBC
HCT: 22.4 % — ABNORMAL LOW (ref 39.0–52.0)
Hemoglobin: 7.5 g/dL — ABNORMAL LOW (ref 13.0–17.0)
MCH: 26.8 pg (ref 26.0–34.0)
MCHC: 33.5 g/dL (ref 30.0–36.0)
MCV: 80 fL (ref 80.0–100.0)
Platelets: 165 10*3/uL (ref 150–400)
RBC: 2.8 MIL/uL — ABNORMAL LOW (ref 4.22–5.81)
RDW: 20.6 % — ABNORMAL HIGH (ref 11.5–15.5)
WBC: 4.5 10*3/uL (ref 4.0–10.5)
nRBC: 0 % (ref 0.0–0.2)

## 2022-11-19 LAB — MAGNESIUM: Magnesium: 1.8 mg/dL (ref 1.7–2.4)

## 2022-11-19 LAB — PREPARE RBC (CROSSMATCH)

## 2022-11-19 LAB — BASIC METABOLIC PANEL
Anion gap: 7 (ref 5–15)
BUN: <5 mg/dL — ABNORMAL LOW (ref 6–20)
CO2: 21 mmol/L — ABNORMAL LOW (ref 22–32)
Calcium: 8.2 mg/dL — ABNORMAL LOW (ref 8.9–10.3)
Chloride: 102 mmol/L (ref 98–111)
Creatinine, Ser: 0.64 mg/dL (ref 0.61–1.24)
GFR, Estimated: >60 mL/min (ref >60)
Glucose, Bld: 141 mg/dL — ABNORMAL HIGH (ref 70–99)
Potassium: 3.7 mmol/L (ref 3.5–5.1)
Sodium: 130 mmol/L — ABNORMAL LOW (ref 135–145)

## 2022-11-19 LAB — PROTIME-INR
INR: 2.2 — ABNORMAL HIGH (ref 0.8–1.2)
Prothrombin Time: 23.8 seconds — ABNORMAL HIGH (ref 11.4–15.2)

## 2022-11-19 SURGERY — ESOPHAGOGASTRODUODENOSCOPY (EGD) WITH PROPOFOL
Anesthesia: General

## 2022-11-19 MED ORDER — MAGNESIUM SULFATE 2 GM/50ML IV SOLN
2.0000 g | Freq: Once | INTRAVENOUS | Status: AC
  Administered 2022-11-19: 2 g via INTRAVENOUS
  Filled 2022-11-19 (×2): qty 50

## 2022-11-19 MED ORDER — SUCCINYLCHOLINE CHLORIDE 200 MG/10ML IV SOSY
PREFILLED_SYRINGE | INTRAVENOUS | Status: DC | PRN
  Administered 2022-11-19: 80 mg via INTRAVENOUS

## 2022-11-19 MED ORDER — DEXAMETHASONE SODIUM PHOSPHATE 10 MG/ML IJ SOLN
INTRAMUSCULAR | Status: DC | PRN
  Administered 2022-11-19: 5 mg via INTRAVENOUS

## 2022-11-19 MED ORDER — PROPOFOL 10 MG/ML IV BOLUS
INTRAVENOUS | Status: DC | PRN
  Administered 2022-11-19: 80 mg via INTRAVENOUS
  Administered 2022-11-19: 20 mg via INTRAVENOUS

## 2022-11-19 MED ORDER — ONDANSETRON HCL 4 MG/2ML IJ SOLN
INTRAMUSCULAR | Status: DC | PRN
  Administered 2022-11-19: 4 mg via INTRAVENOUS

## 2022-11-19 MED ORDER — LIDOCAINE 2% (20 MG/ML) 5 ML SYRINGE
INTRAMUSCULAR | Status: DC | PRN
  Administered 2022-11-19: 50 mg via INTRAVENOUS

## 2022-11-19 MED ORDER — PANTOPRAZOLE SODIUM 40 MG PO TBEC
40.0000 mg | DELAYED_RELEASE_TABLET | Freq: Two times a day (BID) | ORAL | Status: DC
  Administered 2022-11-19 – 2022-11-21 (×4): 40 mg via ORAL
  Filled 2022-11-19 (×4): qty 1

## 2022-11-19 MED ORDER — PHENYLEPHRINE 80 MCG/ML (10ML) SYRINGE FOR IV PUSH (FOR BLOOD PRESSURE SUPPORT)
PREFILLED_SYRINGE | INTRAVENOUS | Status: DC | PRN
  Administered 2022-11-19 (×3): 80 ug via INTRAVENOUS

## 2022-11-19 MED ORDER — LACTATED RINGERS IV SOLN
INTRAVENOUS | Status: AC | PRN
  Administered 2022-11-19: 1000 mL via INTRAVENOUS

## 2022-11-19 SURGICAL SUPPLY — 15 items

## 2022-11-19 NOTE — Op Note (Signed)
[facility] Patient Name: Richard [name] Procedure Date : 11/19/2022 MRN: [id number] Attending MD: Gladstone Pih. Candis [name] , MD, [id number] Date of Birth: 1966-05-01 CSN: [id number] Age: 57 Admit Type: Inpatient Procedure:                Upper GI endoscopy Indications:              Hematemesis Providers:                Nicki Reaper E. Candis Schatz, MD, Doristine Johns, RN,                            Brien Mates, Technician Referring MD:              Medicines:                General Anesthesia Complications:            No immediate complications. Estimated Blood Loss:     Estimated blood loss was minimal. Procedure:                Pre-Anesthesia Assessment:                           - Prior to the procedure, a History and Physical                            was performed, and patient medications and                            allergies were reviewed. The patient's tolerance of                            previous anesthesia was also reviewed. The risks                            and benefits of the procedure and the sedation                            options and risks were discussed with the patient.                            All questions were answered, and informed consent                            was obtained. Prior Anticoagulants: The patient has                            taken no anticoagulant or antiplatelet agents. ASA                            Grade Assessment: III - A patient with severe                            systemic disease. After reviewing the risks and  benefits, the patient was deemed in satisfactory                            condition to undergo the procedure.                           After obtaining informed consent, the endoscope was                            passed under direct vision. Throughout the                            procedure, the patient's blood pressure, pulse, and                            oxygen saturations  were monitored continuously. The                            [id number]) Olympus endoscope was introduced                            through the mouth, and advanced to the second part                            of duodenum. The upper GI endoscopy was                            accomplished without difficulty. The patient                            tolerated the procedure well. Scope In: Scope Out: Findings:      The examined portions of the nasopharynx, oropharynx and larynx were       normal.      Grade II, large (> 5 mm) varices were found in the middle third of the       esophagus and in the lower third of the esophagus. Five bands were       successfully placed with complete eradication, resulting in deflation of       varices. There was no bleeding at the end of the procedure.      The exam of the esophagus was otherwise normal.      Diffuse giant, friable gastric folds were found in the gastric body. The       stomach seemed rigid and poorly distendable. Retroflexed views into the       cardia were quite limited. Biopsies were taken with a cold forceps for       histology. Estimated blood loss was minimal.      The exam of the stomach was otherwise normal.      The examined duodenum was normal. Impression:               - The examined portions of the nasopharynx,                            oropharynx and larynx were normal.                           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                            Completely eradicated. Banded.                           - Enlarged, friable gastric folds with poor                            distensbility. Biopsied. ?infiltrative disorder?                           - Normal examined duodenum.                           - Although the patient's hematemesis was rather                            mild (no significant drop in hgb or blood pressure,                            BUN not elevated), there was no other  identifiable                            source for hematemesis, and his varices were quite                            prominent and inflamed. Moderate Sedation:      N/A Recommendation:           - Return patient to hospital ward for ongoing care.                           - Full liquid diet. Can advance to soft diet                            tomorrow if no evidence of ongoing bleeding                           - Continue present medications.                           - Await pathology results.                           - Continue octreotide gtt through tomorrow                           - Continue ceftriaxone through tomorrow                           - Ok to change PPI to PO BID                           - Patient should be discharged on nonselective beta  blocker (propranolol) given suspected variceal bleed                           - Recommend repeat EGD in 4 weeks for repeat banding Procedure Code(s):        --- Professional ---                           [phone], Esophagogastroduodenoscopy, flexible,                            transoral; with band ligation of esophageal/gastric                            varices                           43239, Esophagogastroduodenoscopy, flexible,                            transoral; with biopsy, single or multiple Diagnosis Code(s):        --- Professional ---                           I85.00, Esophageal varices without bleeding                           K29.60, Other gastritis without bleeding                           K92.0, Hematemesis CPT copyright 2022 American [employer] Association. All rights reserved. The codes documented in this report are preliminary and upon coder review may  be revised to meet current compliance requirements. Richard Laidlaw E. Candis Schatz, MD 11/19/2022 4:21:35 PM This report has been signed electronically. Number of Addenda: 0

## 2022-11-19 NOTE — Anesthesia Procedure Notes (Signed)
Procedure Name: Intubation Date/Time: 11/19/2022 3:30 PM  Performed by: Clearnce Sorrel, CRNAPre-anesthesia Checklist: Patient identified, Emergency Drugs available, Suction available and Patient being monitored Patient Re-evaluated:Patient Re-evaluated prior to induction Oxygen Delivery Method: Circle System Utilized Preoxygenation: Pre-oxygenation with 100% oxygen Induction Type: IV induction Ventilation: Mask ventilation without difficulty Laryngoscope Size: Mac and 4 Grade View: Grade II Tube type: Oral Tube size: 7.5 mm Number of attempts: 1 Airway Equipment and Method: Stylet and Oral airway Placement Confirmation: ETT inserted through vocal cords under direct vision, positive ETCO2 and breath sounds checked- equal and bilateral Secured at: 23 cm Tube secured with: Tape Dental Injury: Teeth and Oropharynx as per pre-operative assessment

## 2022-11-19 NOTE — Interval H&P Note (Signed)
History and Physical Interval Note:  11/19/2022 2:01 PM  Richard [name]  has presented today for surgery, with the diagnosis of hematemesis.  The various methods of treatment have been discussed with the patient and family. After consideration of risks, benefits and other options for treatment, the patient has consented to  Procedure(s): ESOPHAGOGASTRODUODENOSCOPY (EGD) WITH PROPOFOL (N/A) as a surgical intervention.  The patient's history has been reviewed, patient examined, no change in status, stable for surgery.  I have reviewed the patient's chart and labs.  Questions were answered to the patient's satisfaction.     Daryel November

## 2022-11-19 NOTE — Progress Notes (Signed)
[facility] PROGRESS NOTE   Richard [name] [id number] DOB: [birth year] DOA: 11/16/2022  PCP: Charlott Rakes, MD  Brief History/Interval Summary: 57 y.o. 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  He presented to the ER with complaints of abdominal distention abdominal pain and swelling of his lower extremities.  Went paracentesis.  Fluid analysis did not raise concern for SBP.  Patient was hospitalized for further management.  Gastroenterology was consulted  Consultants: Gastroenterology  Procedures: Paracentesis    Subjective/Interval History: Patient has not had any further episodes of nausea or vomiting.  Denies any abdominal pain.     Assessment/Plan:  Abdominal pain in the setting of liver cirrhosis and ascites Patient underwent paracentesis.  No SBP noted on fluid analysis.  Symptoms resolved after paracentesis.  Tolerating his diet.  Continue to monitor for now.  Hematemesis He had coffee-ground emesis yesterday.  No significant drop in hemoglobin noted.  Patient with history of liver cirrhosis.  Could have variceal bleeding.  GI is following.  Plan is for upper endoscopy today.  Patient remains on PPI infusion as well as octreotide infusion.  Remains n.p.o. for now.  Alcoholic liver cirrhosis with associated hyponatremia and hypoalbuminemia/history of alcoholic hepatitis/coagulopathy LFTs are stable.  Sodium level is stable.  Ammonia level was noted to be 53 and patient was started on lactulose which is being continued. Antibiotics were initially initiated and then discontinued and once he had his hematemesis this was reinitiated.  Remains on ceftriaxone.  Gastroenterology continues to follow.    Consider initiating diuretics depending on GI input. INR remains elevated.  Patient was given vitamin K.  Hypokalemia/hypomagnesemia Potassium improved to 3.7.  Magnesium 1.8.    Lower extremity edema right greater than left He has pitting edema bilateral lower extremities.  This is likely due to liver cirrhosis and hypoalbuminemia.  No concern for DVT at this time.  Compression stockings will be ordered.  Diuretics may help. Uric acid level noted to be normal at 4.7.  No concern for cellulitis at this time.  Essential hypertension Monitor blood pressures closely.  He has borderline low blood pressures which is likely due to his liver cirrhosis.  Avoid blood pressure lowering agents.  Anemia likely of chronic disease Hemoglobin is stable.  See above under hematemesis.  DVT Prophylaxis: Patient is auto anticoagulated.  With a INR of 2. Code Status: Full Code Family Communication: Discussed with patient Disposition Plan: Start mobilizing.  Anticipate discharge tomorrow.  Status is: Inpatient Remains inpatient appropriate because: Hypokalemia      Medications: Scheduled:  folic acid  1 mg Oral Daily   lactulose  20 g Oral BID   lidocaine  1 patch Transdermal Q24H   lidocaine  1 patch Transdermal Once   multivitamin with minerals  1 tablet Oral Daily   [START ON 11/22/2022] pantoprazole  40 mg Intravenous Q12H   sodium chloride flush  3 mL Intravenous Q12H   thiamine  100 mg Oral Daily   Or   thiamine  100 mg Intravenous Daily   Continuous:  cefTRIAXone (ROCEPHIN)  IV 2 g (11/18/22 1252)   octreotide (SANDOSTATIN) 500 mcg in sodium chloride 0.9 % 250 mL (2 mcg/mL) infusion 50 mcg/hr (11/19/22 0027)   pantoprazole 8 mg/hr (11/19/22 0028)   [id number] **OR** LORazepam  Antibiotics: Anti-infectives (From admission,  onward)    Start     Dose/Rate Route Frequency Ordered Stop   11/18/22 1315  cefTRIAXone (ROCEPHIN) 2 g in sodium chloride 0.9 % 100 mL IVPB        2  g 200 mL/hr over 30 Minutes Intravenous Every 24 hours 11/18/22 1225 11/23/22 1314   11/17/22 2200  cefTRIAXone (ROCEPHIN) 2 g in sodium chloride 0.9 % 100 mL IVPB  Status:  Discontinued        2 g 200 mL/hr over 30 Minutes Intravenous Every 24 hours 11/17/22 0315 11/18/22 0909   11/16/22 2145  cefTRIAXone (ROCEPHIN) 2 g in sodium chloride 0.9 % 100 mL IVPB        2 g 200 mL/hr over 30 Minutes Intravenous  Once 11/16/22 2140 11/16/22 2235       Objective:  Vital Signs  Vitals:   11/18/22 1213 11/18/22 1736 11/18/22 2123 11/19/22 0512  BP: 122/76 119/73 118/80 106/74  Pulse: 85 73 77 87  Resp: 20 17 18 18  $ Temp: 98.3 F (36.8 C) 98.2 F (36.8 C) 98.7 F (37.1 C) 97.6 F (36.4 C)  TempSrc: Oral  Oral Oral  SpO2: 100% 98% 98% 96%  Weight:      Height:        Intake/Output Summary (Last 24 hours) at 11/19/2022 0842 Last data filed at 11/19/2022 [id number] Gross per 24 hour  Intake 278.86 ml  Output 200 ml  Net 78.86 ml    Filed Weights   11/17/22 1224  Weight: 52 kg    General appearance: Awake alert.  In no distress Resp: Clear to auscultation bilaterally.  Normal effort Cardio: S1-S2 is normal regular.  No S3-S4.  No rubs murmurs or bruit GI: Abdomen is soft.  Mildly distended.  Nontender. Extremities: Mild bilateral lower extremity though it appears to be better compared to yesterday. Neurologic: Alert and oriented x3.  No focal neurological deficits.     Lab Results:  Data Reviewed: I have personally reviewed following labs and reports of the imaging studies  CBC: Recent Labs  Lab 11/16/22 1642 11/17/22 0520 11/18/22 0221 11/18/22 1336 11/19/22 0201  WBC 7.0 5.7 5.5  --  4.5  NEUTROABS 4.6  --   --   --   --   HGB 8.6* 7.6* 7.7* 7.8* 7.5*  HCT 25.4* 23.2* 22.6* 22.8* 22.4*  MCV 78.4* 79.5* 77.1*  --  80.0  PLT 197 166 159  --  165     Basic Metabolic Panel: Recent Labs  Lab 11/16/22 1642 11/17/22 0340 11/18/22 0221 11/19/22 0201  NA 126* 127*  131* 130*  K 2.6* 3.7 2.8* 3.7  CL 91* 96* 97* 102  CO2 23 21* 24 21*  GLUCOSE 105* 109* 100* 141*  BUN <5* <5* <5* <5*  CREATININE 0.75 0.70 0.59* 0.64  CALCIUM 8.4* 7.7* 8.5* 8.2*  MG  --  1.5* 2.2 1.8  PHOS  --  2.6  --   --      GFR: Estimated Creatinine Clearance: 77.6 mL/min (by C-G formula based on SCr of 0.64 mg/dL).  Liver Function Tests: Recent Labs  Lab 11/16/22 1642 11/17/22 0340 11/18/22 0221  AST 49* 39 35  ALT 15 10 13  $ ALKPHOS 144* 118 103  BILITOT 4.8* 3.7* 3.8*  PROT 6.7 5.3* 6.0*  ALBUMIN 2.1* 1.7* 2.8*     Recent Labs  Lab 11/16/22 1642  LIPASE 30    Recent Labs  Lab 11/16/22 1642  AMMONIA 53*  Coagulation Profile: Recent Labs  Lab 11/16/22 1642 11/18/22 1336 11/19/22 0201  INR 2.0* 2.3* 2.2*      Recent Results (from the past 240 hour(s))  Body fluid culture w Gram Stain     Status: None (Preliminary result)   Collection Time: 11/16/22  9:35 PM   Specimen: Peritoneal Cavity; Peritoneal Fluid  Result Value Ref Range Status   Specimen Description PERITONEAL CAVITY  Final   Special Requests NONE  Final   Gram Stain   Final    FEW WBC PRESENT, PREDOMINANTLY MONONUCLEAR NO ORGANISMS SEEN    Culture   Final    NO GROWTH 2 DAYS Performed at [facility], [place] [address] [place], [place] [address]    Report Status PENDING  Incomplete      Radiology Studies: No results found.     LOS: 2 days   [facility] Pager on [email or web address]  11/19/2022, 8:42 AM

## 2022-11-19 NOTE — Transfer of Care (Signed)
Immediate Anesthesia Transfer of Care Note  Patient: Richard [name]  Procedure(s) Performed: ESOPHAGOGASTRODUODENOSCOPY (EGD) WITH PROPOFOL BIOPSY  Patient Location: PACU  Anesthesia Type:General  Level of Consciousness: drowsy and patient cooperative  Airway & Oxygen Therapy: Patient Spontanous Breathing  Post-op Assessment: Report given to RN and Post -op Vital signs reviewed and stable  Post vital signs: Reviewed and stable  Last Vitals:  Vitals Value Taken Time  BP 122/65 11/19/22 1607  Temp    Pulse 76 11/19/22 1609  Resp 19 11/19/22 1609  SpO2 99 % 11/19/22 1609  Vitals shown include unvalidated device data.  Last Pain:  Vitals:   11/19/22 1336  TempSrc: Temporal  PainSc: 0-No pain      Patients Stated Pain Goal: 0 ([id number])  Complications: No notable events documented.

## 2022-11-19 NOTE — Progress Notes (Signed)
Pt has been NPO since midnight and consent for EGD is in chart.   Richard [name]

## 2022-11-19 NOTE — Anesthesia Preprocedure Evaluation (Addendum)
Anesthesia Evaluation  Patient identified by MRN, date of birth, ID band Patient awake    Reviewed: Allergy & Precautions, NPO status , Patient's Chart, lab work & pertinent test results  History of Anesthesia Complications (+) history of anesthetic complications  Airway Mallampati: II  TM Distance: >3 FB     Dental  (+) Dental Advisory Given   Pulmonary pneumonia, Current Smoker and Patient abstained from smoking.   breath sounds clear to auscultation       Cardiovascular hypertension, + Past MI and +CHF   Rhythm:Regular Rate:Normal     Neuro/Psych    GI/Hepatic PUD,GERD  ,,(+) Hepatitis -  Endo/Other    Renal/GU negative Renal ROS     Musculoskeletal   Abdominal   Peds  Hematology   Anesthesia Other Findings   Reproductive/Obstetrics                             Anesthesia Physical Anesthesia Plan  ASA: 3  Anesthesia Plan: General   Post-op Pain Management: Minimal or no pain anticipated   Induction: Intravenous  PONV Risk Score and Plan: 2 and Ondansetron and Dexamethasone  Airway Management Planned: Oral ETT  Additional Equipment:   Intra-op Plan:   Post-operative Plan: Extubation in OR  Informed Consent: I have reviewed the patients History and Physical, chart, labs and discussed the procedure including the risks, benefits and alternatives for the proposed anesthesia with the patient or authorized representative who has indicated his/her understanding and acceptance.     Dental advisory given  Plan Discussed with: CRNA  Anesthesia Plan Comments:         Anesthesia Quick Evaluation

## 2022-11-19 NOTE — Anesthesia Postprocedure Evaluation (Signed)
Anesthesia Post Note  Patient: Richard [name]  Procedure(s) Performed: ESOPHAGOGASTRODUODENOSCOPY (EGD) WITH PROPOFOL BIOPSY     Patient location during evaluation: PACU Anesthesia Type: General Level of consciousness: sedated and patient cooperative Pain management: pain level controlled Vital Signs Assessment: post-procedure vital signs reviewed and stable Respiratory status: spontaneous breathing Cardiovascular status: stable Anesthetic complications: no   No notable events documented.  Last Vitals:  Vitals:   11/19/22 1615 11/19/22 1628  BP: 105/88 128/78  Pulse: 73 68  Resp: 17 16  Temp:  36.8 C  SpO2: 100% 97%    Last Pain:  Vitals:   11/19/22 1628  TempSrc:   PainSc: 0-No pain                 Nolon Nations

## 2022-11-20 ENCOUNTER — Inpatient Hospital Stay (HOSPITAL_COMMUNITY): Payer: Medicaid Other

## 2022-11-20 DIAGNOSIS — N5089 Other specified disorders of the male genital organs: Secondary | ICD-10-CM

## 2022-11-20 DIAGNOSIS — K920 Hematemesis: Secondary | ICD-10-CM | POA: Diagnosis not present

## 2022-11-20 DIAGNOSIS — K7031 Alcoholic cirrhosis of liver with ascites: Secondary | ICD-10-CM | POA: Diagnosis not present

## 2022-11-20 LAB — BASIC METABOLIC PANEL
Anion gap: 7 (ref 5–15)
BUN: <5 mg/dL — ABNORMAL LOW (ref 6–20)
CO2: 20 mmol/L — ABNORMAL LOW (ref 22–32)
Calcium: 8.3 mg/dL — ABNORMAL LOW (ref 8.9–10.3)
Chloride: 106 mmol/L (ref 98–111)
Creatinine, Ser: 0.68 mg/dL (ref 0.61–1.24)
GFR, Estimated: >60 mL/min (ref >60)
Glucose, Bld: 122 mg/dL — ABNORMAL HIGH (ref 70–99)
Potassium: 4 mmol/L (ref 3.5–5.1)
Sodium: 133 mmol/L — ABNORMAL LOW (ref 135–145)

## 2022-11-20 LAB — CBC
HCT: 24.5 % — ABNORMAL LOW (ref 39.0–52.0)
Hemoglobin: 7.9 g/dL — ABNORMAL LOW (ref 13.0–17.0)
MCH: 26.2 pg (ref 26.0–34.0)
MCHC: 32.2 g/dL (ref 30.0–36.0)
MCV: 81.4 fL (ref 80.0–100.0)
Platelets: 137 10*3/uL — ABNORMAL LOW (ref 150–400)
RBC: 3.01 MIL/uL — ABNORMAL LOW (ref 4.22–5.81)
RDW: 20.3 % — ABNORMAL HIGH (ref 11.5–15.5)
WBC: 5.3 10*3/uL (ref 4.0–10.5)
nRBC: 0 % (ref 0.0–0.2)

## 2022-11-20 LAB — BODY FLUID CULTURE W GRAM STAIN: Culture: NO GROWTH

## 2022-11-20 LAB — MAGNESIUM: Magnesium: 1.6 mg/dL — ABNORMAL LOW (ref 1.7–2.4)

## 2022-11-20 MED ORDER — ORAL CARE MOUTH RINSE: 15.0000 mL | OROMUCOSAL | Status: DC | PRN

## 2022-11-20 MED ORDER — FUROSEMIDE 40 MG PO TABS
40.0000 mg | ORAL_TABLET | Freq: Every day | ORAL | Status: DC
  Administered 2022-11-20 – 2022-11-21 (×2): 40 mg via ORAL
  Filled 2022-11-20 (×2): qty 1

## 2022-11-20 MED ORDER — MAGNESIUM SULFATE 2 GM/50ML IV SOLN
2.0000 g | Freq: Once | INTRAVENOUS | Status: AC
  Administered 2022-11-20: 2 g via INTRAVENOUS
  Filled 2022-11-20: qty 50

## 2022-11-20 NOTE — Progress Notes (Signed)
[facility] Progress Note  CC:  Abdominal pain, alcohol associated cirrhosis     Subjective: He feels well today.  Standing at the sink brushing his teeth.  No nausea or vomiting.  No abdominal pain.  No chest pain or shortness of breath.  Passed a brown stool earlier today.  RN at the bedside.   Objective:  Vital signs in last 24 hours: Temp:  [98.1 F (36.7 C)-98.5 F (36.9 C)] 98.2 F (36.8 C) (02/18 0857) Pulse Rate:  [68-86] 71 (02/18 0857) Resp:  [16-19] 18 (02/18 0857) BP: (104-138)/(65-88) 138/77 (02/18 0857) SpO2:  [97 %-100 %] 100 % (02/18 0857) Weight:  [52 kg] 52 kg (02/17 1336) Last BM Date : 11/18/22 General: Alert sarcopenia appearing male in no acute distress. Heart: Regular rate and rhythm, no murmurs. Pulm: Breath sounds clear few crackles in the bases.  No wheezes or rhonchi. Abdomen: Protuberant, small amount of ascites, abdomen is not tense.  Nontender.  Positive bowel sounds to all 4 quadrants. Extremities: No lower extremity edema. Neurologic:  Alert and  oriented x 4. Grossly normal neurologically. Psych:  Alert and cooperative. Normal mood and affect.  Intake/Output from previous day: 02/17 0701 - 02/18 0700 In: 720 [P.O.:120; I.V.:600] Out: 5 [Blood:5] Intake/Output this shift: Total I/O In: 600 [P.O.:600] Out: 0   Lab Results: Recent Labs    11/18/22 0221 11/18/22 1336 11/19/22 0201 11/20/22 0247  WBC 5.5  --  4.5 5.3  HGB 7.7* 7.8* 7.5* 7.9*  HCT 22.6* 22.8* 22.4* 24.5*  PLT 159  --  165 137*   BMET Recent Labs    11/18/22 0221 11/19/22 0201 11/20/22 0247  NA 131* 130* 133*  K 2.8* 3.7 4.0  CL 97* 102 106  CO2 24 21* 20*  GLUCOSE 100* 141* 122*  BUN <5* <5* <5*  CREATININE 0.59* 0.64 0.68  CALCIUM 8.5* 8.2* 8.3*   LFT Recent Labs    11/18/22 0221  PROT 6.0*  ALBUMIN 2.8*  AST 35  ALT 13  ALKPHOS 103  BILITOT 3.8*   PT/INR Recent Labs    11/18/22 1336 11/19/22 0201  LABPROT 25.0* 23.8*   INR 2.3* 2.2*   Hepatitis Panel No results for input(s): "HEPBSAG", "HCVAB", "HEPAIGM", "HEPBIGM" in the last 72 hours.  US SCROTUM W/DOPPLER  Result Date: 11/20/2022 CLINICAL DATA:  Scrotal swelling. EXAM: SCROTAL ULTRASOUND DOPPLER ULTRASOUND OF THE TESTICLES TECHNIQUE: Complete ultrasound examination of the testicles, epididymis, and other scrotal structures was performed. Color and spectral Doppler ultrasound were also utilized to evaluate blood flow to the testicles. COMPARISON:  CT scan 09/23/2022 FINDINGS: Right testicle Measurements: 3.3 x 1.6 x 1.7 cm. Mildly heterogeneous echogenicity the without focal testicular mass. Patent blood flow. Left testicle Measurements: 3.8 x 2.1 x 2.2 cm. Mildly heterogeneous echogenicity but no focal testicular mass or lesion. Patent blood flow. Right epididymis:  Normal in size and appearance. Left epididymis:  Normal in size and appearance. Hydrocele:  None visualized. Varicocele:  None visualized. Pulsed Doppler interrogation of both testes demonstrates normal low resistance arterial and venous waveforms bilaterally. Other: Marked thickening of the scrotal skin with inflammation/edema. IMPRESSION: 1. Marked thickening of the scrotal skin with inflammation/edema. 2. Small testicles with mildly heterogeneous echogenicity but no focal testicular masses and patent blood flow. 3. No hydrocele. Electronically Signed   By: Marijo Sanes M.D.   On: 11/20/2022 11:57   US ABDOMEN LIMITED WITH LIVER DOPPLER  Result Date: 11/19/2022 CLINICAL DATA:  Hepatic cirrhosis EXAM:  DUPLEX ULTRASOUND OF LIVER TECHNIQUE: Color and duplex Doppler ultrasound was performed to evaluate the hepatic in-flow and out-flow vessels. COMPARISON:  CT 09/23/2022 and previous FINDINGS: Liver: Heterogenous echotexture, nodular contour.  No focal lesion. No intrahepatic biliary ductal dilatation. Main Portal Vein size: 1.1 cm Portal Vein Velocities (all hepatopetal): Main Prox:  20 cm/sec Main Mid:  17 cm/sec Main Dist:  16 cm/sec Right: 27 cm/sec Left: 50 cm/sec Hepatic Vein Velocities (all hepatofugal): Right:  38 cm/sec Middle:  27 cm/sec Left:  35 cm/sec IVC: Present and patent with normal respiratory phasicity. Hepatic Artery Velocity:  161 cm/sec Splenic Vein Velocity:  31 cm/sec Spleen: 6.6 cm x 10.6 cm x 3.5 cm with a total volume of 128 cm^3 (411 cm^3 is upper limit normal) Portal Vein Occlusion/Thrombus: No Splenic Vein Occlusion/Thrombus: No Ascites: Present, mild-to-moderate Varices: None Technologist describes technically difficult study secondary to patient motion during exam. IMPRESSION: 1. Unremarkable hepatic vascular Doppler evaluation. 2. Cirrhotic liver morphology. 3. Ascites. Electronically Signed   By: Lucrezia Europe M.D.   On: 11/19/2022 10:54    Assessment / Plan:  57 year old male with a history of alcohol associated hepatitis, cirrhosis with EV and hepatic encephalopathy and alcohol associated pancreatitis presenting with decompensated cirrhosis with ascites. S/P paracentesis, removed 1.6 L of fluid, no SBP.  Normal renal function.  MELD 20/MELD NA 27 per labs 11/16/2022.   -Lactulose 20 g p.o. twice daily -Continue folate and thiamine daily -2 g low-sodium diet -Defer diuretic recommendations to Dr. Candis Schatz  UGI bleed/hematemesis 11/18/2022.  Diuretics DC'd.  Started on Octreotide and restarted Rocephin. EGD 11/19/2022 identified grade 2 and large (> 58m) esophageal varices, banded and completely eradicated.  Enlarged friable gastric folds, biopsied to rule out infiltrative disorder. -Transfuse for hemoglobin less than 7 -If no further bleeding today or overnight may discontinue Octreotide and Rocephin -PPI p.o. twice daily -Nonselective beta-blocker (Propanolol) at time of discharge -Repeat EGD in 4 weeks for repeat banding   Acute on chronic anemia secondary to cirrhosis with upper GI bleed/esophageal varices.  Hg 8.6 -> 7.6 -> 7.7 -> today Hg 7.9.  -See plan above    Coagulopathy secondary to cirrhosis  Thrombocytopenia secondary to cirrhosis.  Platelet count 137.   Hyponatremia, secondary to cirrhosis and diuretics  Severe malnutrition/sarcopenia -Recommend nutritionist consult, protein 1.2 to 1.5 gm/kg/day    Principal Problem:   Abdominal pain Active Problems:   Hematemesis with nausea   Subacute liver failure without hepatic coma   [place] varices with bleeding [place]     LOS: 3 days   CNoralyn Pick 11/20/2022, 1:33PM

## 2022-11-20 NOTE — Progress Notes (Signed)
[facility] PROGRESS NOTE   Richard [name] [id number] DOB: Dec 16, 1965 DOA: 11/16/2022  PCP: Charlott Rakes, MD  Brief History/Interval Summary: 57 y.o. 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  He presented to the ER with complaints of abdominal distention abdominal pain and swelling of his lower extremities.  Went paracentesis.  Fluid analysis did not raise concern for SBP.  Patient was hospitalized for further management.  Gastroenterology was consulted  Consultants: Gastroenterology  Procedures:  Paracentesis  EGD 11/19/22 Impression:               - The examined portions of the nasopharynx,                            oropharynx and larynx were normal.                           - Grade II and large (> 5 mm) esophageal varices.                            Completely eradicated. Banded.                           - Enlarged, friable gastric folds with poor                            distensbility. Biopsied. ?infiltrative disorder?                           - Normal examined duodenum.                           - Although the patient's hematemesis was rather                            mild (no significant drop in hgb or blood pressure,                            BUN not elevated), there was no other identifiable                            source for hematemesis, and his varices were quite                            prominent and inflamed. Moderate Sedation:      N/A Recommendation:           - Return patient to hospital ward for ongoing care.                           - Full liquid diet. Can advance to soft diet                            tomorrow if no evidence of ongoing bleeding                            -  Continue present medications.                           - Await pathology results.                           - Continue octreotide gtt through tomorrow                           - Continue ceftriaxone through tomorrow                           - Ok to change PPI to PO BID                           - Patient should be discharged on nonselective beta                            blocker (propranolol) given suspected variceal bleed                           - Recommend repeat EGD in 4 weeks for repeat banding    Subjective/Interval History: Patient feels well.  Denies any nausea vomiting or abdominal pain.  He is concerned about scrotal swelling this morning.  Denies any pain in the scrotal area however.       Assessment/Plan:  Abdominal pain in the setting of liver cirrhosis and ascites Patient underwent paracentesis.  No SBP noted on fluid analysis.  Symptoms resolved after paracentesis.    Hematemesis Patient had coffee-ground emesis.  Was seen by gastroenterology.  Placed on PPI and octreotide infusions. Underwent EGD on 2/17.  Esophageal varices were noted which were banded.  Biopsies were taken from the stomach. Seems to be stable this morning.  Will advance to soft diet. Plan is to continue octreotide infusion through today.  Continue ceftriaxone today.  PPI changed over to twice a day. Will initiate propranolol prior to discharge. Will need to repeat EGD in 4 weeks.    Alcoholic liver cirrhosis with associated hyponatremia and hypoalbuminemia/history of alcoholic hepatitis/coagulopathy LFTs are stable.  Sodium level is stable.  Ammonia level was noted to be 53 and patient was started on lactulose which is being continued. INR remains elevated.  Patient was given vitamin K. Consider initiating diuretics prior to discharge.  Hypokalemia/hypomagnesemia Potassium improved to 3.7.  Magnesium was 1.6.  Will be supplemented.  Lower extremity edema right greater than left He has  pitting edema bilateral lower extremities.  This is likely due to liver cirrhosis and hypoalbuminemia.  No concern for DVT at this time.  Compression stockings will be ordered.  Diuretics may help. Uric acid level noted to be normal at 4.7.  No concern for cellulitis at this time.  Scrotal edema Likely due to hypoalbuminemia and fluid overload from liver disease.  Will do scrotal ultrasound.  Essential hypertension He has borderline low blood pressures which is likely due to his liver cirrhosis.   Anemia likely of chronic disease Hemoglobin is stable.  See above under hematemesis.  DVT Prophylaxis: Patient is auto anticoagulated.  With a INR of 2. Code Status: Full Code Family Communication: Discussed with patient Disposition Plan: Continue to mobilize.  Anticipate discharge tomorrow.  Status is: Inpatient  Remains inpatient appropriate because: Hypokalemia      Medications: Scheduled:  folic acid  1 mg Oral Daily   lactulose  20 g Oral BID   lidocaine  1 patch Transdermal Q24H   multivitamin with minerals  1 tablet Oral Daily   pantoprazole  40 mg Oral BID   sodium chloride flush  3 mL Intravenous Q12H   thiamine  100 mg Oral Daily   Or   thiamine  100 mg Intravenous Daily   Continuous:  cefTRIAXone (ROCEPHIN)  IV 2 g (11/18/22 1252)   magnesium sulfate bolus IVPB     octreotide (SANDOSTATIN) 500 mcg in sodium chloride 0.9 % 250 mL (2 mcg/mL) infusion 50 mcg/hr (11/20/22 0115)   [id number] **OR** LORazepam, mouth rinse, mouth rinse  Antibiotics: Anti-infectives (From admission, onward)    Start     Dose/Rate Route Frequency Ordered Stop   11/18/22 1315  cefTRIAXone (ROCEPHIN) 2 g in sodium chloride 0.9 % 100 mL IVPB        2 g 200 mL/hr over 30 Minutes Intravenous Every 24 hours 11/18/22 1225 11/23/22 1314   11/17/22 2200  cefTRIAXone (ROCEPHIN) 2 g in sodium chloride 0.9 % 100 mL IVPB  Status:  Discontinued        2 g 200 mL/hr over 30 Minutes Intravenous Every  24 hours 11/17/22 0315 11/18/22 0909   11/16/22 2145  cefTRIAXone (ROCEPHIN) 2 g in sodium chloride 0.9 % 100 mL IVPB        2 g 200 mL/hr over 30 Minutes Intravenous  Once 11/16/22 2140 11/16/22 2235       Objective:  Vital Signs  Vitals:   11/19/22 1628 11/19/22 2135 11/20/22 0632 11/20/22 0857  BP: 128/78 110/71 115/80 138/77  Pulse: 68 86 73 71  Resp: 16 18 18 18  $ Temp: 98.3 F (36.8 C) 98.5 F (36.9 C) 98.3 F (36.8 C) 98.2 F (36.8 C)  TempSrc:   Oral   SpO2: 97% 100% 100% 100%  Weight:      Height:        Intake/Output Summary (Last 24 hours) at 11/20/2022 0927 Last data filed at 11/20/2022 0636 Gross per 24 hour  Intake 720 ml  Output 5 ml  Net 715 ml    Filed Weights   11/17/22 1224 11/19/22 1336  Weight: 52 kg 52 kg    General appearance: Awake alert.  In no distress Resp: Clear to auscultation bilaterally.  Normal effort Cardio: S1-S2 is normal regular.  No S3-S4.  No rubs murmurs or bruit GI: Abdomen is soft.  Mildly distended.  Nontender. Extremities: Mild bilateral lower extremity though it appears to be better compared to yesterday. Neurologic: Alert and oriented x3.  No focal neurological deficits.     Lab Results:  Data Reviewed: I have personally reviewed following labs and reports of the imaging studies  CBC: Recent Labs  Lab 11/16/22 1642 11/17/22 0520 11/18/22 0221 11/18/22 1336 11/19/22 0201 11/20/22 0247  WBC 7.0 5.7 5.5  --  4.5 5.3  NEUTROABS 4.6  --   --   --   --   --   HGB 8.6* 7.6* 7.7* 7.8* 7.5* 7.9*  HCT 25.4* 23.2* 22.6* 22.8* 22.4* 24.5*  MCV 78.4* 79.5* 77.1*  --  80.0 81.4  PLT 197 166 159  --  165 137*     Basic Metabolic Panel: Recent Labs  Lab 11/16/22 1642 11/17/22 0340 11/18/22 0221 11/19/22 0201 11/20/22 0247  NA 126* 127* 131* 130*  133*  K 2.6* 3.7 2.8* 3.7 4.0  CL 91* 96* 97* 102 106  CO2 23 21* 24 21* 20*  GLUCOSE 105* 109* 100* 141* 122*  BUN <5* <5* <5* <5* <5*  CREATININE 0.75 0.70 0.59*  0.64 0.68  CALCIUM 8.4* 7.7* 8.5* 8.2* 8.3*  MG  --  1.5* 2.2 1.8 1.6*  PHOS  --  2.6  --   --   --      GFR: Estimated Creatinine Clearance: 77.6 mL/min (by C-G formula based on SCr of 0.68 mg/dL).  Liver Function Tests: Recent Labs  Lab 11/16/22 1642 11/17/22 0340 11/18/22 0221  AST 49* 39 35  ALT 15 10 13  $ ALKPHOS 144* 118 103  BILITOT 4.8* 3.7* 3.8*  PROT 6.7 5.3* 6.0*  ALBUMIN 2.1* 1.7* 2.8*     Recent Labs  Lab 11/16/22 1642  LIPASE 30    Recent Labs  Lab 11/16/22 1642  AMMONIA 53*     Coagulation Profile: Recent Labs  Lab 11/16/22 1642 11/18/22 1336 11/19/22 0201  INR 2.0* 2.3* 2.2*      Recent Results (from the past 240 hour(s))  Body fluid culture w Gram Stain     Status: None (Preliminary result)   Collection Time: 11/16/22  9:35 PM   Specimen: Peritoneal Cavity; Peritoneal Fluid  Result Value Ref Range Status   Specimen Description PERITONEAL CAVITY  Final   Special Requests NONE  Final   Gram Stain   Final    FEW WBC PRESENT, PREDOMINANTLY MONONUCLEAR NO ORGANISMS SEEN    Culture   Final    NO GROWTH 3 DAYS Performed at [facility], [place] [address] [place], [place] [address]    Report Status PENDING  Incomplete      Radiology Studies: US ABDOMEN LIMITED WITH LIVER DOPPLER  Result Date: 11/19/2022 CLINICAL DATA:  Hepatic cirrhosis EXAM: DUPLEX ULTRASOUND OF LIVER TECHNIQUE: Color and duplex Doppler ultrasound was performed to evaluate the hepatic in-flow and out-flow vessels. COMPARISON:  CT 09/23/2022 and previous FINDINGS: Liver: Heterogenous echotexture, nodular contour.  No focal lesion. No intrahepatic biliary ductal dilatation. Main Portal Vein size: 1.1 cm Portal Vein Velocities (all hepatopetal): Main Prox:  20 cm/sec Main Mid: 17 cm/sec Main Dist:  16 cm/sec Right: 27 cm/sec Left: 50 cm/sec Hepatic Vein Velocities (all hepatofugal): Right:  38 cm/sec Middle:  27 cm/sec Left:  35 cm/sec IVC: Present and patent with  normal respiratory phasicity. Hepatic Artery Velocity:  161 cm/sec Splenic Vein Velocity:  31 cm/sec Spleen: 6.6 cm x 10.6 cm x 3.5 cm with a total volume of 128 cm^3 (411 cm^3 is upper limit normal) Portal Vein Occlusion/Thrombus: No Splenic Vein Occlusion/Thrombus: No Ascites: Present, mild-to-moderate Varices: None Technologist describes technically difficult study secondary to patient motion during exam. IMPRESSION: 1. Unremarkable hepatic vascular Doppler evaluation. 2. Cirrhotic liver morphology. 3. Ascites. Electronically Signed   By: Lucrezia Europe M.D.   On: 11/19/2022 10:54       LOS: 3 days   [facility] Pager on [email or web address]  11/20/2022, 9:27 AM

## 2022-11-21 ENCOUNTER — Encounter (HOSPITAL_COMMUNITY): Payer: Self-pay | Admitting: Gastroenterology

## 2022-11-21 ENCOUNTER — Other Ambulatory Visit (HOSPITAL_COMMUNITY): Payer: Self-pay

## 2022-11-21 DIAGNOSIS — K7031 Alcoholic cirrhosis of liver with ascites: Secondary | ICD-10-CM | POA: Diagnosis not present

## 2022-11-21 LAB — CBC
HCT: 26.2 % — ABNORMAL LOW (ref 39.0–52.0)
Hemoglobin: 8.2 g/dL — ABNORMAL LOW (ref 13.0–17.0)
MCH: 26 pg (ref 26.0–34.0)
MCHC: 31.3 g/dL (ref 30.0–36.0)
MCV: 83.2 fL (ref 80.0–100.0)
Platelets: 158 10*3/uL (ref 150–400)
RBC: 3.15 MIL/uL — ABNORMAL LOW (ref 4.22–5.81)
RDW: 20.7 % — ABNORMAL HIGH (ref 11.5–15.5)
WBC: 5.6 10*3/uL (ref 4.0–10.5)
nRBC: 0 % (ref 0.0–0.2)

## 2022-11-21 LAB — BASIC METABOLIC PANEL
Anion gap: 10 (ref 5–15)
BUN: 5 mg/dL — ABNORMAL LOW (ref 6–20)
CO2: 21 mmol/L — ABNORMAL LOW (ref 22–32)
Calcium: 8.6 mg/dL — ABNORMAL LOW (ref 8.9–10.3)
Chloride: 100 mmol/L (ref 98–111)
Creatinine, Ser: 0.77 mg/dL (ref 0.61–1.24)
GFR, Estimated: >60 mL/min (ref >60)
Glucose, Bld: 99 mg/dL (ref 70–99)
Potassium: 3.6 mmol/L (ref 3.5–5.1)
Sodium: 131 mmol/L — ABNORMAL LOW (ref 135–145)

## 2022-11-21 MED ORDER — PROPRANOLOL HCL 20 MG PO TABS
20.0000 mg | ORAL_TABLET | Freq: Two times a day (BID) | ORAL | Status: DC
  Administered 2022-11-21: 20 mg via ORAL
  Filled 2022-11-21: qty 1

## 2022-11-21 MED ORDER — LACTULOSE 10 GM/15ML PO SOLN
20.0000 g | Freq: Two times a day (BID) | ORAL | 2 refills | Status: DC
  Filled 2022-11-21: qty 1800, 30d supply, fill #0

## 2022-11-21 MED ORDER — FERROUS GLUCONATE 324 (38 FE) MG PO TABS
324.0000 mg | ORAL_TABLET | Freq: Two times a day (BID) | ORAL | 3 refills | Status: DC
  Filled 2022-11-21: qty 60, 30d supply, fill #0

## 2022-11-21 MED ORDER — PROPRANOLOL HCL 20 MG PO TABS
20.0000 mg | ORAL_TABLET | Freq: Two times a day (BID) | ORAL | 2 refills | Status: DC
  Filled 2022-11-21: qty 60, 30d supply, fill #0

## 2022-11-21 MED ORDER — FUROSEMIDE 40 MG PO TABS
40.0000 mg | ORAL_TABLET | Freq: Every day | ORAL | 2 refills | Status: DC
  Filled 2022-11-21: qty 30, 30d supply, fill #0

## 2022-11-21 MED ORDER — PANTOPRAZOLE SODIUM 40 MG PO TBEC
40.0000 mg | DELAYED_RELEASE_TABLET | Freq: Every day | ORAL | 2 refills | Status: DC
  Filled 2022-11-21: qty 30, 30d supply, fill #0

## 2022-11-21 MED ORDER — SPIRONOLACTONE 25 MG PO TABS
100.0000 mg | ORAL_TABLET | Freq: Every day | ORAL | Status: DC
  Administered 2022-11-21: 100 mg via ORAL
  Filled 2022-11-21: qty 4

## 2022-11-21 MED ORDER — SPIRONOLACTONE 100 MG PO TABS
100.0000 mg | ORAL_TABLET | Freq: Every day | ORAL | 2 refills | Status: DC
  Filled 2022-11-21: qty 30, 30d supply, fill #0

## 2022-11-21 NOTE — TOC Transition Note (Signed)
Transition of Care [facility]) - CM/SW Discharge Note   Patient Details  Name: Richard [name] MRN: [id number] Date of Birth: Mar 06, 1966  [facility] of Care [facility]) CM/SW Contact:  Tom-Johnson, Renea Ee, RN Phone Number: 11/21/2022, 11:24 AM   Clinical Narrative:     Patient is scheduled for discharge today. Outpatient f/u and instructions on AVS. Prescriptions sent to [facility] and meds will be delivered to patient at bedside prior to discharge. Family to transport at discharge. No further TOC needs noted.         Final next level of care: Home/Self Care Barriers to Discharge: Barriers Resolved   Patient Goals and CMS Choice CMS Medicare.gov Compare Post Acute Care list provided to:: Patient Choice offered to / list presented to : NA  Discharge Placement                  Patient to be transferred to facility by: Family      Discharge Plan and Services Additional resources added to the After Visit Summary for                  DME Arranged: N/A DME Agency: NA       HH Arranged: NA HH Agency: NA        Social Determinants of Health (SDOH) Interventions SDOH Screenings   Food Insecurity: Food Insecurity Present (11/17/2022)  Housing: Low Risk  (11/17/2022)  Transportation Needs: No Transportation Needs (11/17/2022)  Utilities: Not At Risk (11/17/2022)  Alcohol Screen: Low Risk  (09/22/2021)  Depression (PHQ2-9): High Risk (10/26/2022)  Financial Resource Strain: High Risk (06/03/2020)  Tobacco Use: High Risk (11/19/2022)     Readmission Risk Interventions    11/05/2021   12:56 PM  Readmission Risk Prevention Plan  Transportation Screening Complete  PCP or Specialist Appt within 5-7 Days Complete  Home Care Screening Complete  Medication Review (RN CM) Complete

## 2022-11-21 NOTE — Discharge Summary (Signed)
[facility]  Physician Discharge Summary   Patient ID: Richard [name] MRN: [id number] DOB/AGE: 55-08-1966 55 y.o.  Admit date: 11/16/2022 Discharge date: 11/21/2022    PCP: Charlott Rakes, MD  DISCHARGE DIAGNOSES:    Hematemesis with nausea   Subacute liver failure without hepatic coma   Secondary esophageal varices with bleeding (HCC) Ascites  RECOMMENDATIONS FOR OUTPATIENT FOLLOW UP: GI to arrange outpatient f/u   Home Health:None  Equipment/Devices:None   CODE STATUS:Full Code   DISCHARGE CONDITION: fair  Diet recommendation: Low sodium  INITIAL HISTORY: 57 y.o. 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  He presented to the ER with complaints of abdominal distention abdominal pain and swelling of his lower extremities.  Went paracentesis.  Fluid analysis did not raise concern for SBP.  Patient was hospitalized for further management.  Gastroenterology was consulted   Consultants: Gastroenterology   Procedures:  Paracentesis   EGD 11/19/22 Impression:               - The examined portions of the nasopharynx,                            oropharynx and larynx were normal.                           - Grade II and large (> 5 mm) esophageal varices.                            Completely eradicated. Banded.                           - Enlarged, friable gastric folds with poor                            distensbility. Biopsied. ?infiltrative disorder?                           - Normal examined duodenum.                           - Although the patient's hematemesis was rather                            mild (no significant drop in hgb or blood pressure,                            BUN not elevated),  there was no other identifiable                            source for hematemesis, and his varices were quite                            prominent and inflamed. Moderate Sedation:      N/A Recommendation:           - Return patient to hospital ward for  ongoing care.                           - Full liquid diet. Can advance to soft diet                            tomorrow if no evidence of ongoing bleeding                           - Continue present medications.                           - Await pathology results.                           - Continue octreotide gtt through tomorrow                           - Continue ceftriaxone through tomorrow                           - Ok to change PPI to PO BID                           - Patient should be discharged on nonselective beta                            blocker (propranolol) given suspected variceal bleed                           - Recommend repeat EGD in 4 weeks for repeat banding     HOSPITAL COURSE:   Abdominal pain in the setting of liver cirrhosis and ascites Patient underwent paracentesis.  No SBP noted on fluid analysis.  Symptoms resolved after paracentesis.     Hematemesis Patient had coffee-ground emesis.  Was seen by gastroenterology.  Placed on PPI and octreotide infusions. Underwent EGD on 2/17.  Esophageal varices were noted which were banded.  Biopsies were taken from the stomach. Will initiate propranolol. PPI once a day. Will need to repeat EGD in 4 weeks.     Alcoholic liver cirrhosis with associated hyponatremia and hypoalbuminemia/history of alcoholic hepatitis/coagulopathy LFTs are stable.  Sodium level is stable.  Ammonia level was noted to be 53 and patient was started on lactulose which is being continued. INR remains elevated.  Patient was given vitamin K. Continue lasix and spironolactone at discharge.   Hypokalemia/hypomagnesemia Corrected.    Lower extremity edema right greater than left He has  pitting edema bilateral lower extremities.  This is likely due to liver cirrhosis and hypoalbuminemia.  No concern for DVT at this time.  Compression stockings will be ordered.  Diuretics may help. Uric acid level noted to be normal at 4.7.  No concern for cellulitis at this time.   Scrotal edema Likely due to hypoalbuminemia and fluid overload from liver disease.  Scrotal ultrasound revealed edema and no other acute concern identified. Patient reassured. Swelling should improve with diuretics.   Essential hypertension He has borderline low blood pressures which is likely due to his liver cirrhosis.    Anemia likely of chronic disease  Hemoglobin is stable.  See above under hematemesis.  Patient is stable. Tolerated his diet. Denies any pain in his scrotal area. He is stable for discharge    PERTINENT LABS:  The results of significant diagnostics from this hospitalization (including imaging, microbiology, ancillary and laboratory) are listed below for reference.    Microbiology: Recent Results (from the past 240 hour(s))  Body fluid culture w Gram Stain     Status: None   Collection Time: 11/16/22  9:35 PM   Specimen: Peritoneal Cavity; Peritoneal Fluid  Result Value Ref Range Status   Specimen Description PERITONEAL CAVITY  Final   Special Requests NONE  Final   Gram Stain   Final    FEW WBC PRESENT, PREDOMINANTLY MONONUCLEAR NO ORGANISMS SEEN    Culture   Final    NO GROWTH 3 DAYS Performed at [facility], [address], [place], [place] [address]    Report Status 11/20/2022 FINAL  Final     Labs:   Basic Metabolic Panel: Recent Labs  Lab 11/17/22 0340 11/18/22 0221 11/19/22 0201 11/20/22 0247 11/21/22 0325  NA 127* 131* 130* 133* 131*  K 3.7 2.8* 3.7 4.0 3.6  CL 96* 97* 102 106 100  CO2 21* 24 21* 20* 21*  GLUCOSE 109* 100* 141* 122* 99  BUN <5* <5* <5* <5* 5*  CREATININE 0.70 0.59* 0.64 0.68 0.77  CALCIUM 7.7* 8.5* 8.2* 8.3* 8.6*  MG 1.5* 2.2 1.8  1.6*  --   PHOS 2.6  --   --   --   --    Liver Function Tests: Recent Labs  Lab 11/16/22 1642 11/17/22 0340 11/18/22 0221  AST 49* 39 35  ALT 15 10 13  $ ALKPHOS 144* 118 103  BILITOT 4.8* 3.7* 3.8*  PROT 6.7 5.3* 6.0*  ALBUMIN 2.1* 1.7* 2.8*   Recent Labs  Lab 11/16/22 1642  LIPASE 30   Recent Labs  Lab 11/16/22 1642  AMMONIA 53*   CBC: Recent Labs  Lab 11/16/22 1642 11/17/22 0520 11/18/22 0221 11/18/22 1336 11/19/22 0201 11/20/22 0247 11/21/22 0325  WBC 7.0 5.7 5.5  --  4.5 5.3 5.6  NEUTROABS 4.6  --   --   --   --   --   --   HGB 8.6* 7.6* 7.7* 7.8* 7.5* 7.9* 8.2*  HCT 25.4* 23.2* 22.6* 22.8* 22.4* 24.5* 26.2*  MCV 78.4* 79.5* 77.1*  --  80.0 81.4 83.2  PLT 197 166 159  --  165 137* 158    BNP: BNP (last 3 results) Recent Labs    11/16/22 1642  BNP 37.0      IMAGING STUDIES US SCROTUM W/DOPPLER  Result Date: 11/20/2022 CLINICAL DATA:  Scrotal swelling. EXAM: SCROTAL ULTRASOUND DOPPLER ULTRASOUND OF THE TESTICLES TECHNIQUE: Complete ultrasound examination of the testicles, epididymis, and other scrotal structures was performed. Color and spectral Doppler ultrasound were also utilized to evaluate blood flow to the testicles. COMPARISON:  CT scan 09/23/2022 FINDINGS: Right testicle Measurements: 3.3 x 1.6 x 1.7 cm. Mildly heterogeneous echogenicity the without focal testicular mass. Patent blood flow. Left testicle Measurements: 3.8 x 2.1 x 2.2 cm. Mildly heterogeneous echogenicity but no focal testicular mass or lesion. Patent blood flow. Right epididymis:  Normal in size and appearance. Left epididymis:  Normal in size and appearance. Hydrocele:  None visualized. Varicocele:  None visualized. Pulsed Doppler interrogation of both testes demonstrates normal low resistance arterial and venous waveforms bilaterally. Other: Marked thickening of the scrotal skin with  inflammation/edema. IMPRESSION: 1. Marked thickening of the scrotal skin with inflammation/edema.  2. Small testicles with mildly heterogeneous echogenicity but no focal testicular masses and patent blood flow. 3. No hydrocele. Electronically Signed   By: Marijo Sanes M.D.   On: 11/20/2022 11:57   US ABDOMEN LIMITED WITH LIVER DOPPLER  Result Date: 11/19/2022 CLINICAL DATA:  Hepatic cirrhosis EXAM: DUPLEX ULTRASOUND OF LIVER TECHNIQUE: Color and duplex Doppler ultrasound was performed to evaluate the hepatic in-flow and out-flow vessels. COMPARISON:  CT 09/23/2022 and previous FINDINGS: Liver: Heterogenous echotexture, nodular contour.  No focal lesion. No intrahepatic biliary ductal dilatation. Main Portal Vein size: 1.1 cm Portal Vein Velocities (all hepatopetal): Main Prox:  20 cm/sec Main Mid: 17 cm/sec Main Dist:  16 cm/sec Right: 27 cm/sec Left: 50 cm/sec Hepatic Vein Velocities (all hepatofugal): Right:  38 cm/sec Middle:  27 cm/sec Left:  35 cm/sec IVC: Present and patent with normal respiratory phasicity. Hepatic Artery Velocity:  161 cm/sec Splenic Vein Velocity:  31 cm/sec Spleen: 6.6 cm x 10.6 cm x 3.5 cm with a total volume of 128 cm^3 (411 cm^3 is upper limit normal) Portal Vein Occlusion/Thrombus: No Splenic Vein Occlusion/Thrombus: No Ascites: Present, mild-to-moderate Varices: None Technologist describes technically difficult study secondary to patient motion during exam. IMPRESSION: 1. Unremarkable hepatic vascular Doppler evaluation. 2. Cirrhotic liver morphology. 3. Ascites. Electronically Signed   By: Lucrezia Europe M.D.   On: 11/19/2022 10:54    DISCHARGE EXAMINATION: Vitals:   11/20/22 1624 11/20/22 2114 11/21/22 0519 11/21/22 0946  BP: 113/68 123/81 (!) 93/48 (!) 101/51  Pulse: 82 81 78 69  Resp: 18 18 18 17  $ Temp: 98.4 F (36.9 C) 98.5 F (36.9 C) 97.9 F (36.6 C) 98.1 F (36.7 C)  TempSrc: Oral Oral Oral   SpO2: 96% 100% 99% 98%  Weight:      Height:       General appearance: alert, cooperative, appears stated age, and no distress Resp: clear to auscultation  bilaterally Cardio: regular rate and rhythm, S1, S2 normal, no murmur, click, rub or gallop GI: Soft, mildly distended. Non tender.  DISPOSITION: Home  Discharge Instructions     Call MD for:  difficulty breathing, headache or visual disturbances   Complete by: As directed    Call MD for:  extreme fatigue   Complete by: As directed    Call MD for:  persistant dizziness or light-headedness   Complete by: As directed    Call MD for:  persistant nausea and vomiting   Complete by: As directed    Call MD for:  severe uncontrolled pain   Complete by: As directed    Call MD for:  temperature >100.4   Complete by: As directed    Diet - low sodium heart healthy   Complete by: As directed    Discharge instructions   Complete by: As directed    Please take your medications as prescribed.  Gastroenterology will arrange outpatient follow-up.  Seek attention if you have further episodes of nausea or vomiting or abdominal pain.  You were cared for by a hospitalist during your hospital stay. If you have any questions about your discharge medications or the care you received while you were in the hospital after you are discharged, you can call the unit and asked to speak with the hospitalist on call if the hospitalist that took care of you is not available. Once you are discharged, your primary care physician will handle any further medical issues. Please note  that NO REFILLS for any discharge medications will be authorized once you are discharged, as it is imperative that you return to your primary care physician (or establish a relationship with a primary care physician if you do not have one) for your aftercare needs so that they can reassess your need for medications and monitor your lab values. If you do not have a primary care physician, you can call [phone] for a physician referral.   Increase activity slowly   Complete by: As directed          Allergies as of 11/21/2022       Reactions    Aspirin Other (See Comments)   Caused acid reflux   Penicillins Hives   Has patient had a PCN reaction causing immediate rash, facial/tongue/throat swelling, SOB or lightheadedness with hypotension: yes Has patient had a PCN reaction causing severe rash involving mucus membranes or skin necrosis: no Has patient had a PCN reaction that required hospitalization: no Has patient had a PCN reaction occurring within the last 10 years: no If all of the above answers are "NO", then may proceed with Cephalosporin use.        Medication List     TAKE these medications    ferrous gluconate 324 MG tablet Commonly known as: FERGON Take 1 tablet (324 mg total) by mouth 2 (two) times daily with a meal.   folic acid 1 MG tablet Commonly known as: FOLVITE Take 1 tablet (1 mg total) by mouth daily.   furosemide 40 MG tablet Commonly known as: LASIX Take 1 tablet (40 mg total) by mouth daily. What changed:  medication strength See the new instructions.   lactulose 10 GM/15ML solution Commonly known as: CHRONULAC Take 30 mLs (20 g total) by mouth 2 (two) times daily. What changed: See the new instructions.   magnesium chloride 64 MG Tbec SR tablet Commonly known as: SLOW-MAG Take 2 tablets (128 mg total) by mouth daily.   naloxone 4 MG/0.1ML Liqd nasal spray kit Commonly known as: NARCAN Place 1 spray into the nose as needed (accidental overdose).   oxyCODONE-acetaminophen 10-325 MG tablet Commonly known as: PERCOCET Take 1 tablet by mouth 4 (four) times daily as needed for pain.   pantoprazole 40 MG tablet Commonly known as: PROTONIX Take 1 tablet (40 mg total) by mouth daily. What changed: See the new instructions.   potassium chloride SA 20 MEQ tablet Commonly known as: KLOR-CON M Take 1 tablet (20 mEq total) by mouth daily.   propranolol 20 MG tablet Commonly known as: INDERAL Take 1 tablet (20 mg total) by mouth 2 (two) times daily.   spironolactone 100 MG  tablet Commonly known as: ALDACTONE Take 1 tablet (100 mg total) by mouth daily.   thiamine 100 MG tablet Commonly known as: VITAMIN B1 Take 1 tablet (100 mg total) by mouth daily.          Follow-up Information     Charlott Rakes, MD. Schedule an appointment as soon as possible for a visit.   Specialty: Family Medicine Why: post hospitalization follow up Contact information: [place] [id number] [phone]         Irene Shipper, MD Follow up.   Specialty: Gastroenterology Contact information: [address] [place] [address] [phone]                 TOTAL DISCHARGE TIME: 35 mins  [facility] Pager on [email or web address]  11/21/2022, 12:00 PM

## 2022-11-22 ENCOUNTER — Telehealth: Payer: Self-pay

## 2022-11-22 LAB — TYPE AND SCREEN
ABO/RH(D): B POS
Antibody Screen: NEGATIVE
Unit division: 0
Unit division: 0
Unit division: 0

## 2022-11-22 LAB — BPAM RBC
Blood Product Expiration Date: 202402232359
Blood Product Expiration Date: 202403052359
Blood Product Expiration Date: 202403062359
ISSUE DATE / TIME: 202402191201
Unit Type and Rh: 7300
Unit Type and Rh: 7300
Unit Type and Rh: 7300

## 2022-11-22 LAB — SURGICAL PATHOLOGY

## 2022-11-22 NOTE — Transitions of Care (Post Inpatient/ED Visit) (Signed)
   11/22/2022  Name: Raynav Ethridge MRN: [id number] DOB: 1966/08/28  Today's TOC FU Call Status: Unsuccessful Call (1st Attempt) Date: 11/22/22  Attempted to reach the patient regarding the most recent Inpatient/ED visit.  Follow Up Plan: Additional outreach attempts will be made to reach the patient to complete the Transitions of Care (Post Inpatient/ED visit) call.   Signature Eden Lathe, RN

## 2022-11-23 ENCOUNTER — Telehealth: Payer: Self-pay

## 2022-11-23 NOTE — Telephone Encounter (Signed)
We can place PCS referral.  Thank you

## 2022-11-23 NOTE — Transitions of Care (Post Inpatient/ED Visit) (Signed)
   11/23/2022  Name: Richard [name] MRN: [id number] DOB: November 25, 1965  Today's TOC FU Call Status: Today's TOC FU Call Status:: Successful TOC FU Call Competed Unsuccessful Call (1st Attempt) Date: 11/22/22  Transition Care Management Follow-up Telephone Call Date of Discharge: 11/21/22 [facility]) Type of Discharge: Inpatient Admission Primary Inpatient Discharge Diagnosis:: abdominal pain How have you been since you were released from the hospital?: Same (he stated that the swelling in his stomach went down but his feel are still painful) Any questions or concerns?: Yes (He said he would like to know " what is going on " with his health.  He explained how he has a difficult time getting around, he can't stand on his feet.) Patient Questions/Concerns:: He said he would like to know " what is going on " with his health. He explained how he has a difficult time getting around, he can't stand on his feet. Patient Questions/Concerns Addressed: Other:, Notified Provider of Patient Questions/Concerns (scheduled him to see Roney Jaffe Clung, PA tomorrow.)  Items Reviewed: Did you receive and understand the discharge instructions provided?: Yes Medications obtained and verified?: No Medications Not Reviewed Reasons:: Other: (he said he has all of his medications and he has the list and did not need to review the med list.) Any new allergies since your discharge?: No Dietary orders reviewed?: Yes Type of Diet Ordered:: heart healthy Do you have support at home?: Yes People in Home: other relative(s) Name of Support/Comfort Primary Source: at first he said he has no help.  His roommate is in a wheelchair and is not able to assist.  Then he said that he has a cousin who checks on him occasionally  Home Care and Equipment/Supplies: Were [place] Ordered?: No Any new equipment or medical supplies ordered?: No  Functional Questionnaire: Do you need assistance  with bathing/showering or dressing?: Yes (referral can be made for PCS if PCP is in agreement.) Do you need assistance with meal preparation?: Yes Do you need assistance with eating?: No Do you have difficulty maintaining continence: No Do you need assistance with getting out of bed/getting out of a chair/moving?: Yes (has cane to use with ambulation. He said he has a difficult time t. around because of the pain/ swelling of his fee) Do you have difficulty managing or taking your medications?: No  Folllow up appointments reviewed: PCP Follow-up appointment confirmed?: Yes Date of PCP follow-up appointment?: 11/24/22 Follow-up Provider: Epimenio Sarin, [facility] Follow-up appointment confirmed?: Yes Date of Specialist follow-up appointment?: 12/29/22 Follow-Up Specialty Provider:: GI Do you need transportation to your follow-up appointment?: No (He uses [facility] if he is not able to get a ride from someone. He knows his insurance co. will provide rides to medical appts. I told him to call this clinic if he needs a ride to his appt tomorrow at [facility]) Do you understand care options if your condition(s) worsen?: Yes-patient verbalized understanding    SIGNATURE: Eden Lathe, RN

## 2022-11-23 NOTE — Telephone Encounter (Signed)
I completed the discharge call with the patient. He would like assistance with personal care and I told him that we can place a PCS referral if Dr Margarita Rana is in agreement.   He said that the swelling of his abdomen is decreased but his feet are still swollen and painful making it difficult for him to walk. He wants to see a provider to find to discuss what is going on with his health. I scheduled him with Epimenio Sarin, PA for tomorrow, 11/25/2021 at [facility].

## 2022-11-24 ENCOUNTER — Inpatient Hospital Stay: Payer: Medicaid Other | Admitting: Physician Assistant

## 2022-11-24 NOTE — Progress Notes (Deleted)
Patient ID: Richard [name], male   DOB: 04-Dec-1965, 57 y.o.   MRN: [id number]   After hospitalization 2/14 to 11/21/2022  From discharge summary: Admit date: 11/16/2022 Discharge date: 11/21/2022     PCP: Charlott Rakes, MD   DISCHARGE DIAGNOSES:    Hematemesis with nausea   Subacute liver failure without hepatic coma   Secondary esophageal varices with bleeding ([place]) Ascites   RECOMMENDATIONS FOR OUTPATIENT FOLLOW UP: GI to arrange outpatient f/u  NITIAL HISTORY: 57 y.o. 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  He presented to the ER with complaints of abdominal distention abdominal pain and swelling of his lower extremities.  Went paracentesis.  Fluid analysis did not raise concern for SBP.  Patient was hospitalized for further management.  Gastroenterology was consulted   HOSPITAL COURSE:    Abdominal pain in the setting of liver cirrhosis and ascites Patient underwent paracentesis.  No SBP noted on fluid analysis.  Symptoms resolved after paracentesis.     Hematemesis Patient had coffee-ground emesis.  Was seen by gastroenterology.  Placed on PPI and octreotide infusions. Underwent EGD on 2/17.  Esophageal varices were noted which were banded.  Biopsies were taken from the stomach. Will initiate propranolol. PPI once a day. Will need to repeat EGD in 4 weeks.     Alcoholic liver cirrhosis with associated hyponatremia and hypoalbuminemia/history of alcoholic hepatitis/coagulopathy LFTs are stable.  Sodium level is stable.  Ammonia level was noted to be 53 and patient was started on lactulose which is being continued. INR remains elevated.  Patient was given vitamin K. Continue lasix and spironolactone at  discharge.   Hypokalemia/hypomagnesemia Corrected.    Lower extremity edema right greater than left He has pitting edema bilateral lower extremities.  This is likely due to liver cirrhosis and hypoalbuminemia.  No concern for DVT at this time.  Compression stockings will be ordered.  Diuretics may help. Uric acid level noted to be normal at 4.7.  No concern for cellulitis at this time.   Scrotal edema Likely due to hypoalbuminemia and fluid overload from liver disease.  Scrotal ultrasound revealed edema and no other acute concern identified. Patient reassured. Swelling should improve with diuretics.   Essential hypertension He has borderline low blood pressures which is likely due to his liver cirrhosis.    Anemia likely of chronic disease Hemoglobin is stable.  See above under hematemesis.   Patient is stable. Tolerated his diet. Denies any pain in his scrotal area. He is stable for discharge

## 2022-11-28 NOTE — Telephone Encounter (Signed)
Signed PCS referral faxed to [facility]

## 2022-11-28 NOTE — Progress Notes (Signed)
Richard [name],  The biopsies of your stomach were essentially unremarkable.  There were no precancerous or significant inflammatory changes noted.  Please follow up with our office for further management of your cirrhosis and to discuss repeat EGD with banding of your varices.

## 2022-12-01 DIAGNOSIS — Z681 Body mass index (BMI) 19 or less, adult: Secondary | ICD-10-CM | POA: Diagnosis not present

## 2022-12-01 DIAGNOSIS — Z79899 Other long term (current) drug therapy: Secondary | ICD-10-CM | POA: Diagnosis not present

## 2022-12-01 DIAGNOSIS — E559 Vitamin D deficiency, unspecified: Secondary | ICD-10-CM | POA: Diagnosis not present

## 2022-12-05 DIAGNOSIS — Z79899 Other long term (current) drug therapy: Secondary | ICD-10-CM | POA: Diagnosis not present

## 2022-12-19 DIAGNOSIS — Z79899 Other long term (current) drug therapy: Secondary | ICD-10-CM | POA: Diagnosis not present

## 2022-12-19 DIAGNOSIS — M545 Low back pain, unspecified: Secondary | ICD-10-CM | POA: Diagnosis not present

## 2022-12-19 DIAGNOSIS — Z681 Body mass index (BMI) 19 or less, adult: Secondary | ICD-10-CM | POA: Diagnosis not present

## 2022-12-29 ENCOUNTER — Ambulatory Visit: Payer: Medicaid Other | Admitting: Internal Medicine

## 2023-01-24 ENCOUNTER — Ambulatory Visit: Payer: Medicaid Other | Admitting: Family Medicine

## 2023-01-25 ENCOUNTER — Ambulatory Visit: Payer: Medicaid Other | Admitting: Family Medicine

## 2023-01-26 ENCOUNTER — Ambulatory Visit: Payer: Medicaid Other | Admitting: Family Medicine

## 2023-01-30 ENCOUNTER — Other Ambulatory Visit: Payer: Self-pay | Admitting: Family Medicine

## 2023-01-30 DIAGNOSIS — K7011 Alcoholic hepatitis with ascites: Secondary | ICD-10-CM

## 2023-01-30 NOTE — Telephone Encounter (Signed)
Medication Refill - Medication: Rx #: [id number]  pantoprazole (PROTONIX) 40 MG tablet [[id number]]   Has the patient contacted their pharmacy? Yes.   (Agent: If no, request that the patient contact the pharmacy for the refill. If patient does not wish to contact the pharmacy document the reason why and proceed with request.) (Agent: If yes, when and what did the pharmacy advise?)  Preferred Pharmacy (with phone number or street name):  [id number] [place], [place] - [address] Phone: [phone]  Fax: [phone]     Has the patient been seen for an appointment in the last year OR does the patient have an upcoming appointment? Yes.    Agent: Please be advised that RX refills may take up to 3 business days. We ask that you follow-up with your pharmacy.

## 2023-01-31 MED ORDER — PANTOPRAZOLE SODIUM 40 MG PO TBEC: 40.0000 mg | DELAYED_RELEASE_TABLET | Freq: Every day | ORAL | 0 refills | Status: DC

## 2023-01-31 NOTE — Telephone Encounter (Signed)
Requested medication (s) are due for refill today: yes  Requested medication (s) are on the active medication list: yes  Last refill:  11/21/22  Future visit scheduled: no  Notes to clinic:  Unable to refill per protocol, last refill by another provider.      Requested Prescriptions  Pending Prescriptions Disp Refills   pantoprazole (PROTONIX) 40 MG tablet 30 tablet 2    Sig: Take 1 tablet (40 mg total) by mouth daily.     Gastroenterology: Proton Pump Inhibitors Passed - 01/30/2023  5:17 PM      Passed - Valid encounter within last 12 months    Recent Outpatient Visits           3 months ago Alcoholic cirrhosis of 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)   [facility] Hoy Register, MD   11 months ago Other chronic pain   [facility] Jonah Blue B, MD   11 months ago Alcoholic cirrhosis of liver without ascites [facility])   [facility] Hoy Register, MD   1 year ago Thoracolumbar back pain   [facility] Hoy Register, MD   1 year ago Hyponatremia   [facility], [place], [place]

## 2023-02-07 ENCOUNTER — Other Ambulatory Visit: Payer: Self-pay | Admitting: Family Medicine

## 2023-02-07 MED ORDER — FUROSEMIDE 40 MG PO TABS: 40.0000 mg | ORAL_TABLET | Freq: Every day | ORAL | 0 refills | Status: DC

## 2023-02-07 MED ORDER — POTASSIUM CHLORIDE CRYS ER 20 MEQ PO TBCR: 20.0000 meq | EXTENDED_RELEASE_TABLET | Freq: Every day | ORAL | 1 refills | Status: DC

## 2023-02-07 NOTE — Telephone Encounter (Signed)
Medication Refill - Medication:  furosemide (LASIX) 40 MG tablet  potassium chloride SA (KLOR-CON M) 20 MEQ tablet   Has the patient contacted their pharmacy? Yes.   (Agent: If no, request that the patient contact the pharmacy for the refill. If patient does not wish to contact the pharmacy document the reason why and proceed with request.) (Agent: If yes, when and what did the pharmacy advise?)  Preferred Pharmacy (with phone number or street name):  [id number] [place], [place] - [address] Phone: [phone]  Fax: [phone]     Has the patient been seen for an appointment in the last year OR does the patient have an upcoming appointment? Yes  Agent: Please be advised that RX refills may take up to 3 business days. We ask that you follow-up with your pharmacy.

## 2023-02-07 NOTE — Telephone Encounter (Signed)
Requested Prescriptions  Pending Prescriptions Disp Refills   furosemide (LASIX) 40 MG tablet 90 tablet 0    Sig: Take 1 tablet (40 mg total) by mouth daily.     Cardiovascular:  Diuretics - Loop Failed - 02/07/2023  2:47 PM      Failed - Ca in normal range and within 180 days    Calcium  Date Value Ref Range Status  11/21/2022 8.6 (L) 8.9 - 10.3 mg/dL Final   Calcium, Ion  Date Value Ref Range Status  09/23/2022 0.81 (LL) 1.15 - 1.40 mmol/L Final         Failed - Na in normal range and within 180 days    Sodium  Date Value Ref Range Status  11/21/2022 131 (L) 135 - 145 mmol/L Final  10/26/2022 133 (L) 134 - 144 mmol/L Final         Failed - Mg Level in normal range and within 180 days    Magnesium  Date Value Ref Range Status  11/20/2022 1.6 (L) 1.7 - 2.4 mg/dL Final    Comment:    Performed at [facility], [address] [place], [place] [address]         Passed - K in normal range and within 180 days    Potassium  Date Value Ref Range Status  11/21/2022 3.6 3.5 - 5.1 mmol/L Final         Passed - Cr in normal range and within 180 days    Creat  Date Value Ref Range Status  11/06/2020 0.60 (L) 0.70 - 1.33 mg/dL Final    Comment:    For patients >55 years of age, the reference limit for Creatinine is approximately 13% higher for people identified as African-American. .    Creatinine, Ser  Date Value Ref Range Status  11/21/2022 0.77 0.61 - 1.24 mg/dL Final         Passed - Cl in normal range and within 180 days    Chloride  Date Value Ref Range Status  11/21/2022 100 98 - 111 mmol/L Final         Passed - Last BP in normal range    BP Readings from Last 1 Encounters:  11/21/22 (!) 101/51         Passed - Valid encounter within last 6 months    Recent Outpatient Visits           3 months ago Alcoholic cirrhosis of 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)   [place] [place], Odette Horns, MD   11 months ago Other chronic  pain   [place] [facility] Jonah Blue B, MD   12 months ago Alcoholic cirrhosis of liver without ascites [facility])   [facility] Hoy Register, MD   1 year ago Thoracolumbar back pain   [facility] [place], Odette Horns, MD   1 year ago Hyponatremia   [facility] [place], Marylene Land M, [place]               potassium chloride SA (KLOR-CON M) 20 MEQ tablet 90 tablet 1    Sig: Take 1 tablet (20 mEq total) by mouth daily.     Endocrinology:  Minerals - Potassium Supplementation Passed - 02/07/2023  2:47 PM      Passed - K in normal range and within 360 days    Potassium  Date  Value Ref Range Status  11/21/2022 3.6 3.5 - 5.1 mmol/L Final         Passed - Cr in normal range and within 360 days    Creat  Date Value Ref Range Status  11/06/2020 0.60 (L) 0.70 - 1.33 mg/dL Final    Comment:    For patients >55 years of age, the reference limit for Creatinine is approximately 13% higher for people identified as African-American. .    Creatinine, Ser  Date Value Ref Range Status  11/21/2022 0.77 0.61 - 1.24 mg/dL Final         Passed - Valid encounter within last 12 months    Recent Outpatient Visits           3 months ago Alcoholic cirrhosis of 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)   [facility] Hoy Register, MD   11 months ago Other chronic pain   [facility] Jonah Blue B, MD   12 months ago Alcoholic cirrhosis of liver without ascites [facility])   [place] [facility] Hoy Register, MD   1 year ago Thoracolumbar back pain    [facility] Hoy Register, MD   1 year ago Hyponatremia   [facility] [place], [place], [place]

## 2023-02-08 ENCOUNTER — Ambulatory Visit (INDEPENDENT_AMBULATORY_CARE_PROVIDER_SITE_OTHER): Payer: Medicaid Other | Admitting: Podiatry

## 2023-02-08 ENCOUNTER — Ambulatory Visit: Payer: Medicaid Other

## 2023-02-08 DIAGNOSIS — M7751 Other enthesopathy of right foot: Secondary | ICD-10-CM | POA: Diagnosis not present

## 2023-02-08 DIAGNOSIS — R609 Edema, unspecified: Secondary | ICD-10-CM | POA: Diagnosis not present

## 2023-02-08 DIAGNOSIS — M7752 Other enthesopathy of left foot: Secondary | ICD-10-CM

## 2023-02-08 MED ORDER — TRIAMCINOLONE ACETONIDE 40 MG/ML IJ SUSP
20.0000 mg | Freq: Once | INTRAMUSCULAR | Status: AC
  Administered 2023-02-08: 20 mg

## 2023-02-08 MED ORDER — GABAPENTIN 100 MG PO CAPS: 100.0000 mg | ORAL_CAPSULE | Freq: Three times a day (TID) | ORAL | 1 refills | Status: DC
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
## 2023-02-10 ENCOUNTER — Other Ambulatory Visit (HOSPITAL_COMMUNITY): Payer: Self-pay

## 2023-02-11 ENCOUNTER — Other Ambulatory Visit: Payer: Self-pay | Admitting: Family Medicine

## 2023-02-11 DIAGNOSIS — K7011 Alcoholic hepatitis with ascites: Secondary | ICD-10-CM

## 2023-02-13 NOTE — Telephone Encounter (Signed)
Requested Prescriptions  Refused Prescriptions Disp Refills   pantoprazole (PROTONIX) 40 MG tablet [Pharmacy Med Name: PANTOPRAZOLE 40MG  TABLETS] 90 tablet     Sig: TAKE 1 TABLET(40 MG) BY MOUTH DAILY     Gastroenterology: Proton Pump Inhibitors Passed - 02/11/2023  4:01 AM      Passed - Valid encounter within last 12 months    Recent Outpatient Visits           3 months ago Alcoholic cirrhosis of 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)   [place] [facility] [place], [place], MD   12 months ago Other chronic pain   [place] [facility] Jonah Blue B, MD   1 year ago Alcoholic cirrhosis of 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)   [place] [facility] [place], Odette Horns, MD   1 year ago Thoracolumbar back pain    [facility] [place], Odette Horns, MD   1 year ago Hyponatremia   [facility] [place], Marylene Land M, [place]               furosemide (LASIX) 20 MG tablet [Pharmacy Med Name: FUROSEMIDE 20MG  TABLETS] 90 tablet 0    Sig: TAKE 1 TABLET(20 MG) BY MOUTH DAILY     Cardiovascular:  Diuretics - Loop Failed - 02/11/2023  4:01 AM      Failed - Ca in normal range and within 180 days    Calcium  Date Value Ref Range Status  11/21/2022 8.6 (L) 8.9 - 10.3 mg/dL Final   Calcium, Ion  Date Value Ref Range Status  09/23/2022 0.81 (LL) 1.15 - 1.40 mmol/L Final         Failed - Na in normal range and within 180 days    Sodium  Date Value Ref Range Status  11/21/2022 131 (L) 135 - 145 mmol/L Final  10/26/2022 133 (L) 134 - 144 mmol/L Final         Failed - Mg Level in normal range and within 180 days    Magnesium  Date Value Ref Range Status  11/20/2022 1.6 (L) 1.7 - 2.4 mg/dL Final    Comment:    Performed at [facility], [address], [place], [place] [address]         Passed - K in normal range and within 180 days    Potassium  Date Value Ref Range  Status  11/21/2022 3.6 3.5 - 5.1 mmol/L Final         Passed - Cr in normal range and within 180 days    Creat  Date Value Ref Range Status  11/06/2020 0.60 (L) 0.70 - 1.33 mg/dL Final    Comment:    For patients >68 years of age, the reference limit for Creatinine is approximately 13% higher for people identified as African-American. .    Creatinine, Ser  Date Value Ref Range Status  11/21/2022 0.77 0.61 - 1.24 mg/dL Final         Passed - Cl in normal range and within 180 days    Chloride  Date Value Ref Range Status  11/21/2022 100 98 - 111 mmol/L Final         Passed - Last BP in normal range    BP Readings from Last 1 Encounters:  11/21/22 (!) 101/51         Passed - Valid encounter within last 6 months    Recent Outpatient  Visits           3 months ago Alcoholic cirrhosis of liver without ascites [facility])   [place] [facility] Hoy Register, MD   12 months ago Other chronic pain   [place] [facility] Marcine Matar, MD   1 year ago Alcoholic cirrhosis of liver without ascites [facility])   [facility] Hoy Register, MD   1 year ago Thoracolumbar back pain   [facility] Hoy Register, MD   1 year ago Hyponatremia   [facility], [place], [place]

## 2023-03-01 ENCOUNTER — Other Ambulatory Visit: Payer: Self-pay | Admitting: Family Medicine

## 2023-03-01 DIAGNOSIS — K7011 Alcoholic hepatitis with ascites: Secondary | ICD-10-CM

## 2023-03-06 ENCOUNTER — Other Ambulatory Visit: Payer: Self-pay | Admitting: Family Medicine

## 2023-03-06 DIAGNOSIS — K7011 Alcoholic hepatitis with ascites: Secondary | ICD-10-CM

## 2023-03-06 NOTE — Telephone Encounter (Signed)
Medication Refill - Medication: pantoprazole (PROTONIX) 40 MG tablet   Has the patient contacted their pharmacy? yes (Agent: If no, request that the patient contact the pharmacy for the refill. If patient does not wish to contact the pharmacy document the reason why and proceed with request.) (Agent: If yes, when and what did the pharmacy advise?)contact pcp   Preferred Pharmacy (with phone number or street name):  [id number] - [place], [place] - [address] AT [facility] [address] [facility] [address] Phone: [phone]  Fax: [phone]     Has the patient been seen for an appointment in the last year OR does the patient have an upcoming appointment? yes  Agent: Please be advised that RX refills may take up to 3 business days. We ask that you follow-up with your pharmacy.

## 2023-03-07 MED ORDER — PANTOPRAZOLE SODIUM 40 MG PO TBEC: 40.0000 mg | DELAYED_RELEASE_TABLET | Freq: Every day | ORAL | 0 refills | Status: DC

## 2023-03-07 NOTE — Telephone Encounter (Signed)
Last OV 10/26/22, meets protocol.  Requested Prescriptions  Pending Prescriptions Disp Refills   pantoprazole (PROTONIX) 40 MG tablet 90 tablet 0    Sig: Take 1 tablet (40 mg total) by mouth daily.     Gastroenterology: Proton Pump Inhibitors Passed - 03/06/2023 11:30 AM      Passed - Valid encounter within last 12 months    Recent Outpatient Visits           4 months ago Alcoholic cirrhosis of 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)   [facility] Hoy Register, MD   1 year ago Other chronic pain   [place] [facility] Marcine Matar, MD   1 year ago Alcoholic cirrhosis of liver without ascites [facility])   [place] [facility] Hoy Register, MD   1 year ago Thoracolumbar back pain   [place] [facility] Hoy Register, MD   1 year ago Hyponatremia   [facility] [place], Marzella [place], [place]       Future Appointments             In 2 months Hoy Register, MD [place] [facility]

## 2023-03-13 ENCOUNTER — Ambulatory Visit: Payer: Self-pay

## 2023-03-13 NOTE — Telephone Encounter (Signed)
Call placed to patient unable to reach or leave message recording voices unable to leave message at this time.
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
## 2023-04-13 ENCOUNTER — Telehealth: Payer: Self-pay

## 2023-04-13 NOTE — Telephone Encounter (Signed)
..   Medicaid Managed Care   Unsuccessful Outreach Note  04/13/2023 Name: Richard [name] MRN: [id number] DOB: Apr 02, 1966  Referred by: Hoy Register, MD Reason for referral : Appointment (At the request of Mr.[facility] plan, I reached out to him today to get him scheduled with the MM Team.I tried the number that was sent in the referral, [phone] and the number in his chart. Neither number was in order. I will make another attempt tomorrow.)   An unsuccessful telephone outreach was attempted today. The patient was referred to the case management team for assistance with care management and care coordination.   Follow Up Plan: The care management team will reach out to the patient again over the next 3 days.   Weston Settle Care Guide  [facility] Managed  Care Guide [facility]  [phone]

## 2023-04-14 ENCOUNTER — Telehealth: Payer: Self-pay | Admitting: Family Medicine
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
## 2023-04-14 NOTE — Telephone Encounter (Signed)
Copied from CRM [id number]. Topic: General - Inquiry >> Apr 14, 2023 11:17 AM Patsy Lager T wrote: Reason for CRM: patient stated provider would need to contact [facility] at [phone] to get him enrolled in the program that will help him with food and other needs. Please f/u with patient for additional info

## 2023-04-17 NOTE — Telephone Encounter (Signed)
He needs an office visit to address this so that back pain can be documented in PCP visit which will need to accompany his referral.

## 2023-04-18 ENCOUNTER — Telehealth: Payer: Self-pay

## 2023-04-18 NOTE — Telephone Encounter (Signed)
..   Medicaid Managed Care   Unsuccessful Outreach Note  04/18/2023 Name: Richard [name] MRN: [id number] DOB: April 13, 1966  Referred by: Hoy Register, MD Reason for referral : Appointment (I attempted to reach the patient on 04/17/23 and 04/18/23. I called the number in the chart and the number that his insurance plan sent with their referral. Neither number was in working order.)   Third unsuccessful telephone outreach was attempted today. The patient was referred to the case management team for assistance with care management and care coordination. The patient's primary care provider has been notified of our unsuccessful attempts to make or maintain contact with the patient. The care management team is pleased to engage with this patient at any time in the future should he/she be interested in assistance from the care management team.   Follow Up Plan: We have been unable to make contact with the patient for follow up. The care management team is available to follow up with the patient after provider conversation with the patient regarding recommendation for care management engagement and subsequent re-referral to the care management team.   Weston Settle Care Guide  [facility] Managed  Care Guide [facility]  [phone]

## 2023-04-19 ENCOUNTER — Ambulatory Visit: Payer: Self-pay | Admitting: *Deleted

## 2023-04-19 NOTE — Telephone Encounter (Signed)
Spoke with patient . Verified name & DOB    Advised patient that due to his current weight gain over the last couple of days, bilateral lower extremity edema , coughing and SOB he needs to be evaluated at in the ED. Patient is agreeable to go.

## 2023-04-19 NOTE — Telephone Encounter (Signed)
  Chief Complaint: bilateral foot pain and swelling, concerns with appetite  Symptoms: bilateral feet swelling up to shins. Pain in feet pain with walking limping uses cane. When wearing socks noted with indention on legs. C/o back issues and now feet swelling. Reports taking more than recommended tylenol due to pain. Taking approx every 3 hours. Reports poor appetite and utilizing "joint" at times to increase appetite. Weight was 105lbs to 130 lbs now . Requesting medication that may help with appetite instead of "joint" Frequency: greater than 1 year Pertinent Negatives: Patient denies fever, no reports of chest pain no difficulty breathing  Disposition: [] ED /[] Urgent Care (no appt availability in office) / [] Appointment(In office/virtual)/ []  [place] [facility]/ [] Home Care/ [] Refused Recommended Disposition /[x] [place] [facility]/ []  Follow-up with PCP Additional Notes:   Recommended mobile bus today . Requesting if assistance can be given regarding case management or SW assistance for new housing and insurance [facility] , needs for housing approval. Please call patient at #[phone] .  Patient verified email as :  [email or web address].    Reason for Disposition  [1] Swollen foot AND [2] no fever  (Exceptions: localized bump from bunions, calluses, insect bite, sting)  Answer Assessment - Initial Assessment Questions 1. ONSET: "When did the pain start?"      Greater than 1 year  2. LOCATION: "Where is the pain located?"      Bilateral feet  3. PAIN: "How bad is the pain?"    (Scale 1-10; or mild, moderate, severe)  - MILD (1-3): doesn't interfere with normal activities.   - MODERATE (4-7): interferes with normal activities (e.g., work or school) or awakens from sleep, limping.   - SEVERE (8-10): excruciating pain, unable to do any normal activities, unable to walk.      Limping uses cane , difficulty sleeping  4. WORK OR EXERCISE: "Has there been any recent work or exercise  that involved this part of the body?"      na 5. CAUSE: "What do you think is causing the foot pain?"     Swelling  6. OTHER SYMPTOMS: "Do you have any other symptoms?" (e.g., leg pain, rash, fever, numbness)     Swelling feet to shins, pain in bilateral feet  7. PREGNANCY: "Is there any chance you are pregnant?" "When was your last menstrual period?"     na  Protocols used: Foot Pain-A-AH

## 2023-04-19 NOTE — Telephone Encounter (Signed)
Phone call placed to patient and informed patient that I will speak with our case manager in regards to the request. Also I will reach out [facility] to get more information
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
## 2023-04-26 ENCOUNTER — Telehealth: Payer: Self-pay | Admitting: Family Medicine

## 2023-04-26 NOTE — Telephone Encounter (Signed)
Patient called in to see where provider is with speaking with [facility]. Please f/u with patient

## 2023-04-27 NOTE — Telephone Encounter (Signed)
Pt was called and a Vm was left informing patient that he will need to reach out to his insurance company and request that paperwork be sent to Dr.Newlin for completion.

## 2023-05-08 ENCOUNTER — Other Ambulatory Visit: Payer: Self-pay | Admitting: Family Medicine

## 2023-05-08 DIAGNOSIS — K7011 Alcoholic hepatitis with ascites: Secondary | ICD-10-CM

## 2023-05-09 ENCOUNTER — Other Ambulatory Visit: Payer: Self-pay | Admitting: *Deleted

## 2023-05-09 ENCOUNTER — Ambulatory Visit: Payer: Self-pay | Admitting: *Deleted

## 2023-05-09 NOTE — Telephone Encounter (Signed)
Requested medication (s) are due for refill today: routing for review  Requested medication (s) are on the active medication list: yes  Last refill:  11/21/22  Future visit scheduled: yes  Notes to clinic:  Unable to refill per protocol, last refill by another provider.      Requested Prescriptions  Pending Prescriptions Disp Refills   lactulose (CHRONULAC) 10 GM/15ML solution [Pharmacy Med Name: LACTULOSE 10GM/15ML SOLUTION]      Sig: TAKE 30 ML BY MOUTH TWICE DAILY     Gastroenterology:  Laxatives - lactulose Failed - 05/08/2023  8:25 AM      Failed - CO2 in normal range and within 360 days    CO2  Date Value Ref Range Status  11/21/2022 21 (L) 22 - 32 mmol/L Final   Bicarbonate  Date Value Ref Range Status  08/14/2018 17.5 (L) 20.0 - 28.0 mmol/L Final         Failed - Na in normal range and within 360 days    Sodium  Date Value Ref Range Status  11/21/2022 131 (L) 135 - 145 mmol/L Final  10/26/2022 133 (L) 134 - 144 mmol/L Final         Passed - Cl in normal range and within 360 days    Chloride  Date Value Ref Range Status  11/21/2022 100 98 - 111 mmol/L Final         Passed - K in normal range and within 360 days    Potassium  Date Value Ref Range Status  11/21/2022 3.6 3.5 - 5.1 mmol/L Final         Passed - Valid encounter within last 12 months    Recent Outpatient Visits           6 months ago Alcoholic cirrhosis of 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)   [place] [facility] Hoy Register, MD   1 year ago Other chronic pain   [place] [facility] Marcine Matar, MD   1 year ago Alcoholic cirrhosis of 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)   [place] [facility] Hoy [facility], MD   1 year ago Thoracolumbar back pain   [place] [facility] Hoy Register, MD   1 year ago Hyponatremia   [place] [facility] [place], [place], [place]        Future Appointments             Tomorrow [place], Marzella Schlein, PA-C [place] [facility]   In 2 weeks Hoy Register, MD [place] [facility]            Refused Prescriptions Disp Refills   furosemide (LASIX) 20 MG tablet [Pharmacy Med Name: FUROSEMIDE 20MG  TABLETS] 90 tablet 0    Sig: TAKE 1 TABLET(20 MG) BY MOUTH DAILY     Cardiovascular:  Diuretics - Loop Failed - 05/08/2023  8:25 AM      Failed - Ca in normal range and within 180 days    Calcium  Date Value Ref Range Status  11/21/2022 8.6 (L) 8.9 - 10.3 mg/dL Final   Calcium, Ion  Date Value Ref Range Status  09/23/2022 0.81 (LL) 1.15 - 1.40 mmol/L Final         Failed - Na in normal range and within 180 days    Sodium  Date Value Ref Range Status  11/21/2022 131 (L) 135 -  145 mmol/L Final  10/26/2022 133 (L) 134 - 144 mmol/L Final         Failed - Mg Level in normal range and within 180 days    Magnesium  Date Value Ref Range Status  11/20/2022 1.6 (L) 1.7 - 2.4 mg/dL Final    Comment:    Performed at [facility], [address] [place], [place] [address]         Failed - Valid encounter within last 6 months    Recent Outpatient Visits           6 months ago Alcoholic cirrhosis of 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)   [place] [facility] Hoy Register, MD   1 year ago Other chronic pain   [place] [facility] Marcine Matar, MD   1 year ago Alcoholic cirrhosis of liver without ascites [facility])   [place] [facility] Hoy Register, MD   1 year ago Thoracolumbar back pain   [place] [facility] Hoy Register, MD   1 year ago Hyponatremia   [place] [facility] [place], [place], [place]       Future Appointments             Tomorrow Sharon Seller, [place] [facility]   In 2 weeks  Hoy Register, MD [facility]            Passed - K in normal range and within 180 days    Potassium  Date Value Ref Range Status  11/21/2022 3.6 3.5 - 5.1 mmol/L Final         Passed - Cr in normal range and within 180 days    Creat  Date Value Ref Range Status  11/06/2020 0.60 (L) 0.70 - 1.33 mg/dL Final    Comment:    For patients >31 years of age, the reference limit for Creatinine is approximately 13% higher for people identified as African-American. .    Creatinine, Ser  Date Value Ref Range Status  11/21/2022 0.77 0.61 - 1.24 mg/dL Final         Passed - Cl in normal range and within 180 days    Chloride  Date Value Ref Range Status  11/21/2022 100 98 - 111 mmol/L Final         Passed - Last BP in normal range    BP Readings from Last 1 Encounters:  11/21/22 (!) 101/51

## 2023-05-09 NOTE — Telephone Encounter (Signed)
Requested medication (s) are due for refill today: yes   Requested medication (s) are on the active medication list: yes   Last refill:  lasix- 02/07/23 #90 0 refills. Lactulose- 10/08/22 #60 0 refills  Future visit scheduled: yes tomorrow  Notes to clinic:  no refills remain, lactulose last ordered by Dr. Elmer Sow. Do you want to refill Rx? Patient reports he is out of medication     Requested Prescriptions  Pending Prescriptions Disp Refills   furosemide (LASIX) 40 MG tablet 90 tablet 0    Sig: Take 1 tablet (40 mg total) by mouth daily.     Cardiovascular:  Diuretics - Loop Failed - 05/09/2023 12:21 PM      Failed - Ca in normal range and within 180 days    Calcium  Date Value Ref Range Status  11/21/2022 8.6 (L) 8.9 - 10.3 mg/dL Final   Calcium, Ion  Date Value Ref Range Status  09/23/2022 0.81 (LL) 1.15 - 1.40 mmol/L Final         Failed - Na in normal range and within 180 days    Sodium  Date Value Ref Range Status  11/21/2022 131 (L) 135 - 145 mmol/L Final  10/26/2022 133 (L) 134 - 144 mmol/L Final         Failed - Mg Level in normal range and within 180 days    Magnesium  Date Value Ref Range Status  11/20/2022 1.6 (L) 1.7 - 2.4 mg/dL Final    Comment:    Performed at [facility], 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)   [place] [facility] Hoy Register, MD   1 year ago Other chronic pain   [place] [facility] Marcine Matar, MD   1 year ago Alcoholic cirrhosis of liver without ascites [facility])   [place] [facility] Hoy Register, MD   1 year ago Thoracolumbar back pain   [place] [facility] Hoy Register, MD   1 year ago Hyponatremia   [place] [facility] [place], [place], [place]       Future Appointments             Tomorrow Sharon Seller, [place] [facility]   In 2 weeks Hoy Register, MD [facility]            Passed - K in normal range and within 180 days    Potassium  Date Value Ref Range Status  11/21/2022 3.6 3.5 - 5.1 mmol/L Final         Passed - Cr in normal range and within 180 days    Creat  Date Value Ref Range Status  11/06/2020 0.60 (L) 0.70 - 1.33 mg/dL Final    Comment:    For patients >79 years of age, the reference limit for Creatinine is approximately 13% higher for people identified as African-American. .    Creatinine, Ser  Date Value Ref Range Status  11/21/2022 0.77 0.61 - 1.24 mg/dL Final         Passed - Cl in normal range and within  180 days    Chloride  Date Value Ref Range Status  11/21/2022 100 98 - 111 mmol/L Final         Passed - Last BP in normal range    BP Readings from Last 1 Encounters:  11/21/22 (!) 101/51          lactulose (CHRONULAC) 10 GM/15ML solution 1800 mL 2    Sig: Take 30 mLs (20 g total) by mouth 2 (two) times daily.     Gastroenterology:  Laxatives - lactulose Failed - 05/09/2023 12:21 PM      Failed - CO2 in normal range and within 360 days    CO2  Date Value Ref Range Status  11/21/2022 21 (L) 22 - 32 mmol/L Final   Bicarbonate  Date Value Ref Range Status  08/14/2018 17.5 (L) 20.0 - 28.0 mmol/L Final         Failed - Na in normal range and within 360 days    Sodium  Date Value Ref Range Status  11/21/2022 131 (L) 135 - 145 mmol/L Final  10/26/2022 133 (L) 134 - 144 mmol/L Final         Passed - Cl in normal range and within 360 days    Chloride  Date Value Ref Range Status  11/21/2022 100 98 - 111 mmol/L Final         Passed - K in normal range and within 360 days    Potassium  Date Value Ref Range Status  11/21/2022 3.6 3.5 - 5.1 mmol/L Final          Passed - Valid encounter within last 12 months    Recent Outpatient Visits           6 months ago Alcoholic cirrhosis of 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)   [place] [facility] Hoy [facility], MD   1 year ago Other chronic pain   [place] [facility] Marcine Matar, MD   1 year ago Alcoholic cirrhosis of liver without ascites [facility])   [place] [facility] Hoy Register, MD   1 year ago Thoracolumbar back pain   [place] [facility] Hoy Register, MD   1 year ago Hyponatremia   [facility] [place], Marzella Schlein, [place]       Future Appointments             Tomorrow Sharon Seller, Marzella Schlein, PA-C [place] [facility]   In 2 weeks Hoy Register, MD [facility]

## 2023-05-09 NOTE — Telephone Encounter (Signed)
Call placed to patient unable to reach message left on VM.  Call to advise patient that he has an appointment with one of our providers on 05/10/2023 and some of his concerns  can be addressed at this time

## 2023-05-09 NOTE — Telephone Encounter (Signed)
Unable to refill per protocol, Rx expired. Lasix discontinued 11/21/22.  Requested Prescriptions  Pending Prescriptions Disp Refills   furosemide (LASIX) 20 MG tablet [Pharmacy Med Name: FUROSEMIDE 20MG  TABLETS] 90 tablet 0    Sig: TAKE 1 TABLET(20 MG) BY MOUTH DAILY     Cardiovascular:  Diuretics - Loop Failed - 05/08/2023  8:25 AM      Failed - Ca in normal range and within 180 days    Calcium  Date Value Ref Range Status  11/21/2022 8.6 (L) 8.9 - 10.3 mg/dL Final   Calcium, Ion  Date Value Ref Range Status  09/23/2022 0.81 (LL) 1.15 - 1.40 mmol/L Final         Failed - Na in normal range and within 180 days    Sodium  Date Value Ref Range Status  11/21/2022 131 (L) 135 - 145 mmol/L Final  10/26/2022 133 (L) 134 - 144 mmol/L Final         Failed - Mg Level in normal range and within 180 days    Magnesium  Date Value Ref Range Status  11/20/2022 1.6 (L) 1.7 - 2.4 mg/dL Final    Comment:    Performed at [facility], 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)   [place] [facility] Hoy Register, MD   1 year ago Other chronic pain   [place] [facility] Marcine Matar, MD   1 year ago Alcoholic cirrhosis of liver without ascites [facility])   [place] [facility] Hoy Register, MD   1 year ago Thoracolumbar back pain   [place] [facility] Hoy Register, MD   1 year ago Hyponatremia   [place] [facility] [place], [place], [place]       Future Appointments             Tomorrow Sharon Seller, [place] [facility]   In 2 weeks Hoy Register, MD [facility]            Passed - K in normal range and  within 180 days    Potassium  Date Value Ref Range Status  11/21/2022 3.6 3.5 - 5.1 mmol/L Final         Passed - Cr in normal range and within 180 days    Creat  Date Value Ref Range Status  11/06/2020 0.60 (L) 0.70 - 1.33 mg/dL Final    Comment:    For patients >36 years of age, the reference limit for Creatinine is approximately 13% higher for people identified as African-American. .    Creatinine, Ser  Date Value Ref Range Status  11/21/2022 0.77 0.61 - 1.24 mg/dL Final         Passed - Cl in normal range and within 180 days    Chloride  Date Value Ref Range Status  11/21/2022 100 98 - 111 mmol/L Final         Passed - Last BP in normal range    BP Readings from Last 1 Encounters:  11/21/22 (!) 101/51          lactulose (  CHRONULAC) 10 GM/15ML solution [Pharmacy Med Name: LACTULOSE 10GM/15ML SOLUTION]      Sig: TAKE 30 ML BY MOUTH TWICE DAILY     Gastroenterology:  Laxatives - lactulose Failed - 05/08/2023  8:25 AM      Failed - CO2 in normal range and within 360 days    CO2  Date Value Ref Range Status  11/21/2022 21 (L) 22 - 32 mmol/L Final   Bicarbonate  Date Value Ref Range Status  08/14/2018 17.5 (L) 20.0 - 28.0 mmol/L Final         Failed - Na in normal range and within 360 days    Sodium  Date Value Ref Range Status  11/21/2022 131 (L) 135 - 145 mmol/L Final  10/26/2022 133 (L) 134 - 144 mmol/L Final         Passed - Cl in normal range and within 360 days    Chloride  Date Value Ref Range Status  11/21/2022 100 98 - 111 mmol/L Final         Passed - K in normal range and within 360 days    Potassium  Date Value Ref Range Status  11/21/2022 3.6 3.5 - 5.1 mmol/L Final         Passed - Valid encounter within last 12 months    Recent Outpatient Visits           6 months ago Alcoholic cirrhosis of 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)   [place] [facility] Hoy Register, MD   1 year ago Other chronic pain   [place]  [facility] Marcine Matar, MD   1 year ago Alcoholic cirrhosis of liver without ascites [facility])   [place] [facility] Hoy Register, MD   1 year ago Thoracolumbar back pain   [place] [facility] Hoy Register, MD   1 year ago Hyponatremia   [facility] [place], Marzella Schlein, [place]       Future Appointments             Tomorrow Sharon Seller, Marzella Schlein, PA-C [place] [facility]   In 2 weeks Hoy Register, MD [facility]

## 2023-05-09 NOTE — Telephone Encounter (Signed)
  Chief Complaint: pain in feet, abdominal pain, no BM x 2 days  , requesting refill of medications Symptoms: bilateral feet swelling and pain, abdominal pain, c/o out of medications.  Frequency: Sunday  Pertinent Negatives: Patient denies na  Disposition: [] ED /[] Urgent Care (no appt availability in office) / [x] Appointment(In office/virtual)/ []  [place] [facility]/ [] Home Care/ [] Refused Recommended Disposition /[] [place] [facility]/ []  Follow-up with PCP Additional Notes:   Requesting medication refills. Recommended to contact kidney dr for lactulose refill. Patient upset and requesting he wants home health services . Appt scheduled for pain issues for tomorrow. Patient reports he can get a ride. Please advise. Appt scheduled with PCP for 05/29/23.   Reason for Disposition  [1] Swollen foot AND [2] no fever  (Exceptions: localized bump from bunions, calluses, insect bite, sting)  Answer Assessment - Initial Assessment Questions 1. ONSET: "When did the pain start?"      On going  2. LOCATION: "Where is the pain located?"      Bilateral feet  3. PAIN: "How bad is the pain?"    (Scale 1-10; or mild, moderate, severe)  - MILD (1-3): doesn't interfere with normal activities.   - MODERATE (4-7): interferes with normal activities (e.g., work or school) or awakens from sleep, limping.   - SEVERE (8-10): excruciating pain, unable to do any normal activities, unable to walk.      Pain with walking  4. WORK OR EXERCISE: "Has there been any recent work or exercise that involved this part of the body?"      na 5. CAUSE: "What do you think is causing the foot pain?"     Out of medication lasix  6. OTHER SYMPTOMS: "Do you have any other symptoms?" (e.g., leg pain, rash, fever, numbness)     Pain with walking difficulty wearing shoes and socks. Out of medication lasix c/o abdominal pain no BM since Sunday out of lactulose  7. PREGNANCY: "Is there any chance you are pregnant?" "When was your  last menstrual period?"     na  Protocols used: Foot Pain-A-AH

## 2023-05-10 ENCOUNTER — Other Ambulatory Visit: Payer: Self-pay

## 2023-05-10 ENCOUNTER — Encounter: Payer: Self-pay | Admitting: Physician Assistant

## 2023-05-10 ENCOUNTER — Ambulatory Visit: Payer: Medicaid Other | Admitting: Physician Assistant

## 2023-05-10 VITALS — BP 118/78 | HR 85 | Wt 136.0 lb

## 2023-05-10 DIAGNOSIS — K7031 Alcoholic cirrhosis of liver with ascites: Secondary | ICD-10-CM | POA: Diagnosis not present

## 2023-05-10 DIAGNOSIS — Z789 Other specified health status: Secondary | ICD-10-CM

## 2023-05-10 DIAGNOSIS — M79671 Pain in right foot: Secondary | ICD-10-CM

## 2023-05-10 DIAGNOSIS — E871 Hypo-osmolality and hyponatremia: Secondary | ICD-10-CM | POA: Diagnosis not present

## 2023-05-10 DIAGNOSIS — K7011 Alcoholic hepatitis with ascites: Secondary | ICD-10-CM | POA: Diagnosis not present

## 2023-05-10 DIAGNOSIS — M4802 Spinal stenosis, cervical region: Secondary | ICD-10-CM

## 2023-05-10 MED ORDER — GABAPENTIN 100 MG PO CAPS: 100.0000 mg | ORAL_CAPSULE | Freq: Three times a day (TID) | ORAL | 1 refills | Status: DC

## 2023-05-10 MED ORDER — METHOCARBAMOL 500 MG PO TABS: 500.0000 mg | ORAL_TABLET | Freq: Three times a day (TID) | ORAL | 1 refills | Status: DC | PRN

## 2023-05-10 MED ORDER — FUROSEMIDE 40 MG PO TABS: 40.0000 mg | ORAL_TABLET | Freq: Every day | ORAL | 0 refills | Status: DC

## 2023-05-10 MED ORDER — LACTULOSE 10 GM/15ML PO SOLN: 20.0000 g | Freq: Two times a day (BID) | ORAL | 2 refills | Status: DC

## 2023-05-10 MED ORDER — SPIRONOLACTONE 100 MG PO TABS: 100.0000 mg | ORAL_TABLET | Freq: Every day | ORAL | 2 refills | Status: DC

## 2023-05-10 MED ORDER — METHOCARBAMOL 500 MG PO TABS
500.0000 mg | ORAL_TABLET | Freq: Three times a day (TID) | ORAL | 1 refills | Status: DC | PRN
Start: 2023-05-10 — End: 2023-05-10

## 2023-05-10 MED ORDER — FUROSEMIDE 40 MG PO TABS
40.0000 mg | ORAL_TABLET | Freq: Every day | ORAL | 0 refills | Status: DC
Start: 2023-05-10 — End: 2023-09-28

## 2023-05-10 NOTE — Progress Notes (Signed)
Patient ID: Richard [name], male   DOB: 03-16-1966, 57 y.o.   MRN: [id number]   Richard [name], is a 57 y.o. male  CZY:[id number]  UXN:[id number]  DOB - 1966-03-28  Chief Complaint  Patient presents with   Medication Refill   Foot Pain       Subjective:   Richard [name] is a 57 y.o. male here today for continued foot pain.  His R foot has a h/o injury and neuropathy.  This has been ongoing. He has seen podiatry but has not seen them in a long time.  he had called in for foot pain and swelling. He has been out of lasix  He also says he never saw the specialist for the pain in his cervical and thoracic spine.  No UE weakness.  No paresthesias.  He has not drank alcohol since November of last year but he is using THC for pain.    He denies any abdominal pain or swelling.    No problems updated.  ALLERGIES: Allergies  Allergen Reactions   Aspirin Other (See Comments)    Caused acid reflux   Penicillins Hives    Has patient had a PCN reaction causing immediate rash, facial/tongue/throat swelling, SOB or lightheadedness with hypotension: yes Has patient had a PCN reaction causing severe rash involving mucus membranes or skin necrosis: no Has patient had a PCN reaction that required hospitalization: no Has patient had a PCN reaction occurring within the last 10 years: no If all of the above answers are "NO", then may proceed with Cephalosporin use.     PAST MEDICAL HISTORY: Past Medical History:  Diagnosis Date   Abnormal nuclear cardiac imaging test    Acid reflux    Acute pancreatitis 08/14/2018   Alcohol withdrawal delirium (HCC) 08/20/2016   Alcoholic cardiomyopathy (HCC) 25/42/7062   Alcoholic hepatitis    Alcoholic ketoacidosis 11/13/2017   Ascites 12/13/2019   Aspiration pneumonia (HCC) 08/20/2016   Chest pain of uncertain etiology    Chronic systolic CHF (congestive heart failure) (HCC)    Cirrhosis (HCC)    Colon cancer (HCC)    DCM (dilated cardiomyopathy) (HCC)     Drug abuse (HCC) 01/07/2020   Elevated troponin 06/27/2018   ETOH abuse    Gastropathy 08/14/2018   Heme positive stool 11/13/2017   Hepatic steatosis 08/21/2016   High anion gap metabolic acidosis 11/05/2018   History of colon cancer    HTN (hypertension)    Hypertensive urgency 06/27/2018   Hypoglycemia 06/27/2018   Hypokalemia 01/07/2020   Hypomagnesemia    Hypophosphatemia    Lactic acidosis 08/20/2016   Leukocytosis 01/07/2020   Neuropathy    Pancreatitis 08/2018   Polyp of ascending colon    Prolonged Q-T interval on ECG    Prolonged QT interval    Protein-calorie malnutrition, severe 05/02/2018   PUD (peptic ulcer disease)    SBP (spontaneous bacterial peritonitis) (HCC) 01/07/2020   Sepsis (HCC) 08/20/2016   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 Authorizing Provider  ferrous gluconate (FERGON) 324 MG tablet Take 1 tablet (324 mg total) by mouth 2 (two) times daily with a meal. 11/21/22  Yes Osvaldo Shipper, MD  folic acid (FOLVITE) 1 MG tablet Take 1 tablet (1 mg total) by mouth daily. 10/02/22  Yes Glade Lloyd, MD  magnesium chloride (SLOW-MAG) 64 MG TBEC  SR tablet Take 2 tablets (128 mg total) by mouth daily. 10/08/22  Yes Blue, Soijett A, PA-C  methocarbamol (ROBAXIN) 500 MG tablet Take 1 tablet (500 mg total) by mouth every 8 (eight) hours as needed for muscle spasms. 05/10/23  Yes , Marzella Schlein, PA-C  naloxone [facility]) nasal spray 4 mg/0.1 mL Place 1 spray into the nose as needed (accidental overdose). 07/06/22  Yes [provider]  oxyCODONE-acetaminophen (PERCOCET) 10-325 MG tablet Take 1 tablet by mouth 4 (four) times daily as needed for pain. 08/26/22  Yes [provider]  pantoprazole (PROTONIX) 40 MG tablet Take 1 tablet (40 mg total) by mouth daily. 03/07/23  Yes Hoy Register, MD  potassium chloride SA (KLOR-CON M) 20 MEQ tablet Take 1 tablet (20 mEq total) by mouth daily. 02/07/23  Yes Hoy Register, MD  propranolol (INDERAL) 20 MG tablet Take 1 tablet (20 mg total) by mouth 2 (two) times daily. 11/21/22  Yes Osvaldo Shipper, MD  thiamine 100 MG tablet Take 1 tablet (100 mg total) by mouth daily. 11/06/21  Yes Erick Blinks, MD  furosemide (LASIX) 40 MG tablet Take 1 tablet (40 mg total) by mouth daily. 05/10/23   Anders Simmonds, PA-C  gabapentin (NEURONTIN) 100 MG capsule Take 1 capsule (100 mg total) by mouth 3 (three) times daily. 05/10/23   Anders Simmonds, PA-C  lactulose (CHRONULAC) 10 GM/15ML solution Take 30 mLs (20 g total) by mouth 2 (two) times daily. 05/10/23   Anders Simmonds, PA-C  spironolactone (ALDACTONE) 100 MG tablet Take 1 tablet (100 mg total) by mouth daily. 05/10/23 08/08/23  Anders Simmonds, PA-C    ROS: Neg HEENT Neg resp Neg cardiac Neg GI Neg GU Neg psych Neg neuro  Objective:   Vitals:   05/10/23 1346  BP: 118/78  Pulse: 85  SpO2: 97%  Weight: 136 lb (61.7 kg)   Exam General appearance : Awake, alert, not in any distress. Speech mumbles-I ha. Not toxic looking HEENT: Atraumatic and Normocephalic Neck: Supple, no JVD. No cervical lymphadenopathy.  Chest: Good air entry bilaterally, CTAB.  No rales/rhonchi/wheezing CVS: S1 S2 regular, no murmurs.  TTP along paraspinus muscles in the the cervico/thoracic junction.  BUE DTR=intact B R foot-no swelling currently.  DP pulses=B.  Scar on proximal dorsum of foot.  Good ROM.  No pint TTP.  N-V intact Extremities: B/L Lower Ext shows no edema, both legs are warm to touch Neurology: Awake alert, and oriented X 3, CN II-XII intact, Non focal Skin: No Rash  Data Review Lab Results  Component Value Date   HGBA1C <4.2 (L) 09/24/2022   HGBA1C 4.6 11/06/2020   HGBA1C <4.2 (L) 01/11/2020    Assessment & Plan   1. Alcoholic cirrhosis of liver with ascites (HCC) - Gamma  GT - Ammonia  2. Hyponatremia - Comprehensive metabolic panel - CBC with Differential/Platelet  3. Alcoholic hepatitis with ascites No longer drinking!! Congratulated and encouraged - Comprehensive metabolic panel - CBC with Differential/Platelet - spironolactone (ALDACTONE) 100 MG tablet; Take 1 tablet (100 mg total) by mouth daily.  Dispense: 30 tablet; Refill: 2 - furosemide (LASIX) 40 MG tablet; Take 1 tablet (40 mg total) by mouth daily.  Dispense: 90 tablet; Refill: 0 - lactulose (CHRONULAC) 10 GM/15ML solution; Take 30 mLs (20 g total) by mouth 2 (two) times daily.  Dispense: 1800 mL; Refill: 2  4. Right foot pain - methocarbamol (ROBAXIN) 500 MG tablet; Take 1 tablet (500 mg total) by mouth every 8 (  eight) hours as needed for muscle spasms.  Dispense: 60 tablet; Refill: 1 - Ambulatory referral to Podiatry - Ambulatory referral to Orthopedic Surgery  5. Cervical stenosis of spine - methocarbamol (ROBAXIN) 500 MG tablet; Take 1 tablet (500 mg total) by mouth every 8 (eight) hours as needed for muscle spasms.  Dispense: 60 tablet; Refill: 1 - Ambulatory referral to Orthopedic Surgery  6.  Poor historian  Return in about 3 months (around 08/10/2023) for for chronic conditions.  The patient was given clear instructions to go to ER or return to medical center if symptoms don't improve, worsen or new problems develop. The patient verbalized understanding. The patient was told to call to get lab results if they haven't heard anything in the next week.      Georgian Co, PA-C [facility] [place], [place] [phone]   05/10/2023, 2:01 PM

## 2023-05-11 ENCOUNTER — Other Ambulatory Visit: Payer: Self-pay | Admitting: Physician Assistant

## 2023-05-11 DIAGNOSIS — D638 Anemia in other chronic diseases classified elsewhere: Secondary | ICD-10-CM

## 2023-05-11 MED ORDER — FERROUS GLUCONATE 324 (38 FE) MG PO TABS
324.0000 mg | ORAL_TABLET | Freq: Two times a day (BID) | ORAL | 3 refills | Status: DC
Start: 2023-05-11 — End: 2023-07-19

## 2023-05-16 ENCOUNTER — Telehealth: Payer: Self-pay

## 2023-05-16 ENCOUNTER — Telehealth: Payer: Self-pay | Admitting: *Deleted

## 2023-05-16 NOTE — Telephone Encounter (Signed)
Pt given lab results per notes of A. McClung, PA from 05/11/23 on 05/16/23. Pt verbalized understanding to take iron and to contact GI for f/u. Patient requesting if PCP  can send fax "paperwork" to [facility] insurance because he is eligible for a program to receive assistance with medical care. Recommended patient f/u at next appt with PCP on 05/29/23.

## 2023-05-16 NOTE — Telephone Encounter (Signed)
Pt was called and vm was left, Information has been sent to nurse pool.   

## 2023-05-16 NOTE — Telephone Encounter (Signed)
-----   Message from Georgian Co sent at 05/11/2023  4:48 PM EDT ----- Resume your iron intake as your hemoglobin is low.  This can make you very tired and feel bad.  Iron will helps.  Your ammonia is elevated so make sure you are taking your lactulose as directed and follow up with your gastroenterologist.  Your alkaline phosphatase is elevated which can indicate liver/digestive issues.  It is very important to follow up with gastroenterology.  Thanks, Georgian Co, PA-C

## 2023-05-16 NOTE — Telephone Encounter (Signed)
Attempted to review lab results with patient from 05/11/23 from A. Sharon Seller, PA. Unable to complete message due to patient phone disconnected. Attempted to contact [phone] and patient did not answer, LVMTCB [phone].

## 2023-05-29 ENCOUNTER — Ambulatory Visit: Payer: Medicaid Other | Attending: Family Medicine | Admitting: Family Medicine

## 2023-05-29 VITALS — BP 110/71 | HR 89 | Ht 72.0 in | Wt 138.0 lb

## 2023-05-29 DIAGNOSIS — K7031 Alcoholic cirrhosis of liver with ascites: Secondary | ICD-10-CM

## 2023-05-29 DIAGNOSIS — M79671 Pain in right foot: Secondary | ICD-10-CM

## 2023-05-29 DIAGNOSIS — G621 Alcoholic polyneuropathy: Secondary | ICD-10-CM | POA: Diagnosis not present

## 2023-05-29 DIAGNOSIS — G8929 Other chronic pain: Secondary | ICD-10-CM | POA: Diagnosis not present

## 2023-05-29 DIAGNOSIS — G4700 Insomnia, unspecified: Secondary | ICD-10-CM | POA: Diagnosis not present

## 2023-05-29 DIAGNOSIS — M79672 Pain in left foot: Secondary | ICD-10-CM | POA: Diagnosis not present

## 2023-05-29 DIAGNOSIS — R600 Localized edema: Secondary | ICD-10-CM

## 2023-05-29 MED ORDER — MIRTAZAPINE 15 MG PO TABS: 15.0000 mg | ORAL_TABLET | Freq: Every day | ORAL | 1 refills | Status: DC

## 2023-05-29 MED ORDER — GABAPENTIN 300 MG PO CAPS: 300.0000 mg | ORAL_CAPSULE | Freq: Three times a day (TID) | ORAL | 3 refills | Status: DC

## 2023-05-29 NOTE — Progress Notes (Signed)
Subjective:  Patient ID: Richard [name], male    DOB: [birth year]  Age: 57 y.o. MRN: [id number]  CC: Medical Management of Chronic Issues (Concern with foot swelling. Can't sleep at night. And needs something for pain.)   HPI Desean Hubbert is a 57 y.o. year old male with a history of peptic ulcer disease, alcoholic liver cirrhosis, GERD, colon cancer (status post partial colectomy) SVT, dilated cardiomyopathy (EF 60-65 %), Depression, left eye blindness.   Interval History: Discussed the use of AI scribe software for clinical note transcription with the patient, who gave verbal consent to proceed.  He presents with chronic pain and swelling in both feet. The pain is described as intense and constant, causing significant discomfort and difficulty sleeping. The patient reports that the pain feels like walking on cushions or pus under the feet. The patient has been taking gabapentin for the neuropathy, but reports that it is not effectively managing the pain.  The patient expresses frustration with the current pain management plan and requests stronger pain medication, specifically hydrocodone. The patient also reports difficulty sleeping due to the pain. Last uric acid in 11/2022 was negative for gout.  The patient has a history of alcohol abuse and has been sober for almost a year. Despite this, the patient continues to experience symptoms of alcoholic peripheral neuropathy. The patient is also not taking all prescribed medications for liver cirrhosis upon review of his med list.  He does not have spironolactone but endorses taking furosemide.      Past Medical History:  Diagnosis Date   Abnormal nuclear cardiac imaging test    Acid reflux    Acute pancreatitis 08/14/2018   Alcohol withdrawal delirium (HCC) 08/20/2016   Alcoholic cardiomyopathy (HCC) 84/13/2440   Alcoholic hepatitis    Alcoholic ketoacidosis 11/13/2017   Ascites 12/13/2019   Aspiration pneumonia (HCC)  08/20/2016   Chest pain of uncertain etiology    Chronic systolic CHF (congestive heart failure) (HCC)    Cirrhosis (HCC)    Colon cancer (HCC)    DCM (dilated cardiomyopathy) (HCC)    Drug abuse (HCC) 01/07/2020   Elevated troponin 06/27/2018   ETOH abuse    Gastropathy 08/14/2018   Heme positive stool 11/13/2017   Hepatic steatosis 08/21/2016   High anion gap metabolic acidosis 11/05/2018   History of colon cancer    HTN (hypertension)    Hypertensive urgency 06/27/2018   Hypoglycemia 06/27/2018   Hypokalemia 01/07/2020   Hypomagnesemia    Hypophosphatemia    Lactic acidosis 08/20/2016   Leukocytosis 01/07/2020   Neuropathy    Pancreatitis 08/2018   Polyp of ascending colon    Prolonged Q-T interval on ECG    Prolonged QT interval    Protein-calorie malnutrition, severe 05/02/2018   PUD (peptic ulcer disease)    SBP (spontaneous bacterial peritonitis) (HCC) 01/07/2020   Sepsis (HCC) 08/20/2016   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   SVT (supraventricular tachycardia)    Thrombocytopenia (HCC) 08/21/2016    Past Surgical History:  Procedure Laterality Date   BIOPSY  12/14/2019   Procedure: BIOPSY;  Surgeon: Napoleon Form, MD;  Location: [facility] ENDOSCOPY;  Service: Endoscopy;;   BIOPSY  11/19/2022   Procedure: BIOPSY;  Surgeon: Jenel Lucks, MD;  Location: [facility] ENDOSCOPY;  Service: Gastroenterology;;   catherization  2007   COLONOSCOPY WITH PROPOFOL N/A 12/14/2019   Procedure: COLONOSCOPY WITH PROPOFOL;  Surgeon: Napoleon Form, MD;  Location: [facility] ENDOSCOPY;  Service: Endoscopy;  Laterality: N/A;  ESOPHAGEAL BANDING  11/19/2022   Procedure: ESOPHAGEAL BANDING;  Surgeon: Jenel Lucks, MD;  Location: [facility] ENDOSCOPY;  Service: Gastroenterology;;   ESOPHAGOGASTRODUODENOSCOPY (EGD) WITH PROPOFOL N/A 12/14/2019   Procedure: ESOPHAGOGASTRODUODENOSCOPY (EGD) WITH PROPOFOL;  Surgeon: Napoleon Form, MD;  Location: [facility] ENDOSCOPY;  Service: Endoscopy;  Laterality:  N/A;   ESOPHAGOGASTRODUODENOSCOPY (EGD) WITH PROPOFOL N/A 11/19/2022   Procedure: ESOPHAGOGASTRODUODENOSCOPY (EGD) WITH PROPOFOL;  Surgeon: Jenel Lucks, MD;  Location: [facility] ENDOSCOPY;  Service: Gastroenterology;  Laterality: N/A;   HERNIA REPAIR  1969   1 x at birth and at 57 years old   LAPAROSCOPIC SIGMOID COLECTOMY  2007   OPEN REDUCTION INTERNAL FIXATION (ORIF) HAND Right 2012   3rd  digit   POLYPECTOMY  12/14/2019   Procedure: POLYPECTOMY;  Surgeon: Napoleon Form, MD;  Location: [facility] ENDOSCOPY;  Service: Endoscopy;;    Family History  Problem Relation Age of Onset   Hypertension Mother    Colon cancer Father    Cancer Sister        type unknown   Kidney disease Sister        dialysis   Colon cancer Cousin        x 2   CAD Neg Hx    Stroke Neg Hx    Diabetes Neg Hx    Stomach cancer Neg Hx    Esophageal cancer Neg Hx    Pancreatic cancer Neg Hx    Colon polyps Neg Hx     Social History   Socioeconomic History   Marital status: Divorced    Spouse name: Not on file   Number of children: Not on file   Years of education: Not on file   Highest education level: Not on file  Occupational History   Not on file  Tobacco Use   Smoking status: Every Day    Current packs/day: 1.00    Types: Cigarettes   Smokeless tobacco: Never  Vaping Use   Vaping status: Never Used  Substance and Sexual Activity   Alcohol use: Not Currently    Alcohol/week: 2.0 standard drinks of alcohol    Types: 1 Cans of beer, 1 Shots of liquor per week    Comment: last drink in February   Drug use: Yes    Types: Marijuana   Sexual activity: Not Currently  Other Topics Concern   Not on file  Social History Narrative   Not on file   Social Determinants of Health   Financial Resource Strain: High Risk (06/03/2020)   Overall Financial Resource Strain (CARDIA)    Difficulty of Paying Living Expenses: Very hard  Food Insecurity: Food Insecurity Present (11/17/2022)   Hunger Vital Sign     Worried About Running Out of Food in the Last Year: Sometimes true    Ran Out of Food in the Last Year: Sometimes true  Transportation Needs: No Transportation Needs (11/17/2022)   PRAPARE - Administrator, Civil Service (Medical): No    Lack of Transportation (Non-Medical): No  Physical Activity: Not on file  Stress: Not on file  Social Connections: Unknown (02/14/2022)   Received from [facility]   Social Network    Social Network: Not on file    Allergies  Allergen Reactions   Aspirin Other (See Comments)    Caused acid reflux   Penicillins Hives    Has patient had a PCN reaction causing immediate rash, facial/tongue/throat swelling, SOB or lightheadedness with hypotension: yes Has patient had a PCN  reaction causing severe rash involving mucus membranes or skin necrosis: no Has patient had a PCN reaction that required hospitalization: no Has patient had a PCN reaction occurring within the last 10 years: no If all of the above answers are "NO", then may proceed with Cephalosporin use.     Outpatient Medications Prior to Visit  Medication Sig Dispense Refill   furosemide (LASIX) 40 MG tablet Take 1 tablet (40 mg total) by mouth daily. 90 tablet 0   lactulose (CHRONULAC) 10 GM/15ML solution Take 30 mLs (20 g total) by mouth 2 (two) times daily. 1800 mL 2   gabapentin (NEURONTIN) 100 MG capsule Take 1 capsule (100 mg total) by mouth 3 (three) times daily. 90 capsule 1   ferrous gluconate (FERGON) 324 MG tablet Take 1 tablet (324 mg total) by mouth 2 (two) times daily with a meal. (Patient not taking: Reported on 05/29/2023) 60 tablet 3   folic acid (FOLVITE) 1 MG tablet Take 1 tablet (1 mg total) by mouth daily. (Patient not taking: Reported on 05/29/2023) 30 tablet 0   methocarbamol (ROBAXIN) 500 MG tablet Take 1 tablet (500 mg total) by mouth every 8 (eight) hours as needed for muscle spasms. (Patient not taking: Reported on 05/29/2023) 60 tablet 1   naloxone (NARCAN)  nasal spray 4 mg/0.1 mL Place 1 spray into the nose as needed (accidental overdose). (Patient not taking: Reported on 05/29/2023)     pantoprazole (PROTONIX) 40 MG tablet Take 1 tablet (40 mg total) by mouth daily. (Patient not taking: Reported on 05/29/2023) 90 tablet 0   potassium chloride SA (KLOR-CON M) 20 MEQ tablet Take 1 tablet (20 mEq total) by mouth daily. (Patient not taking: Reported on 05/29/2023) 90 tablet 1   propranolol (INDERAL) 20 MG tablet Take 1 tablet (20 mg total) by mouth 2 (two) times daily. (Patient not taking: Reported on 05/29/2023) 60 tablet 2   spironolactone (ALDACTONE) 100 MG tablet Take 1 tablet (100 mg total) by mouth daily. (Patient not taking: Reported on 05/29/2023) 30 tablet 2   thiamine 100 MG tablet Take 1 tablet (100 mg total) by mouth daily. (Patient not taking: Reported on 05/29/2023) 30 tablet 1   magnesium chloride (SLOW-MAG) 64 MG TBEC SR tablet Take 2 tablets (128 mg total) by mouth daily. (Patient not taking: Reported on 05/29/2023) 60 tablet 0   oxyCODONE-acetaminophen (PERCOCET) 10-325 MG tablet Take 1 tablet by mouth 4 (four) times daily as needed for pain. (Patient not taking: Reported on 05/29/2023)     No facility-administered medications prior to visit.     ROS Review of Systems  Constitutional:  Negative for activity change and appetite change.  HENT:  Negative for sinus pressure and sore throat.   Respiratory:  Negative for chest tightness, shortness of breath and wheezing.   Cardiovascular:  Positive for leg swelling. Negative for chest pain and palpitations.  Gastrointestinal:  Negative for abdominal distention, abdominal pain and constipation.  Genitourinary: Negative.   Musculoskeletal: Negative.   Psychiatric/Behavioral:  Positive for sleep disturbance. Negative for behavioral problems and dysphoric mood.     Objective:  BP 110/71 (BP Location: Right Arm, Patient Position: Sitting, Cuff Size: Normal)   Pulse 89   Ht 6' (1.829 m)   Wt  138 lb (62.6 kg)   SpO2 98%   BMI 18.72 kg/m      05/29/2023    3:16 PM 05/10/2023    1:46 PM 11/21/2022    9:46 AM  BP/Weight  Systolic BP 110  118 101  Diastolic BP 71 78 51  Wt. (Lbs) 138 136   BMI 18.72 kg/m2 18.44 kg/m2       Physical Exam Constitutional:      Appearance: He is well-developed.  Cardiovascular:     Rate and Rhythm: Normal rate.     Heart sounds: Normal heart sounds. No murmur heard. Pulmonary:     Effort: Pulmonary effort is normal.     Breath sounds: Normal breath sounds. No wheezing or rales.  Chest:     Chest wall: No tenderness.  Abdominal:     General: Bowel sounds are normal. There is no distension.     Palpations: Abdomen is soft. There is no mass.     Tenderness: There is no abdominal tenderness.  Musculoskeletal:        General: Normal range of motion.     Right lower leg: No edema.     Left lower leg: No edema.  Neurological:     Mental Status: He is alert and oriented to person, place, and time.  Psychiatric:        Mood and Affect: Mood normal.        Latest Ref Rng & Units 05/10/2023    2:17 PM 11/21/2022    3:25 AM 11/20/2022    2:47 AM  CMP  Glucose 70 - 99 mg/dL 811  99  914   BUN 6 - 24 mg/dL 6  5  <5   Creatinine 7.82 - 1.27 mg/dL 9.56  2.13  0.86   Sodium 134 - 144 mmol/L 139  131  133   Potassium 3.5 - 5.2 mmol/L 3.5  3.6  4.0   Chloride 96 - 106 mmol/L 104  100  106   CO2 20 - 29 mmol/L 21  21  20    Calcium 8.7 - 10.2 mg/dL 9.3  8.6  8.3   Total Protein 6.0 - 8.5 g/dL 6.8     Total Bilirubin 0.0 - 1.2 mg/dL 1.8     Alkaline Phos 44 - 121 IU/L 217     AST 0 - 40 IU/L 29     ALT 0 - 44 IU/L 14       Lipid Panel     Component Value Date/Time   CHOL 156 06/24/2022 0900   TRIG 58 06/24/2022 0900   HDL 77 06/24/2022 0900   CHOLHDL 2.0 06/24/2022 0900   CHOLHDL NOT CALCULATED 01/11/2020 0640   VLDL 30 01/11/2020 0640   LDLCALC 67 06/24/2022 0900    CBC    Component Value Date/Time   WBC 3.6 05/10/2023 1417    WBC 5.6 11/21/2022 0325   RBC 3.84 (L) 05/10/2023 1417   RBC 3.15 (L) 11/21/2022 0325   HGB 7.7 (L) 05/10/2023 1417   HGB 15.6 08/31/2007 0955   HCT 23.9 (L) 05/10/2023 1417   HCT 44.5 08/31/2007 0955   PLT 138 (L) 05/10/2023 1417   MCV 62 (L) 05/10/2023 1417   MCV 97.2 08/31/2007 0955   MCH 20.1 (L) 05/10/2023 1417   MCH 26.0 11/21/2022 0325   MCHC 32.2 05/10/2023 1417   MCHC 31.3 11/21/2022 0325   RDW 18.0 (H) 05/10/2023 1417   RDW 13.0 08/31/2007 0955   LYMPHSABS 1.4 05/10/2023 1417   LYMPHSABS 1.6 08/31/2007 0955   MONOABS 0.9 11/16/2022 1642   MONOABS 0.6 08/31/2007 0955   EOSABS 0.1 05/10/2023 1417   BASOSABS 0.1 05/10/2023 1417   BASOSABS 0.0 08/31/2007 0955    Lab Results  Component Value Date   HGBA1C <4.2 (L) 09/24/2022    Assessment & Plan:      Alcoholic Peripheral Neuropathy Severe bilateral foot pain and swelling. Gabapentin 100mg  not providing sufficient relief.  -Increase Gabapentin to 300mg  TID. -Robaxin was also prescribed at his last visit -Encourage compliance with spironolactone and furosemide for edema management. -Refer to pain clinic for further pain management options.  Insomnia Difficulty falling asleep, possibly related to pain. -Prescribe Remeron to aid with sleep, increase appetite, and promote weight gain.  Liver Cirrhosis Non-compliance with spironolactone and furosemide contributing to edema. -Edema is not demonstrated on my exam today. -Encourage compliance with spironolactone and furosemide for management of cirrhosis-related edema.  General Health Maintenance -Ensure patient has all prescribed medications by comparing medication list with patient's current medication bottles. -If any medications are missing, instruct patient to pick them up from the pharmacy.          Meds ordered this encounter  Medications   gabapentin (NEURONTIN) 300 MG capsule    Sig: Take 1 capsule (300 mg total) by mouth 3 (three) times daily.     Dispense:  90 capsule    Refill:  3    Dose increase   mirtazapine (REMERON) 15 MG tablet    Sig: Take 1 tablet (15 mg total) by mouth at bedtime.    Dispense:  90 tablet    Refill:  1    Follow-up: Return in about 6 months (around 11/29/2023) for Chronic medical conditions.       Hoy Register, MD, FAAFP. [facility] [place], [place] [phone]   05/29/2023, 4:30 PM

## 2023-05-29 NOTE — Patient Instructions (Signed)
Neuropathic Pain Neuropathic pain is pain caused by damage to the nerves that are responsible for certain sensations in your body (sensory nerves). Neuropathic pain can make you more sensitive to pain. Even a minor sensation can feel very painful. This is usually a long-term (chronic) condition that can be difficult to treat. The type of pain differs from person to person. It may: Start suddenly (acute), or it may develop slowly and become chronic. Come and go as damaged nerves heal, or it may stay at the same level for years. Cause emotional distress, loss of sleep, and a lower quality of life. What are the causes? The most common cause of this condition is diabetes. Many other diseases and conditions can also cause neuropathic pain. Causes of neuropathic pain can be classified as: Toxic. This is caused by medicines and chemicals. The most common causes of toxic neuropathic pain is damage from medicines that kill cancer cells (chemotherapy) or alcohol abuse. Metabolic. This can be caused by: Diabetes. Lack of vitamins like B12. Traumatic. Any injury that cuts, crushes, or stretches a nerve can cause damage and pain. Compression-related. If a sensory nerve gets trapped or compressed for a long period of time, the blood supply to the nerve can be cut off. Vascular. Many blood vessel diseases can cause neuropathic pain by decreasing blood supply and oxygen to nerves. Autoimmune. This type of pain results from diseases in which the body's defense system (immune system) mistakenly attacks sensory nerves. Examples of autoimmune diseases that can cause neuropathic pain include lupus and multiple sclerosis. Infectious. Many types of viral infections can damage sensory nerves and cause pain. Shingles infection is a common cause of this type of pain. Inherited. Neuropathic pain can be a symptom of many diseases that are passed down through families (genetic). What increases the risk? You are more likely to  develop this condition if: You have diabetes. You smoke. You drink too much alcohol. You are taking certain medicines, including chemotherapy or medicines that treat immune system disorders. What are the signs or symptoms? The main symptom is pain. Neuropathic pain is often described as: Burning. Shock-like. Stinging. Hot or cold. Itching. How is this diagnosed? No single test can diagnose neuropathic pain. It is diagnosed based on: A physical exam and your symptoms. Your health care provider will ask you about your pain. You may be asked to use a pain scale to describe how bad your pain is. Tests. These may be done to see if you have a cause and location of any nerve damage. They include: Nerve conduction studies and electromyography to test how well nerve signals travel through your nerves and muscles (electrodiagnostic testing). Skin biopsy to evaluate for small fiber neuropathy. Imaging studies, such as: X-rays. CT scan. MRI. How is this treated? Treatment for neuropathic pain may change over time. You may need to try different treatment options or a combination of treatments. Some options include: Treating the underlying cause of the neuropathy, such as diabetes, kidney disease, or vitamin deficiencies. Stopping medicines that can cause neuropathy, such as chemotherapy. Medicine to relieve pain. Medicines may include: Prescription or over-the-counter pain medicine. Anti-seizure medicine. Antidepressant medicines. Pain-relieving patches or creams that are applied to painful areas of skin. A medicine to numb the area (local anesthetic), which can be injected as a nerve block. Transcutaneous nerve stimulation. This uses electrical currents to block painful nerve signals. The treatment is painless. Alternative treatments, such as: Acupuncture. Meditation. Massage. Occupational or physical therapy. Pain management programs. Counseling. Follow   these instructions at  home: Medicines  Take over-the-counter and prescription medicines only as told by your health care provider. Ask your health care provider if the medicine prescribed to you: Requires you to avoid driving or using machinery. Can cause constipation. You may need to take these actions to prevent or treat constipation: Drink enough fluid to keep your urine pale yellow. Take over-the-counter or prescription medicines. Eat foods that are high in fiber, such as beans, whole grains, and fresh fruits and vegetables. Limit foods that are high in fat and processed sugars, such as fried or sweet foods. Lifestyle  Have a good support system at home. Consider joining a chronic pain support group. Do not use any products that contain nicotine or tobacco. These products include cigarettes, chewing tobacco, and vaping devices, such as e-cigarettes. If you need help quitting, ask your health care provider. Do not drink alcohol. General instructions Learn as much as you can about your condition. Work closely with all your health care providers to find the treatment plan that works best for you. Ask your health care provider what activities are safe for you. Keep all follow-up visits. This is important. Contact a health care provider if: Your pain treatments are not working. You are having side effects from your medicines. You are struggling with tiredness (fatigue), mood changes, depression, or anxiety. Get help right away if: You have thoughts of hurting yourself. Get help right away if you feel like you may hurt yourself or others, or have thoughts about taking your own life. Go to your nearest emergency room or: Call 911. Call the National Suicide Prevention Lifeline at 1-800-273-8255 or 988. This is open 24 hours a day. Text the Crisis Text Line at 741741. Summary Neuropathic pain is pain caused by damage to the nerves that are responsible for certain sensations in your body (sensory  nerves). Neuropathic pain may come and go as damaged nerves heal, or it may stay at the same level for years. Neuropathic pain is usually a long-term condition that can be difficult to treat. Consider joining a chronic pain support group. This information is not intended to replace advice given to you by your health care provider. Make sure you discuss any questions you have with your health care provider. Document Revised: 05/17/2021 Document Reviewed: 05/17/2021 Elsevier Patient Education  2024 [employer]  

## 2023-05-30 ENCOUNTER — Telehealth: Payer: Self-pay | Admitting: Family Medicine

## 2023-06-01 NOTE — Telephone Encounter (Signed)
error 

## 2023-06-02 ENCOUNTER — Ambulatory Visit (INDEPENDENT_AMBULATORY_CARE_PROVIDER_SITE_OTHER): Payer: Medicaid Other | Admitting: Podiatry

## 2023-06-02 DIAGNOSIS — M778 Other enthesopathies, not elsewhere classified: Secondary | ICD-10-CM

## 2023-06-02 NOTE — Progress Notes (Signed)
No show for apt.

## 2023-06-08 ENCOUNTER — Other Ambulatory Visit: Payer: Self-pay | Admitting: Family Medicine

## 2023-06-08 DIAGNOSIS — K7011 Alcoholic hepatitis with ascites: Secondary | ICD-10-CM

## 2023-06-12 ENCOUNTER — Telehealth: Payer: Self-pay

## 2023-06-12 NOTE — Telephone Encounter (Signed)
..   Medicaid Managed Care   Unsuccessful Outreach Note  06/12/2023 Name: Brogen Walsh MRN: [id number] DOB: 1966/05/20  Referred by: Hoy Register, MD Reason for referral : Appointment (I called the patient today to get him scheduled with our MM Team for assistance with food and utilities. He did not answer but I was able to leave a message on his voicemail.)   An unsuccessful telephone outreach was attempted today. The patient was referred to the case management team for assistance with care management and care coordination.   Follow Up Plan: A HIPAA compliant phone message was left for the patient providing contact information and requesting a return call.  The care management team will reach out to the patient again over the next 5 days.   Weston Settle Care Guide  [facility] Managed  Care Guide [facility]  [phone]

## 2023-06-14 ENCOUNTER — Telehealth: Payer: Self-pay

## 2023-06-14 NOTE — Telephone Encounter (Signed)
..   Medicaid Managed Care   Unsuccessful Outreach Note  06/14/2023 Name: Richard [name] MRN: [id number] DOB: 07/16/1966  Referred by: Hoy Register, MD Reason for referral : Appointment   A second unsuccessful telephone outreach was attempted today. The patient was referred to the case management team for assistance with care management and care coordination.   Follow Up Plan: A HIPAA compliant phone message was left for the patient providing contact information and requesting a return call.  The care management team will reach out to the patient again over the next 7 days.   Weston Settle Care Guide  [facility] Managed  Care Guide [facility]  [phone]

## 2023-06-19 ENCOUNTER — Telehealth: Payer: Self-pay

## 2023-06-19 NOTE — Telephone Encounter (Signed)
...  Medicaid Managed Care   Unsuccessful Outreach Note  06/19/2023 Name: Richard [name] MRN: [id number] DOB: 1966-06-29  Referred by: Hoy Register, MD Reason for referral : Appointment (I made a third attempt to reach the patient today regarding a referral from his health plan. I called both numbers in his chart and left messages.)   Third unsuccessful telephone outreach was attempted today. The patient was referred to the case management team for assistance with care management and care coordination. The patient's primary care provider has been notified of our unsuccessful attempts to make or maintain contact with the patient. The care management team is pleased to engage with this patient at any time in the future should he/she be interested in assistance from the care management team.   Follow Up Plan: We have been unable to make contact with the patient for follow up. The care management team is available to follow up with the patient after provider conversation with the patient regarding recommendation for care management engagement and subsequent re-referral to the care management team.   Weston Settle Care Guide  [facility] Managed  Care Guide [facility]  [phone]

## 2023-06-21 ENCOUNTER — Ambulatory Visit: Payer: Medicaid Other | Admitting: Podiatry

## 2023-06-21 DIAGNOSIS — G621 Alcoholic polyneuropathy: Secondary | ICD-10-CM

## 2023-06-21 DIAGNOSIS — M7751 Other enthesopathy of right foot: Secondary | ICD-10-CM

## 2023-06-21 DIAGNOSIS — G609 Hereditary and idiopathic neuropathy, unspecified: Secondary | ICD-10-CM

## 2023-06-21 MED ORDER — BETAMETHASONE SOD PHOS & ACET 6 (3-3) MG/ML IJ SUSP
3.0000 mg | Freq: Once | INTRAMUSCULAR | Status: AC
Start: 2023-06-21 — End: 2023-06-21
  Administered 2023-06-21: 3 mg via INTRA_ARTICULAR

## 2023-06-21 NOTE — Progress Notes (Signed)
Chief Complaint  Patient presents with   Foot Pain    bilateral foot pain, burning sensation and swelling, right over left right foot is very painful       Subjective:  57 y.o. male PMHx alcohol induced peripheral polyneuropathy presenting today for follow-up evaluation of bilateral foot pain and ankle pain.  Patient states that he is not able to walk due to the foot and ankle pain.    Past Medical History:  Diagnosis Date   Abnormal nuclear cardiac imaging test    Acid reflux    Acute pancreatitis 08/14/2018   Alcohol withdrawal delirium (HCC) 08/20/2016   Alcoholic cardiomyopathy (HCC) 16/07/9603   Alcoholic hepatitis    Alcoholic ketoacidosis 11/13/2017   Ascites 12/13/2019   Aspiration pneumonia (HCC) 08/20/2016   Chest pain of uncertain etiology    Chronic systolic CHF (congestive heart failure) (HCC)    Cirrhosis (HCC)    Colon cancer (HCC)    DCM (dilated cardiomyopathy) (HCC)    Drug abuse (HCC) 01/07/2020   Elevated troponin 06/27/2018   ETOH abuse    Gastropathy 08/14/2018   Heme positive stool 11/13/2017   Hepatic steatosis 08/21/2016   High anion gap metabolic acidosis 11/05/2018   History of colon cancer    HTN (hypertension)    Hypertensive urgency 06/27/2018   Hypoglycemia 06/27/2018   Hypokalemia 01/07/2020   Hypomagnesemia    Hypophosphatemia    Lactic acidosis 08/20/2016   Leukocytosis 01/07/2020   Neuropathy    Pancreatitis 08/2018   Polyp of ascending colon    Prolonged Q-T interval on ECG    Prolonged QT interval    Protein-calorie malnutrition, severe 05/02/2018   PUD (peptic ulcer disease)    SBP (spontaneous bacterial peritonitis) (HCC) 01/07/2020   Sepsis (HCC) 08/20/2016   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   SVT (supraventricular tachycardia)    Thrombocytopenia (HCC) 08/21/2016    Past Surgical History:  Procedure Laterality Date   BIOPSY  12/14/2019   Procedure: BIOPSY;  Surgeon: Napoleon Form, MD;  Location: [facility]  ENDOSCOPY;  Service: Endoscopy;;   BIOPSY  11/19/2022   Procedure: BIOPSY;  Surgeon: Jenel Lucks, MD;  Location: [facility] ENDOSCOPY;  Service: Gastroenterology;;   catherization  2007   COLONOSCOPY WITH PROPOFOL N/A 12/14/2019   Procedure: COLONOSCOPY WITH PROPOFOL;  Surgeon: Napoleon Form, MD;  Location: [facility] ENDOSCOPY;  Service: Endoscopy;  Laterality: N/A;   ESOPHAGEAL BANDING  11/19/2022   Procedure: ESOPHAGEAL BANDING;  Surgeon: Jenel Lucks, MD;  Location: [facility] ENDOSCOPY;  Service: Gastroenterology;;   ESOPHAGOGASTRODUODENOSCOPY (EGD) WITH PROPOFOL N/A 12/14/2019   Procedure: ESOPHAGOGASTRODUODENOSCOPY (EGD) WITH PROPOFOL;  Surgeon: Napoleon Form, MD;  Location: [facility] ENDOSCOPY;  Service: Endoscopy;  Laterality: N/A;   ESOPHAGOGASTRODUODENOSCOPY (EGD) WITH PROPOFOL N/A 11/19/2022   Procedure: ESOPHAGOGASTRODUODENOSCOPY (EGD) WITH PROPOFOL;  Surgeon: Jenel Lucks, MD;  Location: [facility] ENDOSCOPY;  Service: Gastroenterology;  Laterality: N/A;   HERNIA REPAIR  1969   1 x at birth and at 57 years old   LAPAROSCOPIC SIGMOID COLECTOMY  2007   OPEN REDUCTION INTERNAL FIXATION (ORIF) HAND Right 2012   3rd  digit   POLYPECTOMY  12/14/2019   Procedure: POLYPECTOMY;  Surgeon: Napoleon Form, MD;  Location: [facility] ENDOSCOPY;  Service: Endoscopy;;    Allergies  Allergen Reactions   Aspirin Other (See Comments)    Caused acid reflux   Penicillins Hives    Has patient had a PCN reaction causing immediate rash, facial/tongue/throat swelling, SOB or lightheadedness with  hypotension: yes Has patient had a PCN reaction causing severe rash involving mucus membranes or skin necrosis: no Has patient had a PCN reaction that required hospitalization: no Has patient had a PCN reaction occurring within the last 10 years: no If all of the above answers are "NO", then may proceed with Cephalosporin use.     Objective / Physical Exam:  General:  The patient is alert and oriented x3 in no  acute distress. Dermatology:  Skin is cool, dry and supple bilateral lower extremities. Negative for open lesions or macerations. Vascular: VAS [place] ABI WITH/WO TBI 05/12/2022 ABI Findings:  +---------+------------------+-----+---------+--------+  Right   Rt Pressure (mmHg)IndexWaveform Comment   +---------+------------------+-----+---------+--------+  Brachial 136                    triphasic          +---------+------------------+-----+---------+--------+  PTA     133               0.98 triphasic          +---------+------------------+-----+---------+--------+  DP      138               1.01 triphasic          +---------+------------------+-----+---------+--------+  Great Toe115               0.85 Normal             +---------+------------------+-----+---------+--------+   +---------+------------------+-----+---------+-------+  Left    Lt Pressure (mmHg)IndexWaveform Comment  +---------+------------------+-----+---------+-------+  Brachial 123                    triphasic         +---------+------------------+-----+---------+-------+  PTA     113               0.83 triphasic         +---------+------------------+-----+---------+-------+  DP      119               0.88 triphasic         +---------+------------------+-----+---------+-------+  Great Toe98                0.72 Normal            +---------+------------------+-----+---------+-------+   +-------+-----------+-----------+------------+------------+  ABI/TBIToday's ABIToday's TBIPrevious ABIPrevious TBI  +-------+-----------+-----------+------------+------------+  Right 1.01       0.85       1.33        0.7           +-------+-----------+-----------+------------+------------+  Left  0.88       0.72       1.16        0.88          +-------+-----------+-----------+------------+------------+  Right ABIs and TBIs appear essentially unchanged. Left ABIs  appear  decreased.    Summary:  Right: Resting right ankle-brachial index is within normal range. No  evidence of significant right lower extremity arterial disease. The right  toe-brachial index is normal.  Left: Resting left ankle-brachial index indicates mild left lower  extremity arterial disease. The left toe-brachial index is normal.  Neurological:  Tenderness with light touch.  Concern for possible alcohol induced peripheral neuropathy Musculoskeletal Exam:  Pain on palpation to the anterior lateral medial aspects of the patient's right ankle and foot today.  No edema.  Generalized stiffness noted Radiographic Exam B/L ankles 02/08/2023:  Normal osseous mineralization. Joint spaces preserved. No fracture/dislocation/boney destruction.  Impression: Negative  Assessment: 1.  Capsulitis bilateral ankles 2.  Peripheral polyneuropathy possibly secondary to alcohol  Plan of Care:  -Patient was evaluated. X-Rays reviewed.  -Injection of 0.5 mL Celestone Soluspan injected in the patient's right ankle -Continue gabapentin 100 mg 3 times daily -Continue good supportive shoes and insoles that he is currently wearing -Return to clinic as needed   Felecia Shelling, DPM [facility]  Dr. Felecia Shelling, DPM    [address] [place], [place] [address]                Office [phone]  Fax [phone]

## 2023-06-25 NOTE — Addendum Note (Signed)
Addended by: Felecia Shelling on: 06/25/2023 10:14 PM   Modules accepted: Level of Service

## 2023-07-05 ENCOUNTER — Ambulatory Visit (INDEPENDENT_AMBULATORY_CARE_PROVIDER_SITE_OTHER): Payer: Medicaid Other

## 2023-07-05 ENCOUNTER — Ambulatory Visit (HOSPITAL_COMMUNITY)
Admission: EM | Admit: 2023-07-05 | Discharge: 2023-07-05 | Disposition: A | Discharge status: Home / Self Care | Payer: Medicaid Other | Attending: Internal Medicine | Admitting: Internal Medicine

## 2023-07-05 ENCOUNTER — Encounter (HOSPITAL_COMMUNITY): Payer: Self-pay | Admitting: Emergency Medicine

## 2023-07-05 DIAGNOSIS — J069 Acute upper respiratory infection, unspecified: Secondary | ICD-10-CM | POA: Diagnosis not present

## 2023-07-05 DIAGNOSIS — Z20822 Contact with and (suspected) exposure to covid-19: Secondary | ICD-10-CM | POA: Diagnosis present

## 2023-07-05 DIAGNOSIS — R059 Cough, unspecified: Secondary | ICD-10-CM | POA: Diagnosis not present

## 2023-07-05 DIAGNOSIS — M7989 Other specified soft tissue disorders: Secondary | ICD-10-CM | POA: Diagnosis not present

## 2023-07-05 DIAGNOSIS — R918 Other nonspecific abnormal finding of lung field: Secondary | ICD-10-CM | POA: Diagnosis not present

## 2023-07-05 DIAGNOSIS — F17210 Nicotine dependence, cigarettes, uncomplicated: Secondary | ICD-10-CM | POA: Insufficient documentation

## 2023-07-05 MED ORDER — BENZONATATE 100 MG PO CAPS: 100.0000 mg | ORAL_CAPSULE | Freq: Three times a day (TID) | ORAL | 0 refills | Status: DC

## 2023-07-05 MED ORDER — ALBUTEROL SULFATE HFA 108 (90 BASE) MCG/ACT IN AERS: 1.0000 | INHALATION_SPRAY | Freq: Four times a day (QID) | RESPIRATORY_TRACT | 0 refills | Status: AC | PRN

## 2023-07-05 NOTE — Discharge Instructions (Addendum)
You have a viral illness which will improve on its own with rest, fluids, and medications to help with your symptoms. COVID testing is pending, staff will call you if this is positive. Wear a mask for 5 days of symptoms while you are in public, then you may remove your mask. You may go back to work if you do not have a fever for 24 hours without any medicines.  We discussed prescriptions that may help with your symptoms: tessalon perles as needed for cough You may use over the counter medicines as needed: tylenol, mucinex, zyrtec, Flonase Two teaspoons of honey in 1 cup of warm water every 4-6 hours may help with throat pains. Humidifier in room at nighttime may help soothe cough (clean well daily).   For chest pain, shortness of breath, inability to keep food or fluids down without vomiting, fever that does not respond to tylenol or motrin, or any other severe symptoms, please go to the ER for further evaluation. Return to urgent care as needed, otherwise follow-up with PCP.
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
## 2023-07-05 NOTE — ED Triage Notes (Signed)
Family member dx w/ covid. Requesting to be checked for same. Reports a mild sore throat and cough, unsure if chronic. States he's been having more diarrhea recently

## 2023-07-06 ENCOUNTER — Telehealth (HOSPITAL_COMMUNITY): Payer: Self-pay | Admitting: Internal Medicine

## 2023-07-06 ENCOUNTER — Encounter (HOSPITAL_COMMUNITY): Payer: Self-pay | Admitting: Internal Medicine

## 2023-07-06 DIAGNOSIS — J189 Pneumonia, unspecified organism: Secondary | ICD-10-CM

## 2023-07-06 LAB — SARS CORONAVIRUS 2 (TAT 6-24 HRS): SARS Coronavirus 2: NEGATIVE

## 2023-07-06 MED ORDER — LEVOFLOXACIN 500 MG PO TABS: 500.0000 mg | ORAL_TABLET | Freq: Every day | ORAL | 0 refills | Status: AC

## 2023-07-06 NOTE — Telephone Encounter (Signed)
Radiology re-read of chest x-ray performed at urgent care visit yesterday July 05, 2023 shows findings suspicious for pneumonia.   Initially, thought chest x-ray and clinical presentation to be more consistent with bilateral chronic pleural effusions related to CHF.   Attempted to reach patient x2 via mobile phone and home phone numbers, both of which went straight to voicemail without greeting message.  Mychart message sent, patient has mychart access.  Levofloxacin 500mg  once daily for 5 days sent to treat suspected bacterial pneumonia.  Vital signs stable at visit. He does not meet CURB-65 criteria for inpatient treatment for pneumonia.  Follow-up with PCP in 4-5 days as discussed at visit.

## 2023-07-10 ENCOUNTER — Telehealth: Payer: Self-pay | Admitting: *Deleted

## 2023-07-10 ENCOUNTER — Ambulatory Visit: Payer: Self-pay | Admitting: *Deleted

## 2023-07-10 NOTE — Telephone Encounter (Signed)
  Chief Complaint: Back pain, foot and ankle pain Symptoms: "Need pain meds that help, not all these others I take that don't help." Frequency: "Long time, months, years." Pertinent Negatives: Patient denies  Disposition: [] ED /[] Urgent Care (no appt availability in office) / [] Appointment(In office/virtual)/ []  [facility]/ [] Home Care/ [] Refused Recommended Disposition /[] [facility]/ [x]  Follow-up with PCP Additional Notes:  Pt is evasive historian. States he is frustrated, has made many calls to practice. States "I am taking all these pills for pain and none are helping me."  Stated multiple times he went to "[employer] and health dept.  "Appt with you are too ar out." Assured pt NT would route to practice for review.   Requesting CB # [phone] Reason for Disposition  Back pain is a chronic symptom (recurrent or ongoing AND present > 4 weeks)  Answer Assessment - Initial Assessment Questions 1. ONSET: "When did the pain begin?"      *No Answer* 2. LOCATION: "Where does it hurt?" (upper, mid or lower back)     Upper back , neck and shoulders 3. SEVERITY: "How bad is the pain?"  (e.g., Scale 1-10; mild, moderate, or severe)   - MILD (1-3): Doesn't interfere with normal activities.    - MODERATE (4-7): Interferes with normal activities or awakens from sleep.    - SEVERE (8-10): Excruciating pain, unable to do any normal activities.      10/10 4. PATTERN: "Is the pain constant?" (e.g., yes, no; constant, intermittent)      Constant 5. RADIATION: "Does the pain shoot into your legs or somewhere else?"     At times 6. CAUSE:  "What do you think is causing the back pain?"      Injury a while ago 7. BACK OVERUSE:  "Any recent lifting of heavy objects, strenuous work or exercise?"     no 8. MEDICINES: "What have you taken so far for the pain?" (e.g., nothing, acetaminophen, NSAIDS)     All the meds except what I need for pain 9. NEUROLOGIC SYMPTOMS: "Do you have  any weakness, numbness, or problems with bowel/bladder control?"     NA 10. OTHER SYMPTOMS: "Do you have any other symptoms?" (e.g., fever, abdomen pain, burning with urination, blood in urine)       No  Protocols used: Back Pain-A-AH

## 2023-07-10 NOTE — Telephone Encounter (Signed)
Spoke with patient. Patient report having neck and shoulder pain. Verbalizes that is taking gabapentin and it is are not helping. Voiced that he does not understand why his PCP is not giving him medication for pain  Patient requested an appointment with PCP. Patient voiced that he has appointment with [facility] medical [facility] clinic on tomorrow and also has a [facility] to [facility]. Advised that [facility] has been trying to contact. Patient given [facility]'s contact information. Advised that he attend appointment with pain center and call  call us back for appointment after he sees pain clinic if needed.

## 2023-07-12 ENCOUNTER — Telehealth: Payer: Medicaid Other | Admitting: Family Medicine

## 2023-07-12 DIAGNOSIS — Z125 Encounter for screening for malignant neoplasm of prostate: Secondary | ICD-10-CM | POA: Diagnosis not present

## 2023-07-12 DIAGNOSIS — Z79899 Other long term (current) drug therapy: Secondary | ICD-10-CM | POA: Diagnosis not present

## 2023-07-12 DIAGNOSIS — E7800 Pure hypercholesterolemia, unspecified: Secondary | ICD-10-CM | POA: Diagnosis not present

## 2023-07-12 DIAGNOSIS — R5383 Other fatigue: Secondary | ICD-10-CM | POA: Diagnosis not present

## 2023-07-12 DIAGNOSIS — M129 Arthropathy, unspecified: Secondary | ICD-10-CM | POA: Diagnosis not present

## 2023-07-12 DIAGNOSIS — E559 Vitamin D deficiency, unspecified: Secondary | ICD-10-CM | POA: Diagnosis not present

## 2023-07-14 ENCOUNTER — Telehealth: Payer: Self-pay | Admitting: Family Medicine

## 2023-07-14 NOTE — Telephone Encounter (Signed)
Pt was called and informed that he would need to reach out to his pain management doctor for assistance.

## 2023-07-14 NOTE — Telephone Encounter (Signed)
Copied from CRM [id number]. Topic: General - Other >> Jul 13, 2023  3:21 PM Franchot Heidelberg wrote: Reason for CRM: Pt called reporting that the pharmacy told him to contact his PCP regarding his medication. He says that his insurance will not cover his medications.   Best contact: [phone]

## 2023-07-17 ENCOUNTER — Inpatient Hospital Stay (HOSPITAL_COMMUNITY)
Admission: EM | Admit: 2023-07-17 | Discharge: 2023-07-19 | DRG: 811 | Disposition: A | Discharge status: Home / Self Care | Payer: Medicaid Other | Attending: Internal Medicine | Admitting: Internal Medicine

## 2023-07-17 ENCOUNTER — Other Ambulatory Visit: Payer: Self-pay

## 2023-07-17 ENCOUNTER — Encounter (HOSPITAL_COMMUNITY): Payer: Self-pay | Admitting: Osteopathic Medicine

## 2023-07-17 DIAGNOSIS — K219 Gastro-esophageal reflux disease without esophagitis: Secondary | ICD-10-CM | POA: Diagnosis present

## 2023-07-17 DIAGNOSIS — D649 Anemia, unspecified: Secondary | ICD-10-CM | POA: Diagnosis not present

## 2023-07-17 DIAGNOSIS — D509 Iron deficiency anemia, unspecified: Principal | ICD-10-CM | POA: Diagnosis present

## 2023-07-17 DIAGNOSIS — I10 Essential (primary) hypertension: Secondary | ICD-10-CM | POA: Diagnosis not present

## 2023-07-17 DIAGNOSIS — Z6820 Body mass index (BMI) 20.0-20.9, adult: Secondary | ICD-10-CM

## 2023-07-17 DIAGNOSIS — K7030 Alcoholic cirrhosis of liver without ascites: Secondary | ICD-10-CM | POA: Diagnosis present

## 2023-07-17 DIAGNOSIS — Z79899 Other long term (current) drug therapy: Secondary | ICD-10-CM

## 2023-07-17 DIAGNOSIS — D638 Anemia in other chronic diseases classified elsewhere: Secondary | ICD-10-CM

## 2023-07-17 DIAGNOSIS — Z886 Allergy status to analgesic agent status: Secondary | ICD-10-CM

## 2023-07-17 DIAGNOSIS — E43 Unspecified severe protein-calorie malnutrition: Secondary | ICD-10-CM | POA: Diagnosis present

## 2023-07-17 DIAGNOSIS — Z880 Allergy status to penicillin: Secondary | ICD-10-CM

## 2023-07-17 DIAGNOSIS — D61818 Other pancytopenia: Secondary | ICD-10-CM | POA: Diagnosis present

## 2023-07-17 DIAGNOSIS — Z8711 Personal history of peptic ulcer disease: Secondary | ICD-10-CM

## 2023-07-17 DIAGNOSIS — F1010 Alcohol abuse, uncomplicated: Secondary | ICD-10-CM | POA: Diagnosis present

## 2023-07-17 DIAGNOSIS — Z9049 Acquired absence of other specified parts of digestive tract: Secondary | ICD-10-CM

## 2023-07-17 LAB — CBC WITH DIFFERENTIAL/PLATELET
Abs Immature Granulocytes: 0.01 10*3/uL (ref 0.00–0.07)
Basophils Absolute: 0 10*3/uL (ref 0.0–0.1)
Basophils Relative: 1 %
Eosinophils Absolute: 0.1 10*3/uL (ref 0.0–0.5)
Eosinophils Relative: 2 %
HCT: 19.1 % — ABNORMAL LOW (ref 39.0–52.0)
Hemoglobin: 5.1 g/dL — CL (ref 13.0–17.0)
Immature Granulocytes: 0 %
Lymphocytes Relative: 35 %
Lymphs Abs: 1 10*3/uL (ref 0.7–4.0)
MCH: 18.8 pg — ABNORMAL LOW (ref 26.0–34.0)
MCHC: 26.7 g/dL — ABNORMAL LOW (ref 30.0–36.0)
MCV: 70.5 fL — ABNORMAL LOW (ref 80.0–100.0)
Monocytes Absolute: 0.5 10*3/uL (ref 0.1–1.0)
Monocytes Relative: 17 %
Neutro Abs: 1.3 10*3/uL — ABNORMAL LOW (ref 1.7–7.7)
Neutrophils Relative %: 45 %
Platelets: 123 10*3/uL — ABNORMAL LOW (ref 150–400)
RBC: 2.71 MIL/uL — ABNORMAL LOW (ref 4.22–5.81)
RDW: 20.1 % — ABNORMAL HIGH (ref 11.5–15.5)
WBC: 2.9 10*3/uL — ABNORMAL LOW (ref 4.0–10.5)
nRBC: 0 % (ref 0.0–0.2)

## 2023-07-17 LAB — COMPREHENSIVE METABOLIC PANEL
ALT: 17 U/L (ref 0–44)
AST: 31 U/L (ref 15–41)
Albumin: 2.8 g/dL — ABNORMAL LOW (ref 3.5–5.0)
Alkaline Phosphatase: 132 U/L — ABNORMAL HIGH (ref 38–126)
Anion gap: 8 (ref 5–15)
BUN: <5 mg/dL — ABNORMAL LOW (ref 6–20)
CO2: 21 mmol/L — ABNORMAL LOW (ref 22–32)
Calcium: 8.6 mg/dL — ABNORMAL LOW (ref 8.9–10.3)
Chloride: 108 mmol/L (ref 98–111)
Creatinine, Ser: 0.71 mg/dL (ref 0.61–1.24)
GFR, Estimated: >60 mL/min (ref >60)
Glucose, Bld: 89 mg/dL (ref 70–99)
Potassium: 3.5 mmol/L (ref 3.5–5.1)
Sodium: 137 mmol/L (ref 135–145)
Total Bilirubin: 1.6 mg/dL — ABNORMAL HIGH (ref 0.3–1.2)
Total Protein: 6.7 g/dL (ref 6.5–8.1)

## 2023-07-17 LAB — VITAMIN B12: Vitamin B-12: 346 pg/mL (ref 180–914)

## 2023-07-17 LAB — POC OCCULT BLOOD, ED: Fecal Occult Bld: NEGATIVE

## 2023-07-17 LAB — PREPARE RBC (CROSSMATCH)

## 2023-07-17 LAB — FERRITIN: Ferritin: 3 ng/mL — ABNORMAL LOW (ref 24–336)

## 2023-07-17 LAB — FOLATE: Folate: 12.8 ng/mL (ref >5.9)

## 2023-07-17 LAB — PROTIME-INR
INR: 1.8 — ABNORMAL HIGH (ref 0.8–1.2)
Prothrombin Time: 20.9 s — ABNORMAL HIGH (ref 11.4–15.2)

## 2023-07-17 LAB — IRON AND TIBC
Iron: 12 ug/dL — ABNORMAL LOW (ref 45–182)
Saturation Ratios: 2 % — ABNORMAL LOW (ref 17.9–39.5)
TIBC: 532 ug/dL — ABNORMAL HIGH (ref 250–450)
UIBC: 520 ug/dL

## 2023-07-17 MED ORDER — PANTOPRAZOLE SODIUM 40 MG PO TBEC
40.0000 mg | DELAYED_RELEASE_TABLET | Freq: Every day | ORAL | Status: DC
  Administered 2023-07-18 – 2023-07-19 (×2): 40 mg via ORAL
  Filled 2023-07-17 (×2): qty 1

## 2023-07-17 MED ORDER — FUROSEMIDE 40 MG PO TABS
40.0000 mg | ORAL_TABLET | Freq: Every day | ORAL | Status: DC
  Administered 2023-07-18 – 2023-07-19 (×2): 40 mg via ORAL
  Filled 2023-07-17 (×2): qty 1

## 2023-07-17 MED ORDER — SPIRONOLACTONE 12.5 MG HALF TABLET
12.5000 mg | ORAL_TABLET | Freq: Every day | ORAL | Status: DC
  Administered 2023-07-17 – 2023-07-19 (×3): 12.5 mg via ORAL
  Filled 2023-07-17 (×3): qty 1

## 2023-07-17 MED ORDER — GABAPENTIN 300 MG PO CAPS
300.0000 mg | ORAL_CAPSULE | Freq: Three times a day (TID) | ORAL | Status: DC
  Administered 2023-07-17 – 2023-07-19 (×6): 300 mg via ORAL
  Filled 2023-07-17 (×6): qty 1

## 2023-07-17 MED ORDER — MIRTAZAPINE 15 MG PO TABS
15.0000 mg | ORAL_TABLET | Freq: Every day | ORAL | Status: DC
  Administered 2023-07-17 – 2023-07-18 (×2): 15 mg via ORAL
  Filled 2023-07-17 (×2): qty 1

## 2023-07-17 MED ORDER — SODIUM CHLORIDE 0.9% IV SOLUTION: Freq: Once | INTRAVENOUS | Status: AC

## 2023-07-17 MED ORDER — GUAIFENESIN-DM 100-10 MG/5ML PO SYRP
5.0000 mL | ORAL_SOLUTION | ORAL | Status: DC | PRN
  Administered 2023-07-17 – 2023-07-18 (×3): 5 mL via ORAL
  Filled 2023-07-17 (×3): qty 10

## 2023-07-17 MED ORDER — OXYCODONE HCL 5 MG PO TABS
5.0000 mg | ORAL_TABLET | ORAL | Status: DC | PRN
  Administered 2023-07-18 – 2023-07-19 (×2): 5 mg via ORAL
  Filled 2023-07-17 (×2): qty 1

## 2023-07-17 MED ORDER — THIAMINE HCL 100 MG PO TABS
100.0000 mg | ORAL_TABLET | Freq: Every day | ORAL | Status: DC
  Administered 2023-07-17 – 2023-07-19 (×3): 100 mg via ORAL
  Filled 2023-07-17 (×6): qty 1

## 2023-07-17 MED ORDER — ALBUTEROL SULFATE (2.5 MG/3ML) 0.083% IN NEBU
3.0000 mL | INHALATION_SOLUTION | Freq: Four times a day (QID) | RESPIRATORY_TRACT | Status: DC | PRN
  Administered 2023-07-18: 3 mL via RESPIRATORY_TRACT
  Filled 2023-07-17: qty 3

## 2023-07-17 MED ORDER — PROPRANOLOL HCL 10 MG PO TABS
10.0000 mg | ORAL_TABLET | Freq: Two times a day (BID) | ORAL | Status: DC
  Administered 2023-07-17 – 2023-07-19 (×4): 10 mg via ORAL
  Filled 2023-07-17 (×5): qty 1

## 2023-07-17 MED ORDER — FOLIC ACID 1 MG PO TABS
1.0000 mg | ORAL_TABLET | Freq: Every day | ORAL | Status: DC
  Administered 2023-07-17 – 2023-07-19 (×3): 1 mg via ORAL
  Filled 2023-07-17 (×3): qty 1

## 2023-07-17 MED ORDER — DICLOFENAC SODIUM 1 % EX GEL
2.0000 g | Freq: Four times a day (QID) | CUTANEOUS | Status: DC | PRN
  Filled 2023-07-17: qty 100

## 2023-07-17 MED ORDER — LACTULOSE 10 GM/15ML PO SOLN
20.0000 g | Freq: Two times a day (BID) | ORAL | Status: DC
  Administered 2023-07-18: 20 g via ORAL
  Filled 2023-07-17 (×3): qty 30

## 2023-07-17 NOTE — ED Provider Triage Note (Signed)
Emergency Medicine Provider Triage Evaluation Note  Richard [name] , a 57 y.o. male  was evaluated in triage.  Patient had blood work done last week and received a call this morning telling him he has low hemoglobin and it was in the "4s".  Patient believes he has a history of the same.  Endorses extreme fatigue.  He has had 1 episode of dark stools 1 month ago, but denies any BRBPR or hematemesis.  Review of Systems  Positive: As above Negative: As above  Physical Exam  BP 125/62 (BP Location: Right Arm)   Pulse 82   Temp 98.3 F (36.8 C) (Oral)   Resp (!) 22   SpO2 100%  Gen:   Awake, no distress, appears pale Resp:  Normal effort  MSK:   Moves extremities without difficulty  Other:  Abdomen appears mildly distended  Medical Decision Making  Medically screening exam initiated at 12:37 PM.  Appropriate orders placed.  Richard [name] was informed that the remainder of the evaluation will be completed by another provider, this initial triage assessment does not replace that evaluation, and the importance of remaining in the ED until their evaluation is complete.     Melton Alar R, PA-C 07/17/23 1239

## 2023-07-17 NOTE — ED Notes (Signed)
ED TO INPATIENT HANDOFF REPORT  ED Nurse Name and Phone #: Einar Grad [phone]  S Name/Age/Gender Richard [name] 57 y.o. male Room/Bed: 032C/032C  Code Status   Code Status: Full Code  Home/SNF/Other Home Patient oriented to: self, place, time, and situation Is this baseline? Yes   Triage Complete: Triage complete  Chief Complaint Anemia [D64.9]  Triage Note Pt to ED POV from home. Pt states he had blood work drawn last week out patient and received a call this morning telling him to come to the ED for low hemoglobin. Pt states he thinks he has a hx of same. Pt endorses fatigue. Pt states he has had one episode of dark stools 1 month ago. Pt denies any other s/s of bleeding.    Allergies Allergies  Allergen Reactions   Aspirin Other (See Comments)    Caused acid reflux   Penicillins Hives    Has patient had a PCN reaction causing immediate rash, facial/tongue/throat swelling, SOB or lightheadedness with hypotension: yes Has patient had a PCN reaction causing severe rash involving mucus membranes or skin necrosis: no Has patient had a PCN reaction that required hospitalization: no Has patient had a PCN reaction occurring within the last 10 years: no If all of the above answers are "NO", then may proceed with Cephalosporin use.     Level of Care/Admitting Diagnosis ED Disposition     ED Disposition  Admit   Condition  --   Comment  Hospital Area: [facility] [[id number]]  Level of Care: Med-Surg [16]  May place patient in observation at [facility] or [facility] if equivalent level of care is available:: No  Covid Evaluation: Asymptomatic - no recent exposure (last 10 days) testing not required  Diagnosis: Anemia [604540]  Admitting Physician: Sunnie Nielsen [[id number]]  Attending Physician: Sunnie Nielsen [phone]          B Medical/Surgery History Past Medical History:  Diagnosis Date   Abnormal nuclear cardiac imaging test    Acid  reflux    Acute pancreatitis 08/14/2018   Alcohol withdrawal delirium (HCC) 08/20/2016   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   Alcoholic hepatitis    Alcoholic ketoacidosis 11/13/2017   Ascites 12/13/2019   Aspiration pneumonia (HCC) 08/20/2016   Chest pain of uncertain etiology    Chronic systolic CHF (congestive heart failure) (HCC)    Cirrhosis (HCC)    Colon cancer (HCC)    DCM (dilated cardiomyopathy) (HCC)    Drug abuse (HCC) 01/07/2020   Elevated troponin 06/27/2018   ETOH abuse    Gastropathy 08/14/2018   Heme positive stool 11/13/2017   Hepatic steatosis 08/21/2016   High anion gap metabolic acidosis 11/05/2018   History of colon cancer    HTN (hypertension)    Hypertensive urgency 06/27/2018   Hypoglycemia 06/27/2018   Hypokalemia 01/07/2020   Hypomagnesemia    Hypophosphatemia    Lactic acidosis 08/20/2016   Leukocytosis 01/07/2020   Neuropathy    Pancreatitis 08/2018   Polyp of ascending colon    Prolonged Q-T interval on ECG    Prolonged QT interval    Protein-calorie malnutrition, severe 05/02/2018   PUD (peptic ulcer disease)    SBP (spontaneous bacterial peritonitis) (HCC) 01/07/2020   Sepsis (HCC) 08/20/2016   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   SVT (supraventricular tachycardia) (HCC)    Thrombocytopenia (HCC) 08/21/2016   Past Surgical History:  Procedure Laterality Date   BIOPSY  12/14/2019   Procedure: BIOPSY;  Surgeon: Napoleon Form, MD;  Location: [facility] ENDOSCOPY;  Service: Endoscopy;;   BIOPSY  11/19/2022   Procedure: BIOPSY;  Surgeon: Jenel Lucks, MD;  Location: [facility] ENDOSCOPY;  Service: Gastroenterology;;   catherization  2007   COLONOSCOPY WITH PROPOFOL N/A 12/14/2019   Procedure: COLONOSCOPY WITH PROPOFOL;  Surgeon: Napoleon Form, MD;  Location: [facility] ENDOSCOPY;  Service: Endoscopy;  Laterality: N/A;   ESOPHAGEAL BANDING  11/19/2022   Procedure: ESOPHAGEAL BANDING;  Surgeon: Jenel Lucks, MD;  Location: [facility]  ENDOSCOPY;  Service: Gastroenterology;;   ESOPHAGOGASTRODUODENOSCOPY (EGD) WITH PROPOFOL N/A 12/14/2019   Procedure: ESOPHAGOGASTRODUODENOSCOPY (EGD) WITH PROPOFOL;  Surgeon: Napoleon Form, MD;  Location: [facility] ENDOSCOPY;  Service: Endoscopy;  Laterality: N/A;   ESOPHAGOGASTRODUODENOSCOPY (EGD) WITH PROPOFOL N/A 11/19/2022   Procedure: ESOPHAGOGASTRODUODENOSCOPY (EGD) WITH PROPOFOL;  Surgeon: Jenel Lucks, MD;  Location: [facility] ENDOSCOPY;  Service: Gastroenterology;  Laterality: N/A;   HERNIA REPAIR  1967   1 x at birth and at 57 years old   LAPAROSCOPIC SIGMOID COLECTOMY  2007   OPEN REDUCTION INTERNAL FIXATION (ORIF) HAND Right 2012   3rd  digit   POLYPECTOMY  12/14/2019   Procedure: POLYPECTOMY;  Surgeon: Napoleon Form, MD;  Location: [facility] ENDOSCOPY;  Service: Endoscopy;;     A IV Location/Drains/Wounds Patient Lines/Drains/Airways Status     Active Line/Drains/Airways     Name Placement date Placement time Site Days   Peripheral IV 07/17/23 22 G Anterior;Right Forearm 07/17/23  1426  Forearm  less than 1   Peripheral IV 07/17/23 20 G Anterior;Left;Proximal Forearm 07/17/23  1426  Forearm  less than 1            Intake/Output Last 24 hours No intake or output data in the 24 hours ending 07/17/23 1549  Labs/Imaging Results for orders placed or performed during the hospital encounter of 07/17/23 (from the past 48 hour(s))  CBC with Differential     Status: Abnormal   Collection Time: 07/17/23 12:45 PM  Result Value Ref Range   WBC 2.9 (L) 4.0 - 10.5 K/uL   RBC 2.71 (L) 4.22 - 5.81 MIL/uL   Hemoglobin 5.1 (LL) 13.0 - 17.0 g/dL    Comment: Reticulocyte Hemoglobin testing may be clinically indicated, consider ordering this additional test [id number] THIS CRITICAL RESULT HAS VERIFIED AND BEEN CALLED TO CRYSTAL BLACK RN BY ELHAM AHMED ON 10 14 2024 AT 1334, AND HAS BEEN READ BACK.  REPEATED TO VERIFY CORRECTED ON 10/14 AT 1337: PREVIOUSLY REPORTED AS 5.1 Reticulocyte  Hemoglobin testing may be clinically indicated, consider ordering this additional test [id number] THIS CRITICAL RESULT HAS VERIFIED AND BEEN CALLED TO CRYSTAL BLACK RN BY ELHAM AHMED ON 10  14 2024 AT 1334, AND HAS BEEN READ BACK.     HCT 19.1 (L) 39.0 - 52.0 %   MCV 70.5 (L) 80.0 - 100.0 fL   MCH 18.8 (L) 26.0 - 34.0 pg   MCHC 26.7 (L) 30.0 - 36.0 g/dL   RDW 01.6 (H) 01.0 - 93.2 %   Platelets 123 (L) 150 - 400 K/uL    Comment: REPEATED TO VERIFY   nRBC 0.0 0.0 - 0.2 %   Neutrophils Relative % 45 %   Neutro Abs 1.3 (L) 1.7 - 7.7 K/uL   Lymphocytes Relative 35 %   Lymphs Abs 1.0 0.7 - 4.0 K/uL   Monocytes Relative 17 %   Monocytes Absolute 0.5 0.1 - 1.0 K/uL   Eosinophils Relative 2 %   Eosinophils Absolute 0.1 0.0 - 0.5 K/uL  Basophils Relative 1 %   Basophils Absolute 0.0 0.0 - 0.1 K/uL   Immature Granulocytes 0 %   Abs Immature Granulocytes 0.01 0.00 - 0.07 K/uL    Comment: Performed at [facility], [address], [place], [place] [address]  Comprehensive metabolic panel     Status: Abnormal   Collection Time: 07/17/23 12:45 PM  Result Value Ref Range   Sodium 137 135 - 145 mmol/L   Potassium 3.5 3.5 - 5.1 mmol/L   Chloride 108 98 - 111 mmol/L   CO2 21 (L) 22 - 32 mmol/L   Glucose, Bld 89 70 - 99 mg/dL    Comment: Glucose reference range applies only to samples taken after fasting for at least 8 hours.   BUN <5 (L) 6 - 20 mg/dL   Creatinine, Ser 1.91 0.61 - 1.24 mg/dL   Calcium 8.6 (L) 8.9 - 10.3 mg/dL   Total Protein 6.7 6.5 - 8.1 g/dL   Albumin 2.8 (L) 3.5 - 5.0 g/dL   AST 31 15 - 41 U/L   ALT 17 0 - 44 U/L   Alkaline Phosphatase 132 (H) 38 - 126 U/L   Total Bilirubin 1.6 (H) 0.3 - 1.2 mg/dL   GFR, Estimated >47 >82 mL/min    Comment: (NOTE) Calculated using the CKD-EPI Creatinine Equation (2021)    Anion gap 8 5 - 15    Comment: Performed at [facility], [address], [place], [place] [address]  Type and screen [facility]      Status: None (Preliminary result)   Collection Time: 07/17/23 12:49 PM  Result Value Ref Range   ABO/RH(D) B POS    Antibody Screen NEG    Sample Expiration 07/20/2023,2359    Unit Number [id number]    Blood Component Type RED CELLS,LR    Unit division 00    Status of Unit ALLOCATED    Transfusion Status OK TO TRANSFUSE    Crossmatch Result Compatible    Unit Number [id number]    Blood Component Type RED CELLS,LR    Unit division 00    Status of Unit ISSUED    Transfusion Status OK TO TRANSFUSE    Crossmatch Result      Compatible Performed at [facility], [address], [place], [place] [address]   Prepare RBC (crossmatch)     Status: None   Collection Time: 07/17/23  2:12 PM  Result Value Ref Range   Order Confirmation      ORDER PROCESSED BY BLOOD BANK Performed at [facility], [address], [place], [place] [address]   POC occult blood, ED Provider will collect     Status: None   Collection Time: 07/17/23  2:34 PM  Result Value Ref Range   Fecal Occult Bld NEGATIVE NEGATIVE   No results found.  Pending Labs Unresulted Labs (From admission, onward)     Start     Ordered   07/18/23 0500  Comprehensive metabolic panel  Tomorrow morning,   R        07/17/23 1549   07/18/23 0500  CBC  Tomorrow morning,   R        07/17/23 1549   07/17/23 1600  Iron and TIBC  Once,   R        07/17/23 1600   07/17/23 1600  Ferritin  Once,   R        07/17/23 1600   07/17/23 1550  Protime-INR  Once,   STAT        07/17/23 1550   07/17/23 1448  Vitamin B12  (Anemia Panel (PNL))  Once,   URGENT        07/17/23 1448   07/17/23 1448  Folate  (Anemia Panel (PNL))  Once,   URGENT        07/17/23 1448   07/17/23 1448  Reticulocytes  (Anemia Panel (PNL))  Once,   URGENT        07/17/23 1448            Vitals/Pain Today's Vitals   07/17/23 1238 07/17/23 1524 07/17/23 1538 07/17/23 1542  BP:   112/72   Pulse:   80   Resp:   17   Temp:    98.2 F (36.8 C)   TempSrc:    Oral  SpO2:   100%   Weight:  67.1 kg    Height:  6' (1.829 m)    PainSc: 0-No pain       Isolation Precautions No active isolations  Medications Medications  0.9 %  sodium chloride infusion (Manually program via Guardrails IV Fluids) (has no administration in time range)  oxyCODONE (Oxy IR/ROXICODONE) immediate release tablet 5 mg (has no administration in time range)    Mobility walks with device     Focused Assessments Cardiac Assessment Handoff:    Lab Results  Component Value Date   CKTOTAL 657 (H) 08/21/2016   TROPONINI <0.03 11/05/2018   No results found for: "DDIMER" Does the Patient currently have chest pain? No   , Pulmonary Assessment Handoff:  Lung sounds:   O2 Device: Room Air      R Recommendations: See Admitting Provider Note  Report given to:   Additional Notes:

## 2023-07-17 NOTE — ED Notes (Signed)
Critical lab, hgb=5.1, PA aware.

## 2023-07-17 NOTE — ED Triage Notes (Signed)
Pt to ED POV from home. Pt states he had blood work drawn last week out patient and received a call this morning telling him to come to the ED for low hemoglobin. Pt states he thinks he has a hx of same. Pt endorses fatigue. Pt states he has had one episode of dark stools 1 month ago. Pt denies any other s/s of bleeding.

## 2023-07-17 NOTE — H&P (Signed)
HISTORY AND PHYSICAL    Richard [name]   QIO:[id number] DOB: Jul 06, 1966   Date of Service: 07/17/23 Requesting physician/APP from ED: Treatment Team:  Attending Provider: Sunnie Nielsen, DO  PCP: Hoy Register, MD   HPI: Richard [name] is a 57 y.o. male w/ PMH alcoholic liver cirrhosis, history of alcohol abuse states no alcohol intake over the past year, GERD, colon cancer status post partial colectomy, SVT, dilated cardiomyopathy EF 60 to 65%, depression, blindness left eye.  To ED today with chief complaint abnormal labs from outpatient blood draw, had been complaining of fatigue and pain management clinic drew labs and informed him today of anemia and advised him to come to the emergency department.  Reports worsening fatigue over the past couple of weeks, has not had any dizziness or loss of consciousness, no syncope/presyncope, occasional lightheadedness.  No new or worsening abdominal pain though he does report feeling somewhat abdominal fullness.  No significant lower extremity edema though he does report feeling like his right foot is a bit more swollen than usual.  Denies blood in stool, urine.  Denies dark black stool.  No constipation/diarrhea, reports some nausea on occasion but no vomiting.  In the ED, vital signs stable, hemoglobin 5.1, hematocrit 19.1, MCV 70.5, platelets 123, WBC 2.9, albumin 2.8, LFT WNL, INR 1.8, EKG NSR, FOBT negative.  2 units PRBC ordered, hospitalist contacted for admission.      Consultants:  none  Procedures: none      ASSESSMENT & PLAN:   Microcytic anemia, likely acute on chronic Chronic anemia and pancytopenia - anemia likely due to iron deficiency and/or liver disease, pancytopenia supports liver disease Baseline hemoglobin over the past year 7.5-8.2 Pending transfusion PRBC, 2 units ordered Follow H/H, CBC Further pending studies: Reticulocytes, ferritin, iron, TIBC, folate, B12 Monitor for bleeding, will defer  GI consultation at this time as no evidence for GI bleed Dissipate may need iron infusion as well No S/S bleeding but will leave CLD for now given history of PUD  Alcoholic cirrhosis Does not appear to be acute hepatitis, not encephalopathic Monitor CMP Encouraged compliance with Lasix, spironolactone -restarted spironolactone here which she has not been taking at home Follow outpatient  GERD Continue home PPI  History SVT Restarted home propranolol    DVT prophylaxis: SCD Pertinent IV fluids/nutrition: No continuous IV fluids, clear liquid diet for now, hopefully can advance tomorrow to solids Central lines / invasive devices: None  Code Status: Full code Family Communication: Patient requests call to mother, will reach out to her later today  Disposition: Observation TOC needs: None at this time Barriers to discharge / significant pending items: Clinical improvement/stabilization of hemoglobin/hematocrit, confirm iron deficiency may need iron infusion, if CBC/H&H remain stable may be able to discharge home tomorrow afternoon                  Review of Systems:  Review of Systems  Constitutional:  Positive for malaise/fatigue. Negative for chills and fever.  Respiratory:  Positive for cough. Negative for hemoptysis, sputum production and shortness of breath.   Cardiovascular:  Positive for leg swelling (reports minor swelling on R  foot). Negative for chest pain, palpitations, orthopnea and claudication.  Gastrointestinal:  Positive for nausea. Negative for abdominal pain, blood in stool, constipation, diarrhea, heartburn, melena and vomiting.  Genitourinary:  Negative for frequency and urgency.  Musculoskeletal:  Negative for myalgias.  Neurological:  Positive for weakness. Negative for dizziness, focal weakness and headaches.  has a past medical history of Abnormal nuclear cardiac imaging test, Acid reflux, Acute pancreatitis (08/14/2018), Alcohol  withdrawal delirium (HCC) (08/20/2016), Alcoholic cardiomyopathy (HCC) (01/07/2020), Alcoholic hepatitis, Alcoholic ketoacidosis (11/13/2017), Ascites (12/13/2019), Aspiration pneumonia (HCC) (08/20/2016), Chest pain of uncertain etiology, Chronic systolic CHF (congestive heart failure) (HCC), Cirrhosis (HCC), Colon cancer (HCC), DCM (dilated cardiomyopathy) (HCC), Drug abuse (HCC) (01/07/2020), Elevated troponin (06/27/2018), ETOH abuse, Gastropathy (08/14/2018), Heme positive stool (11/13/2017), Hepatic steatosis (08/21/2016), High anion gap metabolic acidosis (11/05/2018), History of colon cancer, HTN (hypertension), Hypertensive urgency (06/27/2018), Hypoglycemia (06/27/2018), Hypokalemia (01/07/2020), Hypomagnesemia, Hypophosphatemia, Lactic acidosis (08/20/2016), Leukocytosis (01/07/2020), Neuropathy, Pancreatitis (08/2018), Polyp of ascending colon, Prolonged Q-T interval on ECG, Prolonged QT interval, Protein-calorie malnutrition, severe (05/02/2018), PUD (peptic ulcer disease), SBP (spontaneous bacterial peritonitis) (HCC) (01/07/2020), Sepsis (HCC) (08/20/2016), Septal infarction (HCC) (01/07/2020), SVT (supraventricular tachycardia) (HCC), and Thrombocytopenia (HCC) (08/21/2016).  No current facility-administered medications on file prior to encounter.   Current Outpatient Medications on File Prior to Encounter  Medication Sig Dispense Refill   acetaminophen (TYLENOL) 650 MG CR tablet Take 1,300 mg by mouth every 8 (eight) hours as needed for pain.     albuterol (VENTOLIN HFA) 108 (90 Base) MCG/ACT inhaler Inhale 1-2 puffs into the lungs every 6 (six) hours as needed for wheezing or shortness of breath. 8 g 0   diclofenac Sodium (VOLTAREN) 1 % GEL Apply 2 g topically 4 (four) times daily as needed (pain).     fluticasone (FLONASE) 50 MCG/ACT nasal spray Place 1 spray into both nostrils daily as needed for allergies or rhinitis.     furosemide (LASIX) 40 MG tablet Take 1 tablet (40 mg total) by  mouth daily. 90 tablet 0   gabapentin (NEURONTIN) 300 MG capsule Take 1 capsule (300 mg total) by mouth 3 (three) times daily. 90 capsule 3   lactulose (CHRONULAC) 10 GM/15ML solution Take 30 mLs (20 g total) by mouth 2 (two) times daily. 1800 mL 2   levofloxacin (LEVAQUIN) 500 MG tablet Take 500 mg by mouth daily. For 5 days.     mirtazapine (REMERON) 15 MG tablet Take 1 tablet (15 mg total) by mouth at bedtime. 90 tablet 1   pantoprazole (PROTONIX) 40 MG tablet TAKE 1 TABLET(40 MG) BY MOUTH DAILY 90 tablet 0   benzonatate (TESSALON) 100 MG capsule Take 1 capsule (100 mg total) by mouth every 8 (eight) hours. (Patient not taking: Reported on 07/17/2023) 21 capsule 0   ferrous gluconate (FERGON) 324 MG tablet Take 1 tablet (324 mg total) by mouth 2 (two) times daily with a meal. (Patient not taking: Reported on 05/29/2023) 60 tablet 3   folic acid (FOLVITE) 1 MG tablet Take 1 tablet (1 mg total) by mouth daily. (Patient not taking: Reported on 05/29/2023) 30 tablet 0   methocarbamol (ROBAXIN) 500 MG tablet Take 1 tablet (500 mg total) by mouth every 8 (eight) hours as needed for muscle spasms. (Patient not taking: Reported on 07/17/2023) 60 tablet 1   potassium chloride SA (KLOR-CON M) 20 MEQ tablet Take 1 tablet (20 mEq total) by mouth daily. (Patient not taking: Reported on 05/29/2023) 90 tablet 1   propranolol (INDERAL) 20 MG tablet Take 1 tablet (20 mg total) by mouth 2 (two) times daily. (Patient not taking: Reported on 05/29/2023) 60 tablet 2   spironolactone (ALDACTONE) 100 MG tablet Take 1 tablet (100 mg total) by mouth daily. (Patient not taking: Reported on 05/29/2023) 30 tablet 2   thiamine 100 MG tablet Take 1 tablet (100 mg total)  by mouth daily. (Patient not taking: Reported on 05/29/2023) 30 tablet 1     Allergies  Allergen Reactions   Aspirin Other (See Comments)    Caused acid reflux   Penicillins Hives    Has patient had a PCN reaction causing immediate rash, facial/tongue/throat  swelling, SOB or lightheadedness with hypotension: yes Has patient had a PCN reaction causing severe rash involving mucus membranes or skin necrosis: no Has patient had a PCN reaction that required hospitalization: no Has patient had a PCN reaction occurring within the last 10 years: no If all of the above answers are "NO", then may proceed with Cephalosporin use.       family history includes Cancer in his sister; Colon cancer in his cousin and father; Hypertension in his mother; Kidney disease in his sister. Past Surgical History:  Procedure Laterality Date   BIOPSY  12/14/2019   Procedure: BIOPSY;  Surgeon: Napoleon Form, MD;  Location: [facility] ENDOSCOPY;  Service: Endoscopy;;   BIOPSY  11/19/2022   Procedure: BIOPSY;  Surgeon: Jenel Lucks, MD;  Location: [facility] ENDOSCOPY;  Service: Gastroenterology;;   catherization  2007   COLONOSCOPY WITH PROPOFOL N/A 12/14/2019   Procedure: COLONOSCOPY WITH PROPOFOL;  Surgeon: Napoleon Form, MD;  Location: [facility] ENDOSCOPY;  Service: Endoscopy;  Laterality: N/A;   ESOPHAGEAL BANDING  11/19/2022   Procedure: ESOPHAGEAL BANDING;  Surgeon: Jenel Lucks, MD;  Location[facility] ENDOSCOPY;  Service: Gastroenterology;;   ESOPHAGOGASTRODUODENOSCOPY (EGD) WITH PROPOFOL N/A 12/14/2019   Procedure: ESOPHAGOGASTRODUODENOSCOPY (EGD) WITH PROPOFOL;  Surgeon: Napoleon Form, MD;  Location: [facility] ENDOSCOPY;  Service: Endoscopy;  Laterality: N/A;   ESOPHAGOGASTRODUODENOSCOPY (EGD) WITH PROPOFOL N/A 11/19/2022   Procedure: ESOPHAGOGASTRODUODENOSCOPY (EGD) WITH PROPOFOL;  Surgeon: Jenel Lucks, MD;  Location: [facility] ENDOSCOPY;  Service: Gastroenterology;  Laterality: N/A;   HERNIA REPAIR  1969   1 x at birth and at 57 years old   LAPAROSCOPIC SIGMOID COLECTOMY  2007   OPEN REDUCTION INTERNAL FIXATION (ORIF) HAND Right 2012   3rd  digit   POLYPECTOMY  12/14/2019   Procedure: POLYPECTOMY;  Surgeon: Napoleon Form, MD;  Location: [facility] ENDOSCOPY;  Service:  Endoscopy;;          Objective Findings:  Vitals:   07/17/23 1524 07/17/23 1538 07/17/23 1542 07/17/23 1557  BP:  112/72  118/68  Pulse:  80  81  Resp:  17  17  Temp:   98.2 F (36.8 C) 98.5 F (36.9 C)  TempSrc:   Oral Oral  SpO2:  100%  100%  Weight: 67.1 kg     Height: 6' (1.829 m)      No intake or output data in the 24 hours ending 07/17/23 1627 Filed Weights   07/17/23 1524  Weight: 67.1 kg    Examination:  Physical Exam Constitutional:      General: He is not in acute distress.    Appearance: Normal appearance.  Eyes:     General: No scleral icterus. Cardiovascular:     Rate and Rhythm: Normal rate and regular rhythm.     Heart sounds: Normal heart sounds.  Pulmonary:     Effort: Pulmonary effort is normal.     Breath sounds: Normal breath sounds.  Abdominal:     Palpations: Abdomen is soft. There is no mass.     Tenderness: There is no abdominal tenderness. There is no guarding.  Musculoskeletal:        General: No tenderness.     Right lower  leg: No edema.     Left lower leg: No edema.  Skin:    General: Skin is warm and dry.  Neurological:     General: No focal deficit present.     Mental Status: He is alert and oriented to person, place, and time.  Psychiatric:        Mood and Affect: Mood normal.        Behavior: Behavior normal.          Scheduled Medications:   sodium chloride   Intravenous Once    Continuous Infusions:   PRN Medications:  oxyCODONE  Antimicrobials:  Anti-infectives (From admission, onward)    None           Data Reviewed: I have personally reviewed following labs and imaging studies  CBC: Recent Labs  Lab 07/17/23 1245  WBC 2.9*  NEUTROABS 1.3*  HGB 5.1*  HCT 19.1*  MCV 70.5*  PLT 123*   Basic Metabolic Panel: Recent Labs  Lab 07/17/23 1245  NA 137  K 3.5  CL 108  CO2 21*  GLUCOSE 89  BUN <5*  CREATININE 0.71  CALCIUM 8.6*   GFR: Estimated Creatinine Clearance: 99 mL/min  (by C-G formula based on SCr of 0.71 mg/dL). Liver Function Tests: Recent Labs  Lab 07/17/23 1245  AST 31  ALT 17  ALKPHOS 132*  BILITOT 1.6*  PROT 6.7  ALBUMIN 2.8*   No results for input(s): "LIPASE", "AMYLASE" in the last 168 hours. No results for input(s): "AMMONIA" in the last 168 hours. Coagulation Profile: Recent Labs  Lab 07/17/23 1531  INR 1.8*   Cardiac Enzymes: No results for input(s): "CKTOTAL", "CKMB", "CKMBINDEX", "TROPONINI" in the last 168 hours. BNP (last 3 results) No results for input(s): "PROBNP" in the last 8760 hours. HbA1C: No results for input(s): "HGBA1C" in the last 72 hours. CBG: No results for input(s): "GLUCAP" in the last 168 hours. Lipid Profile: No results for input(s): "CHOL", "HDL", "LDLCALC", "TRIG", "CHOLHDL", "LDLDIRECT" in the last 72 hours. Thyroid Function Tests: No results for input(s): "TSH", "T4TOTAL", "FREET4", "T3FREE", "THYROIDAB" in the last 72 hours. Anemia Panel: No results for input(s): "VITAMINB12", "FOLATE", "FERRITIN", "TIBC", "IRON", "RETICCTPCT" in the last 72 hours. Most Recent Urinalysis On File:     Component Value Date/Time   COLORURINE AMBER (A) 11/16/2022 0125   APPEARANCEUR CLEAR 11/16/2022 0125   LABSPEC 1.023 11/16/2022 0125   PHURINE 5.0 11/16/2022 0125   GLUCOSEU NEGATIVE 11/16/2022 0125   HGBUR NEGATIVE 11/16/2022 0125   BILIRUBINUR MODERATE (A) 11/16/2022 0125   KETONESUR 5 (A) 11/16/2022 0125   PROTEINUR NEGATIVE 11/16/2022 0125   UROBILINOGEN 0.2 01/13/2009 1011   NITRITE NEGATIVE 11/16/2022 0125   LEUKOCYTESUR NEGATIVE 11/16/2022 0125   Sepsis Labs: @LABRCNTIP (procalcitonin:4,lacticidven:4)  No results found for this or any previous visit (from the past 240 hour(s)).       Radiology Studies: No results found.           LOS: 0 days     Sunnie Nielsen, DO [facility] 07/17/2023, 4:27 PM    Dictation software may have been used to generate the above note. Typos  may occur and escape review in typed/dictated notes. Please contact Dr Lyn Hollingshead directly for clarity if needed.  Staff may message me via secure chat in Epic  but this may not receive an immediate response,  please page me for urgent matters!  If 7PM-7AM, please contact night coverage [email or web address]

## 2023-07-17 NOTE — ED Provider Notes (Signed)
[facility] EMERGENCY DEPARTMENT AT [facility] Provider Note   CSN: [id number] Arrival date & time: 07/17/23  1232     History  Chief Complaint  Patient presents with   Abnormal Labs    Richard [name] is a 57 y.o. male.  Patient is a 57 year old male with a past medical history of cirrhosis presenting to the emergency department with abnormal labs.  Patient states that he has been feeling increasing fatigue and shortness of breath as well as feeling cold.  He states he was seen by his primary doctor who performed labs and called him today saying that he is anemic and recommended that he come to the emergency department.  Patient states that he has had no pain or bleeding and has not had any black or bloody stools.  States he has not drink for the last year.  The history is provided by the patient.       Home Medications Prior to Admission medications   Medication Sig Start Date End Date Taking? Authorizing Provider  albuterol (VENTOLIN HFA) 108 (90 Base) MCG/ACT inhaler Inhale 1-2 puffs into the lungs every 6 (six) hours as needed for wheezing or shortness of breath. 07/05/23   Carlisle Beers, FNP  benzonatate (TESSALON) 100 MG capsule Take 1 capsule (100 mg total) by mouth every 8 (eight) hours. 07/05/23   Carlisle Beers, FNP  ferrous gluconate (FERGON) 324 MG tablet Take 1 tablet (324 mg total) by mouth 2 (two) times daily with a meal. Patient not taking: Reported on 05/29/2023 05/11/23   Anders Simmonds, PA-C  folic acid (FOLVITE) 1 MG tablet Take 1 tablet (1 mg total) by mouth daily. Patient not taking: Reported on 05/29/2023 10/02/22   Glade Lloyd, MD  furosemide (LASIX) 40 MG tablet Take 1 tablet (40 mg total) by mouth daily. 05/10/23   Anders Simmonds, PA-C  gabapentin (NEURONTIN) 300 MG capsule Take 1 capsule (300 mg total) by mouth 3 (three) times daily. 05/29/23   Hoy Register, MD  lactulose (CHRONULAC) 10 GM/15ML solution Take 30 mLs (20 g  total) by mouth 2 (two) times daily. 05/10/23   Anders Simmonds, PA-C  methocarbamol (ROBAXIN) 500 MG tablet Take 1 tablet (500 mg total) by mouth every 8 (eight) hours as needed for muscle spasms. 05/10/23   Anders Simmonds, PA-C  mirtazapine (REMERON) 15 MG tablet Take 1 tablet (15 mg total) by mouth at bedtime. 05/29/23   Hoy Register, MD  naloxone [facility]) nasal spray 4 mg/0.1 mL Place 1 spray into the nose as needed (accidental overdose). Patient not taking: Reported on 05/29/2023 07/06/22   [provider]  pantoprazole (PROTONIX) 40 MG tablet TAKE 1 TABLET(40 MG) BY MOUTH DAILY 06/08/23   Hoy Register, MD  potassium chloride SA (KLOR-CON M) 20 MEQ tablet Take 1 tablet (20 mEq total) by mouth daily. Patient not taking: Reported on 05/29/2023 02/07/23   Hoy Register, MD  propranolol (INDERAL) 20 MG tablet Take 1 tablet (20 mg total) by mouth 2 (two) times daily. Patient not taking: Reported on 05/29/2023 11/21/22   Osvaldo Shipper, MD  spironolactone (ALDACTONE) 100 MG tablet Take 1 tablet (100 mg total) by mouth daily. Patient not taking: Reported on 05/29/2023 05/10/23 08/08/23  Anders Simmonds, PA-C  thiamine 100 MG tablet Take 1 tablet (100 mg total) by mouth daily. Patient not taking: Reported on 05/29/2023 11/06/21   Erick Blinks, MD      Allergies    Aspirin and  Penicillins    Review of Systems   Review of Systems  Physical Exam Updated Vital Signs BP 125/62 (BP Location: Right Arm)   Pulse 82   Temp 98.3 F (36.8 C) (Oral)   Resp (!) 22   SpO2 100%  Physical Exam Vitals and nursing note reviewed. Exam conducted with a chaperone present.  Constitutional:      General: He is not in acute distress.    Appearance: Normal appearance.  HENT:     Head: Normocephalic and atraumatic.     Nose: Nose normal.     Mouth/Throat:     Mouth: Mucous membranes are moist.     Pharynx: Oropharynx is clear.  Eyes:     Extraocular Movements: Extraocular movements intact.      Conjunctiva/sclera: Conjunctivae normal.  Cardiovascular:     Rate and Rhythm: Normal rate and regular rhythm.     Heart sounds: Normal heart sounds.  Pulmonary:     Effort: Pulmonary effort is normal.     Breath sounds: Normal breath sounds.  Abdominal:     General: Abdomen is flat.     Palpations: Abdomen is soft.     Tenderness: There is no abdominal tenderness.  Genitourinary:    Rectum: Guaiac result negative (Light brown stool).  Musculoskeletal:        General: Normal range of motion.     Cervical back: Normal range of motion.  Skin:    General: Skin is warm and dry.  Neurological:     General: No focal deficit present.     Mental Status: He is alert and oriented to person, place, and time.  Psychiatric:        Mood and Affect: Mood normal.        Behavior: Behavior normal.     ED Results / Procedures / Treatments   Labs (all labs ordered are listed, but only abnormal results are displayed) Labs Reviewed  CBC WITH DIFFERENTIAL/PLATELET - Abnormal; Notable for the following components:      Result Value   WBC 2.9 (*)    RBC 2.71 (*)    Hemoglobin 5.1 (*)    HCT 19.1 (*)    MCV 70.5 (*)    MCH 18.8 (*)    MCHC 26.7 (*)    RDW 20.1 (*)    Platelets 123 (*)    Neutro Abs 1.3 (*)    All other components within normal limits  COMPREHENSIVE METABOLIC PANEL - Abnormal; Notable for the following components:   CO2 21 (*)    BUN <5 (*)    Calcium 8.6 (*)    Albumin 2.8 (*)    Alkaline Phosphatase 132 (*)    Total Bilirubin 1.6 (*)    All other components within normal limits  PROTIME-INR  VITAMIN B12  FOLATE  IRON AND TIBC  FERRITIN  RETICULOCYTES  POC OCCULT BLOOD, ED  TYPE AND SCREEN  PREPARE RBC (CROSSMATCH)    EKG EKG Interpretation Date/Time:  Monday July 17 2023 14:20:58 EDT Ventricular Rate:  80 PR Interval:  172 QRS Duration:  92 QT Interval:  376 QTC Calculation: 434 R Axis:   56  Text Interpretation: Sinus rhythm Low voltage,  precordial leads No significant change since last tracing Confirmed by Elayne Snare ([id number]) on 07/17/2023 2:48:32 PM  Radiology No results found.  Procedures .Critical Care  Performed by: Rexford Maus, DO Authorized by: Rexford Maus, DO   Critical care provider statement:    Critical care  time (minutes):  30   Critical care time was exclusive of:  Separately billable procedures and treating other patients   Critical care was necessary to treat or prevent imminent or life-threatening deterioration of the following conditions:  Circulatory failure   Critical care was time spent personally by me on the following activities:  Development of treatment plan with patient or surrogate, evaluation of patient's response to treatment, discussions with consultants, examination of patient, obtaining history from patient or surrogate, ordering and performing treatments and interventions, ordering and review of laboratory studies, ordering and review of radiographic studies, pulse oximetry, re-evaluation of patient's condition and review of old charts   I assumed direction of critical care for this patient from another provider in my specialty: no     Care discussed with: admitting provider       Medications Ordered in ED Medications  0.9 %  sodium chloride infusion (Manually program via Guardrails IV Fluids) (has no administration in time range)    ED Course/ Medical Decision Making/ A&P                                 Medical Decision Making This patient presents to the ED with chief complaint(s) of anemia with pertinent past medical history of cirrhosis which further complicates the presenting complaint. The complaint involves an extensive differential diagnosis and also carries with it a high risk of complications and morbidity.    The differential diagnosis includes symptomatic anemia, GI bleed, coagulopathy, arrhythmia   Additional history obtained: Additional history  obtained from N/A Records reviewed previous admission documents -last endoscopy in February 2024 with esophageal varices that were actively bleeding  ED Course and Reassessment: On patient's arrival he is hemodynamically stable in no acute distress.  The patient was initially evaluated in triage and had labs performed that showed pancytopenia with a hemoglobin of 5.1.  Hemoccult testing was performed and showed Hemoccult negative brown stool.  The patient was consented for transfusion of 2 units of PRBCs.  He will have anemia panel sent.  The patient will require admission for transfusion and symptomatic anemia.  Independent labs interpretation:  The following labs were independently interpreted: Pancytopenia with hemoglobin of 5.1 from baseline of 7-8  Independent visualization of imaging: -N/A  Consultation: - Consulted or discussed management/test interpretation w/ external professional: hospitalist  Consideration for admission or further workup: Requires admission for symptomatic anemia Social Determinants of health: N/A    Amount and/or Complexity of Data Reviewed Labs: ordered.  Risk Prescription drug management. Decision regarding hospitalization.          Final Clinical Impression(s) / ED Diagnoses Final diagnoses:  Symptomatic anemia    Rx / DC Orders ED Discharge Orders     None         Rexford Maus, DO 07/17/23 1450

## 2023-07-18 DIAGNOSIS — I10 Essential (primary) hypertension: Secondary | ICD-10-CM | POA: Diagnosis not present

## 2023-07-18 DIAGNOSIS — Z79899 Other long term (current) drug therapy: Secondary | ICD-10-CM | POA: Diagnosis not present

## 2023-07-18 DIAGNOSIS — D638 Anemia in other chronic diseases classified elsewhere: Secondary | ICD-10-CM | POA: Diagnosis not present

## 2023-07-18 DIAGNOSIS — D509 Iron deficiency anemia, unspecified: Secondary | ICD-10-CM | POA: Diagnosis present

## 2023-07-18 DIAGNOSIS — D649 Anemia, unspecified: Secondary | ICD-10-CM | POA: Diagnosis not present

## 2023-07-18 DIAGNOSIS — Z8711 Personal history of peptic ulcer disease: Secondary | ICD-10-CM | POA: Diagnosis not present

## 2023-07-18 DIAGNOSIS — D61818 Other pancytopenia: Secondary | ICD-10-CM

## 2023-07-18 DIAGNOSIS — Z6820 Body mass index (BMI) 20.0-20.9, adult: Secondary | ICD-10-CM | POA: Diagnosis not present

## 2023-07-18 DIAGNOSIS — K7030 Alcoholic cirrhosis of liver without ascites: Secondary | ICD-10-CM | POA: Diagnosis present

## 2023-07-18 DIAGNOSIS — K219 Gastro-esophageal reflux disease without esophagitis: Secondary | ICD-10-CM | POA: Diagnosis present

## 2023-07-18 DIAGNOSIS — Z886 Allergy status to analgesic agent status: Secondary | ICD-10-CM | POA: Diagnosis not present

## 2023-07-18 DIAGNOSIS — D508 Other iron deficiency anemias: Secondary | ICD-10-CM

## 2023-07-18 DIAGNOSIS — E43 Unspecified severe protein-calorie malnutrition: Secondary | ICD-10-CM

## 2023-07-18 DIAGNOSIS — Z9049 Acquired absence of other specified parts of digestive tract: Secondary | ICD-10-CM | POA: Diagnosis not present

## 2023-07-18 DIAGNOSIS — Z880 Allergy status to penicillin: Secondary | ICD-10-CM | POA: Diagnosis not present

## 2023-07-18 DIAGNOSIS — F1010 Alcohol abuse, uncomplicated: Secondary | ICD-10-CM | POA: Diagnosis present

## 2023-07-18 LAB — CBC
HCT: 22.5 % — ABNORMAL LOW (ref 39.0–52.0)
Hemoglobin: 6.8 g/dL — CL (ref 13.0–17.0)
MCH: 21.3 pg — ABNORMAL LOW (ref 26.0–34.0)
MCHC: 30.2 g/dL (ref 30.0–36.0)
MCV: 70.5 fL — ABNORMAL LOW (ref 80.0–100.0)
Platelets: 114 10*3/uL — ABNORMAL LOW (ref 150–400)
RBC: 3.19 MIL/uL — ABNORMAL LOW (ref 4.22–5.81)
RDW: 21.3 % — ABNORMAL HIGH (ref 11.5–15.5)
WBC: 3.5 10*3/uL — ABNORMAL LOW (ref 4.0–10.5)
nRBC: 0 % (ref 0.0–0.2)

## 2023-07-18 LAB — HEMOGLOBIN AND HEMATOCRIT, BLOOD
HCT: 30 % — ABNORMAL LOW (ref 39.0–52.0)
Hemoglobin: 9.2 g/dL — ABNORMAL LOW (ref 13.0–17.0)

## 2023-07-18 LAB — COMPREHENSIVE METABOLIC PANEL
ALT: 16 U/L (ref 0–44)
AST: 26 U/L (ref 15–41)
Albumin: 2.4 g/dL — ABNORMAL LOW (ref 3.5–5.0)
Alkaline Phosphatase: 106 U/L (ref 38–126)
Anion gap: 9 (ref 5–15)
BUN: <5 mg/dL — ABNORMAL LOW (ref 6–20)
CO2: 21 mmol/L — ABNORMAL LOW (ref 22–32)
Calcium: 8.7 mg/dL — ABNORMAL LOW (ref 8.9–10.3)
Chloride: 105 mmol/L (ref 98–111)
Creatinine, Ser: 0.78 mg/dL (ref 0.61–1.24)
GFR, Estimated: >60 mL/min (ref >60)
Glucose, Bld: 77 mg/dL (ref 70–99)
Potassium: 3.6 mmol/L (ref 3.5–5.1)
Sodium: 135 mmol/L (ref 135–145)
Total Bilirubin: 5.2 mg/dL — ABNORMAL HIGH (ref 0.3–1.2)
Total Protein: 6.2 g/dL — ABNORMAL LOW (ref 6.5–8.1)

## 2023-07-18 LAB — PREPARE RBC (CROSSMATCH)

## 2023-07-18 LAB — RETICULOCYTES
Immature Retic Fract: 29.2 % — ABNORMAL HIGH (ref 2.3–15.9)
RBC.: 3.21 MIL/uL — ABNORMAL LOW (ref 4.22–5.81)
Retic Count, Absolute: 36.9 10*3/uL (ref 19.0–186.0)
Retic Ct Pct: 1.2 % (ref 0.4–3.1)

## 2023-07-18 MED ORDER — SODIUM CHLORIDE 0.9% IV SOLUTION: Freq: Once | INTRAVENOUS | Status: AC

## 2023-07-18 MED ORDER — SODIUM CHLORIDE 0.9 % IV SOLN
125.0000 mg | Freq: Every day | INTRAVENOUS | Status: DC
  Administered 2023-07-18: 125 mg via INTRAVENOUS
  Filled 2023-07-18 (×3): qty 10

## 2023-07-18 NOTE — Plan of Care (Signed)

## 2023-07-18 NOTE — Progress Notes (Signed)
PROGRESS NOTE    Richard [name]  ZOX:[id number] DOB: 1966-04-10 DOA: 07/17/2023 PCP: Hoy Register, MD    Brief Narrative:   Richard [name] is a 57 y.o. male with past medical history significant for EtOH cirrhosis, alcohol use disorder with no reported intake over the past year, GERD, history of colon cancer s/p partial colectomy, SVT, dilated cardiomyopathy with LVEF 60 to 65%, depression, blindness left eye who presented to [facility] ED on 10/14 with complaints of abnormal labs drawn by pain management provider.  Patient reports fatigue; patient had labs drawn by pain management clinic and patient was informed he had significant anemia who advised him to come to the ED for further evaluation.  Denies blood in stool or urine, no dark stools.  Denies constipation/diarrhea, no dizziness but does have occasional lightheadedness.  In the ED, temperature 98.3 F, HR 82, RR 22, BP 125/62.  SpO2 100% on room air.  WBC 2.9, hemoglobin 5.1, platelet count 123.  Sodium 137, potassium of 3.5, chloride 108, CO2 21, glucose 89, BUN less than 5, creatinine 0.71.  AST 31, ALT 17, total bilirubin 1.6.  INR 1.8.  FOBT negative.  2 unit PRBC ordered.  TRH consulted for admission for further evaluation and management of symptomatic anemia.  Assessment & Plan:   Symptomatic anemia pancytopenia Iron deficiency anemia Patient presenting to the ED by request of his pain management clinic after abnormal labs outpatient.  Patient was noted to have a hemoglobin level of 5.1 (baseline 7.5 - 8.2 over the past 1 yr), denies hematemesis, blood/dark tarry stool.  Patient does have underlying EtOH cirrhosis is which is likely a contributing factor with his pancytopenia.  Anemia panel with iron 12, TIBC 532, ferritin 3, B12 346, folate 12.8. -- Hgb 5.1>6.8 -- s/p 2u pRBC 10/14  -- transfuse 1 u pRBC today -- IV iron x 2 -- Repeat H&H 2 hours following transfusion -- CBC daily  History of EtOH cirrhosis AST/ALT  within normal limits, bilirubin slightly elevated 1.8.  Reports has not used alcohol in 1 year -- Spironolactone 12.5 mg p.o. daily -- Furosemide 40 mg p.o. daily -- Repeat CMP in a.m. -- Outpatient follow-up with gastroenterology  History of SVT -- Propranolol 10 mg p.o. twice daily  GERD -- Continue PPI  Alcohol use disorder Patient reports abstinence over the past year.  Continue to encourage.  Severe protein calorie malnutrition Body mass index is 20.07 kg/m.  -- Dietitian consult  DVT prophylaxis: SCDs Start: 07/17/23 1548    Code Status: Full Code Family Communication: No family present at bedside this morning  Disposition Plan:  Level of care: Telemetry Medical Status is: Observation The patient remains OBS appropriate and will d/c before 2 midnights.    Consultants:  None  Procedures:  None  Antimicrobials:  None   Subjective: Patient seen examined bedside, resting calmly.  Lying in bed.  Sleeping but easily arousable.  Hemoglobin up to 6.8 today.  Discussed with patient will need additional unit PRBC today which he is agreeable.  Also discussed anemia panel along with iron deficiency and will place order for her to receive IV iron today as well as tomorrow.  Patient reports his fatigue is improved.  No other specific complaints or concerns at this time.  Denies headache, no dizziness, no chest pain, no palpitations, no shortness of breath, no abdominal pain, no fever/chills/night sweats, no nausea cefonicid diarrhea, no focal weakness, no paresthesias.  No acute events overnight per nurse staff.  Objective:  Vitals:   07/18/23 0434 07/18/23 0755 07/18/23 0958 07/18/23 1031  BP: 110/65 112/67 112/69 104/61  Pulse: 70 72 67 69  Resp:  16 16 17   Temp: 98.2 F (36.8 C) 98.2 F (36.8 C) 98.2 F (36.8 C) 98 F (36.7 C)  TempSrc: Oral   Oral  SpO2: 99% 98% 97% 98%  Weight:      Height:        Intake/Output Summary (Last 24 hours) at 07/18/2023 1203 Last  data filed at 07/18/2023 0444 Gross per 24 hour  Intake 1610 ml  Output 450 ml  Net 1160 ml   Filed Weights   07/17/23 1524  Weight: 67.1 kg    Examination:  Physical Exam: GEN: NAD, alert and oriented x 3, chronically ill/thin/cachectic in appearance, appears older than stated age HEENT: NCAT, PERRL, EOMI, sclera clear, MMM PULM: CTAB w/o wheezes/crackles, normal respiratory effort, on room air CV: RRR w/o M/G/R GI: abd soft, NTND, NABS, no R/G/M MSK: no peripheral edema, muscle strength globally intact 5/5 bilateral upper/lower extremities NEURO: No focal neurological deficits PSYCH: normal mood/affect Integumentary: No concerning rashes/lesions/wounds noted on exposed skin surfaces.    Data Reviewed: I have personally reviewed following labs and imaging studies  CBC: Recent Labs  Lab 07/17/23 1245 07/18/23 0126  WBC 2.9* 3.5*  NEUTROABS 1.3*  --   HGB 5.1* 6.8*  HCT 19.1* 22.5*  MCV 70.5* 70.5*  PLT 123* 114*   Basic Metabolic Panel: Recent Labs  Lab 07/17/23 1245 07/18/23 0126  NA 137 135  K 3.5 3.6  CL 108 105  CO2 21* 21*  GLUCOSE 89 77  BUN <5* <5*  CREATININE 0.71 0.78  CALCIUM 8.6* 8.7*   GFR: Estimated Creatinine Clearance: 99 mL/min (by C-G formula based on SCr of 0.78 mg/dL). Liver Function Tests: Recent Labs  Lab 07/17/23 1245 07/18/23 0126  AST 31 26  ALT 17 16  ALKPHOS 132* 106  BILITOT 1.6* 5.2*  PROT 6.7 6.2*  ALBUMIN 2.8* 2.4*   No results for input(s): "LIPASE", "AMYLASE" in the last 168 hours. No results for input(s): "AMMONIA" in the last 168 hours. Coagulation Profile: Recent Labs  Lab 07/17/23 1531  INR 1.8*   Cardiac Enzymes: No results for input(s): "CKTOTAL", "CKMB", "CKMBINDEX", "TROPONINI" in the last 168 hours. BNP (last 3 results) No results for input(s): "PROBNP" in the last 8760 hours. HbA1C: No results for input(s): "HGBA1C" in the last 72 hours. CBG: No results for input(s): "GLUCAP" in the last 168  hours. Lipid Profile: No results for input(s): "CHOL", "HDL", "LDLCALC", "TRIG", "CHOLHDL", "LDLDIRECT" in the last 72 hours. Thyroid Function Tests: No results for input(s): "TSH", "T4TOTAL", "FREET4", "T3FREE", "THYROIDAB" in the last 72 hours. Anemia Panel: Recent Labs    07/17/23 1527 07/18/23 0126  VITAMINB12 346  --   FOLATE 12.8  --   FERRITIN 3*  --   TIBC 532*  --   IRON 12*  --   RETICCTPCT  --  1.2   Sepsis Labs: No results for input(s): "PROCALCITON", "LATICACIDVEN" in the last 168 hours.  No results found for this or any previous visit (from the past 240 hour(s)).       Radiology Studies: No results found.      Scheduled Meds:  folic acid  1 mg Oral Daily   furosemide  40 mg Oral Daily   gabapentin  300 mg Oral TID   lactulose  20 g Oral BID   mirtazapine  15 mg Oral  QHS   pantoprazole  40 mg Oral Daily   propranolol  10 mg Oral BID   spironolactone  12.5 mg Oral Daily   thiamine  100 mg Oral Daily   Continuous Infusions:  ferric gluconate (FERRLECIT) IVPB       LOS: 0 days    Time spent: 52 minutes spent on chart review, discussion with nursing staff, consultants, updating family and interview/physical exam; more than 50% of that time was spent in counseling and/or coordination of care.    Alvira Philips [place], DO [facility] Available via Epic secure chat 7am-7pm After these hours, please refer to coverage provider listed on [email or web address] 07/18/2023, 12:03 PM

## 2023-07-18 NOTE — Plan of Care (Signed)
Pt remains cont of bowel and bladder. Pain remains controlled.

## 2023-07-19 DIAGNOSIS — D638 Anemia in other chronic diseases classified elsewhere: Secondary | ICD-10-CM | POA: Diagnosis not present

## 2023-07-19 DIAGNOSIS — D649 Anemia, unspecified: Secondary | ICD-10-CM | POA: Diagnosis not present

## 2023-07-19 LAB — BPAM RBC
Blood Product Expiration Date: 202411062359
Blood Product Expiration Date: 202411062359
Blood Product Expiration Date: 202411062359
ISSUE DATE / TIME: 202410150957
ISSUE DATE / TIME: 202410141537
ISSUE DATE / TIME: 202410142045
Unit Type and Rh: 7300
Unit Type and Rh: 7300
Unit Type and Rh: 7300

## 2023-07-19 LAB — COMPREHENSIVE METABOLIC PANEL
ALT: 18 U/L (ref 0–44)
AST: 32 U/L (ref 15–41)
Albumin: 2.4 g/dL — ABNORMAL LOW (ref 3.5–5.0)
Alkaline Phosphatase: 107 U/L (ref 38–126)
Anion gap: 8 (ref 5–15)
BUN: 5 mg/dL — ABNORMAL LOW (ref 6–20)
CO2: 19 mmol/L — ABNORMAL LOW (ref 22–32)
Calcium: 8.7 mg/dL — ABNORMAL LOW (ref 8.9–10.3)
Chloride: 108 mmol/L (ref 98–111)
Creatinine, Ser: 0.82 mg/dL (ref 0.61–1.24)
GFR, Estimated: >60 mL/min (ref >60)
Glucose, Bld: 138 mg/dL — ABNORMAL HIGH (ref 70–99)
Potassium: 3.7 mmol/L (ref 3.5–5.1)
Sodium: 135 mmol/L (ref 135–145)
Total Bilirubin: 2.4 mg/dL — ABNORMAL HIGH (ref 0.3–1.2)
Total Protein: 6 g/dL — ABNORMAL LOW (ref 6.5–8.1)

## 2023-07-19 LAB — TYPE AND SCREEN
ABO/RH(D): B POS
Antibody Screen: NEGATIVE
Unit division: 0
Unit division: 0
Unit division: 0

## 2023-07-19 LAB — CBC
HCT: 30.9 % — ABNORMAL LOW (ref 39.0–52.0)
Hemoglobin: 9.2 g/dL — ABNORMAL LOW (ref 13.0–17.0)
MCH: 22.9 pg — ABNORMAL LOW (ref 26.0–34.0)
MCHC: 29.8 g/dL — ABNORMAL LOW (ref 30.0–36.0)
MCV: 76.9 fL — ABNORMAL LOW (ref 80.0–100.0)
Platelets: 119 10*3/uL — ABNORMAL LOW (ref 150–400)
RBC: 4.02 MIL/uL — ABNORMAL LOW (ref 4.22–5.81)
RDW: 22.8 % — ABNORMAL HIGH (ref 11.5–15.5)
WBC: 4.8 10*3/uL (ref 4.0–10.5)
nRBC: 0.4 % — ABNORMAL HIGH (ref 0.0–0.2)

## 2023-07-19 MED ORDER — PROPRANOLOL HCL 10 MG PO TABS: 10.0000 mg | ORAL_TABLET | Freq: Two times a day (BID) | ORAL | 0 refills | Status: DC

## 2023-07-19 MED ORDER — FERROUS GLUCONATE 324 (38 FE) MG PO TABS: 324.0000 mg | ORAL_TABLET | Freq: Two times a day (BID) | ORAL | 3 refills | Status: AC

## 2023-07-19 MED ORDER — SPIRONOLACTONE 25 MG PO TABS: 12.5000 mg | ORAL_TABLET | Freq: Every day | ORAL | 0 refills | Status: DC

## 2023-07-19 MED ORDER — FOLIC ACID 1 MG PO TABS: 1.0000 mg | ORAL_TABLET | Freq: Every day | ORAL | 0 refills | Status: AC

## 2023-07-19 MED ORDER — THIAMINE HCL 100 MG PO TABS: 100.0000 mg | ORAL_TABLET | Freq: Every day | ORAL | 1 refills | Status: DC

## 2023-07-19 MED ORDER — ACETAMINOPHEN ER 650 MG PO TBCR: 650.0000 mg | EXTENDED_RELEASE_TABLET | Freq: Three times a day (TID) | ORAL | 0 refills | Status: DC | PRN

## 2023-07-19 NOTE — Plan of Care (Signed)
Problem: Education: Goal: Knowledge of General Education information will improve Description: Including pain rating scale, medication(s)/side effects and non-pharmacologic comfort measures Outcome: Progressing   Problem: Coping: Goal: Level of anxiety will decrease Outcome: Progressing   Problem: Elimination: Goal: Will not experience complications related to bowel motility Outcome: Progressing

## 2023-07-19 NOTE — Discharge Summary (Signed)
Physician Discharge Summary   Patient: Richard [name] MRN: [id number] DOB: 1965-11-29  Admit date:     07/17/2023  Discharge date: 07/19/23  Discharge Physician: Loyce Dys   PCP: Hoy Register, MD    Recommendations at discharge:  Follow-up with primary care physician as well as outpatient GI   Discharge Diagnoses: Symptomatic anemia pancytopenia Iron deficiency anemia History of EtOH cirrhosis History of SVT GERD Alcohol use disorder Severe protein calorie malnutrition     Hospital Course: Richard [name] is a 57 y.o. male with past medical history significant for EtOH cirrhosis, alcohol use disorder with no reported intake over the past year, GERD, history of colon cancer s/p partial colectomy, SVT, dilated cardiomyopathy with LVEF 60 to 65%, depression, blindness left eye who presented to [facility] ED on 10/14 with complaints of abnormal labs drawn by pain management provider.  Patient reports fatigue; patient had labs drawn by pain management clinic and patient was informed he had significant anemia who advised him to come to the ED for further evaluation.  Denies blood in stool or urine, no dark stools.  Denies constipation/diarrhea, no dizziness but does have occasional lightheadedness.   In the ED, temperature 98.3 F, HR 82, RR 22, BP 125/62.  SpO2 100% on room air.  WBC 2.9, hemoglobin 5.1, platelet count 123.  Sodium 137, potassium of 3.5, chloride 108, CO2 21, glucose 89, BUN less than 5, creatinine 0.71.  AST 31, ALT 17, total bilirubin 1.6.  INR 1.8.  FOBT negative.  2 unit PRBC ordered.  TRH consulted for admission for further evaluation and management of symptomatic anemia.  During hospitalization patient hemoglobin stabilized no more findings of bleeding detected.  Patient have therefore been cleared for discharge home today and to follow-up with outpatient GI.    Consultants: None Procedures performed: None Disposition: Home Diet recommendation:  Cardiac  diet DISCHARGE MEDICATION: Allergies as of 07/19/2023       Reactions   Aspirin Other (See Comments)   Caused acid reflux   Penicillins Hives   Has patient had a PCN reaction causing immediate rash, facial/tongue/throat swelling, SOB or lightheadedness with hypotension: yes Has patient had a PCN reaction causing severe rash involving mucus membranes or skin necrosis: no Has patient had a PCN reaction that required hospitalization: no Has patient had a PCN reaction occurring within the last 10 years: no If all of the above answers are "NO", then may proceed with Cephalosporin use.        Medication List     STOP taking these medications    levofloxacin 500 MG tablet Commonly known as: LEVAQUIN   methocarbamol 500 MG tablet Commonly known as: ROBAXIN       TAKE these medications    acetaminophen 650 MG CR tablet Commonly known as: TYLENOL Take 1 tablet (650 mg total) by mouth every 8 (eight) hours as needed for pain. What changed: how much to take   albuterol 108 (90 Base) MCG/ACT inhaler Commonly known as: VENTOLIN HFA Inhale 1-2 puffs into the lungs every 6 (six) hours as needed for wheezing or shortness of breath.   ferrous gluconate 324 MG tablet Commonly known as: FERGON Take 1 tablet (324 mg total) by mouth 2 (two) times daily with a meal.   fluticasone 50 MCG/ACT nasal spray Commonly known as: FLONASE Place 1 spray into both nostrils daily as needed for allergies or rhinitis.   folic acid 1 MG tablet Commonly known as: FOLVITE Take 1 tablet (1 mg  total) by mouth daily.   furosemide 40 MG tablet Commonly known as: LASIX Take 1 tablet (40 mg total) by mouth daily.   gabapentin 300 MG capsule Commonly known as: NEURONTIN Take 1 capsule (300 mg total) by mouth 3 (three) times daily.   lactulose 10 GM/15ML solution Commonly known as: CHRONULAC Take 30 mLs (20 g total) by mouth 2 (two) times daily.   mirtazapine 15 MG tablet Commonly known as:  REMERON Take 1 tablet (15 mg total) by mouth at bedtime.   pantoprazole 40 MG tablet Commonly known as: PROTONIX TAKE 1 TABLET(40 MG) BY MOUTH DAILY   propranolol 10 MG tablet Commonly known as: INDERAL Take 1 tablet (10 mg total) by mouth 2 (two) times daily. What changed:  medication strength how much to take   spironolactone 25 MG tablet Commonly known as: ALDACTONE Take 0.5 tablets (12.5 mg total) by mouth daily. Start taking on: July 20, 2023 What changed:  medication strength how much to take   thiamine 100 MG tablet Commonly known as: VITAMIN B1 Take 1 tablet (100 mg total) by mouth daily.   Voltaren 1 % Gel Generic drug: diclofenac Sodium Apply 2 g topically 4 (four) times daily as needed (pain).        Follow-up Information     Cirigliano, Vito V, DO. Schedule an appointment as soon as possible for a visit.   Specialty: Gastroenterology Contact information: [address] [place] [address] [phone]                Discharge Exam: Ceasar Mons Weights   07/17/23 1524  Weight: 67.1 kg   GEN: NAD, alert and oriented x 3, chronically ill/thin/cachectic in appearance, appears older than stated age HEENT: NCAT, PERRL, EOMI, sclera clear, MMM PULM: CTAB w/o wheezes/crackles, normal respiratory effort, on room air CV: RRR w/o M/G/R GI: abd soft, NTND, NABS, no R/G/M MSK: no peripheral edema, muscle strength globally intact 5/5 bilateral upper/lower extremities NEURO: No focal neurological deficits PSYCH: normal mood/affect Integumentary: No concerning rashes/lesions/wounds noted on exposed skin surfaces.  Condition at discharge: good  The results of significant diagnostics from this hospitalization (including imaging, microbiology, ancillary and laboratory) are listed below for reference.   Imaging Studies: DG Chest 2 View  Result Date: 07/05/2023 CLINICAL DATA:  Cough EXAM: CHEST - 2 VIEW COMPARISON:  09/23/2022 FINDINGS: Streaky left lower  lobe opacity. No pleural effusion. Normal cardiac size. No pneumothorax IMPRESSION: Streaky left lower lobe opacity, possible atelectasis versus mild pneumonia. Electronically Signed   By: Jasmine Pang M.D.   On: 07/05/2023 18:57    Microbiology: Results for orders placed or performed during the hospital encounter of 07/05/23  SARS CORONAVIRUS 2 (TAT 6-24 HRS) Anterior Nasal Swab     Status: None   Collection Time: 07/05/23  5:55 PM   Specimen: Anterior Nasal Swab  Result Value Ref Range Status   SARS Coronavirus 2 NEGATIVE NEGATIVE Final    Comment: (NOTE) SARS-CoV-2 target nucleic acids are NOT DETECTED.  The SARS-CoV-2 RNA is generally detectable in upper and lower respiratory specimens during the acute phase of infection. Negative results do not preclude SARS-CoV-2 infection, do not rule out co-infections with other pathogens, and should not be used as the sole basis for treatment or other patient management decisions. Negative results must be combined with clinical observations, patient history, and epidemiological information. The expected result is Negative.  Fact Sheet for Patients: HairSlick.no  Fact Sheet for Healthcare Providers: quierodirigir.com  This test is  not yet approved or cleared by the [place] and  has been authorized for detection and/or diagnosis of SARS-CoV-2 by FDA under an Emergency Use Authorization (EUA). This EUA will remain  in effect (meaning this test can be used) for the duration of the COVID-19 declaration under Se ction 564(b)(1) of the Act, 21 U.S.C. section 360bbb-3(b)(1), unless the authorization is terminated or revoked sooner.  Performed at [facility], [address], [place], [place] [address]     Labs: CBC: Recent Labs  Lab 07/17/23 1245 07/18/23 0126 07/18/23 1610 07/19/23 0842  WBC 2.9* 3.5*  --  4.8  NEUTROABS 1.3*  --   --   --   HGB 5.1* 6.8* 9.2*  9.2*  HCT 19.1* 22.5* 30.0* 30.9*  MCV 70.5* 70.5*  --  76.9*  PLT 123* 114*  --  119*   Basic Metabolic Panel: Recent Labs  Lab 07/17/23 1245 07/18/23 0126 07/19/23 0633  NA 137 135 135  K 3.5 3.6 3.7  CL 108 105 108  CO2 21* 21* 19*  GLUCOSE 89 77 138*  BUN <5* <5* 5*  CREATININE 0.71 0.78 0.82  CALCIUM 8.6* 8.7* 8.7*   Liver Function Tests: Recent Labs  Lab 07/17/23 1245 07/18/23 0126 07/19/23 0633  AST 31 26 32  ALT 17 16 18   ALKPHOS 132* 106 107  BILITOT 1.6* 5.2* 2.4*  PROT 6.7 6.2* 6.0*  ALBUMIN 2.8* 2.4* 2.4*   CBG: No results for input(s): "GLUCAP" in the last 168 hours.  Discharge time spent:  35 minutes.  Signed: Loyce Dys, MD [facility] 07/19/2023

## 2023-07-19 NOTE — Progress Notes (Signed)
Discharge summary (AVS) packet provided to pt with instructions. Pt verbalized understanding of instructions. No complaints. Pt d/c to home as ordered, He is responsible for his ride.

## 2023-07-20 ENCOUNTER — Telehealth: Payer: Self-pay

## 2023-07-20 NOTE — Patient Instructions (Signed)
Visit Information  Thank you for taking time to visit with me today. Please don't hesitate to contact me if I can be of assistance to you before our next scheduled telephone appointment.  Our next appointment is by telephone on Oct 24 at 11:00am  Following is a copy of your care plan:   Goals Addressed             This Visit's Progress    Patient Stated       Current Barriers:  Chronic Disease Management support and education needs related to Alcoholic cirrhosis of the liver and Anemia of chronic disease  Non-adherence to prescribed medication regimen  RNCM Clinical Goal(s):  Patient will work with the Care Management team over the next 30 days to address Transition of Care Barriers: Medication access Medication Management Diet/Nutrition/Food Resources Support at home Provider appointments take all medications exactly as prescribed and will call provider for medication related questions as evidenced by decreased visits to ED, improvement in lab results such as hemoglobin/hematocrit levels indicating improvement in anemia, normal ammonia levels indicating adherence with taking lactulose demonstrate understanding of rationale for each prescribed medication as evidenced by verbalization of medication use/purpose with weekly medication review  through collaboration with RN Care manager, provider, and care team.   Interventions: Evaluation of current treatment plan related to  self management and patient's adherence to plan as established by provider   Health Maintenance Interventions:  (Status:  New goal.) Short Term Goal Patient interviewed about adult health maintenance status including  Colonoscopy    Depression screen    Falls risk assessment    Provided education about importance of follow up with PCP within 7-14 days of recent hospitalization. Urged patient to call PCP Dr. Baxter Flattery office today to make an appointment to see her. Urged patient to call Specialist  Gastroenterologist, Dr. Gae Bon Cirigliano's office today to make a follow up appointment as advised in the hospital discharge instructions.   Anemia/Bleeding Interventions:  (Status:  New goal.) Short Term Goal  Assessment of understanding of anemia/bleeding disorder diagnosis  Basic overview and discussion of anemia/bleeding disorder or acute disease state  Medications reviewed  Counseled on bleeding risk associated with Alcoholic cirrhosis of the liver and Anemia of chronic disease and importance of self-monitoring for signs/symptoms of bleeding Counseled on avoidance of NSAIDs due to increased bleeding risk Counseled on importance of regular laboratory monitoring as directed by provider Provided education about signs and symptoms of active bleeding such as stomach discomfort, coughing up blood or blood tinged secretions, bleeding from the gums/teeth, nosebleeds, increased bruising, blood in the urine/stool and/or if a traumatic injury occurs, regardless of severity of injury  Advised to call provider or 911 if active bleeding or signs and symptoms of active bleeding occur recommended promotion of rest and energy-conserving measures to manage fatigue, such as balancing activity with periods of rest encouraged strategies to prevent falls related to fatigue, weakness and dizziness; encouraged sitting before standing and using an assistive device encouraged optimal oral intake to support fluid balance and nutrition encouraged dietary changes to increase dietary intake of iron, Vitamin B12 and folic acid as advised/prescribed Screening for signs and symptoms of depression related to chronic disease state Assessed social determinant of health barriers  Lab Results  Component Value Date   WBC 4.8 07/19/2023   HGB 9.2 (L) 07/19/2023   HCT 30.9 (L) 07/19/2023   MCV 76.9 (L) 07/19/2023   PLT 119 (L) 07/19/2023    Patient Goals/Self-Care Activities: Participate in  Transition of Care Program/Attend  TOC scheduled calls Take all medications as prescribed Attend all scheduled provider appointments Call pharmacy for medication refills 3-7 days in advance of running out of medications Perform IADL's (shopping, preparing meals, housekeeping, managing finances) independently Call provider office for new concerns or questions   Follow Up Plan:  The patient has been provided with contact information for the care management team and has been advised to call with any health related questions or concerns.          Patient verbalizes understanding of instructions and care plan provided today and agrees to view in MyChart. Active MyChart status and patient understanding of how to access instructions and care plan via MyChart confirmed with patient.     The patient has been provided with contact information for the care management team and has been advised to call with any health related questions or concerns.   Please call the care guide team at [phone] if you need to cancel or reschedule your appointment.   Please call 1-800-273-TALK (toll free, 24 hour hotline) if you are experiencing a Mental Health or Behavioral Health Crisis or need someone to talk to.  Alyse Low, RN, BA, [facility], CRRN [facility] Coordinator, Transition of Care Ph # [phone]

## 2023-07-20 NOTE — Transitions of Care (Post Inpatient/ED Visit) (Signed)
07/20/2023  Name: Richard [name] MRN: [id number] DOB: September 16, 1966  Today's TOC FU Call Status: Today's TOC FU Call Status:: Successful TOC FU Call Completed TOC FU Call Complete Date: 07/20/23 Patient's Name and Date of Birth confirmed.  Transition Care Management Follow-up Telephone Call Date of Discharge: 07/19/23 Discharge Facility: [facility]) Type of Discharge: Inpatient Admission Primary Inpatient Discharge Diagnosis:: symptomatic anemia; Hx of alcoholic cirrhosis and anemia of chronic disease How have you been since you were released from the hospital?: Better Any questions or concerns?: Yes Patient Questions/Concerns:: Patient had several questions about his meds which he is going to be picking up at his pharmacy later today. We went over, in detail and even spelled out the most important meds he is taking including pantoprazole, folic acid, thiamine, iron, gabapentin and his diuretic meds furosemide and spironolactone. Will focus on medication adherence/compliance weekly x 4  Items Reviewed: Did you receive and understand the discharge instructions provided?: Yes (patient states he understands the DC instructions but weekly follow ups in TOC 30d program will be helpful to reinforce understanding) Medications obtained,verified, and reconciled?: Yes (Medications Reviewed) Any new allergies since your discharge?: No Dietary orders reviewed?: Yes Type of Diet Ordered:: cardiac diet, stressed low sodium intake Do you have support at home?: Yes Name of Support/Comfort Primary Source: mother  Medications Reviewed Today: Medications Reviewed Today     Reviewed by Marcos Eke, RN (Registered Nurse) on 07/20/23 at 1116  Med List Status: <None>   Medication Order Taking? Sig Documenting Provider Last Dose Status Informant  acetaminophen (TYLENOL) 650 MG CR tablet [id number] Yes Take 1 tablet (650 mg total) by mouth every 8 (eight) hours as needed for pain. Loyce Dys, MD Taking Active   albuterol (VENTOLIN HFA) 108 (90 Base) MCG/ACT inhaler [id number] Yes Inhale 1-2 puffs into the lungs every 6 (six) hours as needed for wheezing or shortness of breath. Carlisle Beers, FNP Taking Active Self  diclofenac Sodium (VOLTAREN) 1 % GEL [id number] Yes Apply 2 g topically 4 (four) times daily as needed (pain). [provider] Taking Active Self  ferrous gluconate (FERGON) 324 MG tablet [id number] Yes Take 1 tablet (324 mg total) by mouth 2 (two) times daily with a meal. Djan, Scarlette Calico, MD Taking Active   fluticasone (FLONASE) 50 MCG/ACT nasal spray [id number] Yes Place 1 spray into both nostrils daily as needed for allergies or rhinitis. [provider] Taking Active Self  folic acid (FOLVITE) 1 MG tablet [id number] Yes Take 1 tablet (1 mg total) by mouth daily. Loyce Dys, MD Taking Active   furosemide (LASIX) 40 MG tablet [id number] Yes Take 1 tablet (40 mg total) by mouth daily. Anders Simmonds, PA-C Taking Active Self  gabapentin (NEURONTIN) 300 MG capsule [id number] Yes Take 1 capsule (300 mg total) by mouth 3 (three) times daily. Hoy Register, MD Taking Active Self  lactulose (CHRONULAC) 10 GM/15ML solution [id number] Yes Take 30 mLs (20 g total) by mouth 2 (two) times daily. Anders Simmonds, PA-C Taking Active Self  mirtazapine (REMERON) 15 MG tablet [id number] Yes Take 1 tablet (15 mg total) by mouth at bedtime. Hoy Register, MD Taking Active Self  pantoprazole (PROTONIX) 40 MG tablet [id number] Yes TAKE 1 TABLET(40 MG) BY MOUTH DAILY Hoy Register, MD Taking Active Self  propranolol (INDERAL) 10 MG tablet [id number] Yes Take 1 tablet (10 mg total) by mouth 2 (two) times daily. Loyce Dys, MD Taking Active   spironolactone (ALDACTONE) 25  MG tablet [id number] Yes Take 0.5 tablets (12.5 mg total) by mouth daily. Loyce Dys, MD Taking Active   thiamine (VITAMIN B1) 100 MG tablet [id number] Yes Take 1 tablet (100 mg total) by mouth  daily. Loyce Dys, MD Taking Active             Home Care and Equipment/Supplies: Were Home Health Services Ordered?: No Any new equipment or medical supplies ordered?: No  Functional Questionnaire: Do you need assistance with bathing/showering or dressing?: No Do you need assistance with meal preparation?: No Do you need assistance with eating?: No Do you have difficulty maintaining continence: No Do you need assistance with getting out of bed/getting out of a chair/moving?: No Do you have difficulty managing or taking your medications?: Yes  Follow up appointments reviewed: PCP Follow-up appointment confirmed?: No MD Provider Line Number:[phone] Given: No Specialist Hospital Follow-up appointment confirmed?: No Reason Specialist Follow-Up Not Confirmed: Patient has Specialist Provider Number and will Call for Appointment Do you need transportation to your follow-up appointment?: No Do you understand care options if your condition(s) worsen?: Yes-patient verbalized understanding    Alyse Low, RN, BA, [facility], CRRN [facility] Population Health Care Management Coordinator, Transition of Care Ph # [phone]

## 2023-07-27 ENCOUNTER — Telehealth: Payer: Self-pay

## 2023-07-27 ENCOUNTER — Other Ambulatory Visit: Payer: Self-pay

## 2023-07-27 NOTE — Patient Outreach (Signed)
Care Management  Transitions of Care Program Managed Medicaid Transitions of Care week 2  07/27/2023 Name: Richard [name] MRN: [id number] DOB: 05-Jul-1966  Subjective: Richard [name] is a 57 y.o. year old male who is a primary care patient of Hoy Register, MD. The Care Management team was unable to reach the patient by phone to assess and address transitions of care needs.   Plan: Additional outreach attempts will be made to reach the patient enrolled in the [facility] Program (Post Inpatient/ED Visit).  Alyse Low, RN, BA, [facility], CRRN [facility] Coordinator, Transition of Care Ph # [phone]

## 2023-08-03 ENCOUNTER — Other Ambulatory Visit: Payer: Self-pay

## 2023-08-03 ENCOUNTER — Telehealth: Payer: Self-pay

## 2023-08-03 NOTE — Patient Outreach (Signed)
Care Management  Transitions of Care Program Managed Medicaid Transitions of Care week 3  08/03/2023 Name: Ansley Sahli MRN: [id number] DOB: [birth year]  Subjective: Kjell Kielar is a 57 y.o. year old male who is a primary care patient of Hoy Register, MD. The Care Management team was unable to reach the patient by phone to assess and address transitions of care needs.   Plan: Additional outreach attempts will be made to reach the patient enrolled in the [facility] Program (Post Inpatient/ED Visit).  Alyse Low, RN, BA, [facility], CRRN [facility] Coordinator, Transition of Care Ph # [phone]

## 2023-08-10 ENCOUNTER — Telehealth: Payer: Self-pay

## 2023-08-10 ENCOUNTER — Other Ambulatory Visit: Payer: Self-pay

## 2023-08-10 NOTE — Patient Outreach (Signed)
  Care Management  Transitions of Care Program Managed Medicaid Transitions of Care week 3  08/10/2023 Name: Richard [name] MRN: [id number] DOB: 1966-08-20  Subjective: Richard [name] is a 57 y.o. year old male who is a primary care patient of Hoy Register, MD. The Care Management team was unable to reach the patient by phone to assess and address transitions of care needs.   Plan: Additional outreach attempts will be made to reach the patient enrolled in the [facility] Program (Post Inpatient/ED Visit).  Alyse Low, RN, BA, [facility], CRRN [facility] Coordinator, Transition of Care Ph # [phone]

## 2023-08-17 ENCOUNTER — Telehealth: Payer: Self-pay

## 2023-08-17 ENCOUNTER — Other Ambulatory Visit: Payer: Self-pay

## 2023-08-17 NOTE — Patient Outreach (Signed)
  Care Management  Transitions of Care Program Managed Medicaid Transitions of Care week 4  08/17/2023 Name: [name] MRN: [id number] DOB: 1966/05/28  Subjective: Richard [name] is a 57 y.o. year old male who is a primary care patient of Hoy Register, MD. The Care Management team spoke with patient by telephone to assess and address transitions of care needs.   Plan: Additional outreach attempts will be made to reach the patient enrolled in the [facility] Program (Post Inpatient/ED Visit).  Alyse Low, RN, BA, [facility], CRRN [facility] Coordinator, Transition of Care Ph # [phone]

## 2023-08-18 ENCOUNTER — Telehealth: Payer: Self-pay

## 2023-08-18 ENCOUNTER — Other Ambulatory Visit: Payer: Self-pay

## 2023-08-18 NOTE — Patient Outreach (Signed)
  Care Management  Transitions of Care Program Managed Medicaid Transitions of Care 30d program outreach  08/18/2023 Name: Richard [name] MRN: [id number] DOB: 16-Aug-1966  Subjective: Richard [name] is a 58 y.o. year old male who is a primary care patient of Hoy Register, MD. The Care Management team was unable to reach the patient by phone to assess and address transitions of care needs.   Plan: Additional outreach attempts will be made to reach the patient enrolled in the [facility] Program (Post Inpatient/ED Visit).  Alyse Low, RN, BA, [facility], CRRN [facility] Coordinator, Transition of Care Ph # [phone]

## 2023-08-23 ENCOUNTER — Telehealth: Payer: Self-pay

## 2023-08-23 ENCOUNTER — Other Ambulatory Visit: Payer: Self-pay

## 2023-08-23 NOTE — Patient Outreach (Signed)
  Care Management  Transitions of Care Program Managed Medicaid Transitions of Care week 4  08/23/2023 Name: Maveric Piltz MRN: [id number] DOB: [birth year]  Subjective: Jamah Keilty is a 57 y.o. year old male who is a primary care patient of Hoy Register, MD. The Care Management team was unable to reach the patient by phone to assess and address transitions of care needs.   Plan: Additional outreach attempts will be made to reach the patient enrolled in the [facility] Program (Post Inpatient/ED Visit).  Alyse Low, RN, BA, [facility], CRRN [facility] Coordinator, Transition of Care Ph # [phone]

## 2023-08-24 ENCOUNTER — Other Ambulatory Visit: Payer: Self-pay

## 2023-08-24 ENCOUNTER — Telehealth: Payer: Self-pay

## 2023-08-24 NOTE — Patient Outreach (Signed)
  Care Management  Transitions of Care Program Managed Medicaid Transitions of Care week 4  08/24/2023 Name: Richard [name] MRN: [id number] DOB: [birth year]  Subjective: Richard [name] is a 57 y.o. year old male who is a primary care patient of Hoy Register, MD. The Care Management team was unable to reach the patient by phone to assess and address transitions of care needs.   Plan: No further outreach attempts will be made at this time.  We have been unable to reach the patient. 11/21:final attempt to re-engage patient who agreed to Transition of CARE 30DAY PROGRAM - unable to reach last 3 weeks  Alyse Low, RN, BA, [facility], CRRN [facility] Coordinator, Transition of Care Ph # [phone]

## 2023-09-15 ENCOUNTER — Other Ambulatory Visit (INDEPENDENT_AMBULATORY_CARE_PROVIDER_SITE_OTHER): Payer: Medicaid Other

## 2023-09-15 ENCOUNTER — Ambulatory Visit: Payer: Medicaid Other | Admitting: Internal Medicine

## 2023-09-15 ENCOUNTER — Encounter: Payer: Self-pay | Admitting: Internal Medicine

## 2023-09-15 VITALS — BP 106/60 | HR 80 | Ht 70.0 in | Wt 154.0 lb

## 2023-09-15 DIAGNOSIS — K766 Portal hypertension: Secondary | ICD-10-CM

## 2023-09-15 DIAGNOSIS — I426 Alcoholic cardiomyopathy: Secondary | ICD-10-CM

## 2023-09-15 DIAGNOSIS — K769 Liver disease, unspecified: Secondary | ICD-10-CM | POA: Diagnosis not present

## 2023-09-15 DIAGNOSIS — Z8719 Personal history of other diseases of the digestive system: Secondary | ICD-10-CM

## 2023-09-15 DIAGNOSIS — K7030 Alcoholic cirrhosis of liver without ascites: Secondary | ICD-10-CM

## 2023-09-15 DIAGNOSIS — Z85038 Personal history of other malignant neoplasm of large intestine: Secondary | ICD-10-CM

## 2023-09-15 DIAGNOSIS — F10239 Alcohol dependence with withdrawal, unspecified: Secondary | ICD-10-CM

## 2023-09-15 DIAGNOSIS — F1020 Alcohol dependence, uncomplicated: Secondary | ICD-10-CM

## 2023-09-15 DIAGNOSIS — F17210 Nicotine dependence, cigarettes, uncomplicated: Secondary | ICD-10-CM

## 2023-09-15 DIAGNOSIS — D649 Anemia, unspecified: Secondary | ICD-10-CM | POA: Diagnosis not present

## 2023-09-15 DIAGNOSIS — G621 Alcoholic polyneuropathy: Secondary | ICD-10-CM

## 2023-09-15 DIAGNOSIS — I8500 Esophageal varices without bleeding: Secondary | ICD-10-CM

## 2023-09-15 LAB — COMPREHENSIVE METABOLIC PANEL
ALT: 12 U/L (ref 0–53)
AST: 22 U/L (ref 0–37)
Albumin: 3.6 g/dL (ref 3.5–5.2)
Alkaline Phosphatase: 171 U/L — ABNORMAL HIGH (ref 39–117)
BUN: 7 mg/dL (ref 6–23)
CO2: 24 meq/L (ref 19–32)
Calcium: 8.9 mg/dL (ref 8.4–10.5)
Chloride: 105 meq/L (ref 96–112)
Creatinine, Ser: 0.84 mg/dL (ref 0.40–1.50)
GFR: 97.98 mL/min (ref >60.00)
Glucose, Bld: 78 mg/dL (ref 70–99)
Potassium: 3.3 meq/L — ABNORMAL LOW (ref 3.5–5.1)
Sodium: 137 meq/L (ref 135–145)
Total Bilirubin: 1.4 mg/dL — ABNORMAL HIGH (ref 0.2–1.2)
Total Protein: 7.1 g/dL (ref 6.0–8.3)

## 2023-09-15 LAB — CBC WITH DIFFERENTIAL/PLATELET
Absolute Lymphocytes: 1532 {cells}/uL (ref 850–3900)
Absolute Monocytes: 785 {cells}/uL (ref 200–950)
Basophils Absolute: 61 {cells}/uL (ref 0–200)
Basophils Relative: 1.3 %
Eosinophils Absolute: 132 {cells}/uL (ref 15–500)
Eosinophils Relative: 2.8 %
HCT: 31.5 % — ABNORMAL LOW (ref 38.5–50.0)
Hemoglobin: 9.5 g/dL — ABNORMAL LOW (ref 13.2–17.1)
MCH: 22.3 pg — ABNORMAL LOW (ref 27.0–33.0)
MCHC: 30.2 g/dL — ABNORMAL LOW (ref 32.0–36.0)
MCV: 73.9 fL — ABNORMAL LOW (ref 80.0–100.0)
Monocytes Relative: 16.7 %
Neutro Abs: 2190 {cells}/uL (ref 1500–7800)
Neutrophils Relative %: 46.6 %
Platelets: 131 10*3/uL — ABNORMAL LOW (ref 140–400)
RBC: 4.26 10*6/uL (ref 4.20–5.80)
RDW: 18.7 % — ABNORMAL HIGH (ref 11.0–15.0)
Total Lymphocyte: 32.6 %
WBC: 4.7 10*3/uL (ref 3.8–10.8)

## 2023-09-15 LAB — PROTIME-INR
INR: 1.7 {ratio} — ABNORMAL HIGH (ref 0.8–1.0)
Prothrombin Time: 18 s — ABNORMAL HIGH (ref 9.6–13.1)

## 2023-09-15 MED ORDER — IRON (FERROUS SULFATE) 325 (65 FE) MG PO TABS: 1.0000 | ORAL_TABLET | Freq: Two times a day (BID) | ORAL | 6 refills | Status: DC

## 2023-09-15 NOTE — Patient Instructions (Signed)
We have sent the following medications to your pharmacy for you to pick up at your convenience:  Iron Sulfate.  You will be contacted by [facility] Scheduling in the next 2 days to arrange an ultrasound.  The number on your caller ID will be [phone], please answer when they call.  If you have not heard from them in 2 days please call [phone] to schedule.     I will send a referral to the liver clinic - they will call you to schedule an appointment.  You have been scheduled for an endoscopy. Please follow written instructions given to you at your visit today.  If you use inhalers (even only as needed), please bring them with you on the day of your procedure.  If you take any of the following medications, they will need to be adjusted prior to your procedure:   DO NOT TAKE 7 DAYS PRIOR TO TEST- Trulicity (dulaglutide) Ozempic, Wegovy (semaglutide) Mounjaro (tirzepatide) Bydureon Bcise (exanatide extended release)  DO NOT TAKE 1 DAY PRIOR TO YOUR TEST Rybelsus (semaglutide) Adlyxin (lixisenatide) Victoza (liraglutide) Byetta (exanatide) ___________________________________________________________________________

## 2023-09-15 NOTE — Progress Notes (Addendum)
HISTORY OF PRESENT ILLNESS:  Richard [name] is a 57 y.o. male with chronic alcoholism and multiple chronic complications of alcoholism including hepatic cirrhosis with portal hypertension and bleeding esophageal varices, alcoholic hepatitis, alcoholic pancreatitis, alcohol withdrawal and delirium, alcoholic cardiomyopathy, alcoholic painful neuropathy.  He also has a history of drug abuse and chronic tobacco abuse.  Other issues include history of colon cancer with prior resection.  He is sent today by his primary care provider regarding management of his liver disease.  The patient has been lost to follow-up.  He has innumerable complaints.  He has multiple questions.  I have not seen the patient in the office since September 2021.  He has seen our physician assistants on several occasions.  The patient was hospitalized July 17, 2023 through July 19, 2023 regarding symptomatic anemia.  Hemoglobin on admission was 5.1.  He was transfused.  Discharge hemoglobin 9.2.  Other labs included low albumin 2.8, bilirubin 1.6, creatinine 0.71.  He was discharged home and told to follow-up with GI.  CT scan September 23, 2022 revealed evidence of cirrhosis, partial colectomy, small volume ascites, gallstones.  Last ultrasound February 2014 revealed unremarkable hepatic vascular Doppler evaluation.  Cirrhotic liver and ascites noted.  Patient tells me that his principal complaint is painful neuropathy of his feet.  He also complains of abdominal bloating, shortness of breath, and fatigue.  He tells me that he has not had alcohol in 18 months.  He inquires about liver transplant evaluation.  He continues to smoke.  I reviewed his medications.  The following items are on his list but he is not taking: Albuterol inhaler, ferrous gluconate, folic acid, Remeron, propranolol, and thiamine.  He does tell me that he is taking furosemide 40 mg daily, Aldactone 25 mg daily, pantoprazole 40 mg daily, lactulose, and  gabapentin.  Takes 1 dose of lactulose per day has about 1 or 2 bowel movements per day.  Upper endoscopy with Dr. Tomasa Rand November 19, 2022 to evaluate hematemesis revealed esophageal varices which were banded.  Repeat EGD with banding in 4 weeks recommended.  This was not done.  His last colonoscopy March 2021 revealed prior colonic resection but was otherwise unremarkable.  REVIEW OF SYSTEMS:  All non-GI ROS negative except for sinus trouble, arthritis, back pain, visual change, cough, depression, fatigue, muscle cramps, night sweats, nosebleeds, lower extremity edema, excessive thirst, shortness of breath  Past Medical History:  Diagnosis Date   Abnormal nuclear cardiac imaging test    Acid reflux    Acute pancreatitis 08/14/2018   Alcohol withdrawal delirium (HCC) 08/20/2016   Alcoholic cardiomyopathy (HCC) 95/62/1308   Alcoholic hepatitis    Alcoholic ketoacidosis 11/13/2017   Ascites 12/13/2019   Aspiration pneumonia (HCC) 08/20/2016   Chest pain of uncertain etiology    Chronic systolic CHF (congestive heart failure) (HCC)    Cirrhosis (HCC)    Colon cancer (HCC)    DCM (dilated cardiomyopathy) (HCC)    Drug abuse (HCC) 01/07/2020   Elevated troponin 06/27/2018   ETOH abuse    Gastropathy 08/14/2018   Heme positive stool 11/13/2017   Hepatic steatosis 08/21/2016   High anion gap metabolic acidosis 11/05/2018   History of colon cancer    HTN (hypertension)    Hypertensive urgency 06/27/2018   Hypoglycemia 06/27/2018   Hypokalemia 01/07/2020   Hypomagnesemia    Hypophosphatemia    Lactic acidosis 08/20/2016   Leukocytosis 01/07/2020   Neuropathy    Pancreatitis 08/2018   Polyp of ascending colon  Prolonged Q-T interval on ECG    Prolonged QT interval    Protein-calorie malnutrition, severe 05/02/2018   PUD (peptic ulcer disease)    SBP (spontaneous bacterial peritonitis) (HCC) 01/07/2020   Sepsis (HCC) 08/20/2016   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 END[facility];  Service: Gastroenterology;;   catherization  2007   COLONOSCOPY WITH PROPOFOL N/A 12/14/2019   Procedure: COLONOSCOPY WITH PROPOFOL;  Surgeon: Napoleon Form, MD;  Location: [facility] ENDOSCOPY;  Service: Endoscopy;  Laterality: N/A;   ESOPHAGEAL BANDING  11/19/2022   Procedure: ESOPHAGEAL BANDING;  Surgeon: Jenel Lucks, MD;  Location: [facility] ENDOSCOPY;  Service: Gastroenterology;;   ESOPHAGOGASTRODUODENOSCOPY (EGD) WITH PROPOFOL N/A 12/14/2019   Procedure: ESOPHAGOGASTRODUODENOSCOPY (EGD) WITH PROPOFOL;  Surgeon: Napoleon Form, MD;  Location: [facility] ENDOSCOPY;  Service: Endoscopy;  Laterality: N/A;   ESOPHAGOGASTRODUODENOSCOPY (EGD) WITH PROPOFOL N/A 11/19/2022   Procedure: ESOPHAGOGASTRODUODENOSCOPY (EGD) WITH PROPOFOL;  Surgeon: Jenel Lucks, MD;  Location: [facility] ENDOSCOPY;  Service: Gastroenterology;  Laterality: N/A;   HERNIA REPAIR  1969   1 x at birth and at 57 years old   LAPAROSCOPIC SIGMOID COLECTOMY  2007   OPEN REDUCTION INTERNAL FIXATION (ORIF) HAND Right 2012   3rd  digit   POLYPECTOMY  12/14/2019   Procedure: POLYPECTOMY;  Surgeon: Napoleon Form, MD;  Location: [facility] ENDOSCOPY;  Service: Endoscopy;;    Social History Arne Cleveland Reas  reports that he has been smoking cigarettes. He has never used smokeless tobacco. He reports that he does not currently use alcohol after a past usage of about 2.0 standard drinks of alcohol per week. He reports current drug use. Drug: Marijuana.  family history includes Cancer in his sister; Colon cancer in his cousin and father; Hypertension in his mother; Kidney disease in his  sister.  Allergies  Allergen Reactions   Aspirin Other (See Comments)    Caused acid reflux   Penicillins Hives    Has patient had a PCN reaction causing immediate rash, facial/tongue/throat swelling, SOB or lightheadedness with hypotension: yes Has patient had a PCN reaction causing severe rash involving mucus membranes or skin necrosis: no Has patient had a PCN reaction that required hospitalization: no Has patient had a PCN reaction occurring within the last 10 years: no If all of the above answers are "NO", then may proceed with Cephalosporin use.        PHYSICAL EXAMINATION: Vital signs: BP 106/60 (BP Location: Left Arm, Patient Position: Sitting, Cuff Size: Normal)   Pulse 80   Ht 5\' 10"  (1.778 m)   Wt 154 lb (69.9 kg)   BMI 22.10 kg/m   Constitutional: Chronically ill-appearing, thin, no acute distress Psychiatric: alert and oriented x3, cooperative.  Mumbles Eyes: extraocular movements intact, anicteric, conjunctiva pink Mouth: oral pharynx moist, no lesions Neck: supple no lymphadenopathy Cardiovascular: heart regular rate and rhythm. Lungs: clear to auscultation bilaterally Abdomen: soft, nontender, nondistended, no obvious ascites, no peritoneal signs, normal bowel sounds, no organomegaly Rectal: Omitted Extremities: no clubbing, cyanosis, or lower extremity edema bilaterally Skin: no lesions on visible extremities Neuro: No focal deficits. No asterixis.    ASSESSMENT:  1.  Hepatic cirrhosis  with portal hypertension secondary to alcohol.  Radiology over the past year as outlined above 2.  History of bleeding esophageal varices.  Last endoscopy February 2024 3.  Multiple complications of alcoholism including liver disease, history of pancreatitis, cardiomyopathy, withdrawal, and neuropathy 4.  Chronic smoker 5.  Other general medical problems 6.  History of colon cancer status post resection 2007.  Last colonoscopy 2021   PLAN:  1.  Prescribed iron sulfate  325 mg p.o. twice daily 2.  Refill lactulose.  Take daily to achieve 3 bowel movements 3.  Stop smoking 4.  Abdominal ultrasound to rule out significant ascites and rule out hepatoma. 5.  Laboratories today including CBC, comprehensive metabolic panel, PT/INR 6.  EGD at the hospital for possible repeat banding.  The patient is high risk.The nature of the procedure, as well as the risks, benefits, and alternatives were carefully and thoroughly reviewed with the patient. Ample time for discussion and questions allowed. The patient understood, was satisfied, and agreed to proceed. 7.  [facility], Dawn [facility]razek.  I seriously doubt he is a transplant candidate, but he requested evaluation 8.  GI follow-up after the above to be determined 9.  Ongoing close follow-up with your PCP Total time of 60 minutes was spent preparing to see the patient, reviewing the myriad of records, obtaining comprehensive history, performing medically appropriate physical examination, counseling and educating the patient regarding the above listed issues, ordering radiology study, ordering medication, ordering laboratories, ordering therapeutic endoscopy, and documenting clinical information in the health record

## 2023-09-18 ENCOUNTER — Other Ambulatory Visit: Payer: Self-pay

## 2023-09-18 ENCOUNTER — Other Ambulatory Visit: Payer: Self-pay | Admitting: Family Medicine

## 2023-09-18 DIAGNOSIS — K7030 Alcoholic cirrhosis of liver without ascites: Secondary | ICD-10-CM

## 2023-09-18 DIAGNOSIS — D509 Iron deficiency anemia, unspecified: Secondary | ICD-10-CM

## 2023-09-18 DIAGNOSIS — K7011 Alcoholic hepatitis with ascites: Secondary | ICD-10-CM

## 2023-09-19 NOTE — Telephone Encounter (Signed)
error 

## 2023-09-22 ENCOUNTER — Ambulatory Visit (HOSPITAL_COMMUNITY)
Admission: RE | Admit: 2023-09-22 | Discharge: 2023-09-22 | Disposition: A | Discharge status: Home / Self Care | Payer: Medicaid Other | Source: Ambulatory Visit | Attending: Internal Medicine | Admitting: Internal Medicine

## 2023-09-22 DIAGNOSIS — K7030 Alcoholic cirrhosis of liver without ascites: Secondary | ICD-10-CM | POA: Insufficient documentation

## 2023-09-22 DIAGNOSIS — K7689 Other specified diseases of liver: Secondary | ICD-10-CM | POA: Diagnosis not present

## 2023-09-22 DIAGNOSIS — Z0389 Encounter for observation for other suspected diseases and conditions ruled out: Secondary | ICD-10-CM | POA: Diagnosis not present

## 2023-09-22 DIAGNOSIS — K7460 Unspecified cirrhosis of liver: Secondary | ICD-10-CM | POA: Diagnosis not present

## 2023-09-22 DIAGNOSIS — K8020 Calculus of gallbladder without cholecystitis without obstruction: Secondary | ICD-10-CM | POA: Diagnosis not present

## 2023-09-25 ENCOUNTER — Other Ambulatory Visit: Payer: Self-pay | Admitting: Pharmacist

## 2023-09-25 NOTE — Progress Notes (Signed)
Patient called. Confirmed I was speaking to him using two identifiers. Introduced myself and the reason for my call. Patient is on ferrous sulfate and this is not covered by his Medicaid plan. It is because ferrous sulfate is available OTC. I looked up OTC products from [employer] and found [employer]Made 325mg  ferrous sulfate available at $12.09 from #180 tablets. Explained this to the patient, and this price is manageable for him. He will pick-up ferrous sulfate OTC from [facility]. Confirmed with him that he should be on 1 tablet BID, so #180 tablets will provide him with a 90-day supply.   Butch Penny, PharmD, [facility] [place], CPP Clinical Pharmacist [facility] [phone]

## 2023-09-26 ENCOUNTER — Ambulatory Visit: Payer: Medicaid Other | Attending: Physician Assistant | Admitting: Physician Assistant

## 2023-09-26 DIAGNOSIS — S161XXA Strain of muscle, fascia and tendon at neck level, initial encounter: Secondary | ICD-10-CM | POA: Diagnosis not present

## 2023-09-26 DIAGNOSIS — Z789 Other specified health status: Secondary | ICD-10-CM

## 2023-09-26 MED ORDER — METHOCARBAMOL 500 MG PO TABS: 1000.0000 mg | ORAL_TABLET | Freq: Three times a day (TID) | ORAL | 0 refills | Status: DC | PRN

## 2023-09-26 NOTE — Progress Notes (Signed)
Patient ID: Richard [name], male   DOB: 24-Aug-1966, 57 y.o.   MRN: [id number] Virtual Visit via Telephone Note  I connected with Richard [name] on 09/26/23 at 11:10 AM EST by telephone and verified that I am speaking with the correct person using two identifiers.  Location: Patient: home Provider: [facility] office    I discussed the limitations, risks, security and privacy concerns of performing an evaluation and management service by telephone and the availability of in person appointments. I also discussed with the patient that there may be a patient responsible charge related to this service. The patient expressed understanding and agreed to proceed.   History of Present Illness:  Mr Richard [name] is c/o L neck pain that has been going on for about 2 weeks. He can't get into a comfortable position.  NKI.  No radiating pain or SOB.  No new weakness.      Observations/Objective: did visit by telephone bc patient unable to get on mychart and was unable to get a ride to the office.  Needs redirecting.  Speech can be mumbling alternating with clear.  He is overall a poor historian.     Assessment and Plan: 1. Strain of neck muscle, initial encounter (Primary) - methocarbamol (ROBAXIN) 500 MG tablet; Take 2 tablets (1,000 mg total) by mouth every 8 (eight) hours as needed. For muscle pain  Dispense: 90 tablet; Refill: 0  2. Poor historian Advised to ED if worsens or new s/sx develop due to overall poor health   Follow Up Instructions: Follow up with PCP as next scheduled   I discussed the assessment and treatment plan with the patient. The patient was provided an opportunity to ask questions and all were answered. The patient agreed with the plan and demonstrated an understanding of the instructions.   The patient was advised to call back or seek an in-person evaluation if the symptoms worsen or if the condition fails to improve as anticipated.  I provided 12 minutes of non-face-to-face time  during this encounter.   Georgian Co, PA-C

## 2023-09-28 ENCOUNTER — Other Ambulatory Visit: Payer: Self-pay | Admitting: Family Medicine

## 2023-09-28 ENCOUNTER — Other Ambulatory Visit: Payer: Self-pay

## 2023-09-28 DIAGNOSIS — K7011 Alcoholic hepatitis with ascites: Secondary | ICD-10-CM

## 2023-09-28 DIAGNOSIS — K7030 Alcoholic cirrhosis of liver without ascites: Secondary | ICD-10-CM

## 2023-09-28 NOTE — Telephone Encounter (Signed)
Medication Refill -  Most Recent Primary Care Visit:  Provider: Anders Simmonds  Department: [facility]  Visit Type: TELEPHONE OFFICE VISIT  Date: 09/26/2023 furosemide (LASIX) 40 MG tablet  Medication:   Has the patient contacted their pharmacy? Yes   Is this the correct pharmacy for this prescription? Yes  This is the patient's preferred pharmacy:  [id number] [place], [place] - [address] AT [facility] [address] [facility] [address] [place] [address] Phone: [phone] Fax: [phone]   Has the prescription been filled recently? Yes  Is the patient out of the medication? Yes  Has the patient been seen for an appointment in the last year OR does the patient have an upcoming appointment? Yes  Can we respond through MyChart? No  Agent: Please be advised that Rx refills may take up to 3 business days. We ask that you follow-up with your pharmacy.

## 2023-10-03 MED ORDER — FUROSEMIDE 40 MG PO TABS: 40.0000 mg | ORAL_TABLET | Freq: Every day | ORAL | 0 refills | Status: DC

## 2023-10-03 NOTE — Telephone Encounter (Signed)
 Requested Prescriptions  Pending Prescriptions Disp Refills   furosemide  (LASIX ) 40 MG tablet 90 tablet 0    Sig: Take 1 tablet (40 mg total) by mouth daily.     Cardiovascular:  Diuretics - Loop Failed - 10/03/2023 12:30 PM      Failed - K in normal range and within 180 days    Potassium  Date Value Ref Range Status  09/15/2023 3.3 (L) 3.5 - 5.1 mEq/L Final         Failed - Mg Level in normal range and within 180 days    Magnesium   Date Value Ref Range Status  11/20/2022 1.6 (L) 1.7 - 2.4 mg/dL Final    Comment:    Performed at [facility], [address] [place], [place] [address]         Passed - Ca in normal range and within 180 days    Calcium  Date Value Ref Range Status  09/15/2023 8.9 8.4 - 10.5 mg/dL Final   Calcium, Ion  Date Value Ref Range Status  09/23/2022 0.81 (LL) 1.15 - 1.40 mmol/L Final         Passed - Na in normal range and within 180 days    Sodium  Date Value Ref Range Status  09/15/2023 137 135 - 145 mEq/L Final  05/10/2023 139 134 - 144 mmol/L Final         Passed - Cr in normal range and within 180 days    Creat  Date Value Ref Range Status  11/06/2020 0.60 (L) 0.70 - 1.33 mg/dL Final    Comment:    For patients >70 years of age, the reference limit for Creatinine is approximately 13% higher for people identified as African-American. .    Creatinine, Ser  Date Value Ref Range Status  09/15/2023 0.84 0.40 - 1.50 mg/dL Final         Passed - Cl in normal range and within 180 days    Chloride  Date Value Ref Range Status  09/15/2023 105 96 - 112 mEq/L Final         Passed - Last BP in normal range    BP Readings from Last 1 Encounters:  09/15/23 106/60         Passed - Valid encounter within last 6 months    Recent Outpatient Visits           1 week ago Strain of neck muscle, initial encounter   [facility] Dept Of [place]. [facility], [place], [place]   4 months ago  Alcoholic cirrhosis of liver with ascites [facility])   [place] [facility] - [facility] Dept Of [place]. [facility] Delbert Clam, MD   4 months ago Alcoholic cirrhosis of liver with ascites [facility])   [facility] [facility] - A Dept Of [place]. [facility], Jon M, [place]   11 months ago Alcoholic cirrhosis of liver without ascites [facility])   [place] [facility] - A Dept Of [place]. [facility] Delbert Clam, MD   1 year ago Other chronic pain   [facility] - A Dept Of [place]. [facility] Vicci Barnie NOVAK, MD

## 2023-10-05 ENCOUNTER — Ambulatory Visit: Payer: Self-pay

## 2023-10-05 NOTE — Telephone Encounter (Signed)
 Chief Complaint: Medication Question   Disposition: [] ED /[] Urgent Care (no appt availability in office) / [] Appointment(In office/virtual)/ []  [place] [facility]/ [x] Home Care/ [] Refused Recommended Disposition /[] [place] [facility]/ []  Follow-up with PCP Additional Notes: Spoke to patient and informed him that the Furosemide  Rx was sent to the [facility] pharmacy on 10/03/23 #90. Patient states the pharmacy told him that the Rx for Furosemide  was discontinued. I called [facility] and spoke to [facility], Teacher, Early Years/pre. Chiquita stated that the Rx for Furosemide  40 MG is ready for pick-up. Chiquita stated the patient has multiple profiles in the system and they be causing the confusion. Called patient back and informed him that the Furosemide  is ready for pick-up. Patient verbalized understanding.  Summary: Furosemide  questions for nurse   Pt called reporting that he is almost out of his current supply of furosemide . Pt says that he was told to contact his PCP, says that the pharmacy told him that his Rx has been discontinued. Wants to know why because he says he needs to take this medication. Wants to speak to the clinic.      Reason for Disposition  Caller has medicine question only, adult not sick, AND triager answers question  Answer Assessment - Initial Assessment Questions 1. NAME of MEDICINE: What medicine(s) are you calling about?     Furosemide  40 MG 2. QUESTION: What is your question? (e.g., double dose of medicine, side effect)     Why was my Furosemide  discontinued?  3. PRESCRIBER: Who prescribed the medicine? Reason: if prescribed by specialist, call should be referred to that group.     Dr. Newlin  Protocols used: Medication Question Call-A-AH

## 2023-10-05 NOTE — Telephone Encounter (Signed)
 Patient called, unable to leave VM to return the call to the office to speak to the NT due to VM not being set up.  Lasix  was refilled on 10/03/23 and sent to [facility] on [address]   Summary: Furosemide  questions for nurse   Pt called reporting that he is almost out of his current supply of furosemide . Pt says that he was told to contact his PCP, says that the pharmacy told him that his Rx has been discontinued. Wants to know why because he says he needs to take this medication. Wants to speak to the clinic.

## 2023-10-16 ENCOUNTER — Telehealth: Payer: Self-pay

## 2023-10-16 ENCOUNTER — Other Ambulatory Visit (INDEPENDENT_AMBULATORY_CARE_PROVIDER_SITE_OTHER): Payer: Medicaid Other

## 2023-10-16 DIAGNOSIS — D509 Iron deficiency anemia, unspecified: Secondary | ICD-10-CM | POA: Diagnosis not present

## 2023-10-16 DIAGNOSIS — K7030 Alcoholic cirrhosis of liver without ascites: Secondary | ICD-10-CM

## 2023-10-16 LAB — CBC WITH DIFFERENTIAL/PLATELET
Basophils Absolute: 0.1 10*3/uL (ref 0.0–0.1)
Basophils Relative: 1.2 % (ref 0.0–3.0)
Eosinophils Absolute: 0.1 10*3/uL (ref 0.0–0.7)
Eosinophils Relative: 1.4 % (ref 0.0–5.0)
HCT: 39.1 % (ref 39.0–52.0)
Hemoglobin: 12.6 g/dL — ABNORMAL LOW (ref 13.0–17.0)
Lymphocytes Relative: 30.3 % (ref 12.0–46.0)
Lymphs Abs: 1.6 10*3/uL (ref 0.7–4.0)
MCHC: 32.2 g/dL (ref 30.0–36.0)
MCV: 81 fL (ref 78.0–100.0)
Monocytes Absolute: 0.7 10*3/uL (ref 0.1–1.0)
Monocytes Relative: 13.9 % — ABNORMAL HIGH (ref 3.0–12.0)
Neutro Abs: 2.9 10*3/uL (ref 1.4–7.7)
Neutrophils Relative %: 53.2 % (ref 43.0–77.0)
Platelets: 116 10*3/uL — ABNORMAL LOW (ref 150.0–400.0)
RBC: 4.83 Mil/uL (ref 4.22–5.81)
RDW: 31.6 % — ABNORMAL HIGH (ref 11.5–15.5)
WBC: 5.4 10*3/uL (ref 4.0–10.5)

## 2023-10-16 LAB — BASIC METABOLIC PANEL
BUN: 6 mg/dL (ref 6–23)
CO2: 23 meq/L (ref 19–32)
Calcium: 9.1 mg/dL (ref 8.4–10.5)
Chloride: 101 meq/L (ref 96–112)
Creatinine, Ser: 0.99 mg/dL (ref 0.40–1.50)
GFR: 85.54 mL/min (ref >60.00)
Glucose, Bld: 78 mg/dL (ref 70–99)
Potassium: 3.5 meq/L (ref 3.5–5.1)
Sodium: 131 meq/L — ABNORMAL LOW (ref 135–145)

## 2023-10-16 NOTE — Telephone Encounter (Signed)
 Attempted to reach patient. No vm set up. Unable to leave a message, will attempt to reach patient at a different time.

## 2023-10-16 NOTE — Telephone Encounter (Signed)
 Pt returned call. Pt is aware that he is due for repeat labs at this time. No appointment is necessary. Patient is aware that he can stop by the lab in the basement at his convenience between 7:30 AM - 5 PM, Monday through Friday. Patient verbalized understanding and had no concerns at the end of the call.

## 2023-10-16 NOTE — Telephone Encounter (Signed)
-----   Message from Nurse Kerrie [place] sent at 09/18/2023  9:37 AM EST ----- Regarding: labs Pt needs labs, orders in epic.

## 2023-10-17 ENCOUNTER — Other Ambulatory Visit: Payer: Self-pay | Admitting: *Deleted

## 2023-10-17 DIAGNOSIS — D509 Iron deficiency anemia, unspecified: Secondary | ICD-10-CM

## 2023-10-17 DIAGNOSIS — K7030 Alcoholic cirrhosis of liver without ascites: Secondary | ICD-10-CM

## 2023-10-17 DIAGNOSIS — I8500 Esophageal varices without bleeding: Secondary | ICD-10-CM

## 2023-10-20 ENCOUNTER — Ambulatory Visit: Payer: Self-pay

## 2023-10-20 NOTE — Telephone Encounter (Signed)
   Chief Complaint: Pt. Has head congestion with tan mucus. Asking what he can take OTC. Symptoms: Above Frequency: 2 weeks Pertinent Negatives: Patient denies fever Disposition: [] ED /[] Urgent Care (no appt availability in office) / [] Appointment(In office/virtual)/ []  [place] [facility]/ [x] Home Care/ [] Refused Recommended Disposition /[] [place] [facility]/ []  Follow-up with PCP Additional Notes: Please advise pt.  Reason for Disposition  [1] Sinus congestion as part of a cold AND [2] present < 10 days  Answer Assessment - Initial Assessment Questions 1. LOCATION: "Where does it hurt?"      Head 2. ONSET: "When did the sinus pain start?"  (e.g., hours, days)      2 weeks 3. SEVERITY: "How bad is the pain?"   (Scale 1-10; mild, moderate or severe)   - MILD (1-3): doesn't interfere with normal activities    - MODERATE (4-7): interferes with normal activities (e.g., work or school) or awakens from sleep   - SEVERE (8-10): excruciating pain and patient unable to do any normal activities        No 4. RECURRENT SYMPTOM: "Have you ever had sinus problems before?" If Yes, ask: "When was the last time?" and "What happened that time?"      Yes 5. NASAL CONGESTION: "Is the nose blocked?" If Yes, ask: "Can you open it or must you breathe through your mouth?"     No 6. NASAL DISCHARGE: "Do you have discharge from your nose?" If so ask, "What color?"     Tan 7. FEVER: "Do you have a fever?" If Yes, ask: "What is it, how was it measured, and when did it start?"      No 8. OTHER SYMPTOMS: "Do you have any other symptoms?" (e.g., sore throat, cough, earache, difficulty breathing)     Cough 9. PREGNANCY: "Is there any chance you are pregnant?" "When was your last menstrual period?"     N/a  Protocols used: Sinus Pain or Congestion-A-AH

## 2023-10-20 NOTE — Telephone Encounter (Signed)
Summary: blood when he blow/spit   Patient called said he is experiencing thicker saliva that includes blood as well as when he blow his nose there is blood. Please f/u with patient       Voice mailbox not set up, unable to leave a message.

## 2023-10-20 NOTE — Telephone Encounter (Signed)
Call x2 to patient unable to reach , unable to leave vm . Vm full

## 2023-10-23 NOTE — Telephone Encounter (Signed)
Spoke with patietnt.patient advise that I had spoken with the pharmacist and was advised that with his cardiac history he cannot take stand-alone decongestants like pseudoephedrine. Therefore, the only safe OTC products I can recommend is guaifenesin + a nasal saline. This is not ideal and may not help as good as other options. But they are the safest OTC products to take. If he does not get any relief with these, he will need to put in a note to his PCP to see if they will write for something different or if they would be okay with a very low dose APAP product safe to take with his cardiac hx (something like Coricidin HBP). Patient voiced that he understood advise given . Voiced that he would like to be schedule for a VV as soon as possible. VV for Wednesday.

## 2023-10-24 ENCOUNTER — Other Ambulatory Visit: Payer: Self-pay | Admitting: Family Medicine

## 2023-10-24 DIAGNOSIS — K7011 Alcoholic hepatitis with ascites: Secondary | ICD-10-CM

## 2023-10-24 NOTE — Telephone Encounter (Signed)
Requested Prescriptions  Pending Prescriptions Disp Refills   pantoprazole (PROTONIX) 40 MG tablet [Pharmacy Med Name: PANTOPRAZOLE 40MG  TABLETS] 30 tablet 0    Sig: TAKE 1 TABLET(40 MG) BY MOUTH DAILY     Gastroenterology: Proton Pump Inhibitors Passed - 10/24/2023  1:41 PM      Passed - Valid encounter within last 12 months    Recent Outpatient Visits           4 weeks ago Strain of neck muscle, initial encounter   [facility] Dept Of [place]. [facility], [place], [place]   4 months ago Alcoholic cirrhosis of liver with ascites [facility])   [facility] [facility] - A Dept Of [place]. [facility] Hoy Register, MD   5 months ago Alcoholic cirrhosis of liver with ascites [facility])   [place] [facility] - A Dept Of [place]. [facility], Marylene Land M, [place]   12 months ago Alcoholic cirrhosis of liver without ascites [facility])   [place] [facility] - A Dept Of [place]. [facility] Hoy Register, MD   1 year ago Other chronic pain    [facility] - A Dept Of [place]. [facility] Laural Benes, Binnie Rail, MD       Future Appointments             Tomorrow Hoy Register, MD [facility] - A Dept Of [place] Brideg[place]. [facility]

## 2023-10-25 ENCOUNTER — Encounter: Payer: Self-pay | Admitting: Family Medicine

## 2023-10-25 ENCOUNTER — Ambulatory Visit: Payer: Medicaid Other | Attending: Family Medicine | Admitting: Family Medicine

## 2023-10-25 DIAGNOSIS — K0889 Other specified disorders of teeth and supporting structures: Secondary | ICD-10-CM | POA: Diagnosis not present

## 2023-10-25 DIAGNOSIS — J018 Other acute sinusitis: Secondary | ICD-10-CM | POA: Diagnosis not present

## 2023-10-25 MED ORDER — DOXYCYCLINE HYCLATE 100 MG PO TABS: 100.0000 mg | ORAL_TABLET | Freq: Two times a day (BID) | ORAL | 0 refills | Status: DC

## 2023-10-25 NOTE — Progress Notes (Signed)
Virtual Visit via Telephone Note  I connected with Richard [name], on 10/25/2023 at 1:48 PM by telephone and verified that I am speaking with the correct person using two identifiers.    Clinician has audio - video capabilities but patient was only able to operate/preferred audio.  Consent: I discussed the limitations, risks, security and privacy concerns of performing an evaluation and management service by telephone and the availability of in person appointments. I also discussed with the patient that there may be a patient responsible charge related to this service. The patient expressed understanding and agreed to proceed.   Location of Patient: Home  Location of Provider: Clinic   Persons participating in Telemedicine visit: Richard [name] Dr. Alvis Lemmings     History of Present Illness: Richard [name] is a 58 y.o. year old male with a history of peptic ulcer disease, alcoholic liver cirrhosis, GERD, colon cancer (status post partial colectomy) SVT, dilated cardiomyopathy (EF 60-65%), Depression, left eye blindness.   Discussed the use of AI scribe software for clinical note transcription with the patient, who gave verbal consent to proceed.  He presents with nasal congestion and nausea of two to three weeks duration. The congestion is severe, with the patient describing a constant need to clear his nose. Occasionally, he notes the presence of blood in his nasal discharge. He denies any facial pain or fever. He also reports a significant amount of postnasal drip, which he believes is contributing to his nausea. He has not taken any over-the-counter or prescribed medications for these symptoms. He denies any recent exposure to sick contacts.  In addition to his nasal symptoms, the patient also reports significant dental pain due to a large cavity. He has been unable to find a dentist to address this issue.        Past Medical History:  Diagnosis Date   Abnormal  nuclear cardiac imaging test    Acid reflux    Acute pancreatitis 08/14/2018   Alcohol withdrawal delirium (HCC) 08/20/2016   Alcoholic cardiomyopathy (HCC) 30/86/5784   Alcoholic hepatitis    Alcoholic ketoacidosis 11/13/2017   Ascites 12/13/2019   Aspiration pneumonia (HCC) 08/20/2016   Chest pain of uncertain etiology    Chronic systolic CHF (congestive heart failure) (HCC)    Cirrhosis (HCC)    Colon cancer (HCC)    DCM (dilated cardiomyopathy) (HCC)    Drug abuse (HCC) 01/07/2020   Elevated troponin 06/27/2018   ETOH abuse    Gastropathy 08/14/2018   Heme positive stool 11/13/2017   Hepatic steatosis 08/21/2016   High anion gap metabolic acidosis 11/05/2018   History of colon cancer    HTN (hypertension)    Hypertensive urgency 06/27/2018   Hypoglycemia 06/27/2018   Hypokalemia 01/07/2020   Hypomagnesemia    Hypophosphatemia    Lactic acidosis 08/20/2016   Leukocytosis 01/07/2020   Neuropathy    Pancreatitis 08/2018   Polyp of ascending colon    Prolonged Q-T interval on ECG    Prolonged QT interval    Protein-calorie malnutrition, severe 05/02/2018   PUD (peptic ulcer disease)    SBP (spontaneous bacterial peritonitis) (HCC) 01/07/2020   Sepsis (HCC) 08/20/2016   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   SVT (supraventricular tachycardia) (HCC)    Thrombocytopenia (HCC) 08/21/2016   Allergies  Allergen Reactions   Aspirin Other (See Comments)    Caused acid reflux   Penicillins Hives    Has patient had a PCN reaction causing immediate rash, facial/tongue/throat swelling, SOB or  lightheadedness with hypotension: yes Has patient had a PCN reaction causing severe rash involving mucus membranes or skin necrosis: no Has patient had a PCN reaction that required hospitalization: no Has patient had a PCN reaction occurring within the last 10 years: no If all of the above answers are "NO", then may proceed with Cephalosporin use.     Current Outpatient Medications on  File Prior to Visit  Medication Sig Dispense Refill   acetaminophen (TYLENOL) 650 MG CR tablet Take 1 tablet (650 mg total) by mouth every 8 (eight) hours as needed for pain. 20 tablet 0   albuterol (VENTOLIN HFA) 108 (90 Base) MCG/ACT inhaler Inhale 1-2 puffs into the lungs every 6 (six) hours as needed for wheezing or shortness of breath. 8 g 0   diclofenac Sodium (VOLTAREN) 1 % GEL Apply 2 g topically 4 (four) times daily as needed (pain).     ferrous gluconate (FERGON) 324 MG tablet Take 1 tablet (324 mg total) by mouth 2 (two) times daily with a meal. 60 tablet 3   fluticasone (FLONASE) 50 MCG/ACT nasal spray Place 1 spray into both nostrils daily as needed for allergies or rhinitis.     folic acid (FOLVITE) 1 MG tablet Take 1 tablet (1 mg total) by mouth daily. 30 tablet 0   furosemide (LASIX) 40 MG tablet Take 1 tablet (40 mg total) by mouth daily. 90 tablet 0   gabapentin (NEURONTIN) 300 MG capsule TAKE 2 CAPSULES(600 MG) BY MOUTH THREE TIMES DAILY 180 capsule 2   Iron, Ferrous Sulfate, 325 (65 Fe) MG TABS Take 1 tablet by mouth in the morning and at bedtime. 60 tablet 6   lactulose (CHRONULAC) 10 GM/15ML solution Take 30 mLs (20 g total) by mouth 2 (two) times daily. 1800 mL 2   methocarbamol (ROBAXIN) 500 MG tablet Take 2 tablets (1,000 mg total) by mouth every 8 (eight) hours as needed. For muscle pain 90 tablet 0   mirtazapine (REMERON) 15 MG tablet Take 1 tablet (15 mg total) by mouth at bedtime. 90 tablet 1   pantoprazole (PROTONIX) 40 MG tablet TAKE 1 TABLET(40 MG) BY MOUTH DAILY 30 tablet 0   propranolol (INDERAL) 10 MG tablet Take 1 tablet (10 mg total) by mouth 2 (two) times daily. 60 tablet 0   spironolactone (ALDACTONE) 25 MG tablet Take 0.5 tablets (12.5 mg total) by mouth daily. 30 tablet 0   thiamine (VITAMIN B1) 100 MG tablet Take 1 tablet (100 mg total) by mouth daily. 30 tablet 1   No current facility-administered medications on file prior to visit.    ROS: See  HPI  Observations/Objective: Awake, alert, oriented x3 Not in acute distress Normal mood      Latest Ref Rng & Units 10/16/2023    3:40 PM 09/15/2023    3:04 PM 07/19/2023    6:33 AM  CMP  Glucose 70 - 99 mg/dL 78  78  606   BUN 6 - 23 mg/dL 6  7  5    Creatinine 0.40 - 1.50 mg/dL 3.01  6.01  0.93   Sodium 135 - 145 mEq/L 131  137  135   Potassium 3.5 - 5.1 mEq/L 3.5  3.3  3.7   Chloride 96 - 112 mEq/L 101  105  108   CO2 19 - 32 mEq/L 23  24  19    Calcium 8.4 - 10.5 mg/dL 9.1  8.9  8.7   Total Protein 6.0 - 8.3 g/dL  7.1  6.0  Total Bilirubin 0.2 - 1.2 mg/dL  1.4  2.4   Alkaline Phos 39 - 117 U/L  171  107   AST 0 - 37 U/L  22  32   ALT 0 - 53 U/L  12  18     Lipid Panel     Component Value Date/Time   CHOL 156 06/24/2022 0900   TRIG 58 06/24/2022 0900   HDL 77 06/24/2022 0900   CHOLHDL 2.0 06/24/2022 0900   CHOLHDL NOT CALCULATED 01/11/2020 0640   VLDL 30 01/11/2020 0640   LDLCALC 67 06/24/2022 0900   LABVLDL 12 06/24/2022 0900    Lab Results  Component Value Date   HGBA1C <4.2 (L) 09/24/2022    Assessment and Plan:     Sinusitis Nasal congestion, postnasal drip, and bloody nose for 2-3 weeks. No facial pain or fever. Likely secondary to sinus infection. -Prescribe Doxycycline, send to [facility] on [address] and [place].  Nausea Ongoing for a while, likely secondary to postnasal drip.  Dental Pain Significant dental pain with a large cavity. Unable to find a dentist. -Place referral for dental consultation.        Follow Up Instructions: 1 month   I discussed the assessment and treatment plan with the patient. The patient was provided an opportunity to ask questions and all were answered. The patient agreed with the plan and demonstrated an understanding of the instructions.   The patient was advised to call back or seek an in-person evaluation if the symptoms worsen or if the condition fails to improve as anticipated.     I provided 13  minutes total of non-face-to-face time during this encounter.   Hoy Register, MD, FAAFP. [facility] [place], [place] [phone]   10/25/2023, 1:48 PM

## 2023-11-06 NOTE — Telephone Encounter (Signed)
Copied from CRM [id number]. Topic: Referral - Request for Referral >> Nov 06, 2023  8:26 AM Carlatta H wrote: Did the patient discuss referral with their provider in the last year? Yes (If No - schedule appointment) (If Yes - send message)  Appointment offered? No  Type of order/referral and detailed reason for visit: Dentist   Preference of office, provider, location: [facility]  If referral order, have you been seen by this specialty before? No (If Yes, this issue or another issue? When? Where?  Can we respond through MyChart? No   Referral already sent

## 2023-11-06 NOTE — Telephone Encounter (Signed)
Referral haa already been sent

## 2023-11-08 ENCOUNTER — Encounter (HOSPITAL_COMMUNITY): Payer: Self-pay | Admitting: Internal Medicine

## 2023-11-14 ENCOUNTER — Other Ambulatory Visit: Payer: Self-pay | Admitting: Family Medicine

## 2023-11-14 DIAGNOSIS — K7011 Alcoholic hepatitis with ascites: Secondary | ICD-10-CM

## 2023-11-14 DIAGNOSIS — S161XXA Strain of muscle, fascia and tendon at neck level, initial encounter: Secondary | ICD-10-CM

## 2023-11-14 NOTE — Telephone Encounter (Unsigned)
Copied from CRM [id number]. Topic: Clinical - Medication Refill >> Nov 14, 2023  5:10 PM Gery Pray wrote: Most Recent Primary Care Visit:  Provider: Hoy Register  Department: [facility]  Visit Type: MYCHART VIDEO VISIT  Date: 10/25/2023  Medication: methocarbamol (ROBAXIN) 500 MG tablet  Has the patient contacted their pharmacy? No (Agent: If no, request that the patient contact the pharmacy for the refill. If patient does not wish to contact the pharmacy document the reason why and proceed with request.) (Agent: If yes, when and what did the pharmacy advise?)  Is this the correct pharmacy for this prescription? Yes If no, delete pharmacy and type the correct one.  This is the patient's preferred pharmacy:  [id number] [place], [place] - [address] AT [facility] OF [address] & [facility] [address] [place] [address] Phone: [phone] Fax: [phone]   Has the prescription been filled recently? No  Is the patient out of the medication? No  Has the patient been seen for an appointment in the last year OR does the patient have an upcoming appointment? Yes  Can we respond through MyChart? No  Agent: Please be advised that Rx refills may take up to 3 business days. We ask that you follow-up with your pharmacy.

## 2023-11-14 NOTE — Anesthesia Preprocedure Evaluation (Signed)
Anesthesia Evaluation  Patient identified by MRN, date of birth, ID band Patient awake    Reviewed: Allergy & Precautions, NPO status , Patient's Chart, lab work & pertinent test results, reviewed documented beta blocker date and time   Airway Mallampati: III  TM Distance: >3 FB Neck ROM: Full    Dental no notable dental hx. (+) Loose, Poor Dentition,    Pulmonary Recent URI  (10/25/23, no residual cough), Resolved, Current Smoker 10cigg/d, no inhalers   Pulmonary exam normal breath sounds clear to auscultation       Cardiovascular hypertension, Pt. on medications and Pt. on home beta blockers + Past MI  Normal cardiovascular exam Rhythm:Regular Rate:Normal  Echo 2023  1. Left ventricular ejection fraction, by estimation, is 60 to 65%. The  left ventricle has normal function. The left ventricle has no regional  wall motion abnormalities. Left ventricular diastolic parameters were  normal.   2. Right ventricular systolic function is normal. The right ventricular  size is normal. Tricuspid regurgitation signal is inadequate for assessing  PA pressure.   3. The mitral valve is normal in structure. No evidence of mitral valve  regurgitation. No evidence of mitral stenosis.   4. The aortic valve is normal in structure. Aortic valve regurgitation is  not visualized. No aortic stenosis is present.   5. The inferior vena cava is normal in size with greater than 50%  respiratory variability, suggesting right atrial pressure of 3 mmHg.      Neuro/Psych negative neurological ROS  negative psych ROS   GI/Hepatic PUD,GERD  Medicated and Controlled,,(+) Cirrhosis     substance abuse  alcohol use and marijuana use  Endo/Other  negative endocrine ROS    Renal/GU negative Renal ROS  negative genitourinary   Musculoskeletal negative musculoskeletal ROS (+)    Abdominal   Peds  Hematology negative hematology ROS (+)    Anesthesia Other Findings   Reproductive/Obstetrics negative OB ROS                             Anesthesia Physical Anesthesia Plan  ASA: 4  Anesthesia Plan: General   Post-op Pain Management:    Induction:   PONV Risk Score and Plan: 2 and Ondansetron, Dexamethasone, Midazolam and Treatment may vary due to age or medical condition  Airway Management Planned: Oral ETT  Additional Equipment: None  Intra-op Plan:   Post-operative Plan: Extubation in OR  Informed Consent: I have reviewed the patients History and Physical, chart, labs and discussed the procedure including the risks, benefits and alternatives for the proposed anesthesia with the patient or authorized representative who has indicated his/her understanding and acceptance.     Dental advisory given  Plan Discussed with: CRNA  Anesthesia Plan Comments: (Laryngoscope Size: Mac and 4 Grade View: Grade II Tube type: Oral Tube size: 7.5 mm Number of attempts: 1 )       Anesthesia Quick Evaluation

## 2023-11-14 NOTE — Telephone Encounter (Unsigned)
Copied from CRM [id number]. Topic: Clinical - Medication Refill >> Nov 14, 2023  5:06 PM Gery Pray wrote: Most Recent Primary Care Visit:  Provider: Hoy Register  Department: [facility]  Visit Type: MYCHART VIDEO VISIT  Date: 10/25/2023  Medication: lactulose (CHRONULAC) 10 GM/15ML solution  Has the patient contacted their pharmacy? Yes (Agent: If no, request that the patient contact the pharmacy for the refill. If patient does not wish to contact the pharmacy document the reason why and proceed with request.) (Agent: If yes, when and what did the pharmacy advise?)  Is this the correct pharmacy for this prescription? Yes If no, delete pharmacy and type the correct one.  This is the patient's preferred pharmacy:  [id number] [place] Otto, [place] - [address] AT [facility] [address] [facility] [address] [place] [address] Phone: [phone] Fax: [phone]   Has the prescription been filled recently? No  Is the patient out of the medication? Yes  Has the patient been seen for an appointment in the last year OR does the patient have an upcoming appointment? Yes  Can we respond through MyChart? No  Agent: Please be advised that Rx refills may take up to 3 business days. We ask that you follow-up with your pharmacy.

## 2023-11-15 ENCOUNTER — Telehealth: Payer: Self-pay

## 2023-11-15 ENCOUNTER — Encounter (HOSPITAL_COMMUNITY)
Admission: RE | Disposition: A | Discharge status: Home / Self Care | Payer: Self-pay | Source: Home / Self Care | Attending: Internal Medicine

## 2023-11-15 ENCOUNTER — Encounter (HOSPITAL_COMMUNITY): Payer: Self-pay | Admitting: Internal Medicine

## 2023-11-15 ENCOUNTER — Ambulatory Visit (HOSPITAL_COMMUNITY)
Admission: RE | Admit: 2023-11-15 | Discharge: 2023-11-15 | Disposition: A | Discharge status: Home / Self Care | Payer: Medicaid Other | Attending: Internal Medicine | Admitting: Internal Medicine

## 2023-11-15 ENCOUNTER — Other Ambulatory Visit: Payer: Self-pay

## 2023-11-15 ENCOUNTER — Ambulatory Visit (HOSPITAL_BASED_OUTPATIENT_CLINIC_OR_DEPARTMENT_OTHER): Payer: Medicaid Other | Admitting: Anesthesiology

## 2023-11-15 ENCOUNTER — Ambulatory Visit (HOSPITAL_COMMUNITY): Payer: Self-pay | Admitting: Anesthesiology

## 2023-11-15 DIAGNOSIS — Z8711 Personal history of peptic ulcer disease: Secondary | ICD-10-CM | POA: Diagnosis not present

## 2023-11-15 DIAGNOSIS — K219 Gastro-esophageal reflux disease without esophagitis: Secondary | ICD-10-CM | POA: Insufficient documentation

## 2023-11-15 DIAGNOSIS — K7460 Unspecified cirrhosis of liver: Secondary | ICD-10-CM | POA: Diagnosis not present

## 2023-11-15 DIAGNOSIS — I8510 Secondary esophageal varices without bleeding: Secondary | ICD-10-CM | POA: Diagnosis not present

## 2023-11-15 DIAGNOSIS — I420 Dilated cardiomyopathy: Secondary | ICD-10-CM | POA: Diagnosis not present

## 2023-11-15 DIAGNOSIS — K860 Alcohol-induced chronic pancreatitis: Secondary | ICD-10-CM | POA: Diagnosis not present

## 2023-11-15 DIAGNOSIS — I426 Alcoholic cardiomyopathy: Secondary | ICD-10-CM | POA: Insufficient documentation

## 2023-11-15 DIAGNOSIS — Z79899 Other long term (current) drug therapy: Secondary | ICD-10-CM | POA: Insufficient documentation

## 2023-11-15 DIAGNOSIS — K7030 Alcoholic cirrhosis of liver without ascites: Secondary | ICD-10-CM | POA: Insufficient documentation

## 2023-11-15 DIAGNOSIS — F17210 Nicotine dependence, cigarettes, uncomplicated: Secondary | ICD-10-CM | POA: Diagnosis not present

## 2023-11-15 DIAGNOSIS — K3189 Other diseases of stomach and duodenum: Secondary | ICD-10-CM | POA: Insufficient documentation

## 2023-11-15 DIAGNOSIS — K7010 Alcoholic hepatitis without ascites: Secondary | ICD-10-CM | POA: Insufficient documentation

## 2023-11-15 DIAGNOSIS — I252 Old myocardial infarction: Secondary | ICD-10-CM | POA: Insufficient documentation

## 2023-11-15 DIAGNOSIS — I5022 Chronic systolic (congestive) heart failure: Secondary | ICD-10-CM | POA: Diagnosis not present

## 2023-11-15 DIAGNOSIS — I110 Hypertensive heart disease with heart failure: Secondary | ICD-10-CM | POA: Diagnosis not present

## 2023-11-15 DIAGNOSIS — K760 Fatty (change of) liver, not elsewhere classified: Secondary | ICD-10-CM | POA: Diagnosis not present

## 2023-11-15 DIAGNOSIS — I8500 Esophageal varices without bleeding: Secondary | ICD-10-CM

## 2023-11-15 DIAGNOSIS — Z85038 Personal history of other malignant neoplasm of large intestine: Secondary | ICD-10-CM | POA: Diagnosis not present

## 2023-11-15 DIAGNOSIS — I10 Essential (primary) hypertension: Secondary | ICD-10-CM | POA: Diagnosis not present

## 2023-11-15 DIAGNOSIS — K449 Diaphragmatic hernia without obstruction or gangrene: Secondary | ICD-10-CM | POA: Diagnosis not present

## 2023-11-15 DIAGNOSIS — K766 Portal hypertension: Secondary | ICD-10-CM | POA: Insufficient documentation

## 2023-11-15 DIAGNOSIS — G629 Polyneuropathy, unspecified: Secondary | ICD-10-CM | POA: Insufficient documentation

## 2023-11-15 HISTORY — PX: ESOPHAGEAL BANDING: SHX5518

## 2023-11-15 HISTORY — PX: ESOPHAGOGASTRODUODENOSCOPY (EGD) WITH PROPOFOL: SHX5813

## 2023-11-15 SURGERY — ESOPHAGOGASTRODUODENOSCOPY (EGD) WITH PROPOFOL
Anesthesia: General

## 2023-11-15 MED ORDER — PROPOFOL 10 MG/ML IV BOLUS
INTRAVENOUS | Status: DC | PRN
  Administered 2023-11-15: 100 mg via INTRAVENOUS
  Administered 2023-11-15: 50 mg via INTRAVENOUS

## 2023-11-15 MED ORDER — PROPOFOL 10 MG/ML IV BOLUS
INTRAVENOUS | Status: AC
  Filled 2023-11-15: qty 20

## 2023-11-15 MED ORDER — DEXAMETHASONE SODIUM PHOSPHATE 10 MG/ML IJ SOLN
INTRAMUSCULAR | Status: DC | PRN
  Administered 2023-11-15: 4 mg via INTRAVENOUS

## 2023-11-15 MED ORDER — SUCCINYLCHOLINE CHLORIDE 200 MG/10ML IV SOSY
PREFILLED_SYRINGE | INTRAVENOUS | Status: DC | PRN
  Administered 2023-11-15: 100 mg via INTRAVENOUS

## 2023-11-15 MED ORDER — MIDAZOLAM HCL 5 MG/5ML IJ SOLN
INTRAMUSCULAR | Status: DC | PRN
Start: 2023-11-15 — End: 2023-11-15
  Administered 2023-11-15: 2 mg via INTRAVENOUS

## 2023-11-15 MED ORDER — MIDAZOLAM HCL 2 MG/2ML IJ SOLN
INTRAMUSCULAR | Status: AC
  Filled 2023-11-15: qty 2

## 2023-11-15 MED ORDER — ONDANSETRON HCL 4 MG/2ML IJ SOLN
INTRAMUSCULAR | Status: DC | PRN
  Administered 2023-11-15: 4 mg via INTRAVENOUS

## 2023-11-15 MED ORDER — FENTANYL CITRATE (PF) 100 MCG/2ML IJ SOLN
INTRAMUSCULAR | Status: AC
  Filled 2023-11-15: qty 2

## 2023-11-15 MED ORDER — LIDOCAINE HCL (CARDIAC) PF 100 MG/5ML IV SOSY
PREFILLED_SYRINGE | INTRAVENOUS | Status: DC | PRN
  Administered 2023-11-15: 60 mg via INTRAVENOUS

## 2023-11-15 MED ORDER — FENTANYL CITRATE (PF) 100 MCG/2ML IJ SOLN
INTRAMUSCULAR | Status: DC | PRN
Start: 2023-11-15 — End: 2023-11-15
  Administered 2023-11-15: 100 ug via INTRAVENOUS

## 2023-11-15 MED ORDER — LACTATED RINGERS IV SOLN: INTRAVENOUS | Status: DC | PRN

## 2023-11-15 SURGICAL SUPPLY — 14 items

## 2023-11-15 NOTE — Anesthesia Postprocedure Evaluation (Signed)
Anesthesia Post Note  Patient: Ruvim Risko Prather  Procedure(s) Performed: ESOPHAGOGASTRODUODENOSCOPY (EGD) WITH PROPOFOL ESOPHAGEAL BANDING     Patient location during evaluation: PACU Anesthesia Type: General Level of consciousness: awake and alert, oriented and patient cooperative Pain management: pain level controlled Vital Signs Assessment: post-procedure vital signs reviewed and stable Respiratory status: spontaneous breathing, nonlabored ventilation and respiratory function stable Cardiovascular status: blood pressure returned to baseline and stable Postop Assessment: no apparent nausea or vomiting Anesthetic complications: no   No notable events documented.  Last Vitals:  Vitals:   11/15/23 0940 11/15/23 1100  BP: 135/62 (!) 147/69  Pulse: 60 75  Resp: 11 14  Temp: 36.9 C 36.8 C  SpO2: 97% 98%    Last Pain:  Vitals:   11/15/23 1100  TempSrc: Temporal  PainSc: 0-No pain                 Lannie Fields

## 2023-11-15 NOTE — Transfer of Care (Signed)
Immediate Anesthesia Transfer of Care Note  Patient: Richard [name]  Procedure(s) Performed: ESOPHAGOGASTRODUODENOSCOPY (EGD) WITH PROPOFOL ESOPHAGEAL BANDING  Patient Location: PACU  Anesthesia Type:MAC  Level of Consciousness: awake and alert   Airway & Oxygen Therapy: Patient Spontanous Breathing and Patient connected to face mask oxygen  Post-op Assessment: Report given to RN and Post -op Vital signs reviewed and stable  Post vital signs: Reviewed and stable  Last Vitals:  Vitals Value Taken Time  BP 147/69 11/15/23 1058  Temp    Pulse 77 11/15/23 1102  Resp 14 11/15/23 1102  SpO2 99 % 11/15/23 1102  Vitals shown include unfiled device data.  Last Pain:  Vitals:   11/15/23 0940  TempSrc: Temporal  PainSc: 0-No pain         Complications: No notable events documented.

## 2023-11-15 NOTE — H&P (Signed)
Expand All Collapse All HISTORY OF PRESENT ILLNESS:   Richard [name] is a 58 y.o. male with chronic alcoholism and multiple chronic complications of alcoholism including hepatic cirrhosis with portal hypertension and bleeding esophageal varices, alcoholic hepatitis, alcoholic pancreatitis, alcohol withdrawal and delirium, alcoholic cardiomyopathy, alcoholic painful neuropathy.  He also has a history of drug abuse and chronic tobacco abuse.   Other issues include history of colon cancer with prior resection.  He is sent today by his primary care provider regarding management of his liver disease.  The patient has been lost to follow-up.  He has innumerable complaints.  He has multiple questions.  I have not seen the patient in the office since September 2021.  He has seen our physician assistants on several occasions.   The patient was hospitalized July 17, 2023 through July 19, 2023 regarding symptomatic anemia.  Hemoglobin on admission was 5.1.  He was transfused.  Discharge hemoglobin 9.2.  Other labs included low albumin 2.8, bilirubin 1.6, creatinine 0.71.  He was discharged home and told to follow-up with GI.  CT scan September 23, 2022 revealed evidence of cirrhosis, partial colectomy, small volume ascites, gallstones.  Last ultrasound February 2014 revealed unremarkable hepatic vascular Doppler evaluation.  Cirrhotic liver and ascites noted.   Patient tells me that his principal complaint is painful neuropathy of his feet.  He also complains of abdominal bloating, shortness of breath, and fatigue.  He tells me that he has not had alcohol in 18 months.  He inquires about liver transplant evaluation.  He continues to smoke.   I reviewed his medications.  The following items are on his list but he is not taking: Albuterol inhaler, ferrous gluconate, folic acid, Remeron, propranolol, and thiamine.  He does tell me that he is taking furosemide 40 mg daily, Aldactone 25 mg daily, pantoprazole  40 mg daily, lactulose, and gabapentin.   Takes 1 dose of lactulose per day has about 1 or 2 bowel movements per day.   Upper endoscopy with Dr. Tomasa Rand November 19, 2022 to evaluate hematemesis revealed esophageal varices which were banded.  Repeat EGD with banding in 4 weeks recommended.  This was not done.  His last colonoscopy March 2021 revealed prior colonic resection but was otherwise unremarkable.   REVIEW OF SYSTEMS:   All non-GI ROS negative except for sinus trouble, arthritis, back pain, visual change, cough, depression, fatigue, muscle cramps, night sweats, nosebleeds, lower extremity edema, excessive thirst, shortness of breath       Past Medical History:  Diagnosis Date   Abnormal nuclear cardiac imaging test     Acid reflux     Acute pancreatitis 08/14/2018   Alcohol withdrawal delirium (HCC) 08/20/2016   Alcoholic cardiomyopathy (HCC) 16/07/9603   Alcoholic hepatitis     Alcoholic ketoacidosis 11/13/2017   Ascites 12/13/2019   Aspiration pneumonia (HCC) 08/20/2016   Chest pain of uncertain etiology     Chronic systolic CHF (congestive heart failure) (HCC)     Cirrhosis (HCC)     Colon cancer (HCC)     DCM (dilated cardiomyopathy) (HCC)     Drug abuse (HCC) 01/07/2020   Elevated troponin 06/27/2018   ETOH abuse     Gastropathy 08/14/2018   Heme positive stool 11/13/2017   Hepatic steatosis 08/21/2016   High anion gap metabolic acidosis 11/05/2018   History of colon cancer     HTN (hypertension)     Hypertensive urgency 06/27/2018   Hypoglycemia 06/27/2018   Hypokalemia 01/07/2020  Hypomagnesemia     Hypophosphatemia     Lactic acidosis 08/20/2016   Leukocytosis 01/07/2020   Neuropathy     Pancreatitis 08/2018   Polyp of ascending colon     Prolonged Q-T interval on ECG     Prolonged QT interval     Protein-calorie malnutrition, severe 05/02/2018   PUD (peptic ulcer disease)     SBP (spontaneous bacterial peritonitis) (HCC) 01/07/2020   Sepsis  (HCC) 08/20/2016   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   SVT (supraventricular tachycardia) (HCC)     Thrombocytopenia (HCC) 08/21/2016               Past Surgical History:  Procedure Laterality Date   BIOPSY   12/14/2019    Procedure: BIOPSY;  Surgeon: Napoleon Form, MD;  Location: [facility];  Service: Endoscopy;;   BIOPSY   11/19/2022    Procedure: BIOPSY;  Surgeon: Jenel Lucks, MD;  Location: [facility]COPY;  Service: Gastroenterology;;   catherization   2007   COLONOSCOPY WITH PROPOFOL N/A 12/14/2019    Procedure: COLONOSCOPY WITH PROPOFOL;  Surgeon: Napoleon Form, MD;  Location: [facility];  Service: Endoscopy;  Laterality: N/A;   ESOPHAGEAL BANDING   11/19/2022    Procedure: ESOPHAGEAL BANDING;  Surgeon: Jenel Lucks, MD;  Location: [facility] ENDOSCOPY;  Service: Gastroenterology;;   ESOPHAGOGASTRODUODENOSCOPY (EGD) WITH PROPOFOL N/A 12/14/2019    Procedure: ESOPHAGOGASTRODUODENOSCOPY (EGD) WITH PROPOFOL;  Surgeon: Napoleon Form, MD;  Location: [facility];  Service: Endoscopy;  Laterality: N/A;   ESOPHAGOGASTRODUODENOSCOPY (EGD) WITH PROPOFOL N/A 11/19/2022    Procedure: ESOPHAGOGASTRODUODENOSCOPY (EGD) WITH PROPOFOL;  Surgeon: Jenel Lucks, MD;  Location: [facility] ENDOSCOPY;  Service: Gastroenterology;  Laterality: N/A;   HERNIA REPAIR   1969    1 x at birth and at 58 years old   LAPAROSCOPIC SIGMOID COLECTOMY   2007   OPEN REDUCTION INTERNAL FIXATION (ORIF) HAND Right 2012    3rd  digit   POLYPECTOMY   12/14/2019    Procedure: POLYPECTOMY;  Surgeon: Napoleon Form, MD;  Location: [facility];  Service: Endoscopy;;          Social History Richard [name]  reports that he has been smoking cigarettes. He has never used smokeless tobacco. He reports that he does not currently use alcohol after a past usage of about 2.0 standard drinks of alcohol per week. He reports current drug use. Drug: Marijuana.   family history includes Cancer in his  sister; Colon cancer in his cousin and father; Hypertension in his mother; Kidney disease in his sister.   Allergies       Allergies  Allergen Reactions   Aspirin Other (See Comments)      Caused acid reflux   Penicillins Hives      Has patient had a PCN reaction causing immediate rash, facial/tongue/throat swelling, SOB or lightheadedness with hypotension: yes Has patient had a PCN reaction causing severe rash involving mucus membranes or skin necrosis: no Has patient had a PCN reaction that required hospitalization: no Has patient had a PCN reaction occurring within the last 10 years: no If all of the above answers are "NO", then may proceed with Cephalosporin use.              PHYSICAL EXAMINATION: Vital signs: BP 106/60 (BP Location: Left Arm, Patient Position: Sitting, Cuff Size: Normal)   Pulse 80   Ht 5\' 10"  (1.778 m)   Wt 154 lb (69.9 kg)   BMI 22.10 kg/m  Constitutional: Chronically ill-appearing, thin, no acute distress Psychiatric: alert and oriented x3, cooperative.  Mumbles Eyes: extraocular movements intact, anicteric, conjunctiva pink Mouth: oral pharynx moist, no lesions Neck: supple no lymphadenopathy Cardiovascular: heart regular rate and rhythm. Lungs: clear to auscultation bilaterally Abdomen: soft, nontender, nondistended, no obvious ascites, no peritoneal signs, normal bowel sounds, no organomegaly Rectal: Omitted Extremities: no clubbing, cyanosis, or lower extremity edema bilaterally Skin: no lesions on visible extremities Neuro: No focal deficits. No asterixis.       ASSESSMENT:   1.  Hepatic cirrhosis with portal hypertension secondary to alcohol.  Radiology over the past year as outlined above 2.  History of bleeding esophageal varices.  Last endoscopy February 2024 3.  Multiple complications of alcoholism including liver disease, history of pancreatitis, cardiomyopathy, withdrawal, and neuropathy 4.  Chronic smoker 5.  Other general medical  problems 6.  History of colon cancer status post resection 2007.  Last colonoscopy 2021     PLAN:   1.  Prescribed iron sulfate 325 mg p.o. twice daily 2.  Refill lactulose.  Take daily to achieve 3 bowel movements 3.  Stop smoking 4.  Abdominal ultrasound to rule out significant ascites and rule out hepatoma. 5.  Laboratories today including CBC, comprehensive metabolic panel, PT/INR 6.  EGD at the hospital for possible repeat banding.  The patient is high risk.The nature of the procedure, as well as the risks, benefits, and alternatives were carefully and thoroughly reviewed with the patient. Ample time for discussion and questions allowed. The patient understood, was satisfied, and agreed to proceed. 7.  Outpatient appointment with [facility], Dawn Drazek.  I seriously doubt he is a transplant candidate, but he requested evaluation 8.  GI follow-up after the above to be determined 9.  Ongoing close follow-up with your PCP  Recent complete H&P as outlined above.  No interval change.  He does have an appointment with [facility] January 08, 2024.  Now for upper endoscopy with possible esophageal banding for history of bleeding esophageal varices.  Last procedure February 2024

## 2023-11-15 NOTE — Telephone Encounter (Signed)
-----   Message from Nurse Deno Etienne sent at 11/13/2023 11:14 AM EST ----- Attempted to reach patient 11/13/23 11:14 am. No answer, voicemail not set up. ----- Message ----- From: Richardson Chiquito, RN Sent: 11/13/2023  12:00 AM EST To: Chrystie Nose, RN  See 10/16/23 lab result notes; pt needs repeat cbc, bmp around 11/17/23; orders already in epic. Patient just needs reminder call. Richard [name] Patient

## 2023-11-15 NOTE — Telephone Encounter (Signed)
Spoke with pt and he knows to come for labs.

## 2023-11-15 NOTE — Op Note (Signed)
[facility] Patient Name: Richard [name] Procedure Date: 11/15/2023 MRN: [id number] Attending MD: Wilhemina Bonito. Marina Goodell , MD, [id number] Date of Birth: 1966-03-01 CSN: [id number] Age: 58 Admit Type: Outpatient Procedure:                Upper GI endoscopy with band ligation of esophageal                            varices x 4 Indications:              For follow-up and therapy of esophageal varices.                            Her EGD with band ligation for bleeding esophageal                            varices February 2024 Providers:                Wilhemina Bonito. Marina Goodell, MD, Jacquelyn "Jaci" Clelia Croft, RN,                            Kandice Robinsons, Technician Referring MD:             Hoy Register, MD Medicines:                Monitored Anesthesia Care Complications:            No immediate complications. Estimated Blood Loss:     Estimated blood loss: none. Procedure:                Pre-Anesthesia Assessment:                           - Prior to the procedure, a History and Physical                            was performed, and patient medications and                            allergies were reviewed. The patient's tolerance of                            previous anesthesia was also reviewed. The risks                            and benefits of the procedure and the sedation                            options and risks were discussed with the patient.                            All questions were answered, and informed consent                            was obtained. Prior Anticoagulants: The patient has  taken no anticoagulant or antiplatelet agents. ASA                            Grade Assessment: III - A patient with severe                            systemic disease. After reviewing the risks and                            benefits, the patient was deemed in satisfactory                            condition to undergo the procedure.                            After obtaining informed consent, the endoscope was                            passed under direct vision. Throughout the                            procedure, the patient's blood pressure, pulse, and                            oxygen saturations were monitored continuously. The                            [id number]-H190 ( [id number] ) Olympus Endoscope was                            introduced through the mouth, and advanced to the                            second part of duodenum. The upper GI endoscopy was                            accomplished without difficulty. The patient                            tolerated the procedure well. Scope In: Scope Out: Findings:      The esophagus revealed 3 columns of 1-2+ varices without stigmata. Some       scarring from prior band ligation. Four bands were successfully placed.       There was no bleeding during the maneuver.      The stomach revealed mild changes of portal hypertensive gastropathy.       There were changes in the fundus suggestive of varices.      The examined duodenum was normal.      The cardia and gastric fundus on retroflexion suggestive of fundic       varix. Small hiatal hernia.. Impression:               1. Esophageal varices status post band ligation x 4  2. History of bleeding esophageal varices February                            2024                           3. Mild portal hypertensive gastropathy                           4. 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 Moderate Sedation:      none Recommendation:           1. Patient has a contact number available for                            emergencies. The signs and symptoms of potential                            delayed complications were discussed with the                            patient. Return to normal activities tomorrow.                            Written discharge instructions were provided to the                            patient.                            2. Clear liquids for 2 hours, then soft foods until                            tomorrow. May resume regular diet tomorrow                           3. Continue present medications.                           4. Repeat EGD with possible band ligation in 6 to 8                            weeks. Office should contact you regarding                            scheduling.                           5. Keep your appointment with [facility]                            specialist, Annamarie Major, on January 08, 2024 as                            scheduled Procedure Code(s):        --- Professional ---  65784, Esophagogastroduodenoscopy, flexible,                            transoral; diagnostic, including collection of                            specimen(s) by brushing or washing, when performed                            (separate procedure) Diagnosis Code(s):        --- Professional ---                           I85.00, Esophageal varices without bleeding CPT copyright 2022 American Medical Association. All rights reserved. The codes documented in this report are preliminary and upon coder review may  be revised to meet current compliance requirements. Wilhemina Bonito. Marina Goodell, MD 11/15/2023 10:59:32 AM This report has been signed electronically. Number of Addenda: 0

## 2023-11-15 NOTE — Anesthesia Procedure Notes (Signed)
Procedure Name: Intubation Date/Time: 11/15/2023 10:32 AM  Performed by: Jamelle Rushing, CRNAPre-anesthesia Checklist: Patient identified, Emergency Drugs available, Suction available, Patient being monitored and Timeout performed Patient Re-evaluated:Patient Re-evaluated prior to induction Oxygen Delivery Method: Circle system utilized Preoxygenation: Pre-oxygenation with 100% oxygen Induction Type: IV induction Ventilation: Mask ventilation without difficulty Laryngoscope Size: Mac and 3 Grade View: Grade I Tube type: Oral Tube size: 7.5 mm Number of attempts: 1 Airway Equipment and Method: Stylet Placement Confirmation: ETT inserted through vocal cords under direct vision, positive ETCO2 and breath sounds checked- equal and bilateral Secured at: 22 cm Tube secured with: Tape Dental Injury: Teeth and Oropharynx as per pre-operative assessment

## 2023-11-15 NOTE — Discharge Instructions (Addendum)
YOU HAD AN ENDOSCOPIC PROCEDURE TODAY: Refer to the procedure report and other information in the discharge instructions given to you for any specific questions about what was found during the examination. If this information does not answer your questions, please call [place] office at [phone] to clarify.   YOU SHOULD EXPECT: Some feelings of bloating in the abdomen. Passage of more gas than usual. Walking can help get rid of the air that was put into your GI tract during the procedure and reduce the bloating. If you had a lower endoscopy (such as a colonoscopy or flexible sigmoidoscopy) you may notice spotting of blood in your stool or on the toilet paper. Some abdominal soreness may be present for a day or two, also.  DIET:  clear liquids to start until 1 pm then Soft foods for the remainder of the day. Normal diet tomorrow Drink plenty of fluids but you should avoid alcoholic beverages .   ACTIVITY: Your care partner should take you home directly after the procedure. You should plan to take it easy, moving slowly for the rest of the day. You can resume normal activity the day after the procedure however YOU SHOULD NOT DRIVE, use power tools, machinery or perform tasks that involve climbing or major physical exertion for 24 hours (because of the sedation medicines used during the test).   SYMPTOMS TO REPORT IMMEDIATELY: A gastroenterologist can be reached at any hour. Please call [phone]  for any of the following symptoms:   Following upper endoscopy (EGD, EUS, ERCP, esophageal dilation) Vomiting of blood or coffee ground material  New, significant abdominal pain  New, significant chest pain or pain under the shoulder blades  Painful or persistently difficult swallowing  New shortness of breath  Black, tarry-looking or red, bloody stools  FOLLOW UP:  If any biopsies were taken you will be contacted by phone or by letter within the next 1-3 weeks. Call [phone]  if you have not  heard about the biopsies in 3 weeks.  Please also call with any specific questions about appointments or follow up tests.

## 2023-11-17 ENCOUNTER — Encounter (HOSPITAL_COMMUNITY): Payer: Self-pay | Admitting: Internal Medicine

## 2023-11-17 ENCOUNTER — Telehealth: Payer: Self-pay | Admitting: Internal Medicine

## 2023-11-17 DIAGNOSIS — K7011 Alcoholic hepatitis with ascites: Secondary | ICD-10-CM

## 2023-11-17 MED ORDER — LACTULOSE 10 GM/15ML PO SOLN: 20.0000 g | Freq: Two times a day (BID) | ORAL | 2 refills | Status: DC

## 2023-11-17 NOTE — Telephone Encounter (Signed)
Patient called stated Dr. Marina Goodell usually refill his Lactulose medication and he is out of it seeking a refill as soon as possible. Please advise.

## 2023-11-17 NOTE — Telephone Encounter (Signed)
Refilled Lactulose

## 2023-11-25 ENCOUNTER — Telehealth: Payer: Self-pay | Admitting: Gastroenterology

## 2023-11-25 NOTE — Telephone Encounter (Signed)
 Got paged 7 PM 2/21 and 8:54PM 2/21 To call [phone] As patient was having nausea/vomiting/vomiting blood  I called this number over 10 times-no answer.  The message would say-mailbox not set up, hence cannot leave a message.  I again called him on 8 AM 11/25/2023 -no answer  RG   Viviann Spare, can you please call him on Monday and see how he is doing RG

## 2023-11-27 ENCOUNTER — Other Ambulatory Visit: Payer: Self-pay | Admitting: Family Medicine

## 2023-11-27 DIAGNOSIS — K7011 Alcoholic hepatitis with ascites: Secondary | ICD-10-CM

## 2023-11-27 NOTE — Telephone Encounter (Signed)
 Unable to reach pt. Pt voice mail not set up yet.

## 2023-11-28 ENCOUNTER — Other Ambulatory Visit: Payer: Self-pay | Admitting: Family Medicine

## 2023-11-28 DIAGNOSIS — K7011 Alcoholic hepatitis with ascites: Secondary | ICD-10-CM

## 2023-11-28 NOTE — Telephone Encounter (Signed)
 Unable to reach pt. Pt voice mail not set up yet.

## 2023-11-28 NOTE — Telephone Encounter (Deleted)
 Copied from CRM [id number]. Topic: Clinical - Medication Refill >> Nov 28, 2023  8:53 AM Prudencio Pair wrote: Most Recent Primary Care Visit:  Provider: Hoy Register  Department: [facility]  Visit Type: MYCHART VIDEO VISIT  Date: 10/25/2023  Medication: pantoprazole (PROTONIX) 40 MG tablet & Iron, Ferrous Sulfate, 325 (65 Fe) MG TABS  Has the patient contacted their pharmacy? Yes (Agent: If no, request that the patient contact the pharmacy for the refill. If patient does not wish to contact the pharmacy document the reason why and proceed with request.) (Agent: If yes, when and what did the pharmacy advise?)  Is this the correct pharmacy for this prescription? Yes If no, delete pharmacy and type the correct one.  This is the patient's preferred pharmacy:  [facility][id number] [place] Otto, [place] - [address] AT [facility] [address] [facility] [address] [place] [address] Phone: [phone] Fax: [phone]   Has the prescription been filled recently? Yes  Is the patient out of the medication? Yes  Has the patient been seen for an appointment in the last year OR does the patient have an upcoming appointment? Yes  Can we respond through MyChart? Yes  Agent: Please be advised that Rx refills may take up to 3 business days. We ask that you follow-up with your pharmacy.

## 2023-11-28 NOTE — Telephone Encounter (Signed)
 Last Fill: 10/24/23 30 tabs/0 refills  Last OV: 10/25/23 Next OV: None Scheduled  Routing to provider for review/authorization.

## 2023-11-28 NOTE — Telephone Encounter (Signed)
 Copied from CRM [id number]. Topic: Clinical - Medication Refill >> Nov 28, 2023  9:34 AM Prudencio Pair wrote: Most Recent Primary Care Visit:  Provider: Hoy Register  Department: [facility]  Visit Type: MYCHART VIDEO VISIT  Date: 10/25/2023  Medication: pantoprazole (PROTONIX) 40 MG tablet   Has the patient contacted their pharmacy? Yes (Agent: If no, request that the patient contact the pharmacy for the refill. If patient does not wish to contact the pharmacy document the reason why and proceed with request.) (Agent: If yes, when and what did the pharmacy advise?)  Is this the correct pharmacy for this prescription? Yes If no, delete pharmacy and type the correct one.  This is the patient's preferred pharmacy:  [facility] #[id number] [place] Otto, [place] - [address] AT [facility] [address] & [facility] [address] [place] [address] Phone: [phone] Fax: [phone]   Has the prescription been filled recently? Yes  Is the patient out of the medication? Yes  Has the patient been seen for an appointment in the last year OR does the patient have an upcoming appointment? Yes  Can we respond through MyChart? Yes  Agent: Please be advised that Rx refills may take up to 3 business days. We ask that you follow-up with your pharmacy.

## 2023-11-29 NOTE — Telephone Encounter (Signed)
 Unable to reach pt. Pt voice mail not set up yet.

## 2023-11-30 NOTE — Telephone Encounter (Signed)
 Unable to reach patient, VM not set up.

## 2023-11-30 NOTE — Telephone Encounter (Signed)
 Patient returned call

## 2023-11-30 NOTE — Telephone Encounter (Signed)
Unable to reach patient, VM box not set up. 

## 2023-12-01 NOTE — Telephone Encounter (Signed)
 Unable to reach patient, VM box not set up.  Unable to reach pt after multiple attempts. Letter was created and sent to pt via mail.

## 2023-12-07 ENCOUNTER — Other Ambulatory Visit

## 2023-12-07 ENCOUNTER — Telehealth: Payer: Self-pay | Admitting: *Deleted

## 2023-12-07 ENCOUNTER — Other Ambulatory Visit: Payer: Self-pay | Admitting: *Deleted

## 2023-12-07 DIAGNOSIS — I8500 Esophageal varices without bleeding: Secondary | ICD-10-CM | POA: Diagnosis not present

## 2023-12-07 DIAGNOSIS — K7030 Alcoholic cirrhosis of liver without ascites: Secondary | ICD-10-CM

## 2023-12-07 DIAGNOSIS — D509 Iron deficiency anemia, unspecified: Secondary | ICD-10-CM

## 2023-12-07 LAB — CBC WITH DIFFERENTIAL/PLATELET
Basophils Absolute: 0 10*3/uL (ref 0.0–0.1)
Basophils Relative: 0.6 % (ref 0.0–3.0)
Eosinophils Absolute: 0 10*3/uL (ref 0.0–0.7)
Eosinophils Relative: 1.2 % (ref 0.0–5.0)
HCT: 44.5 % (ref 39.0–52.0)
Hemoglobin: 14.6 g/dL (ref 13.0–17.0)
Lymphocytes Relative: 26.5 % (ref 12.0–46.0)
Lymphs Abs: 1 10*3/uL (ref 0.7–4.0)
MCHC: 32.8 g/dL (ref 30.0–36.0)
MCV: 90.7 fl (ref 78.0–100.0)
Monocytes Absolute: 0.5 10*3/uL (ref 0.1–1.0)
Monocytes Relative: 11.8 % (ref 3.0–12.0)
Neutro Abs: 2.3 10*3/uL (ref 1.4–7.7)
Neutrophils Relative %: 59.9 % (ref 43.0–77.0)
Platelets: 147 10*3/uL — ABNORMAL LOW (ref 150.0–400.0)
RBC: 4.91 Mil/uL (ref 4.22–5.81)
RDW: 19.7 % — ABNORMAL HIGH (ref 11.5–15.5)
WBC: 3.9 10*3/uL — ABNORMAL LOW (ref 4.0–10.5)

## 2023-12-07 LAB — BASIC METABOLIC PANEL
BUN: 10 mg/dL (ref 6–23)
CO2: 21 meq/L (ref 19–32)
Calcium: 9.9 mg/dL (ref 8.4–10.5)
Chloride: 102 meq/L (ref 96–112)
Creatinine, Ser: 0.87 mg/dL (ref 0.40–1.50)
GFR: 96.79 mL/min (ref >60.00)
Glucose, Bld: 89 mg/dL (ref 70–99)
Potassium: 4 meq/L (ref 3.5–5.1)
Sodium: 134 meq/L — ABNORMAL LOW (ref 135–145)

## 2023-12-07 LAB — COMPREHENSIVE METABOLIC PANEL
ALT: 18 U/L (ref 0–53)
AST: 25 U/L (ref 0–37)
Albumin: 4.1 g/dL (ref 3.5–5.2)
Alkaline Phosphatase: 94 U/L (ref 39–117)
BUN: 10 mg/dL (ref 6–23)
CO2: 21 meq/L (ref 19–32)
Calcium: 9.9 mg/dL (ref 8.4–10.5)
Chloride: 102 meq/L (ref 96–112)
Creatinine, Ser: 0.87 mg/dL (ref 0.40–1.50)
GFR: 96.79 mL/min (ref >60.00)
Glucose, Bld: 89 mg/dL (ref 70–99)
Potassium: 4 meq/L (ref 3.5–5.1)
Sodium: 134 meq/L — ABNORMAL LOW (ref 135–145)
Total Bilirubin: 2.2 mg/dL — ABNORMAL HIGH (ref 0.2–1.2)
Total Protein: 8 g/dL (ref 6.0–8.3)

## 2023-12-07 LAB — PROTIME-INR
INR: 1.7 ratio — ABNORMAL HIGH (ref 0.8–1.0)
Prothrombin Time: 17.9 s — ABNORMAL HIGH (ref 9.6–13.1)

## 2023-12-07 MED ORDER — IRON (FERROUS SULFATE) 325 (65 FE) MG PO TABS: 1.0000 | ORAL_TABLET | Freq: Two times a day (BID) | ORAL | 6 refills | Status: AC

## 2023-12-07 NOTE — Telephone Encounter (Signed)
 Patient with history of alcoholic cirrhosis, esophageal varices and anemia. Had EGD with esophageal variceal banding 11/15/23 with requested repeat to follow in 6-8 weeks. To date, this has not yet been scheduled. In addition, patient was asked to take oral iron twice daily with repeat labs to be done 11/2023. He is scheduled to see [facility] 01/08/24.  Patient walked into the office today with complaints of a large amount dark blood in vomitus that occurred last week along with generalized abdominal discomfort. He denies any additional episodes of vomiting since last week and denies any dizziness, chest pain. Has chronic SOB.  Does note that his nose bleeds easily recently. No lower GI symptoms. Questions whether bleeding was a result of recent variceal banding. It is somewhat difficult to get direct answers from patient regarding his complaints, but he does assure me he is taking furosemide 40 mg daily, lactulose 30  mg twice daily, spironolactone as prescribed. He thinks he is taking propranolol twice daily but isnt really sure. He also tells me that he has not been taking his iron supplement because it was not at his pharmacy.   I have spoken with Dr Rhea Belton (doctor of the day) in Dr Lamar Sprinkles absence to advise him of the situation. Due to the time lapse between patients hematemesis episode and todays date as well as absence of continued vomiting/bleeding, I have been advised that patient should have CBC, CMP and INR drawn today to insure no significant imbalances.  I discussed this information with the patient (and his brother who was also present) and patient agrees to go to the lab for this blood work. I recommended again that he begin oral iron supplementation twice daily and advised he purchase this over the counter. He requests that this be sent as a prescription though since it may be cheaper for him. Will send rx. Discussed the need for repeat endoscopy with esophageal banding around 01/2024  timeframe at the hospital. I advised we will get this set up but I need to be able to get in touch with him (he has historically been difficult to reach by phone). Patient tells me he can be reached at [phone] or his mother can be contacted in order to reach him at [phone].  Will work on scheduling for hospital endoscopy with esophageal banding as previously recommended.

## 2023-12-07 NOTE — Telephone Encounter (Signed)
 I have spoken to patient to advise that per Dr Rhea Belton, labs from today show a stable hemoglobin. Advised he should follow with previous plan to have repeat endoscopy and variceal banding. I have also reiterated that should he have any more episodes of hematemesis before endoscopy, he should report to the emergency room for more emergent care. Patient verbalizes understanding.   I have also discussed with patient that he is now scheduled for his repeat endoscopy with esophageal banding at [facility] on (first availability) 02/22/24 at 9 am with Dr Marina Goodell. Patient to arrive at 7:30 am. Patient has been advised of time/date/location for upcoming procedure and has been given verbal prep instructions. Discussed that a care partner 18 years or older should bring him, stay for the procedure and drive home due to sedation. Written instructions have been made available to the patient for additional review via mychart and are also mailed to his home address.

## 2023-12-11 ENCOUNTER — Ambulatory Visit: Attending: Family Medicine | Admitting: Family Medicine

## 2023-12-11 ENCOUNTER — Encounter: Payer: Self-pay | Admitting: Family Medicine

## 2023-12-11 ENCOUNTER — Telehealth: Payer: Self-pay

## 2023-12-11 VITALS — BP 135/77 | HR 73 | Ht 72.0 in | Wt 137.6 lb

## 2023-12-11 DIAGNOSIS — G621 Alcoholic polyneuropathy: Secondary | ICD-10-CM | POA: Diagnosis not present

## 2023-12-11 DIAGNOSIS — Z23 Encounter for immunization: Secondary | ICD-10-CM | POA: Diagnosis not present

## 2023-12-11 DIAGNOSIS — Z720 Tobacco use: Secondary | ICD-10-CM | POA: Diagnosis not present

## 2023-12-11 DIAGNOSIS — K7031 Alcoholic cirrhosis of liver with ascites: Secondary | ICD-10-CM | POA: Diagnosis not present

## 2023-12-11 MED ORDER — GABAPENTIN 300 MG PO CAPS: 600.0000 mg | ORAL_CAPSULE | Freq: Three times a day (TID) | ORAL | 5 refills | Status: DC

## 2023-12-11 MED ORDER — PANTOPRAZOLE SODIUM 40 MG PO TBEC: 40.0000 mg | DELAYED_RELEASE_TABLET | Freq: Every day | ORAL | 1 refills | Status: DC

## 2023-12-11 MED ORDER — GABAPENTIN 300 MG PO CAPS: 600.0000 mg | ORAL_CAPSULE | Freq: Two times a day (BID) | ORAL | 1 refills | Status: DC

## 2023-12-11 MED ORDER — BUPROPION HCL ER (XL) 150 MG PO TB24
150.0000 mg | ORAL_TABLET | Freq: Every day | ORAL | 1 refills | Status: DC
Start: 2023-12-11 — End: 2024-01-04

## 2023-12-11 MED ORDER — FUROSEMIDE 40 MG PO TABS: 40.0000 mg | ORAL_TABLET | Freq: Every day | ORAL | 1 refills | Status: DC

## 2023-12-11 NOTE — Progress Notes (Signed)
 Subjective:  Patient ID: Richard [name], male    DOB: 1966-08-06  Age: 58 y.o. MRN: [id number]  CC: Medical Management of Chronic Issues (Gabapentin not working/Dental referral)     Discussed the use of AI scribe software for clinical note transcription with the patient, who gave verbal consent to proceed.  History of Present Illness The patient, with a history of peptic ulcer disease, alcoholic liver cirrhosis, GERD, colon cancer (status post partial colectomy) SVT, dilated cardiomyopathy (EF 60-65%), Depression, left eye blindness, cocaine dependence presents with worsening leg and foot pain. The pain is described as 'all my legs and it's the bottom of my feet' and is worse in cold temperatures. The patient has been managing the pain with gabapentin 300mg  three times a day, but reports that it 'wears off on me fast.' He also reports a significant weight loss of 20 pounds due to stress and decreased appetite. The patient is currently living in a stressful environment, sleeping on a couch at his sister's house.  This is managed by GI and he has an upcoming endoscopy scheduled.  He has been sober for almost 19 months.      Past Medical History:  Diagnosis Date   Abnormal nuclear cardiac imaging test    Acid reflux    Acute pancreatitis 08/14/2018   Alcohol withdrawal delirium (HCC) 08/20/2016   Alcoholic cardiomyopathy (HCC) 78/46/9629   Alcoholic hepatitis    Alcoholic ketoacidosis 11/13/2017   Ascites 12/13/2019   Aspiration pneumonia (HCC) 08/20/2016   Chest pain of uncertain etiology    Chronic systolic CHF (congestive heart failure) (HCC)    Cirrhosis (HCC)    Colon cancer (HCC)    DCM (dilated cardiomyopathy) (HCC)    Drug abuse (HCC) 01/07/2020   Elevated troponin 06/27/2018   ETOH abuse    Gastropathy 08/14/2018   Heme positive stool 11/13/2017   Hepatic steatosis 08/21/2016   High anion gap metabolic acidosis 11/05/2018   History of colon cancer    HTN  (hypertension)    Hypertensive urgency 06/27/2018   Hypoglycemia 06/27/2018   Hypokalemia 01/07/2020   Hypomagnesemia    Hypophosphatemia    Lactic acidosis 08/20/2016   Leukocytosis 01/07/2020   Neuropathy    Pancreatitis 08/2018   Polyp of ascending colon    Prolonged Q-T interval on ECG    Prolonged QT interval    Protein-calorie malnutrition, severe 05/02/2018   PUD (peptic ulcer disease)    SBP (spontaneous bacterial peritonitis) (HCC) 01/07/2020   Sepsis (HCC) 08/20/2016   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   SVT (supraventricular tachycardia) (HCC)    Thrombocytopenia (HCC) 08/21/2016    Past Surgical History:  Procedure Laterality Date   BIOPSY  12/14/2019   Procedure: BIOPSY;  Surgeon: Napoleon Form, MD;  Location: [facility] END[facility]COPY;  Service: Endoscopy;;   BIOPSY  11/19/2022   Procedure: BIOPSY;  Surgeon: Jenel Lucks, MD;  Location: [facility] ENDOSCOPY;  Service: Gastroenterology;;   catherization  2007   COLONOSCOPY WITH PROPOFOL N/A 12/14/2019   Procedure: COLONOSCOPY WITH PROPOFOL;  Surgeon: Napoleon Form, MD;  Location: [facility];  Service: Endoscopy;  Laterality: N/A;   ESOPHAGEAL BANDING  11/19/2022   Procedure: ESOPHAGEAL BANDING;  Surgeon: Jenel Lucks, MD;  Location: [facility] ENDOSCOPY;  Service: Gastroenterology;;   ESOPHAGEAL BANDING  11/15/2023   Procedure: ESOPHAGEAL BANDING;  Surgeon: Hilarie Fredrickson, MD;  Location: [facility] END[facility];  Service: Gastroenterology;;   ESOPHAGOGASTRODUODENOSCOPY (EGD) WITH PROPOFOL N/A 12/14/2019   Procedure: ESOPHAGOGASTRODUODENOSCOPY (EGD) WITH PROPOFOL;  Surgeon: Napoleon Form, MD;  Location: [facility];  Service: Endoscopy;  Laterality: N/A;   ESOPHAGOGASTRODUODENOSCOPY (EGD) WITH PROPOFOL N/A 11/19/2022   Procedure: ESOPHAGOGASTRODUODENOSCOPY (EGD) WITH PROPOFOL;  Surgeon: Jenel Lucks, MD;  Location: [facility] ENDOSCOPY;  Service: Gastroenterology;  Laterality: N/A;   ESOPHAGOGASTRODUODENOSCOPY (EGD) WITH  PROPOFOL N/A 11/15/2023   Procedure: ESOPHAGOGASTRODUODENOSCOPY (EGD) WITH PROPOFOL;  Surgeon: Hilarie Fredrickson, MD;  Location: [facility];  Service: Gastroenterology;  Laterality: N/A;   HERNIA REPAIR  1967   1 x at birth and at 58 years old   LAPAROSCOPIC SIGMOID COLECTOMY  2007   OPEN REDUCTION INTERNAL FIXATION (ORIF) HAND Right 2012   3rd  digit   POLYPECTOMY  12/14/2019   Procedure: POLYPECTOMY;  Surgeon: Napoleon Form, MD;  Location: [facility] END[facility];  Service: Endoscopy;;    Family History  Problem Relation Age of Onset   Hypertension Mother    Colon cancer Father    Cancer Sister        type unknown   Kidney disease Sister        dialysis   Colon cancer Cousin        x 2   CAD Neg Hx    Stroke Neg Hx    Diabetes Neg Hx    Stomach cancer Neg Hx    Esophageal cancer Neg Hx    Pancreatic cancer Neg Hx    Colon polyps Neg Hx     Social History   Socioeconomic History   Marital status: Divorced    Spouse name: Not on file   Number of children: Not on file   Years of education: Not on file   Highest education level: Not on file  Occupational History   Not on file  Tobacco Use   Smoking status: Every Day    Current packs/day: 1.00    Types: Cigarettes   Smokeless tobacco: Never  Vaping Use   Vaping status: Never Used  Substance and Sexual Activity   Alcohol use: Not Currently    Alcohol/week: 2.0 standard drinks of alcohol    Types: 1 Cans of beer, 1 Shots of liquor per week    Comment: last drink in February   Drug use: Yes    Types: Marijuana   Sexual activity: Not Currently  Other Topics Concern   Not on file  Social History Narrative   Not on file   Social Drivers of Health   Financial Resource Strain: High Risk (06/03/2020)   Overall Financial Resource Strain (CARDIA)    Difficulty of Paying Living Expenses: Very hard  Food Insecurity: Food Insecurity Present (07/20/2023)   Hunger Vital Sign    Worried About Running Out of Food in the Last Year:  Often true    Ran Out of Food in the Last Year: Often true  Transportation Needs: No Transportation Needs (07/20/2023)   PRAPARE - Administrator, Civil Service (Medical): No    Lack of Transportation (Non-Medical): No  Physical Activity: Not on file  Stress: Not on file  Social Connections: Unknown (02/14/2022)   [facility]   Social Network    Social Network: Not on file    Allergies  Allergen Reactions   Aspirin Other (See Comments)    Caused acid reflux   Penicillins Hives    Has patient had a PCN reaction causing immediate rash, facial/tongue/throat swelling, SOB or lightheadedness with hypotension: yes Has patient had a PCN reaction causing severe rash involving  mucus membranes or skin necrosis: no Has patient had a PCN reaction that required hospitalization: no Has patient had a PCN reaction occurring within the last 10 years: no If all of the above answers are "NO", then may proceed with Cephalosporin use.     Outpatient Medications Prior to Visit  Medication Sig Dispense Refill   acetaminophen (TYLENOL) 650 MG CR tablet Take 1 tablet (650 mg total) by mouth every 8 (eight) hours as needed for pain. 20 tablet 0   albuterol (VENTOLIN HFA) 108 (90 Base) MCG/ACT inhaler Inhale 1-2 puffs into the lungs every 6 (six) hours as needed for wheezing or shortness of breath. 8 g 0   diclofenac Sodium (VOLTAREN) 1 % GEL Apply 2 g topically 4 (four) times daily as needed (pain).     ferrous gluconate (FERGON) 324 MG tablet Take 1 tablet (324 mg total) by mouth 2 (two) times daily with a meal. 60 tablet 3   fluticasone (FLONASE) 50 MCG/ACT nasal spray Place 1 spray into both nostrils daily as needed for allergies or rhinitis.     folic acid (FOLVITE) 1 MG tablet Take 1 tablet (1 mg total) by mouth daily. 30 tablet 0   Iron, Ferrous Sulfate, 325 (65 Fe) MG TABS Take 1 tablet by mouth in the morning and at bedtime. 60 tablet 6   lactulose  (CHRONULAC) 10 GM/15ML solution Take 30 mLs (20 g total) by mouth 2 (two) times daily. 1800 mL 2   methocarbamol (ROBAXIN) 500 MG tablet Take 2 tablets (1,000 mg total) by mouth every 8 (eight) hours as needed. For muscle pain 90 tablet 0   propranolol (INDERAL) 10 MG tablet Take 1 tablet (10 mg total) by mouth 2 (two) times daily. 60 tablet 0   spironolactone (ALDACTONE) 25 MG tablet Take 0.5 tablets (12.5 mg total) by mouth daily. 30 tablet 0   thiamine (VITAMIN B1) 100 MG tablet Take 1 tablet (100 mg total) by mouth daily. 30 tablet 1   doxycycline (VIBRA-TABS) 100 MG tablet Take 1 tablet (100 mg total) by mouth 2 (two) times daily. 20 tablet 0   furosemide (LASIX) 40 MG tablet Take 1 tablet (40 mg total) by mouth daily. 90 tablet 0   gabapentin (NEURONTIN) 300 MG capsule TAKE 2 CAPSULES(600 MG) BY MOUTH THREE TIMES DAILY 180 capsule 2   mirtazapine (REMERON) 15 MG tablet Take 1 tablet (15 mg total) by mouth at bedtime. 90 tablet 1   pantoprazole (PROTONIX) 40 MG tablet TAKE 1 TABLET(40 MG) BY MOUTH DAILY 90 tablet 0   No facility-administered medications prior to visit.     ROS Review of Systems  Constitutional:  Negative for activity change and appetite change.  HENT:  Negative for sinus pressure and sore throat.   Respiratory:  Negative for chest tightness, shortness of breath and wheezing.   Cardiovascular:  Negative for chest pain and palpitations.  Gastrointestinal:  Negative for abdominal distention, abdominal pain and constipation.  Genitourinary: Negative.   Musculoskeletal:        See HPI  Neurological:  Positive for numbness.  Psychiatric/Behavioral:  Negative for behavioral problems and dysphoric mood.     Objective:  BP 135/77   Pulse 73   Ht 6' (1.829 m)   Wt 137 lb 9.6 oz (62.4 kg)   SpO2 98%   BMI 18.66 kg/m      12/11/2023    2:10 PM 11/15/2023   11:20 AM 11/15/2023   11:10 AM  BP/Weight  Systolic BP  135 137 143  Diastolic BP 77 71 73  Wt. (Lbs) 137.6     BMI 18.66 kg/m2        Physical Exam Constitutional:      Appearance: He is well-developed.  Cardiovascular:     Rate and Rhythm: Normal rate.     Heart sounds: Normal heart sounds. No murmur heard. Pulmonary:     Effort: Pulmonary effort is normal.     Breath sounds: Normal breath sounds. No wheezing or rales.  Chest:     Chest wall: No tenderness.  Abdominal:     General: Bowel sounds are normal. There is no distension.     Palpations: Abdomen is soft. There is no mass.     Tenderness: There is no abdominal tenderness.  Musculoskeletal:        General: Normal range of motion.     Right lower leg: No edema.     Left lower leg: No edema.  Neurological:     Mental Status: He is alert and oriented to person, place, and time.  Psychiatric:        Mood and Affect: Mood normal.        Latest Ref Rng & Units 12/07/2023   12:24 PM 10/16/2023    3:40 PM 09/15/2023    3:04 PM  CMP  Glucose 70 - 99 mg/dL 70 - 99 mg/dL 89    89  78  78   BUN 6 - 23 mg/dL 6 - 23 mg/dL 10    10  6  7    Creatinine 0.40 - 1.50 mg/dL 1.61 - 0.96 mg/dL 0.45    4.09  8.11  9.14   Sodium 135 - 145 mEq/L 135 - 145 mEq/L 134    134  131  137   Potassium 3.5 - 5.1 mEq/L 3.5 - 5.1 mEq/L 4.0    4.0  3.5  3.3   Chloride 96 - 112 mEq/L 96 - 112 mEq/L 102    102  101  105   CO2 19 - 32 mEq/L 19 - 32 mEq/L 21    21  23  24    Calcium 8.4 - 10.5 mg/dL 8.4 - 78.2 mg/dL 9.9    9.9  9.1  8.9   Total Protein 6.0 - 8.3 g/dL 8.0   7.1   Total Bilirubin 0.2 - 1.2 mg/dL 2.2   1.4   Alkaline Phos 39 - 117 U/L 94   171   AST 0 - 37 U/L 25   22   ALT 0 - 53 U/L 18   12     Lipid Panel     Component Value Date/Time   CHOL 156 06/24/2022 0900   TRIG 58 06/24/2022 0900   HDL 77 06/24/2022 0900   CHOLHDL 2.0 06/24/2022 0900   CHOLHDL NOT CALCULATED 01/11/2020 0640   VLDL 30 01/11/2020 0640   LDLCALC 67 06/24/2022 0900    CBC    Component Value Date/Time   WBC 3.9 (L) 12/07/2023 1224   RBC 4.91  12/07/2023 1224   HGB 14.6 12/07/2023 1224   HGB 7.7 (L) 05/10/2023 1417   HGB 15.6 08/31/2007 0955   HCT 44.5 12/07/2023 1224   HCT 23.9 (L) 05/10/2023 1417   HCT 44.5 08/31/2007 0955   PLT 147.0 (L) 12/07/2023 1224   PLT 138 (L) 05/10/2023 1417   MCV 90.7 12/07/2023 1224   MCV 62 (L) 05/10/2023 1417   MCV 97.2 08/31/2007 0955   MCH 22.3 (L)  09/15/2023 1641   MCHC 32.8 12/07/2023 1224   RDW 19.7 (H) 12/07/2023 1224   RDW 18.0 (H) 05/10/2023 1417   RDW 13.0 08/31/2007 0955   LYMPHSABS 1.0 12/07/2023 1224   LYMPHSABS 1.4 05/10/2023 1417   LYMPHSABS 1.6 08/31/2007 0955   MONOABS 0.5 12/07/2023 1224   MONOABS 0.6 08/31/2007 0955   EOSABS 0.0 12/07/2023 1224   EOSABS 0.1 05/10/2023 1417   BASOSABS 0.0 12/07/2023 1224   BASOSABS 0.1 05/10/2023 1417   BASOSABS 0.0 08/31/2007 0955    Lab Results  Component Value Date   HGBA1C <4.2 (L) 09/24/2022       Assessment & Plan Alcoholic Neuropathy Chronic neuropathic pain inadequately managed with current gabapentin regimen.  Would love to place on duloxetine for adjunct therapy to enhance pain control but this is contraindicated with chronic liver disease. - Increase gabapentin to 600 mg thrice daily. -Switch to Lyrica if symptoms are uncontrolled on current regimen.  Cirrhosis Managed with gastroenterologist. -Volume status is good Continue furosemide, spironolactone, PPI  Tobacco Use Disorder Desires smoking cessation for liver transplant eligibility. Wellbutrin considered to aid cessation. - Prescribe Wellbutrin for smoking cessation.  Prolonged QT Interval Prolonged QT interval limits medication options like Remeron for appetite stimulation.  Need for pneumonia vaccine Increased pneumonia risk due to medical history. Vaccination recommended. - Administer pneumonia vaccine.  Insurance Concerns Insurance coverage and Medicare eligibility concerns. Case manager involvement suggested. - Arrange for case manager  consultation to discuss insurance options.      Meds ordered this encounter  Medications   DISCONTD: gabapentin (NEURONTIN) 300 MG capsule    Sig: Take 2 capsules (600 mg total) by mouth 2 (two) times daily.    Dispense:  360 capsule    Refill:  1    Dose increase   furosemide (LASIX) 40 MG tablet    Sig: Take 1 tablet (40 mg total) by mouth daily.    Dispense:  90 tablet    Refill:  1   pantoprazole (PROTONIX) 40 MG tablet    Sig: Take 1 tablet (40 mg total) by mouth daily.    Dispense:  90 tablet    Refill:  1   buPROPion (WELLBUTRIN XL) 150 MG 24 hr tablet    Sig: Take 1 tablet (150 mg total) by mouth daily. For Smoking cessation    Dispense:  90 tablet    Refill:  1   gabapentin (NEURONTIN) 300 MG capsule    Sig: Take 2 capsules (600 mg total) by mouth 3 (three) times daily.    Dispense:  180 capsule    Refill:  5    Dose increase    Follow-up: Return in about 6 months (around 06/12/2024) for Chronic medical conditions.       Hoy Register, MD, FAAFP. [facility] [place], [place] [phone]   12/11/2023, 2:56 PM

## 2023-12-11 NOTE — Patient Instructions (Signed)

## 2023-12-11 NOTE — Progress Notes (Signed)
 PNEUMOCOCCAL CONJUGATE-20  vaccine administered in left deltoid per protocols.  Information sheet given. Patient denies and pain or discomfort at injection site. Tolerated injection well no reaction.

## 2023-12-11 NOTE — Telephone Encounter (Addendum)
 Patient in the office today for scheduled OV. Patient voiced that he wanted to change his insurance, because he cannot seem to get any help from the people he has now.  Voiced he needs to go to the dentist and has not been able to get any one to take his insurance. Advised patient he has [employer] managed Medicaid and there are resources available for him. Patient agreed with me making a referral to [facility] to assist him. I also gave patient a list of dentist that are accepting Medicaid.

## 2024-01-01 ENCOUNTER — Other Ambulatory Visit: Payer: Self-pay

## 2024-01-01 ENCOUNTER — Emergency Department (HOSPITAL_COMMUNITY)
Admission: EM | Admit: 2024-01-01 | Discharge: 2024-01-01 | Discharge status: Against Medical Advice | Attending: Emergency Medicine | Admitting: Emergency Medicine

## 2024-01-01 ENCOUNTER — Encounter (HOSPITAL_COMMUNITY): Payer: Self-pay

## 2024-01-01 ENCOUNTER — Ambulatory Visit: Payer: Self-pay

## 2024-01-01 DIAGNOSIS — R0602 Shortness of breath: Secondary | ICD-10-CM | POA: Insufficient documentation

## 2024-01-01 DIAGNOSIS — Z5321 Procedure and treatment not carried out due to patient leaving prior to being seen by health care provider: Secondary | ICD-10-CM | POA: Insufficient documentation

## 2024-01-01 DIAGNOSIS — R1909 Other intra-abdominal and pelvic swelling, mass and lump: Secondary | ICD-10-CM | POA: Diagnosis present

## 2024-01-01 LAB — COMPREHENSIVE METABOLIC PANEL WITH GFR
ALT: 23 U/L (ref 0–44)
AST: 31 U/L (ref 15–41)
Albumin: 3.1 g/dL — ABNORMAL LOW (ref 3.5–5.0)
Alkaline Phosphatase: 103 U/L (ref 38–126)
Anion gap: 8 (ref 5–15)
BUN: 9 mg/dL (ref 6–20)
CO2: 19 mmol/L — ABNORMAL LOW (ref 22–32)
Calcium: 9 mg/dL (ref 8.9–10.3)
Chloride: 111 mmol/L (ref 98–111)
Creatinine, Ser: 0.92 mg/dL (ref 0.61–1.24)
GFR, Estimated: >60 mL/min (ref >60)
Glucose, Bld: 102 mg/dL — ABNORMAL HIGH (ref 70–99)
Potassium: 3.4 mmol/L — ABNORMAL LOW (ref 3.5–5.1)
Sodium: 138 mmol/L (ref 135–145)
Total Bilirubin: 1.7 mg/dL — ABNORMAL HIGH (ref 0.0–1.2)
Total Protein: 6.9 g/dL (ref 6.5–8.1)

## 2024-01-01 LAB — CBC
HCT: 39.2 % (ref 39.0–52.0)
Hemoglobin: 13 g/dL (ref 13.0–17.0)
MCH: 29.7 pg (ref 26.0–34.0)
MCHC: 33.2 g/dL (ref 30.0–36.0)
MCV: 89.5 fL (ref 80.0–100.0)
Platelets: 80 10*3/uL — ABNORMAL LOW (ref 150–400)
RBC: 4.38 MIL/uL (ref 4.22–5.81)
RDW: 16.3 % — ABNORMAL HIGH (ref 11.5–15.5)
WBC: 5.3 10*3/uL (ref 4.0–10.5)
nRBC: 0 % (ref 0.0–0.2)

## 2024-01-01 LAB — PROTIME-INR
INR: 1.5 — ABNORMAL HIGH (ref 0.8–1.2)
Prothrombin Time: 18.3 s — ABNORMAL HIGH (ref 11.4–15.2)

## 2024-01-01 LAB — URINALYSIS, ROUTINE W REFLEX MICROSCOPIC
Bilirubin Urine: NEGATIVE
Glucose, UA: NEGATIVE mg/dL
Hgb urine dipstick: NEGATIVE
Ketones, ur: NEGATIVE mg/dL
Leukocytes,Ua: NEGATIVE
Nitrite: NEGATIVE
Protein, ur: NEGATIVE mg/dL
Specific Gravity, Urine: 1.025 (ref 1.005–1.030)
pH: 5 (ref 5.0–8.0)

## 2024-01-01 LAB — LIPASE, BLOOD: Lipase: 36 U/L (ref 11–51)

## 2024-01-01 NOTE — Telephone Encounter (Signed)
 Copied from CRM [id number]. Topic: Clinical - Red Word Triage >> Jan 01, 2024 10:33 AM Geroge Baseman wrote: Red Word that prompted transfer to Nurse Triage: patient is having trouble with his medications and they are not working. Legs are in constant pain, stomach is bloated and very uncomfortable. Looking for some advise.   Chief Complaint: Abdominal bloating, bilateral leg pain Symptoms: bilateral leg pain,  Frequency: 2-3 weeks Pertinent Negatives: Patient denies belching, fever, vomiting,  Disposition: [x] ED /[] Urgent Care (no appt availability in office) / [] Appointment(In office/virtual)/ []  [facility]/ [] Home Care/ [] Refused Recommended Disposition /[] [facility]/ []  Follow-up with PCP Additional Notes: Patient called and advised   Patient states that his abdomen is swelling and he thinks his fluid needs to be checked. He said that his bowel habits have changed.  He states he has bowel movements.  Patient states he was given a different kind of Gabapentin and he is very upset about not having adequate pain medication. Patient states that he had a procedure recently that he thinks messed up his stomach. Patient states that he is starting to feel like he is breathing harder due to his stomach bloating. Patient states he has had a tube put in his stomach to drain the fluid before and he states that he is starting to feel that way again. Patient is advised that if his abdomen is swelling up this much to start causing him trouble breathing it is recommended at this time that he is seen by a provider in the next 4 hours.  He is advised that there is no availability in his PCP office in the next 4 hours and he states that he is probably going to go back to the Emergency Room.  He is advised that this would be a good idea since he is saying his abdomen is feeling like how it was when he had to have fluid taken off of it in the past.  He also states that his legs are in severe pain  that medication is not helping. Patient states that since he has started taking Gabapentin instead of the other pain medication he was on his legs have been hurting at a level 10 out of 10.  He is upset that his pain is not being controlled. Patient asked for his Gastroenterologist's phone number and this was given to him and someone with the patient repeated the phone number to verify.  Patient also states that whenever he blows his nose there's blood.  He is advised to tell this to the provider he sees today as well. Patient states that he is going to go to the Emergency Room. Patient is also advised that if anything worsens to call an ambulance to take him to the Emergency Room.   Reason for Disposition  [1] MODERATE-SEVERE SWELLING of abdomen (e.g., looks very distended or swollen) AND [2] NEW-onset or much worse  [1] SEVERE pain (e.g., excruciating, unable to do any normal activities) AND [2] not improved after 2 hours of pain medicine  Answer Assessment - Initial Assessment Questions 1. SYMPTOM: "What's the main symptom you're concerned about?" (e.g., abdomen bloating, swelling)     Abdominal bloating 2. ONSET: "When did bloating  start?"     2-3 weeks 3. SEVERITY: "How bad is the bloating or swelling?"    - BLOATING: Feels gassy or bloated. No visible swelling.     - MILD SWELLING: Feels gassy or bloated. Abdomen looks mildly distended or swollen.    -  MODERATE - SEVERE SWELLING: Abdomen looks very distended or swollen.      Unsure exactly 4. ABDOMEN PAIN:  "Is there any abdomen pain?" If Yes, ask: "How bad is the pain?"  (e.g., Scale 1-10; mild, moderate, or severe)   - NONE (0): No pain.   - MILD (1-3): Doesn't interfere with normal activities, abdomen soft and not tender to touch.    - MODERATE (4-7): Interferes with normal activities or awakens from sleep, abdomen tender to touch.    - SEVERE (8-10): Excruciating pain, doubled over, unable to do any normal activities.       Just  when he takes all his medicine 5. RELIEVING AND AGGRAVATING FACTORS: "What makes it better or worse?" (e.g., certain foods, lactose, medicines)     N/a 6. GI HISTORY: "Do you have any history of stomach or intestine problems?" (e.g., bowel obstruction, cancer, irritable bowel)      Kidney transplant list, abdominal procedures recently 7. CAUSE: "What do you think is causing the bloating?"      "Fluid" 8. OTHER SYMPTOMS: "Do you have any other symptoms?" (e.g., belching, blood in stool, breathing difficulty, constipation, diarrhea, fever, passing gas, vomiting, weight loss, white of eyes have turned yellow)     Breathing difficulty  Answer Assessment - Initial Assessment Questions 1. ONSET: "When did the pain start?"      Since he was given a different Gabapentin 2. LOCATION: "Where is the pain located?"      Both legs 3. PAIN: "How bad is the pain?"    (Scale 1-10; or mild, moderate, severe)   -  MILD (1-3): doesn't interfere with normal activities    -  MODERATE (4-7): interferes with normal activities (e.g., work or school) or awakens from sleep, limping    -  SEVERE (8-10): excruciating pain, unable to do any normal activities, unable to walk     10 4. WORK OR EXERCISE: "Has there been any recent work or exercise that involved this part of the body?"      Daily use 5. CAUSE: "What do you think is causing the leg pain?"      6. OTHER SYMPTOMS: "Do you have any other symptoms?" (e.g., chest pain, back pain, breathing difficulty, swelling, rash, fever, numbness, weakness)     Breathing difficulty sometimes when he leans over due to his abdomen swelling, pt states consistent bloody nose  Protocols used: Leg Pain-A-AH, Abdomen Bloating and Swelling-A-AH

## 2024-01-01 NOTE — ED Triage Notes (Addendum)
 Pt presents with abd swelling x 2 weeks ago. He does have some associated ShOB that worsens with the abd swelling. He has a hx of paracentesis and liver disease. Pt is a poor historian. He states his neuropathy is getting worse and he is having nose bleeds. His PCP is treating him already for the neuropathy and nose bleeds.

## 2024-01-01 NOTE — Telephone Encounter (Signed)
 Inbound call from patient requesting to speak with a nurse in regards to him having abdominal ain. Please advise.   Thank you

## 2024-01-01 NOTE — Telephone Encounter (Signed)
Attempted to reach patient. No answer, voicemail not set up.
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
## 2024-01-02 ENCOUNTER — Other Ambulatory Visit: Payer: Self-pay | Admitting: Family Medicine

## 2024-01-02 DIAGNOSIS — Z720 Tobacco use: Secondary | ICD-10-CM

## 2024-01-02 DIAGNOSIS — G621 Alcoholic polyneuropathy: Secondary | ICD-10-CM

## 2024-01-02 DIAGNOSIS — K7011 Alcoholic hepatitis with ascites: Secondary | ICD-10-CM

## 2024-01-02 DIAGNOSIS — K7031 Alcoholic cirrhosis of liver with ascites: Secondary | ICD-10-CM

## 2024-01-02 MED ORDER — LACTULOSE 10 GM/15ML PO SOLN: 20.0000 g | Freq: Two times a day (BID) | ORAL | 2 refills | Status: DC

## 2024-01-02 NOTE — Telephone Encounter (Signed)
 This encounter was created in error - please disregard.

## 2024-01-02 NOTE — Telephone Encounter (Signed)
 Again attempted to reach patient. Phone goes immediately to a message indicating the voicemail has not been set up.
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
## 2024-01-03 ENCOUNTER — Telehealth: Payer: Self-pay

## 2024-01-03 DIAGNOSIS — G621 Alcoholic polyneuropathy: Secondary | ICD-10-CM

## 2024-01-03 DIAGNOSIS — K7031 Alcoholic cirrhosis of liver with ascites: Secondary | ICD-10-CM

## 2024-01-03 DIAGNOSIS — S161XXA Strain of muscle, fascia and tendon at neck level, initial encounter: Secondary | ICD-10-CM

## 2024-01-03 DIAGNOSIS — K7011 Alcoholic hepatitis with ascites: Secondary | ICD-10-CM

## 2024-01-03 DIAGNOSIS — Z720 Tobacco use: Secondary | ICD-10-CM

## 2024-01-03 DIAGNOSIS — D638 Anemia in other chronic diseases classified elsewhere: Secondary | ICD-10-CM
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
## 2024-01-03 NOTE — Telephone Encounter (Signed)
 Routing to PCP for approval.

## 2024-01-03 NOTE — Telephone Encounter (Signed)
 3rd attempt to reach patient. No answer and no voicemail. Will send a mychart response (although it appears patient may not view this much either). Will await return correspondence before continuing additional efforts to contact patient.

## 2024-01-04 MED ORDER — THIAMINE HCL 100 MG PO TABS: 100.0000 mg | ORAL_TABLET | Freq: Every day | ORAL | 1 refills | Status: AC

## 2024-01-04 MED ORDER — PANTOPRAZOLE SODIUM 40 MG PO TBEC: 40.0000 mg | DELAYED_RELEASE_TABLET | Freq: Every day | ORAL | 1 refills | Status: AC

## 2024-01-04 MED ORDER — LACTULOSE 10 GM/15ML PO SOLN: 20.0000 g | Freq: Two times a day (BID) | ORAL | 2 refills | Status: DC

## 2024-01-04 MED ORDER — BUPROPION HCL ER (XL) 150 MG PO TB24
150.0000 mg | ORAL_TABLET | Freq: Every day | ORAL | 3 refills | Status: AC
Start: 2024-01-04 — End: ?

## 2024-01-04 MED ORDER — METHOCARBAMOL 500 MG PO TABS: 1000.0000 mg | ORAL_TABLET | Freq: Three times a day (TID) | ORAL | 3 refills | Status: DC | PRN

## 2024-01-04 MED ORDER — PROPRANOLOL HCL 10 MG PO TABS: 10.0000 mg | ORAL_TABLET | Freq: Two times a day (BID) | ORAL | 3 refills | Status: DC

## 2024-01-04 MED ORDER — GABAPENTIN 300 MG PO CAPS: 600.0000 mg | ORAL_CAPSULE | Freq: Three times a day (TID) | ORAL | 3 refills | Status: DC

## 2024-01-04 MED ORDER — FUROSEMIDE 40 MG PO TABS
40.0000 mg | ORAL_TABLET | Freq: Every day | ORAL | 1 refills | Status: DC
Start: 2024-01-04 — End: 2024-02-08

## 2024-01-04 MED ORDER — SPIRONOLACTONE 25 MG PO TABS: 12.5000 mg | ORAL_TABLET | Freq: Every day | ORAL | 3 refills | Status: AC

## 2024-01-04 NOTE — Telephone Encounter (Signed)
 Refills have been sent to pharmacy

## 2024-01-04 NOTE — Addendum Note (Signed)
 Addended by: Hoy Register on: 01/04/2024 12:45 PM   Modules accepted: Orders

## 2024-01-04 NOTE — Telephone Encounter (Signed)
 Patient has been informed.

## 2024-01-08 DIAGNOSIS — I8511 Secondary esophageal varices with bleeding: Secondary | ICD-10-CM | POA: Diagnosis not present

## 2024-01-08 DIAGNOSIS — K766 Portal hypertension: Secondary | ICD-10-CM | POA: Diagnosis not present

## 2024-01-08 DIAGNOSIS — E43 Unspecified severe protein-calorie malnutrition: Secondary | ICD-10-CM | POA: Diagnosis not present

## 2024-01-08 DIAGNOSIS — K3189 Other diseases of stomach and duodenum: Secondary | ICD-10-CM | POA: Diagnosis not present

## 2024-01-08 DIAGNOSIS — K7030 Alcoholic cirrhosis of liver without ascites: Secondary | ICD-10-CM | POA: Diagnosis not present

## 2024-01-08 DIAGNOSIS — Z9189 Other specified personal risk factors, not elsewhere classified: Secondary | ICD-10-CM | POA: Diagnosis not present

## 2024-01-31 ENCOUNTER — Ambulatory Visit: Payer: Self-pay

## 2024-01-31 NOTE — Telephone Encounter (Signed)
 Summary: nose bleed concern   Copied From CRM [id number]. Reason for Triage: The patient shares that they have began to experience a nosebleed when they return inside from being out, the patient shares that they have also been experiencing decreased energy and tiredness. Please contact the patient to discuss further when possible      Chief Complaint: Nosebleeds  Symptoms: Nosebleeds Frequency: Intermittent  Disposition: [] ED /[] Urgent Care (no appt availability in office) / [x] Appointment(In office/virtual)/ []  [facility]/ [] Home Care/ [] Refused Recommended Disposition /[] [facility]/ []  Follow-up with PCP Additional Notes: Patient states he has had nosebleeds daily for the last few days. He states that he is able to get the bleeding to stop but that it has been recurrent.  Appointment made for next week per patient's request for evaluation of his symptoms.    Reason for Disposition  [1] Mild-moderate nosebleed AND [2] bleeding stopped now  Answer Assessment - Initial Assessment Questions 1. AMOUNT OF BLEEDING: "How bad is the bleeding?" "How much blood was lost?" "Has the bleeding stopped?"   - MILD: needed a couple tissues   - MODERATE: needed many tissues   - SEVERE: large blood clots, soaked many tissues, lasted more than 30 minutes      Mild to moderate  2. ONSET: "When did the nosebleed start?"      Has nosebleeds daily  3. FREQUENCY: "How many nosebleeds have you had in the last 24 hours?"      Intermittent  4. RECURRENT SYMPTOMS: "Have there been other recent nosebleeds?" If Yes, ask: "How long did it take you to stop the bleeding?" "What worked best?"      Yes 5. CAUSE: "What do you think caused this nosebleed?"     Unsure  6. LOCAL FACTORS: "Do you have any cold symptoms?", "Have you been rubbing or picking at your nose?"     Experiencing some allergies  7. SYSTEMIC FACTORS: "Do you have high blood pressure or any bleeding problems?"     High blood  pressure  8. BLOOD THINNERS: "Do you take any blood thinners?" (e.g., aspirin , clopidogrel / Plavix, coumadin, heparin ). Notes: Other strong blood thinners include: Arixtra (fondaparinux), Eliquis (apixaban), Pradaxa (dabigatran), and Xarelto (rivaroxaban).     No 9. OTHER SYMPTOMS: "Do you have any other symptoms?" (e.g., lightheadedness)     Headache  Protocols used: Nosebleed-A-AH

## 2024-02-01 NOTE — Telephone Encounter (Signed)
 Noted.

## 2024-02-08 ENCOUNTER — Encounter: Payer: Self-pay | Admitting: Physician Assistant

## 2024-02-08 ENCOUNTER — Ambulatory Visit: Attending: Physician Assistant | Admitting: Physician Assistant

## 2024-02-08 VITALS — BP 120/70 | HR 58 | Resp 19 | Ht 72.0 in | Wt 136.4 lb

## 2024-02-08 DIAGNOSIS — J339 Nasal polyp, unspecified: Secondary | ICD-10-CM | POA: Diagnosis not present

## 2024-02-08 DIAGNOSIS — D696 Thrombocytopenia, unspecified: Secondary | ICD-10-CM

## 2024-02-08 DIAGNOSIS — K7031 Alcoholic cirrhosis of liver with ascites: Secondary | ICD-10-CM

## 2024-02-08 DIAGNOSIS — R040 Epistaxis: Secondary | ICD-10-CM

## 2024-02-08 MED ORDER — FUROSEMIDE 40 MG PO TABS: 40.0000 mg | ORAL_TABLET | Freq: Every day | ORAL | 1 refills | Status: DC

## 2024-02-08 MED ORDER — HUMIDIFIERS MISC: 1.0000 | Freq: Every day | 0 refills | Status: AC

## 2024-02-08 MED ORDER — ACETAMINOPHEN ER 650 MG PO TBCR: 650.0000 mg | EXTENDED_RELEASE_TABLET | Freq: Three times a day (TID) | ORAL | 0 refills | Status: AC | PRN

## 2024-02-08 NOTE — Patient Instructions (Signed)
 Drink 64 to 80 ounces water daily  Use saline nasal spray.    Obtain cold air humidifier

## 2024-02-08 NOTE — Progress Notes (Signed)
 Patient ID: Demorris Janis, male   DOB: [birth year], 58 y.o.   MRN: [id number]   Mathias Cicero, is a 58 y.o. male  [id number]  [id number]  DOB - 1965/12/03  No chief complaint on file.      Subjective:   Richard [name] is a 58 y.o. male here today for a fdaily nose bleeds for a few weeks.  They last anywhere from 1 to 10 mins-usu just a few mins and stop with pressure.  Has h/o low platelets.  Denies current or recent alcohol  use(has alcoholic liver disease).  Denies using aspirin /goody's BC powder, etc.  Denies cocaine.  Requesting hydrocodone  to take for general aches and pains.  Says nose bleeds occur if he sneezes or when he goes outside for the first time daily.  Also says "I feel something that feels different"  in R nostril.  Denies gums bleeding.  Denies melena or hematochezia.  He does had egd scheduled late this month.  Nosebleeds stop with pressure after a few mins  No problems updated.  ALLERGIES: Allergies  Allergen Reactions   Aspirin  Other (See Comments)    Caused acid reflux   Penicillins Hives    Has patient had a PCN reaction causing immediate rash, facial/tongue/throat swelling, SOB or lightheadedness with hypotension: yes Has patient had a PCN reaction causing severe rash involving mucus membranes or skin necrosis: no Has patient had a PCN reaction that required hospitalization: no Has patient had a PCN reaction occurring within the last 10 years: no If all of the above answers are "NO", then may proceed with Cephalosporin use.     PAST MEDICAL HISTORY: Past Medical History:  Diagnosis Date   Abnormal nuclear cardiac imaging test    Acid reflux    Acute pancreatitis 08/14/2018   Alcohol  withdrawal delirium (HCC) 08/20/2016   Alcoholic cardiomyopathy (HCC) 64/40/3474   Alcoholic hepatitis    Alcoholic ketoacidosis 11/13/2017   Ascites 12/13/2019   Aspiration pneumonia (HCC) 08/20/2016   Chest pain of uncertain etiology    Chronic systolic CHF  (congestive heart failure) (HCC)    Cirrhosis (HCC)    Colon cancer (HCC)    DCM (dilated cardiomyopathy) (HCC)    Drug abuse (HCC) 01/07/2020   Elevated troponin 06/27/2018   ETOH abuse    Gastropathy 08/14/2018   Heme positive stool 11/13/2017   Hepatic steatosis 08/21/2016   High anion gap metabolic acidosis 11/05/2018   History of colon cancer    HTN (hypertension)    Hypertensive urgency 06/27/2018   Hypoglycemia 06/27/2018   Hypokalemia 01/07/2020   Hypomagnesemia    Hypophosphatemia    Lactic acidosis 08/20/2016   Leukocytosis 01/07/2020   Neuropathy    Pancreatitis 08/2018   Polyp of ascending colon    Prolonged Q-T interval on ECG    Prolonged QT interval    Protein-calorie malnutrition, severe 05/02/2018   PUD (peptic ulcer disease)    SBP (spontaneous bacterial peritonitis) (HCC) 01/07/2020   Sepsis (HCC) 08/20/2016   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 Authorizing Provider  gabapentin  (NEURONTIN ) 300 MG capsule Take 2 capsules (600 mg total) by mouth 3 (three) times daily. 01/04/24  Yes Newlin, Enobong, MD  Humidifiers MISC 1 each by Does not apply route daily. 02/08/24  Yes Kemba Hoppes, Stan Eans, PA-C  Iron , Ferrous Sulfate , 325 (65 Fe) MG  TABS Take 1 tablet by mouth in the morning and at bedtime. 12/07/23  Yes Tobin Forts, MD  methocarbamol  (ROBAXIN ) 500 MG tablet Take 2 tablets (1,000 mg total) by mouth every 8 (eight) hours as needed. For muscle pain 01/04/24  Yes Newlin, Enobong, MD  pantoprazole  (PROTONIX ) 40 MG tablet Take 1 tablet (40 mg total) by mouth daily. 01/04/24  Yes Newlin, Enobong, MD  propranolol  (INDERAL ) 10 MG tablet Take 1 tablet (10 mg total) by mouth 2 (two) times daily. 01/04/24  Yes Newlin, Enobong, MD  acetaminophen  (TYLENOL ) 650 MG CR tablet Take 1 tablet (650 mg total) by  mouth every 8 (eight) hours as needed for pain. 02/08/24   Hassie Lint, PA-C  albuterol  (VENTOLIN  HFA) 108 (90 Base) MCG/ACT inhaler Inhale 1-2 puffs into the lungs every 6 (six) hours as needed for wheezing or shortness of breath. Patient not taking: Reported on 02/08/2024 07/05/23   Starlene Eaton, FNP  buPROPion  (WELLBUTRIN  XL) 150 MG 24 hr tablet Take 1 tablet (150 mg total) by mouth daily. For Smoking cessation Patient not taking: Reported on 02/08/2024 01/04/24   Newlin, Enobong, MD  diclofenac  Sodium (VOLTAREN ) 1 % GEL Apply 2 g topically 4 (four) times daily as needed (pain). Patient not taking: Reported on 02/08/2024    [provider]  ferrous gluconate  (FERGON) 324 MG tablet Take 1 tablet (324 mg total) by mouth 2 (two) times daily with a meal. Patient not taking: Reported on 02/08/2024 07/19/23   Ezzard Holms, MD  fluticasone [facility]) 50 MCG/ACT nasal spray Place 1 spray into both nostrils daily as needed for allergies or rhinitis. Patient not taking: Reported on 02/08/2024    [provider]  folic acid  (FOLVITE ) 1 MG tablet Take 1 tablet (1 mg total) by mouth daily. Patient not taking: Reported on 02/08/2024 07/19/23   Ezzard Holms, MD  furosemide  (LASIX ) 40 MG tablet Take 1 tablet (40 mg total) by mouth daily. 02/08/24   Hassie Lint, PA-C  lactulose  (CHRONULAC ) 10 GM/15ML solution Take 30 mLs (20 g total) by mouth 2 (two) times daily. Patient not taking: Reported on 02/08/2024 01/04/24   Newlin, Enobong, MD  spironolactone  (ALDACTONE ) 25 MG tablet Take 0.5 tablets (12.5 mg total) by mouth daily. Patient not taking: Reported on 02/08/2024 01/04/24   Newlin, Enobong, MD  thiamine  (VITAMIN B1) 100 MG tablet Take 1 tablet (100 mg total) by mouth daily. Patient not taking: Reported on 02/08/2024 01/04/24   Newlin, Enobong, MD    ROS: Neg HEENT Neg resp Neg cardiac Neg GI Neg GU Neg MS Neg psych Neg neuro  Objective:   Vitals:   02/08/24 1021  BP: 120/70   Pulse: (!) 58  Resp: 19  SpO2: 98%  Weight: 136 lb 6.4 oz (61.9 kg)  Height: 6' (1.829 m)   Exam General appearance : Awake, alert, not in any distress. Speech Clear. Not toxic looking HEENT: Atraumatic and Normocephalic, small ~74mm polyp irregular surface along septum of R side of nose that appears to be site of bleeding Neck: Supple, no JVD. No cervical lymphadenopathy.  Chest: Good air entry bilaterally, CTAB.  No rales/rhonchi/wheezing CVS: S1 S2 regular, no murmurs.  Extremities: B/L Lower Ext shows no edema, both legs are warm to touch Neurology: Awake alert, and oriented X 3, CN II-XII intact, Non focal Skin: No Rash  Data Review Lab Results  Component Value Date   HGBA1C <4.2 (L) 09/24/2022   HGBA1C 4.6 11/06/2020  HGBA1C <4.2 (L) 01/11/2020    Assessment & Plan   1. Epistaxis (Primary) Avoid alcohol , aspirin , goody's/BC powders etc.  Verbalizes understanding - CBC with Differential/Platelet - Ambulatory referral to ENT - Humidifiers MISC; 1 each by Does not apply route daily.  Dispense: 1 each; Refill: 0  2. Nasal polyp - CBC with Differential/Platelet - Ambulatory referral to ENT - Humidifiers MISC; 1 each by Does not apply route daily.  Dispense: 1 each; Refill: 0  3. Thrombocytopenia (HCC) - CBC with Differential/Platelet - Ambulatory referral to ENT  4. Alcoholic cirrhosis of liver with ascites (HCC) - furosemide  (LASIX ) 40 MG tablet; Take 1 tablet (40 mg total) by mouth daily.  Dispense: 90 tablet; Refill: 1    Return for PCP for chronic conditions-as next scheduled.  The patient was given clear instructions to go to ER or return to medical center if symptoms don't improve, worsen or new problems develop. The patient verbalized understanding. The patient was told to call to get lab results if they haven't heard anything in the next week.      Dulce Gibbs, PA-C [facility] [place], [place] [phone]    02/08/2024, 10:55 AM

## 2024-02-09 LAB — CBC WITH DIFFERENTIAL/PLATELET
Basophils Absolute: 0.1 10*3/uL (ref 0.0–0.2)
Basos: 1 %
EOS (ABSOLUTE): 0.1 10*3/uL (ref 0.0–0.4)
Eos: 3 %
Hematocrit: 35.8 % — ABNORMAL LOW (ref 37.5–51.0)
Hemoglobin: 11.7 g/dL — ABNORMAL LOW (ref 13.0–17.7)
Immature Grans (Abs): 0 10*3/uL (ref 0.0–0.1)
Immature Granulocytes: 0 %
Lymphocytes Absolute: 1.6 10*3/uL (ref 0.7–3.1)
Lymphs: 32 %
MCH: 29.7 pg (ref 26.6–33.0)
MCHC: 32.7 g/dL (ref 31.5–35.7)
MCV: 91 fL (ref 79–97)
Monocytes Absolute: 0.6 10*3/uL (ref 0.1–0.9)
Monocytes: 11 %
Neutrophils Absolute: 2.6 10*3/uL (ref 1.4–7.0)
Neutrophils: 53 %
Platelets: 145 10*3/uL — ABNORMAL LOW (ref 150–450)
RBC: 3.94 x10E6/uL — ABNORMAL LOW (ref 4.14–5.80)
RDW: 14 % (ref 11.6–15.4)
WBC: 4.9 10*3/uL (ref 3.4–10.8)

## 2024-02-13 ENCOUNTER — Ambulatory Visit: Payer: Self-pay

## 2024-02-13 ENCOUNTER — Emergency Department (HOSPITAL_COMMUNITY)
Admission: EM | Admit: 2024-02-13 | Discharge: 2024-02-13 | Disposition: A | Discharge status: Home / Self Care | Attending: Emergency Medicine | Admitting: Emergency Medicine

## 2024-02-13 ENCOUNTER — Ambulatory Visit: Payer: Self-pay | Admitting: *Deleted

## 2024-02-13 ENCOUNTER — Other Ambulatory Visit: Payer: Self-pay

## 2024-02-13 ENCOUNTER — Encounter (HOSPITAL_COMMUNITY): Payer: Self-pay

## 2024-02-13 DIAGNOSIS — Z720 Tobacco use: Secondary | ICD-10-CM | POA: Diagnosis not present

## 2024-02-13 DIAGNOSIS — Z85038 Personal history of other malignant neoplasm of large intestine: Secondary | ICD-10-CM | POA: Insufficient documentation

## 2024-02-13 DIAGNOSIS — Z79899 Other long term (current) drug therapy: Secondary | ICD-10-CM | POA: Insufficient documentation

## 2024-02-13 DIAGNOSIS — I5020 Unspecified systolic (congestive) heart failure: Secondary | ICD-10-CM | POA: Diagnosis not present

## 2024-02-13 DIAGNOSIS — I110 Hypertensive heart disease with heart failure: Secondary | ICD-10-CM | POA: Insufficient documentation

## 2024-02-13 DIAGNOSIS — K0889 Other specified disorders of teeth and supporting structures: Secondary | ICD-10-CM

## 2024-02-13 DIAGNOSIS — K029 Dental caries, unspecified: Secondary | ICD-10-CM | POA: Insufficient documentation

## 2024-02-13 MED ORDER — OXYCODONE-ACETAMINOPHEN 5-325 MG PO TABS
1.0000 | ORAL_TABLET | Freq: Once | ORAL | Status: AC
  Administered 2024-02-13: 1 via ORAL
  Filled 2024-02-13: qty 1

## 2024-02-13 MED ORDER — CLINDAMYCIN HCL 300 MG PO CAPS: 300.0000 mg | ORAL_CAPSULE | Freq: Four times a day (QID) | ORAL | 0 refills | Status: AC

## 2024-02-13 MED ORDER — LIDOCAINE VISCOUS HCL 2 % MT SOLN
15.0000 mL | Freq: Once | OROMUCOSAL | Status: AC
  Administered 2024-02-13: 15 mL via OROMUCOSAL
  Filled 2024-02-13: qty 15

## 2024-02-13 MED ORDER — NAPROXEN 500 MG PO TABS: 500.0000 mg | ORAL_TABLET | Freq: Two times a day (BID) | ORAL | 0 refills | Status: DC

## 2024-02-13 NOTE — ED Provider Notes (Signed)
  EMERGENCY DEPARTMENT AT [facility] Provider Note   CSN: [id number] Arrival date & time: 02/13/24  1420     History  Chief Complaint  Patient presents with   Dental Pain    Richard [name] is a 58 y.o. male with past medical history of alcohol  abuse, thrombocytopenia, sepsis, pancreatitis, tobacco use, HTN, colon cancer, prolonged QT, systolic heart failure presents emergency department for evaluation of dental pain that has been occurring over the past few months but worsening over the past week.  He had a x-ray conducted by dentist who stated that he needed 6 teeth pulled.  He has not returned to this dentist.  Complains of constant dental pain for the past week.  Has tried ibuprofen and Tylenol  at home without relief.  Denies fevers, difficulty swallowing, shob   Dental Pain Associated symptoms: no fever and no headaches       Home Medications Prior to Admission medications   Medication Sig Start Date End Date Taking? Authorizing Provider  clindamycin  (CLEOCIN ) 300 MG capsule Take 1 capsule (300 mg total) by mouth every 6 (six) hours for 7 days. 02/13/24 02/20/24 Yes Royann Cords, PA  naproxen  (NAPROSYN ) 500 MG tablet Take 1 tablet (500 mg total) by mouth 2 (two) times daily. 02/13/24  Yes Royann Cords, PA  acetaminophen  (TYLENOL ) 650 MG CR tablet Take 1 tablet (650 mg total) by mouth every 8 (eight) hours as needed for pain. 02/08/24   Hassie Lint, PA-C  albuterol  (VENTOLIN  HFA) 108 (90 Base) MCG/ACT inhaler Inhale 1-2 puffs into the lungs every 6 (six) hours as needed for wheezing or shortness of breath. Patient not taking: Reported on 02/08/2024 07/05/23   Starlene Eaton, FNP  buPROPion  (WELLBUTRIN  XL) 150 MG 24 hr tablet Take 1 tablet (150 mg total) by mouth daily. For Smoking cessation Patient not taking: Reported on 02/08/2024 01/04/24   Newlin, Enobong, MD  diclofenac  Sodium (VOLTAREN ) 1 % GEL Apply 2 g topically 4 (four) times daily as  needed (pain). Patient not taking: Reported on 02/08/2024    [provider]  ferrous gluconate  (FERGON) 324 MG tablet Take 1 tablet (324 mg total) by mouth 2 (two) times daily with a meal. Patient not taking: Reported on 02/08/2024 07/19/23   Ezzard Holms, MD  fluticasone [facility]) 50 MCG/ACT nasal spray Place 1 spray into both nostrils daily as needed for allergies or rhinitis. Patient not taking: Reported on 02/08/2024    [provider]  folic acid  (FOLVITE ) 1 MG tablet Take 1 tablet (1 mg total) by mouth daily. Patient not taking: Reported on 02/08/2024 07/19/23   Ezzard Holms, MD  furosemide  (LASIX ) 40 MG tablet Take 1 tablet (40 mg total) by mouth daily. 02/08/24   Hassie Lint, PA-C  gabapentin  (NEURONTIN ) 300 MG capsule Take 2 capsules (600 mg total) by mouth 3 (three) times daily. 01/04/24   Newlin, Enobong, MD  Humidifiers MISC 1 each by Does not apply route daily. 02/08/24   Hassie Lint, PA-C  Iron , Ferrous Sulfate , 325 (65 Fe) MG TABS Take 1 tablet by mouth in the morning and at bedtime. 12/07/23   Tobin Forts, MD  lactulose  (CHRONULAC ) 10 GM/15ML solution Take 30 mLs (20 g total) by mouth 2 (two) times daily. Patient not taking: Reported on 02/08/2024 01/04/24   Newlin, Enobong, MD  methocarbamol  (ROBAXIN ) 500 MG tablet Take 2 tablets (1,000 mg total) by mouth every 8 (eight) hours as needed. For  muscle pain 01/04/24   Newlin, Enobong, MD  pantoprazole  (PROTONIX ) 40 MG tablet Take 1 tablet (40 mg total) by mouth daily. 01/04/24   Newlin, Enobong, MD  propranolol  (INDERAL ) 10 MG tablet Take 1 tablet (10 mg total) by mouth 2 (two) times daily. 01/04/24   Newlin, Enobong, MD  spironolactone  (ALDACTONE ) 25 MG tablet Take 0.5 tablets (12.5 mg total) by mouth daily. Patient not taking: Reported on 02/08/2024 01/04/24   Newlin, Enobong, MD  thiamine  (VITAMIN B1) 100 MG tablet Take 1 tablet (100 mg total) by mouth daily. Patient not taking: Reported on 02/08/2024 01/04/24   Newlin,  Enobong, MD      Allergies    Aspirin  and Penicillins    Review of Systems   Review of Systems  Constitutional:  Negative for chills, fatigue and fever.  Respiratory:  Negative for cough, chest tightness, shortness of breath and wheezing.   Cardiovascular:  Negative for chest pain and palpitations.  Gastrointestinal:  Negative for abdominal pain, constipation, diarrhea, nausea and vomiting.  Neurological:  Negative for dizziness, seizures, weakness, light-headedness, numbness and headaches.    Physical Exam Updated Vital Signs BP 117/68 (BP Location: Left Arm)   Pulse 77   Temp 98.8 F (37.1 C) (Oral)   Resp 16   Ht 6' (1.829 m)   Wt 65.8 kg   SpO2 98%   BMI 19.67 kg/m  Physical Exam Vitals and nursing note reviewed.  Constitutional:      General: He is not in acute distress.    Appearance: Normal appearance. He is not ill-appearing.  HENT:     Head: Normocephalic and atraumatic.     Mouth/Throat:     Mouth: Mucous membranes are moist.     Dentition: Abnormal dentition. Does not have dentures. Dental tenderness and dental caries present. No gingival swelling, dental abscesses or gum lesions.     Pharynx: Uvula midline. No pharyngeal swelling, oropharyngeal exudate, posterior oropharyngeal erythema or uvula swelling.     Tonsils: No tonsillar exudate or tonsillar abscesses. 1+ on the right. 1+ on the left.      Comments: Maintaining secretions, no drooling.  Swallows without difficulty.  No submandibular swelling or tenderness. Eyes:     Conjunctiva/sclera: Conjunctivae normal.  Cardiovascular:     Rate and Rhythm: Normal rate.  Pulmonary:     Effort: Pulmonary effort is normal. No respiratory distress.     Breath sounds: Normal breath sounds.  Chest:     Chest wall: No tenderness.  Musculoskeletal:     Cervical back: Normal range of motion and neck supple. No rigidity or tenderness.  Lymphadenopathy:     Cervical: No cervical adenopathy.  Skin:    Capillary  Refill: Capillary refill takes less than 2 seconds.     Coloration: Skin is not jaundiced or pale.  Neurological:     Mental Status: He is alert and oriented to person, place, and time. Mental status is at baseline.     ED Results / Procedures / Treatments   Labs (all labs ordered are listed, but only abnormal results are displayed) Labs Reviewed - No data to display  EKG None  Radiology No results found.  Procedures Procedures    Medications Ordered in ED Medications  oxyCODONE -acetaminophen  (PERCOCET/ROXICET) 5-325 MG per tablet 1 tablet (1 tablet Oral Given 02/13/24 1601)  lidocaine  (XYLOCAINE ) 2 % viscous mouth solution 15 mL (15 mLs Mouth/Throat Given 02/13/24 1602)    ED Course/ Medical Decision Making/ A&P  Medical Decision Making Risk Prescription drug management.   Patient presents to the ED for concern of dental pain, this involves an extensive number of treatment options, and is a complaint that carries with it a high risk of complications and morbidity.  The differential diagnosis includes dental caries, gingival abscess, retropharyngeal abscess, PTA, tonsillar abscess, strep, broken tooth   Co morbidities that complicate the patient evaluation  See HPI   Additional history obtained:  Additional history obtained from Nursing   External records from outside source obtained and reviewed including triage notes.     Medicines ordered and prescription drug management:  I ordered medication including Percocet, lidocaine  mouthwash, clinda, naproxen  for dental pain Reevaluation of the patient after these medicines showed that the patient improved I have reviewed the patients home medicines and have made adjustments as needed     Problem List / ED Course:  Dental caries Dental pain No emergent cause of symptoms.  No signs of tonsillar abscess, retropharyngeal abscess, PTA, Ludwig's angina Maintaining secretions  without difficulty.  Swallows without difficulty nor pain Will provide analgesia and antibiotics.  Provided him clinda as he is allergic to penicillins Discussed that he can follow-up with his dentist that already took his x-rays however he does not want to and wants additional resources.  I provided him with dental clinic resources Also provided symptomatic care to include Orajel, mouthwash, lidocaine  solution, Tylenol    Reevaluation:  After the interventions noted above, I reevaluated the patient and found that they have :improved     Dispostion:  After consideration of the diagnostic results and the patients response to treatment, I feel that the patent would benefit from management with dentist follow-up.   Discussed ED workup, disposition, return to ED precautions with patient who expresses understanding agrees with plan.  All questions answered to their satisfaction.  They are agreeable to plan.  Discharge instructions provided on paperwork  Final Clinical Impression(s) / ED Diagnoses Final diagnoses:  Dental caries  Pain, dental    Rx / DC Orders ED Discharge Orders          Ordered    clindamycin  (CLEOCIN ) 300 MG capsule  Every 6 hours        02/13/24 1554    naproxen  (NAPROSYN ) 500 MG tablet  2 times daily        02/13/24 1554              Royann Cords, PA 02/14/24 0009    Guadalupe Lee, MD 02/20/24 1458

## 2024-02-13 NOTE — ED Triage Notes (Signed)
 Pt to er, pt c/o on and off dental pain, pt states that they have been hurting for months, states that he hasn't been able to find a dentist.

## 2024-02-13 NOTE — Discharge Instructions (Addendum)
 Thank you for letting us  evaluate you today. I have sent a pain medicine and antibiotic to your pharmacy. You may use naproxen and Tylenol  intermittently every 8 hours as needed for pain.  Please do not use naproxen with aspirin , Aleve, ibuprofen, Advil as they are all in the same family.  Return to ED if you experience inability to swallow (causing you to drool or spit our saliva or cannot drink fluids), under jaw swelling, fever >100.4 that is not resolved with tylenol 

## 2024-02-13 NOTE — Telephone Encounter (Signed)
 Chief Complaint: dental pain Symptoms: dental pain, SOB on exertion, decreased PO intake Frequency: dental pain "for a while", worsening for 3 days Pertinent Negatives: Patient denies fever, difficulty swallowing, CP Disposition: [x] ED /[] Urgent Care (no appt availability in office) / [] Appointment(In office/virtual)/ []  [place] [facility]/ [] Home Care/ [] Refused Recommended Disposition /[] [place] [facility]/ []  Follow-up with PCP Additional Notes: Pt reports 10/10 dental pain for 3 days. Pt states he needs 6 upper and lower teeth removed from the back of his mouth. Pt states normally he can control his pain by brushing his teeth and using pain medication but for the last 3 days the pain has been severe. Pt states his teeth are turning black. Pt endorses swelling to the R side of his jaw. Pt denies difficulty swallowing but states he has to "bypass his mouth to do it" and use a straw. Pt endorses SOB on exertion. Pt denies CP but states his "heart beats harshly." Pt endorses decreased PO intake d/t pain and being unable to chew. States he nibbled on food yesterday AM but otherwise has not eaten and is concerned because he has to take his pills with food. RN advised pt he should be seen in the ED. Pt agreeable to that plan. RN advised pt if he worsens or experiences SOB at rest, CP, or becomes unable to swallow he should call 911. Pt verbalized understanding.   Copied from CRM [id number]. Topic: Clinical - Red Word Triage >> Feb 13, 2024 10:08 AM Rosamond Comes wrote: Red Word that prompted transfer to Nurse Triage: patient calling in Teeth in back of mouth severe pain Reason for Disposition  [1] Difficulty breathing AND [2] not severe  Answer Assessment - Initial Assessment Questions 1. ONSET: "When did the mouth start hurting?" (e.g., hours or days ago)      Teeth in the back of his mouth have been hurting "for a while", pt states brushing his teeth or pain medication usually helps. 3 days ago pain  worsened. Pt states 6 teeth need to be removed on the top and the bottom. 2. SEVERITY: "How bad is the pain?" (Scale 1-10; mild, moderate or severe)   - MILD (1-3):  doesn't interfere with eating or normal activities   - MODERATE (4-7): interferes with eating some solids and normal activities   - SEVERE (8-10):  excruciating pain, interferes with most normal activities   - SEVERE DYSPHAGIA: can't swallow liquids, drooling     10/10 3. SORES: "Are there any sores or ulcers in the mouth?" If Yes, ask: "What part of the mouth are the sores in?"     No  4. FEVER: "Do you have a fever?" If Yes, ask: "What is your temperature, how was it measured, and when did it start?"     No  5. CAUSE: "What do you think is causing the mouth pain?"     Dental pain 6. OTHER SYMPTOMS: "Do you have any other symptoms?" (e.g., difficulty breathing)     Difficulty eating and states he has to take pills w/ food. Pt states he "nibbled some yesterday morning." Decreased PO intake. Pt states his teeth are turning black and changing color. Denies difficulty swallowing. But states "I have to bypass my teeth to do so. I have to use a straw", "my jaw on my R side looks swollen a little bit"  "I get short-winded sometimes", states that started before the dental pain. States this happens when he "gets up and moves and then I  have to take a seat. I get tired real fast." Denies CP. Pt states "my heart is beating harsh sometimes."  Protocols used: Mouth Pain-A-AH

## 2024-02-14 ENCOUNTER — Telehealth: Payer: Self-pay | Admitting: Gastroenterology

## 2024-02-14 ENCOUNTER — Encounter (HOSPITAL_COMMUNITY): Payer: Self-pay | Admitting: Internal Medicine

## 2024-02-14 ENCOUNTER — Ambulatory Visit: Payer: Self-pay

## 2024-02-14 NOTE — Telephone Encounter (Signed)
 Copied from CRM [id number]. Topic: Clinical - Red Word Triage >> Feb 14, 2024  9:41 AM Chrystal Crape R wrote: Extreme teeth pain need referral for dentist.   Chief Complaint: Tooth Pain  Symptoms: Difficulty Eating, Mouth Pain  Frequency: Acute  Pertinent Negatives: Patient denies drooling  Disposition: [] ED /[x] Urgent Care (no appt availability in office) / [] Appointment(In office/virtual)/ []  [place]/ [] Home Care/ [] Refused Recommended Disposition /[] [place] [facility]/ [x]  Follow-up with PCP Additional Notes: GC is being triaged for mouth pain, related to his tooth. The patient was recently treated in the ED yesterday and referred to a Dentist that he says is not in service. Recommended the patient be seen for the pain and provided diet recommendations. Advised the patient to contact the dental office again from which he was referred by the ED.   Reason for Disposition  [1] SEVERE mouth pain (e.g., excruciating) AND [2] not improved after 2 hours of pain medicine  Answer Assessment - Initial Assessment Questions 1. ONSET: "When did the mouth start hurting?" (e.g., hours or days ago)      Recently treated in ED yesterday  2. SEVERITY: "How bad is the pain?" (Scale 1-10; mild, moderate or severe)   - MILD (1-3):  doesn't interfere with eating or normal activities   - MODERATE (4-7): interferes with eating some solids and normal activities   - SEVERE (8-10):  excruciating pain, interferes with most normal activities   - SEVERE DYSPHAGIA: can't swallow liquids, drooling     Severe  3. SORES: "Are there any sores or ulcers in the mouth?" If Yes, ask: "What part of the mouth are the sores in?"     No  4. FEVER: "Do you have a fever?" If Yes, ask: "What is your temperature, how was it measured, and when did it start?"     No  5. CAUSE: "What do you think is causing the mouth pain?"     Tooth  6. OTHER SYMPTOMS: "Do you have any other symptoms?" (e.g., difficulty  breathing)     Lightheadedness  Protocols used: Mouth Pain-A-AH

## 2024-02-14 NOTE — ED Provider Notes (Incomplete)
 [place] EMERGENCY DEPARTMENT AT [facility] Provider Note   CSN: [id number] Arrival date & time: 02/13/24  1420     History {Add pertinent medical, surgical, social history, OB history to HPI:1} Chief Complaint  Patient presents with  . Dental Pain    Richard [name] is a 58 y.o. male with past medical history of alcohol  abuse, thrombocytopenia, sepsis, pancreatitis, tobacco use, HTN, colon cancer, prolonged QT, systolic heart failure presents emergency department for evaluation of dental pain that has been occurring over the past few months but worsening over the past week.  He had a x-ray conducted by dentist who stated that he needed 6 teeth pulled.  He has not returned to this dentist.  Complains of constant dental pain for the past week.  Has tried ibuprofen and Tylenol  at home without relief.  Denies fevers, difficulty swallowing, shob   Dental Pain     Home Medications Prior to Admission medications   Medication Sig Start Date End Date Taking? Authorizing Provider  acetaminophen  (TYLENOL ) 650 MG CR tablet Take 1 tablet (650 mg total) by mouth every 8 (eight) hours as needed for pain. 02/08/24   Hassie Lint, PA-C  albuterol  (VENTOLIN  HFA) 108 (90 Base) MCG/ACT inhaler Inhale 1-2 puffs into the lungs every 6 (six) hours as needed for wheezing or shortness of breath. Patient not taking: Reported on 02/08/2024 07/05/23   Starlene Eaton, FNP  buPROPion  (WELLBUTRIN  XL) 150 MG 24 hr tablet Take 1 tablet (150 mg total) by mouth daily. For Smoking cessation Patient not taking: Reported on 02/08/2024 01/04/24   Newlin, Enobong, MD  diclofenac  Sodium (VOLTAREN ) 1 % GEL Apply 2 g topically 4 (four) times daily as needed (pain). Patient not taking: Reported on 02/08/2024    [provider]  ferrous gluconate  (FERGON) 324 MG tablet Take 1 tablet (324 mg total) by mouth 2 (two) times daily with a meal. Patient not taking: Reported on 02/08/2024 07/19/23   Ezzard Holms, MD  fluticasone [facility]) 50 MCG/ACT nasal spray Place 1 spray into both nostrils daily as needed for allergies or rhinitis. Patient not taking: Reported on 02/08/2024    [provider]  folic acid  (FOLVITE ) 1 MG tablet Take 1 tablet (1 mg total) by mouth daily. Patient not taking: Reported on 02/08/2024 07/19/23   Ezzard Holms, MD  furosemide  (LASIX ) 40 MG tablet Take 1 tablet (40 mg total) by mouth daily. 02/08/24   Hassie Lint, PA-C  gabapentin  (NEURONTIN ) 300 MG capsule Take 2 capsules (600 mg total) by mouth 3 (three) times daily. 01/04/24   Newlin, Enobong, MD  Humidifiers MISC 1 each by Does not apply route daily. 02/08/24   Hassie Lint, PA-C  Iron , Ferrous Sulfate , 325 (65 Fe) MG TABS Take 1 tablet by mouth in the morning and at bedtime. 12/07/23   Tobin Forts, MD  lactulose  (CHRONULAC ) 10 GM/15ML solution Take 30 mLs (20 g total) by mouth 2 (two) times daily. Patient not taking: Reported on 02/08/2024 01/04/24   Newlin, Enobong, MD  methocarbamol  (ROBAXIN ) 500 MG tablet Take 2 tablets (1,000 mg total) by mouth every 8 (eight) hours as needed. For muscle pain 01/04/24   Joaquin Mulberry, MD  pantoprazole  (PROTONIX ) 40 MG tablet Take 1 tablet (40 mg total) by mouth daily. 01/04/24   Newlin, Enobong, MD  propranolol  (INDERAL ) 10 MG tablet Take 1 tablet (10 mg total) by mouth 2 (two) times daily. 01/04/24   Newlin, Enobong, MD  spironolactone  (ALDACTONE ) 25 MG tablet Take 0.5 tablets (12.5 mg total) by mouth daily. Patient not taking: Reported on 02/08/2024 01/04/24   Newlin, Enobong, MD  thiamine  (VITAMIN B1) 100 MG tablet Take 1 tablet (100 mg total) by mouth daily. Patient not taking: Reported on 02/08/2024 01/04/24   Newlin, Enobong, MD      Allergies    Aspirin  and Penicillins    Review of Systems   Review of Systems  Physical Exam Updated Vital Signs BP 117/68 (BP Location: Left Arm)   Pulse 77   Temp 98.8 F (37.1 C) (Oral)   Resp 16   Ht 6' (1.829 m)   Wt 65.8 kg    SpO2 98%   BMI 19.67 kg/m  Physical Exam HENT:     Mouth/Throat:     Dentition: Abnormal dentition. Does not have dentures. Dental tenderness and dental caries present. No gingival swelling, dental abscesses or gum lesions.      ED Results / Procedures / Treatments   Labs (all labs ordered are listed, but only abnormal results are displayed) Labs Reviewed - No data to display  EKG None  Radiology No results found.  Procedures Procedures  {Document cardiac monitor, telemetry assessment procedure when appropriate:1}  Medications Ordered in ED Medications - No data to display  ED Course/ Medical Decision Making/ A&P   {   Click here for ABCD2, HEART and other calculatorsREFRESH Note before signing :1}                              Medical Decision Making Risk Prescription drug management.   ***  {Document critical care time when appropriate:1} {Document review of labs and clinical decision tools ie heart score, Chads2Vasc2 etc:1}  {Document your independent review of radiology images, and any outside records:1} {Document your discussion with family members, caretakers, and with consultants:1} {Document social determinants of health affecting pt's care:1} {Document your decision making why or why not admission, treatments were needed:1} Final Clinical Impression(s) / ED Diagnoses Final diagnoses:  None    Rx / DC Orders ED Discharge Orders     None

## 2024-02-14 NOTE — Telephone Encounter (Signed)
 Spoke with patient. Patient was seen in the ED at [facility] for tooth and mouth pain. Patient was given Percocet, lidocaine  mouthwash, 7 days of clindamycin, naproxen for dental pain. Patient voiced that he has not picked of any of the medication from [facility]. Advised patient to start the treatment that has been advised at his ED visit; after he has completed and he has had no improvement please let us  know. Advised that I would  mail him a list of Dentists that accept his insurance. Encouraged to contact a dentist as soon as he received the list as wait time for appointments can be lengthy. Reminded patient of his upcoming with Dr. Legrand Puma for pre-admission for  his  EGD . Patient voiced that he had forgotten about it. Contact information to Dr. Alberta Almond office given to patient for follow-up. Patient was very Adult nurse.

## 2024-02-14 NOTE — Progress Notes (Signed)
 Richard [name]  PCPAdan Holms MD  EKG-07/18/23 [id number] Cath-n/a Stress-n/a ICD/PM-n/a Blood thinner-n/a GLP-1-n/a   HX: HTN,MI,CHF, ETOH Cardiomyopathy,Aspiration PNA, ETOH Hepatitis, SVT, Colon CA. Patient saw cardiology back in 2023 and recommended f/u 1 year, hasn't been back, denies any current cardiac issues. Recently was seen in ED 5/13 for dental pains, they ordered his antiobiotics and pain meds. Per pt still wants to have EGD done.  Anesthesia Review- Yes- if not having cardiac issues okay for procedure, last EGD ok.

## 2024-02-14 NOTE — Telephone Encounter (Signed)
 Procedure:Endoscopy Procedure date: 02/22/24 Procedure location: [facility] Arrival Time: 7:30 am Spoke with the patient Y/N:   NO, 02/14/24 @ 4:45 pm couldn't leave a message voicemail has not been set up yet  Any prep concerns? ___  Has the patient obtained the prep from the pharmacy ? ___ Do you have a care partner and transportation: ___ Any additional concerns? ___

## 2024-02-15 ENCOUNTER — Telehealth: Payer: Self-pay | Admitting: Internal Medicine

## 2024-02-15 ENCOUNTER — Telehealth: Payer: Self-pay | Admitting: Gastroenterology

## 2024-02-15 NOTE — Telephone Encounter (Addendum)
 Procedure:Endoscopy Procedure date: 02/22/24 Procedure location: [facility] Arrival Time: 7:30 am Spoke with the patient Y/N:   No, 02/15/24 @ 8:58 am couldn't leave a message voicemail has not been set up yet,  mychart message sent No, 02/16/24 @ 10:09 am couldn't leave a message voicemail has not been set up yet   Richard [name] talked with patient, see 02/15/24 phone note.  Any prep concerns? ___  Has the patient obtained the prep from the pharmacy ? ___ Do you have a care partner and transportation: ___ Any additional concerns? ___

## 2024-02-15 NOTE — Telephone Encounter (Signed)
 Pt is currently taking clindamycin 300 mg q 6 hours for 7 days RX on 02/13/24. Pt is sched to have EGD with varices band ligation on 02/22/24. Pt should be fine to proceed with procedure as sched. Just needs to follow prep instructions and notify our office with any symptoms listed.   Unable to leave a vm due to mailbox not being set up.

## 2024-02-16 NOTE — Telephone Encounter (Signed)
 Second attempt to reach pt. Voicemail box has not been sat up. Unable to leave vm.

## 2024-02-19 NOTE — Telephone Encounter (Signed)
 Spoke with pt and he is aware.

## 2024-02-22 ENCOUNTER — Other Ambulatory Visit: Payer: Self-pay

## 2024-02-22 ENCOUNTER — Ambulatory Visit (HOSPITAL_COMMUNITY)
Admission: RE | Admit: 2024-02-22 | Discharge: 2024-02-22 | Disposition: A | Discharge status: Home / Self Care | Attending: Internal Medicine | Admitting: Internal Medicine

## 2024-02-22 ENCOUNTER — Encounter (HOSPITAL_COMMUNITY): Payer: Self-pay | Admitting: Internal Medicine

## 2024-02-22 ENCOUNTER — Ambulatory Visit (HOSPITAL_COMMUNITY): Admitting: Anesthesiology

## 2024-02-22 ENCOUNTER — Encounter (HOSPITAL_COMMUNITY)
Admission: RE | Disposition: A | Discharge status: Home / Self Care | Payer: Self-pay | Source: Home / Self Care | Attending: Internal Medicine

## 2024-02-22 DIAGNOSIS — I471 Supraventricular tachycardia, unspecified: Secondary | ICD-10-CM | POA: Diagnosis not present

## 2024-02-22 DIAGNOSIS — K7030 Alcoholic cirrhosis of liver without ascites: Secondary | ICD-10-CM

## 2024-02-22 DIAGNOSIS — I8500 Esophageal varices without bleeding: Secondary | ICD-10-CM | POA: Diagnosis not present

## 2024-02-22 DIAGNOSIS — D696 Thrombocytopenia, unspecified: Secondary | ICD-10-CM | POA: Diagnosis not present

## 2024-02-22 DIAGNOSIS — K760 Fatty (change of) liver, not elsewhere classified: Secondary | ICD-10-CM | POA: Insufficient documentation

## 2024-02-22 DIAGNOSIS — K7460 Unspecified cirrhosis of liver: Secondary | ICD-10-CM | POA: Diagnosis not present

## 2024-02-22 DIAGNOSIS — F17210 Nicotine dependence, cigarettes, uncomplicated: Secondary | ICD-10-CM | POA: Insufficient documentation

## 2024-02-22 DIAGNOSIS — I5022 Chronic systolic (congestive) heart failure: Secondary | ICD-10-CM | POA: Insufficient documentation

## 2024-02-22 DIAGNOSIS — F1290 Cannabis use, unspecified, uncomplicated: Secondary | ICD-10-CM | POA: Diagnosis not present

## 2024-02-22 DIAGNOSIS — I8510 Secondary esophageal varices without bleeding: Secondary | ICD-10-CM | POA: Diagnosis not present

## 2024-02-22 DIAGNOSIS — I252 Old myocardial infarction: Secondary | ICD-10-CM | POA: Diagnosis not present

## 2024-02-22 DIAGNOSIS — K449 Diaphragmatic hernia without obstruction or gangrene: Secondary | ICD-10-CM | POA: Insufficient documentation

## 2024-02-22 DIAGNOSIS — I110 Hypertensive heart disease with heart failure: Secondary | ICD-10-CM | POA: Diagnosis not present

## 2024-02-22 DIAGNOSIS — Z8711 Personal history of peptic ulcer disease: Secondary | ICD-10-CM | POA: Insufficient documentation

## 2024-02-22 DIAGNOSIS — K226 Gastro-esophageal laceration-hemorrhage syndrome: Secondary | ICD-10-CM | POA: Insufficient documentation

## 2024-02-22 DIAGNOSIS — K3189 Other diseases of stomach and duodenum: Secondary | ICD-10-CM | POA: Diagnosis not present

## 2024-02-22 DIAGNOSIS — Z85038 Personal history of other malignant neoplasm of large intestine: Secondary | ICD-10-CM | POA: Diagnosis not present

## 2024-02-22 DIAGNOSIS — K766 Portal hypertension: Secondary | ICD-10-CM | POA: Insufficient documentation

## 2024-02-22 HISTORY — PX: ESOPHAGOGASTRODUODENOSCOPY: SHX5428

## 2024-02-22 HISTORY — PX: ESOPHAGEAL BANDING: SHX5518

## 2024-02-22 SURGERY — EGD (ESOPHAGOGASTRODUODENOSCOPY)
Anesthesia: Monitor Anesthesia Care

## 2024-02-22 MED ORDER — PROPOFOL 500 MG/50ML IV EMUL
INTRAVENOUS | Status: AC
  Filled 2024-02-22: qty 50

## 2024-02-22 MED ORDER — PROPOFOL 10 MG/ML IV BOLUS
INTRAVENOUS | Status: DC | PRN
  Administered 2024-02-22: 10 mg via INTRAVENOUS
  Administered 2024-02-22: 20 mg via INTRAVENOUS
  Administered 2024-02-22: 10 mg via INTRAVENOUS

## 2024-02-22 MED ORDER — LIDOCAINE HCL (CARDIAC) PF 100 MG/5ML IV SOSY
PREFILLED_SYRINGE | INTRAVENOUS | Status: DC | PRN
  Administered 2024-02-22 (×2): 50 mg via INTRAVENOUS

## 2024-02-22 MED ORDER — SODIUM CHLORIDE 0.9 % IV SOLN: INTRAVENOUS | Status: DC

## 2024-02-22 MED ORDER — PROPOFOL 500 MG/50ML IV EMUL
INTRAVENOUS | Status: DC | PRN
  Administered 2024-02-22: 120 ug/kg/min via INTRAVENOUS

## 2024-02-22 MED ORDER — DEXMEDETOMIDINE HCL IN NACL 200 MCG/50ML IV SOLN
INTRAVENOUS | Status: DC | PRN
  Administered 2024-02-22: 4 ug via INTRAVENOUS
  Administered 2024-02-22: 8 ug via INTRAVENOUS

## 2024-02-22 NOTE — Transfer of Care (Signed)
 Immediate Anesthesia Transfer of Care Note  Patient: Richard [name]  Procedure(s) Performed: EGD (ESOPHAGOGASTRODUODENOSCOPY) ESOPHAGOSCOPY, WITH ESOPHAGEAL VARICES BAND LIGATION  Patient Location: PACU and Endoscopy Unit  Anesthesia Type:MAC  Level of Consciousness: oriented, drowsy, and patient cooperative  Airway & Oxygen Therapy: Patient Spontanous Breathing  Post-op Assessment: Report given to RN and Post -op Vital signs reviewed and stable  Post vital signs: Reviewed and stable  Last Vitals:  Vitals Value Taken Time  BP 122/62 02/22/24 0947  Temp    Pulse 63 02/22/24 0949  Resp 21 02/22/24 0949  SpO2 100 % 02/22/24 0949  Vitals shown include unfiled device data.  Last Pain:  Vitals:   02/22/24 0802  TempSrc: Temporal  PainSc: 0-No pain         Complications: No notable events documented.

## 2024-02-22 NOTE — Discharge Instructions (Signed)

## 2024-02-22 NOTE — H&P (Signed)
 HISTORY OF PRESENT ILLNESS:  Richard [name] is a 58 y.o. 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  Last banding November 15 2023 x 4.  [facility].  Reviewed.  No complaints other than occasional constipation for which I recommended increasing lactulose .  REVIEW OF SYSTEMS:  All non-GI ROS negative except for  Past Medical History:  Diagnosis Date   Abnormal nuclear cardiac imaging test    Acid reflux    Acute pancreatitis 08/14/2018   Alcohol  withdrawal delirium (HCC) 08/20/2016   Alcoholic cardiomyopathy (HCC) 16/07/9603   Alcoholic hepatitis    Alcoholic ketoacidosis 11/13/2017   Ascites 12/13/2019   Aspiration pneumonia (HCC) 08/20/2016   Chest pain of uncertain etiology    Chronic systolic CHF (congestive heart failure) (HCC)    Cirrhosis (HCC)    Colon cancer (HCC)    DCM (dilated cardiomyopathy) (HCC)    Drug abuse (HCC) 01/07/2020   Elevated troponin 06/27/2018   ETOH abuse    Gastropathy 08/14/2018   Heme positive stool 11/13/2017   Hepatic steatosis 08/21/2016   High anion gap metabolic acidosis 11/05/2018   History of colon cancer    HTN (hypertension)    Hypertensive urgency 06/27/2018   Hypoglycemia 06/27/2018   Hypokalemia 01/07/2020   Hypomagnesemia    Hypophosphatemia    Lactic acidosis 08/20/2016   Leukocytosis 01/07/2020   Neuropathy    Pancreatitis 08/2018   Polyp of ascending colon    Prolonged Q-T interval on ECG    Prolonged QT interval    Protein-calorie malnutrition, severe 05/02/2018   PUD (peptic ulcer disease)    SBP (spontaneous bacterial peritonitis) (HCC) 01/07/2020   Sepsis (HCC) 08/20/2016   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   SVT (supraventricular tachycardia) (HCC)    Thrombocytopenia (HCC) 08/21/2016    Past Surgical History:  Procedure Laterality Date   BIOPSY  12/14/2019   Procedure:  BIOPSY;  Surgeon: Sergio Dandy, MD;  Location: [facility] ENDOSCOPY;  Service: Endoscopy;;   BIOPSY  11/19/2022   Procedure: BIOPSY;  Surgeon: Elois Hair, MD;  Location: [facility] ENDOSCOPY;  Service: Gastroenterology;;   catherization  2007   COLONOSCOPY WITH PROPOFOL  N/A 12/14/2019   Procedure: COLONOSCOPY WITH PROPOFOL ;  Surgeon: Sergio Dandy, MD;  Location: [facility] ENDOSCOPY;  Service: Endoscopy;  Laterality: N/A;   ESOPHAGEAL BANDING  11/19/2022   Procedure: ESOPHAGEAL BANDING;  Surgeon: Elois Hair, MD;  Location[facility]COPY;  Service: Gastroenterology;;   ESOPHAGEAL BANDING  11/15/2023   Procedure: ESOPHAGEAL BANDING;  Surgeon: Tobin Forts, MD;  Location: [facility]OSCOPY;  Service: Gastroenterology;;   ESOPHAGOGASTRODUODENOSCOPY (EGD) WITH PROPOFOL  N/A 12/14/2019   Procedure: ESOPHAGOGASTRODUODENOSCOPY (EGD) WITH PROPOFOL ;  Surgeon: Sergio Dandy, MD;  Location: [facility]OS[facility];  Service: Endoscopy;  Laterality: N/A;   ESOPHAGOGASTRODUODENOSCOPY (EGD) WITH PROPOFOL  N/A 11/19/2022   Procedure: ESOPHAGOGASTRODUODENOSCOPY (EGD) WITH PROPOFOL ;  Surgeon: Elois Hair, MD;  Location: [facility] ENDOSCOPY;  Service: Gastroenterology;  Laterality: N/A;   ESOPHAGOGASTRODUODENOSCOPY (EGD) WITH PROPOFOL  N/A 11/15/2023   Procedure: ESOPHAGOGASTRODUODENOSCOPY (EGD) WITH PROPOFOL ;  Surgeon: Tobin Forts, MD;  Location: [facility] ENDOSCOPY;  Service: Gastroenterology;  Laterality: N/A;   HERNIA REPAIR  1969   1 x at birth and at 58 years old   LAPAROSCOPIC SIGMOID COLECTOMY  2007   OPEN REDUCTION INTERNAL FIXATION (ORIF) HAND Right 2012   3rd  digit   POLYPECTOMY  12/14/2019   Procedure: POLYPECTOMY;  Surgeon: Nandigam, Kavitha V, MD;  Location: [facility]OSCOPY;  Service: Endoscopy;;    Social History Richard [name]  reports that he has been smoking cigarettes. He has never used smokeless tobacco. He reports that he does not currently use alcohol  after a past usage of about 2.0 standard drinks of  alcohol  per week. He reports current drug use. Drug: Marijuana.  family history includes Cancer in his sister; Colon cancer in his cousin and father; Hypertension in his mother; Kidney disease in his sister.  Allergies  Allergen Reactions   Aspirin  Other (See Comments)    Caused acid reflux   Penicillins Hives    Has patient had a PCN reaction causing immediate rash, facial/tongue/throat swelling, SOB or lightheadedness with hypotension: yes Has patient had a PCN reaction causing severe rash involving mucus membranes or skin necrosis: no Has patient had a PCN reaction that required hospitalization: no Has patient had a PCN reaction occurring within the last 10 years: no If all of the above answers are "NO", then may proceed with Cephalosporin use.        PHYSICAL EXAMINATION: Vital signs: BP (!) 165/75   Pulse (!) 53   Temp 97.7 F (36.5 C) (Temporal)   Resp 13   Ht 6' (1.829 m)   Wt 63.5 kg   SpO2 99%   BMI 18.99 kg/m  General: Well-developed, well-nourished, no acute distress HEENT: Sclerae are anicteric, conjunctiva pink. Oral mucosa intact Lungs: Clear Heart: Regular Abdomen: soft, nontender, nondistended, no obvious ascites, no peritoneal signs, normal bowel sounds. No organomegaly. Extremities: No edema Psychiatric: alert and oriented x3. Cooperative     ASSESSMENT:  1.  Hepatic cirrhosis with portal hypertension, esophageal varices with prior bleeding and banding.  Now for surveillance EGD with possible repeat banding   PLAN:  1.  EGD with possible banding 2.  Keep [facility]-up with [facility] in November as they recommended  Richard [name]., M.D. [facility] Division of Gastroenterology

## 2024-02-22 NOTE — Anesthesia Preprocedure Evaluation (Signed)
 Anesthesia Evaluation  Patient identified by MRN, date of birth, ID band Patient awake    Reviewed: Allergy & Precautions, NPO status , Patient's Chart, lab work & pertinent test results  Airway Mallampati: II  TM Distance: >3 FB Neck ROM: Full    Dental  (+) Dental Advisory Given   Pulmonary Current Smoker   breath sounds clear to auscultation       Cardiovascular hypertension, Pt. on medications + Past MI and +CHF   Rhythm:Regular Rate:Normal     Neuro/Psych negative neurological ROS     GI/Hepatic PUD,GERD  ,,(+) Cirrhosis   Esophageal Varices    , Hepatitis -  Endo/Other  negative endocrine ROS    Renal/GU negative Renal ROS     Musculoskeletal   Abdominal   Peds  Hematology  (+) Blood dyscrasia, anemia   Anesthesia Other Findings   Reproductive/Obstetrics                             Anesthesia Physical Anesthesia Plan  ASA: 3  Anesthesia Plan: MAC   Post-op Pain Management:    Induction:   PONV Risk Score and Plan: 0 and Propofol  infusion  Airway Management Planned: Natural Airway and Nasal Cannula  Additional Equipment:   Intra-op Plan:   Post-operative Plan:   Informed Consent: I have reviewed the patients History and Physical, chart, labs and discussed the procedure including the risks, benefits and alternatives for the proposed anesthesia with the patient or authorized representative who has indicated his/her understanding and acceptance.       Plan Discussed with: CRNA  Anesthesia Plan Comments:        Anesthesia Quick Evaluation

## 2024-02-22 NOTE — Op Note (Signed)
 [facility] Patient Name: Richard [name] Procedure Date: 02/22/2024 MRN: [id number] Attending MD: Murel Arlington. Elvin Hammer , MD, [id number] Date of Birth: November 06, 1965 CSN: [id number] Age: 58 Admit Type: Inpatient Procedure:                Upper GI endoscopy with band ligation of the                            esophagus x 3 Indications:              Follow-up of esophageal varices, For therapy of                            esophageal varices. Last endoscopy with banding                            February 2025 Providers:                Murel Arlington. Elvin Hammer, MD, Suzann Ernst, RN, Nicki Barnacle, Technician, Phebe Brasil, CRNA Referring MD:             Joaquin Mulberry, MD Medicines:                Monitored Anesthesia Care Complications:            No immediate complications. Estimated Blood Loss:     Estimated blood loss: none. Procedure:                Pre-Anesthesia Assessment:                           - Prior to the procedure, a History and Physical                            was performed, and patient medications and                            allergies were reviewed. The patient's tolerance of                            previous anesthesia was also reviewed. The risks                            and benefits of the procedure and the sedation                            options and risks were discussed with the patient.                            All questions were answered, and informed consent                            was obtained. Prior Anticoagulants: The patient has  taken no anticoagulant or antiplatelet agents. ASA                            Grade Assessment: III - A patient with severe                            systemic disease. After reviewing the risks and                            benefits, the patient was deemed in satisfactory                            condition to undergo the procedure.                            After obtaining informed consent, the endoscope was                            passed under direct vision. Throughout the                            procedure, the patient's blood pressure, pulse, and                            oxygen saturations were monitored continuously. The                            [id number]-H190 ([id number]) Olympus endoscope was introduced                            through the mouth, and advanced to the second part                            of duodenum. The upper GI endoscopy was                            accomplished without difficulty. The patient                            tolerated the procedure well. Scope In: Scope Out: Findings:      The esophagus revealed varices as described below. During the endoscopic       survey the patient retched which caused a Mallory-Weiss tear which had       persistent oozing of blood. See images. This was successfully treated       with band ligation.      Grade II varices were found in the lower third of the esophagus. Three       bands were successfully placed with seemingly complete eradication,       resulting in deflation of varices. See images      The stomach revealed small hiatal hernia and moderate portal       hypertensive gastropathy. No obvious proximal gastric varices.      The examined duodenum was normal.      The cardia and gastric fundus were normal on retroflexion.  Impression:               1. Retching induced Mallory-Weiss tear successfully                            treated with band ligation x 1                           2. Grade 2 varices without stigmata treated with                            band ligation                           3. Small hiatal hernia                           4. Portal hypertensive gastropathy                           5. Otherwise unremarkable EGD Moderate Sedation:      none Recommendation:           1. Patient has a contact number available for                             emergencies. The signs and symptoms of potential                            delayed complications were discussed with the                            patient. Return to normal activities tomorrow.                            Written discharge instructions were provided to the                            patient.                           2. Clear liquid diet for 2 hours then soft diet                            till a.m. Resume regular diet thereafter.                           3. Continue present medications.                           4. [facility] liver care [facility]                           5. Repeat EGD with possible band ligation in 3 to 4                            months  6. Increase lactulose  for constipation Procedure Code(s):        --- Professional ---                           [phone], Esophagogastroduodenoscopy, flexible,                            transoral; with band ligation of esophageal/gastric                            varices Diagnosis Code(s):        --- Professional ---                           I85.00, Esophageal varices without bleeding CPT copyright 2022 American Medical Association. All rights reserved. The codes documented in this report are preliminary and upon coder review may  be revised to meet current compliance requirements. Murel Arlington. Elvin Hammer, MD 02/22/2024 10:06:26 AM This report has been signed electronically. Number of Addenda: 0

## 2024-02-23 ENCOUNTER — Encounter (INDEPENDENT_AMBULATORY_CARE_PROVIDER_SITE_OTHER): Payer: Self-pay | Admitting: Otolaryngology

## 2024-02-26 ENCOUNTER — Encounter (HOSPITAL_COMMUNITY): Payer: Self-pay | Admitting: Internal Medicine

## 2024-02-27 NOTE — Anesthesia Postprocedure Evaluation (Signed)
 Anesthesia Post Note  Patient: Arling Cerone Vanleer  Procedure(s) Performed: EGD (ESOPHAGOGASTRODUODENOSCOPY) ESOPHAGOSCOPY, WITH ESOPHAGEAL VARICES BAND LIGATION     Patient location during evaluation: PACU Anesthesia Type: MAC Level of consciousness: awake and alert Pain management: pain level controlled Vital Signs Assessment: post-procedure vital signs reviewed and stable Respiratory status: spontaneous breathing, nonlabored ventilation, respiratory function stable and patient connected to nasal cannula oxygen Cardiovascular status: stable and blood pressure returned to baseline Postop Assessment: no apparent nausea or vomiting Anesthetic complications: no   No notable events documented.        Kristyne Woodring D Laster [place]

## 2024-04-03 ENCOUNTER — Telehealth: Payer: Self-pay

## 2024-04-03 NOTE — Telephone Encounter (Signed)
 Chk for exam schedule.

## 2024-04-03 NOTE — Telephone Encounter (Signed)
-----   Message from Nurse Nyla C sent at 09/28/2023 12:48 PM EST ----- Patient needs 6 month US . Order already in. Refer back to US  imaging note 09/22/23.

## 2024-04-16 ENCOUNTER — Telehealth: Payer: Self-pay | Admitting: Internal Medicine

## 2024-04-16 NOTE — Telephone Encounter (Signed)
 PT is requesting Dr. Nancyann nurse reach out to his dentist in regard to scheduling a tooth removal to be done prior to a colonoscopy. His recall is not until 2026 but they need to know will any of his medications interfere with the antibiotics he will have to take post removal. Please advise. [facility] [phone]

## 2024-04-16 NOTE — Telephone Encounter (Signed)
 Called dental office back and left message for them to call back.

## 2024-04-16 NOTE — Telephone Encounter (Signed)
 Inbound call from patient requesting a call to discuss scheduling October EGD with banding at the hospital. Please advise, thank you.

## 2024-04-17 ENCOUNTER — Other Ambulatory Visit: Payer: Self-pay

## 2024-04-17 ENCOUNTER — Other Ambulatory Visit (HOSPITAL_COMMUNITY)

## 2024-04-17 DIAGNOSIS — K7030 Alcoholic cirrhosis of liver without ascites: Secondary | ICD-10-CM

## 2024-04-17 NOTE — Telephone Encounter (Signed)
 Order was placed and us  was scheduled and cancelled same day. No one contacted our office, happened to see this when spoke with pt yesterday. He needs US  scheduled., new order placed.

## 2024-04-18 NOTE — Telephone Encounter (Signed)
 Never received a call back from dental office. Will await further communication from their office.

## 2024-04-19 NOTE — Telephone Encounter (Signed)
 Ultrasound scheduled for 04/25/24 at 10 am.

## 2024-04-20 ENCOUNTER — Other Ambulatory Visit: Payer: Self-pay | Admitting: Family Medicine

## 2024-04-20 DIAGNOSIS — S161XXA Strain of muscle, fascia and tendon at neck level, initial encounter: Secondary | ICD-10-CM

## 2024-04-24 ENCOUNTER — Telehealth: Payer: Self-pay | Admitting: Internal Medicine

## 2024-04-24 NOTE — Telephone Encounter (Signed)
 Pt wanting to reschedule his US  appt. Pt provided the phone number for rad scheduling [phone]. He will be able to reschedule the appt.

## 2024-04-24 NOTE — Telephone Encounter (Signed)
 Patient called and stated that he has an appointment for the 24 th of July. Patient is wanting to if he can reschedule this appointment for the 25 th or the week after. Patient is requesting a call back. Please advise.

## 2024-04-25 ENCOUNTER — Ambulatory Visit (HOSPITAL_COMMUNITY)

## 2024-04-26 ENCOUNTER — Institutional Professional Consult (permissible substitution) (INDEPENDENT_AMBULATORY_CARE_PROVIDER_SITE_OTHER): Admitting: Otolaryngology

## 2024-05-02 ENCOUNTER — Ambulatory Visit (HOSPITAL_COMMUNITY)
Admission: RE | Admit: 2024-05-02 | Discharge: 2024-05-02 | Disposition: A | Discharge status: Home / Self Care | Source: Ambulatory Visit | Attending: Internal Medicine | Admitting: Internal Medicine

## 2024-05-02 DIAGNOSIS — K7030 Alcoholic cirrhosis of liver without ascites: Secondary | ICD-10-CM | POA: Diagnosis present

## 2024-05-02 DIAGNOSIS — K7460 Unspecified cirrhosis of liver: Secondary | ICD-10-CM | POA: Diagnosis not present

## 2024-05-02 DIAGNOSIS — K769 Liver disease, unspecified: Secondary | ICD-10-CM | POA: Diagnosis not present

## 2024-05-02 DIAGNOSIS — R188 Other ascites: Secondary | ICD-10-CM | POA: Diagnosis not present

## 2024-05-02 DIAGNOSIS — K8020 Calculus of gallbladder without cholecystitis without obstruction: Secondary | ICD-10-CM | POA: Diagnosis not present

## 2024-05-07 ENCOUNTER — Other Ambulatory Visit: Payer: Self-pay | Admitting: Family Medicine

## 2024-05-08 NOTE — Telephone Encounter (Signed)
 Requested Prescriptions  Pending Prescriptions Disp Refills   propranolol  (INDERAL ) 10 MG tablet [Pharmacy Med Name: PROPRANOLOL  10 MG TABLET] 60 tablet 3    Sig: TAKE 1 TABLET BY MOUTH TWICE A DAY     Cardiovascular:  Beta Blockers Failed - 05/08/2024  1:46 PM      Failed - Last BP in normal range    BP Readings from Last 1 Encounters:  02/22/24 (!) 156/75         Passed - Last Heart Rate in normal range    Pulse Readings from Last 1 Encounters:  02/22/24 (!) 58         Passed - Valid encounter within last 6 months    Recent Outpatient Visits           3 months ago Epistaxis   [facility] [place]. 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 [facility] Delbert Clam, MD   6 months ago Other acute sinusitis, recurrence not specified   [facility] [place]. [facility] Delbert Clam, MD   7 months ago Strain of neck muscle, initial encounter   [facility] [place]. [facility], [place], [place]   11 months ago Alcoholic cirrhosis of liver with ascites [facility])   [facility] [place]. [facility] Delbert Clam, MD       Future Appointments             In 1 month Delbert Clam, MD [facility] [place]. [facility]

## 2024-05-10 ENCOUNTER — Ambulatory Visit: Payer: Self-pay | Admitting: Internal Medicine

## 2024-05-10 ENCOUNTER — Other Ambulatory Visit: Payer: Self-pay | Admitting: Family Medicine

## 2024-05-10 DIAGNOSIS — K7011 Alcoholic hepatitis with ascites: Secondary | ICD-10-CM

## 2024-05-27 ENCOUNTER — Ambulatory Visit: Payer: Self-pay

## 2024-05-27 DIAGNOSIS — K08109 Complete loss of teeth, unspecified cause, unspecified class: Secondary | ICD-10-CM

## 2024-05-27 NOTE — Telephone Encounter (Signed)
 FYI Only or Action Required?: Action required by provider: patient is asking for help in regard to pain control.  Patient was last seen in primary care on 02/08/2024 by Danton Jon HERO, PA-C.  Called Nurse Triage reporting Pain.  Symptoms began several months ago.  Interventions attempted: Prescription medications: Gabapentin  and Rest, hydration, or home remedies.  Symptoms are: unchanged.  Triage Disposition: See HCP Within 4 Hours (Or PCP Triage)-patient wants a call back from office.   Patient/caregiver understands and will follow disposition?: No, wishes to speak with PCP  Copied from CRM #[id number]. Topic: Clinical - Red Word Triage >> May 27, 2024  4:47 PM Tobias L wrote: Red Word that prompted transfer to Nurse Triage: patient in pain, patient's tooth fell out, severe pain, pain worsening Reason for Disposition  [1] SEVERE pain (e.g., excruciating, unable to do any normal activities) AND [2] not improved 2 hours after pain medicine  Answer Assessment - Initial Assessment Questions 1. ONSET: When did the muscle aches or body pains start?      Chronic body pain 2. LOCATION: What part of your body is hurting? (e.g., entire body, arms, legs)      Entire body 3. SEVERITY: How bad is the pain? (Scale 1-10; or mild, moderate, severe)     8 out of 10 4. CAUSE: What do you think is causing the pains?    Unsure of what is causing pain 5. FEVER: Do you have a fever? If Yes, ask: What is your temperature, how was it measured, and  when did it start?      Patient unwilling to answer  6. OTHER SYMPTOMS: Do you have any other symptoms? (e.g., chest pain, cold or flu symptoms, rash, weakness, weight loss)     Patient unwilling to answer 8. TRAVEL: Have you traveled out of the country in the last month? (e.g., exposures, travel history)     Patient unwilling to answer  Patient calling with complaints about his pain control. Patient is asking for follow up call.  Protocols  used: Muscle Aches and Body Pain-A-AH

## 2024-05-29 ENCOUNTER — Other Ambulatory Visit: Payer: Self-pay | Admitting: Family Medicine

## 2024-05-29 MED ORDER — TRAMADOL HCL 50 MG PO TABS: 50.0000 mg | ORAL_TABLET | Freq: Two times a day (BID) | ORAL | 0 refills | Status: DC | PRN

## 2024-05-29 MED ORDER — TRAMADOL HCL 50 MG PO TABS
50.0000 mg | ORAL_TABLET | Freq: Two times a day (BID) | ORAL | 0 refills | Status: AC | PRN
Start: 2024-05-29 — End: 2024-06-03

## 2024-05-29 NOTE — Telephone Encounter (Signed)
 Call to patient to advise that an  urgent referral to the dentist for him.  Prescription for tramadol  sent to his pharmacy. Unable to reach or leave VM

## 2024-05-29 NOTE — Addendum Note (Signed)
 Addended by: Jovaun Levene on: 05/29/2024 01:43 PM   Modules accepted: Orders

## 2024-05-29 NOTE — Telephone Encounter (Signed)
 I have placed an urgent referral to the dentist for him.  Prescription for tramadol  sent to his pharmacy.

## 2024-06-12 ENCOUNTER — Ambulatory Visit: Payer: Self-pay | Admitting: Family Medicine

## 2024-06-17 ENCOUNTER — Ambulatory Visit: Payer: Self-pay

## 2024-06-17 ENCOUNTER — Other Ambulatory Visit: Payer: Self-pay | Admitting: Family Medicine

## 2024-06-17 DIAGNOSIS — S161XXA Strain of muscle, fascia and tendon at neck level, initial encounter: Secondary | ICD-10-CM

## 2024-06-17 DIAGNOSIS — G621 Alcoholic polyneuropathy: Secondary | ICD-10-CM

## 2024-06-17 NOTE — Telephone Encounter (Signed)
 FYI Only or Action Required?: FYI only for provider.  Patient was last seen in primary care on 02/08/2024 by Danton Jon HERO, PA-C.  Called Nurse Triage reporting Pain.  Symptoms began several years ago.  Interventions attempted: Rest, hydration, or home remedies.  Symptoms are: unchanged.  Triage Disposition: See PCP Within 2 Weeks  Patient/caregiver understands and will follow disposition?: Yes Reason for Disposition  Foot pain is a chronic symptom (recurrent or ongoing AND present > 4 weeks)  Answer Assessment - Initial Assessment Questions Right foot broken in 2016. Tired of going through this pain. Patient asking about refill of medication, advised patient that refills can take up to 3 days and that is was sent over to the provider.  1. ONSET: When did the pain start?      Quite some time  2. LOCATION: Where is the pain located?      Both feet, toes  3. PAIN: How bad is the pain?    (Scale 1-10; or mild, moderate, severe)      Severe, can barely walk or put on shoes, feels like its nerve pain.  4. CAUSE: What do you think is causing the foot pain?     Unsure  5. OTHER SYMPTOMS: Do you have any other symptoms? (e.g., leg pain, rash, fever, numbness)     Thinks there may be some swelling in the feet  Protocols used: Foot Pain-A-AH  Copied from CRM [id number]. Topic: Clinical - Red Word Triage >> Jun 17, 2024  3:35 PM Myrick T wrote: Red Word that prompted transfer to Nurse Triage: patient said he is having severe pain in his feet. The pain radiates from his toes up his legs. He said the medication that was helping he is out of but he wants to take only one med and feel better instead of 2 or 3 that will mess up his kidneys.

## 2024-06-17 NOTE — Telephone Encounter (Unsigned)
 Copied from CRM [id number]. Topic: Clinical - Medication Refill >> Jun 17, 2024  3:41 PM Yolanda T wrote: Medication: methocarbamol  (ROBAXIN ) 500 MG tablet  gabapentin  (NEURONTIN ) 300 MG capsule  naproxen  (NAPROSYN ) 500 MG tablet  Has the patient contacted their pharmacy? No  This is the patient's preferred pharmacy:  [facility] [phone] [address], [place] - [address] [place] [address] Phone: [phone] Fax: [phone]  Is this the correct pharmacy for this prescription? Yes If no, delete pharmacy and type the correct one.   Has the prescription been filled recently? Yes  Is the patient out of the medication? Yes  Has the patient been seen for an appointment in the last year OR does the patient have an upcoming appointment? Yes  Can we respond through MyChart? Yes  Agent: Please be advised that Rx refills may take up to 3 business days. We ask that you follow-up with your pharmacy.

## 2024-06-18 MED ORDER — NAPROXEN 500 MG PO TABS: 500.0000 mg | ORAL_TABLET | Freq: Two times a day (BID) | ORAL | 0 refills | Status: DC

## 2024-06-18 MED ORDER — GABAPENTIN 300 MG PO CAPS: 600.0000 mg | ORAL_CAPSULE | Freq: Three times a day (TID) | ORAL | 1 refills | Status: AC

## 2024-06-18 MED ORDER — METHOCARBAMOL 500 MG PO TABS: 1000.0000 mg | ORAL_TABLET | Freq: Three times a day (TID) | ORAL | 0 refills | Status: DC | PRN

## 2024-06-18 NOTE — Telephone Encounter (Signed)
 Call to patient to clarify what medication he requesting . VM left

## 2024-06-24 ENCOUNTER — Other Ambulatory Visit: Payer: Self-pay | Admitting: Family Medicine

## 2024-06-24 DIAGNOSIS — K7011 Alcoholic hepatitis with ascites: Secondary | ICD-10-CM

## 2024-06-24 DIAGNOSIS — G621 Alcoholic polyneuropathy: Secondary | ICD-10-CM

## 2024-06-24 NOTE — Telephone Encounter (Unsigned)
 Copied from CRM [id number]. Topic: Clinical - Medication Refill >> Jun 24, 2024  3:23 PM Winona R wrote: Medication: gabapentin  (NEURONTIN ) 300 MG capsule lactulose  (CHRONULAC ) 10 GM/15ML solution  Has the patient contacted their pharmacy? Yes (Agent: If no, request that the patient contact the pharmacy for the refill. If patient does not wish to contact the pharmacy document the reason why and proceed with request.) (Agent: If yes, when and what did the pharmacy advise?)  This is the patient's preferred pharmacy:  [facility] [phone] [address], [place] - [address] [place] [address] Phone: [phone] Fax: [phone]  Is this the correct pharmacy for this prescription? Yes If no, delete pharmacy and type the correct one.   Has the prescription been filled recently? Yes  Is the patient out of the medication? Yes  Has the patient been seen for an appointment in the last year OR does the patient have an upcoming appointment? Yes  Can we respond through MyChart? Yes  Agent: Please be advised that Rx refills may take up to 3 business days. We ask that you follow-up with your pharmacy.

## 2024-06-25 ENCOUNTER — Ambulatory Visit: Payer: Self-pay | Attending: Internal Medicine | Admitting: Internal Medicine

## 2024-06-25 ENCOUNTER — Other Ambulatory Visit: Payer: Self-pay | Admitting: Family Medicine

## 2024-06-25 VITALS — BP 136/80 | HR 59 | Temp 98.3°F | Ht 72.0 in | Wt 131.0 lb

## 2024-06-25 DIAGNOSIS — K7030 Alcoholic cirrhosis of liver without ascites: Secondary | ICD-10-CM | POA: Diagnosis not present

## 2024-06-25 DIAGNOSIS — R040 Epistaxis: Secondary | ICD-10-CM | POA: Diagnosis not present

## 2024-06-25 DIAGNOSIS — Z9181 History of falling: Secondary | ICD-10-CM

## 2024-06-25 DIAGNOSIS — K7011 Alcoholic hepatitis with ascites: Secondary | ICD-10-CM

## 2024-06-25 DIAGNOSIS — G621 Alcoholic polyneuropathy: Secondary | ICD-10-CM | POA: Diagnosis not present

## 2024-06-25 DIAGNOSIS — Z2821 Immunization not carried out because of patient refusal: Secondary | ICD-10-CM | POA: Diagnosis not present

## 2024-06-25 DIAGNOSIS — R627 Adult failure to thrive: Secondary | ICD-10-CM | POA: Diagnosis not present

## 2024-06-25 DIAGNOSIS — R269 Unspecified abnormalities of gait and mobility: Secondary | ICD-10-CM | POA: Diagnosis not present

## 2024-06-25 MED ORDER — LACTULOSE 10 GM/15ML PO SOLN: 20.0000 g | Freq: Two times a day (BID) | ORAL | 2 refills | Status: AC

## 2024-06-25 NOTE — Telephone Encounter (Signed)
 Requested Prescriptions  Pending Prescriptions Disp Refills   gabapentin  (NEURONTIN ) 300 MG capsule 180 capsule 1    Sig: Take 2 capsules (600 mg total) by mouth 3 (three) times daily.     Neurology: Anticonvulsants - gabapentin  Passed - 06/25/2024  2:49 PM      Passed - Cr in normal range and within 360 days    Creat  Date Value Ref Range Status  11/06/2020 0.60 (L) 0.70 - 1.33 mg/dL Final    Comment:    For patients >56 years of age, the reference limit for Creatinine is approximately 13% higher for people identified as African-American. .    Creatinine, Ser  Date Value Ref Range Status  01/01/2024 0.92 0.61 - 1.24 mg/dL Final         Passed - Completed PHQ-2 or PHQ-9 in the last 360 days      Passed - Valid encounter within last 12 months    Recent Outpatient Visits           4 months ago Epistaxis   [facility] [place]. [facility] [place], [place], [place]   6 months ago Alcoholic peripheral neuropathy   [facility] [place]. [facility] Delbert Clam, [place]   8 months ago Other acute sinusitis, recurrence not specified   [facility] [place]. [facility] Delbert Clam, MD   9 months ago Strain of neck muscle, initial encounter   [facility] [place] [place]. [facility] [place], [place], [place]   1 year ago Alcoholic cirrhosis of liver with ascites [facility])   [facility] [place]. [facility] Delbert, Clam, MD               lactulose  (CHRONULAC ) 10 GM/15ML solution 1892 mL 2    Sig: Take 30 mLs (20 g total) by mouth 2 (two) times daily.     Gastroenterology:  Laxatives - lactulose  Failed - 06/25/2024  2:49 PM      Failed - CO2 in normal range and within 360 days    CO2  Date Value Ref Range Status  01/01/2024 19 (L) 22 - 32 mmol/L Final   Bicarbonate  Date  Value Ref Range Status  08/14/2018 17.5 (L) 20.0 - 28.0 mmol/L Final         Failed - K in normal range and within 360 days    Potassium  Date Value Ref Range Status  01/01/2024 3.4 (L) 3.5 - 5.1 mmol/L Final         Passed - Cl in normal range and within 360 days    Chloride  Date Value Ref Range Status  01/01/2024 111 98 - 111 mmol/L Final         Passed - Na in normal range and within 360 days    Sodium  Date Value Ref Range Status  01/01/2024 138 135 - 145 mmol/L Final  05/10/2023 139 134 - 144 mmol/L Final         Passed - Valid encounter within last 12 months    Recent Outpatient Visits           4 months ago Epistaxis   [facility] [place]. [facility] [place], [place], [place]  6 months ago Alcoholic peripheral neuropathy   [facility] [place]. [facility] Delbert Clam, MD   8 months ago Other acute sinusitis, recurrence not specified   [facility] [place]. [facility] Delbert Clam, MD   9 months ago Strain of neck muscle, initial encounter   [facility] - [facility] [place]. [facility] [place], [place], [place]   1 year ago Alcoholic cirrhosis of liver with ascites [facility])   [facility] - [facility] [place]. [facility] Delbert Clam, MD
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
## 2024-06-25 NOTE — Patient Instructions (Signed)
  VISIT SUMMARY: Today, we discussed your worsening foot pain, recurrent nosebleeds, and decreased appetite. We reviewed your current medications and daily challenges, and we have made several adjustments to help manage your symptoms and improve your quality of life.  YOUR PLAN: -PERIPHERAL NEUROPATHY OF LOWER EXTREMITIES: Peripheral neuropathy is a condition that causes pain, numbness, and weakness in the extremities due to nerve damage. You will continue taking gabapentin  600 mg three times daily. We will also provide a shower chair and shower rails to help with bathing, and arrange for a home health aide to assist with daily activities. Additionally, we will check your vitamin B12 level.  -CIRRHOSIS OF LIVER WITH COAGULOPATHY: Cirrhosis is severe liver damage that can lead to increased bleeding risk. You have been experiencing frequent nosebleeds. We will refer you to an ENT specialist for further evaluation. In the meantime, if you have a nosebleed, pinch your nose and hold your head back for 5 minutes. If the bleeding is severe or does not stop, seek emergency care. Avoid picking your nose to prevent further bleeding.  -UNINTENTIONAL WEIGHT LOSS AND DECREASED ORAL INTAKE: You have been eating less and losing weight since your esophageal varices banding procedure. We will arrange for a meals on wheels service or a similar program to help ensure you get enough nutrition. Try to increase your meal frequency to at least two full meals per day.  INSTRUCTIONS: Please follow up with the ENT specialist as soon as possible for your recurrent nosebleeds. Continue taking your gabapentin  as prescribed and use the shower chair and rails for safety. Expect a home health aide to assist you with daily activities soon. We will also check your vitamin B12 level. If you experience severe or persistent nosebleeds, seek emergency care immediately.                      Contains text generated by  Abridge.                                 Contains text generated by Abridge.

## 2024-06-26 ENCOUNTER — Ambulatory Visit: Payer: Self-pay | Admitting: Internal Medicine

## 2024-06-26 ENCOUNTER — Telehealth: Payer: Self-pay

## 2024-06-26 ENCOUNTER — Encounter: Payer: Self-pay | Admitting: Internal Medicine

## 2024-06-26 DIAGNOSIS — R269 Unspecified abnormalities of gait and mobility: Secondary | ICD-10-CM

## 2024-06-26 LAB — CBC
Hematocrit: 46.7 % (ref 37.5–51.0)
Hemoglobin: 15.8 g/dL (ref 13.0–17.7)
MCH: 31.2 pg (ref 26.6–33.0)
MCHC: 33.8 g/dL (ref 31.5–35.7)
MCV: 92 fL (ref 79–97)
Platelets: 109 x10E3/uL — ABNORMAL LOW (ref 150–450)
RBC: 5.06 x10E6/uL (ref 4.14–5.80)
RDW: 13.5 % (ref 11.6–15.4)
WBC: 4.4 x10E3/uL (ref 3.4–10.8)

## 2024-06-26 LAB — COMPREHENSIVE METABOLIC PANEL WITH GFR
ALT: 18 IU/L (ref 0–44)
AST: 29 IU/L (ref 0–40)
Albumin: 4.2 g/dL (ref 3.8–4.9)
Alkaline Phosphatase: 142 IU/L — ABNORMAL HIGH (ref 47–123)
BUN/Creatinine Ratio: 9 (ref 9–20)
BUN: 8 mg/dL (ref 6–24)
Bilirubin Total: 1.7 mg/dL — ABNORMAL HIGH (ref 0.0–1.2)
CO2: 18 mmol/L — ABNORMAL LOW (ref 20–29)
Calcium: 9.6 mg/dL (ref 8.7–10.2)
Chloride: 105 mmol/L (ref 96–106)
Creatinine, Ser: 0.91 mg/dL (ref 0.76–1.27)
Globulin, Total: 3.3 g/dL (ref 1.5–4.5)
Glucose: 82 mg/dL (ref 70–99)
Potassium: 3.7 mmol/L (ref 3.5–5.2)
Sodium: 141 mmol/L (ref 134–144)
Total Protein: 7.5 g/dL (ref 6.0–8.5)
eGFR: 99 mL/min/1.73 (ref >59)

## 2024-06-26 LAB — VITAMIN B12: Vitamin B-12: 498 pg/mL (ref 232–1245)

## 2024-06-26 NOTE — Telephone Encounter (Signed)
 Referral received for home health PT.  I called the patient and inquired if he has a preference for home health agencies and he did not have a preference.  I explained to him that I will contact multiple agencies in the area but there is no guarantee that any of them will be able to accept the referral because they need to be in network with his insurance and have the appropriate staffing. I also updated his address in Epic.  We then discussed PCS and he was also agreeable to placing that referral.    In addition, he is interested in home delivered meals. He is not 58 yo to qualify for MOW and there is still a long waiting list for those who qualify.  I told him that I will need to check other resource in the community, possibly [facility] and he said he understood.     Messages sent to the following home health agencies requesting they review the referral for acceptance:  [facility] Peele/ [facility] Angie Coley/ [place] Jeraline Sharper Burns/ [facility] Kiki Robinson/[facility] Kasie Watkins/ [facility]

## 2024-06-27 NOTE — Telephone Encounter (Signed)
 Signed PCS request efaxed to [facility]

## 2024-06-27 NOTE — Telephone Encounter (Signed)
 I spoke to the following agencies:  Jenna/ [facility]- declined referral  Brianna/ [facility] - requested referral be faxed for review: [phone]  Liz/ [facility]: also requested fax for review:[phone]  Tinielle/ [facility]: requested fax for review: [phone]    She stated it would be a 48 hour turnaround for a decision   Referrals then faxed as requested.

## 2024-06-27 NOTE — Telephone Encounter (Addendum)
 Messages received from the following agencies declining the referral:  [facility]  [facility]

## 2024-07-01 NOTE — Telephone Encounter (Signed)
 Per Tinelle/ [facility]  they have declined the referral.   I spoke to Demetria/ [facility] and she said they did not receive the fax.  I then re-faxed to [phone].  I also spoke to Brianna/ [facility] and informed her that the faxes I sent last week did not go through. She confirmed I had the correct fax number and then suggested I re-fax to [phone].   I then re-faxed the referral for review.

## 2024-07-01 NOTE — Telephone Encounter (Signed)
 Message received from Kiki Essex stating he is no longer at [facility].  I then called [facility] and spoke to Reba who stated they are in network with [facility] [employer] and she asked I fax the referral to [phone].   Referral then faxed to [facility] as requested.

## 2024-07-02 ENCOUNTER — Telehealth: Payer: Self-pay | Admitting: Family Medicine

## 2024-07-02 NOTE — Telephone Encounter (Signed)
 Copied from CRM [id number]. Topic: Clinical - Home Health Verbal Orders >> Jul 02, 2024  9:13 AM Leonette SQUIBB wrote: Caller/Agency: Donny with [facility] Number: [phone]  Service Requested:   She called to say their company is not in network with patients insurance

## 2024-07-02 NOTE — Telephone Encounter (Signed)
 Noted

## 2024-07-02 NOTE — Telephone Encounter (Signed)
 Message received from [facility], Amelia sating they are not able to accept the referral.  I also spoke to Lorenzo and she confirmed that.  I spoke to [facility] and she stated they are not able to accept the referral due to staffing.   I spoke to [facility] and she said they declined the referral.   I called the patient to inform him that I was not able to find a home health agency to accept the referral  10 agencies declined   The phone just rang with no option for voicemail

## 2024-07-03 NOTE — Addendum Note (Signed)
 Addended by: Shunna Mikaelian on: 07/03/2024 05:17 PM   Modules accepted: Orders

## 2024-07-03 NOTE — Telephone Encounter (Addendum)
 Dr Vicci had placed an order for home health PT.  I spoke to the patient and informed him that I contacted 10 home health agencies and none of them accepted the referral, so home health PT is not an option at this time.  He then said he would like to go back to outpatient PT on [facility] where he went in the past. He stated that transportation will not be a problem.  He then told me that  someone is coming out to his house tomorrow, 07/04/2024 to evaluate him. He was not sure exactly who is coming but he thinks it may be regarding the [facility] referral.

## 2024-07-03 NOTE — Telephone Encounter (Signed)
Referral for outpatient PT has been placed

## 2024-07-04 NOTE — Telephone Encounter (Signed)
 I tried to reach the patient to inform him that Dr Delbert has placed a referral for outpatient PT; but the phone just rang, no option to leave a message.

## 2024-07-04 NOTE — Telephone Encounter (Signed)
 The patient called in returning a call to [facility] and I transferred the patient to [facility] for further assistance per her request.

## 2024-07-04 NOTE — Telephone Encounter (Signed)
 I spoke to the patient and informed him that Dr Delbert placed the referral for outpatient PT and he should be expecting a call from [facility] to schedule an appointment.  He also told me that his PCS assessment was done today and the person who was there told him that they would be back in contact with him.  I explained to him again that this services is for an aide to assist him with bathing, dressing, ambulation and he said he really needs that.  I also explained to him that they will let him know how many hours/day and days/ week he will be receiving services.  He did not have any questions at this time

## 2024-07-08 ENCOUNTER — Telehealth: Payer: Self-pay | Admitting: Internal Medicine

## 2024-07-08 NOTE — Telephone Encounter (Signed)
 PT stated that he had some lab work done and someone should have contacted us  about it. Stated that the doctor told him he needs to quit drinking and see GI. He would like to discuss this information.

## 2024-07-09 NOTE — Telephone Encounter (Signed)
 Patient calls mentioning that his PCP told him that there was something wrong inside his stomach that he needed to follow with GI about and told him he needed to stop drinking. Patient states he has not drank in 3 years. Patient is extremely difficult to understand or follow and I am not entirely sure what his concern is.  In talking to patient he discusses that he is bloated, feels his stomach is swollen and has been having shortness of breath. He also states that he is constipated, having a bowel movement this morning but not satisfying. Does have dark/black stool at times but is also on iron  supplementation. Patient notes that he has generalized abdominal pain which varies in intensity and has loss of appetite. He denies any lower extremity swelling. Denies any increased confusion. He does say that his nose bleeds constantly Denies any nausea/vomiting or hematemesis.   Patient says he only takes his lactulose  once per day but will take it twice per day if he eats a lot. We discussed that he should be taking lactulose  twice daily regardless of his food intake. Discussed that lactulose  helps rid his body of built up ammonia/toxins that the liver is no longer able to filter and as an added bonus, helps constipation. Patient says he cannot do twice daily because he will just be in the bathroom all day.   Patient states that he does take his furosemide  every day as prescribed; unable to decipher if he is also taking spironolactone .   Patient is scheduled to see Dr Abran in follow up on 07/23/24.  He is advised that if he has severe abdominal swelling/tightness along with shortness of breath, severe abdominal pain or bleeding, fever, he should report to the emergency room for urgent evaluation. He verbalizes understanding.

## 2024-07-11 ENCOUNTER — Telehealth: Payer: Self-pay | Admitting: Family Medicine

## 2024-07-11 ENCOUNTER — Other Ambulatory Visit: Payer: Self-pay

## 2024-07-11 MED ORDER — MISC. DEVICES MISC: 0 refills | Status: AC

## 2024-07-11 NOTE — Telephone Encounter (Signed)
 Copied from CRM [id number]. Topic: Clinical - Medication Question >> Jul 10, 2024  3:24 PM Emylou G wrote: Reason for CRM: Amy [facility] ( nurse case manager for the ins ) She needs DME equipment put in for patient: shower chair, urinal, over the toilet comode.. Please use an in network for DME.SABRA Lanius can use [facility]: [phone] is their fax If you need Amy her number: [phone]

## 2024-07-11 NOTE — Telephone Encounter (Signed)
 Orders has been printed and will be signed and faxed to [facility] care [facility] once PCP returns to office.

## 2024-07-16 ENCOUNTER — Ambulatory Visit: Admitting: Family Medicine

## 2024-07-16 DIAGNOSIS — G621 Alcoholic polyneuropathy: Secondary | ICD-10-CM | POA: Diagnosis not present

## 2024-07-17 DIAGNOSIS — G621 Alcoholic polyneuropathy: Secondary | ICD-10-CM | POA: Diagnosis not present

## 2024-07-19 DIAGNOSIS — G621 Alcoholic polyneuropathy: Secondary | ICD-10-CM | POA: Diagnosis not present

## 2024-07-22 DIAGNOSIS — G621 Alcoholic polyneuropathy: Secondary | ICD-10-CM | POA: Diagnosis not present

## 2024-07-23 ENCOUNTER — Ambulatory Visit: Admitting: Internal Medicine

## 2024-07-23 DIAGNOSIS — G621 Alcoholic polyneuropathy: Secondary | ICD-10-CM | POA: Diagnosis not present

## 2024-07-24 ENCOUNTER — Encounter: Payer: Self-pay | Admitting: Physical Medicine & Rehabilitation

## 2024-07-24 DIAGNOSIS — G621 Alcoholic polyneuropathy: Secondary | ICD-10-CM | POA: Diagnosis not present

## 2024-07-25 ENCOUNTER — Telehealth: Payer: Self-pay | Admitting: Family Medicine

## 2024-07-25 DIAGNOSIS — G621 Alcoholic polyneuropathy: Secondary | ICD-10-CM | POA: Diagnosis not present

## 2024-07-25 NOTE — Telephone Encounter (Signed)
 Copied from CRM [id number]. Topic: Clinical - Order For Equipment >> Jul 24, 2024  4:56 PM Delon DASEN wrote:  Reason for CRM: Patient needs a prescription for a shower chair, urinal, over the toilet commode. He is getting home health services now. Please call Taka with [facility] [phone]

## 2024-07-25 NOTE — Telephone Encounter (Signed)
 Patient is needing prescription for incontinence supplies.

## 2024-07-25 NOTE — Telephone Encounter (Signed)
 Copied from CRM [id number]. Topic: Clinical - Order For Equipment >> Jul 24, 2024  4:56 PM Delon DASEN wrote:  Reason for CRM: Patient needs a prescription for a shower chair, urinal, over the toilet commode. He is getting home health services now. Please call Taka with [facility] [phone]  >> Jul 25, 2024  9:20 AM Logan FALCON wrote:  Corean from [facility] says rx was sent for a urinal however pt is also requesting more supplies. pharmacy is calling to see if office recieved request. Pt is requesting pull ons and underpads with his urinal. Please advise   [phone]

## 2024-07-26 ENCOUNTER — Encounter (INDEPENDENT_AMBULATORY_CARE_PROVIDER_SITE_OTHER): Payer: Self-pay

## 2024-07-26 ENCOUNTER — Other Ambulatory Visit: Payer: Self-pay

## 2024-07-26 DIAGNOSIS — Z7409 Other reduced mobility: Secondary | ICD-10-CM | POA: Diagnosis not present

## 2024-07-26 DIAGNOSIS — R3981 Functional urinary incontinence: Secondary | ICD-10-CM | POA: Diagnosis not present

## 2024-07-26 DIAGNOSIS — G621 Alcoholic polyneuropathy: Secondary | ICD-10-CM | POA: Diagnosis not present

## 2024-07-26 MED ORDER — MISC. DEVICES MISC: 11 refills | Status: DC

## 2024-07-26 MED ORDER — MISC. DEVICES MISC: 11 refills | Status: AC

## 2024-07-26 NOTE — Addendum Note (Signed)
 Addended by: Peggy Loge on: 07/26/2024 12:54 PM   Modules accepted: Orders

## 2024-07-26 NOTE — Telephone Encounter (Signed)
 Prescription for incontinence supplies has been done.  I also see that he already has prescriptions for shower chair, commode.

## 2024-07-26 NOTE — Telephone Encounter (Signed)
 Script has been faxed to home care delivered.

## 2024-07-29 ENCOUNTER — Ambulatory Visit: Attending: Family Medicine
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
## 2024-07-30 DIAGNOSIS — G621 Alcoholic polyneuropathy: Secondary | ICD-10-CM | POA: Diagnosis not present

## 2024-07-31 DIAGNOSIS — G621 Alcoholic polyneuropathy: Secondary | ICD-10-CM | POA: Diagnosis not present

## 2024-08-01 DIAGNOSIS — G621 Alcoholic polyneuropathy: Secondary | ICD-10-CM | POA: Diagnosis not present

## 2024-08-02 DIAGNOSIS — G621 Alcoholic polyneuropathy: Secondary | ICD-10-CM | POA: Diagnosis not present

## 2024-08-05 ENCOUNTER — Ambulatory Visit: Admitting: Family Medicine

## 2024-08-05 DIAGNOSIS — G621 Alcoholic polyneuropathy: Secondary | ICD-10-CM | POA: Diagnosis not present

## 2024-08-06 DIAGNOSIS — G621 Alcoholic polyneuropathy: Secondary | ICD-10-CM | POA: Diagnosis not present

## 2024-08-07 DIAGNOSIS — G621 Alcoholic polyneuropathy: Secondary | ICD-10-CM | POA: Diagnosis not present

## 2024-08-08 DIAGNOSIS — G621 Alcoholic polyneuropathy: Secondary | ICD-10-CM | POA: Diagnosis not present

## 2024-08-09 DIAGNOSIS — G621 Alcoholic polyneuropathy: Secondary | ICD-10-CM | POA: Diagnosis not present

## 2024-08-12 DIAGNOSIS — G621 Alcoholic polyneuropathy: Secondary | ICD-10-CM | POA: Diagnosis not present

## 2024-08-13 DIAGNOSIS — G621 Alcoholic polyneuropathy: Secondary | ICD-10-CM | POA: Diagnosis not present

## 2024-08-15 DIAGNOSIS — G621 Alcoholic polyneuropathy: Secondary | ICD-10-CM | POA: Diagnosis not present

## 2024-08-16 DIAGNOSIS — G621 Alcoholic polyneuropathy: Secondary | ICD-10-CM | POA: Diagnosis not present

## 2024-08-19 ENCOUNTER — Other Ambulatory Visit: Payer: Self-pay | Admitting: Family Medicine

## 2024-08-19 DIAGNOSIS — G621 Alcoholic polyneuropathy: Secondary | ICD-10-CM | POA: Diagnosis not present

## 2024-08-20 DIAGNOSIS — G621 Alcoholic polyneuropathy: Secondary | ICD-10-CM | POA: Diagnosis not present

## 2024-08-21 ENCOUNTER — Institutional Professional Consult (permissible substitution) (INDEPENDENT_AMBULATORY_CARE_PROVIDER_SITE_OTHER)

## 2024-08-21 DIAGNOSIS — G621 Alcoholic polyneuropathy: Secondary | ICD-10-CM | POA: Diagnosis not present

## 2024-08-22 ENCOUNTER — Encounter: Attending: Physical Medicine & Rehabilitation | Admitting: Physical Medicine & Rehabilitation

## 2024-08-22 DIAGNOSIS — G621 Alcoholic polyneuropathy: Secondary | ICD-10-CM | POA: Diagnosis not present

## 2024-08-23 DIAGNOSIS — G621 Alcoholic polyneuropathy: Secondary | ICD-10-CM | POA: Diagnosis not present

## 2024-08-25 DIAGNOSIS — Z7409 Other reduced mobility: Secondary | ICD-10-CM | POA: Diagnosis not present

## 2024-08-25 DIAGNOSIS — R3981 Functional urinary incontinence: Secondary | ICD-10-CM | POA: Diagnosis not present

## 2024-08-26 DIAGNOSIS — G621 Alcoholic polyneuropathy: Secondary | ICD-10-CM | POA: Diagnosis not present

## 2024-08-27 DIAGNOSIS — G621 Alcoholic polyneuropathy: Secondary | ICD-10-CM | POA: Diagnosis not present

## 2024-08-30 DIAGNOSIS — G621 Alcoholic polyneuropathy: Secondary | ICD-10-CM | POA: Diagnosis not present

## 2024-09-02 DIAGNOSIS — G621 Alcoholic polyneuropathy: Secondary | ICD-10-CM | POA: Diagnosis not present

## 2024-09-04 DIAGNOSIS — G621 Alcoholic polyneuropathy: Secondary | ICD-10-CM | POA: Diagnosis not present

## 2024-09-05 ENCOUNTER — Institutional Professional Consult (permissible substitution) (INDEPENDENT_AMBULATORY_CARE_PROVIDER_SITE_OTHER)

## 2024-09-05 ENCOUNTER — Telehealth: Payer: Self-pay

## 2024-09-05 DIAGNOSIS — G621 Alcoholic polyneuropathy: Secondary | ICD-10-CM | POA: Diagnosis not present

## 2024-09-05 NOTE — Telephone Encounter (Signed)
 Would patient qualify for more hours?  He states that his feet are really bad and its hard for him to move around.

## 2024-09-05 NOTE — Telephone Encounter (Signed)
 I called the patient and he said he receives PCS 3 hours/ day x 5 days/week and he feels he needs more hours.  He said he is having difficulty caring for himself but did not specify what help he needs.  He said that PCS started at the end of October 2025. I explained to him that to qualify for an increase in PCS hours, an individual would need to have a change in their medical/ behavioral health status. I also explained that he already receives 60  hours/ month.  He said that is not enough and he should be able to get more hours. I told him that he will need to speak to Dr Delbert about that because she would need to document the change in his status that supports the need for additional hours.  I told him that even if a request for an increase in hours is submitted there is no guarantee that it will be approved because he already has 60 hours/ month and assessment that his insurance company did was only about 6 weeks ago. He said okay and he wanted to schedule an appointment with Dr Newlin, so I scheduled him for 10/09/2024.

## 2024-09-05 NOTE — Telephone Encounter (Signed)
 Copied from CRM #[id number]. Topic: Clinical - Medical Advice >> Sep 05, 2024  1:57 PM Viola F wrote: Reason for CRM: Patient would prefer more then 3 hours a day for a nurse aid with Home Health and needs Dr. Newlin to approve. Please call him at [phone] (M)

## 2024-09-06 DIAGNOSIS — G621 Alcoholic polyneuropathy: Secondary | ICD-10-CM | POA: Diagnosis not present

## 2024-09-09 DIAGNOSIS — G621 Alcoholic polyneuropathy: Secondary | ICD-10-CM | POA: Diagnosis not present

## 2024-09-10 ENCOUNTER — Other Ambulatory Visit: Payer: Self-pay | Admitting: Family Medicine

## 2024-09-10 DIAGNOSIS — S161XXA Strain of muscle, fascia and tendon at neck level, initial encounter: Secondary | ICD-10-CM

## 2024-09-10 DIAGNOSIS — G621 Alcoholic polyneuropathy: Secondary | ICD-10-CM | POA: Diagnosis not present

## 2024-09-11 DIAGNOSIS — G621 Alcoholic polyneuropathy: Secondary | ICD-10-CM | POA: Diagnosis not present

## 2024-09-24 ENCOUNTER — Other Ambulatory Visit: Payer: Self-pay | Admitting: Family Medicine

## 2024-09-24 DIAGNOSIS — K7031 Alcoholic cirrhosis of liver with ascites: Secondary | ICD-10-CM

## 2024-10-02 ENCOUNTER — Institutional Professional Consult (permissible substitution) (INDEPENDENT_AMBULATORY_CARE_PROVIDER_SITE_OTHER)

## 2024-10-09 ENCOUNTER — Ambulatory Visit: Admitting: Family Medicine

## 2024-10-09 ENCOUNTER — Ambulatory Visit: Payer: Self-pay
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
## 2024-10-10 NOTE — Telephone Encounter (Signed)
Noted patient has upcoming appointment to discuss.

## 2024-10-16 ENCOUNTER — Telehealth: Payer: Self-pay | Admitting: Family Medicine

## 2024-10-16 NOTE — Telephone Encounter (Signed)
 Contacted pt to confirmed appt (per vr)

## 2024-10-17 ENCOUNTER — Telehealth: Payer: Self-pay | Admitting: *Deleted

## 2024-10-17 DIAGNOSIS — K7030 Alcoholic cirrhosis of liver without ascites: Secondary | ICD-10-CM
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
## 2024-10-17 NOTE — Telephone Encounter (Signed)
-----   Message from Nurse Naomie RAMAN, RN sent at 05/10/2024 11:11 AM EDT ----- Needs Repeat US  in 6 months HCC screening, cirrhosis (11/10/24); perry pt

## 2024-10-18 ENCOUNTER — Encounter: Payer: Self-pay | Admitting: Nurse Practitioner

## 2024-10-18 ENCOUNTER — Ambulatory Visit: Payer: Self-pay | Attending: Nurse Practitioner | Admitting: Nurse Practitioner

## 2024-10-18 ENCOUNTER — Other Ambulatory Visit: Payer: Self-pay | Admitting: Family Medicine

## 2024-10-18 VITALS — BP 125/76 | HR 76 | Ht 72.0 in | Wt 131.6 lb

## 2024-10-18 DIAGNOSIS — G8929 Other chronic pain: Secondary | ICD-10-CM

## 2024-10-18 MED ORDER — DULOXETINE HCL 30 MG PO CPEP: 30.0000 mg | ORAL_CAPSULE | Freq: Every day | ORAL | 0 refills | Status: AC

## 2024-10-18 MED ORDER — LIDOCAINE 5 % EX PTCH: 1.0000 | MEDICATED_PATCH | CUTANEOUS | 0 refills | Status: AC

## 2024-10-18 NOTE — Progress Notes (Unsigned)
 "  Assessment & Plan:  Richard [name] was seen today for pain management.  Diagnoses and all orders for this visit:  Chronic bilateral low back pain with bilateral sciatica -     DULoxetine  (CYMBALTA ) 30 MG capsule; Take 1 capsule (30 mg total) by mouth daily. FOR BACK PAIN -     Ambulatory referral to Pain Clinic -     lidocaine  (LIDODERM ) 5 %; Place 1 patch onto the skin daily. Remove & Discard patch within 12 hours or as directed by MD -     Ambulatory referral to Orthopedic Surgery  Chronic abdominal pain Follow up with GI   Patient has been counseled on age-appropriate routine health concerns for screening and prevention. These are reviewed and up-to-date. Referrals have been placed accordingly. Immunizations are up-to-date or declined.    Subjective:   Chief Complaint  Patient presents with   Pain Management    Patient has been experiencing pain in his back , shoulders and abdomen. Would like a referral to pain management.    Richard [name] 59 y.o. male presents to office today for abdominal pain and back pain.  He is a patent of Dr. Newlin  Patient with history of alcoholic cirrhosis, esophageal varices, peripheral neuropathy associated with EtOH, Tobacco dependence, prolonged QT, HTN     was previ a patient of [facility] . Not sure why not seeing any longer.  No showd with GI several time No longer seien ortho   HPI  ROS  Past Medical History:  Diagnosis Date   Abnormal nuclear cardiac imaging test    Acid reflux    Acute pancreatitis 08/14/2018   Alcohol  withdrawal delirium (HCC) 08/20/2016   Alcoholic cardiomyopathy (HCC) 95/93/7978   Alcoholic hepatitis (HCC)    Alcoholic ketoacidosis 11/13/2017   Ascites 12/13/2019   Aspiration pneumonia (HCC) 08/20/2016   Chest pain of uncertain etiology    Chronic systolic CHF (congestive heart failure) (HCC)    Cirrhosis (HCC)    Colon cancer (HCC)    DCM (dilated cardiomyopathy) (HCC)    Drug abuse (HCC) 01/07/2020    Elevated troponin 06/27/2018   ETOH abuse    Gastropathy 08/14/2018   Heme positive stool 11/13/2017   Hepatic steatosis 08/21/2016   High anion gap metabolic acidosis 11/05/2018   History of colon cancer    HTN (hypertension)    Hypertensive urgency 06/27/2018   Hypoglycemia 06/27/2018   Hypokalemia 01/07/2020   Hypomagnesemia    Hypophosphatemia    Lactic acidosis 08/20/2016   Leukocytosis 01/07/2020   Neuropathy    Pancreatitis 08/2018   Polyp of ascending colon    Prolonged Q-T interval on ECG    Prolonged QT interval    Protein-calorie malnutrition, severe 05/02/2018   PUD (peptic ulcer disease)    SBP (spontaneous bacterial peritonitis) (HCC) 01/07/2020   Sepsis (HCC) 08/20/2016   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   SVT (supraventricular tachycardia)    Thrombocytopenia 08/21/2016    Past Surgical History:  Procedure Laterality Date   BIOPSY  12/14/2019   Procedure: BIOPSY;  Surgeon: Shila Gustav GAILS, MD;  Location: [facility];  Service: Endoscopy;;   BIOPSY  11/19/2022   Procedure: BIOPSY;  Surgeon: Stacia Glendia BRAVO, MD;  Location: [facility] ENDOSCOPY;  Service: Gastroenterology;;   catherization  2007   COLONOSCOPY WITH PROPOFOL  N/A 12/14/2019   Procedure: COLONOSCOPY WITH PROPOFOL ;  Surgeon: Shila Gustav GAILS, MD;  Location: [facility];  Service: Endoscopy;  Laterality: N/A;   ESOPHAGEAL BANDING  11/19/2022  Procedure: ESOPHAGEAL BANDING;  Surgeon: Stacia Glendia BRAVO, MD;  Location: [facility] ENDOSCOPY;  Service: Gastroenterology;;   ESOPHAGEAL BANDING  11/15/2023   Procedure: ESOPHAGEAL BANDING;  Surgeon: Abran Norleen SAILOR, MD;  Location: [facility];  Service: Gastroenterology;;   ESOPHAGEAL BANDING N/A 02/22/2024   Procedure: ESOPHAGOSCOPY, WITH ESOPHAGEAL VARICES BAND LIGATION;  Surgeon: Abran Norleen SAILOR, MD;  Location: [facility];  Service: Gastroenterology;  Laterality: N/A;   ESOPHAGOGASTRODUODENOSCOPY N/A 02/22/2024   Procedure: EGD (ESOPHAGOGASTRODUODENOSCOPY);   Surgeon: Abran Norleen SAILOR, MD;  Location: [facility];  Service: Gastroenterology;  Laterality: N/A;   ESOPHAGOGASTRODUODENOSCOPY (EGD) WITH PROPOFOL  N/A 12/14/2019   Procedure: ESOPHAGOGASTRODUODENOSCOPY (EGD) WITH PROPOFOL ;  Surgeon: Shila Gustav GAILS, MD;  Location: [facility];  Service: Endoscopy;  Laterality: N/A;   ESOPHAGOGASTRODUODENOSCOPY (EGD) WITH PROPOFOL  N/A 11/19/2022   Procedure: ESOPHAGOGASTRODUODENOSCOPY (EGD) WITH PROPOFOL ;  Surgeon: Stacia Glendia BRAVO, MD;  Location: [facility] ENDOSCOPY;  Service: Gastroenterology;  Laterality: N/A;   ESOPHAGOGASTRODUODENOSCOPY (EGD) WITH PROPOFOL  N/A 11/15/2023   Procedure: ESOPHAGOGASTRODUODENOSCOPY (EGD) WITH PROPOFOL ;  Surgeon: Abran Norleen SAILOR, MD;  Location: [facility];  Service: Gastroenterology;  Laterality: N/A;   HERNIA REPAIR  1967   1 x at birth and at 59 years old   LAPAROSCOPIC SIGMOID COLECTOMY  2007   OPEN REDUCTION INTERNAL FIXATION (ORIF) HAND Right 2012   3rd  digit   POLYPECTOMY  12/14/2019   Procedure: POLYPECTOMY;  Surgeon: Shila Gustav GAILS, MD;  Location: [facility];  Service: Endoscopy;;    Family History  Problem Relation Age of Onset   Hypertension Mother    Colon cancer Father    Cancer Sister        type unknown   Kidney disease Sister        dialysis   Colon cancer Cousin        x 2   CAD Neg Hx    Stroke Neg Hx    Diabetes Neg Hx    Stomach cancer Neg Hx    Esophageal cancer Neg Hx    Pancreatic cancer Neg Hx    Colon polyps Neg Hx     Social History Reviewed with no changes to be made today.   Outpatient Medications Prior to Visit  Medication Sig Dispense Refill   buPROPion  (WELLBUTRIN  XL) 150 MG 24 hr tablet Take 1 tablet (150 mg total) by mouth daily. For Smoking cessation 90 tablet 3   furosemide  (LASIX ) 40 MG tablet TAKE 1 TABLET BY MOUTH EVERY DAY 90 tablet 0   gabapentin  (NEURONTIN ) 300 MG capsule Take 2 capsules (600 mg total) by mouth 3 (three) times daily. 180 capsule 1   Iron , Ferrous  Sulfate, 325 (65 Fe) MG TABS Take 1 tablet by mouth in the morning and at bedtime. 60 tablet 6   methocarbamol  (ROBAXIN ) 500 MG tablet TAKE 2 TABLETS (1,000 MG TOTAL) BY MOUTH EVERY 8 (EIGHT) HOURS AS NEEDED. FOR MUSCLE PAIN 90 tablet 0   Misc. Devices MISC Shower Chair 1 each 0   Misc. Devices MISC Bedside Commode 1 each 0   Misc. Devices MISC Urinal 1 each 0   Misc. Devices MISC Adult pull-ups, underpads.  Diagnosis-urinary incontinence 1 each 11   pantoprazole  (PROTONIX ) 40 MG tablet Take 1 tablet (40 mg total) by mouth daily. 90 tablet 1   acetaminophen  (TYLENOL ) 650 MG CR tablet Take 1 tablet (650 mg total) by mouth every 8 (eight) hours as needed for pain. (Patient not taking: Reported on 10/18/2024) 60 tablet 0   albuterol  (VENTOLIN   HFA) 108 (90 Base) MCG/ACT inhaler Inhale 1-2 puffs into the lungs every 6 (six) hours as needed for wheezing or shortness of breath. (Patient not taking: Reported on 10/18/2024) 8 g 0   clindamycin  (CLEOCIN ) 300 MG capsule Take 300 mg by mouth 3 (three) times daily. (Patient not taking: Reported on 10/18/2024)     ferrous gluconate  (FERGON) 324 MG tablet Take 1 tablet (324 mg total) by mouth 2 (two) times daily with a meal. (Patient not taking: Reported on 10/18/2024) 60 tablet 3   folic acid  (FOLVITE ) 1 MG tablet Take 1 tablet (1 mg total) by mouth daily. (Patient not taking: Reported on 10/18/2024) 30 tablet 0   Humidifiers MISC 1 each by Does not apply route daily. (Patient not taking: Reported on 10/18/2024) 1 each 0   lactulose  (CHRONULAC ) 10 GM/15ML solution Take 30 mLs (20 g total) by mouth 2 (two) times daily. 1892 mL 2   naproxen  (NAPROSYN ) 500 MG tablet TAKE 1 TABLET BY MOUTH TWICE A DAY (Patient not taking: Reported on 10/18/2024) 30 tablet 0   propranolol  (INDERAL ) 10 MG tablet TAKE 1 TABLET BY MOUTH TWICE A DAY 60 tablet 3   spironolactone  (ALDACTONE ) 25 MG tablet Take 0.5 tablets (12.5 mg total) by mouth daily. (Patient not taking: Reported on 10/18/2024) 30  tablet 3   thiamine  (VITAMIN B1) 100 MG tablet Take 1 tablet (100 mg total) by mouth daily. (Patient not taking: Reported on 10/18/2024) 30 tablet 1   diclofenac  Sodium (VOLTAREN ) 1 % GEL Apply 2 g topically 4 (four) times daily as needed (pain). (Patient not taking: Reported on 06/25/2024)     fluticasone (FLONASE) 50 MCG/ACT nasal spray Place 1 spray into both nostrils daily as needed for allergies or rhinitis. (Patient not taking: Reported on 06/25/2024)     No facility-administered medications prior to visit.    Allergies[1]     Objective:    BP 125/76 (BP Location: Left Arm, Patient Position: Sitting, Cuff Size: Normal)   Pulse 76   Ht 6' (1.829 m)   Wt 131 lb 9.6 oz (59.7 kg)   SpO2 98%   BMI 17.85 kg/m  Wt Readings from Last 3 Encounters:  10/18/24 131 lb 9.6 oz (59.7 kg)  06/25/24 131 lb (59.4 kg)  02/22/24 140 lb (63.5 kg)    Physical Exam       Patient has been counseled extensively about nutrition and exercise as well as the importance of adherence with medications and regular follow-up. The patient was given clear instructions to go to ER or return to medical center if symptoms don't improve, worsen or new problems develop. The patient verbalized understanding.   Follow-up: No follow-ups on file.   Haze LELON Servant, FNP-BC [facility] and [facility] [place], [place] [phone]   10/18/2024, 2:49 PM    [1]  Allergies Allergen Reactions   Aspirin  Other (See Comments)    Caused acid reflux   Penicillins Hives    Has patient had a PCN reaction causing immediate rash, facial/tongue/throat swelling, SOB or lightheadedness with hypotension: yes Has patient had a PCN reaction causing severe rash involving mucus membranes or skin necrosis: no Has patient had a PCN reaction that required hospitalization: no Has patient had a PCN reaction occurring within the last 10 years: no If all of the above answers are NO, then may proceed with  Cephalosporin use.    "

## 2024-10-18 NOTE — Telephone Encounter (Signed)
 Requested medications are due for refill today.  unsure  Requested medications are on the active medications list.  no  Last refill. 05/29/2024  - 06/03/2024 #10 0 rf  Future visit scheduled.   no  Notes to clinic.  Refill not delegated. Med not on med list.    Requested Prescriptions  Pending Prescriptions Disp Refills   traMADol  (ULTRAM ) 50 MG tablet [Pharmacy Med Name: TRAMADOL  HCL 50 MG TABLET] 10 tablet 0    Sig: TAKE 1 TABLET BY MOUTH EVERY 12 HOURS AS NEEDED FOR UP TO 5 DAYS.     Not Delegated - Analgesics:  Opioid Agonists Failed - 10/18/2024  5:39 PM      Failed - This refill cannot be delegated      Failed - Urine Drug Screen completed in last 360 days      Failed - Valid encounter within last 3 months    Recent Outpatient Visits           Today Chronic bilateral low back pain with bilateral sciatica   [facility] [place]. [facility] Theotis Haze ORN, NP   3 months ago Alcoholic peripheral neuropathy   [facility] [place]. [facility] Vicci Barnie NOVAK, MD   8 months ago Epistaxis   [facility] [place]. [facility] [place], Jon M, [place]   10 months ago Alcoholic peripheral neuropathy   [facility] [place]. [facility] Delbert Clam, MD   11 months ago Other acute sinusitis, recurrence not specified    [facility] Of . [facility] Delbert Clam, MD

## 2024-10-18 NOTE — Patient Instructions (Addendum)
 [facility] Located in: [facility] MANO [facility] [address] Address: [address], [place], [place] [address] Phone: [phone]   Jerona ALONSO Sage, MD Orthopedic surgeon in [place], [place]  Address: [address] [place], [place] [address] Phone: [phone]

## 2024-10-22 ENCOUNTER — Telehealth: Payer: Self-pay

## 2024-10-22 NOTE — Telephone Encounter (Signed)
 Pharmacy Patient Advocate Encounter   Received notification from CoverMyMeds that prior authorization for LIDOCAINE  PATCH is required/requested.   Insurance verification completed.   The patient is insured through [facility] MEDICAID.   Per test claim: PA required; PA submitted to above mentioned insurance via CoverMyMeds Key/confirmation #/EOC [id number] Status is pending

## 2024-10-23 NOTE — Telephone Encounter (Signed)
 Pharmacy Patient Advocate Encounter  Received notification from [facility] MEDICAID that Prior Authorization for LIDOCAINE  PATCH has been APPROVED from 10/22/2024 to 10/22/2025   PA #/Case ID/Reference #: [id number]

## 2024-10-31 ENCOUNTER — Other Ambulatory Visit: Payer: Self-pay | Admitting: Family Medicine

## 2024-10-31 ENCOUNTER — Ambulatory Visit (HOSPITAL_COMMUNITY)

## 2024-10-31 DIAGNOSIS — S161XXA Strain of muscle, fascia and tendon at neck level, initial encounter: Secondary | ICD-10-CM

## 2024-11-04 ENCOUNTER — Ambulatory Visit: Admitting: Physical Medicine and Rehabilitation

## 2024-11-12 ENCOUNTER — Ambulatory Visit (HOSPITAL_COMMUNITY)

## 2024-11-19 ENCOUNTER — Ambulatory Visit: Admitting: Physical Medicine and Rehabilitation
